# Patient Record
Sex: Female | Born: 1945
Health system: Southern US, Community
[De-identification: ages and names within clinical notes are randomized; demographics above are authoritative.]

## PROBLEM LIST (undated history)

## (undated) DIAGNOSIS — I10 Essential (primary) hypertension: Secondary | ICD-10-CM

## (undated) DIAGNOSIS — I350 Nonrheumatic aortic (valve) stenosis: Secondary | ICD-10-CM

## (undated) DIAGNOSIS — I509 Heart failure, unspecified: Secondary | ICD-10-CM

## (undated) DIAGNOSIS — K253 Acute gastric ulcer without hemorrhage or perforation: Secondary | ICD-10-CM

## (undated) DIAGNOSIS — E785 Hyperlipidemia, unspecified: Secondary | ICD-10-CM

## (undated) DIAGNOSIS — N200 Calculus of kidney: Secondary | ICD-10-CM

## (undated) DIAGNOSIS — Q233 Congenital mitral insufficiency: Secondary | ICD-10-CM

## (undated) DIAGNOSIS — Z95810 Presence of automatic (implantable) cardiac defibrillator: Secondary | ICD-10-CM

## (undated) HISTORY — DX: Hyperlipidemia, unspecified: E78.5

## (undated) HISTORY — DX: Essential (primary) hypertension: I10

## (undated) HISTORY — DX: Calculus of kidney: N20.0

## (undated) HISTORY — DX: Nonrheumatic aortic (valve) stenosis: I35.0

## (undated) HISTORY — PX: INSERT / REPLACE / REMOVE PACEMAKER: SUR710

## (undated) HISTORY — DX: Heart failure, unspecified: I50.9

## (undated) HISTORY — DX: Congenital mitral insufficiency: Q23.3

## (undated) HISTORY — PX: ABDOMINAL HYSTERECTOMY: SHX81

---

## 1977-04-11 HISTORY — PX: APPENDECTOMY: SHX54

## 1997-09-22 ENCOUNTER — Emergency Department (HOSPITAL_COMMUNITY): Admission: EM | Admit: 1997-09-22 | Discharge: 1997-09-22 | Payer: Self-pay | Admitting: Emergency Medicine

## 1997-10-29 ENCOUNTER — Encounter: Admission: RE | Admit: 1997-10-29 | Discharge: 1997-10-29 | Payer: Self-pay | Admitting: Internal Medicine

## 1997-11-12 ENCOUNTER — Emergency Department (HOSPITAL_COMMUNITY): Admission: EM | Admit: 1997-11-12 | Discharge: 1997-11-12 | Payer: Self-pay | Admitting: *Deleted

## 1997-11-19 ENCOUNTER — Encounter: Admission: RE | Admit: 1997-11-19 | Discharge: 1997-11-19 | Payer: Self-pay | Admitting: Hematology and Oncology

## 1997-11-20 ENCOUNTER — Encounter: Admission: RE | Admit: 1997-11-20 | Discharge: 1997-11-20 | Payer: Self-pay | Admitting: Hematology and Oncology

## 1997-11-26 ENCOUNTER — Encounter: Payer: Self-pay | Admitting: Gastroenterology

## 1997-11-26 ENCOUNTER — Ambulatory Visit (HOSPITAL_COMMUNITY): Admission: RE | Admit: 1997-11-26 | Discharge: 1997-11-26 | Payer: Self-pay | Admitting: Gastroenterology

## 1997-12-11 ENCOUNTER — Ambulatory Visit (HOSPITAL_COMMUNITY): Admission: RE | Admit: 1997-12-11 | Discharge: 1997-12-11 | Payer: Self-pay | Admitting: Cardiology

## 1998-01-05 ENCOUNTER — Ambulatory Visit (HOSPITAL_COMMUNITY): Admission: RE | Admit: 1998-01-05 | Discharge: 1998-01-05 | Payer: Self-pay | Admitting: Cardiology

## 1998-02-23 ENCOUNTER — Encounter: Payer: Self-pay | Admitting: Emergency Medicine

## 1998-02-23 ENCOUNTER — Inpatient Hospital Stay (HOSPITAL_COMMUNITY): Admission: EM | Admit: 1998-02-23 | Discharge: 1998-03-01 | Payer: Self-pay | Admitting: Emergency Medicine

## 1998-03-10 ENCOUNTER — Ambulatory Visit (HOSPITAL_COMMUNITY): Admission: RE | Admit: 1998-03-10 | Discharge: 1998-03-10 | Payer: Self-pay | Admitting: Cardiology

## 1998-03-16 ENCOUNTER — Emergency Department (HOSPITAL_COMMUNITY): Admission: EM | Admit: 1998-03-16 | Discharge: 1998-03-16 | Payer: Self-pay | Admitting: Emergency Medicine

## 1998-03-27 ENCOUNTER — Encounter: Admission: RE | Admit: 1998-03-27 | Discharge: 1998-06-25 | Payer: Self-pay | Admitting: Internal Medicine

## 1999-01-20 ENCOUNTER — Ambulatory Visit (HOSPITAL_COMMUNITY): Admission: RE | Admit: 1999-01-20 | Discharge: 1999-01-20 | Payer: Self-pay | Admitting: *Deleted

## 1999-08-19 ENCOUNTER — Ambulatory Visit (HOSPITAL_COMMUNITY): Admission: RE | Admit: 1999-08-19 | Discharge: 1999-08-19 | Payer: Self-pay | Admitting: *Deleted

## 2000-09-02 ENCOUNTER — Emergency Department (HOSPITAL_COMMUNITY): Admission: EM | Admit: 2000-09-02 | Discharge: 2000-09-02 | Payer: Self-pay | Admitting: Emergency Medicine

## 2000-09-02 ENCOUNTER — Encounter: Payer: Self-pay | Admitting: Emergency Medicine

## 2000-09-05 ENCOUNTER — Encounter: Admission: RE | Admit: 2000-09-05 | Discharge: 2000-09-05 | Payer: Self-pay | Admitting: Internal Medicine

## 2000-09-05 ENCOUNTER — Ambulatory Visit (HOSPITAL_COMMUNITY): Admission: RE | Admit: 2000-09-05 | Discharge: 2000-09-05 | Payer: Self-pay | Admitting: Internal Medicine

## 2000-10-03 ENCOUNTER — Encounter: Admission: RE | Admit: 2000-10-03 | Discharge: 2000-10-03 | Payer: Self-pay | Admitting: Internal Medicine

## 2000-10-27 ENCOUNTER — Encounter: Admission: RE | Admit: 2000-10-27 | Discharge: 2000-10-27 | Payer: Self-pay | Admitting: Internal Medicine

## 2001-02-10 ENCOUNTER — Emergency Department (HOSPITAL_COMMUNITY): Admission: EM | Admit: 2001-02-10 | Discharge: 2001-02-10 | Payer: Self-pay

## 2001-06-03 ENCOUNTER — Emergency Department (HOSPITAL_COMMUNITY): Admission: EM | Admit: 2001-06-03 | Discharge: 2001-06-03 | Payer: Self-pay | Admitting: *Deleted

## 2002-01-30 ENCOUNTER — Encounter: Admission: RE | Admit: 2002-01-30 | Discharge: 2002-01-30 | Payer: Self-pay | Admitting: Internal Medicine

## 2002-03-25 ENCOUNTER — Encounter: Admission: RE | Admit: 2002-03-25 | Discharge: 2002-03-25 | Payer: Self-pay | Admitting: Internal Medicine

## 2002-05-22 ENCOUNTER — Encounter: Payer: Self-pay | Admitting: Emergency Medicine

## 2002-05-23 ENCOUNTER — Encounter: Payer: Self-pay | Admitting: Cardiology

## 2002-05-23 ENCOUNTER — Inpatient Hospital Stay (HOSPITAL_COMMUNITY): Admission: EM | Admit: 2002-05-23 | Discharge: 2002-05-24 | Payer: Self-pay | Admitting: Emergency Medicine

## 2002-05-23 ENCOUNTER — Encounter: Payer: Self-pay | Admitting: Internal Medicine

## 2002-06-19 ENCOUNTER — Ambulatory Visit (HOSPITAL_COMMUNITY): Admission: RE | Admit: 2002-06-19 | Discharge: 2002-06-19 | Payer: Self-pay | Admitting: Internal Medicine

## 2002-06-19 ENCOUNTER — Encounter: Payer: Self-pay | Admitting: Internal Medicine

## 2002-06-19 ENCOUNTER — Encounter: Admission: RE | Admit: 2002-06-19 | Discharge: 2002-06-19 | Payer: Self-pay | Admitting: Internal Medicine

## 2002-06-28 ENCOUNTER — Ambulatory Visit (HOSPITAL_COMMUNITY): Admission: RE | Admit: 2002-06-28 | Discharge: 2002-06-28 | Payer: Self-pay | Admitting: Internal Medicine

## 2002-07-12 ENCOUNTER — Encounter: Admission: RE | Admit: 2002-07-12 | Discharge: 2002-07-12 | Payer: Self-pay | Admitting: Internal Medicine

## 2002-08-07 ENCOUNTER — Ambulatory Visit (HOSPITAL_COMMUNITY): Admission: RE | Admit: 2002-08-07 | Discharge: 2002-08-07 | Payer: Self-pay | Admitting: Cardiology

## 2002-08-07 ENCOUNTER — Encounter: Payer: Self-pay | Admitting: Cardiology

## 2002-08-14 ENCOUNTER — Ambulatory Visit (HOSPITAL_COMMUNITY): Admission: RE | Admit: 2002-08-14 | Discharge: 2002-08-14 | Payer: Self-pay | Admitting: *Deleted

## 2003-01-13 ENCOUNTER — Encounter: Admission: RE | Admit: 2003-01-13 | Discharge: 2003-01-13 | Payer: Self-pay | Admitting: Internal Medicine

## 2003-01-15 ENCOUNTER — Encounter: Payer: Self-pay | Admitting: Internal Medicine

## 2003-01-15 ENCOUNTER — Ambulatory Visit (HOSPITAL_COMMUNITY): Admission: RE | Admit: 2003-01-15 | Discharge: 2003-01-15 | Payer: Self-pay | Admitting: Internal Medicine

## 2003-01-27 ENCOUNTER — Encounter: Admission: RE | Admit: 2003-01-27 | Discharge: 2003-01-27 | Payer: Self-pay | Admitting: Internal Medicine

## 2003-04-12 HISTORY — PX: CARDIAC VALVE SURGERY: SHX40

## 2004-01-24 ENCOUNTER — Inpatient Hospital Stay (HOSPITAL_COMMUNITY): Admission: EM | Admit: 2004-01-24 | Discharge: 2004-01-30 | Payer: Self-pay | Admitting: Emergency Medicine

## 2004-02-17 ENCOUNTER — Ambulatory Visit: Payer: Self-pay | Admitting: *Deleted

## 2004-02-25 ENCOUNTER — Ambulatory Visit: Payer: Self-pay | Admitting: *Deleted

## 2004-02-25 ENCOUNTER — Ambulatory Visit: Payer: Self-pay

## 2004-03-05 ENCOUNTER — Ambulatory Visit: Payer: Self-pay | Admitting: Internal Medicine

## 2004-03-09 ENCOUNTER — Ambulatory Visit: Payer: Self-pay | Admitting: *Deleted

## 2004-03-19 ENCOUNTER — Ambulatory Visit: Payer: Self-pay | Admitting: Internal Medicine

## 2004-03-19 ENCOUNTER — Ambulatory Visit: Payer: Self-pay | Admitting: *Deleted

## 2004-04-01 ENCOUNTER — Ambulatory Visit: Payer: Self-pay | Admitting: Cardiology

## 2004-04-13 ENCOUNTER — Ambulatory Visit: Payer: Self-pay | Admitting: *Deleted

## 2004-04-22 ENCOUNTER — Ambulatory Visit: Payer: Self-pay | Admitting: Internal Medicine

## 2004-04-27 ENCOUNTER — Ambulatory Visit: Payer: Self-pay | Admitting: Internal Medicine

## 2004-04-28 ENCOUNTER — Ambulatory Visit: Payer: Self-pay

## 2004-05-07 ENCOUNTER — Ambulatory Visit: Payer: Self-pay | Admitting: Cardiology

## 2004-05-21 ENCOUNTER — Ambulatory Visit: Payer: Self-pay | Admitting: Cardiology

## 2004-05-27 ENCOUNTER — Ambulatory Visit: Payer: Self-pay

## 2004-05-27 ENCOUNTER — Ambulatory Visit: Payer: Self-pay | Admitting: *Deleted

## 2004-06-03 ENCOUNTER — Ambulatory Visit: Payer: Self-pay

## 2004-06-04 ENCOUNTER — Ambulatory Visit: Payer: Self-pay | Admitting: Cardiology

## 2004-06-17 ENCOUNTER — Ambulatory Visit: Payer: Self-pay | Admitting: *Deleted

## 2004-06-25 ENCOUNTER — Ambulatory Visit: Payer: Self-pay | Admitting: *Deleted

## 2004-06-25 ENCOUNTER — Ambulatory Visit: Payer: Self-pay | Admitting: Cardiology

## 2004-06-29 ENCOUNTER — Ambulatory Visit: Payer: Self-pay | Admitting: Internal Medicine

## 2004-07-13 ENCOUNTER — Ambulatory Visit: Payer: Self-pay | Admitting: Cardiology

## 2004-07-27 ENCOUNTER — Ambulatory Visit: Payer: Self-pay | Admitting: Cardiology

## 2004-08-03 ENCOUNTER — Ambulatory Visit: Payer: Self-pay | Admitting: *Deleted

## 2004-08-09 ENCOUNTER — Ambulatory Visit: Payer: Self-pay | Admitting: Internal Medicine

## 2004-08-17 ENCOUNTER — Ambulatory Visit: Payer: Self-pay | Admitting: Cardiology

## 2004-09-07 ENCOUNTER — Ambulatory Visit: Payer: Self-pay | Admitting: *Deleted

## 2004-09-20 ENCOUNTER — Ambulatory Visit: Payer: Self-pay | Admitting: Cardiology

## 2004-10-11 ENCOUNTER — Ambulatory Visit: Payer: Self-pay | Admitting: Cardiology

## 2004-10-20 ENCOUNTER — Ambulatory Visit: Payer: Self-pay | Admitting: Cardiology

## 2004-11-01 ENCOUNTER — Ambulatory Visit: Payer: Self-pay | Admitting: Cardiology

## 2004-11-15 ENCOUNTER — Ambulatory Visit: Payer: Self-pay | Admitting: Cardiology

## 2004-11-29 ENCOUNTER — Ambulatory Visit: Payer: Self-pay | Admitting: Cardiology

## 2004-12-10 ENCOUNTER — Ambulatory Visit: Payer: Self-pay | Admitting: Cardiology

## 2004-12-24 ENCOUNTER — Ambulatory Visit: Payer: Self-pay | Admitting: Cardiology

## 2005-01-11 ENCOUNTER — Ambulatory Visit: Payer: Self-pay | Admitting: Internal Medicine

## 2005-01-25 ENCOUNTER — Ambulatory Visit: Payer: Self-pay | Admitting: Cardiology

## 2005-02-17 ENCOUNTER — Ambulatory Visit: Payer: Self-pay | Admitting: Cardiology

## 2005-03-01 ENCOUNTER — Ambulatory Visit: Payer: Self-pay | Admitting: Cardiology

## 2005-03-31 ENCOUNTER — Ambulatory Visit: Payer: Self-pay | Admitting: Cardiology

## 2005-04-09 ENCOUNTER — Inpatient Hospital Stay (HOSPITAL_COMMUNITY): Admission: EM | Admit: 2005-04-09 | Discharge: 2005-04-15 | Payer: Self-pay | Admitting: Emergency Medicine

## 2005-04-09 ENCOUNTER — Ambulatory Visit: Payer: Self-pay | Admitting: Cardiology

## 2005-04-13 ENCOUNTER — Encounter: Payer: Self-pay | Admitting: Cardiology

## 2005-04-29 ENCOUNTER — Ambulatory Visit: Payer: Self-pay | Admitting: Cardiology

## 2005-05-13 ENCOUNTER — Ambulatory Visit: Payer: Self-pay | Admitting: Cardiology

## 2005-05-20 ENCOUNTER — Ambulatory Visit: Payer: Self-pay | Admitting: Internal Medicine

## 2005-06-01 ENCOUNTER — Encounter: Admission: RE | Admit: 2005-06-01 | Discharge: 2005-06-01 | Payer: Self-pay | Admitting: Pulmonary Disease

## 2005-06-03 ENCOUNTER — Ambulatory Visit: Payer: Self-pay | Admitting: Internal Medicine

## 2005-06-28 ENCOUNTER — Ambulatory Visit: Payer: Self-pay | Admitting: Cardiology

## 2005-07-13 ENCOUNTER — Ambulatory Visit: Payer: Self-pay | Admitting: Cardiology

## 2005-07-27 ENCOUNTER — Ambulatory Visit: Payer: Self-pay | Admitting: *Deleted

## 2005-08-10 ENCOUNTER — Ambulatory Visit: Payer: Self-pay | Admitting: Cardiology

## 2005-08-10 ENCOUNTER — Encounter: Payer: Self-pay | Admitting: Cardiology

## 2005-08-24 ENCOUNTER — Ambulatory Visit: Payer: Self-pay | Admitting: Cardiology

## 2005-09-14 ENCOUNTER — Ambulatory Visit: Payer: Self-pay | Admitting: Cardiology

## 2005-10-13 ENCOUNTER — Ambulatory Visit: Payer: Self-pay | Admitting: Cardiology

## 2005-10-27 ENCOUNTER — Ambulatory Visit: Payer: Self-pay | Admitting: Cardiology

## 2005-11-04 ENCOUNTER — Emergency Department (HOSPITAL_COMMUNITY): Admission: EM | Admit: 2005-11-04 | Discharge: 2005-11-04 | Payer: Self-pay | Admitting: Emergency Medicine

## 2005-11-14 ENCOUNTER — Ambulatory Visit: Payer: Self-pay | Admitting: Internal Medicine

## 2005-11-28 ENCOUNTER — Ambulatory Visit: Payer: Self-pay | Admitting: Internal Medicine

## 2005-12-06 ENCOUNTER — Ambulatory Visit: Payer: Self-pay | Admitting: *Deleted

## 2006-01-02 ENCOUNTER — Ambulatory Visit: Payer: Self-pay | Admitting: Cardiology

## 2006-01-17 ENCOUNTER — Ambulatory Visit: Payer: Self-pay | Admitting: Internal Medicine

## 2006-01-31 ENCOUNTER — Ambulatory Visit: Payer: Self-pay | Admitting: Internal Medicine

## 2006-02-27 ENCOUNTER — Ambulatory Visit: Payer: Self-pay | Admitting: Cardiology

## 2006-03-27 ENCOUNTER — Ambulatory Visit (HOSPITAL_COMMUNITY): Admission: RE | Admit: 2006-03-27 | Discharge: 2006-03-27 | Payer: Self-pay | Admitting: Pulmonary Disease

## 2006-03-27 ENCOUNTER — Ambulatory Visit: Payer: Self-pay | Admitting: Cardiology

## 2006-04-14 ENCOUNTER — Ambulatory Visit: Payer: Self-pay | Admitting: Cardiology

## 2006-05-15 ENCOUNTER — Ambulatory Visit: Payer: Self-pay | Admitting: Cardiology

## 2006-06-12 ENCOUNTER — Ambulatory Visit: Payer: Self-pay | Admitting: Cardiovascular Disease

## 2006-06-17 ENCOUNTER — Inpatient Hospital Stay (HOSPITAL_COMMUNITY): Admission: EM | Admit: 2006-06-17 | Discharge: 2006-06-22 | Payer: Self-pay | Admitting: Emergency Medicine

## 2006-06-17 ENCOUNTER — Ambulatory Visit: Payer: Self-pay | Admitting: Internal Medicine

## 2006-06-27 ENCOUNTER — Inpatient Hospital Stay (HOSPITAL_COMMUNITY): Admission: AD | Admit: 2006-06-27 | Discharge: 2006-07-03 | Payer: Self-pay | Admitting: Orthopaedic Surgery

## 2006-08-17 ENCOUNTER — Ambulatory Visit: Payer: Self-pay | Admitting: Cardiology

## 2006-09-07 ENCOUNTER — Ambulatory Visit: Payer: Self-pay | Admitting: Cardiology

## 2006-10-05 ENCOUNTER — Ambulatory Visit: Payer: Self-pay | Admitting: Internal Medicine

## 2006-11-02 ENCOUNTER — Ambulatory Visit: Payer: Self-pay | Admitting: Cardiology

## 2006-11-16 ENCOUNTER — Ambulatory Visit: Payer: Self-pay | Admitting: Cardiovascular Disease

## 2006-12-08 ENCOUNTER — Inpatient Hospital Stay (HOSPITAL_COMMUNITY): Admission: EM | Admit: 2006-12-08 | Discharge: 2006-12-17 | Payer: Self-pay | Admitting: Family Medicine

## 2006-12-08 ENCOUNTER — Ambulatory Visit: Payer: Self-pay | Admitting: Cardiology

## 2006-12-13 ENCOUNTER — Encounter: Payer: Self-pay | Admitting: Internal Medicine

## 2006-12-13 ENCOUNTER — Ambulatory Visit: Payer: Self-pay | Admitting: Cardiovascular Disease

## 2006-12-24 ENCOUNTER — Emergency Department (HOSPITAL_COMMUNITY): Admission: EM | Admit: 2006-12-24 | Discharge: 2006-12-25 | Payer: Self-pay | Admitting: Emergency Medicine

## 2006-12-26 ENCOUNTER — Emergency Department (HOSPITAL_COMMUNITY): Admission: EM | Admit: 2006-12-26 | Discharge: 2006-12-26 | Payer: Self-pay | Admitting: Emergency Medicine

## 2006-12-27 ENCOUNTER — Ambulatory Visit: Payer: Self-pay | Admitting: Internal Medicine

## 2007-01-04 ENCOUNTER — Ambulatory Visit: Payer: Self-pay | Admitting: Internal Medicine

## 2007-01-17 ENCOUNTER — Ambulatory Visit: Payer: Self-pay | Admitting: Internal Medicine

## 2007-02-02 ENCOUNTER — Ambulatory Visit: Payer: Self-pay | Admitting: Cardiology

## 2007-02-14 ENCOUNTER — Ambulatory Visit: Payer: Self-pay | Admitting: Cardiology

## 2007-03-01 ENCOUNTER — Ambulatory Visit: Payer: Self-pay | Admitting: Cardiology

## 2007-03-01 LAB — CONVERTED CEMR LAB
CO2: 30 meq/L (ref 19–32)
Calcium: 8.8 mg/dL (ref 8.4–10.5)
Chloride: 105 meq/L (ref 96–112)
GFR calc Af Amer: 130 mL/min
Glucose, Bld: 92 mg/dL (ref 70–99)

## 2007-03-14 ENCOUNTER — Ambulatory Visit: Payer: Self-pay | Admitting: Cardiology

## 2007-03-23 ENCOUNTER — Ambulatory Visit: Payer: Self-pay | Admitting: Internal Medicine

## 2007-03-28 ENCOUNTER — Ambulatory Visit: Payer: Self-pay | Admitting: Internal Medicine

## 2007-03-28 LAB — CONVERTED CEMR LAB
CO2: 32 meq/L (ref 19–32)
Calcium: 9 mg/dL (ref 8.4–10.5)
Chloride: 104 meq/L (ref 96–112)
Creatinine, Ser: 0.8 mg/dL (ref 0.4–1.2)
Glucose, Bld: 106 mg/dL — ABNORMAL HIGH (ref 70–99)

## 2007-04-12 HISTORY — PX: CARDIAC DEFIBRILLATOR PLACEMENT: SHX171

## 2007-04-18 ENCOUNTER — Ambulatory Visit: Payer: Self-pay | Admitting: Cardiovascular Disease

## 2007-05-16 ENCOUNTER — Ambulatory Visit: Payer: Self-pay | Admitting: Internal Medicine

## 2007-06-12 ENCOUNTER — Ambulatory Visit: Payer: Self-pay | Admitting: Internal Medicine

## 2007-07-05 ENCOUNTER — Ambulatory Visit: Payer: Self-pay | Admitting: Internal Medicine

## 2007-07-12 ENCOUNTER — Ambulatory Visit: Payer: Self-pay | Admitting: Internal Medicine

## 2007-07-18 ENCOUNTER — Ambulatory Visit: Payer: Self-pay | Admitting: Internal Medicine

## 2007-07-18 LAB — CONVERTED CEMR LAB
BUN: 13 mg/dL (ref 6–23)
Basophils Relative: 0.2 % (ref 0.0–1.0)
Chloride: 103 meq/L (ref 96–112)
Creatinine, Ser: 0.9 mg/dL (ref 0.4–1.2)
Eosinophils Absolute: 0.1 10*3/uL (ref 0.0–0.7)
Eosinophils Relative: 1.1 % (ref 0.0–5.0)
GFR calc Af Amer: 81 mL/min
GFR calc non Af Amer: 67 mL/min
HCT: 34.7 % — ABNORMAL LOW (ref 36.0–46.0)
Hemoglobin: 11.1 g/dL — ABNORMAL LOW (ref 12.0–15.0)
MCV: 93.2 fL (ref 78.0–100.0)
Monocytes Absolute: 0.6 10*3/uL (ref 0.1–1.0)
RBC: 3.72 M/uL — ABNORMAL LOW (ref 3.87–5.11)
WBC: 9.7 10*3/uL (ref 4.5–10.5)

## 2007-07-20 ENCOUNTER — Ambulatory Visit (HOSPITAL_COMMUNITY): Admission: RE | Admit: 2007-07-20 | Discharge: 2007-07-21 | Payer: Self-pay | Admitting: Internal Medicine

## 2007-07-20 ENCOUNTER — Ambulatory Visit: Payer: Self-pay | Admitting: Internal Medicine

## 2007-07-27 ENCOUNTER — Ambulatory Visit: Payer: Self-pay | Admitting: Internal Medicine

## 2007-08-02 ENCOUNTER — Ambulatory Visit: Payer: Self-pay

## 2007-08-17 ENCOUNTER — Ambulatory Visit: Payer: Self-pay | Admitting: Cardiology

## 2007-09-07 ENCOUNTER — Ambulatory Visit: Payer: Self-pay | Admitting: Cardiology

## 2007-09-21 ENCOUNTER — Ambulatory Visit: Payer: Self-pay | Admitting: Cardiovascular Disease

## 2007-09-27 ENCOUNTER — Emergency Department (HOSPITAL_COMMUNITY): Admission: EM | Admit: 2007-09-27 | Discharge: 2007-09-27 | Payer: Self-pay | Admitting: Emergency Medicine

## 2007-10-19 ENCOUNTER — Ambulatory Visit: Payer: Self-pay | Admitting: Cardiovascular Disease

## 2007-11-16 ENCOUNTER — Ambulatory Visit: Payer: Self-pay | Admitting: Cardiology

## 2007-11-26 ENCOUNTER — Ambulatory Visit: Payer: Self-pay | Admitting: Cardiology

## 2007-12-19 ENCOUNTER — Ambulatory Visit: Payer: Self-pay | Admitting: Internal Medicine

## 2007-12-19 ENCOUNTER — Ambulatory Visit: Payer: Self-pay

## 2007-12-19 ENCOUNTER — Encounter: Payer: Self-pay | Admitting: Internal Medicine

## 2008-01-02 ENCOUNTER — Ambulatory Visit: Payer: Self-pay | Admitting: Cardiology

## 2008-01-23 ENCOUNTER — Ambulatory Visit: Payer: Self-pay | Admitting: Cardiology

## 2008-01-24 DIAGNOSIS — I059 Rheumatic mitral valve disease, unspecified: Secondary | ICD-10-CM

## 2008-01-24 DIAGNOSIS — I10 Essential (primary) hypertension: Secondary | ICD-10-CM

## 2008-01-24 DIAGNOSIS — I5022 Chronic systolic (congestive) heart failure: Secondary | ICD-10-CM

## 2008-02-11 ENCOUNTER — Ambulatory Visit: Payer: Self-pay | Admitting: Cardiovascular Disease

## 2008-03-10 ENCOUNTER — Ambulatory Visit: Payer: Self-pay | Admitting: Cardiovascular Disease

## 2008-03-18 ENCOUNTER — Ambulatory Visit: Payer: Self-pay | Admitting: Internal Medicine

## 2008-04-08 ENCOUNTER — Ambulatory Visit: Payer: Self-pay | Admitting: Cardiovascular Disease

## 2008-04-30 ENCOUNTER — Encounter: Admission: RE | Admit: 2008-04-30 | Discharge: 2008-04-30 | Payer: Self-pay | Admitting: Pulmonary Disease

## 2008-05-06 ENCOUNTER — Ambulatory Visit: Payer: Self-pay | Admitting: Cardiology

## 2008-05-20 ENCOUNTER — Ambulatory Visit: Payer: Self-pay | Admitting: Cardiology

## 2008-06-03 ENCOUNTER — Ambulatory Visit: Payer: Self-pay | Admitting: Cardiovascular Disease

## 2008-06-17 ENCOUNTER — Encounter: Payer: Self-pay | Admitting: Internal Medicine

## 2008-06-20 ENCOUNTER — Ambulatory Visit: Payer: Self-pay | Admitting: Cardiovascular Disease

## 2008-07-16 ENCOUNTER — Ambulatory Visit: Payer: Self-pay

## 2008-07-30 ENCOUNTER — Ambulatory Visit: Payer: Self-pay | Admitting: Internal Medicine

## 2008-07-30 ENCOUNTER — Encounter: Payer: Self-pay | Admitting: Internal Medicine

## 2008-07-30 DIAGNOSIS — I359 Nonrheumatic aortic valve disorder, unspecified: Secondary | ICD-10-CM | POA: Insufficient documentation

## 2008-08-06 ENCOUNTER — Ambulatory Visit: Payer: Self-pay | Admitting: Cardiology

## 2008-08-22 ENCOUNTER — Ambulatory Visit: Payer: Self-pay | Admitting: Cardiology

## 2008-09-09 ENCOUNTER — Encounter: Payer: Self-pay | Admitting: *Deleted

## 2008-10-09 ENCOUNTER — Ambulatory Visit: Payer: Self-pay | Admitting: Cardiology

## 2008-10-09 ENCOUNTER — Encounter: Payer: Self-pay | Admitting: Internal Medicine

## 2008-10-09 ENCOUNTER — Ambulatory Visit: Payer: Self-pay

## 2008-10-09 LAB — CONVERTED CEMR LAB: POC INR: 2.7

## 2008-10-15 ENCOUNTER — Encounter: Payer: Self-pay | Admitting: *Deleted

## 2008-10-28 ENCOUNTER — Encounter: Payer: Self-pay | Admitting: Internal Medicine

## 2008-11-06 ENCOUNTER — Ambulatory Visit: Payer: Self-pay | Admitting: Cardiovascular Disease

## 2008-11-06 LAB — CONVERTED CEMR LAB
POC INR: 3.1
Prothrombin Time: 21.4 s

## 2008-12-04 ENCOUNTER — Ambulatory Visit: Payer: Self-pay | Admitting: Cardiovascular Disease

## 2008-12-04 LAB — CONVERTED CEMR LAB: POC INR: 3.8

## 2008-12-29 ENCOUNTER — Ambulatory Visit: Payer: Self-pay | Admitting: Cardiology

## 2008-12-29 ENCOUNTER — Encounter: Payer: Self-pay | Admitting: Internal Medicine

## 2008-12-29 LAB — CONVERTED CEMR LAB: Prothrombin Time: 96.8 s (ref 9.1–11.7)

## 2009-01-01 ENCOUNTER — Ambulatory Visit: Payer: Self-pay | Admitting: Internal Medicine

## 2009-01-08 ENCOUNTER — Ambulatory Visit: Payer: Self-pay | Admitting: Internal Medicine

## 2009-01-12 ENCOUNTER — Ambulatory Visit: Payer: Self-pay

## 2009-01-12 ENCOUNTER — Encounter: Payer: Self-pay | Admitting: Internal Medicine

## 2009-01-16 ENCOUNTER — Ambulatory Visit: Payer: Self-pay | Admitting: Internal Medicine

## 2009-01-16 LAB — CONVERTED CEMR LAB: POC INR: 4.4

## 2009-01-23 ENCOUNTER — Ambulatory Visit: Payer: Self-pay | Admitting: Internal Medicine

## 2009-02-13 ENCOUNTER — Ambulatory Visit: Payer: Self-pay | Admitting: Cardiovascular Disease

## 2009-02-13 LAB — CONVERTED CEMR LAB: POC INR: 3.4

## 2009-03-13 ENCOUNTER — Ambulatory Visit: Payer: Self-pay | Admitting: Cardiovascular Disease

## 2009-03-13 LAB — CONVERTED CEMR LAB: POC INR: 2.3

## 2009-04-02 ENCOUNTER — Ambulatory Visit: Payer: Self-pay | Admitting: Cardiology

## 2009-04-02 LAB — CONVERTED CEMR LAB: POC INR: 2

## 2009-04-14 ENCOUNTER — Ambulatory Visit: Payer: Self-pay | Admitting: Internal Medicine

## 2009-04-14 ENCOUNTER — Ambulatory Visit: Payer: Self-pay | Admitting: Cardiology

## 2009-04-28 ENCOUNTER — Ambulatory Visit: Payer: Self-pay | Admitting: Cardiology

## 2009-05-27 ENCOUNTER — Ambulatory Visit: Payer: Self-pay | Admitting: Internal Medicine

## 2009-06-09 ENCOUNTER — Encounter (INDEPENDENT_AMBULATORY_CARE_PROVIDER_SITE_OTHER): Payer: Self-pay | Admitting: *Deleted

## 2009-06-23 ENCOUNTER — Ambulatory Visit (HOSPITAL_COMMUNITY): Admission: RE | Admit: 2009-06-23 | Discharge: 2009-06-23 | Payer: Self-pay | Admitting: Pulmonary Disease

## 2009-06-24 ENCOUNTER — Ambulatory Visit: Payer: Self-pay | Admitting: Internal Medicine

## 2009-06-24 LAB — CONVERTED CEMR LAB: POC INR: 1.7

## 2009-06-29 ENCOUNTER — Ambulatory Visit (HOSPITAL_COMMUNITY): Admission: RE | Admit: 2009-06-29 | Discharge: 2009-06-29 | Payer: Self-pay | Admitting: Pulmonary Disease

## 2009-07-08 ENCOUNTER — Ambulatory Visit: Payer: Self-pay | Admitting: Cardiovascular Disease

## 2009-07-10 ENCOUNTER — Encounter (INDEPENDENT_AMBULATORY_CARE_PROVIDER_SITE_OTHER): Payer: Self-pay | Admitting: *Deleted

## 2009-07-15 ENCOUNTER — Ambulatory Visit: Payer: Self-pay

## 2009-07-15 ENCOUNTER — Encounter: Payer: Self-pay | Admitting: Internal Medicine

## 2009-07-17 ENCOUNTER — Ambulatory Visit: Payer: Self-pay | Admitting: Cardiology

## 2009-08-03 ENCOUNTER — Ambulatory Visit: Payer: Self-pay | Admitting: Cardiovascular Disease

## 2009-08-18 ENCOUNTER — Ambulatory Visit: Payer: Self-pay | Admitting: Internal Medicine

## 2009-08-18 DIAGNOSIS — I5023 Acute on chronic systolic (congestive) heart failure: Secondary | ICD-10-CM | POA: Insufficient documentation

## 2009-08-24 ENCOUNTER — Ambulatory Visit: Payer: Self-pay | Admitting: Cardiovascular Disease

## 2009-08-24 LAB — CONVERTED CEMR LAB: POC INR: 2.9

## 2009-09-04 ENCOUNTER — Encounter: Payer: Self-pay | Admitting: Internal Medicine

## 2009-09-04 ENCOUNTER — Ambulatory Visit: Payer: Self-pay | Admitting: Cardiovascular Disease

## 2009-09-04 ENCOUNTER — Ambulatory Visit: Payer: Self-pay

## 2009-09-04 ENCOUNTER — Ambulatory Visit (HOSPITAL_COMMUNITY): Admission: RE | Admit: 2009-09-04 | Discharge: 2009-09-04 | Payer: Self-pay | Admitting: Internal Medicine

## 2009-09-04 ENCOUNTER — Ambulatory Visit: Payer: Self-pay | Admitting: Internal Medicine

## 2009-09-09 ENCOUNTER — Ambulatory Visit: Payer: Self-pay | Admitting: Internal Medicine

## 2009-09-09 ENCOUNTER — Ambulatory Visit: Payer: Self-pay | Admitting: Cardiology

## 2009-09-09 LAB — CONVERTED CEMR LAB
CO2: 30 meq/L (ref 19–32)
Chloride: 101 meq/L (ref 96–112)
Creatinine, Ser: 0.9 mg/dL (ref 0.4–1.2)
POC INR: 3
Potassium: 4 meq/L (ref 3.5–5.1)
Sodium: 141 meq/L (ref 135–145)

## 2009-09-23 ENCOUNTER — Ambulatory Visit: Payer: Self-pay | Admitting: Internal Medicine

## 2009-09-28 LAB — CONVERTED CEMR LAB
CO2: 31 meq/L (ref 19–32)
Calcium: 9.5 mg/dL (ref 8.4–10.5)
Chloride: 105 meq/L (ref 96–112)
GFR calc non Af Amer: 90.97 mL/min (ref >60)
Glucose, Bld: 150 mg/dL — ABNORMAL HIGH (ref 70–99)
Potassium: 4.6 meq/L (ref 3.5–5.1)
Pro B Natriuretic peptide (BNP): 38 pg/mL (ref 0.0–100.0)
Sodium: 143 meq/L (ref 135–145)

## 2009-09-29 ENCOUNTER — Ambulatory Visit (HOSPITAL_COMMUNITY): Admission: RE | Admit: 2009-09-29 | Discharge: 2009-09-29 | Payer: Self-pay | Admitting: Internal Medicine

## 2009-09-29 ENCOUNTER — Ambulatory Visit: Payer: Self-pay | Admitting: Internal Medicine

## 2009-10-07 ENCOUNTER — Ambulatory Visit: Payer: Self-pay | Admitting: Cardiology

## 2009-10-15 ENCOUNTER — Ambulatory Visit: Payer: Self-pay | Admitting: Internal Medicine

## 2009-10-15 DIAGNOSIS — R0902 Hypoxemia: Secondary | ICD-10-CM | POA: Insufficient documentation

## 2009-10-19 ENCOUNTER — Ambulatory Visit: Payer: Self-pay | Admitting: Internal Medicine

## 2009-10-20 ENCOUNTER — Encounter: Payer: Self-pay | Admitting: Internal Medicine

## 2009-10-21 ENCOUNTER — Ambulatory Visit: Payer: Self-pay | Admitting: Internal Medicine

## 2009-10-30 ENCOUNTER — Telehealth: Payer: Self-pay | Admitting: Internal Medicine

## 2009-11-03 ENCOUNTER — Ambulatory Visit: Payer: Self-pay | Admitting: Cardiology

## 2009-11-03 ENCOUNTER — Ambulatory Visit: Payer: Self-pay | Admitting: Internal Medicine

## 2009-11-19 ENCOUNTER — Ambulatory Visit: Payer: Self-pay | Admitting: Pulmonary Disease

## 2009-11-19 DIAGNOSIS — J449 Chronic obstructive pulmonary disease, unspecified: Secondary | ICD-10-CM

## 2009-11-19 DIAGNOSIS — J4489 Other specified chronic obstructive pulmonary disease: Secondary | ICD-10-CM | POA: Insufficient documentation

## 2009-11-24 ENCOUNTER — Ambulatory Visit: Payer: Self-pay | Admitting: Cardiology

## 2009-12-22 ENCOUNTER — Ambulatory Visit: Payer: Self-pay | Admitting: Cardiovascular Disease

## 2010-01-08 ENCOUNTER — Emergency Department (HOSPITAL_COMMUNITY): Admission: EM | Admit: 2010-01-08 | Discharge: 2010-01-09 | Payer: Self-pay | Admitting: Emergency Medicine

## 2010-01-12 ENCOUNTER — Ambulatory Visit: Payer: Self-pay | Admitting: Internal Medicine

## 2010-02-01 ENCOUNTER — Encounter: Payer: Self-pay | Admitting: Internal Medicine

## 2010-02-01 ENCOUNTER — Ambulatory Visit: Payer: Self-pay

## 2010-02-09 ENCOUNTER — Ambulatory Visit: Payer: Self-pay | Admitting: Cardiovascular Disease

## 2010-02-09 LAB — CONVERTED CEMR LAB: POC INR: 3.2

## 2010-02-23 ENCOUNTER — Ambulatory Visit: Payer: Self-pay | Admitting: Internal Medicine

## 2010-03-09 ENCOUNTER — Ambulatory Visit: Payer: Self-pay | Admitting: Cardiology

## 2010-03-09 LAB — CONVERTED CEMR LAB: POC INR: 2.5

## 2010-04-09 ENCOUNTER — Ambulatory Visit: Payer: Self-pay | Admitting: Cardiovascular Disease

## 2010-04-09 LAB — CONVERTED CEMR LAB: POC INR: 2.3

## 2010-04-26 ENCOUNTER — Ambulatory Visit (HOSPITAL_COMMUNITY)
Admission: RE | Admit: 2010-04-26 | Discharge: 2010-04-26 | Payer: Self-pay | Source: Home / Self Care | Attending: Pulmonary Disease | Admitting: Pulmonary Disease

## 2010-04-28 ENCOUNTER — Ambulatory Visit (HOSPITAL_COMMUNITY)
Admission: RE | Admit: 2010-04-28 | Discharge: 2010-04-28 | Payer: Self-pay | Source: Home / Self Care | Attending: Pulmonary Disease | Admitting: Pulmonary Disease

## 2010-04-30 ENCOUNTER — Ambulatory Visit: Admission: RE | Admit: 2010-04-30 | Discharge: 2010-04-30 | Payer: Self-pay | Source: Home / Self Care

## 2010-05-02 ENCOUNTER — Encounter: Payer: Self-pay | Admitting: Pulmonary Disease

## 2010-05-06 ENCOUNTER — Ambulatory Visit: Admission: RE | Admit: 2010-05-06 | Discharge: 2010-05-06 | Payer: Self-pay | Source: Home / Self Care

## 2010-05-06 ENCOUNTER — Encounter: Payer: Self-pay | Admitting: Internal Medicine

## 2010-05-12 ENCOUNTER — Emergency Department (HOSPITAL_COMMUNITY): Payer: No Typology Code available for payment source

## 2010-05-12 ENCOUNTER — Emergency Department (HOSPITAL_COMMUNITY)
Admission: EM | Admit: 2010-05-12 | Discharge: 2010-05-12 | Disposition: A | Discharge status: Home / Self Care | Payer: No Typology Code available for payment source | Attending: Emergency Medicine | Admitting: Emergency Medicine

## 2010-05-12 DIAGNOSIS — R6883 Chills (without fever): Secondary | ICD-10-CM | POA: Insufficient documentation

## 2010-05-12 DIAGNOSIS — R05 Cough: Secondary | ICD-10-CM | POA: Insufficient documentation

## 2010-05-12 DIAGNOSIS — R6889 Other general symptoms and signs: Secondary | ICD-10-CM | POA: Insufficient documentation

## 2010-05-12 DIAGNOSIS — R1110 Vomiting, unspecified: Secondary | ICD-10-CM | POA: Insufficient documentation

## 2010-05-12 DIAGNOSIS — B9789 Other viral agents as the cause of diseases classified elsewhere: Secondary | ICD-10-CM | POA: Insufficient documentation

## 2010-05-12 DIAGNOSIS — R059 Cough, unspecified: Secondary | ICD-10-CM | POA: Insufficient documentation

## 2010-05-13 NOTE — Medication Information (Signed)
Summary: rov/ewj  Anticoagulant Therapy  Managed by: Weston Brass, PharmD Referring MD: Charlton Haws MD PCP: Corine Shelter Supervising MD: Myrtis Ser MD, Tinnie Gens Indication 1: Mitral Valve Replacement (ICD-V43.3) Indication 2: Mitral Valve Disorder (ICD-424.0) Lab Used: [facility] Site: [facility] INR POC 2.5 INR RANGE 2.5 - 3.5  Dietary changes: no    Health status changes: no    Bleeding/hemorrhagic complications: no    Recent/future hospitalizations: no    Any changes in medication regimen? no    Recent/future dental: no  Any missed doses?: no       Is patient compliant with meds? yes       Allergies: No Known Drug Allergies  Anticoagulation Management History:      The patient is taking warfarin and comes in today for a routine follow up visit.  Positive risk factors for bleeding include an age of 67 years or older.  The bleeding index is 'intermediate risk'.  Positive CHADS2 values include History of CHF and History of HTN.  Negative CHADS2 values include Age > 67 years old.  The start date was 11/29/2003.  Her last INR was 9.5 ratio.  Anticoagulation responsible provider: Myrtis Ser MD, Tinnie Gens.  INR POC: 2.5.  Cuvette Lot#: [id number].  Exp: 03/2011.    Anticoagulation Management Assessment/Plan:      The patient's current anticoagulation dose is Warfarin sodium 10 mg tabs: Take as directed by coumadin clinic..  The target INR is 2.5 - 3.5.  The next INR is due 04/09/2010.  Anticoagulation instructions were given to patient.  Results were reviewed/authorized by Weston Brass, PharmD.  She was notified by Hoy Register, PharmD Candidate.         Prior Anticoagulation Instructions: INR 3.2  Continue on same dosage 1 tablet daily except 1/2 tablet on Tuesdays, Thursdays, and Saturdays.  Recheck in 4 weeks.    Current Anticoagulation Instructions: INR 2.5 Continue previous dose of 1 tablet everyday except 0.5 tablet on Tuesday, Thursday, and Saturday Recheck INR in 4 weeks

## 2010-05-13 NOTE — Assessment & Plan Note (Signed)
Summary: pc2   Primary Provider:  Corine Shelter   History of Present Illness: [name] is a very pleasant 65 year old woman with a history of congestive heart failure, secondary to nonischemic cardiomyopathy, ejection fraction around 25% s/p St Jude single chamber ICD.    She also has a history of mitral valve disease and status post mitral valve replacement with a St. Jude valve in 2005.    She has no complaints today, specifically without edema , sob, or chest pain  Current Medications (verified): 1)  Carvedilol 25 Mg Tabs (Carvedilol) .... Take 1 Tablet By Mouth Twice A Day 2)  Quinapril Hcl 40 Mg Tabs (Quinapril Hcl) .... Take 1 Tablet By Mouth Once A Day 3)  Digoxin 0.125 Mg Tabs (Digoxin) .Marland Kitchen.. 1 Once Daily 4)  Metformin Hcl 1000 Mg Tabs (Metformin Hcl) .Marland Kitchen.. 1 Two Times A Day 5)  Warfarin Sodium 10 Mg Tabs (Warfarin Sodium) .... Take As Directed By Coumadin Clinic. 6)  Furosemide 40 Mg Tabs (Furosemide) .... Take One Tablet By Mouth Daily As Needed 7)  Hydrocodone-Acetaminophen 5-500 Mg Tabs (Hydrocodone-Acetaminophen) .... As Needed 8)  Lipitor 80 Mg Tabs (Atorvastatin Calcium) .... Take 1 Tablet By Mouth Once A Day 9)  Trilipix 135 Mg Cpdr (Choline Fenofibrate) .... Take 1 Tablet By Mouth Once A Day 10)  Spironolactone 25 Mg Tabs (Spironolactone) .... Take1/2 Tablet By Mouth Daily 11)  Fish Oil 1000 Mg Caps (Omega-3 Fatty Acids) .... Take 1 Capsule By Mouth Once A Day  Allergies (verified): No Known Drug Allergies  Past History:  Past Medical History: Last updated: 09/09/2009 Congestive Heart Failure     --Non-ischemic CM EF 30-35% by echo 9/09 (Previous 25%)     -- s/p St. Jude single chamber ICD 4/09     --Minimal CAD by cath 2004 LAD 20% LCX 20% Ramus 30% RCA nl     --echo 5/11  EF 20-25% Severe MR s/p St. Jude mechanical MVR by Gasper Lloyd 2005 Aortic Stenosis (mild) mean gradient echo 9/09 Diabetes Type 2 Kidney  Stones Hyperlipidemia Hypertension  Past Surgical History: Last updated: 04/10/2009 Appendectomy AICD implantation-2009  Social History: Last updated: 04/10/2009 Retired  Tobacco Use - Former. -remote hx  Vital Signs:  Patient profile:   65 year old female Height:      63 inches Weight:      178 pounds BMI:     31.65 Pulse rate:   88 / minute Pulse rhythm:   regular BP sitting:   155 / 88  (left arm) Cuff size:   large  Vitals Entered By: Judithe Modest CMA (November 03, 2009 12:00 PM)  Physical Exam  General:  The patient was alert and oriented in no acute distress.    HEENT Normal Neck: JVP flat carotids were brisk. no bruits Lungs were clear.  PMI laterally displaced Heart sounds were regular with mechanical S1 with 2/6 systolic murmur at RSB  Abdomen was soft with active bowel sounds. There is no clubbing cyanosis or edema. Skin Warm and dry Neuro  oriented x 3  cranial nerves II-XII grossly intact. moves all 4 extremities without difficulty.    ICD Specifications Following MD:  Sherryl Manges, MD     Referring MD:  [facility] ICD Vendor:  St Jude     ICD Model Number:  [id number]     ICD Serial Number:  [id number] ICD DOI:  07/20/2007     ICD Implanting MD:  Sherryl Manges, MD  Lead 1:  Location: [facility]     DOI: 07/20/2007     Model #: 7120     Serial #: [id number]     Status: active  Indications::  NICM   ICD Follow Up Remote Check?  No Battery Voltage:  3.20 V     Charge Time:  11.3 seconds     Battery Est. Longevity:  7.3 years Underlying rhythm:  SR ICD Dependent:  No       ICD Device Measurements Right Ventricle:  Amplitude: 12 mV, Impedance: 530 ohms, Threshold: 0.5 V at 0.5 msec Shock Impedance: 48 ohms   Episodes Coumadin:  Yes Shock:  0     ATP:  0     Nonsustained:  0     Ventricular Pacing:  <1%  Brady Parameters Mode VVI     Lower Rate Limit:  40      Tachy Zones VF:  240     VT:  210     VT1:  171     Next Cardiology Appt Due:  01/09/2010 Tech  Comments:  No parameter changes.  Device function normal.  Checked by industry.  ROV 3 months clinic. Altha Harm, LPN  November 03, 2009 12:09 PM   Impression & Recommendations:  Problem # 1:  IMPLANTATION OF DEFIBRILLATOR, ST JUDE CURRENT 1207 (ICD-V45.02) Device parameters and data were reviewed and no changes were made  Problem # 2:  SYSTOLIC HEART FAILURE, CHRONIC (ICD-428.22) stable Her updated medication list for this problem includes:    Carvedilol 25 Mg Tabs (Carvedilol) .Marland Kitchen... Take 1 tablet by mouth twice a day    Quinapril Hcl 40 Mg Tabs (Quinapril hcl) .Marland Kitchen... Take 1 tablet by mouth once a day    Digoxin 0.125 Mg Tabs (Digoxin) .Marland Kitchen... 1 once daily    Warfarin Sodium 10 Mg Tabs (Warfarin sodium) .Marland Kitchen... Take as directed by coumadin clinic.    Furosemide 40 Mg Tabs (Furosemide) .Marland Kitchen... Take one tablet by mouth daily as needed    Spironolactone 25 Mg Tabs (Spironolactone) .Marland Kitchen... Take1/2 tablet by mouth daily  Problem # 3:  MITRAL VALVE DISORDERS (ICD-424.0) stable cardiomyopathy Her updated medication list for this problem includes:    Carvedilol 25 Mg Tabs (Carvedilol) .Marland Kitchen... Take 1 tablet by mouth twice a day    Quinapril Hcl 40 Mg Tabs (Quinapril hcl) .Marland Kitchen... Take 1 tablet by mouth once a day    Digoxin 0.125 Mg Tabs (Digoxin) .Marland Kitchen... 1 once daily    Furosemide 40 Mg Tabs (Furosemide) .Marland Kitchen... Take one tablet by mouth daily as needed    Spironolactone 25 Mg Tabs (Spironolactone) .Marland Kitchen... Take1/2 tablet by mouth daily  Patient Instructions: 1)  Your physician recommends that you schedule a follow-up appointment in: 3 months with device clinic.

## 2010-05-13 NOTE — Medication Information (Signed)
Summary: rov/sel  Anticoagulant Therapy  Managed by: Cloyde Reams, RN, BSN Referring MD: Charlton Haws MD PCP: Corine Shelter Supervising MD: Clifton James MD, Cristal Deer Indication 1: Mitral Valve Replacement (ICD-V43.3) Indication 2: Mitral Valve Disorder (ICD-424.0) Lab Used: [facility] Site: [facility] INR POC 3.2 INR RANGE 2.5 - 3.5  Dietary changes: no    Health status changes: no    Bleeding/hemorrhagic complications: no    Recent/future hospitalizations: no    Any changes in medication regimen? no    Recent/future dental: no  Any missed doses?: no       Is patient compliant with meds? yes       Allergies: No Known Drug Allergies  Anticoagulation Management History:      The patient is taking warfarin and comes in today for a routine follow up visit.  Positive risk factors for bleeding include an age of 67 years or older.  The bleeding index is 'intermediate risk'.  Positive CHADS2 values include History of CHF and History of HTN.  Negative CHADS2 values include Age > 55 years old.  The start date was 11/29/2003.  Her last INR was 9.5 ratio.  Anticoagulation responsible provider: Clifton James MD, Cristal Deer.  INR POC: 3.2.  Cuvette Lot#: [id number].  Exp: 02/2011.    Anticoagulation Management Assessment/Plan:      The patient's current anticoagulation dose is Warfarin sodium 10 mg tabs: Take as directed by coumadin clinic..  The target INR is 2.5 - 3.5.  The next INR is due 03/09/2010.  Anticoagulation instructions were given to patient.  Results were reviewed/authorized by Cloyde Reams, RN, BSN.  She was notified by Cloyde Reams RN.         Prior Anticoagulation Instructions: INR 2.9  Patient will continue to take 1 tablet everyday except 1/2 tablet on Tuesday, Thursday, and Saturday. Recheck in 4 weeks.  Current Anticoagulation Instructions: INR 3.2  Continue on same dosage 1 tablet daily except 1/2 tablet on Tuesdays, Thursdays, and Saturdays.  Recheck in 4  weeks.

## 2010-05-13 NOTE — Medication Information (Signed)
Summary: rov/eac  Anticoagulant Therapy  Managed by: Bethena Midget, RN, BSN Referring MD: Charlton Haws MD PCP: Corine Shelter Supervising MD: Daleen Squibb MD, Maisie Fus Indication 1: Mitral Valve Replacement (ICD-V43.3) Indication 2: Mitral Valve Disorder (ICD-424.0) Lab Used: [facility] Site: [facility] INR POC 2.2 INR RANGE 2.5 - 3.5  Dietary changes: no    Health status changes: no    Bleeding/hemorrhagic complications: no    Recent/future hospitalizations: no    Any changes in medication regimen? no    Recent/future dental: no  Any missed doses?: no       Is patient compliant with meds? yes      Comments: Pt. was taking dose as it was printed out on her sheet which was 5mg s daily except 10mg s on MWF.   Allergies: No Known Drug Allergies  Anticoagulation Management History:      The patient is taking warfarin and comes in today for a routine follow up visit.  Positive risk factors for bleeding include an age of 19 years or older.  The bleeding index is 'intermediate risk'.  Positive CHADS2 values include History of CHF and History of HTN.  Negative CHADS2 values include Age > 10 years old.  The start date was 11/29/2003.  Her last INR was 9.5 ratio.  Anticoagulation responsible provider: Daleen Squibb MD, Maisie Fus.  INR POC: 2.2.  Cuvette Lot#: [id number].  Exp: 08/2010.    Anticoagulation Management Assessment/Plan:      The patient's current anticoagulation dose is Warfarin sodium 10 mg tabs: Take as directed by coumadin clinic..  The target INR is 2.5 - 3.5.  The next INR is due 07/31/2009.  Anticoagulation instructions were given to patient.  Results were reviewed/authorized by Bethena Midget, RN, BSN.  She was notified by Bethena Midget, RN, BSN.         Prior Anticoagulation Instructions: INR 1.3  Take 2 tablets today.  Then start new dosing schedule of 1/2 tablet on Tuesday, Thursday, and Saturday, and 1 tablet all other days.  Return to clinic in 10 days.    Current Anticoagulation  Instructions: INR 2.2 Change dose to 10mg s on Mondays, Wednesdays, Fridays and Saturdays and 5mg s on Tuesdays, Thursdays and Sundays. Recheck in 2 weeks.

## 2010-05-13 NOTE — Cardiovascular Report (Signed)
Summary: Office Visit   Office Visit   Imported By: Roderic Ovens 07/29/2009 16:22:51  _____________________________________________________________________  External Attachment:    Type:   Image     Comment:   External Document

## 2010-05-13 NOTE — Medication Information (Signed)
Summary: rov/kh  Anticoagulant Therapy  Managed by: Tammy Sours, PharmD Referring MD: Charlton Haws MD PCP: Corine Shelter Supervising MD: Clifton James MD, Cristal Deer Indication 1: Mitral Valve Replacement (ICD-V43.3) Indication 2: Mitral Valve Disorder (ICD-424.0) Lab [facility]  Site: [facility] INR POC 2.3 INR RANGE 2.5 - 3.5  Dietary changes: no    Health status changes: no    Bleeding/hemorrhagic complications: no    Recent/future hospitalizations: no    Any changes in medication regimen? no    Recent/future dental: no  Any missed doses?: no       Is patient compliant with meds? yes       Allergies: No Known Drug Allergies  Anticoagulation Management History:      The patient is taking warfarin and comes in today for a routine follow up visit.  Positive risk factors for bleeding include an age of 65 years or older.  The bleeding index is 'intermediate risk'.  Positive CHADS2 values include History of CHF and History of HTN.  Negative CHADS2 values include Age > 67 years old.  The start date was 11/29/2003.  Her last INR was 9.5 ratio.  Anticoagulation responsible provider: Clifton James MD, Cristal Deer.  INR POC: 2.3.  Cuvette Lot#: [id number].  Exp: 03/2011.    Anticoagulation Management Assessment/Plan:      The patient's current anticoagulation dose is Warfarin sodium 10 mg tabs: Take as directed by coumadin clinic..  The target INR is 2.5 - 3.5.  The next INR is due 04/30/2010.  Anticoagulation instructions were given to patient.  Results were reviewed/authorized by Tammy Sours, PharmD.         Prior Anticoagulation Instructions: INR 2.5 Continue previous dose of 1 tablet everyday except 0.5 tablet on Tuesday, Thursday, and Saturday Recheck INR in 4 weeks  Current Anticoagulation Instructions: INR 2.3  Take 1 and 1/2 tablets today then continue current dose of 1 tablet everyday except 1/2 tablet on Tuesdays, Thursdays, and Saturdays. Recheck INR in 3  weeks.

## 2010-05-13 NOTE — Medication Information (Signed)
Summary: rov/cb  Anticoagulant Therapy  Managed by: Bethena Midget, RN, BSN Referring MD: Charlton Haws MD PCP: Corine Shelter Supervising MD: Juanda Chance MD, Bynum Mccullars Indication 1: Mitral Valve Replacement (ICD-V43.3) Indication 2: Mitral Valve Disorder (ICD-424.0) Lab Used: [facility] Site: [facility] INR POC 1.6 INR RANGE 2.5 - 3.5  Dietary changes: no    Health status changes: no    Bleeding/hemorrhagic complications: no    Recent/future hospitalizations: no    Any changes in medication regimen? no    Recent/future dental: no  Any missed doses?: yes     Details: yesterday only took 5mg s.   Is patient compliant with meds? yes      Comments: Seeing Dr. Graciela Husbands today.   Allergies: No Known Drug Allergies  Anticoagulation Management History:      The patient is taking warfarin and comes in today for a routine follow up visit.  Positive risk factors for bleeding include an age of 65 years or older.  The bleeding index is 'intermediate risk'.  Positive CHADS2 values include History of CHF and History of HTN.  Negative CHADS2 values include Age > 104 years old.  The start date was 11/29/2003.  Her last INR was 9.5 ratio.  Anticoagulation responsible provider: Juanda Chance MD, Smitty Cords.  INR POC: 1.6.  Cuvette Lot#: [id number].  Exp: 05/2010.    Anticoagulation Management Assessment/Plan:      The patient's current anticoagulation dose is Warfarin sodium 10 mg tabs: Take as directed by coumadin clinic..  The target INR is 2.5 - 3.5.  The next INR is due 04/28/2009.  Anticoagulation instructions were given to patient.  Results were reviewed/authorized by Bethena Midget, RN, BSN.  She was notified by Bethena Midget, RN, BSN.         Prior Anticoagulation Instructions: INR 2.0. Take 1 tablet today, then take 0.5 tablet daily except 1 tablet on Mon and Fri.  Current Anticoagulation Instructions: INR 1.6 Today take 1 pill and tomorrow take 1 pill  then take 1/2 pill everyday except 1 pill on Mondays  and Fridays.

## 2010-05-13 NOTE — Medication Information (Signed)
Summary: rov/ewj  Anticoagulant Therapy  Managed by: Weston Brass, PharmD Referring MD: Charlton Haws MD PCP: Corine Shelter Supervising MD: Clifton James MD, Cristal Deer Indication 1: Mitral Valve Replacement (ICD-V43.3) Indication 2: Mitral Valve Disorder (ICD-424.0) Lab Used: [facility] Site: [facility] INR POC 2.9 INR RANGE 2.5 - 3.5  Dietary changes: no    Health status changes: no    Bleeding/hemorrhagic complications: no    Recent/future hospitalizations: no    Any changes in medication regimen? no    Recent/future dental: no  Any missed doses?: no       Is patient compliant with meds? yes       Allergies: No Known Drug Allergies  Anticoagulation Management History:      The patient is taking warfarin and comes in today for a routine follow up visit.  Positive risk factors for bleeding include an age of 65 years or older.  The bleeding index is 'intermediate risk'.  Positive CHADS2 values include History of CHF and History of HTN.  Negative CHADS2 values include Age > 26 years old.  The start date was 11/29/2003.  Her last INR was 9.5 ratio.  Anticoagulation responsible provider: Clifton James MD, Cristal Deer.  INR POC: 2.9.  Cuvette Lot#: [id number].  Exp: 11/2010.    Anticoagulation Management Assessment/Plan:      The patient's current anticoagulation dose is Warfarin sodium 10 mg tabs: Take as directed by coumadin clinic..  The target INR is 2.5 - 3.5.  The next INR is due 09/09/2009.  Anticoagulation instructions were given to patient.  Results were reviewed/authorized by Weston Brass, PharmD.  She was notified by Weston Brass PharmD.         Prior Anticoagulation Instructions: INR 3.6  Take 1/2 tablet today then start taking 1/2 tablet daily except 1 tablet on Mondays,  Wednesdays, and Fridays.  Recheck in 3 weeks.    Current Anticoagulation Instructions: INR 2.9  Continue same dose of 1/2 tablet every day except 1 tablet on Monday, Wednesday and Friday

## 2010-05-13 NOTE — Medication Information (Signed)
Summary: rov/sp  Anticoagulant Therapy  Managed by: Cloyde Reams, RN, BSN Referring MD: Charlton Haws MD PCP: Corine Shelter Supervising MD: Clifton James MD, Cristal Deer Indication 1: Mitral Valve Replacement (ICD-V43.3) Indication 2: Mitral Valve Disorder (ICD-424.0) Lab Used: [facility] Site: [facility] INR POC 4.2 INR RANGE 2.5 - 3.5  Dietary changes: no    Health status changes: yes       Details: Pt reports being sick, took course of Amoxicillin and had some GI symptoms.    Bleeding/hemorrhagic complications: no    Recent/future hospitalizations: no    Any changes in medication regimen? no    Recent/future dental: no  Any missed doses?: no       Is patient compliant with meds? yes       Allergies: No Known Drug Allergies  Anticoagulation Management History:      The patient is taking warfarin and comes in today for a routine follow up visit.  Positive risk factors for bleeding include an age of 65 years or older.  The bleeding index is 'intermediate risk'.  Positive CHADS2 values include History of CHF and History of HTN.  Negative CHADS2 values include Age > 93 years old.  The start date was 11/29/2003.  Her last INR was 9.5 ratio.  Anticoagulation responsible provider: Clifton James MD, Cristal Deer.  INR POC: 4.2.  Cuvette Lot#: [id number].  Exp: 02/2011.    Anticoagulation Management Assessment/Plan:      The patient's current anticoagulation dose is Warfarin sodium 10 mg tabs: Take as directed by coumadin clinic..  The target INR is 2.5 - 3.5.  The next INR is due 01/12/2010.  Anticoagulation instructions were given to patient.  Results were reviewed/authorized by Cloyde Reams, RN, BSN.  She was notified by Cloyde Reams RN.         Prior Anticoagulation Instructions: INR 2.7  Continue same dose of 1 tablet every day except 1/2 tablet on Tuesday, Thursday and Saturday.    Current Anticoagulation Instructions: INR 4.2  Skip today's dosage of Coumadin, then resume  same dosage 1 tablet daily except 1/2 tablet on Tuesdays, Thursdays, and Saturdays.  Recheck in 3 weeks.

## 2010-05-13 NOTE — Letter (Signed)
Summary: Appointment - Reschedule  [facility]     [place], [place]    Phone:   Fax:      June 09, 2009 MRN: [id number]   ARNIE MAIOLO [address] [place], [place]  [address]   Dear Ms. KOPKE,   Due to a change in our office schedule, your appointment on  07-15-2009   at  10:30              must be changed.  It is very important that we reach you to reschedule this appointment. We look forward to participating in your health care needs. Please contact us at the number listed above at your earliest convenience to reschedule this appointment.     Sincerely,      Lorne Skeens  [facility] Scheduling Team

## 2010-05-13 NOTE — Procedures (Signed)
Summary: device/saf   Current Medications (verified): 1)  Carvedilol 25 Mg Tabs (Carvedilol) .... Take 1 Tablet By Mouth Twice A Day 2)  Quinapril Hcl 40 Mg Tabs (Quinapril Hcl) .... Take 1 Tablet By Mouth Once A Day 3)  Digoxin 0.125 Mg Tabs (Digoxin) .Marland Kitchen.. 1 Once Daily 4)  Metformin Hcl 1000 Mg Tabs (Metformin Hcl) .Marland Kitchen.. 1 Two Times A Day 5)  Warfarin Sodium 10 Mg Tabs (Warfarin Sodium) .... Take As Directed By Coumadin Clinic. 6)  Hydrocodone-Acetaminophen 5-500 Mg Tabs (Hydrocodone-Acetaminophen) .... As Needed 7)  Trilipix 135 Mg Cpdr (Choline Fenofibrate) .... Take 1 Tablet By Mouth Once A Day 8)  Spironolactone 25 Mg Tabs (Spironolactone) .... Take1/2 Tablet By Mouth Daily 9)  Fish Oil 1000 Mg Caps (Omega-3 Fatty Acids) .... Take 1 Capsule By Mouth Once A Day  Allergies (verified): No Known Drug Allergies   ICD Specifications Following MD:  Sherryl Manges, MD     Referring MD:  [facility] ICD Vendor:  St Jude     ICD Model Number:  [id number]     ICD Serial Number:  [id number] ICD DOI:  07/20/2007     ICD Implanting MD:  Sherryl Manges, MD  Lead 1:    Location: [facility]     DOI: 07/20/2007     Model #: 7120     Serial #: [id number]     Status: active  Indications::  NICM   ICD Follow Up Remote Check?  No Battery Voltage:  3.20 V     Charge Time:  11.3 seconds     Underlying rhythm:  SR@93  ICD Dependent:  No       ICD Device Measurements Right Ventricle:  Amplitude: 12 mV, Impedance: 480 ohms, Threshold: 0.75 V at 0.5 msec Shock Impedance: 46 ohms   Episodes Coumadin:  Yes  Brady Parameters Mode VVI     Lower Rate Limit:  40      Tachy Zones VF:  240     VT:  210     VT1:  171     Next Cardiology Appt Due:  07/11/2010 Tech Comments:  No parameter changes. Device function normal.  ROV 3 months clinic. Altha Harm, LPN  May 06, 2010 12:03 PM

## 2010-05-13 NOTE — Medication Information (Signed)
Summary: rov/ewj  Anticoagulant Therapy  Managed by: Bethena Midget, RN, BSN Referring MD: Charlton Haws MD PCP: Corine Shelter Supervising MD: Johney Frame MD, Fayrene Fearing Indication 1: Mitral Valve Replacement (ICD-V43.3) Indication 2: Mitral Valve Disorder (ICD-424.0) Lab Used: [facility] Site: [facility] INR POC 3.1 INR RANGE 2.5 - 3.5  Dietary changes: no    Health status changes: no    Bleeding/hemorrhagic complications: no    Recent/future hospitalizations: no    Any changes in medication regimen? no    Recent/future dental: no  Any missed doses?: no       Is patient compliant with meds? yes       Allergies: No Known Drug Allergies  Anticoagulation Management History:      The patient is taking warfarin and comes in today for a routine follow up visit.  Positive risk factors for bleeding include an age of 67 years or older.  The bleeding index is 'intermediate risk'.  Positive CHADS2 values include History of CHF and History of HTN.  Negative CHADS2 values include Age > 67 years old.  The start date was 11/29/2003.  Her last INR was 9.5 ratio.  Anticoagulation responsible provider: Annmargaret Decaprio MD, Fayrene Fearing.  INR POC: 3.1.  Cuvette Lot#: [id number].  Exp: 07/2010.    Anticoagulation Management Assessment/Plan:      The patient's current anticoagulation dose is Warfarin sodium 10 mg tabs: Take as directed by coumadin clinic..  The target INR is 2.5 - 3.5.  The next INR is due 06/24/2009.  Anticoagulation instructions were given to patient.  Results were reviewed/authorized by Bethena Midget, RN, BSN.  She was notified by Bethena Midget, RN, BSN.         Prior Anticoagulation Instructions: INR: 3.5 Continue with same dose of 5mg  tablet daily except 10mg  on Mondays and Fridays Eat green leafy vegetables today Recheck in 4 weeks  Current Anticoagulation Instructions: INR 3.1 Continue 5mg s everyday except 10mg s on Mondays and Fridays. Recheck in 4 weeks.

## 2010-05-13 NOTE — Cardiovascular Report (Signed)
Summary: Office Visit   Office Visit   Imported By: Roderic Ovens 04/23/2009 10:37:38  _____________________________________________________________________  External Attachment:    Type:   Image     Comment:   External Document

## 2010-05-13 NOTE — Assessment & Plan Note (Signed)
Summary: f1y/pt need device check per klein call amber or paula   Visit Type:  Follow-up Primary Provider:  Corine Shelter  CC:  no complaints.  History of Present Illness: Deborah [name] is a very pleasant 65 year old woman with a history of congestive heart failure, secondary to nonischemic cardiomyopathy, ejection fraction around 25% s/p St Jude single chamber ICD.  She also has a history of mitral valve disease and status post mitral valve replacement with a St. Jude valve in 2005.  Remainder of her history is notable for diabetes, hypertension, hyperlipidemia, and kidney stones.   Doing very. No CP, SOB or edema.. No bleeding with coumadin. Able to do all activities without problem. Can walk all around [employer] without problem. Told me she has been compliant with meds but then she admitted she stopped her lasix about 2 months ago.   Current Medications (verified): 1)  Carvedilol 25 Mg Tabs (Carvedilol) .... Take 1 Tablet By Mouth Twice A Day 2)  Quinapril Hcl 40 Mg Tabs (Quinapril Hcl) .... Take 1 Tablet By Mouth Once A Day 3)  Digoxin 0.125 Mg Tabs (Digoxin) .Marland Kitchen.. 1 Once Daily 4)  Metformin Hcl 1000 Mg Tabs (Metformin Hcl) .Marland Kitchen.. 1 Two Times A Day 5)  Warfarin Sodium 10 Mg Tabs (Warfarin Sodium) .... Take As Directed By Coumadin Clinic. 6)  Potassium Chloride Cr 10 Meq Cr-Caps (Potassium Chloride) .... Take One Tablet By Mouth Twice A Day 7)  Furosemide 40 Mg Tabs (Furosemide) .... Take One Tablet By Mouth Daily As Needed 8)  Hydrocodone-Acetaminophen 5-500 Mg Tabs (Hydrocodone-Acetaminophen) .... As Needed 9)  Lipitor 80 Mg Tabs (Atorvastatin Calcium) .... Take 1 Tablet By Mouth Once A Day 10)  Trilipix 135 Mg Cpdr (Choline Fenofibrate) .... Take 1 Tablet By Mouth Once A Day  Allergies (verified): No Known Drug Allergies  Past History:  Past Medical History: Last updated: 07/30/2008 Congestive Heart Failure     --Non-ischemic CM EF 30-35% by echo 9/09 (Previous 25%)     -- s/p St.  Jude single chamber ICD 4/09     --Minimal CAD by cath 2004 LAD 20% LCX 20% Ramus 30% RCA nl Severe MR s/p St. Jude mechanical MVR by Gasper Lloyd 2005 Aortic Stenosis (mild) mean gradient echo 9/09 Diabetes Type 2 Kidney Stones Hyperlipidemia Hypertension  Review of Systems       As per HPI and past medical history; otherwise all systems negative.   Vital Signs:  Patient profile:   65 year old female Height:      63 inches Weight:      182 pounds BMI:     32.36 Pulse rate:   78 / minute BP sitting:   144 / 86  (left arm) Cuff size:   regular  Vitals Entered By: Hardin Negus, RMA (Aug 18, 2009 3:13 PM)  Physical Exam  General:  The patient was alert and oriented in no acute distress. HEENT Normal Neck: JVP to jaw, carotids were brisk. no bruits Lungs were clear.  PMI laterally displaced Heart sounds were regular with mechanical S1 with 2/6 systolic murmur at RSB. +soft s3 Abdomen was soft with active bowel sounds. There is no clubbing cyanosis or edema. Skin Warm and dry Neuro  oriented x 3  cranial nerves II-XII grossly intact. moves all 4 extremities without difficulty.    ICD Specifications Following MD:  Sherryl Manges, MD     Referring MD:  Gala Romney ICD Vendor:  St Jude     ICD Model Number:  5784-69     ICD Serial Number:  [id number] ICD DOI:  07/20/2007     ICD Implanting MD:  Sherryl Manges, MD  Lead 1:    Location: [facility]     DOI: 07/20/2007     Model #: 7120     Serial #: [id number]     Status: active  Indications::  NICM   ICD Follow Up ICD Dependent:  No      Episodes Coumadin:  Yes  Brady Parameters Mode VVI     Lower Rate Limit:  40      Tachy Zones VF:  240     VT:  210     VT1:  171     Impression & Recommendations:  Problem # 1:  SYSTOLIC HEART FAILURE, ACUTE ON CHRONIC (ICD-428.23) She has signifcant volume overload in the setting of noncomplaince with lasix and dietary restrictions. Will restart lasix at 40 two times a day fo 2 days and then  go to lasix 40 every other day. Check labs next week. Due for repeat echo. I will see her back in 2 weeks. Stressed need for compliance. Will plan on adding spironolactone on f/u.   Problem # 2:  MITRAL VALVE DISORDERS (ICD-424.0) SOunds good on exam. Due for repeat echo.   Problem # 3:  HYPERTENSION, BENIGN (ICD-401.1) BP up. suspect it will improve with diuresis.  Other Orders: EKG w/ Interpretation (93000) Echocardiogram (Echo)  Patient Instructions: 1)  Take Furosemide 40mg  two times a day for 2 days then decrease to every other day  2)  Take Potassium only when you take furosemide 3)  Your physician recommends that you return for lab work in: 1-2 weeks (bmet 428.23) 4)  Your physician has requested that you have an echocardiogram.  Echocardiography is a painless test that uses sound waves to create images of your heart. It provides your doctor with information about the size and shape of your heart and how well your heart's chambers and valves are working.  This procedure takes approximately one hour. There are no restrictions for this procedure. 5)  Follow up in 2-3 weeks

## 2010-05-13 NOTE — Assessment & Plan Note (Signed)
Summary: 6wk f/u sl   Visit Type:  Follow-up Primary Provider:  Corine Shelter  CC:  no complaints.  History of Present Illness: [name] is a very pleasant 65 year old woman with a history of congestive heart failure, secondary to nonischemic cardiomyopathy, ejection fraction around 25% s/p St Jude single chamber ICD.  She also has a history of mitral valve disease and status post mitral valve replacement with a St. Jude valve in 2005.  Remainder of her history is notable for diabetes, hypertension, hyperlipidemia, and kidney stones.   We saw her recently and I was concerned about worsening functional capacity. CPX performed June 2011 pVO2 16.8 slope 26.1 RER 1.11 O2 pulse 90% predicted O2 sat 87% at peak with possible hypoventilation.  Returns today for routine f/u . Doing well. Denies CP or SOB. No edema. Compliant with all meds. No bleeding with coumadin.   Current Medications (verified): 1)  Carvedilol 25 Mg Tabs (Carvedilol) .... Take 1 Tablet By Mouth Twice A Day 2)  Quinapril Hcl 40 Mg Tabs (Quinapril Hcl) .... Take 1 Tablet By Mouth Once A Day 3)  Digoxin 0.125 Mg Tabs (Digoxin) .Marland Kitchen.. 1 Once Daily 4)  Metformin Hcl 1000 Mg Tabs (Metformin Hcl) .Marland Kitchen.. 1 Two Times A Day 5)  Warfarin Sodium 10 Mg Tabs (Warfarin Sodium) .... Take As Directed By Coumadin Clinic. 6)  Furosemide 40 Mg Tabs (Furosemide) .... Take One Tablet By Mouth Daily As Needed 7)  Hydrocodone-Acetaminophen 5-500 Mg Tabs (Hydrocodone-Acetaminophen) .... As Needed 8)  Lipitor 80 Mg Tabs (Atorvastatin Calcium) .... Take 1 Tablet By Mouth Once A Day 9)  Trilipix 135 Mg Cpdr (Choline Fenofibrate) .... Take 1 Tablet By Mouth Once A Day 10)  Spironolactone 25 Mg Tabs (Spironolactone) .... Take1/2 Tablet By Mouth Daily 11)  Fish Oil 1000 Mg Caps (Omega-3 Fatty Acids) .... Take 1 Capsule By Mouth Once A Day  Allergies (verified): No Known Drug Allergies  Past History:  Past Medical History: Last updated:  09/09/2009 Congestive Heart Failure     --Non-ischemic CM EF 30-35% by echo 9/09 (Previous 25%)     -- s/p St. Jude single chamber ICD 4/09     --Minimal CAD by cath 2004 LAD 20% LCX 20% Ramus 30% RCA nl     --echo 5/11  EF 20-25% Severe MR s/p St. Jude mechanical MVR by Gasper Lloyd 2005 Aortic Stenosis (mild) mean gradient echo 9/09 Diabetes Type 2 Kidney Stones Hyperlipidemia Hypertension  Review of Systems       As per HPI and past medical history; otherwise all systems negative.   Vital Signs:  Patient profile:   65 year old female Height:      63 inches Weight:      175 pounds BMI:     31.11 Pulse rate:   70 / minute BP sitting:   120 / 78  (left arm) Cuff size:   regular  Vitals Entered By: Hardin Negus, RMA (October 15, 2009 12:27 PM)  Physical Exam  General:  The patient was alert and oriented in no acute distress. Long hall walk. no dyspnea. sats 98%-->92% HEENT Normal Neck: JVP flat carotids were brisk. no bruits Lungs were clear.  PMI laterally displaced Heart sounds were regular with mechanical S1 with 2/6 systolic murmur at RSB. +soft s3 Abdomen was soft with active bowel sounds. There is no clubbing cyanosis or edema. Skin Warm and dry Neuro  oriented x 3  cranial nerves II-XII grossly intact. moves all 4 extremities  without difficulty.    ICD Specifications Following MD:  Sherryl Manges, MD     Referring MD:  [facility] ICD Vendor:  St Jude     ICD Model Number:  [id number]     ICD Serial Number:  [id number] ICD DOI:  07/20/2007     ICD Implanting MD:  Sherryl Manges, MD  Lead 1:    Location: [facility]     DOI: 07/20/2007     Model #: 7120     Serial #: [id number]     Status: active  Indications::  NICM   ICD Follow Up ICD Dependent:  No      Episodes Coumadin:  Yes  Brady Parameters Mode VVI     Lower Rate Limit:  40      Tachy Zones VF:  240     VT:  210     VT1:  171     Impression & Recommendations:  Problem # 1:  SYSTOLIC HEART FAILURE, CHRONIC  (ICD-428.22) CPX results reviewed with patient and family. Despite low EF. Functional capacity is preserved and likley would not benefit from advanced therapies at this point. Continue aggressive medical rx.   Problem # 2:  HYPOXEMIA (ICD-799.02) Will check PFTs with [facility]CO. Sats > 90% with hall walk so does not need home O2 at this point  Problem # 3:  MITRAL VALVE DISORDERS (ICD-424.0) Stable. Continue coumadin for MVR.   Other Orders: Pulmonary Function Test (PFT)  Patient Instructions: 1)  Your physician has recommended that you have a pulmonary function test.  Pulmonary Function Tests are a group of tests that measure how well air moves in and out of your lungs. 2)  Follow up in 3-4 months

## 2010-05-13 NOTE — Medication Information (Signed)
Summary: rov/tm  Anticoagulant Therapy  Managed by: Bethena Midget, RN, BSN Referring MD: Charlton Haws MD PCP: Corine Shelter Supervising MD: Johney Frame MD, Fayrene Fearing Indication 1: Mitral Valve Replacement (ICD-V43.3) Indication 2: Mitral Valve Disorder (ICD-424.0) Lab Used: [facility] Site: [facility] INR POC 2.5 INR RANGE 2.5 - 3.5  Dietary changes: no    Health status changes: no    Bleeding/hemorrhagic complications: no    Recent/future hospitalizations: no    Any changes in medication regimen? no    Recent/future dental: no  Any missed doses?: no       Is patient compliant with meds? yes       Allergies: No Known Drug Allergies  Anticoagulation Management History:      The patient is taking warfarin and comes in today for a routine follow up visit.  Positive risk factors for bleeding include an age of 67 years or older.  The bleeding index is 'intermediate risk'.  Positive CHADS2 values include History of CHF and History of HTN.  Negative CHADS2 values include Age > 46 years old.  The start date was 11/29/2003.  Her last INR was 9.5 ratio.  Anticoagulation responsible provider: Jossalin Chervenak MD, Fayrene Fearing.  INR POC: 2.5.  Cuvette Lot#: [id number].  Exp: 12/2010.    Anticoagulation Management Assessment/Plan:      The patient's current anticoagulation dose is Warfarin sodium 10 mg tabs: Take as directed by coumadin clinic..  The target INR is 2.5 - 3.5.  The next INR is due 11/03/2009.  Anticoagulation instructions were given to patient.  Results were reviewed/authorized by Bethena Midget, RN, BSN.  She was notified by Bethena Midget, RN, BSN.         Prior Anticoagulation Instructions: INR 2.0 Today take 1 1/2 pills then change dose to 1 pill everyday except 1/2 pill on Tuesdays, Thursdays and Saturdays. Recheck in 2 weeks.   Current Anticoagulation Instructions: INR 2.5 Continue 1 pill everyday except 1/2 pill on Tuesdays, Thursdays and Saturdays. Recheck in 2 weeks.

## 2010-05-13 NOTE — Medication Information (Signed)
Summary: rov/ewj  Anticoagulant Therapy  Managed by: Weston Brass, PharmD Referring MD: Charlton Haws MD PCP: Corine Shelter Supervising MD: Myrtis Ser MD, Tinnie Gens Indication 1: Mitral Valve Replacement (ICD-V43.3) Indication 2: Mitral Valve Disorder (ICD-424.0) Lab Used: [facility] Site: [facility] INR POC 2.7 INR RANGE 2.5 - 3.5  Dietary changes: no    Health status changes: no    Bleeding/hemorrhagic complications: no    Recent/future hospitalizations: no    Any changes in medication regimen? no    Recent/future dental: no  Any missed doses?: no       Is patient compliant with meds? yes       Allergies: No Known Drug Allergies  Anticoagulation Management History:      The patient is taking warfarin and comes in today for a routine follow up visit.  Positive risk factors for bleeding include an age of 65 years or older.  The bleeding index is 'intermediate risk'.  Positive CHADS2 values include History of CHF and History of HTN.  Negative CHADS2 values include Age > 87 years old.  The start date was 11/29/2003.  Her last INR was 9.5 ratio.  Anticoagulation responsible provider: Myrtis Ser MD, Tinnie Gens.  INR POC: 2.7.  Cuvette Lot#: [id number].  Exp: 01/2011.    Anticoagulation Management Assessment/Plan:      The patient's current anticoagulation dose is Warfarin sodium 10 mg tabs: Take as directed by coumadin clinic..  The target INR is 2.5 - 3.5.  The next INR is due 12/22/2009.  Anticoagulation instructions were given to patient.  Results were reviewed/authorized by Weston Brass, PharmD.  She was notified by Weston Brass PharmD.         Prior Anticoagulation Instructions: INR 2.4  Take 1 tablet today, then start taking 1 tablet daily except 1/2 tablet on Tuesdays, Thursdays, and Saturdays.  Recheck in 3 weeks.    Current Anticoagulation Instructions: INR 2.7  Continue same dose of 1 tablet every day except 1/2 tablet on Tuesday, Thursday and Saturday.

## 2010-05-13 NOTE — Procedures (Signed)
Summary: Cardiology Device Clinic   Current Medications (verified): 1)  Carvedilol 25 Mg Tabs (Carvedilol) .... Take 1 Tablet By Mouth Twice A Day 2)  Quinapril Hcl 40 Mg Tabs (Quinapril Hcl) .... Take 1 Tablet By Mouth Once A Day 3)  Aspirin 81 Mg  Tabs (Aspirin) .... Once Daily 4)  Digoxin 0.125 Mg Tabs (Digoxin) .Marland Kitchen.. 1 Once Daily 5)  Metformin Hcl 1000 Mg Tabs (Metformin Hcl) .Marland Kitchen.. 1 Two Times A Day 6)  Warfarin Sodium 10 Mg Tabs (Warfarin Sodium) .... Take As Directed By Coumadin Clinic. 7)  Potassium Chloride Cr 10 Meq Cr-Caps (Potassium Chloride) .... Take One Tablet By Mouth Twice A Day 8)  Furosemide 40 Mg Tabs (Furosemide) .... Take One Tablet By Mouth Daily As Needed 9)  Hydrocodone-Acetaminophen 5-500 Mg Tabs (Hydrocodone-Acetaminophen) .... As Needed 10)  Lipitor 80 Mg Tabs (Atorvastatin Calcium) .... Take 1 Tablet By Mouth Once A Day 11)  Trilipix 135 Mg Cpdr (Choline Fenofibrate) .... Take 1 Tablet By Mouth Once A Day  Allergies (verified): No Known Drug Allergies   ICD Specifications Following MD:  Sherryl Manges, MD     Referring MD:  [facility] ICD Vendor:  St Jude     ICD Model Number:  [id number]     ICD Serial Number:  [id number] ICD DOI:  07/20/2007     ICD Implanting MD:  Sherryl Manges, MD  Lead 1:    Location: RV     DOI: 07/20/2007     Model #: 7120     Serial #: [id number]     Status: active  Indications::  NICM   ICD Follow Up Remote Check?  No Battery Voltage:  3.20 V     Charge Time:  11.4 seconds     Battery Est. Longevity:  7.6 YEARS ICD Dependent:  No       ICD Device Measurements Right Ventricle:  Amplitude: 12 mV, Impedance: 460 ohms, Threshold: 0.75 V at 0.5 msec Shock Impedance: 42 ohms   Episodes Coumadin:  Yes Shock:  0     ATP:  0     Nonsustained:  0     Ventricular Pacing:  <1%  Brady Parameters Mode VVI     Lower Rate Limit:  40      Tachy Zones VF:  240     VT:  210     VT1:  171     Next Cardiology Appt Due:  10/09/2009 Tech Comments:   Normal device function.  No changes made today.  Pt prefers office visits to merlin.  ROV 3 months SK. Gypsy Balsam RN BSN  July 20, 2009 4:47 PM

## 2010-05-13 NOTE — Cardiovascular Report (Signed)
Summary: Office Visit   Office Visit   Imported By: Roderic Ovens 02/11/2010 16:10:37  _____________________________________________________________________  External Attachment:    Type:   Image     Comment:   External Document

## 2010-05-13 NOTE — Assessment & Plan Note (Signed)
Summary: rov/echo done 09-04-09   Primary Provider:  Corine Shelter  CC:  ROV; C/O occasional fatigue; has question about taking Iron & vacation.  History of Present Illness: [name] is a very pleasant 65 year old woman with a history of congestive heart failure, secondary to nonischemic cardiomyopathy, ejection fraction around 25% s/p St Jude single chamber ICD.  She also has a history of mitral valve disease and status post mitral valve replacement with a St. Jude valve in 2005.  Remainder of her history is notable for diabetes, hypertension, hyperlipidemia, and kidney stones.   We last saw her about 3 weeks ago and had significant volume overload in setting of stopping her lasix. lasix restarted and now down 8 pounds. Feels better. No lightheadedness. Can walk far if she goes slow but avoids steps. No orthopne or PND. No edema. Echo last week which i reviewed markedly dilated LV EF 25-25%. mechanical MVR ok.    Current Medications (verified): 1)  Carvedilol 25 Mg Tabs (Carvedilol) .... Take 1 Tablet By Mouth Twice A Day 2)  Quinapril Hcl 40 Mg Tabs (Quinapril Hcl) .... Take 1 Tablet By Mouth Once A Day 3)  Digoxin 0.125 Mg Tabs (Digoxin) .Marland Kitchen.. 1 Once Daily 4)  Metformin Hcl 1000 Mg Tabs (Metformin Hcl) .Marland Kitchen.. 1 Two Times A Day 5)  Warfarin Sodium 10 Mg Tabs (Warfarin Sodium) .... Take As Directed By Coumadin Clinic. 6)  Potassium Chloride Cr 10 Meq Cr-Caps (Potassium Chloride) .... Take One Tablet By Mouth Twice A Day 7)  Furosemide 40 Mg Tabs (Furosemide) .... Take One Tablet By Mouth Daily As Needed 8)  Hydrocodone-Acetaminophen 5-500 Mg Tabs (Hydrocodone-Acetaminophen) .... As Needed 9)  Lipitor 80 Mg Tabs (Atorvastatin Calcium) .... Take 1 Tablet By Mouth Once A Day 10)  Trilipix 135 Mg Cpdr (Choline Fenofibrate) .... Take 1 Tablet By Mouth Once A Day  Allergies (verified): No Known Drug Allergies  Past History:  Past Medical History: Congestive Heart Failure  --Non-ischemic CM EF 30-35% by echo 9/09 (Previous 25%)     -- s/p St. Jude single chamber ICD 4/09     --Minimal CAD by cath 2004 LAD 20% LCX 20% Ramus 30% RCA nl     --echo 5/11  EF 20-25% Severe MR s/p St. Jude mechanical MVR by Gasper Lloyd 2005 Aortic Stenosis (mild) mean gradient echo 9/09 Diabetes Type 2 Kidney Stones Hyperlipidemia Hypertension  Review of Systems       As per HPI and past medical history; otherwise all systems negative.   Vital Signs:  Patient profile:   65 year old female Height:      63 inches Weight:      174 pounds BMI:     30.93 Pulse rate:   64 / minute Pulse rhythm:   regular BP sitting:   120 / 70  (left arm) Cuff size:   regular  Vitals Entered By: Stanton Kidney, EMT-P (September 09, 2009 9:54 AM)  Physical Exam  General:  The patient was alert and oriented in no acute distress. HEENT Normal Neck: JVP flat carotids were brisk. no bruits Lungs were clear.  PMI laterally displaced Heart sounds were regular with mechanical S1 with 2/6 systolic murmur at RSB. +soft s3 Abdomen was soft with active bowel sounds. There is no clubbing cyanosis or edema. Skin Warm and dry Neuro  oriented x 3  cranial nerves II-XII grossly intact. moves all 4 extremities without difficulty.    ICD Specifications Following MD:  Sherryl Manges, MD  Referring MD:  Demetries Coia ICD Vendor:  St Jude     ICD Model Number:  [id number]     ICD Serial Number:  [id number] ICD DOI:  07/20/2007     ICD Implanting MD:  Sherryl Manges, MD  Lead 1:    Location: RV     DOI: 07/20/2007     Model #: 7120     Serial #: [id number]     Status: active  Indications::  NICM   ICD Follow Up ICD Dependent:  No      Episodes Coumadin:  Yes  Brady Parameters Mode VVI     Lower Rate Limit:  40      Tachy Zones VF:  240     VT:  210     VT1:  171     Impression & Recommendations:  Problem # 1:  SYSTOLIC HEART FAILURE, CHRONIC (ICD-428.22) NYHA class III. Volume now improved. On good meds.  Stop KCL add spiro 12.5 once daily and fish oil 1g once daily. Needs CPX test for further risk stratification. BMET and BNP in 2 weeks.   Other Orders: CPX Test at [facility] (CPX Test)  Patient Instructions: 1)  Stop Potassium 2)  Start Spironolactone 25mg  1/2 tab daily 3)  Start Fish Oil 1000mg  daily 4)  Your physician recommends that you return for lab work in: 2 weeks (bmet, bnp 428.22) 5)  Your physician has recommended that you have a cardiopulmonary stress test (CPX).  CPX testing is a non-invasive measurement of heart and lung function. It replaces a traditional treadmill stress test. This type of test provides a tremendous amount of information that relates not only to your present condition but also for future outcomes.  This test combines measurements of your ventilation, respiratory gas exchange in the lungs, electrocardiogram (EKG), blood pressure and physical response before, during, and following an exercise protocol. 6)  Follow up in 6 weeks Prescriptions: SPIRONOLACTONE 25 MG TABS (SPIRONOLACTONE) Take1/2 tablet by mouth daily  #30 x 6   Entered by:   Meredith Staggers, RN   Authorized by:   Dolores Patty, MD, [facility]   Signed by:   Meredith Staggers, RN on 09/09/2009   Method used:   Faxed to ...       [facility]       [address]       [place], [place]  [address]       Ph: [phone]       Fax: [phone]   RxID:   [id number]

## 2010-05-13 NOTE — Procedures (Signed)
Summary: device check/sl   Current Medications (verified): 1)  Carvedilol 25 Mg Tabs (Carvedilol) .... Take 1 Tablet By Mouth Twice A Day 2)  Quinapril Hcl 40 Mg Tabs (Quinapril Hcl) .... Take 1 Tablet By Mouth Once A Day 3)  Digoxin 0.125 Mg Tabs (Digoxin) .Marland Kitchen.. 1 Once Daily 4)  Metformin Hcl 1000 Mg Tabs (Metformin Hcl) .Marland Kitchen.. 1 Two Times A Day 5)  Warfarin Sodium 10 Mg Tabs (Warfarin Sodium) .... Take As Directed By Coumadin Clinic. 6)  Furosemide 40 Mg Tabs (Furosemide) .... Take One Tablet By Mouth Daily As Needed 7)  Hydrocodone-Acetaminophen 5-500 Mg Tabs (Hydrocodone-Acetaminophen) .... As Needed 8)  Lipitor 80 Mg Tabs (Atorvastatin Calcium) .... Take 1 Tablet By Mouth Once A Day 9)  Trilipix 135 Mg Cpdr (Choline Fenofibrate) .... Take 1 Tablet By Mouth Once A Day 10)  Spironolactone 25 Mg Tabs (Spironolactone) .... Take1/2 Tablet By Mouth Daily 11)  Fish Oil 1000 Mg Caps (Omega-3 Fatty Acids) .... Take 1 Capsule By Mouth Once A Day 12)  Vitamin D 50,0000 Iu .... Take As Directed---Once A Week  Allergies (verified): No Known Drug Allergies   ICD Specifications Following MD:  Sherryl Manges, MD     Referring MD:  BENSIMHON ICD Vendor:  St Jude     ICD Model Number:  [id number]     ICD Serial Number:  [id number] ICD DOI:  07/20/2007     ICD Implanting MD:  Sherryl Manges, MD  Lead 1:    Location: [facility]     DOI: 07/20/2007     Model #: 7120     Serial #: [id number]     Status: active  Indications::  NICM   ICD Follow Up Battery Voltage:  3.20 V     Charge Time:  11.3 seconds     Battery Est. Longevity:  7.3-7.6 yrs Underlying rhythm:  SR ICD Dependent:  No       ICD Device Measurements Right Ventricle:  Amplitude: 11.4 mV, Impedance: 500 ohms, Threshold: 0.75 V at 0.5 msec Shock Impedance: 45 ohms   Episodes MS Episodes:  0     Coumadin:  Yes Shock:  0     ATP:  0     Nonsustained:  0     Atrial Therapies:  0 Ventricular Pacing:  <1%  Brady Parameters Mode VVI     Lower Rate Limit:   40      Tachy Zones VF:  240     VT:  210     VT1:  171     Next Cardiology Appt Due:  04/12/2010 Tech Comments:  NORMAL DEVICE FUNCTION. NO EPISODES SINCE LAST CHECK.  NO CHANGES MADE. ROV IN 3 MTHS W/DEVICE CLINIC. Vella Kohler  February 01, 2010 11:43 AM

## 2010-05-13 NOTE — Miscellaneous (Signed)
Summary: Orders Update pft charges  Clinical Lists Changes  Orders: Added new Service order of Carbon Monoxide diffusing w/capacity (94720) - Signed Added new Service order of Lung Volumes (94240) - Signed Added new Service order of Spirometry (Pre & Post) (94060) - Signed 

## 2010-05-13 NOTE — Medication Information (Signed)
Summary: rov/tm  Anticoagulant Therapy  Managed by: Cloyde Reams, RN, BSN Referring MD: Charlton Haws MD PCP: [facility] Shelter Supervising MD: Excell Seltzer MD, Casimiro Needle Indication 1: Mitral Valve Replacement (ICD-V43.3) Indication 2: Mitral Valve Disorder (ICD-424.0) Lab Used: [facility] Site: [facility] INR POC 3.6 INR RANGE 2.5 - 3.5  Dietary changes: no    Health status changes: yes       Details: Feeling a little nauseated today, denies CP or in SOB.    Bleeding/hemorrhagic complications: no    Recent/future hospitalizations: no    Any changes in medication regimen? no    Recent/future dental: no  Any missed doses?: no       Is patient compliant with meds? yes       Allergies (verified): No Known Drug Allergies  Anticoagulation Management History:      The patient is taking warfarin and comes in today for a routine follow up visit.  Positive risk factors for bleeding include an age of 67 years or older.  The bleeding index is 'intermediate risk'.  Positive CHADS2 values include History of CHF and History of HTN.  Negative CHADS2 values include Age > 67 years old.  The start date was 11/29/2003.  Her last INR was 9.5 ratio.  Anticoagulation responsible provider: Excell Seltzer MD, Casimiro Needle.  INR POC: 3.6.  Cuvette Lot#: [id number].  Exp: 09/2010.    Anticoagulation Management Assessment/Plan:      The patient's current anticoagulation dose is Warfarin sodium 10 mg tabs: Take as directed by coumadin clinic..  The target INR is 2.5 - 3.5.  The next INR is due 08/24/2009.  Anticoagulation instructions were given to patient.  Results were reviewed/authorized by Cloyde Reams, RN, BSN.         Prior Anticoagulation Instructions: INR 2.2 Change dose to 10mg s on Mondays, Wednesdays, Fridays and Saturdays and 5mg s on Tuesdays, Thursdays and Sundays. Recheck in 2 weeks.   Current Anticoagulation Instructions: INR 3.6  Take 1/2 tablet today then start taking 1/2 tablet daily except 1 tablet  on Mondays,  Wednesdays, and Fridays.  Recheck in 3 weeks.

## 2010-05-13 NOTE — Medication Information (Signed)
Summary: rov/tm  Anticoagulant Therapy  Managed by: Cloyde Reams, RN, BSN Referring MD: Charlton Haws MD PCP: [facility] Shelter Supervising MD: Juanda Chance MD, Latonda Larrivee Indication 1: Mitral Valve Replacement (ICD-V43.3) Indication 2: Mitral Valve Disorder (ICD-424.0) Lab Used: [facility] Site: [facility] INR POC 2.4 INR RANGE 2.5 - 3.5  Dietary changes: no    Health status changes: no    Bleeding/hemorrhagic complications: no    Recent/future hospitalizations: no    Any changes in medication regimen? no    Recent/future dental: no  Any missed doses?: no       Is patient compliant with meds? yes       Allergies: No Known Drug Allergies  Anticoagulation Management History:      The patient is taking warfarin and comes in today for a routine follow up visit.  Positive risk factors for bleeding include an age of 8 years or older.  The bleeding index is 'intermediate risk'.  Positive CHADS2 values include History of CHF and History of HTN.  Negative CHADS2 values include Age > 22 years old.  The start date was 11/29/2003.  Her last INR was 9.5 ratio.  Anticoagulation responsible provider: Juanda Chance MD, Smitty Cords.  INR POC: 2.4.  Cuvette Lot#: [id number].  Exp: 01/2011.    Anticoagulation Management Assessment/Plan:      The patient's current anticoagulation dose is Warfarin sodium 10 mg tabs: Take as directed by coumadin clinic..  The target INR is 2.5 - 3.5.  The next INR is due 11/24/2009.  Anticoagulation instructions were given to patient.  Results were reviewed/authorized by Cloyde Reams, RN, BSN.  She was notified by Cloyde Reams RN.         Prior Anticoagulation Instructions: INR 2.5 Continue 1 pill everyday except 1/2 pill on Tuesdays, Thursdays and Saturdays. Recheck in 2 weeks.   Current Anticoagulation Instructions: INR 2.4  Take 1 tablet today, then start taking 1 tablet daily except 1/2 tablet on Tuesdays, Thursdays, and Saturdays.  Recheck in 3 weeks.

## 2010-05-13 NOTE — Assessment & Plan Note (Signed)
Summary: [email or web address]   Primary Provider:  [facility] Shelter   History of Present Illness: Deborah [name] is a very pleasant 65 year old woman with a history of congestive heart failure, secondary to nonischemic cardiomyopathy, ejection fraction around 25% s/p St Jude single chamber ICD.  She also has a history of mitral valve disease and status post mitral valve replacement with a St. Jude valve in 2005.  Remainder of her history is notable for diabetes, hypertension, hyperlipidemia, and kidney stones.    The patient denies SOB, chest pain, edema or palpitations   Current Medications (verified): 1)  Carvedilol 25 Mg Tabs (Carvedilol) .... Take 1 Tablet By Mouth Twice A Day 2)  Quinapril Hcl 40 Mg Tabs (Quinapril Hcl) .... Take 1 Tablet By Mouth Once A Day 3)  Aspirin 81 Mg  Tabs (Aspirin) .... Once Daily 4)  Digoxin 0.125 Mg Tabs (Digoxin) .Marland Kitchen.. 1 Once Daily 5)  Metformin Hcl 1000 Mg Tabs (Metformin Hcl) .Marland Kitchen.. 1 Two Times A Day 6)  Warfarin Sodium 10 Mg Tabs (Warfarin Sodium) .... Take As Directed By Coumadin Clinic. 7)  Potassium Chloride Cr 10 Meq Cr-Caps (Potassium Chloride) .... Take One Tablet By Mouth Twice A Day 8)  Furosemide 40 Mg Tabs (Furosemide) .... Take One Tablet By Mouth Daily As Needed 9)  Hydrocodone-Acetaminophen 5-500 Mg Tabs (Hydrocodone-Acetaminophen) .... As Needed 10)  Lipitor 80 Mg Tabs (Atorvastatin Calcium) .... Take 1 Tablet By Mouth Once A Day 11)  Trilipix 135 Mg Cpdr (Choline Fenofibrate) .... Take 1 Tablet By Mouth Once A Day  Allergies (verified): No Known Drug Allergies  Past History:  Past Medical History: Last updated: 07/30/2008 Congestive Heart Failure     --Non-ischemic CM EF 30-35% by echo 9/09 (Previous 25%)     -- s/p St. Jude single chamber ICD 4/09     --Minimal CAD by cath 2004 LAD 20% LCX 20% Ramus 30% RCA nl Severe MR s/p St. Jude mechanical MVR by Gasper Lloyd 2005 Aortic Stenosis (mild) mean gradient echo 9/09 Diabetes  Type 2 Kidney Stones Hyperlipidemia Hypertension  Past Surgical History: Last updated: 04/10/2009 Appendectomy AICD implantation-2009  Social History: Last updated: 04/10/2009 Retired  Tobacco Use - Former. -remote hx  Vital Signs:  Patient profile:   65 year old female Height:      63 inches Weight:      174 pounds BMI:     30.93 Pulse rate:   72 / minute Pulse rhythm:   regular BP sitting:   142 / 84  (left arm) Cuff size:   regular  Vitals Entered By: Judithe Modest CMA (April 14, 2009 10:57 AM)  Physical Exam  General:  The patient was alert and oriented in no acute distress. HEENT Normal.  Neck veins were flat, carotids were brisk.  Lungs were clear.  Heart sounds were regular with mechanical S1 with systolic murmur Abdomen was soft with active bowel sounds. There is no clubbing cyanosis or edema. Skin Warm and dry     ICD Specifications Following MD:  Sherryl Manges, MD     Referring MD:  Deborah [name] ICD Vendor:  St Jude     ICD Model Number:  [id number]     ICD Serial Number:  [id number] ICD DOI:  07/20/2007     ICD Implanting MD:  Sherryl Manges, MD  Lead 1:    Location: [facility]     DOI: 07/20/2007     Model #: 7120     Serial #: [id number]  Status: active  Indications::  NICM   ICD Follow Up Remote Check?  No Battery Voltage:  3.20 V     Charge Time:  11.4 seconds     Battery Est. Longevity:  7.4 years Underlying rhythm:  SR ICD Dependent:  No       ICD Device Measurements Right Ventricle:  Amplitude: 9.7 mV, Impedance: 440 ohms, Threshold: 0.75 V at 0.5 msec Shock Impedance: 39 ohms   Episodes Coumadin:  Yes Shock:  0     ATP:  0     Nonsustained:  0     Ventricular Pacing:  <1%  Brady Parameters Mode VVI     Lower Rate Limit:  40      Tachy Zones VF:  240     VT:  210     VT1:  171     Next Cardiology Appt Due:  07/10/2009 Tech Comments:  No parameter changes.  Device function normal.   ROV 3 months clinic Deborah Harm, LPN  April 14, 2009 11:20 AM     Impression & Recommendations:  Problem # 1:  IMPLANTATION OF DEFIBRILLATOR, ST JUDE CURRENT 1207 (ICD-V45.02) normal device function  Problem # 2:  SYSTOLIC HEART FAILURE, CHRONIC (ICD-428.22) stable heart failure on her current medications; she is a candidate for consideration of Aldactone based on [facility] Her updated medication list for this problem includes:    Carvedilol 25 Mg Tabs (Carvedilol) .Marland Kitchen... Take 1 tablet by mouth twice a day    Quinapril Hcl 40 Mg Tabs (Quinapril hcl) .Marland Kitchen... Take 1 tablet by mouth once a day    Aspirin 81 Mg Tabs (Aspirin) ..... Once daily    Digoxin 0.125 Mg Tabs (Digoxin) .Marland Kitchen... 1 once daily    Warfarin Sodium 10 Mg Tabs (Warfarin sodium) .Marland Kitchen... Take as directed by coumadin clinic.    Furosemide 40 Mg Tabs (Furosemide) .Marland Kitchen... Take one tablet by mouth daily as needed  Patient Instructions: 1)  Your physician recommends that you schedule a follow-up appointment in: 3 months with device clinic

## 2010-05-13 NOTE — Miscellaneous (Signed)
Summary: Orders Update  Clinical Lists Changes  Orders: Added new Test order of TLB-BMP (Basic Metabolic Panel-BMET) (80048-METABOL) - Signed 

## 2010-05-13 NOTE — Medication Information (Signed)
Summary: rov/sp  Anticoagulant Therapy  Managed by: Weston Brass, PharmD Referring MD: Charlton Haws MD PCP: [facility] Shelter Supervising MD: Riley Kill MD, Maisie Fus Indication 1: Mitral Valve Replacement (ICD-V43.3) Indication 2: Mitral Valve Disorder (ICD-424.0) Lab Used: [facility] Site: [facility] INR POC 3.0 INR RANGE 2.5 - 3.5  Dietary changes: no    Health status changes: no    Bleeding/hemorrhagic complications: no    Recent/future hospitalizations: no    Any changes in medication regimen? no    Recent/future dental: no  Any missed doses?: no       Is patient compliant with meds? yes       Allergies: No Known Drug Allergies  Anticoagulation Management History:      The patient is taking warfarin and comes in today for a routine follow up visit.  Positive risk factors for bleeding include an age of 67 years or older.  The bleeding index is 'intermediate risk'.  Positive CHADS2 values include History of CHF and History of HTN.  Negative CHADS2 values include Age > 67 years old.  The start date was 11/29/2003.  Her last INR was 9.5 ratio.  Anticoagulation responsible provider: Riley Kill MD, Maisie Fus.  INR POC: 3.0.  Cuvette Lot#: [id number].  Exp: 11/2010.    Anticoagulation Management Assessment/Plan:      The patient's current anticoagulation dose is Warfarin sodium 10 mg tabs: Take as directed by coumadin clinic..  The target INR is 2.5 - 3.5.  The next INR is due 10/07/2009.  Anticoagulation instructions were given to patient.  Results were reviewed/authorized by Weston Brass, PharmD.  She was notified by Weston Brass PharmD.         Prior Anticoagulation Instructions: INR 2.9  Continue same dose of 1/2 tablet every day except 1 tablet on Monday, Wednesday and Friday   Current Anticoagulation Instructions: INR 3.0  Continue same dose of 1/2 tablet every day except 1 tablet on Monday, Wednesday and Friday

## 2010-05-13 NOTE — Progress Notes (Signed)
Summary: test result  Phone Note Call from Patient Call back at Home Phone [phone]   Caller: Patient Reason for Call: Talk to Nurse Initial call taken by: Lorne Skeens,  October 30, 2009 3:04 PM  Follow-up for Phone Call        lmtcb Scherrie Bateman, LPN  October 30, 2009 3:44 PM  pt returned call-pls call [phone] Glynda Jaeger  October 30, 2009 3:51 PM  Additional Follow-up for Phone Call Additional follow up Details #1::        PT AWARE THAT NEEDS PULMONARY APPT FOR EVALUATION   RE ABN PFT. Additional Follow-up by: Scherrie Bateman, LPN,  October 30, 2009 4:17 PM

## 2010-05-13 NOTE — Medication Information (Signed)
Summary: rov/tm  Anticoagulant Therapy  Managed by: Eda Keys, PharmD Referring MD: Charlton Haws MD PCP: [facility] Shelter Supervising MD: Eden Emms MD, Theron Arista Indication 1: Mitral Valve Replacement (ICD-V43.3) Indication 2: Mitral Valve Disorder (ICD-424.0) Lab Used: [facility] Site: [facility] INR POC 1.3 INR RANGE 2.5 - 3.5  Dietary changes: no    Health status changes: no    Bleeding/hemorrhagic complications: no    Recent/future hospitalizations: no    Any changes in medication regimen? no    Recent/future dental: no  Any missed doses?: no       Is patient compliant with meds? yes       Allergies: No Known Drug Allergies  Anticoagulation Management History:      The patient is taking warfarin and comes in today for a routine follow up visit.  Positive risk factors for bleeding include an age of 67 years or older.  The bleeding index is 'intermediate risk'.  Positive CHADS2 values include History of CHF and History of HTN.  Negative CHADS2 values include Age > 57 years old.  The start date was 11/29/2003.  Her last INR was 9.5 ratio.  Anticoagulation responsible provider: Eden Emms MD, Theron Arista.  INR POC: 1.3.  Cuvette Lot#: [id number].  Exp: 08/2010.    Anticoagulation Management Assessment/Plan:      The patient's current anticoagulation dose is Warfarin sodium 10 mg tabs: Take as directed by coumadin clinic..  The target INR is 2.5 - 3.5.  The next INR is due 07/17/2009.  Anticoagulation instructions were given to patient.  Results were reviewed/authorized by Eda Keys, PharmD.         Prior Anticoagulation Instructions: INR 1.7 Change dose to 1/2 pill everyday except 1 pill on Mondays, Wednesdays and Fridays. Recheck in 2 weeks.   Current Anticoagulation Instructions: INR 1.3  Take 2 tablets today.  Then start new dosing schedule of 1/2 tablet on Tuesday, Thursday, and Saturday, and 1 tablet all other days.  Return to clinic in 10 days.

## 2010-05-13 NOTE — Medication Information (Signed)
Summary: rov/sp  Anticoagulant Therapy  Managed by: Bethena Midget, RN, BSN Referring MD: Charlton Haws MD PCP: [facility] Shelter Supervising MD: Riley Kill MD, Maisie Fus Indication 1: Mitral Valve Replacement (ICD-V43.3) Indication 2: Mitral Valve Disorder (ICD-424.0) Lab Used: [facility] Site: [facility] INR POC 2.0 INR RANGE 2.5 - 3.5  Dietary changes: no    Health status changes: no    Bleeding/hemorrhagic complications: no    Recent/future hospitalizations: no    Any changes in medication regimen? no    Recent/future dental: no  Any missed doses?: no       Is patient compliant with meds? yes       Allergies: No Known Drug Allergies  Anticoagulation Management History:      The patient is taking warfarin and comes in today for a routine follow up visit.  Positive risk factors for bleeding include an age of 65 years or older.  The bleeding index is 'intermediate risk'.  Positive CHADS2 values include History of CHF and History of HTN.  Negative CHADS2 values include Age > 41 years old.  The start date was 11/29/2003.  Her last INR was 9.5 ratio.  Anticoagulation responsible provider: Riley Kill MD, Maisie Fus.  INR POC: 2.0.  Cuvette Lot#: [id number].  Exp: 12/2010.    Anticoagulation Management Assessment/Plan:      The patient's current anticoagulation dose is Warfarin sodium 10 mg tabs: Take as directed by coumadin clinic..  The target INR is 2.5 - 3.5.  The next INR is due 10/21/2009.  Anticoagulation instructions were given to patient.  Results were reviewed/authorized by Bethena Midget, RN, BSN.  She was notified by Bethena Midget, RN, BSN.         Prior Anticoagulation Instructions: INR 3.0  Continue same dose of 1/2 tablet every day except 1 tablet on Monday, Wednesday and Friday   Current Anticoagulation Instructions: INR 2.0 Today take 1 1/2 pills then change dose to 1 pill everyday except 1/2 pill on Tuesdays, Thursdays and Saturdays. Recheck in 2 weeks.

## 2010-05-13 NOTE — Assessment & Plan Note (Signed)
Summary: consult for abnormal pfts.   Copy to:  Sherryl Manges Primary Provider/Referring Provider:  Corine Shelter  CC:  Pulmonary Consult.  History of Present Illness: The pt is a 64y/lo female who I have been asked to see for abnormal pfts.  She has a known CM with EF 20-25%, and has recently had pfts here in the office.  They reveal a decrease in FEV1 and FVC, but the ratio is normal.  There was no change with bronchodilators.  Unfortunately, she had a large leak on the lung volumes, and therefore unable to tell if abnormalities on spirometry are due to airtrapping (ie obstruction) vs. restriction.  Her DLCO was moderately reduced, as expected with a CM.  The pt denies any breathing issues which compromise her QOL.  She can walk distances greater than one mile at an average pace without getting winded, per the pt and her family member.  She can do her housework without difficulties.  She denies any cough or congestion, and does not produce mucus.  She has only smoked a few years in her lifetime, and denies any h/o asthma or significant second hand smoke.    Medications Prior to Update: 1)  Carvedilol 25 Mg Tabs (Carvedilol) .... Take 1 Tablet By Mouth Twice A Day 2)  Quinapril Hcl 40 Mg Tabs (Quinapril Hcl) .... Take 1 Tablet By Mouth Once A Day 3)  Digoxin 0.125 Mg Tabs (Digoxin) .Marland Kitchen.. 1 Once Daily 4)  Metformin Hcl 1000 Mg Tabs (Metformin Hcl) .Marland Kitchen.. 1 Two Times A Day 5)  Warfarin Sodium 10 Mg Tabs (Warfarin Sodium) .... Take As Directed By Coumadin Clinic. 6)  Furosemide 40 Mg Tabs (Furosemide) .... Take One Tablet By Mouth Daily As Needed 7)  Hydrocodone-Acetaminophen 5-500 Mg Tabs (Hydrocodone-Acetaminophen) .... As Needed 8)  Lipitor 80 Mg Tabs (Atorvastatin Calcium) .... Take 1 Tablet By Mouth Once A Day 9)  Trilipix 135 Mg Cpdr (Choline Fenofibrate) .... Take 1 Tablet By Mouth Once A Day 10)  Spironolactone 25 Mg Tabs (Spironolactone) .... Take1/2 Tablet By Mouth Daily 11)  Fish Oil  1000 Mg Caps (Omega-3 Fatty Acids) .... Take 1 Capsule By Mouth Once A Day  Allergies (verified): No Known Drug Allergies  Past History:  Past Medical History: Reviewed history from 09/09/2009 and no changes required. Congestive Heart Failure     --Non-ischemic CM EF 30-35% by echo 9/09 (Previous 25%)     -- s/p St. Jude single chamber ICD 4/09     --Minimal CAD by cath 2004 LAD 20% LCX 20% Ramus 30% RCA nl     --echo 5/11  EF 20-25% Severe MR s/p St. Jude mechanical MVR by Gasper Lloyd 2005 Aortic Stenosis (mild) mean gradient echo 9/09 Diabetes Type 2 Kidney Stones Hyperlipidemia Hypertension  Past Surgical History: Appendectomy 1979 AICD implantation-2009 hysterectomy 1979 heart valve 2005  Family History: Reviewed history and no changes required. heart disease: mother  Social History: Reviewed history from 04/10/2009 and no changes required. former smoker.  started in her early 54s.  less than 1 ppd. quit in her late 19s. pt is single. pt has 1 child, a son. pt lives alone. pt is disabled.  prev worked as a Lawyer x 12 years.    Review of Systems       The patient complains of tooth/dental problems and sneezing.  The patient denies shortness of breath with activity, shortness of breath at rest, productive cough, non-productive cough, coughing up blood, chest pain, irregular heartbeats, acid heartburn,  indigestion, loss of appetite, weight change, abdominal pain, difficulty swallowing, sore throat, headaches, nasal congestion/difficulty breathing through nose, itching, ear ache, anxiety, depression, hand/feet swelling, joint stiffness or pain, rash, change in color of mucus, and fever.    Vital Signs:  Patient profile:   65 year old female Height:      63 inches Weight:      182.38 pounds BMI:     32.42 O2 Sat:      97 % on Room air Temp:     98.9 degrees F oral Pulse rate:   88 / minute BP sitting:   130 / 70  (left arm) Cuff size:   regular  Vitals  Entered By: Arman Filter LPN (November 19, 2009 11:23 AM)  O2 Flow:  Room air CC: Pulmonary Consult Comments Medications reviewed with patient Arman Filter LPN  November 19, 2009 11:30 AM    Physical Exam  General:  obese female in nad Eyes:  PERRLA and EOMI.   Nose:  patent without discharge Mouth:  clear, with no exudates or lesions. Neck:  no jvd, tmg, LN Lungs:  totally clear to auscultation no wheezing or rhonchi Heart:  rrr, +click, no murmurs noted. Abdomen:  soft and nontender, bs+ Extremities:  RLE ankle edema, left ok no cyanosis  pulses intact distally Neurologic:  alert and oriented, moves all 4.   Impression & Recommendations:  Problem # 1:  COPD, MILD (ICD-496) the pt has no definite obstruction by her FEV1%, but she does have the suggestion of obstruction on her flow volume loop.  She was not able to do lung volumes to help sort thru restriction vs airtrapping, but given her flow volume loop and no evidence for low lung volumes on cxr, I suspect she has the latter.  I was not happy with her technique on her full pfts, and did simple spirometry today which showed very similiar results.  Her degree of obstruction is very mild, and the pt feels she is asymptomatic at this time.  I would not start her on a maintenance regimen currently, but could do so if she became more symptomatic.  Other Orders: Consultation Level IV (40981) Spirometry w/Graph (19147)  Patient Instructions: 1)  since you are satisfied with your breathing currently, will hold off on any type of treatment for your mild abnormality on pfts.  Please call us if there is a change.   CardioPerfect Spirometry  ID: [id number] Patient: Deborah [name], Deborah [name] DOB: 07-28-1945 Age: 65 Years Old Sex: Female Race: Black Physician: Clance Height: 63 Weight: 182.38 Status: Unconfirmed Past Medical History:  Congestive Heart Failure     --Non-ischemic CM EF 30-35% by echo 9/09 (Previous 25%)     -- s/p St.  Jude single chamber ICD 4/09     --Minimal CAD by cath 2004 LAD 20% LCX 20% Ramus 30% RCA nl     --echo 5/11  EF 20-25% Severe MR s/p St. Jude mechanical MVR by Gasper Lloyd 2005 Aortic Stenosis (mild) mean gradient echo 9/09 Diabetes Type 2 Kidney Stones Hyperlipidemia Hypertension  Recorded: 11/19/2009 11:57 AM  Parameter  Measured Predicted %Predicted FVC     1.45        2.38        61.10 FEV1     1.07        1.85        58 FEV1%   73.91        78.20  94.50 PEF    2.84        5.01        56.70   Interpretation: There is no obstruction by FEV1%. Her FVC is abnormal, but unclear if due to airtrapping or restriction.

## 2010-05-13 NOTE — Cardiovascular Report (Signed)
Summary: Office Visit   Office Visit   Imported By: Roderic Ovens 11/05/2009 15:02:01  _____________________________________________________________________  External Attachment:    Type:   Image     Comment:   External Document

## 2010-05-13 NOTE — Medication Information (Signed)
Summary: rov/kh  Anticoagulant Therapy  Managed by: Cloyde Reams, RN, BSN Referring MD: Charlton Haws MD PCP: Corine Shelter Supervising MD: Shirlee Latch MD, Margie Urbanowicz Indication 1: Mitral Valve Replacement (ICD-V43.3) Indication 2: Mitral Valve Disorder (ICD-424.0) Lab Used: [facility] Site: [facility] INR POC 2.3 INR RANGE 2.5 - 3.5  Dietary changes: yes       Details: Incr in Vit K  Health status changes: no    Bleeding/hemorrhagic complications: no    Recent/future hospitalizations: no    Any changes in medication regimen? no    Recent/future dental: no  Any missed doses?: no       Is patient compliant with meds? yes       Allergies: No Known Drug Allergies  Anticoagulation Management History:      The patient is taking warfarin and comes in today for a routine follow up visit.  Positive risk factors for bleeding include an age of 67 years or older.  The bleeding index is 'intermediate risk'.  Positive CHADS2 values include History of CHF and History of HTN.  Negative CHADS2 values include Age > 84 years old.  The start date was 11/29/2003.  Her last INR was 9.5 ratio.  Anticoagulation responsible provider: Shirlee Latch MD, Laurella Tull.  INR POC: 2.3.  Cuvette Lot#: [id number].  Exp: 04/2011.    Anticoagulation Management Assessment/Plan:      The patient's current anticoagulation dose is Warfarin sodium 10 mg tabs: Take as directed by coumadin clinic..  The target INR is 2.5 - 3.5.  The next INR is due 05/21/2010.  Anticoagulation instructions were given to patient.  Results were reviewed/authorized by Cloyde Reams, RN, BSN.  She was notified by Bethena Midget, RN, BSN.         Prior Anticoagulation Instructions: INR 2.3  Take 1 and 1/2 tablets today then continue current dose of 1 tablet everyday except 1/2 tablet on Tuesdays, Thursdays, and Saturdays. Recheck INR in 3 weeks.   Current Anticoagulation Instructions: INR 2.3  Start taking 1 tablet daily except 1/2 tablet on  Tuesdays and Thursdays.  Recheck in 3 weeks.

## 2010-05-13 NOTE — Medication Information (Signed)
Summary: rov/tm  Anticoagulant Therapy  Managed by: Bethena Midget, RN, BSN Referring MD: Charlton Haws MD PCP: Corine Shelter Supervising MD: Johney Frame MD, Fayrene Fearing Indication 1: Mitral Valve Replacement (ICD-V43.3) Indication 2: Mitral Valve Disorder (ICD-424.0) Lab Used: [facility] Site: [facility] INR POC 1.7 INR RANGE 2.5 - 3.5           Allergies: No Known Drug Allergies  Anticoagulation Management History:      The patient is taking warfarin and comes in today for a routine follow up visit.  Positive risk factors for bleeding include an age of 67 years or older.  The bleeding index is 'intermediate risk'.  Positive CHADS2 values include History of CHF and History of HTN.  Negative CHADS2 values include Age > 64 years old.  The start date was 11/29/2003.  Her last INR was 9.5 ratio.  Anticoagulation responsible provider: Kinsley Nicklaus MD, Fayrene Fearing.  INR POC: 1.7.  Cuvette Lot#: [id number].  Exp: 08/2010.    Anticoagulation Management Assessment/Plan:      The patient's current anticoagulation dose is Warfarin sodium 10 mg tabs: Take as directed by coumadin clinic..  The target INR is 2.5 - 3.5.  The next INR is due 07/08/2009.  Anticoagulation instructions were given to patient.  Results were reviewed/authorized by Bethena Midget, RN, BSN.  She was notified by Bethena Midget, RN, BSN.         Prior Anticoagulation Instructions: INR 3.1 Continue 5mg s everyday except 10mg s on Mondays and Fridays. Recheck in 4 weeks.   Current Anticoagulation Instructions: INR 1.7 Change dose to 1/2 pill everyday except 1 pill on Mondays, Wednesdays and Fridays. Recheck in 2 weeks.   Appended Document: [facility]    Anticoagulant Therapy  Managed by: Bethena Midget, RN, BSN Referring MD: Charlton Haws MD PCP: Corine Shelter Supervising MD: Johney Frame MD, Fayrene Fearing Indication 1: Mitral Valve Replacement (ICD-V43.3) Indication 2: Mitral Valve Disorder (ICD-424.0) [facility]  Site: [facility] INR RANGE 2.5 - 3.5  Dietary changes: no    Health status changes: no    Bleeding/hemorrhagic complications: no    Recent/future hospitalizations: no    Any changes in medication regimen? no    Recent/future dental: no  Any missed doses?: no       Is patient compliant with meds? yes       Allergies: No Known Drug Allergies  Anticoagulation Management History:      Positive risk factors for bleeding include an age of 54 years or older.  The bleeding index is 'intermediate risk'.  Positive CHADS2 values include History of CHF and History of HTN.  Negative CHADS2 values include Age > 7 years old.  The start date was 11/29/2003.  Her last INR was 9.5 ratio.  Anticoagulation responsible provider: Lorene Samaan MD, Fayrene Fearing.  Exp: 08/2010.    Anticoagulation Management Assessment/Plan:      The patient's current anticoagulation dose is Warfarin sodium 10 mg tabs: Take as directed by coumadin clinic..  The target INR is 2.5 - 3.5.  The next INR is due 07/08/2009.  Anticoagulation instructions were given to patient.  Results were reviewed/authorized by Bethena Midget, RN, BSN.         Prior Anticoagulation Instructions: INR 1.7 Change dose to 1/2 pill everyday except 1 pill on Mondays, Wednesdays and Fridays. Recheck in 2 weeks.

## 2010-05-13 NOTE — Medication Information (Signed)
Summary: rov/tm  Anticoagulant Therapy  Managed by: Shelby Dubin, PharmD, BCPS, CPP Referring MD: Charlton Haws MD PCP: Corine Shelter Supervising MD: Jens Som MD, Arlys John Indication 1: Mitral Valve Replacement (ICD-V43.3) Indication 2: Mitral Valve Disorder (ICD-424.0) Lab Used: [facility] Site: [facility] INR RANGE 2.5 - 3.5  Dietary changes: no    Health status changes: no    Bleeding/hemorrhagic complications: no    Recent/future hospitalizations: no    Any changes in medication regimen? no    Recent/future dental: no  Any missed doses?: no       Is patient compliant with meds? yes       Allergies (verified): No Known Drug Allergies  Anticoagulation Management History:      The patient is taking warfarin and comes in today for a routine follow up visit.  Positive risk factors for bleeding include an age of 67 years or older.  The bleeding index is 'intermediate risk'.  Positive CHADS2 values include History of CHF and History of HTN.  Negative CHADS2 values include Age > 92 years old.  The start date was 11/29/2003.  Her last INR was 9.5 ratio.  Anticoagulation responsible provider: Jens Som MD, Arlys John.  Cuvette Lot#: [id number].  Exp: 07/2010.    Anticoagulation Management Assessment/Plan:      The patient's current anticoagulation dose is Warfarin sodium 10 mg tabs: Take as directed by coumadin clinic..  The target INR is 2.5 - 3.5.  The next INR is due 05/26/2009.  Anticoagulation instructions were given to patient.  Results were reviewed/authorized by Shelby Dubin, PharmD, BCPS, CPP.  She was notified by Ysidro Evert, Pharm D Candidate.         Prior Anticoagulation Instructions: INR 1.6 Today take 1 pill and tomorrow take 1 pill  then take 1/2 pill everyday except 1 pill on Mondays and Fridays.   Current Anticoagulation Instructions: INR: 3.5 Continue with same dose of 5mg  tablet daily except 10mg  on Mondays and Fridays Eat green leafy vegetables today Recheck  in 4 weeks

## 2010-05-13 NOTE — Medication Information (Signed)
Summary: rov/ewj  Anticoagulant Therapy  Managed by: Weston Brass, PharmD Referring MD: Charlton Haws MD PCP: Corine Shelter Supervising MD: Shirlee Latch MD, Freida Busman Indication 1: Mitral Valve Replacement (ICD-V43.3) Indication 2: Mitral Valve Disorder (ICD-424.0) Lab Used: [facility] Site: [facility] INR POC 2.9 INR RANGE 2.5 - 3.5  Dietary changes: no    Health status changes: no    Bleeding/hemorrhagic complications: no    Recent/future hospitalizations: yes       Details: Went to ER over the weekend with high BG did not stay overnight.  Any changes in medication regimen? no    Recent/future dental: no  Any missed doses?: no       Is patient compliant with meds? yes       Allergies: No Known Drug Allergies  Anticoagulation Management History:      The patient is taking warfarin and comes in today for a routine follow up visit.  Positive risk factors for bleeding include an age of 67 years or older.  The bleeding index is 'intermediate risk'.  Positive CHADS2 values include History of CHF and History of HTN.  Negative CHADS2 values include Age > 80 years old.  The start date was 11/29/2003.  Her last INR was 9.5 ratio.  Anticoagulation responsible provider: Shirlee Latch MD, [place].  INR POC: 2.9.  Cuvette Lot#: [id number].  Exp: 02/2011.    Anticoagulation Management Assessment/Plan:      The patient's current anticoagulation dose is Warfarin sodium 10 mg tabs: Take as directed by coumadin clinic..  The target INR is 2.5 - 3.5.  The next INR is due 02/09/2010.  Anticoagulation instructions were given to patient.  Results were reviewed/authorized by Weston Brass, PharmD.  She was notified by Ilean Skill D candidate.         Prior Anticoagulation Instructions: INR 4.2  Skip today's dosage of Coumadin, then resume same dosage 1 tablet daily except 1/2 tablet on Tuesdays, Thursdays, and Saturdays.  Recheck in 3 weeks.    Current Anticoagulation Instructions: INR 2.9  Patient  will continue to take 1 tablet everyday except 1/2 tablet on Tuesday, Thursday, and Saturday. Recheck in 4 weeks.

## 2010-05-13 NOTE — Assessment & Plan Note (Signed)
Summary: 3 month rov.sl   Visit Type:  Follow-up Referring Provider:  Sherryl Manges Primary Provider:  Corine Shelter  CC:  no complaints.  History of Present Illness: Deborah [name] is a very pleasant 65 year old woman with a history of congestive heart failure, secondary to nonischemic cardiomyopathy, ejection fraction around 25% s/p St Jude single chamber ICD.  She also has a history of mitral valve disease and status post mitral valve replacement with a St. Jude valve in 2005.  Remainder of her history is notable for diabetes, hypertension, hyperlipidemia, and kidney stones.   We saw her recently and I was concerned about worsening functional capacity. CPX performed June 2011 pVO2 16.8 slope 26.1 RER 1.11 O2 pulse 90% predicted O2 sat 87% at peak with possible hypoventilation.  Returns today for routine f/u . Doing well. No change. Can walk all around grocery store and [place] without stopping.  Denies CP or SOB. No edema. Compliant with all meds. No bleeding with coumadin. Taking metolazone rarely as needed for fluid overload.   Current Medications (verified): 1)  Carvedilol 25 Mg Tabs (Carvedilol) .... Take 1 Tablet By Mouth Twice A Day 2)  Quinapril Hcl 40 Mg Tabs (Quinapril Hcl) .... Take 1 Tablet By Mouth Once A Day 3)  Digoxin 0.125 Mg Tabs (Digoxin) .Marland Kitchen.. 1 Once Daily 4)  Metformin Hcl 1000 Mg Tabs (Metformin Hcl) .Marland Kitchen.. 1 Two Times A Day 5)  Warfarin Sodium 10 Mg Tabs (Warfarin Sodium) .... Take As Directed By Coumadin Clinic. 6)  Hydrocodone-Acetaminophen 5-500 Mg Tabs (Hydrocodone-Acetaminophen) .... As Needed 7)  Trilipix 135 Mg Cpdr (Choline Fenofibrate) .... Take 1 Tablet By Mouth Once A Day 8)  Spironolactone 25 Mg Tabs (Spironolactone) .... Take1/2 Tablet By Mouth Daily 9)  Fish Oil 1000 Mg Caps (Omega-3 Fatty Acids) .... Take 1 Capsule By Mouth Once A Day  Allergies (verified): No Known Drug Allergies  Past History:  Past Medical History: Last updated:  09/09/2009 Congestive Heart Failure     --Non-ischemic CM EF 30-35% by echo 9/09 (Previous 25%)     -- s/p St. Jude single chamber ICD 4/09     --Minimal CAD by cath 2004 LAD 20% LCX 20% Ramus 30% RCA nl     --echo 5/11  EF 20-25% Severe MR s/p St. Jude mechanical MVR by Gasper Lloyd 2005 Aortic Stenosis (mild) mean gradient echo 9/09 Diabetes Type 2 Kidney Stones Hyperlipidemia Hypertension  Review of Systems       As per HPI and past medical history; otherwise all systems negative.   Vital Signs:  Patient profile:   65 year old female Height:      63 inches Weight:      182 pounds BMI:     32.36 Pulse rate:   84 / minute BP sitting:   122 / 80  (left arm) Cuff size:   regular  Vitals Entered By: Hardin Negus, RMA (February 23, 2010 11:58 AM)  Physical Exam  General:  Well appearing no distress HEENT Normal Neck: JVP 6 carotids were brisk. no bruits Lungs were clear.  PMI laterally displaced Heart sounds were regular with mechanical S1 with 2/6 systolic murmur at RSB. +soft s3 Abdomen was soft with active bowel sounds. There is no clubbing cyanosis or edema. Skin Warm and dry Neuro  oriented x 3  cranial nerves II-XII grossly intact. moves all 4 extremities without difficulty.     ICD Specifications Following MD:  Sherryl Manges, MD     Referring MD:  Chasty Randal ICD Vendor:  St Jude     ICD Model Number:  [id number]     ICD Serial Number:  [id number] ICD DOI:  07/20/2007     ICD Implanting MD:  Sherryl Manges, MD  Lead 1:    Location: RV     DOI: 07/20/2007     Model #: 7120     Serial #: [id number]     Status: active  Indications::  NICM   ICD Follow Up ICD Dependent:  No      Episodes Coumadin:  Yes  Brady Parameters Mode VVI     Lower Rate Limit:  40      Tachy Zones VF:  240     VT:  210     VT1:  171     Impression & Recommendations:  Problem # 1:  SYSTOLIC HEART FAILURE, CHRONIC (ICD-428.22) Doing well. Now NYHA II-early III despite prominent S3 on  exam. Volume status looks good. On good meds. Will continue to follow closely for need for advanced therapies.   Problem # 2:  MITRAL VALVE DISORDERS (ICD-424.0) Stable. Continue coumadin.   Other Orders: EKG w/ Interpretation (93000)  Patient Instructions: 1)  Your physician recommends that you schedule a follow-up appointment in: 3 months

## 2010-05-13 NOTE — Letter (Signed)
Summary: Appointment - Reschedule  [facility]     [place], [place]    Phone:   Fax:      July 10, 2009 MRN: [id number]   Deborah [name] [address] [place], [place]  [address]   Dear Deborah [name],   Due to a change in our office schedule, your appointment on  07-10-2009  at  2:30              must be changed.  It is very important that we reach you to reschedule this appointment. We look forward to participating in your health care needs. Please contact us at the number listed above at your earliest convenience to reschedule this appointment.     Sincerely,       Lorne Skeens  [facility] Scheduling Team

## 2010-05-19 ENCOUNTER — Ambulatory Visit: Payer: Self-pay | Admitting: Internal Medicine

## 2010-05-19 NOTE — Cardiovascular Report (Signed)
Summary: Office Visit   Office Visit   Imported By: Roderic Ovens 05/13/2010 10:28:16  _____________________________________________________________________  External Attachment:    Type:   Image     Comment:   External Document

## 2010-05-25 ENCOUNTER — Encounter: Payer: Self-pay | Admitting: Cardiovascular Disease

## 2010-05-25 ENCOUNTER — Encounter (INDEPENDENT_AMBULATORY_CARE_PROVIDER_SITE_OTHER): Payer: No Typology Code available for payment source

## 2010-05-25 DIAGNOSIS — Z952 Presence of prosthetic heart valve: Secondary | ICD-10-CM

## 2010-05-25 DIAGNOSIS — I059 Rheumatic mitral valve disease, unspecified: Secondary | ICD-10-CM

## 2010-05-25 DIAGNOSIS — Z7901 Long term (current) use of anticoagulants: Secondary | ICD-10-CM

## 2010-05-25 LAB — CONVERTED CEMR LAB: POC INR: 2.3

## 2010-05-26 ENCOUNTER — Ambulatory Visit: Payer: Self-pay | Admitting: Internal Medicine

## 2010-06-02 NOTE — Medication Information (Signed)
Summary: Coumadin Clinic  Anticoagulant Therapy  Managed by: Weston Brass, PharmD Referring MD: Charlton Haws MD PCP: Corine Shelter Supervising MD: Eden Emms MD, Theron Arista Indication 1: Mitral Valve Replacement (ICD-V43.3) Indication 2: Mitral Valve Disorder (ICD-424.0) Lab Used: [facility] Site: [facility] INR POC 2.3 INR RANGE 2.5 - 3.5  Dietary changes: no    Health status changes: no    Bleeding/hemorrhagic complications: no    Recent/future hospitalizations: no    Any changes in medication regimen? yes       Details: took amoxicillin last week for ?cold  Recent/future dental: no  Any missed doses?: no       Is patient compliant with meds? yes       Allergies: No Known Drug Allergies  Anticoagulation Management History:      The patient is taking warfarin and comes in today for a routine follow up visit.  Positive risk factors for bleeding include an age of 67 years or older.  The bleeding index is 'intermediate risk'.  Positive CHADS2 values include History of CHF and History of HTN.  Negative CHADS2 values include Age > 62 years old.  The start date was 11/29/2003.  Her last INR was 9.5 ratio.  Anticoagulation responsible provider: Eden Emms MD, Theron Arista.  INR POC: 2.3.  Exp: 04/2011.    Anticoagulation Management Assessment/Plan:      The patient's current anticoagulation dose is Warfarin sodium 10 mg tabs: Take as directed by coumadin clinic..  The target INR is 2.5 - 3.5.  The next INR is due 06/15/2010.  Anticoagulation instructions were given to patient.  Results were reviewed/authorized by Weston Brass, PharmD.  She was notified by Weston Brass PharmD.         Prior Anticoagulation Instructions: INR 2.3  Start taking 1 tablet daily except 1/2 tablet on Tuesdays and Thursdays.  Recheck in 3 weeks.    Current Anticoagulation Instructions: INR 2.3  Increase dose to 1 tablet every day except 1/2 tablet on Thursday.  Recheck INR in 3 weeks.

## 2010-06-11 ENCOUNTER — Encounter: Payer: Self-pay | Admitting: Cardiovascular Disease

## 2010-06-11 ENCOUNTER — Ambulatory Visit: Payer: Self-pay | Admitting: Internal Medicine

## 2010-06-11 ENCOUNTER — Encounter (INDEPENDENT_AMBULATORY_CARE_PROVIDER_SITE_OTHER): Payer: No Typology Code available for payment source

## 2010-06-11 DIAGNOSIS — I059 Rheumatic mitral valve disease, unspecified: Secondary | ICD-10-CM

## 2010-06-11 DIAGNOSIS — Z954 Presence of other heart-valve replacement: Secondary | ICD-10-CM

## 2010-06-11 DIAGNOSIS — Z7901 Long term (current) use of anticoagulants: Secondary | ICD-10-CM

## 2010-06-11 LAB — CONVERTED CEMR LAB: POC INR: 3.7

## 2010-06-17 ENCOUNTER — Encounter: Payer: Self-pay | Admitting: Internal Medicine

## 2010-06-17 NOTE — Medication Information (Signed)
Summary: rov/sp  Anticoagulant Therapy  Managed by: Windell Hummingbird, RN Referring MD: Charlton Haws MD PCP: Corine Shelter Supervising MD: Clifton James MD, Cristal Deer Indication 1: Mitral Valve Replacement (ICD-V43.3) Indication 2: Mitral Valve Disorder (ICD-424.0) Lab Used: [facility] Site: [facility] INR POC 3.7 INR RANGE 2.5 - 3.5  Dietary changes: no    Health status changes: no    Bleeding/hemorrhagic complications: no    Recent/future hospitalizations: no    Any changes in medication regimen? no    Recent/future dental: no  Any missed doses?: no       Is patient compliant with meds? yes       Allergies: No Known Drug Allergies  Anticoagulation Management History:      The patient is taking warfarin and comes in today for a routine follow up visit.  Positive risk factors for bleeding include an age of 67 years or older.  The bleeding index is 'intermediate risk'.  Positive CHADS2 values include History of CHF and History of HTN.  Negative CHADS2 values include Age > 67 years old.  The start date was 11/29/2003.  Her last INR was 9.5 ratio.  Anticoagulation responsible provider: Clifton James MD, Cristal Deer.  INR POC: 3.7.  Cuvette Lot#: [id number].  Exp: 04/2011.    Anticoagulation Management Assessment/Plan:      The patient's current anticoagulation dose is Warfarin sodium 10 mg tabs: Take as directed by coumadin clinic..  The target INR is 2.5 - 3.5.  The next INR is due 07/01/2010.  Anticoagulation instructions were given to patient.  Results were reviewed/authorized by Windell Hummingbird, RN.  She was notified by Windell Hummingbird, RN.         Prior Anticoagulation Instructions: INR 2.3  Increase dose to 1 tablet every day except 1/2 tablet on Thursday.  Recheck INR in 3 weeks.   Current Anticoagulation Instructions: INR 3.7 Take 1/2 tablet today.  Then resume taking 1tablet every day, except take 1/2 on Thursdays. Recheck in 3 weeks.

## 2010-06-23 LAB — GLUCOSE, CAPILLARY: Glucose-Capillary: 105 mg/dL — ABNORMAL HIGH (ref 70–99)

## 2010-06-23 LAB — POCT I-STAT, CHEM 8
BUN: 26 mg/dL — ABNORMAL HIGH (ref 6–23)
Chloride: 105 mEq/L (ref 96–112)
Creatinine, Ser: 1 mg/dL (ref 0.4–1.2)
Glucose, Bld: 105 mg/dL — ABNORMAL HIGH (ref 70–99)
Hemoglobin: 13.6 g/dL (ref 12.0–15.0)
Potassium: 3.6 mEq/L (ref 3.5–5.1)
Sodium: 141 mEq/L (ref 135–145)
TCO2: 32 mmol/L (ref 0–100)

## 2010-06-23 LAB — POCT CARDIAC MARKERS
CKMB, poc: 3.4 ng/mL (ref 1.0–8.0)
Troponin i, poc: <0.05 ng/mL (ref 0.00–0.09)

## 2010-06-23 LAB — PROTIME-INR: INR: 3.32 — ABNORMAL HIGH (ref 0.00–1.49)

## 2010-06-24 LAB — URINALYSIS, ROUTINE W REFLEX MICROSCOPIC
Glucose, UA: NEGATIVE mg/dL
Leukocytes, UA: NEGATIVE
Protein, ur: 30 mg/dL — AB
pH: 5 (ref 5.0–8.0)

## 2010-06-24 LAB — URINE MICROSCOPIC-ADD ON

## 2010-06-24 LAB — GLUCOSE, CAPILLARY: Glucose-Capillary: 133 mg/dL — ABNORMAL HIGH (ref 70–99)

## 2010-07-01 ENCOUNTER — Ambulatory Visit (INDEPENDENT_AMBULATORY_CARE_PROVIDER_SITE_OTHER): Payer: No Typology Code available for payment source | Admitting: *Deleted

## 2010-07-01 ENCOUNTER — Ambulatory Visit: Payer: Self-pay | Admitting: Internal Medicine

## 2010-07-01 DIAGNOSIS — I059 Rheumatic mitral valve disease, unspecified: Secondary | ICD-10-CM

## 2010-07-01 DIAGNOSIS — Z7901 Long term (current) use of anticoagulants: Secondary | ICD-10-CM | POA: Insufficient documentation

## 2010-07-01 DIAGNOSIS — Z954 Presence of other heart-valve replacement: Secondary | ICD-10-CM

## 2010-07-01 DIAGNOSIS — Z952 Presence of prosthetic heart valve: Secondary | ICD-10-CM

## 2010-07-01 NOTE — Patient Instructions (Signed)
Skip today's dosage of Coumadin, then start taking 1 tablet daily except 1/2 tablet on Mondays and Thursdays.  Recheck in 3 weeks.

## 2010-07-19 ENCOUNTER — Ambulatory Visit: Payer: Self-pay | Admitting: Internal Medicine

## 2010-07-22 ENCOUNTER — Encounter: Payer: Self-pay | Admitting: Internal Medicine

## 2010-07-22 ENCOUNTER — Ambulatory Visit (INDEPENDENT_AMBULATORY_CARE_PROVIDER_SITE_OTHER): Payer: No Typology Code available for payment source | Admitting: *Deleted

## 2010-07-22 ENCOUNTER — Ambulatory Visit (INDEPENDENT_AMBULATORY_CARE_PROVIDER_SITE_OTHER): Payer: No Typology Code available for payment source | Admitting: Internal Medicine

## 2010-07-22 VITALS — BP 130/80 | HR 86 | Resp 18 | Ht 63.0 in | Wt 184.8 lb

## 2010-07-22 DIAGNOSIS — I359 Nonrheumatic aortic valve disorder, unspecified: Secondary | ICD-10-CM

## 2010-07-22 DIAGNOSIS — Z7901 Long term (current) use of anticoagulants: Secondary | ICD-10-CM

## 2010-07-22 DIAGNOSIS — I5022 Chronic systolic (congestive) heart failure: Secondary | ICD-10-CM

## 2010-07-22 DIAGNOSIS — Z954 Presence of other heart-valve replacement: Secondary | ICD-10-CM

## 2010-07-22 DIAGNOSIS — Z952 Presence of prosthetic heart valve: Secondary | ICD-10-CM

## 2010-07-22 DIAGNOSIS — R110 Nausea: Secondary | ICD-10-CM | POA: Insufficient documentation

## 2010-07-22 DIAGNOSIS — I059 Rheumatic mitral valve disease, unspecified: Secondary | ICD-10-CM

## 2010-07-22 MED ORDER — SPIRONOLACTONE 25 MG PO TABS: 25.0000 mg | ORAL_TABLET | Freq: Every day | ORAL | Status: DC

## 2010-07-22 NOTE — Assessment & Plan Note (Signed)
Stable. Asymptomatic. F/u echo in 6 months.

## 2010-07-22 NOTE — Progress Notes (Signed)
HPI:  Ms. Polidore is a very pleasant 65 year old woman with a history of congestive heart failure, secondary to nonischemic cardiomyopathy, ejection fraction around 25% s/p St Jude single chamber ICD.  She also has a history of mitral valve disease and status post mitral valve replacement with a St. Jude valve in 2005.  Remainder of her history is notable for diabetes, hypertension, hyperlipidemia, and kidney stones.   CPX performed June 2011 pVO2 16.8 slope 26.1 RER 1.11 O2 pulse 90% predicted O2 sat 87% at peak with possible hypoventilation.  Returns today for routine f/u with her family. Doing well. Can walk all around grocery store and [employer] without stopping.  Denies CP or SOB. No edema. Compliant with all meds. No bleeding with coumadin. Taking metolazone rarely as needed for fluid overload. Only really complaint is that on some mornings she feels nauseated and occasionally throws up. Has been going on for months. Has gained 10 pounds over past year which she attributes to her eating.    ROS: All systems negative except as listed in HPI, PMH and Problem List.  Past Medical History  Diagnosis Date  . CHF (congestive heart failure)     --Non-ischemic CM EF 30-35% by echo 9/09 (Previous 25%)  --s/p St. Jude single chamber ICD 04/09 --Minimal  CAD by cath 2004 LAD 20% LCX 20% Ramus 30% RCA nl --echo 05/11 EF 20-25%  . MR (congenital mitral regurgitation)     Severe MR s/p St. Jude mechanical MVR by Gasper Lloyd 2005  . Aortic stenosis, mild     mean gradient echo 09/09  . Diabetes mellitus     type 2  . Kidney stones   . Hyperlipidemia   . Hypertension     Current Outpatient Prescriptions  Medication Sig Dispense Refill  . carvedilol (COREG) 25 MG tablet Take 25 mg by mouth 2 (two) times daily with a meal.        . Choline Fenofibrate (TRILIPIX) 135 MG capsule Take 135 mg by mouth daily.        . digoxin (LANOXIN) 0.125 MG tablet Take 125 mcg by mouth daily.        . fish  oil-omega-3 fatty acids 1000 MG capsule Take 2 g by mouth daily.        Marland Kitchen HYDROcodone-acetaminophen (VICODIN) 5-500 MG per tablet Take 1 tablet by mouth as needed.        . metFORMIN (GLUCOPHAGE) 1000 MG tablet Take 1,000 mg by mouth 2 (two) times daily with a meal.        . quinapril (ACCUPRIL) 40 MG tablet Take 40 mg by mouth daily.        Marland Kitchen spironolactone (ALDACTONE) 25 MG tablet Take 25 mg by mouth. Take 1/2 tablet daily       . warfarin (COUMADIN) 10 MG tablet Take by mouth as directed.           PHYSICAL EXAM: Filed Vitals:   07/22/10 1126  BP: 130/80  Pulse: 86  Resp: 18   General:    Well appearing no distress HEENT Normal Neck: JVP 6-7 + prominent cv waves carotids were brisk. no bruits Lungs were clear.  PMI laterally displaced Heart sounds were regular with mechanical S1 with 2/6 systolic murmur at RSB.  Abdomen was soft with active bowel sounds. There is no clubbing cyanosis. Mild edema on R ankle at site of old injury Skin Warm and dry Neuro  oriented x 3  cranial nerves II-XII grossly intact.  moves all 4 extremities without difficulty.  ECG: SR 85 IVCD LVH   ASSESSMENT & PLAN:

## 2010-07-22 NOTE — Assessment & Plan Note (Signed)
S/p mechanical MVR. Doing well. Continue coumadin. F/u echo in 6 months.

## 2010-07-22 NOTE — Assessment & Plan Note (Signed)
I am wondering if this may be diabetic gastroparesis. F/u with PCP +/- GI, as needed.

## 2010-07-22 NOTE — Patient Instructions (Signed)
Increase Spironolactone to 25mg  1 tab daily Your physician recommends that you return for lab work in: 1 week (bmet [phone]) Your physician has requested that you have an echocardiogram. Echocardiography is a painless test that uses sound waves to create images of your heart. It provides your doctor with information about the size and shape of your heart and how well your heart's chambers and valves are working. This procedure takes approximately one hour. There are no restrictions for this procedure.  NEEDS IN 6 MONTHS. Your physician wants you to follow-up in: 6 months. You will receive a reminder letter in the mail two months in advance. If you don't receive a letter, please call our office to schedule the follow-up appointment.

## 2010-07-22 NOTE — Assessment & Plan Note (Signed)
Doing well. NYHA II. Volume status perhaps slightly up. Will increase spironolactone to 25qd. Check labs in a week or two. Otherwise on good meds.

## 2010-08-02 ENCOUNTER — Encounter: Payer: Self-pay | Admitting: Internal Medicine

## 2010-08-02 ENCOUNTER — Ambulatory Visit (INDEPENDENT_AMBULATORY_CARE_PROVIDER_SITE_OTHER): Payer: No Typology Code available for payment source | Admitting: Internal Medicine

## 2010-08-02 ENCOUNTER — Other Ambulatory Visit (INDEPENDENT_AMBULATORY_CARE_PROVIDER_SITE_OTHER): Payer: No Typology Code available for payment source | Admitting: *Deleted

## 2010-08-02 DIAGNOSIS — I428 Other cardiomyopathies: Secondary | ICD-10-CM

## 2010-08-02 DIAGNOSIS — Z95810 Presence of automatic (implantable) cardiac defibrillator: Secondary | ICD-10-CM

## 2010-08-02 DIAGNOSIS — I5022 Chronic systolic (congestive) heart failure: Secondary | ICD-10-CM

## 2010-08-02 DIAGNOSIS — I10 Essential (primary) hypertension: Secondary | ICD-10-CM

## 2010-08-02 LAB — BASIC METABOLIC PANEL
BUN: 19 mg/dL (ref 6–23)
CO2: 28 mEq/L (ref 19–32)
Calcium: 9.1 mg/dL (ref 8.4–10.5)
Creatinine, Ser: 0.6 mg/dL (ref 0.4–1.2)
GFR: 136.11 mL/min (ref >60.00)
Glucose, Bld: 116 mg/dL — ABNORMAL HIGH (ref 70–99)

## 2010-08-02 NOTE — Patient Instructions (Signed)
Your physician wants you to follow-up in: 3 months with device clinic and 12 months with Dr Taylor You will receive a reminder letter in the mail two months in advance. If you don't receive a letter, please call our office to schedule the follow-up appointment.  

## 2010-08-02 NOTE — Assessment & Plan Note (Signed)
Her device is working normally. We'll recheck in several months. 

## 2010-08-02 NOTE — Assessment & Plan Note (Signed)
Her symptoms remain class II. She will continue her current medications. She will maintain a low-sodium diet.

## 2010-08-02 NOTE — Assessment & Plan Note (Signed)
Her blood pressure is well controlled today. She will continue her current medications and maintain a low-sodium diet.

## 2010-08-02 NOTE — Progress Notes (Signed)
HPI [name] returns today for followup. She is a pleasant 65 year old woman with a nonischemic cardiomyopathy, class II congestive heart failure, ejection fraction 25%. She is status post mitral valve replacement. She is on chronic Coumadin therapy. She denies chest pain or shortness of breath or peripheral edema. She has had no ICD therapy. No Known Allergies   Current Outpatient Prescriptions  Medication Sig Dispense Refill  . carvedilol (COREG) 25 MG tablet Take 25 mg by mouth 2 (two) times daily with a meal.        . Choline Fenofibrate (TRILIPIX) 135 MG capsule Take 135 mg by mouth daily.        . digoxin (LANOXIN) 0.125 MG tablet Take 125 mcg by mouth daily.        . fish oil-omega-3 fatty acids 1000 MG capsule Take 2 g by mouth daily.        Marland Kitchen HYDROcodone-acetaminophen (VICODIN) 5-500 MG per tablet Take 1 tablet by mouth as needed.        . metFORMIN (GLUCOPHAGE) 1000 MG tablet Take 1,000 mg by mouth 2 (two) times daily with a meal.        . quinapril (ACCUPRIL) 40 MG tablet Take 40 mg by mouth daily.        Marland Kitchen spironolactone (ALDACTONE) 25 MG tablet Take 1 tablet (25 mg total) by mouth daily.  30 tablet  6  . warfarin (COUMADIN) 10 MG tablet Take by mouth as directed.           Past Medical History  Diagnosis Date  . CHF (congestive heart failure)     --Non-ischemic CM EF 30-35% by echo 9/09 (Previous 25%)  --s/p St. Jude single chamber ICD 04/09 --Minimal  CAD by cath 2004 LAD 20% LCX 20% Ramus 30% RCA nl --echo 05/11 EF 20-25%  . MR (congenital mitral regurgitation)     Severe MR s/p St. Jude mechanical MVR by Gasper Lloyd 2005  . Aortic stenosis, mild     mean gradient echo 09/09  . Diabetes mellitus     type 2  . Kidney stones   . Hyperlipidemia   . Hypertension     ROS:   All systems reviewed and negative except as noted in the HPI.   Past Surgical History  Procedure Date  . Appendectomy 1979  . Cardiac defibrillator placement 2009  . Insert / replace /  remove pacemaker   . Cardiac valve surgery 2005     Family History  Problem Relation Age of Onset  . Heart disease Mother      History   Social History  . Marital Status: Single    Spouse Name: N/A    Number of Children: N/A  . Years of Education: N/A   Occupational History  . CNA     disabled   Social History Main Topics  . Smoking status: Former Smoker    Quit date: 06/16/1984  . Smokeless tobacco: Never Used  . Alcohol Use: Not on file  . Drug Use: Not on file  . Sexually Active: Not on file   Other Topics Concern  . Not on file   Social History Narrative   Former smoker, started in early 20's - less than 1 ppd / Quit in her late 20'sPt is singlePt has one child, a sonPt lives alonePt is disabled, previously worked as Lawyer x 12 years     BP 124/84  Pulse 80  Ht 5\' 3"  (1.6 m)  Wt 184 lb (  83.462 kg)  BMI 32.59 kg/m2  Physical Exam:  Well appearing NAD HEENT: Unremarkable Neck:  No JVD, no thyromegally Lymphatics:  No adenopathy Back:  No CVA tenderness Lungs:  Clear. Well-healed defibrillator incision. HEART:  Regular rate rhythm, no murmurs, no rubs, no clicks. Mechanical S1 closure. Abd:  Flat, positive bowel sounds, no organomegally, no rebound, no guarding Ext:  2 plus pulses, no edema, no cyanosis, no clubbing Skin:  No rashes no nodules Neuro:  CN II through XII intact, motor grossly intact  DEVICE  Normal device function.  See PaceArt for details.   Assess/Plan:

## 2010-08-09 NOTE — Progress Notes (Signed)
Pt aware of results of bmp

## 2010-08-19 ENCOUNTER — Ambulatory Visit (INDEPENDENT_AMBULATORY_CARE_PROVIDER_SITE_OTHER): Payer: No Typology Code available for payment source | Admitting: *Deleted

## 2010-08-19 DIAGNOSIS — I059 Rheumatic mitral valve disease, unspecified: Secondary | ICD-10-CM

## 2010-08-19 DIAGNOSIS — Z952 Presence of prosthetic heart valve: Secondary | ICD-10-CM

## 2010-08-24 NOTE — Assessment & Plan Note (Signed)
[facility]                            CARDIOLOGY OFFICE NOTE   JONNE, ROTE                       MRN:          [id number]  DATE:06/12/2007                            DOB:          1945-08-24    PRIMARY CARE PHYSICIAN:  Dr. Corine Shelter.   INTERVAL HISTORY:  Deborah [name] is a very pleasant 61-year woman with a  history of congestive heart failure secondary to a nonischemic  cardiomyopathy, ejection fraction of 25%.  She also has history of  mitral valve disease and status post a mitral valve replacement with St.  Jude valve in 2005.  Remainder of history is notable for type 2  diabetes, hypertension, hyperlipidemia and kidney stones.  She returns  today for routine follow-up.  She says she is doing quite well.  She  often walks from her house to the [employer], which she says is quite a  ways away, and walks back without any chest pain or shortness of breath.  She does not take frequent rest breaks.  She denies any orthopnea, no  PND.  No lower extremity edema.  No bleeding from her Coumadin.  Current  medications are Coreg 25 b.i.d., aspirin 81 a day, digoxin 0.25 a day,  metformin 1000 a day, Prilosec, Coumadin, potassium 20 t.i.d. Lasix 40  mg every other day, quinapril 40 mg a day.   PHYSICAL EXAM:  She well-appearing no acute distress ambulates the  clinic without respiratory difficulty.  Blood pressure is 113/70 arteries 87, weights 175 which is down 5  pounds.  HEENT is normal.  NECK:  Supple.  No JVD.  Carotid 2+ bilaterally bruits.  There is no  lymphadenopathy or thyromegaly.  CARDIAC:  PMI is laterally displaced is regular rate and rhythm with  mechanical S1.  No murmur.  LUNGS:  Clear.  ABDOMEN:  Soft, nontender, nondistended past and in the no bruits, no  masses.  Good bowel sounds.  EXTREMITIES:  Warm with no cyanosis, clubbing or edema.  No rash.  NEURO:  Alert and x3.  Cranial nerves II-XII are intact.  Moves all four  extremities without difficulty.  Affect is pleasant.   ASSESSMENT/PLAN:  1. Congestive heart failure secondary nonischemic cardiomyopathy.  She      is euvolemic and continues to be NYHA class II.  She is on      excellent medications.  I once again had a long talk with her the      possibility of ICD.  She is still not sure about this.  I have      asked her to discuss this possibility with Dr. Graciela Husbands in the EP and      will set her up with appointment for him.  2. Status post mitral valve replacement stable.  Continue Coumadin.      She will need a follow-up echocardiogram at her next visit.  3. Hypertension.  This is stable.  4. Diabetes, this is followed by Dr. Petra Kuba.  She is on metformin      in the setting of heart failure.  Have to be  very careful about      this.  However, as she is well compensated I do not think this is a      large risk.  We do know her kidney function is stable.  Will need      to with the situation closely.  5. Hyperlipidemia as per Dr. Petra Kuba.   DISPOSITION:  Will see her back in 4 to 6 months with an echocardiogram.     Bevelyn Buckles. Bensimhon, MD  Electronically Signed    DRB/MedQ  DD: 06/12/2007  DT: 06/12/2007  Job #: [id number]   cc:   Deborah [name], M.D.

## 2010-08-24 NOTE — Assessment & Plan Note (Signed)
[facility]                            CARDIOLOGY OFFICE NOTE   MODESTY, Deborah [name]                       MRN:          [id number]  DATE:03/23/2007                            DOB:          02-Nov-1945    PRIMARY CARE PHYSICIAN:  Mina Marble, MD.   INTERVAL HISTORY:  Deborah [name] is a very pleasant, 65 year old woman with  a history of congestive heart failure secondary to nonischemic  cardiomyopathy and an ejection fraction of 25%.  She also has a history  of severe mitral regurgitation and is status post mitral valve  replacement with a St. Jude valve in 2005.  The remainder of her history  is notable for type 2 diabetes, hypertension, hyperlipidemia, and recent  kidney stone.   She returns today for routine followup, she is doing quite well, she  denies any chest pain  or shortness of breath, she says she can do all  the activities she wants to without any difficulties, she has not had  any lower extremity edema.   CURRENT MEDICATIONS:  1. Aspirin 81.  2. Digoxin 0.25 a day.  3. Metformin 1000 mg a day.  4. Warfarin.  5. Quinapril 20 a day.  6. Potassium unknown dose t.i.d.  7. Coreg 25 b.i.d.  8. Lasix 40 a day.   PHYSICAL EXAMINATION:  GENERAL:  She is well-appearing and in no acute  distress and ambulates around the clinic without any respiratory  difficulty.  VITAL SIGNS:  Blood pressure is 130/78, heart rate is 93, weight is 180,  which is stable.  HEENT:  Normal.  NECK:  Supple, there is no JVD, carotids are 2+ bilaterally without any  bruits, there is no lymphadenopathy or thyromegaly.  CARDIAC:  The PMI is laterally displaced, she has a regular rate and  rhythm with a mechanical S1, no obvious murmur.  LUNGS:  Clear.  ABDOMEN:  Soft, nontender, and nondistended, no hepatosplenomegaly, no  bruits, no masses, good bowel sounds.  EXTREMITIES:  Warm with no evidence cyanosis, clubbing, or edema, no  rash.  NEURO:  Alert and  oriented x3, cranial nerves II-XII are intact, moves  all four extremities without difficulty, affect is pleasant.   EKG shows normal sinus rhythm at a rate of 93 with LVH and  repolarization abnormalities, there is no nonspecific intraventricular  conduction delay and biatrial enlargement.   ASSESSMENT AND PLAN:  1. Congestive heart failure secondary to nonischemic cardiomyopathy.      She is euvolemic and New York Heart Association Class II.  We will      increase her Quinapril to 40 mg once a day and check a BMET in a      week.  I had a long talk with her about a possible prophylactic      implantable cardioverter defibrillator (ICD).  She would like to      think about this.  She has asked me to call her son and I will do      this.  2. Mitral valve replacement.  This is stable.  Continue Coumadin.  3.  Hypertension.  Blood pressure is mildly elevated.  We are      increasing her Quinapril.     Deborah [name]. Bensimhon, MD  Electronically Signed    DRB/MedQ  DD: 03/23/2007  DT: 03/25/2007  Job #: [id number]   cc:   Mina Marble, M.D.

## 2010-08-24 NOTE — Consult Note (Signed)
NAMEJERIAH, Deborah [name] NO.:  [id number]   MEDICAL RECORD NO.:  [id number]          PATIENT TYPE:  INP   LOCATION:  5743                         FACILITY:  [facility]   PHYSICIAN:  Gerrit Friends. Dietrich Pates, MD, FACCDATE OF BIRTH:  [birth year]   DATE OF CONSULTATION:  12/12/2006  DATE OF DISCHARGE:                                 CONSULTATION   REFERRING PHYSICIAN:  Dr. Petra Kuba   PRIMARY CARDIOLOGIST:  Dr. Gala Romney   HISTORY OF PRESENT ILLNESS:  A 65 year old woman admitted with right  abdominal and flank pain and found to have a right ureteral stone;  referred for assessment of operative risk and optimization of medical  therapy.  [name] is known to our service since at least 2004 when she  was found to have cardiomyopathy and severe mitral regurgitation.  She  subsequently underwent a valve replacement surgery with a mechanical  device at [facility] in 2005.  She has done quite well since, with  compensated CHF.  Left ventricular systolic function was last assessed  on echocardiography in 2007.  Ejection fraction was 25% at that time.  She has trivial coronary disease, last assessed in 2004.  Anticoagulation is managed from our office, but [name] rarely sees a  cardiologist.  Dr. Corinda Gubler was previously her principal physician, but  she has most recently been seen by Dr. Gala Romney prior to orthopedic  surgery earlier this year.   PAST MEDICAL HISTORY:  Is notable for hypertension, diabetes, obesity  and hyperlipidemia.  She underwent ORIF for right ankle fracture in  March 2008.   She has no known allergies.   RECENT MEDICATIONS:  Include:  1. Aspirin 81 mg daily.  2. Digoxin 0.25 mg daily.  3. Amaryl 2 mg daily.  4. Lisinopril 20 mg daily.  5. Protonix 40 mg daily.  6. Coreg 3.125 mg b.i.d.  7. Warfarin.   Due to the systolic blood pressure on the low side in hospital,  carvedilol and lisinopril have been held.   SOCIAL HISTORY:  Lives locally;  disabled; divorced with no children; 40  pack-year history of tobacco use in the past.  No excessive use of  alcohol.   FAMILY HISTORY:  Positive for coronary disease in her mother who died at  age 42 and in her father died at age 48.  One of her sisters suffered a  fatal coronary event in her 46s.   REVIEW OF SYSTEMS:  Is notable for intermittent arthralgias in the right  upper ankle, dysuria.  All other systems reviewed and are negative.   EXAMINATION:  Matter-of-fact woman with a flat affect in no acute  distress.  The temperature is 98.6, heart rate 88 and regular,  respirations 18, blood pressure 120/80, O2 saturation 100% on room air.  HEENT:  Anicteric sclerae; EOMs full; normal oral mucosa.  NECK:  No jugular venous distention; normal carotid upstrokes without  bruits.  ENDOCRINE:  No thyromegaly.  HEMATOPOIETIC:  No adenopathy.  SKIN:  No significant lesions.  PSYCHIATRIC:  Alert and oriented; normal affect.  LUNGS:  Clear.  CARDIAC:  Prosthetic  first and second heart sounds; slight right  ventricular lift.  ABDOMEN:  Soft and nontender; no organomegaly; normal bowel sounds.  EXTREMITIES:  Show 1+ ankle edema.   LABORATORY:  Notable for a hemoglobin of 8.7 and hematocrit of 25.9 with  a normal MCV.  Creatinine is elevated at 1.6 with a normal BUN.  Lipid  profile is good with total cholesterol of 129, HDL of 42 and LDL of 71.  Triglycerides are 79.  INR was 2.2 on admission, but is now normal since  warfarin has been held and Lovenox administered at full dose.  Glucose  values have been elevated in the range of 104 - 183.   EKG:  Sinus tachycardia; LVH with repolarization abnormality; markedly  delayed R-wave progression; intra-atrial conduction delay; left anterior  fascicular block.   Chest x-ray:  No acute abnormality.   IMPRESSION:  [name] is stable from cardiovascular standpoint.  She  has done remarkably well with somewhat spotty followup cardiology care   and mitral valve replacement therapy in the setting of severe left  ventricular dysfunction.  Her usual medical therapy should be continued.  She requires upward titration of beta blocker as tolerated.  Bridging  anticoagulation with Lovenox is entirely appropriate.   Reviewing prior laboratory data reveals that anemia may be chronic.  Hemoglobin was 11.4 with an MCV of 90 when the patient was admitted in  March 2008.  Hemoglobin and hematocrit were normal in July 2007.  Initial evaluation will include iron, iron binding and stool for  hemoccult testing.      Gerrit Friends. Dietrich Pates, MD, [facility]  Electronically Signed     RMR/MEDQ  D:  12/12/2006  T:  12/12/2006  Job:  [id number]

## 2010-08-24 NOTE — Assessment & Plan Note (Signed)
[facility]                            CARDIOLOGY OFFICE NOTE   Deborah [name], Deborah [name]                       MRN:          [id number]  DATE:01/04/2007                            DOB:          February 28, 1946    PRIMARY CARE PHYSICIAN:  Deborah [name], M.D.   INTERVAL HISTORY:  Deborah [name] is a 65 year old woman with a history of  severe mitral regurgitation, who is status post attempted mitral valve  repair and subsequent mitral valve replacement with a Saint Jude  prosthesis by Deborah [name], August of 2005.  She also has severe LV  dysfunction, ejection fraction of 25%.  The remainder of her medical  history is hyperlipidemia, diabetes and obesity.  She has previously  been followed by Deborah [name] and presents today to establish long-term  care.   She was recently in the hospital with a kidney stone and was about to  undergo lithotripsy, but was able to pass the stone by herself,  fortunately.   She comes today for followup.  While she was in the hospital, she had an  echocardiogram, which showed an ejection fraction of 25%.  The ventricle  was moderately dilated with an end diastolic dimension of 61 mm.  The  mitral valve prosthesis looked good, no obvious leak.  There was mild  tricuspid regurgitation.  She says she feels fine.  She denies any chest  pain or shortness of breath, no lower extremity edema.   CURRENT MEDICATIONS:  1. Aspirin 81.  2. Digoxin 0.25 a day.  3. Metformin 1000 a day.  4. Lisinopril 20 mg every day except Thursday.  5. Prilosec 20 mg a day.  6. Coreg 3.125 b.i.d.  7. Coumadin.   PHYSICAL EXAM:  She is in no acute distress.  She ambulates around the  clinic without any respiratory difficulty.  HEENT:  Normal.  NECK:  Supple.  There is no JVD.  Carotids are 2+ bilaterally without  any bruits.  There is no lymphadenopathy or thyromegaly.  CARDIAC:  The PMI is laterally displaced.  She has a regular rate and  rhythm  with a mechanical S1, no obvious murmur.  LUNGS:  Clear.  ABDOMEN:  Soft, nontender, nondistended, no hepatosplenomegaly, no  bruits, no masses, good bowel sounds.  EXTREMITIES:  Warm with no cyanosis, clubbing or edema, no rash.  NEUROLOGIC:  She is alert and oriented times three.  Cranial nerves II  through XII are intact.  Moves all four extremities without difficulty.  Affect is flattened.   Her EKG shows normal sinus rhythm at a rate of 95 with LVH and QRS  prolongation at 132 milliseconds.   ASSESSMENT AND PLAN:  1. Congestive heart failure, secondary to non-ischemic cardiomyopathy.      Her ejection fraction has been relatively stable.  She is      functionally NYHA Class II.  She does not appear to have any volume      overload.  Long discussion with her about the possible need for a      defibrillator, but she does not seem much  interested at this point.      For now, we will work hard on trying to titrate up her medications,      especially her beta blocker.  I have told her to take lisinopril      every day of the week.  I will refer her to Deborah [name] in the      heart failure clinic for continued titration of her medications.      We will repeat an echocardiogram probably in about four to six      months.  If her LV function remains depressed, I do think we need      to refer her to EP for evaluation for possible defibrillator.  2. Mitral regurgitation, status post mitral valve replacement.  This      is stable.  She is on Coumadin.  Continue current therapy.   DISPOSITION:  As above.     Deborah Buckles. Bensimhon, MD  Electronically Signed    DRB/MedQ  DD: 01/04/2007  DT: 01/05/2007  Job #: [id number]   cc:   Deborah [name], M.D.

## 2010-08-24 NOTE — Consult Note (Signed)
NAMEANGENI, CHAUDHURI NO.:  [id number]   MEDICAL RECORD NO.:  [id number]          PATIENT TYPE:  INP   LOCATION:  5743                         FACILITY:  [facility]   PHYSICIAN:  Maretta Bees. Vonita Moss, M.D.DATE OF BIRTH:  Mar 20, 1946   DATE OF CONSULTATION:  DATE OF DISCHARGE:                                 CONSULTATION   Room number is 57-43.   PAST SURGICAL HISTORY:  I was asked to see this 65 year old lady who was  admitted with abdominal pain and nausea and vomiting.  She is found to  have a  4 x 7 mm stone in the right ureter at the level of L5 on CT scan.  She  has never had this done before, she might have had some gross hematuria.  She had low grade fever at best.  She has chronic frequency of  urination.  She has had a past history of UTI's.   Her serum creatinine was 1.33 and a urinalysis showed 3 to 6 white  cells, rare bacteria and TNCC red cells.  She cannot report of urine  culture being done, but she is already on IV Rocephin.   Her problems are complicated by cardiac problems and she is on chronic  Coumadin therapy which has been put on hold pending the clinical course.   PAST MEDICAL HISTORY:  She has a past history of mitral valve  replacement and 2005.  She has a long history of type 2 diabetes.  Other  medical problems include atrial fibrillation, history of congestive  heart failure, hyperlipidemia, hypertension, and history of myocardial  infarction.   MEDICATIONS:  Medications include metformin, Accupril, Coumadin,  lisinopril, Lipitor, Lanoxin, Coreg, Temazepam, Klor-Con, Famotidine,  metoclopramide. Apparently, she has recently been on Ceftin.   SOCIAL HISTORY:  She denies alcohol or tobacco utilization.   She denies headache, dizziness with chest pain at present time.  She has  some chronic frequency of urination.  She denies incontinence.  She  denies ankle swelling at the present time.   PHYSICAL EXAMINATION:  Her vital signs are  stable.  She had maximum  temperature of 99.3 degrees.  On admission she had temperature 100  degrees, but it has been under that since then.  She is alert and  oriented.  No acute distress.  NECK:  Supple.  No respiratory distress.  ABDOMEN:  Soft, nontender.   IMPRESSION:  1. Right ureteral calculus.  2. History UTIs.  3. Question of UTIs present time for anticoagulation therapy.   PLAN:  At this point she has orders for IV Dilaudid and Zofran p.r.n.  nausea, vomiting and pain and she is on IV Rocephin.  Her Coumadin is on  hold and PT will be ordered for the morning.  Also get a KUB to see that  the stone looks like she was moved down any lower.   The stone is borderline and size regards passage and pending clinical  course she may need Flomax to pass the stone if moves down lower or she  may end up needing an intervention the form of ureteroscopy  or  lithotripsy.   History number [id number].      Maretta Bees. Vonita Moss, M.D.  Electronically Signed     LJP/MEDQ  D:  12/08/2006  T:  12/10/2006  Job:  [id number]   cc:   Mina Marble, M.D.

## 2010-08-24 NOTE — Assessment & Plan Note (Signed)
[facility]                            CARDIOLOGY OFFICE NOTE   Deborah [name], Deborah [name]                       MRN:          [id number]  DATE:12/19/2007                            DOB:          [birth year]    PRIMARY CARE PHYSICIAN:  Mina Marble, MD   INTERVAL HISTORY:  Ms. Mertens is a very pleasant 65 year old woman with a  history of congestive heart failure, secondary to nonischemic  cardiomyopathy, ejection fraction around 25%.  She also has a history of  mitral valve disease and status post mitral valve replacement with a St.  Jude valve in 2005.  Remainder of her history is notable for diabetes,  hypertension, hyperlipidemia, and kidney stones.  She recently underwent  ICD placement by Dr. Graciela Husbands.   She returns today for routine followup.  She says she is feeling great.  She is walking at least a mile every day without any problem.  Has not  had any problems with orthopnea and no PND, no lower extremity edema, no  chest pain.  She has not had any palpitations or ICD firing.  She is  pleased with herself that she has lost few pounds.  Remainder of review  of systems is negative.   Current medications are aspirin 81, digoxin 0.25 a day, metformin 1000 a  day, omeprazole 20 mg a day, warfarin, potassium 20 mEq t.i.d., Lasix 40  mg every other day, quinapril 40 a day, carvedilol 25 b.i.d.   PHYSICAL EXAMINATION:  GENERAL:  She is well appearing in no acute  distress, ambulatory around the clinic without any respiratory  difficulty.  VITAL SIGNS:  Blood pressure is 124/72, heart rate 69, weight is 169.  HEENT:  Normal.  NECK:  Supple.  There is no JVD.  Carotids are 2+ bilaterally without  bruits.  There is no lymphadenopathy or thyromegaly.  CARDIAC:  PMI is laterally displaced.  She has a regular rate and rhythm  with mechanical S1.  No murmur.  LUNGS:  Clear.  ABDOMEN:  Soft, nontender, nondistended.  No hepatosplenomegaly, no  bruits, no  masses.  EXTREMITIES:  Warm with no cyanosis, clubbing, or edema.  No rash.  NEUROLOGIC:  Alert and oriented x3.  Cranial nerves II through XII are  intact.  Moves all 4 extremities without difficulty.  Affect is  pleasant.   ASSESSMENT AND PLAN:  1. Congestive heart failure secondary to nonischemic cardiomyopathy.      Ms. Gunnarson has had an echocardiogram today.  EF is probably in the      30-35% range.  She is euvolemic and is on good medicines.  She is      NYHA class II.  2. Status post mitral valve replacement.  Her valve looks good on      recent echo.  3. Hypertension.  This is stable.  4. Hyperlipidemia as per Dr. Petra Kuba.  Given her diabetes goal, LDL      is less than 70.  5. Digoxin dose.  Suspect it would be better for her to be on 0.125 a  day.  We will call her and we will decrease her dose.      Bevelyn Buckles. Bensimhon, MD  Electronically Signed    DRB/MedQ  DD: 12/19/2007  DT: 12/20/2007  Job #: [id number]   cc:   Mina Marble, M.D.

## 2010-08-24 NOTE — Op Note (Signed)
Deborah [name], ROTZ NO.:  [id number]   MEDICAL RECORD NO.:  [id number]          PATIENT TYPE:  INP   LOCATION:  2807                         FACILITY:  [facility]   PHYSICIAN:  Doylene Canning. Ladona Ridgel, MD    DATE OF BIRTH:  1945-05-27   DATE OF PROCEDURE:  07/20/2007  DATE OF DISCHARGE:                               OPERATIVE REPORT   PROCEDURE PERFORMED:  Implantation of a single chamber defibrillator.   INDICATIONS:  Nonischemic cardiomyopathy with congestive heart failure,  EF 25%.   The patient is a 65 year old woman with longstanding nonischemic  cardiomyopathy with mitral valve disease status post mitral replacement  who was admitted to the hospital for ICD implantation.   PROCEDURE:  After informed consent was obtained, the patient was taken  to the diagnostic EP lab in a fasting state.  After usual preparation  and draping, intravenous fentanyl and midazolam was given for sedation.  A 30 mL of lidocaine was infiltrated in the left infraclavicular region.  A 7 cm incision was carried out over this region and electrocautery  utilized to dissect down to the fascial plane.  The left subclavian vein  was subsequently punctured and the St. Jude Durata, model 7120, 60-cm  active-fixation defibrillation lead with serial number [id number] was  advanced to the right ventricle.  At the final site on the RV septum,  the R-waves were 22 mV, the pacing thresholds 0.6 volts at 0.5  milliseconds, and the impedance 693 ohms at 10 volts pacing did not see  with the diaphragm.  With the defibrillation lead in satisfactory  position, it was secured to subpectoralis fascia with a figure-of-eight  silk suture.  Sewing sleeve was all secured with silk suture.  Electrocautery was utilized to make subcutaneous pocket.  Kanamycin  irrigation was utilized to irrigate the pocket.  The St. Jude Current VR  RF single chamber defibrillator, serial number [id number] was connected to  the  defibrillation lead and placed back in the subcutaneous pocket.  Generator secured with silk suture.  Defibrillation threshold testing  was carried out.   After the patient was more deeply sedated with fentanyl and Versed, VF  was induced with T-wave shock.  A 15 joules shock was delivered  terminating VF and restoring sinus rhythm.  At this point, no additional  defibrillation threshold testing was carried out and the incision was  closed with layer of 2-0 Vicryl followed by layer of 3-0 Vicryl.  Benzoin was painted on the skin, Steri-Strips were applied, a pressure  dressing placed, and the patient was returned to her room in  satisfactory condition.   COMPLICATIONS:  There were no immediate procedure complications.   RESULTS:  Demonstrate successful implantation of a St. Jude single  chamber defibrillator in a patient with nonischemic cardiomyopathy,  congestive heart failure status post mitral valve replacement.     Doylene Canning. Ladona Ridgel, MD  Electronically Signed    GWT/MEDQ  D:  07/20/2007  T:  07/21/2007  Job:  [id number]   cc:   Bevelyn Buckles. Bensimhon, MD

## 2010-08-24 NOTE — Assessment & Plan Note (Signed)
[facility]                          CHRONIC HEART FAILURE NOTE   TIARRA, ANASTACIO                       MRN:          [id number]  DATE:03/01/2007                            DOB:          07/05/1945    PRIMARY CARE PHYSICIAN:  Mina Marble, M.D.   PRIMARY CARDIOLOGIST:  Bevelyn Buckles. Bensimhon, M.D.   Ms. Burack returned today for followup of her congestive heart failure  which is secondary to nonischemic cardiomyopathy. Ms. Hoar just  recently established care with our office and was seen by Dr. Gala Romney.  She has a known ejection fraction of 25% in the setting of severe mitral  regurgitation status post attempted mitral valve repair and subsequent  mitral valve replacement with a Saint Jude prosthesis by Dr. Gasper Lloyd  in 2005. She also has a history of poorly controlled hypertension. Ms.  Salas is doing well since I last saw her in October. At that visit, I  increased her Coreg to 12.5 mg b.i.d. She returns today stating that she  has not had any problems with this and denies any pre-syncope or syncope  or lightheadedness, dizziness, orthopnea or PND. She continues to live  alone, does all of her own housekeeping and cooking. She tries to walk  3/4 to one mile daily. She has some chronic lower extremity edema in the  right ankle secondary to a surgery. She is also diabetic and states that  her sugars continue to run between 100 and 110. Overall, has done quite  well.   PAST MEDICAL HISTORY:  1. Nonischemic cardiomyopathy with ejection fraction of 25% by      echocardiogram.  2. Status post cardiac catheterization showing non-obstructive disease      in 2004.  3. History of severe mitral regurgitation status post attempted mitral      valve repair, ultimately a mitral replacement with a Saint Jude      device at [facility] in 2005.  4. Chronic anticoagulation therapy managed by primary care.  5. Remote history of tobacco use.  6.  Hyperlipidemia.  7. Asthma.  8. Hypertension, poorly controlled in the past.  9. Type 2 diabetes.  10.Questionable old myocardial infarction.  11.History of bilateral carpal tunnel repair.  12.History of C-section.  13.Status post total abdominal hysterectomy with ovaries intact.  14.Status post appendectomy.  15.Recent hospitalization for kidney stone and urinary tract      infection.   REVIEW OF SYSTEMS:  As stated above.   CURRENT MEDICATIONS:  1. Aspirin 81.  2. Digoxin 0.25.  3. Metformin 1000 mg.  4. Prilosec p.r.n.  5. Warfarin as directed by Dr. Petra Kuba.  6. Furosemide 40 mg daily.  7. Carvedilol 12.5 b.i.d.  8. Potassium over-the-counter t.i.d.  9. Quinapril 20 mg daily.   PHYSICAL EXAMINATION:  Weight 177 pounds. Blood pressure 120/76, heart  rate of 72. Ms. Nghiem is in no acute distress.  No signs of jugular vein distention at a 45 degree angle.  CARDIOVASCULAR: Reveals an S1, S2 with a crisp mechanical S1.  LUNGS:  Clear to auscultation.  ABDOMEN: Soft and  nontender. Positive bowel sounds.  LOWER EXTREMITIES: Without clubbing, cyanosis. She has just a trace of  edema, non-pitting to the right lower extremity.  NEUROLOGIC: Alert and oriented x3. Flat affect.   IMPRESSION:  Congestive heart failure secondary to nonischemic  cardiomyopathy. Will increase carvedilol to 18.375 mg b.i.d. The patient  will finish up the carvedilol that she has at this dose and then I have  given her a new prescription for carvedilol 25 mg b.i.d. She will start  this in approximately 2-3 weeks and I will see her back in a month for  further evaluation. Will plan on repeating her 2D echocardiogram soon.  Also, will check lab work today including her potassium level as I have  asked her again to bring her medications with her at her next  appointment for clarification of her over-the-counter potassium  supplement.      Dorian Pod, ACNP  Electronically Signed      Rollene Rotunda, MD, [facility]  Electronically Signed   MB/MedQ  DD: 03/01/2007  DT: 03/01/2007  Job #: [id number]   cc:   Mina Marble, M.D.

## 2010-08-24 NOTE — Assessment & Plan Note (Signed)
[facility]                         ELECTROPHYSIOLOGY OFFICE NOTE   Deborah [name], Deborah [name]                       MRN:          [id number]  DATE:03/10/2008                            DOB:          1946/04/08    Deborah [name] comes in followup for an ICD implanted at last spring for  primary prevention in the setting of nonischemic cardiomyopathy.  She  continues to do well.  She has no complaints of chest pain.  Her  shortness of her breath is stable.   She is wanting to go on a trip.  I encouraged her to do that.  Her Other  complaint is nausea which she describes to taking antibiotics.  She had  been told somewhere down at [facility] that following her mitral valve  replacement that she would always have to be on antibiotics if she got a  cold.   Her medications in addition to the amoxicillin are unchanged.  She is  taking carvedilol 25 b.i.d., quinapril 40, furosemide 40, potassium 10  t.i.d., omeprazole 20, warfarin, digoxin 0.125, and metformin 1000, as  well as aspirin.   Her blood pressure today was 122/79 with a pulse of 95.  Her weight was  177, which is up 8 pounds since December.  Her neck veins were flat.  Her lungs were clear.  Her heart sounds were regular with a mechanical  S1 and early systolic murmur.  The abdomen was soft.  The extremities  had no edema.   Interrogation of her St. Jude ICD demonstrates an R-wave of 11.4, the  impedance of 540, the threshold of 0.5 at 0.5.  battery voltage of 3.2.   IMPRESSION:  1. Nonischemic cardiomyopathy.  2. Status post mitral valve repair.  3. Status post implantable cardioverter-defibrillator for primary      prevention.  4. Intercurrent bronchitis on antibiotics.  5. Nausea, questionably related to intercurrent bronchitis.   I suggested that she stop her amoxicillin at this point, and when she  sees Dr. Gala Romney, I have asked her to clarify with him what is the  appropriate role of antibiotics  for her with her mitral valve  replacement.   We will see her again in 6 months' time, which will be followed remotely  in the interim.     Duke Salvia, MD, [facility]  Electronically Signed    SCK/MedQ  DD: 03/18/2008  DT: 03/19/2008  Job #: [id number]

## 2010-08-24 NOTE — Assessment & Plan Note (Signed)
[facility]                          CHRONIC HEART FAILURE NOTE   NAME:Deborah [name]                       MRN:          [id number]  DATE:02/02/2007                            DOB:          January 05, 1946    PRIMARY CARE PHYSICIAN:  Theora Gianotti, M.D.   [name] is a very pleasant, 65 year old, African-American female, who  presents today to the Heart Failure Clinic as a new patient.  Deborah [name] has a  history of poorly controlled hypertension and mitral valve replacement  with a St. Jude prosthesis by Dr. Gasper Lloyd at [facility] in 2005.  Deborah [name] has nonischemic cardiomyopathy most likely secondary to her  severe mitral regurgitation and hypertension.  Deborah [name] has an ejection  fraction of 25% by echocardiogram.  Deborah [name] has had an echocardiogram  repeated earlier this fall.  Deborah [name] also has a history of mild coronary  artery disease, and left cardiac catheterization was in May of 2004.  There is a questionable history of an old infarct; do not have any  documentation to support that at this time.  Deborah [name] lives here in  [place].  Deborah [name] lives alone in an apartment.  Deborah [name] is not employed  outside the home.  Deborah [name] states Deborah [name] does all of her own housekeeping and  cooking herself.  Deborah [name] tries to walk daily approximately three-fourths to  one mile without shortness of breath.  Deborah [name] denies any orthopnea or chest  discomfort.  Deborah [name] states Deborah [name] does have mild swelling in her right ankle,  but this is status post the surgical repair of that area by Dr. Allie Bossier in the past.  Deborah [name] is also a diabetic.  Deborah [name] states her  sugars run between 80 and 103.  Deborah [name] is compliant with her sliding scale  insulin coverage if her CBG is greater than 120, but states Deborah [name] has not  had to take any insulin in a long time.  Deborah [name] states Deborah [name] used to be  morbidly obese, weighing over 200 pounds, but has lost a significant  amount of weight.   PAST MEDICAL HISTORY:  1.  Nonischemic cardiomyopathy with an EF currently 25% by      echocardiogram.  2. Status post cardiac catheterization showing nonobstructive disease      in 2004.  3. History of severe mitral valve regurgitation status post mitral      valve replacement with a St. Jude device at [facility] in 2005.  4. Chronic anticoagulation therapy and managed by Dr. Petra Kuba.  5. Remote history of tobacco use.  6. Hyperlipidemia.  7. Asthma.  8. Hypertension.  9. Kidney stones.  10.Type 2 diabetes.  11.Questionable old myocardial infarction.  12.History of bilateral carpal tunnel repair.  13.History of C-section, remote past.  14.Status post TAH with ovaries intact.  15.Status post appendectomy.   REASON FOR ADMISSION:  As stated above.   CURRENT MEDICATIONS:  1. Aspirin 81.  2. Digoxin 0.25.  3. Metformin 1000.  4. Lisinopril 20 mg daily.  5. Prilosec p.r.n.  6. Coreg 6.25 b.i.d.  7. Potassium supplement, dose is  unclear.  Patient states Deborah [name] takes it      three times a day.  8. Furosemide 40 mg b.i.d.  9. Warfarin per Dr. Consuello Bossier instructions.   PHYSICAL EXAMINATION:  VITAL SIGNS:  Weight 179 pounds, weight is stable  from one month ago, blood pressure 110/68 with a heart rate of 86.  GENERAL:  Deborah [name] is alert and oriented in acute distress.  NECK:  Deborah [name] has no signs of jugular vein distention at a 45-degree angle.  CARDIOVASCULAR:  An S1 and S2, regular rate and rhythm with a crisp  mechanical valve closure, did not hear any gallops or murmurs or rubs.  LUNGS:  Clear to auscultation bilaterally.  ABDOMEN:  Soft and nontender, positive bowel sounds.  LOWER EXTREMITIES:  Without clubbing, cyanosis, or edema.  SKIN:  Warm and dry.  NEURO:  Affect is flattened.   IMPRESSION:  Congestive heart failure secondary to nonischemic  cardiomyopathy without signs of volume overload at this time.  Ejection  fraction has remained stable for the last two years.  The patient is   functionally New York Heart Association Class II.  I am going to be  increasing her Carvedilol to 12.5 mg b.i.d.  I have asked Deborah [name] to  increase her evening dose to two tablets of the 6.25 mg over the next  four days, continue one tablet in the morning and then, if Deborah [name] does not  experience any lightheadedness or dizziness, Deborah [name] is to increase the  morning dose to 12.5, also.  I have given her a new prescription for  12.5 mg tablets to take one p.o. b.i.d.  Deborah [name] is to continue her other  previous medications, and reviewing the most recent lab work done  September the 5th, a potassium was 3.5, BUN and creatinine 6 and 0.94,  H&H 8.8 and 26.2.  These are lab work from the [facility].  I  will plan on repeating the lab work when I see Deborah [name] back in  November.     Dorian Pod, ACNP  Electronically Signed      Rollene Rotunda, MD, [facility]  Electronically Signed   MB/MedQ  DD: 02/02/2007  DT: 02/03/2007  Job #: [id number]   cc:   Mina Marble, M.D.

## 2010-08-24 NOTE — Discharge Summary (Signed)
Deborah [name], VINT NO.:  [id number]   MEDICAL RECORD NO.:  [id number]          PATIENT TYPE:  INP   LOCATION:  3705                         FACILITY:  [facility]   PHYSICIAN:  Luis Abed, MD, FACCDATE OF BIRTH:  1945/05/11   DATE OF ADMISSION:  07/20/2007  DATE OF DISCHARGE:  07/21/2007                               DISCHARGE SUMMARY   PRIMARY CARDIOLOGIST:  Dr. Lewayne Bunting.   PRIMARY CARE PHYSICIAN:  Dr. Corine Shelter.   PROCEDURES PERFORMED DURING HOSPITALIZATION:  1. Implantation of St. St. Jude Medical current VRRF.  A: Serial number [id number].   FINAL DISCHARGE DIAGNOSES:  1. Nonischemic cardiomyopathy.  A:  New York Heart Association class II.  1. Status post implantation St. Jude Medical single chamber ICD      pacemaker.  2. Diabetes.  3. Hypertension.  4. Status post mitral valve replacement.   HOSPITAL COURSE:  This is a 65 year old female patient of Dr. Sherryl Manges who was admitted for implantation of ICD pacemaker secondary to  nonischemic cardiomyopathy with class II symptoms.  The patient was seen  by Dr. Graciela Husbands in the office on July 05, 2007 with planned admission for  ICD placement.  The patient did have ICD placement completed per Dr.  Lewayne Bunting on July 20, 2007.  She tolerated the procedure well  without any incidents of infection, bleeding, signs of hematoma.  The  patient did have a follow-up check by [facility]. Jude representative and all  values found to be normal.  There was no episodes of firing and the  patient continued to recover well.  On day of discharge the patient was  seen and examined by Dr. Myrtis Ser, the patient was stable.  The patient's  ICD site was sore but there was other problems.  The patient was anxious  to go home.  Coumadin was restarted on day of discharge with a dose of  15 mg as taken at home and will be continued as taken` at home at 10 mg  a day.   Vital Signs: On discharge, blood pressure 110/59, heart  rate 76,  respirations 18, temperature 98.4 with an O2 sat of 94% on room air.   LABS ON DISCHARGE:  PT 14.7, INR 1.1, sodium 140, potassium 4.5,  chloride 103, CO2 31, glucose 170, BUN 13, creatinine 0.9.   DISCHARGE MEDICATIONS:  1. Coreg 25 mg twice a day.  2. Aspirin 81 mg daily.  3. Digoxin 0.25 mg daily.  4. Potassium 20 mEq 3 times a day.  5. Lasix 40 mg every other day.  6. Glucophage 1000 mg daily.  7. Lisinopril 40 mg daily.  8. Coumadin 15 mg on Monday and 10 mg daily.  The rest of the week per      Coumadin clinic.   ALLERGIES:  No known drug allergies.   FOLLOW-UP PLANS AND APPOINTMENT:  1. The patient is to follow up in the Pacemaker Clinic in 1 month.      Our office will call to make appointment as this is weekend.  2. The patient will follow up  with Dr. Sherryl Manges or Dr. Lewayne Bunting in 3 months for reevaluation and continued management.  3. The patient is to follow up with Dr. Arvilla Meres, primary      cardiologist, when released by Dr. Ladona Ridgel.  4. The patient is to follow with PT/INR to manage Coumadin dosages.  5. The patient has been given post pacemaker implantation instructions      with particular emphasis on the pacemaker site for evidence of      bleeding, signs of infection, hematoma, or severe pain.   Time spent with the patient to include physician time 40 minutes.      Bettey Mare. Lyman Bishop, NP      Luis Abed, MD, [facility]  Electronically Signed    KML/MEDQ  D:  07/21/2007  T:  07/21/2007  Job:  [id number]   cc:   Mina Marble, M.D.

## 2010-08-24 NOTE — Discharge Summary (Signed)
NAMELOZA, PRELL NO.:  [id number]   MEDICAL RECORD NO.:  [id number]          PATIENT TYPE:  INP   LOCATION:  5743                         FACILITY:  [facility]   PHYSICIAN:  Mina Marble, M.D.DATE OF BIRTH:  1946-03-23   DATE OF ADMISSION:  12/08/2006  DATE OF DISCHARGE:  12/17/2006                               DISCHARGE SUMMARY   DISCHARGE DIAGNOSES:  1. Urinary tract infection treated, improved.  2. Renal calculi in the right ureter.  3. Primary cardiomyopathy.  4. Chronic systolic heart failure, controlled.  5. Nausea with vomiting consistent with acute gastritis.  6. Valvular heart disease with mitral valve replacement.  7. Diabetes mellitus type 2, improved, on treatment.  8. Old myocardial infarction history.  9. Hyperlipidemia.  10.Long-term anticoagulant therapy on warfarin, Coumadin.  11.Congestive heart failure controlled.  12.Hydronephrosis on the right related to renal calculi that passed      spontaneously.  13.Hypertensive heart disease with congestive heart failure,      controlled.  14.Renal insufficiency, mild, probably related to hydronephrosis and      renal stone that passed.   BRIEF HISTORY:  The patient is a 65 year old, African-American female  with a known history of mitral valve heart disease with mitral valve  replacement several years ago and a known history of diabetes mellitus  type 2 with mild hypertensive heart disease who presented to the [facility] emergency department with persistent uncontrolled  nausea and vomiting and changes of urinary tract infection and was  admitted for further care. Abdominal CT scan and renal CT scan revealed  changes of a renal calculus in the right ureter.  There was some mild to  moderate hydronephrosis.   PAST MEDICAL HISTORY:  The patient has a past history of having mitral  valve heart disease with replacement in August 2005.  She also takes  medications for  diabetes, hypertension, and to help control congestive  heart failure.  She takes metformin as one of her diabetes medications  which was placed on hold before her abdominal CT scan with contrast.   SOCIAL HISTORY:  Noncontributory.  The patient denies smoking.   FAMILY HISTORY:  Noncontributory.   REVIEW OF SYSTEMS:  Review of systems reveals the head, eyes, ears, nose  and throat, cardiopulmonary, GI, and neuromuscular symptoms to be as  noted when the patient presented to the [facility]  emergency department with uncontrolled nausea and vomiting.   ALLERGIES:  The patient has no known drug allergies.   PERTINENT PHYSICAL FINDINGS:  Pertinent physical findings on December 08, 2006 revealed the patient to have a temperature of 99.3, pulse 123 and  regular, respirations 20 and a blood pressure of 156/98 that improved to  113/73 after admission.  HEENT:  Revealed the patient to be alert and coherent. Pupils were  equal, round and reactive to light.  NECK:  Supple with no masses or thyroid enlargement.  LUNGS:  Clear to auscultation and percussion.  HEART:  Revealed a regular rhythm with a faint systolic ejection murmur  at the base.  ABDOMEN:  Soft with mild mid abdominal tenderness but no masses were  felt.  The patient had mild abdominal obesity.  The extremities revealed  good strength and no edema.   HOSPITAL COURSE:  The patient's hospital course was one of gradual  improvement.  She received Rocephin IV for treatment of her urinary  tract infection.  She also received IV fluids for hydration and to help  flush the renal calculi.  The patient was swallowed by KUB and the plan  was to do a  lithotripsy crushing of the stone.  The patient was  transferred to the lithotripsy department at [facility] but on arrival the KUB was unable to find the renal stone which  was considered to have passed spontaneously.  The patient was on Lovenox  as  anticoagulant waiting for the lithotripsy procedure.  After the  lithotripsy procedure was canceled, the Lovenox was resumed as well as  her Coumadin therapy. The patient was anxious to go home even though her  anticoagulant therapy was not in the therapeutic range but had started  to show some response on December 17, 2006 to the Coumadin or warfarin  therapy.  The patient also was placed on Flomax during her  hospitalization to help with relaxing her ureter spasms.  This was also  continued as an outpatient.  The patient's Rocephin was discontinued and  she was continued on Ceftin dose of 500 mg until she was discharged on  December 17, 2006.   DISCHARGE MEDICATIONS:  1. Metformin 500 mg tabs p.o. daily.  2. Quinapril 20 mg p.o. daily.  3. Coumadin 12.5 mg p.o. daily at 6:00 p.m.  4. Digoxin 250 mcg p.o. daily.  5. Aspirin 81 mg p.o. daily.  6. Carvedilol 3.125 mg p.o. b.i.d.  7. Flomax 0.4 mg tabs p.o. daily.   The patient was to have a follow-up in my office in October 2008.  She  was to follow-up with the urologist on January 04, 2007. She was to  follow-up in the [facility] Coumadin Clinic on December 19, 2006 for  further adjustment of her Coumadin or warfarin therapy.  The patient was  to continue on a no added salt modified carbohydrate diet.  She is to  use Tylenol 650 q.6 h p.r.n. for mild pain or headache, she is to  continue her Amaryl 2 mg daily with breakfast. She is to continue  Protonix 40 mg daily as needed for acid reflux. If her plan does not  cover the Quinapril, it will be substituted with Lisinopril 20 mg daily.      Mina Marble, M.D.  Electronically Signed     GRK/MEDQ  D:  02/01/2007  T:  02/02/2007  Job:  [id number]

## 2010-08-24 NOTE — Letter (Signed)
July 05, 2007    Deborah Buckles. Bensimhon, MD  [address], [place] [address]   RE:  Deborah [name], Deborah [name]  MRN:  [id number]  /  DOB:  [birth year]   Dear Deborah [name],   It was a pleasure to see Deborah [name] in concert with her sister today  for consideration of ICD implantation.   As you know, she is a 65 year old woman with a longstanding diagnosis of  nonischemic cardiomyopathy in the setting of severe mitral valve  regurgitation and subsequent attempted mitral valve repair with  unfortunate failure and mitral valve replacements with a St. Jude  prosthesis done in 2005.  She has functionally done very very well.  She  can walk a couple of miles without difficulty.  She has no problems with  significant shortness of breath, peripheral edema, nocturnal dyspnea.  She has had no syncope or significant palpitations.   PAST MEDICAL HISTORY:  In addition to above is notable for:  1. Diabetes.  2. Chronic anticoagulation.  3. Dyslipidemia.  4. Asthma.   PAST SURGICAL HISTORY:  Notable for carpal tunnel, C-section,  hysterectomy, appendectomy.   REVIEW OF SYSTEMS:  Notable for nephrolithiasis.   MEDICATIONS:  1. Aspirin 81.  2. Digoxin 0.25.  3. Metformin 1000.  4. Omeprazole.  5. Warfarin.  6. Potassium.  7. Furosemide.  8. Quinapril 40.  9. Carvedilol 25 b.i.d.   She has no known drug allergies.   SOCIAL HISTORY:  She lives by herself.  She has one child and no  grandchildren.  She does not use cigarettes, alcohol or recreational  drugs.   Review of systems is broadly negative as above except for an ankle  fracture which has been associated with chronic pain since 2008.   PHYSICAL EXAMINATION:  Her blood pressure was 121/79, her pulse was 78,  her weight was 179.  She was in no acute distress.  She is an age-appropriately-appearing, 65-  year-old, African-American woman.  HEENT:  No icterus or xanthoma.  NECK:  Neck veins were flat.  Carotids were brisk and full  bilaterally  without bruits.  BACK:  Without kyphosis, scoliosis.  LUNGS:  Clear.  HEART:  Sounds were regular with a mechanical S1.  There is an early  systolic murmur.  The PMI was displaced.  ABDOMEN:  Soft with active bowel sounds with a transmitted click. There  was no midline pulsation. Femoral pulses were 2+, distal pulses were  intact.  There was no clubbing, cyanosis or edema.  NEUROLOGIC:  Grossly normal.  SKIN:  Warm and dry.   Electrocardiogram dated when you saw her last on March 3  demonstrated  sinus rhythm at 87 with intervals of 0.14/0.13/0.37.   IMPRESSION:  1. Nonischemic cardiomyopathy.  2. Class 2 symptoms.  3. Diabetes.  4. Hypertension.  5. Status post mitral valve replacement.   Deborah [name], Deborah [name] meets ICD indications based on the _________ trials.  We  discussed the potential benefits in the 6-8% range extrapolated from  definite over the three to five year time frame.  We discussed the  potential risks including but not limited to death, perforation,  infection, lead dislodgement, and device malfunction.   She thinks that she wants to do this.  She wants to go home and pray  about it with her friends and family.  Her sister's birthday is coming  up and she would like to delay any decision until that passes.  She is  to let us know if  she would like to proceed.   She was quite tearful during our conversation regarding prognosis  related to this.  I also reviewed with her the prognosis associated with  functional capacity in the of depressed left ventricular function and  that was somewhat mollifying.   If there is any further I can do, please do not hesitate to contact me.  I will let you know if she decides to proceed with ICD implantation.    Sincerely,      Deborah Salvia, MD, [facility]  Electronically Signed    SCK/MedQ  DD: 07/05/2007  DT: 07/06/2007  Job #: [id number]

## 2010-08-24 NOTE — Discharge Summary (Signed)
NAMEAMBERLY, LIVAS NO.:  [id number]   MEDICAL RECORD NO.:  [id number]          PATIENT TYPE:  INP   LOCATION:  5004                         FACILITY:  [facility]   PHYSICIAN:  Vanita Panda. Magnus Ivan, M.D.DATE OF BIRTH:  July 22, 1945   DATE OF ADMISSION:  06/27/2006  DATE OF DISCHARGE:  07/03/2006                               DISCHARGE SUMMARY   ADMITTING DIAGNOSIS:  Right trimalleolar ankle fracture dislocation.   DISCHARGE DIAGNOSIS:  Right trimalleolar ankle fracture dislocation.   PROCEDURE:  Open reduction and internal fixation of right trimalleolar  ankle fracture on June 27, 2006.   HOSPITAL COURSE:  Briefly, [name] is a 64 year old diabetic female  who 10 days prior to admission sustained a right ankle fracture  dislocation.  She had a closed reduction of the ankle fracture and was  admitted.  The General Medicine and Cardiology services were consulted  due to her being on chronic Coumadin for her mitral valve as well as for  diabetic control and she was switched over to Lovenox and allowed to  convalesce in a nursing care facility prior to readmission; this was due  to the soft tissue envelop around her ankle needing to subside due to  significant fracture blistering.  She now presents for admission for  definitive fixation of her ankle.  On the day of admission, she was  taken to the operating room, where she underwent fixation of her ankle  fracture without difficulty.  For detailed description of the operation,  please refer to the dictated operative in the patient's medical record.  Postoperatively, she was started right back on Coumadin due to her  mitral valve and she was kept on Lovenox until she was therapeutic on  her Coumadin.  Her hospital course was uncomplicated and once she was  therapeutic on her Coumadin, it was felt she could be discharged safely  to home with home health physical therapy and PT.  She had an uneventful  hospital  course and was transitioned to an oral diet and again oral  Coumadin, once she became therapeutic.  She was tolerating again a  regular diet as well as oral pain medications and by the day of  discharge, was apparently getting along with therapy enough to be  discharged to home with it being set up for home health PT for  nonweightbearing on her ankle as well as for assistance with ADLs.   DISPOSITION:  Home.   DISCHARGE INSTRUCTIONS:  While she is at home, she is to continue to  progress her gait training, but will remain nonweightbearing on that  ankle until further notice.  Her Coumadin will be followed by the  cardiologist, as she was prior to her injury, as well as her diabetic  control by her primary care physicians.   FOLLOWUP:  Followup will be established in my office in 2 weeks for  reassessment of her wounds as well.      Vanita Panda. Magnus Ivan, M.D.  Electronically Signed     CYB/MEDQ  D:  08/09/2006  T:  08/10/2006  Job:  [id number]

## 2010-08-27 NOTE — H&P (Signed)
NAME:  Deborah [name], Deborah [name]                          ACCOUNT NO.:  [id number]   MEDICAL RECORD NO.:  [id number]                   PATIENT TYPE:  OIB   LOCATION:  2899                                 FACILITY:  [facility]   PHYSICIAN:  Veneda Melter, M.D.                   DATE OF BIRTH:  May 18, 1945   DATE OF ADMISSION:  08/14/2002  DATE OF DISCHARGE:  08/14/2002                                HISTORY & PHYSICAL   HISTORY OF PRESENT ILLNESS:  The patient is a 65 year old female with a  history of nonischemic cardiomyopathy and EF of 20% with normal coronaries  when first seen in 1999.  She has severe MR, moderate pulmonary  hypertension.  She had an echocardiogram  in February which showed an EF of  45%, moderate to severe MR and some aortic valve thickening with an  eccentric jet.  She had a TEE performed on August 02, 2002, by Dr. Andee Lineman and  it showed an EF of 40% with severe left atrial enlargement. She had 3 to 4+  MR with an eccentric jet.  Her ERO was 4.05 sq cm grade IV.  There was no  definite PV flow reversal but difficult to evaluate.  Myxomatous mitral  valve leaflets with anterior prolapse.  Mild right ventricular enlargement,  right atrial enlargement.  Pulmonary hypertension was also seen.  She had  normal aortic, tricuspid and pulmonic valves with a normal left atrial  appendage and PFO.  His evaluation was that she needed referral for mitral  valve repair and the case was discussed with Dr. Glennon Hamilton.  It was felt  that she needed further evaluation of her coronary arteries since her last  CABG five years ago and she was set up for CAT.   The patient describes no chest pain since she was in the hospital.  She  states that she is able to perform her ADLs without any shortness of breath.  She denies any recent weight gain and says that her two pillow orthopnea is  chronic but has not changed recently.  She denies PND. She does not get any  chest pain with ambulation or activities  but she is fairly inactive.  She  occasionally gets shortness of breath when she walks.  She has recent  fatigue but no other symptoms.   PAST MEDICAL HISTORY:  1. This is significant for TEE results as described above.  2. Status post 2-D echocardiogram  in February of 2004 with an EF of 45% and     mild pulmonary hypertension, no PAS given.  3. She also has a history of non-insulin dependent diabetes, admitted for     diet control and hypertension as well as hyperlipidemia.  4. She has a history of CHF.   PAST SURGICAL HISTORY:  C-section as well as tonsillectomy and  adenoidectomy.   SOCIAL HISTORY:  She lives in [place],  [facility] [place] [facility] [place], with her son who  is in college.  She is retired.  She has a very brief history of tobacco use  many years ago and she does not drink alcohol or abuse drugs.  She gets no  exercise and her diet is not diabetic compliant.   FAMILY HISTORY:  Coronary artery disease and obesity.  Her mother died at  age 72 and had her first MI at age 86.  Her father died at age 65 with  coronary artery disease.  She had a sister who died in her 2s with coronary  artery disease.   ALLERGIES:  No known drug allergies.   MEDICATIONS:  1. Lasix 40 mg daily.  2. Lopressor 50 mg one half tablet b.i.d.  3. Digoxin 0.125 mg daily.  4. Lotensin 20 mg daily.  5. Aspirin 325 mg daily.  6. Avandia 4 mg daily.  7. Lipitor 10 mg daily (prescription filled in January with seven tablets     left.   REVIEW OF SYSTEMS:  This is positive for sore throat after her recent TEE  which has since resolved.  She denies any urinary symptoms.  She denies  depression or anxiety.  She has some arthralgias and she gets reflux  symptoms about twice a week but she denies hematemesis, hemoptysis or  melena.  She has no known history of thyroid disease.  The review of systems  is otherwise negative.   PHYSICAL EXAMINATION:  GENERAL APPEARANCE:  She is a well-developed, obese  African  American female in no acute distress.  VITAL SIGNS:  She is afebrile.  Blood pressure is 109/80 with heart rate of  87 and respiratory rate of 22.  HEENT:  Head is normocephalic and atraumatic with pupils are equal, round,  reactive to light and accommodation with extraocular movements intact.  NECK:  There is no JVD appreciated.  No thyromegaly.  There is radiation of  murmur to her left carotid but no carotid bruits are appreciated.  CHEST:  Clear to auscultation bilaterally.  CARDIOVASCULAR:  Her heart has regular rate and rhythm with a 2 to 3/6  systolic ejection murmur best appreciated at the apex.  ABDOMEN:  Obese, soft and nontender with active bowel sounds, no bruit, and  no hepatosplenomegaly appreciated.  EXTREMITIES:  There is no edema.  Distal pulses are 2+ in all four  extremities and no femoral bruits are noted.  MUSCULOSKELETAL:  She has 71/5 age appropriate strength in all four  extremities.  There is no sign of CVA tenderness.  NEUROLOGIC:  She is alert and oriented with no focal deficits noted.   EKG is from July 30, 2002, and it shows sinus rhythm with no acute ischemic  changes.   Chest x-ray performed June 19, 2002, showed stable mild cardiomegaly.   LABORATORY DATA:  Hemoglobin 12.0, hematocrit 37.3, WBC 8.4, platelets 212.  INR 0.9, PTT 28. Sodium 142, potassium 4.6, chloride 104, CO2 30, BUN 8,  creatinine 0.6, glucose 160.   ASSESSMENT AND PLAN:  1. Coronary artery disease.  The patient had a clean catheterization in 1999     but with the need for mitral valve surgery, repeat catheterization is     indicated to assess coronary arteries.  She also had a recent admission     for chest pain, although Cardiolite performed after that admission does     not show any ischemia.  2. Mitral valve prolapse.  See Dr. Margarita Mail report of TEE.  She is  for     referral to Dr. Janey Genta at [facility] for     evaluation. 3. Diabetes and other medical problems will be evaluated by  her regular     cardiologist and her primary care physician with the patient to be     continued on her current medications.     Lavella Hammock, P.A. [facility]                  Veneda Melter, M.D.    RG/MEDQ  D:  08/14/2002  T:  08/15/2002  Job:  [id number]   cc:   Cecil Cranker, M.D.   Lonia Blood, M.D.  [address] [place], [place] [address]  Fax: [phone]

## 2010-08-27 NOTE — Discharge Summary (Signed)
NAME:  Deborah [name], Deborah [name]                ACCOUNT NO.:  [id number]   MEDICAL RECORD NO.:  [id number]          PATIENT TYPE:  INP   LOCATION:  5501                         FACILITY:  [facility]   PHYSICIAN:  Dionne D. Su Hilt, N.P.DATE OF BIRTH:  [birth year]   DATE OF ADMISSION:  01/24/2004  DATE OF DISCHARGE:  01/30/2004                                 DISCHARGE SUMMARY   ADMITTING DIAGNOSES:  1.  Pneumonia in the bases, more left.  2.  Acute bronchitis with asthma.  3.  Valvular heart disease, history of.  4.  Type 2 diabetes mellitus.   DISCHARGE DIAGNOSES:  1.  Pneumonia resolving in the bases, more left.  2.  Asthma and acute bronchitis.  3.  Valvular heart disease.  4.  Hypercholesterolemia.  5.  Type 2 diabetes mellitus.  6.  Acute gastroenteritis with nausea, vomiting, and diarrhea, improved.   REASON FOR HOSPITALIZATION:  This is an admission for a 65 year old, African-  American female, who presented to the ED with persistent cough and shortness  of breath and was admitted for further evaluation and treatment.   ALLERGIES:  No known drug allergies.   LABS AND STUDIES:  On January 27, 2004, EKG showed a normal sinus rhythm  with occasional premature supraventricular complexes.  On chest x-ray of  January 24, 2004, cardiomegaly, possible left effusion versus artifact.  On  October 17th, chest x-ray shows stable chest x-ray with streaky areas of  atelectasis, probable mild edema in the right lung, minimal left basilar  atelectasis.  On January 24, 2004, CBC showed WBC of 9.4, hemoglobin 11.7,  hematocrit 34.9, and platelets 276.  On October 18th, CBC showed a WBC of  6.3, hemoglobin 10.6, hematocrit 31.4, and platelets 258.  On January 24, 2004, PT was 24, INR 2.9.  Chemistries on October 16th were within normal  limits except for a potassium of 3.4, glucose of 134, and BUN of 3. On  January 25, 2004, hemoglobin A1c was 5.8.  On October 15th, October 16th,  and October 17th,  cardiac enzymes were negative for myocardial injury.  On  January 25, 2004, lipid profile, cholesterol was 231, triglyceride was 78,  HDL 48, LDL was 157.  On January 25, 2004, C-reactive protein was 8.5.  On  January 24, 2004, blood cultures showed no growth after five days.   HOSPITAL COURSE:  The patient was admitted.  She was started on two liters  of O2.  EKG was obtained q.12h x6.  Blood culture and sputum culture were  obtained and also a urinalysis with C&S.  She was started on Tequin 400 mg  IV daily, and she was also started on Coreg 6.25 mg p.o. b.i.d.  Chest x-  rays were obtained.  See Labs and Studies section for chest x-ray results.  She had hypokalemia.  K-Dur was initiated.  She was started on Advair Diskus  250/50, and also she was given Albuterol neb treatment 2.5 mg solution q.4h.  For her cough, she was started on Robitussin-DM 10 mL p.o. q.i.d.  She was  started on Avelox p.o.  She had episodes of diarrhea for which Imodium was  given to control her diarrhea.  Protonix was initiated intravenously, but  was given p.o. daily.  Patient complained of nausea and vomiting.  Phenergan  was given 25 mg initially and 12.5 q.3h p.r.n.   DISCHARGE CONDITION:  Patient is discharged home in stable condition.  Patient improving with less cough and wheezing.   DISCHARGE MEDICATIONS:  1.  Advair 250/50 Diskus q.a.m. and q.h.s.  2.  Avelox 400 mg tabs one daily x5 more days.  3.  Lipitor 20 mg tabs one daily.  4.  Continue other meds as noted and noted on the recent health history.  5.  Baby aspirin 81 mg tabs p.o. daily.   DISCHARGE INSTRUCTIONS:  1.  Pain management:  Tylenol tabs 650 mg p.o. q.4h p.r.n. for pain.  2.  Activity:  Ambulate as tolerated.  3.  Diet:  No concentrated sweets, 1800-calorie ADA diet.  4.  Followup appointment:  Call office for followup appointment in November      2005.       DDR/MEDQ  D:  03/20/2004  T:  03/21/2004  Job:  [id number]
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
## 2010-08-27 NOTE — Consult Note (Signed)
NAME:  Deborah [name], Deborah [name]                          ACCOUNT NO.:  [id number]   MEDICAL RECORD NO.:  [id number]                   PATIENT TYPE:  INP   LOCATION:  2024                                 FACILITY:  [facility]   PHYSICIAN:  Doylene Canning. Ladona Ridgel, M.D. [facility]           DATE OF BIRTH:  1946-02-22   DATE OF CONSULTATION:  05/23/2002  DATE OF DISCHARGE:                                   CONSULTATION   REASON FOR CONSULTATION:  Evaluation of chest pain and shortness of breath.   The patient is a very-pleasant 65 year old woman with a history of  congestive heart failure and severe LV dysfunction who underwent  catheterization back in November of 1999.  At that time, she had no evidence  of any coronary disease.  She had 3-4+ mitral regurgitation.  She has severe  LV dysfunction.  She has elevated pulmonary artery pressures as well as  pulmonary capillary wedge pressure with a large V wave suggestive of severe  mitral regurgitation.  The patient apparently did quite well for some time.  She was in her usual state of health until yesterday when she developed  current chest pain and shortness of breath.  Her symptoms continued to  worsen, and she presents for evaluation.  Of note, her chest discomfort is  improved with nitrates.  She denies medical noncompliance.  She denies  increased salt intake.  She denies any syncope.   PAST MEDICAL HISTORY:  Also notable for diabetes, hyperlipidemia,  hypertension and obesity.   FAMILY HISTORY:  Notably for mother who died at age 15 of an myocardial  infarction and sister who died of coronary artery disease in her 84's.  Father died of coronary disease in his 50's.   SOCIAL HISTORY:  The patient is divorced and lives in [place] with her  son.  The patient is a former smoker.  She denies alcohol abuse.   REVIEW OF SYSTEMS:  Notable for no fevers, chills or obvious weight gain.  She denies any vision or hearing problems.  She has chest pain as previous  noted which is without significant radiation.  She notes dyspnea with  exertion.  She also notes peripheral edema.  She does have a chronic cough  which is nonproductive.  She denies nausea, vomiting, diarrhea or  constipation.  She denies dysuria, hematuria or nocturia.  She denies any  numbness, tremor or headache.  She does have occasional episodes of  weakness.  She denies depression or anxiety.   PHYSICAL EXAMINATION:  GENERAL:  She is a pleasant, middle-aged woman,  moderately obese, but in no obvious respiratory distress.  VITAL SIGNS:  Blood pressure 136/74.  Pulse 84 and regular.  Respirations  18.  HEENT:  Normocephalic, atraumatic.  Pupils are equal and round.  Oropharynx  is moist.  Sclerae anicteric.  NECK:  Revealed no jugular venous distention.  There is 7 cm jugular venous  distention.  Carotids 2+ and symmetric.  Trachea midline.  There was no  thyromegaly.  LUNGS:  Revealed rales in the bases.  CARDIOVASCULAR:  Exam revealed regular rate and rhythm with normal S1 and  S2.  Heart sounds are somewhat distant.  There was a grade 1/6 systolic  murmur at the left lower sternal border.  ABDOMEN:  Exam was obese, nontender, nondistended.  There is no obvious  organomegaly.  EXTREMITIES:  Demonstrated trace to 1+ edema bilaterally.  Pulses are 2+ and  symmetric.  NEUROLOGIC:  She was alert and oriented x3 with Cranial nerves II-XII  grossly intact.  Ankle is 5/5 and symmetric.  The EKG demonstrates normal  sinus rhythm with left axis deviation and biatrial enlargement.  There are  occasional PVC's.  There are no acute ST-T wave abnormalities noted.  Digoxin level is 0.3.  CK was 432 with an MB of 3.2, troponin was negative.   IMPRESSION:  1. Atypical chest pain.  2. Congestive heart failure.  3. Moderate-to-severe mitral regurgitation with a history of pulmonary     hypertension, presumed secondary to left heart pressure increases     consistent with severe mitral  Regurgitation.  4. Diabetes.  5. Chronic obstructive pulmonary disease.  6. Hypertension.  7. Obesity.   DISCUSSION:  Her chest pain is atypical for unstable angina, and her  catheterization in November of 1999 did not demonstrate any coronary disease  in the setting of severe LV dysfunction with severe MR.  At this point, I  would not recommend any additional invasive cardiac workup unless the  patient has clear-cut non-Q wave myocardial infarction.  Would recommend  checking D-Dimers and consider a chest CT if the D-Dimer is positive.  Her  chest pain syndrome may be related to pulmonary hypertension and worsening  mitral regurgitation.  For this reason,  I would recommend IV Lasix and  check a BNP and use long-acting nitrates.  We will plan on obtaining a 2-D  echocardiogram as well to clarify the status of her pulmonary artery  pressures.                                               Doylene Canning. Ladona Ridgel, M.D. [facility]    GWT/MEDQ  D:  05/23/2002  T:  05/23/2002  Job:  [id number]

## 2010-08-27 NOTE — Discharge Summary (Signed)
NAMEAVARI, NEVARES NO.:  [id number]   MEDICAL RECORD NO.:  [id number]          PATIENT TYPE:  INP   LOCATION:  4711                         FACILITY:  [facility]   PHYSICIAN:  Mina Marble, M.D.DATE OF BIRTH:  07-Jul-1945   DATE OF ADMISSION:  04/09/2005  DATE OF DISCHARGE:  04/15/2005                                 DISCHARGE SUMMARY   DISCHARGE DIAGNOSES:  1.  Acute gastritis with uncontrolled vomiting without hemorrhage.  2.  Congestive heart failure severe.  3.  Ascites probably due to congestive heart failure.  4.  Paroxysmal ventricular tachycardia short nonsustained.  5.  Long term anticoagulant medication with Coumadin.  6.  Hypertensive heart disease.  7.  Diabetes mellitus type 2 adult onset.  8.  Obesity moderate.  9.  Cardiomegaly.  10. Asthma controlled.  11. Gastroduodenitis.  12. Valvular heart disease status post mitral valve heart surgery at [facility].  13. Hypopotassemia and hypokalemia treated and improved.   BRIEF HISTORY:  The patient is a 65 year old black female with a known  history of mitral valve heart disease status post surgical treatment who  presented to the [facility] emergency department with  complaint of recurrent nausea and vomiting that was uncontrolled even in the  emergency department. The etiology of the nausea and vomiting was unclear.  The patient was taking the anticoagulant medication Coumadin as well as her  congestive heart failure medications. She was taking Toprol XL 50 mg tabs as  her beta blocker.   PAST HISTORY:  The patient has a long past history of mitral valve heart  disease in which she was treated at [facility] with  mitral valve surgery approximately 3 years ago.   REVIEW OF SYSTEMS:  Head, eyes, ears, nose and throat, cardiopulmonary,  neuromuscular symptoms and GI symptoms to be as noted.   ALLERGIES:  The patient has no known  drug allergies.   PHYSICAL EXAMINATION:  VITAL SIGNS: On admission the patient was found to  have a temperature of 98, pulse 88, respirations 20 and the blood pressure  126/92 to 140/79.  HEAD, EYES, EARS, NOSE AND THROAT: Examination of the head, eyes, ears, nose  and throat revealed the patient to be coherent. The eyes revealed the pupils  to be equal, round, and reactive to light.  NECK: The neck was supple with no masses or thyroid enlargement.  LUNGS: The lungs were clear to auscultation.  HEART: The heart revealed a regular rhythm with a Grade I to II/VI systolic  ejection murmur at the base and along the left sternal border.  ABDOMEN: The abdomen was moderately obese with mild epigastric tenderness,  but no masses felt.  EXTREMITIES: The extremities revealed good strength, but no edema.   RADIOLOGY:  The CT scan of the abdomen revealed perihepatic ascites with no  intra-abdominal mass. There was marked cardiomegaly noted. The 2D  echocardiogram performed on April 13, 2005 revealed the right ventricle was  mildly dilated. There was moderate tricuspid valvular regurgitation. There  was a  prosthetic mitral valve leaflet which appeared normal. There was no  significant perivalvular mitral regurgitation. There was significant left  ventricular dysfunction and the posterolateral walls moved better than other  walls. The mechanical mitral prosthesis was working well. There was some  right ventricular dysfunction and pulmonary hypertension. There may be  incomplete coaptation of the tricuspid valve. This interpretation was by the  bio-cardiology service.   LABORATORY DATA:  Laboratory data revealed the patient to have some mild  anemia on repeat CBC on April 10, 2005 with a hemoglobin down to 11.8  grams percent and a hematocrit of 35.0. Blood chemistries on April 09, 2005 revealed a slight elevation in the glucose to 128 mg percent, but was  normal on April 10, 2005 at 96  mg percent. On April 12, 2005 after  diuretic therapy, her serum potassium did drop to 2.8 milliequivalents per  liter, but was corrected with potassium chloride supplement and on April 15, 2005 her serum potassium was back up to 4.2 milliequivalents per liter  with a normal BUN of 8 and a normal serum creatinine of 0.7 mg percent. The  patient continued to improve and was discharged on April 15, 2005.   DISCHARGE MEDICATIONS:  1.  Lasix 40 mg p.o. b.i.d.  2.  Quinapril 20 mg p.o. daily.  3.  Metformin 500 mg p.o. b.i.d.  4.  Coumadin 10 mg p.o. daily.  5.  Aspirin 81 mg p.o. daily.  6.  Lanoxin was added 0.125 mg tabs p.o. daily.  7.  Coreg was started 3.125 mg tabs 1 p.o. daily.  8.  Toprol was discontinued.  9.  K-Dur20 milliequivalent tabs p.o. was increased to t.i.d. p.o.  10. Protonix 40mg  or similar med 1 daily  The patient was to have a follow-up in the office on May 10, 2005.      Mina Marble, M.D.  Electronically Signed     GRK/MEDQ  D:  06/20/2005  T:  06/21/2005  Job:  [id number]

## 2010-08-27 NOTE — Discharge Summary (Signed)
NAME:  Deborah [name], Deborah [name]                          ACCOUNT NO.:  [id number]   MEDICAL RECORD NO.:  [id number]                   PATIENT TYPE:  INP   LOCATION:  2024                                 FACILITY:  [facility]   PHYSICIAN:  Alvester Morin, M.D.               DATE OF BIRTH:  [birth year]   DATE OF ADMISSION:  05/22/2002  DATE OF DISCHARGE:  05/24/2002                                 DISCHARGE SUMMARY   DISCHARGE DIAGNOSES:  1. Atypical chest pain.  2. Congestive heart failure.  3. Mitral regurgitation, moderate to severe.  4. Diabetes mellitus, well controlled.  5. Hypertension.  6. Hypercholesterolemia.   DISCHARGE MEDICATIONS:  1. Digoxin 0.25 mg p.o. daily.  2. Metoprolol 25 mg p.o. b.i.d.  3. Lasix 40 mg p.o. daily.  4. Lotensin 20 mg p.o. daily.  5. Nitroglycerin tablets 0.4 mg sublingual every five minutes p.r.n. chest     pain.  6. Avandia take as before.  7. Lipitor continue previous dose.  8. Aspirin 325 mg p.o. daily.   PROCEDURE:  A 2-D echocardiogram (May 23, 2002).  Indication:  Congestive heart failure, acute.  This showed prominent left ventricular  dysfunction, ejection fraction estimated at 45%.  This also showed moderate  to severe mitral regurgitation with a dilated left atrium.   CONSULTANTS:  Dr. Lewayne Bunting, cardiology.   ADMISSION HISTORY AND PHYSICAL:  This is a 65 year old African American  woman with history of diabetes, high blood pressure, hypercholesterolemia,  and obesity with a hospitalization in 1999 for chest pain with a  catheterization at that time that showed no coronary artery disease but  prominent left ventricular dysfunction and mitral regurgitation who day of  admission, around 6:30 p.m., approximately two hours prior to my seeing her,  developed squeezing substernal chest pain which she described as a tightness  without radiation which developed while she was walking to the store from  her home, which the store is  approximately one mile from her home.  This  chest pain continued throughout the walk and while shopping for  approximately 30 minutes and persisted until she got all the way back home  and was relieved somewhat when she laid down on the couch.  She does not  take nitroglycerin tablets at home.  She also describes one episode of  emesis she experienced on the way home from the store and nausea which  accompanied the onset and duration of this chest pain.  She states that she  has not had recent chest pain; however, she does describe that immediately  following her hospitalization in 1999 she did have occasional anginal  symptoms for which she took nitroglycerin.  She denies recent URI symptoms,  cough, fevers, or other GI symptoms until the onset of chest pain this  evening.  She does endorse occasional coughing episodes which began after  her hospitalization in 1999, occur every couple weeks  where she coughs for  periods of time, up to a couple hours; never produces sputum and these  coughs take a lot out of her, in her words.  She denies orthopnea and recent  lower extremity edema.  She also maintains that she has been taking all of  her home medications recently and has not had a change in diet or an  increase in salt increase.   ALLERGIES:  No known drug allergies.   PAST MEDICAL HISTORY:  1. History of congestive heart failure, questionable ischemic component.     Hospitalization in 1999 with catheterization that showed prominent left     ventricular dysfunction, mitral regurgitation, left atrial dilatation.     without evidence of coronary artery disease.  2. Bronchitis, very brief and distant history of smoking.  3. Diabetes, well controlled.  4. Hypercholesterolemia, on Lipitor.  5. Hypertension, well controlled.   MEDICATIONS:  The patient did not bring in her medicines and dosages were  estimated from prior hospital and clinic records.  1. Lanoxin.  2. Lasix.  3.  Lopressor.  4. Aspirin.  5. Lipitor.  6. Lotensin.  7. Avandia.   SOCIAL HISTORY:  Former smoker for less than one pack a day for less than  one year in the distant past.  No alcohol, cocaine, or drugs.  She is  divorced, on Medicare, and is enrolled in a medical assistance program  whereby she gets her heart medicines at a $5 copay.  She lives in [place]  with her son.   FAMILY HISTORY:  Mother had an MI at age 40.  Father is deceased at age 38  with coronary artery disease.  Sister has the passed away in her 20s with  coronary artery disease.  Son is 24 and healthy.   REVIEW OF SYSTEMS:  As per HPI.   PHYSICAL EXAMINATION:  VITAL SIGNS:  Pulse of 84, blood pressure 136/74,  temperature 97.8 Fahrenheit, respiratory rate 18, O2 saturation 98% on room  air.  GENERAL:  This is a middle-aged [place] American female in mild acute  distress.  HEENT:  Benign.  Notable for no carotid bruits, no lymphadenopathy.  She  does have a soft boggy fullness under her TMJ's bilaterally; however, no JVD  was appreciated.  LUNGS:  Clear to auscultation without crackles bilaterally and good air  movement.  HEART:  Regular rate and rhythm.  No murmurs were appreciated.  She had no  substernal heave.  She did, however, have some mild sharp tenderness to  palpation along the left sternal border.  She had no pulsus paradoxus with  an inspiratory decrease in systolic blood pressure estimated between 5-6  mmHg.  ABDOMEN:  Soft, nontender, normoactive bowel sounds, and obese.  EXTREMITIES:  She had trace bilateral lower extremity edema with good  dorsalis pedis pulses estimated at 2+ bilaterally.  NEUROLOGIC:  Nonfocal.  SKIN:  No rashes.  RECTAL:  Exam was deferred on admission.   LABORATORY DATA:  Additional information includes a digoxin level of 0.3.  CBC showed a white blood count of 2.2 with an ANC of 7.1, hemoglobin of 12,  and platelets of 212.  MCV was 89.3 and RDW of 13.3.  Initial  cardiac enzymes showed a CK of 432, an MB of 3.2, a troponin-I of 0.02.   A portable chest x-ray, which was compared with a two-view chest  approximately two years ago showed interval increase in her cardiac  silhouette with prominent cardiomegaly but no infiltrates or  pulmonary  edema.  An EKG was performed which showed left axis deviation and prominent  left atrial enlargement; however, the rhythm was sinus at 81 beats per  minute.  T-wave inversion seen in aVL and V1 were present in an earlier EKG  dated May 2002.   HOSPITAL COURSE:  The patient was admitted and began on anticoagulation with  full-dose Lovenox.  It was thought that this presentation could be most  consistent with unstable angina versus atypical chest pain.  Cardiac consult  was called and the patient was restarted on her home medications to include  her beta-blocker, Lotensin, and digoxin.  Nitroglycerin was given x1 with  some relief in chest pain.  Her chest pain notably had lingered from  original description over the three hours that she was in the emergency  department and basically persisted until she received her nitroglycerin  tablets in the evening of the day of admission.   Problem 1:  CHEST PAIN:  Symptom of chest pain resolved by hospital day #2  in the a.m. and did not recur.  She was maintained on anticoagulation  throughout her hospital stay with heparin at 100 mg subcu b.i.d.  She had  negative cardiac enzymes x3.  A repeat two-view chest x-ray showed prominent  cardiac enlargement as seen on the first portable film.  The patient was  administered Lasix IV 40 mg daily (was previously on 40 mg p.o. Lasix daily  prior to admission).  Review of her previous records revealed a history of  right atrial enlargement and left ventricular dysfunction with mitral  regurgitation in the past.  It was thought that her history of chest pain  was atypical for unstable angina; however, early on ischemic etiology  was  still fairly high in the differential.  The patient continued to improve on  hospital day #1 and was seen roaming the halls without much discomfort by  the end of hospital day #2 and by the end of hospital day #3.  An  echocardiogram was performed on February 12 which showed an ejection  fraction of 45% and moderate to severe mitral regurgitation.  Her consulting  cardiologist discussed the results of this with her, as well as future plans  for cardiac followup including her options for future interventions which  include medical and surgical methods.  Other etiologies of chest pain were  entertained, particularly since this was an atypical presentation, and a D-  dimer was ordered, which was essentially negative, particularly given her  acute illness and was reassuring against the possibility of PE, given a low  pretest probability.   Problem 2:  DIABETES MELLITUS:  The patient was not kept on her home  medication of Avandia during this hospitalization; however, she was placed on an ADA diet and ran blood sugars in the 140-160 range off medications.  She was restarted on her home medications following discharge.   Problem 3:  ANEMIA:  The patient on admission had a hemoglobin of 12, which  was 11.6 hospital day #2.  This is just barely below the lower limit of  normal; however, given her age, stool was checked for blood, which was  negative.  We suggest followup and have arranged for followup in the  outpatient clinic here with Dr. Lavera Guise on [facility] and further workup including  a CBC and possibly iron, B12, RBC folate studies can be done at that time.  It is possible that her etiology, given the normocytic nature of this  anemia, which is mild, could just as likely be an anemia of chronic disease.   Problem 4:  HYPERLIPIDEMIA:  Lipids checked in the hospital were very  reassuring.  The patient was on Lipitor as an outpatient basis and this was  restarted upon discharge.   Problem  5:  HYPERTENSION:  Blood pressure was very well controlled;  systolics in the 130s on her beta-blocker and ACE.   Problem 6:  DISPOSITION:  The presentation and echocardiogram findings for  the patient are worrisome for significant progression of her left  ventricular dysfunction.  She has mitral regurgitation estimated at moderate  to severe by echocardiogram and her left atrial enlargement has, by many  indexes including EKG, seemed to have worsened significantly.  She is being  followed by Dr. Ladona Ridgel and apparently Dr. Corinda Gubler, who have been her  cardiologists since 1999, and they have urged her to continue to see them  for management of this serious disease.  I have advised her that if she is  not contacted for a followup appointment in the next week, she should call  their office to schedule such an appointment, as followup for this issue is  very important.     Dante Gang, M.D.                     Alvester Morin, M.D.    JP/MEDQ  D:  05/24/2002  T:  05/25/2002  Job:  [id number]   cc:   Lonia Blood, M.D.  [address] [place], [place] [address]  Fax: [phone]   Doylene Canning. Ladona Ridgel, M.D. [facility]
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
## 2010-08-27 NOTE — Cardiovascular Report (Signed)
NAME:  Deborah [name], Deborah [name]                          ACCOUNT NO.:  [id number]   MEDICAL RECORD NO.:  [id number]                   PATIENT TYPE:  OIB   LOCATION:  2899                                 FACILITY:  [facility]   PHYSICIAN:  Veneda Melter, M.D.                   DATE OF BIRTH:  1945/10/17   DATE OF PROCEDURE:  08/14/2002  DATE OF DISCHARGE:  08/14/2002                              CARDIAC CATHETERIZATION   PROCEDURES PERFORMED:  1. Right heart catheterization.  2. Left heart catheterization.  3. Left ventriculogram.  4. Elective coronary angiography.   DIAGNOSES:  1. Mild coronary disease at the atrium.  2. Mild left ventricular systolic dysfunction.  3. 3+ mitral regurgitation.  4. Severe pulmonary hypertension.   HISTORY OF PRESENT ILLNESS:  The patient is a 65 year old black female with  a history of mitral valve regurgitation who presents for further assessment.  Recently the patient has been found to have LV dysfunction by  echocardiogram. She has also had some chest discomfort. A stress imaging  study showed attenuation of the anterior wall but no ischemia. She is  referred for further cardiac assessment.   DESCRIPTION OF PROCEDURE:  Informed consent was obtained and the patient was  brought  to the catheterization laboratory. An 8 [place] intravenous and 6  [place] arterial sheath were placed in the right groin using the modified  Seldinger technique. A 7.5 PA catheter was advanced to the pulmonary artery.  Appropriate right-sided hemodynamics were obtained.   The 6 French pigtail catheter was then advanced to the left ventricle and  appropriate left-sided hemodynamics were obtained. A left ventriculogram was  then performed using power injection contrast. The pigtail catheter was then  removed and a 6 [place] JL-4 and JR-4 catheter was used to engage the left  and right coronary arteries and selective angiography was performed was  performed in the varus projections using  manual injection of contrast.   After termination of the case the catheters and sheaths were removed. Manual  pressure was applied until adequate hemostasis was achieved. The patient  tolerated the procedure well and was transferred to the floor in stable  condition.   FINDINGS:  1. Right heart catheterization:  RA equals 20/18, mean equals 16, RV equals     54/12, PA equals 52/23.  2. Pulmonary capillary wedge pressure is 28/26, mean equals 22. Mean mitral     valve gradient is 3.9 mmHg.  3. Cardiac output is 4.5 liters per minute with a  cardiac index of 2.2     liters per minute per square meter by Fick method.  4. The air saturation is 93%.  5. PA saturation 64% on room air.  6. Hemoglobin is 12.0 mg/dL.   Left heart catheterization:  1. Left main trunk, a large caliber vessel with mild irregularities.  2. The left anterior descending artery is large caliber vessel that  supplies     several small diagonal branches. The LAD has mild disease of 20% in the     mid and distal sections. The  1st diagonal branch has moderate disease at     30% in the proximal segment.  3. The left circumflex artery is a medium caliber vessel that gives rise to     3 marginal branches. The left circumflex system has mild disease at 20%     in the AV segment.  4. The right coronary artery is dominant. This is a medium caliber vessel     comprised of the posterior descending artery and several small posterior     ventricular branches. The right coronary artery has luminal disease.  5. Ramus intermedius. This is a medium caliber vessel. __________     anterolateral wall tapers at its midsection. There is mild disease of     30%.  6. Left ventricle. Dilated in systolic and end diastolic dimensions. Overall     left ventricular function is at least mildly impaired. Ejection fraction     approximately 40%. There is 3% mitral regurgitation with mild left atrial     enlargement. Left ventricular pressure is  145/15, aortic was 135/90, LV     end diastolic pressure equals 20.   ASSESSMENT AND PLAN:  The patient is a 65 year old black female with mitral  regurgitation who now has worsening left ventricular function as well as  worsening pulmonary hypertension. She has  mild coronary artery disease. The  patient will be referred for mitral valve repair.                                                 Veneda Melter, M.D.    NG/MEDQ  D:  08/14/2002  T:  08/15/2002  Job:  [id number]   cc:   Lonia Blood, M.D.  [address] [place], [place] [address]  Fax: [phone]   E. Graceann Congress, M.D.

## 2010-08-30 ENCOUNTER — Ambulatory Visit (INDEPENDENT_AMBULATORY_CARE_PROVIDER_SITE_OTHER): Payer: No Typology Code available for payment source | Admitting: *Deleted

## 2010-08-30 DIAGNOSIS — I059 Rheumatic mitral valve disease, unspecified: Secondary | ICD-10-CM

## 2010-08-30 DIAGNOSIS — Z952 Presence of prosthetic heart valve: Secondary | ICD-10-CM

## 2010-09-03 ENCOUNTER — Encounter (HOSPITAL_COMMUNITY): Payer: Self-pay | Admitting: Radiology

## 2010-09-03 ENCOUNTER — Emergency Department (HOSPITAL_COMMUNITY): Payer: No Typology Code available for payment source

## 2010-09-03 ENCOUNTER — Emergency Department (HOSPITAL_COMMUNITY)
Admission: EM | Admit: 2010-09-03 | Discharge: 2010-09-03 | Disposition: A | Discharge status: Home / Self Care | Payer: No Typology Code available for payment source | Attending: Emergency Medicine | Admitting: Emergency Medicine

## 2010-09-03 DIAGNOSIS — M545 Low back pain, unspecified: Secondary | ICD-10-CM | POA: Insufficient documentation

## 2010-09-03 DIAGNOSIS — E119 Type 2 diabetes mellitus without complications: Secondary | ICD-10-CM | POA: Insufficient documentation

## 2010-09-03 DIAGNOSIS — I447 Left bundle-branch block, unspecified: Secondary | ICD-10-CM | POA: Insufficient documentation

## 2010-09-03 DIAGNOSIS — I10 Essential (primary) hypertension: Secondary | ICD-10-CM | POA: Insufficient documentation

## 2010-09-03 DIAGNOSIS — K5730 Diverticulosis of large intestine without perforation or abscess without bleeding: Secondary | ICD-10-CM | POA: Insufficient documentation

## 2010-09-03 DIAGNOSIS — R110 Nausea: Secondary | ICD-10-CM | POA: Insufficient documentation

## 2010-09-03 LAB — COMPREHENSIVE METABOLIC PANEL
ALT: 22 U/L (ref 0–35)
AST: 20 U/L (ref 0–37)
Calcium: 9.5 mg/dL (ref 8.4–10.5)
GFR calc Af Amer: >60 mL/min (ref >60)
Sodium: 141 mEq/L (ref 135–145)
Total Protein: 7.4 g/dL (ref 6.0–8.3)

## 2010-09-03 LAB — URINE MICROSCOPIC-ADD ON

## 2010-09-03 LAB — DIFFERENTIAL
Basophils Absolute: 0 10*3/uL (ref 0.0–0.1)
Basophils Relative: 0 % (ref 0–1)
Eosinophils Absolute: 0.2 10*3/uL (ref 0.0–0.7)
Eosinophils Relative: 2 % (ref 0–5)

## 2010-09-03 LAB — URINALYSIS, ROUTINE W REFLEX MICROSCOPIC
Bilirubin Urine: NEGATIVE
Nitrite: NEGATIVE
Specific Gravity, Urine: >1.03 — ABNORMAL HIGH (ref 1.005–1.030)
pH: 5.5 (ref 5.0–8.0)

## 2010-09-03 LAB — CBC
Platelets: 196 10*3/uL (ref 150–400)
RDW: 13.3 % (ref 11.5–15.5)
WBC: 8.2 10*3/uL (ref 4.0–10.5)

## 2010-09-03 LAB — GLUCOSE, CAPILLARY: Glucose-Capillary: 190 mg/dL — ABNORMAL HIGH (ref 70–99)

## 2010-09-03 LAB — LIPASE, BLOOD: Lipase: 65 U/L — ABNORMAL HIGH (ref 11–59)

## 2010-09-13 ENCOUNTER — Ambulatory Visit (INDEPENDENT_AMBULATORY_CARE_PROVIDER_SITE_OTHER): Payer: No Typology Code available for payment source | Admitting: *Deleted

## 2010-09-13 DIAGNOSIS — Z952 Presence of prosthetic heart valve: Secondary | ICD-10-CM

## 2010-09-13 DIAGNOSIS — I059 Rheumatic mitral valve disease, unspecified: Secondary | ICD-10-CM

## 2010-09-20 ENCOUNTER — Telehealth: Payer: Self-pay | Admitting: Internal Medicine

## 2010-09-20 NOTE — Telephone Encounter (Signed)
ROI Mailed to PT 09/20/10/km

## 2010-10-04 ENCOUNTER — Ambulatory Visit (INDEPENDENT_AMBULATORY_CARE_PROVIDER_SITE_OTHER): Payer: No Typology Code available for payment source | Admitting: *Deleted

## 2010-10-04 DIAGNOSIS — Z952 Presence of prosthetic heart valve: Secondary | ICD-10-CM

## 2010-10-04 DIAGNOSIS — I059 Rheumatic mitral valve disease, unspecified: Secondary | ICD-10-CM

## 2010-10-04 LAB — POCT INR: INR: 4

## 2010-10-11 ENCOUNTER — Ambulatory Visit (INDEPENDENT_AMBULATORY_CARE_PROVIDER_SITE_OTHER): Payer: No Typology Code available for payment source | Admitting: *Deleted

## 2010-10-11 DIAGNOSIS — I059 Rheumatic mitral valve disease, unspecified: Secondary | ICD-10-CM

## 2010-10-11 DIAGNOSIS — Z952 Presence of prosthetic heart valve: Secondary | ICD-10-CM

## 2010-11-01 ENCOUNTER — Ambulatory Visit (INDEPENDENT_AMBULATORY_CARE_PROVIDER_SITE_OTHER): Payer: No Typology Code available for payment source | Admitting: *Deleted

## 2010-11-01 DIAGNOSIS — I059 Rheumatic mitral valve disease, unspecified: Secondary | ICD-10-CM

## 2010-11-01 DIAGNOSIS — I5022 Chronic systolic (congestive) heart failure: Secondary | ICD-10-CM

## 2010-11-01 DIAGNOSIS — I428 Other cardiomyopathies: Secondary | ICD-10-CM

## 2010-11-01 DIAGNOSIS — Z952 Presence of prosthetic heart valve: Secondary | ICD-10-CM

## 2010-11-01 DIAGNOSIS — Z95810 Presence of automatic (implantable) cardiac defibrillator: Secondary | ICD-10-CM

## 2010-11-01 NOTE — Progress Notes (Signed)
icd check in clinic  

## 2010-11-11 ENCOUNTER — Ambulatory Visit (INDEPENDENT_AMBULATORY_CARE_PROVIDER_SITE_OTHER): Payer: No Typology Code available for payment source | Admitting: *Deleted

## 2010-11-11 DIAGNOSIS — I059 Rheumatic mitral valve disease, unspecified: Secondary | ICD-10-CM

## 2010-11-11 DIAGNOSIS — Z952 Presence of prosthetic heart valve: Secondary | ICD-10-CM

## 2010-11-11 DIAGNOSIS — Z954 Presence of other heart-valve replacement: Secondary | ICD-10-CM

## 2010-11-25 ENCOUNTER — Ambulatory Visit (INDEPENDENT_AMBULATORY_CARE_PROVIDER_SITE_OTHER): Payer: No Typology Code available for payment source | Admitting: *Deleted

## 2010-11-25 DIAGNOSIS — I059 Rheumatic mitral valve disease, unspecified: Secondary | ICD-10-CM

## 2010-11-25 DIAGNOSIS — Z952 Presence of prosthetic heart valve: Secondary | ICD-10-CM

## 2010-11-25 LAB — POCT INR: INR: 2.3

## 2010-12-09 ENCOUNTER — Ambulatory Visit (INDEPENDENT_AMBULATORY_CARE_PROVIDER_SITE_OTHER): Payer: No Typology Code available for payment source | Admitting: *Deleted

## 2010-12-09 DIAGNOSIS — Z952 Presence of prosthetic heart valve: Secondary | ICD-10-CM

## 2010-12-09 DIAGNOSIS — I059 Rheumatic mitral valve disease, unspecified: Secondary | ICD-10-CM

## 2010-12-09 LAB — POCT INR: INR: 1.9

## 2010-12-23 ENCOUNTER — Ambulatory Visit (INDEPENDENT_AMBULATORY_CARE_PROVIDER_SITE_OTHER): Payer: No Typology Code available for payment source | Admitting: *Deleted

## 2010-12-23 DIAGNOSIS — Z952 Presence of prosthetic heart valve: Secondary | ICD-10-CM

## 2010-12-23 DIAGNOSIS — I059 Rheumatic mitral valve disease, unspecified: Secondary | ICD-10-CM

## 2010-12-23 LAB — POCT INR: INR: 3.8

## 2011-01-04 LAB — PROTIME-INR
INR: 1.1
Prothrombin Time: 14.7

## 2011-01-06 ENCOUNTER — Ambulatory Visit (INDEPENDENT_AMBULATORY_CARE_PROVIDER_SITE_OTHER): Payer: No Typology Code available for payment source | Admitting: *Deleted

## 2011-01-06 DIAGNOSIS — Z952 Presence of prosthetic heart valve: Secondary | ICD-10-CM

## 2011-01-06 DIAGNOSIS — Z954 Presence of other heart-valve replacement: Secondary | ICD-10-CM

## 2011-01-06 DIAGNOSIS — I059 Rheumatic mitral valve disease, unspecified: Secondary | ICD-10-CM

## 2011-01-06 LAB — CBC
MCHC: 34.2
MCV: 91.6
Platelets: 245
RBC: 3.71 — ABNORMAL LOW
RDW: 13.7

## 2011-01-06 LAB — DIFFERENTIAL
Basophils Absolute: 0
Basophils Relative: 0
Eosinophils Absolute: 0.2
Neutro Abs: 5.8
Neutrophils Relative %: 72

## 2011-01-06 LAB — URINALYSIS, ROUTINE W REFLEX MICROSCOPIC
Glucose, UA: NEGATIVE
Ketones, ur: 15 — AB
Protein, ur: NEGATIVE
Urobilinogen, UA: 1

## 2011-01-06 LAB — POCT I-STAT, CHEM 8
BUN: 18
Calcium, Ion: 1.2
Chloride: 105
Creatinine, Ser: 1.1
Glucose, Bld: 92
HCT: 35 — ABNORMAL LOW
Hemoglobin: 11.9 — ABNORMAL LOW
Potassium: 4.9
Sodium: 139
TCO2: 27

## 2011-01-06 LAB — URINE MICROSCOPIC-ADD ON

## 2011-01-06 LAB — PROTIME-INR
INR: 2.6 — ABNORMAL HIGH
Prothrombin Time: 28.5 — ABNORMAL HIGH

## 2011-01-20 LAB — URINALYSIS, ROUTINE W REFLEX MICROSCOPIC
Glucose, UA: NEGATIVE
Ketones, ur: 15 — AB
Nitrite: POSITIVE — AB
Protein, ur: >300 — AB
Specific Gravity, Urine: 1.02
Urobilinogen, UA: 1
pH: 6.5

## 2011-01-20 LAB — URINE MICROSCOPIC-ADD ON

## 2011-01-21 LAB — URINE CULTURE: Colony Count: 30000

## 2011-01-21 LAB — COMPREHENSIVE METABOLIC PANEL
Albumin: 3.8
BUN: 19
BUN: 19
CO2: 28
Calcium: 8.7
Chloride: 103
Creatinine, Ser: 1.32 — ABNORMAL HIGH
Creatinine, Ser: 1.33 — ABNORMAL HIGH
GFR calc non Af Amer: 41 — ABNORMAL LOW
Glucose, Bld: 165 — ABNORMAL HIGH
Total Bilirubin: 0.8
Total Protein: 7.1

## 2011-01-21 LAB — BASIC METABOLIC PANEL
BUN: 6
BUN: 16
BUN: 19
CO2: 25
CO2: 27
CO2: 27
Calcium: 8.6
Calcium: 8.3 — ABNORMAL LOW
Chloride: 108
Chloride: 108
Chloride: 112
Creatinine, Ser: 0.94
Creatinine, Ser: 1.52 — ABNORMAL HIGH
Creatinine, Ser: 1.65 — ABNORMAL HIGH
GFR calc Af Amer: 38 — ABNORMAL LOW
GFR calc non Af Amer: >60
GFR calc non Af Amer: 31 — ABNORMAL LOW
Glucose, Bld: 104 — ABNORMAL HIGH
Glucose, Bld: 139 — ABNORMAL HIGH
Glucose, Bld: 111 — ABNORMAL HIGH
Glucose, Bld: 76
Potassium: 4.1
Potassium: 4.2
Potassium: 5
Sodium: 140
Sodium: 142

## 2011-01-21 LAB — DIFFERENTIAL
Basophils Absolute: 0.1
Basophils Relative: 1
Eosinophils Absolute: 0
Eosinophils Relative: 0
Lymphocytes Relative: 4 — ABNORMAL LOW
Lymphs Abs: 0.6 — ABNORMAL LOW
Monocytes Absolute: 0.7
Monocytes Relative: 5
Neutro Abs: 13.1 — ABNORMAL HIGH
Neutrophils Relative %: 90 — ABNORMAL HIGH

## 2011-01-21 LAB — CBC
HCT: 25.9 — ABNORMAL LOW
HCT: 26.2 — ABNORMAL LOW
HCT: 27.4 — ABNORMAL LOW
HCT: 28.9 — ABNORMAL LOW
HCT: 36.3
Hemoglobin: 8.7 — ABNORMAL LOW
Hemoglobin: 12.2
Hemoglobin: 9.2 — ABNORMAL LOW
MCHC: 33.3
MCHC: 33.5
MCHC: 33.7
MCV: 90.3
MCV: 89.3
MCV: 90.5
MCV: 91.4
Platelets: 223
Platelets: 207
Platelets: 213
Platelets: 282
RBC: 2.87 — ABNORMAL LOW
RBC: 3.03 — ABNORMAL LOW
RBC: 4.06
RDW: 13.5
RDW: 12.9
RDW: 13.3
RDW: 13.4
WBC: 7.6
WBC: 14.6 — ABNORMAL HIGH
WBC: 8.6

## 2011-01-21 LAB — I-STAT 8, (EC8 V) (CONVERTED LAB)
Bicarbonate: 28.2 — ABNORMAL HIGH
Glucose, Bld: 183 — ABNORMAL HIGH
Sodium: 137
TCO2: 29
pH, Ven: 7.465 — ABNORMAL HIGH

## 2011-01-21 LAB — LIPID PANEL
Cholesterol: 129
HDL: 42
LDL Cholesterol: 71
Total CHOL/HDL Ratio: 3.1
Triglycerides: 79
VLDL: 16

## 2011-01-21 LAB — COMPREHENSIVE METABOLIC PANEL WITH GFR
ALT: 17
AST: 23
Albumin: 4.1
Alkaline Phosphatase: 66
Calcium: 9.5
GFR calc Af Amer: 49 — ABNORMAL LOW
Glucose, Bld: 164 — ABNORMAL HIGH
Potassium: 4.5
Sodium: 141
Total Protein: 7.7

## 2011-01-21 LAB — PROTIME-INR
INR: 1
INR: 1
INR: 2.2 — ABNORMAL HIGH
Prothrombin Time: 13.5
Prothrombin Time: 25 — ABNORMAL HIGH
Prothrombin Time: 29.1 — ABNORMAL HIGH

## 2011-01-21 LAB — LIPASE, BLOOD: Lipase: 47

## 2011-01-21 LAB — URINALYSIS, ROUTINE W REFLEX MICROSCOPIC
Bilirubin Urine: NEGATIVE
Glucose, UA: NEGATIVE
Glucose, UA: NEGATIVE
Ketones, ur: 15 — AB
Leukocytes, UA: NEGATIVE
Nitrite: NEGATIVE
Nitrite: POSITIVE — AB
Protein, ur: >300 — AB
Protein, ur: >300 — AB
Specific Gravity, Urine: 1.015
Specific Gravity, Urine: 1.024
Urobilinogen, UA: 0.2
Urobilinogen, UA: 0.2
Urobilinogen, UA: 0.2
pH: 5.5

## 2011-01-21 LAB — CULTURE, BLOOD (ROUTINE X 2)
Culture: NO GROWTH
Culture: NO GROWTH

## 2011-01-21 LAB — URINE MICROSCOPIC-ADD ON

## 2011-01-21 LAB — HEMOGLOBIN A1C
Hgb A1c MFr Bld: 6.1
Mean Plasma Glucose: 140

## 2011-01-21 LAB — DIGOXIN LEVEL: Digoxin Level: 0.8

## 2011-01-21 LAB — IRON AND TIBC
Iron: 45
TIBC: 321

## 2011-01-21 LAB — FERRITIN: Ferritin: 81 (ref 10–291)

## 2011-01-27 ENCOUNTER — Ambulatory Visit (INDEPENDENT_AMBULATORY_CARE_PROVIDER_SITE_OTHER): Payer: No Typology Code available for payment source | Admitting: *Deleted

## 2011-01-27 DIAGNOSIS — I059 Rheumatic mitral valve disease, unspecified: Secondary | ICD-10-CM

## 2011-01-27 DIAGNOSIS — Z952 Presence of prosthetic heart valve: Secondary | ICD-10-CM

## 2011-01-27 DIAGNOSIS — Z7901 Long term (current) use of anticoagulants: Secondary | ICD-10-CM

## 2011-02-02 ENCOUNTER — Ambulatory Visit (INDEPENDENT_AMBULATORY_CARE_PROVIDER_SITE_OTHER): Payer: No Typology Code available for payment source | Admitting: *Deleted

## 2011-02-02 ENCOUNTER — Encounter: Payer: Self-pay | Admitting: Internal Medicine

## 2011-02-02 DIAGNOSIS — I5023 Acute on chronic systolic (congestive) heart failure: Secondary | ICD-10-CM

## 2011-02-02 DIAGNOSIS — I5022 Chronic systolic (congestive) heart failure: Secondary | ICD-10-CM

## 2011-02-02 LAB — ICD DEVICE OBSERVATION
BRDY-0002RV: 40 {beats}/min
DEVICE MODEL ICD: 543269
FVT: 0
TOT-0007: 3
TOT-0008: 0
TZAT-0004FASTVT: 8
TZAT-0004SLOWVT: 8
TZAT-0012FASTVT: 200 ms
TZAT-0013FASTVT: 1
TZAT-0018SLOWVT: NEGATIVE
TZON-0003FASTVT: 285 ms
TZON-0003SLOWVT: 350 ms
TZON-0010SLOWVT: 80 ms
TZST-0001FASTVT: 2
TZST-0001FASTVT: 3
TZST-0001SLOWVT: 3
TZST-0001SLOWVT: 5
TZST-0003FASTVT: 36 J
TZST-0003FASTVT: 36 J
TZST-0003FASTVT: 36 J
TZST-0003SLOWVT: 15 J
TZST-0003SLOWVT: 36 J
VENTRICULAR PACING ICD: 0.06 pct
VF: 0

## 2011-02-02 NOTE — Progress Notes (Signed)
ICD check 

## 2011-02-24 ENCOUNTER — Ambulatory Visit (INDEPENDENT_AMBULATORY_CARE_PROVIDER_SITE_OTHER): Payer: No Typology Code available for payment source | Admitting: *Deleted

## 2011-02-24 DIAGNOSIS — I059 Rheumatic mitral valve disease, unspecified: Secondary | ICD-10-CM

## 2011-02-24 DIAGNOSIS — Z7901 Long term (current) use of anticoagulants: Secondary | ICD-10-CM

## 2011-02-24 DIAGNOSIS — Z952 Presence of prosthetic heart valve: Secondary | ICD-10-CM

## 2011-03-10 ENCOUNTER — Ambulatory Visit (INDEPENDENT_AMBULATORY_CARE_PROVIDER_SITE_OTHER): Payer: No Typology Code available for payment source | Admitting: *Deleted

## 2011-03-10 DIAGNOSIS — Z7901 Long term (current) use of anticoagulants: Secondary | ICD-10-CM

## 2011-03-10 DIAGNOSIS — I059 Rheumatic mitral valve disease, unspecified: Secondary | ICD-10-CM

## 2011-03-10 DIAGNOSIS — Z954 Presence of other heart-valve replacement: Secondary | ICD-10-CM

## 2011-03-10 DIAGNOSIS — Z952 Presence of prosthetic heart valve: Secondary | ICD-10-CM

## 2011-03-10 MED ORDER — WARFARIN SODIUM 5 MG PO TABS: 5.0000 mg | ORAL_TABLET | ORAL | Status: DC

## 2011-03-24 ENCOUNTER — Ambulatory Visit (INDEPENDENT_AMBULATORY_CARE_PROVIDER_SITE_OTHER): Payer: No Typology Code available for payment source | Admitting: *Deleted

## 2011-03-24 DIAGNOSIS — Z7901 Long term (current) use of anticoagulants: Secondary | ICD-10-CM

## 2011-03-24 DIAGNOSIS — Z952 Presence of prosthetic heart valve: Secondary | ICD-10-CM

## 2011-03-24 DIAGNOSIS — I059 Rheumatic mitral valve disease, unspecified: Secondary | ICD-10-CM

## 2011-04-03 ENCOUNTER — Other Ambulatory Visit: Payer: Self-pay | Admitting: Internal Medicine

## 2011-04-07 ENCOUNTER — Ambulatory Visit (INDEPENDENT_AMBULATORY_CARE_PROVIDER_SITE_OTHER): Payer: No Typology Code available for payment source | Admitting: *Deleted

## 2011-04-07 DIAGNOSIS — I059 Rheumatic mitral valve disease, unspecified: Secondary | ICD-10-CM

## 2011-04-07 DIAGNOSIS — Z7901 Long term (current) use of anticoagulants: Secondary | ICD-10-CM

## 2011-04-07 DIAGNOSIS — Z952 Presence of prosthetic heart valve: Secondary | ICD-10-CM

## 2011-04-07 LAB — POCT INR: INR: 2.9

## 2011-04-21 ENCOUNTER — Ambulatory Visit (INDEPENDENT_AMBULATORY_CARE_PROVIDER_SITE_OTHER): Payer: Medicare Other | Admitting: *Deleted

## 2011-04-21 DIAGNOSIS — Z952 Presence of prosthetic heart valve: Secondary | ICD-10-CM

## 2011-04-21 DIAGNOSIS — Z7901 Long term (current) use of anticoagulants: Secondary | ICD-10-CM

## 2011-04-21 DIAGNOSIS — I059 Rheumatic mitral valve disease, unspecified: Secondary | ICD-10-CM

## 2011-04-21 LAB — POCT INR: INR: 2.3

## 2011-05-05 ENCOUNTER — Encounter: Payer: Self-pay | Admitting: Internal Medicine

## 2011-05-05 ENCOUNTER — Encounter: Payer: No Typology Code available for payment source | Admitting: *Deleted

## 2011-05-05 ENCOUNTER — Ambulatory Visit (INDEPENDENT_AMBULATORY_CARE_PROVIDER_SITE_OTHER): Payer: Medicare Other | Admitting: *Deleted

## 2011-05-05 DIAGNOSIS — Z954 Presence of other heart-valve replacement: Secondary | ICD-10-CM

## 2011-05-05 DIAGNOSIS — I5022 Chronic systolic (congestive) heart failure: Secondary | ICD-10-CM

## 2011-05-05 DIAGNOSIS — Z7901 Long term (current) use of anticoagulants: Secondary | ICD-10-CM

## 2011-05-05 DIAGNOSIS — Z952 Presence of prosthetic heart valve: Secondary | ICD-10-CM

## 2011-05-05 DIAGNOSIS — I059 Rheumatic mitral valve disease, unspecified: Secondary | ICD-10-CM

## 2011-05-05 LAB — ICD DEVICE OBSERVATION
BRDY-0002RV: 40 {beats}/min
DEVICE MODEL ICD: 543269
FVT: 0
TOT-0007: 3
TZAT-0012SLOWVT: 200 ms
TZAT-0013FASTVT: 1
TZAT-0013SLOWVT: 3
TZAT-0018SLOWVT: NEGATIVE
TZAT-0019SLOWVT: 7.5 V
TZAT-0020SLOWVT: 1 ms
TZON-0004SLOWVT: 30
TZON-0005SLOWVT: 6
TZST-0001FASTVT: 2
TZST-0001FASTVT: 4
TZST-0001SLOWVT: 4
TZST-0003FASTVT: 25 J
TZST-0003FASTVT: 36 J
TZST-0003SLOWVT: 25 J
TZST-0003SLOWVT: 36 J
VENTRICULAR PACING ICD: 0.05 pct
VF: 0

## 2011-05-05 NOTE — Progress Notes (Signed)
ICD check 

## 2011-05-10 ENCOUNTER — Emergency Department (HOSPITAL_COMMUNITY): Payer: Medicare Other

## 2011-05-10 ENCOUNTER — Emergency Department (HOSPITAL_COMMUNITY)
Admission: EM | Admit: 2011-05-10 | Discharge: 2011-05-10 | Disposition: A | Discharge status: Home / Self Care | Payer: Medicare Other | Attending: Emergency Medicine | Admitting: Emergency Medicine

## 2011-05-10 ENCOUNTER — Encounter (HOSPITAL_COMMUNITY): Payer: Self-pay | Admitting: *Deleted

## 2011-05-10 ENCOUNTER — Other Ambulatory Visit: Payer: Self-pay

## 2011-05-10 DIAGNOSIS — R10817 Generalized abdominal tenderness: Secondary | ICD-10-CM | POA: Insufficient documentation

## 2011-05-10 DIAGNOSIS — I10 Essential (primary) hypertension: Secondary | ICD-10-CM | POA: Insufficient documentation

## 2011-05-10 DIAGNOSIS — R112 Nausea with vomiting, unspecified: Secondary | ICD-10-CM | POA: Insufficient documentation

## 2011-05-10 DIAGNOSIS — B9789 Other viral agents as the cause of diseases classified elsewhere: Secondary | ICD-10-CM | POA: Insufficient documentation

## 2011-05-10 DIAGNOSIS — R059 Cough, unspecified: Secondary | ICD-10-CM | POA: Insufficient documentation

## 2011-05-10 DIAGNOSIS — I509 Heart failure, unspecified: Secondary | ICD-10-CM | POA: Insufficient documentation

## 2011-05-10 DIAGNOSIS — R197 Diarrhea, unspecified: Secondary | ICD-10-CM | POA: Insufficient documentation

## 2011-05-10 DIAGNOSIS — R05 Cough: Secondary | ICD-10-CM | POA: Insufficient documentation

## 2011-05-10 DIAGNOSIS — IMO0001 Reserved for inherently not codable concepts without codable children: Secondary | ICD-10-CM | POA: Insufficient documentation

## 2011-05-10 DIAGNOSIS — B349 Viral infection, unspecified: Secondary | ICD-10-CM

## 2011-05-10 DIAGNOSIS — Z95810 Presence of automatic (implantable) cardiac defibrillator: Secondary | ICD-10-CM | POA: Insufficient documentation

## 2011-05-10 DIAGNOSIS — E119 Type 2 diabetes mellitus without complications: Secondary | ICD-10-CM | POA: Insufficient documentation

## 2011-05-10 DIAGNOSIS — Z954 Presence of other heart-valve replacement: Secondary | ICD-10-CM | POA: Insufficient documentation

## 2011-05-10 LAB — URINALYSIS, ROUTINE W REFLEX MICROSCOPIC
Bilirubin Urine: NEGATIVE
Hgb urine dipstick: NEGATIVE
Protein, ur: 100 mg/dL — AB
Urobilinogen, UA: 1 mg/dL (ref 0.0–1.0)

## 2011-05-10 LAB — PROTIME-INR
INR: 2.46 — ABNORMAL HIGH (ref 0.00–1.49)
Prothrombin Time: 27.1 seconds — ABNORMAL HIGH (ref 11.6–15.2)

## 2011-05-10 LAB — DIFFERENTIAL
Basophils Relative: 0 % (ref 0–1)
Eosinophils Absolute: 0.2 10*3/uL (ref 0.0–0.7)
Lymphs Abs: 1.4 10*3/uL (ref 0.7–4.0)
Neutrophils Relative %: 75 % (ref 43–77)

## 2011-05-10 LAB — POCT I-STAT TROPONIN I: Troponin i, poc: 0 ng/mL (ref 0.00–0.08)

## 2011-05-10 LAB — CBC
MCH: 30.5 pg (ref 26.0–34.0)
Platelets: 263 10*3/uL (ref 150–400)
RBC: 4.13 MIL/uL (ref 3.87–5.11)
WBC: 10.3 10*3/uL (ref 4.0–10.5)

## 2011-05-10 LAB — POCT I-STAT, CHEM 8
BUN: 15 mg/dL (ref 6–23)
Calcium, Ion: 1.22 mmol/L (ref 1.12–1.32)
Creatinine, Ser: 0.9 mg/dL (ref 0.50–1.10)
TCO2: 28 mmol/L (ref 0–100)

## 2011-05-10 LAB — URINE MICROSCOPIC-ADD ON

## 2011-05-10 MED ORDER — ONDANSETRON HCL 4 MG PO TABS: 4.0000 mg | ORAL_TABLET | Freq: Four times a day (QID) | ORAL | Status: AC

## 2011-05-10 MED ORDER — SODIUM CHLORIDE 0.9 % IV BOLUS (SEPSIS)
500.0000 mL | INTRAVENOUS | Status: AC
  Administered 2011-05-10: 500 mL via INTRAVENOUS

## 2011-05-10 MED ORDER — OSELTAMIVIR PHOSPHATE 75 MG PO CAPS: 75.0000 mg | ORAL_CAPSULE | Freq: Two times a day (BID) | ORAL | Status: AC

## 2011-05-10 NOTE — Discharge Instructions (Signed)
Take medications as prescribed. Followup with your doctor in regards to your hospital visit. If you do not have a doctor use the resource guide listed below to help he find one. You may return to the emergency department if symptoms worsen, become progressive, or become more concerning. Read below to learn more about your diagnosis & reasons to return.  It is very important for you to understand the instructions below and be sure to return to the emergency department if your abdominal pain localizes because this could be a sign of a medical emergency.  HOME CARE INSTRUCTIONS  Do not take laxatives unless directed by your caregiver. Rest as much as possible.  Do not eat solid food until your pain is gone: A diet of water, weak decaffeinated tea, broth or bouillon, gelatin, oral rehydration solutions (ORS), frozen ice pops, or ice chips may be helpful.  When pain is gone: Start a light diet (dry toast, crackers, applesauce, or white rice). Increase the diet slowly as long as it does not bother you. Eat no dairy products (including cheese and eggs) and no spicy, fatty, fried, or high-fiber foods.  Use no alcohol, caffeine, or cigarettes.  Take your regular medicines unless your caregiver told you not to.  Take any prescribed medicine as directed.   SEEK IMMEDIATE MEDICAL CARE IF:  The pain does not go away.  You have a fever >101 that persists You keep throwing up (vomiting) or cannot drink liquids.  The pain becomes localized (Pain in the right side could possibly be appendicitis. In an adult, pain in the left lower portion of the abdomen could be colitis or diverticulitis). You pass bloody or black tarry stools.  You have shaking chills.  There is blood in your vomit or you see blood in your bowel movements.  Your bowel movements stop (become blocked) or you cannot pass gas.  You have bloody, frequent, or painful urination.  You have yellow discoloration in the skin or whites of the eyes.  Your  stomach becomes bloated or bigger.  You have dizziness or fainting.  You have chest or back pain.     Influenza, Adult  Influenza (flu)  starts suddenly, usually with a fever. It causes chills, dry and hacking cough, headache, body aches, and sore throat. Influenza spreads easily from one person to another.  HOME CARE  Only take medicines as told by your doctor.  Rest.  Drink enough fluids to keep your pee (urine) clear or pale yellow.  Wash your hands often. Do this after you blow your nose, after you go to the bathroom, and before you touch food.  GET HELP RIGHT AWAY IF:  You have shortness of breath while resting.  You have pain or pressure in the chest or belly (abdomen).  You suddenly feel dizzy.  You feel confused.  You have a hard time breathing.  Your skin or nails turn bluish in color.  You get a bad neck pain or stiffness.  You get a bad headache, face pain, or earache.  You throw up (vomit) a lot and often.  You have a fever > 101 that persists  MAKE SURE YOU:  Understand these instructions.  Will watch your condition.  Will get help right away if you are not doing well or get worse.    RESOURCE GUIDE  Dental Problems  Patients with Medicaid: [facility]  [facility] [address]                                           [phone] [address] [employer] [address] Phone:  [phone]                                                  Phone:  [phone]  If unable to pay or uninsured, contact:  [facility] or [facility] to become qualified for the adult dental clinic.  Chronic Pain Problems Contact [facility]  [phone] Patients need to be referred by their primary care doctor.  Insufficient Money for Medicine Contact [employer]:  call "211" or [facility] 848 250 7461.  No Primary Care Doctor Call [facility]  256-044-8054 Other agencies that provide inexpensive medical care    [facility]  2246599915    [facility]  [phone]    [facility]  [phone]    [facility]  [phone]    [facility]  478-243-1662    [facility]  [phone]  Psychological Services [facility]  [phone] [facility] Services  [phone] [facility]   [phone] (emergency services (276)[phone])  Substance Abuse Resources [facility]  332-546-0701 [facility] [phone] [facility] ([phone] Floydene Flock ([phone])153-4001 Residential & Outpatient Substance Abuse Program  (657)563-3636  Abuse/Neglect [facility] Child Abuse Hotline 6040450377 [facility] Child Abuse Hotline 410-638-6879 (After Hours)  Emergency Shelter [facility] (571)056-5958  Maternity Homes [facility] at [facility] [place] [facility] [place] 5392417373 [facility] 3236311416  MRSA Hotline #:   (864)194-9384    [facility] Resources  [facility]     [employer]                          [facility] [address] [place]                       [address]      [address] [place] [address]  Blondell Reveal Phone:  [phone]                                   Phone:  [phone]                 Phone:  [phone]  [facility] Mental Health Phone:  [phone]  [facility] Child Abuse Hotline 862-308-3011 901-541-1930 (After Hours)

## 2011-05-10 NOTE — ED Notes (Signed)
Pt states "I've been throwing up since Sunday, had diarrhea last night & my stomach was hurting"

## 2011-05-10 NOTE — ED Notes (Signed)
Pt is alert and oriented denys pain DC home with friend

## 2011-05-10 NOTE — ED Provider Notes (Signed)
History     CSN: [id number]  Arrival date & time 05/10/11  1238   First MD Initiated Contact with Patient 05/10/11 1339      Chief Complaint  Patient presents with  . Emesis    (Consider location/radiation/quality/duration/timing/severity/associated sxs/prior treatment) HPI Comments: Patient was sent to the emergency department with a hx significant for CHF, DM, defibrillator & valve placement with the chief complaint of flulike symptoms.  Patient states it all began Sunday evening with a sore throat.  Patient later developed a dry cough and last night developed nausea, vomiting, and diarrhea.  Patient states her cough is nonproductive and she feels as if she is febrile with night sweats and chills. Pt states she has been having diffuse abdominal pain since last evening. Denies hemoptysis, hematemeses, hematochezia, melena, and urinary s/s including frequency urgency & hematuria. Pt is currently on chronic coumadin therapy.   Patient is a 66 y.o. female presenting with vomiting. The history is provided by the patient.  Emesis  Associated symptoms include abdominal pain, chills, cough, diarrhea, a fever and myalgias. Pertinent negatives include no arthralgias and no headaches.    Past Medical History  Diagnosis Date  . CHF (congestive heart failure)     --Non-ischemic CM EF 30-35% by echo 9/09 (Previous 25%)  --s/p St. Jude single chamber ICD 04/09 --Minimal  CAD by cath 2004 LAD 20% LCX 20% Ramus 30% RCA nl --echo 05/11 EF 20-25%  . MR (congenital mitral regurgitation)     Severe MR s/p St. Jude mechanical MVR by Gasper Lloyd 2005  . Aortic stenosis, mild     mean gradient echo 09/09  . Diabetes mellitus     type 2  . Kidney stones   . Hyperlipidemia   . Hypertension     Past Surgical History  Procedure Date  . Appendectomy 1979  . Cardiac defibrillator placement 2009  . Insert / replace / remove pacemaker   . Cardiac valve surgery 2005    Family History  Problem  Relation Age of Onset  . Heart disease Mother     History  Substance Use Topics  . Smoking status: Former Smoker    Quit date: 06/16/1984  . Smokeless tobacco: Never Used  . Alcohol Use: No    OB History    Grav Para Term Preterm Abortions TAB SAB Ect Mult Living                  Review of Systems  Constitutional: Positive for fever, chills, activity change and appetite change.  HENT: Positive for sore throat. Negative for ear pain, congestion, rhinorrhea, neck stiffness and dental problem.   Eyes: Negative for visual disturbance.  Respiratory: Positive for cough. Negative for chest tightness, shortness of breath and wheezing.   Cardiovascular: Negative for chest pain and leg swelling.  Gastrointestinal: Positive for nausea, vomiting, abdominal pain and diarrhea. Negative for constipation, blood in stool, abdominal distention, anal bleeding and rectal pain.  Genitourinary: Negative for dysuria, urgency, frequency, hematuria and flank pain.  Musculoskeletal: Positive for myalgias. Negative for back pain, arthralgias and gait problem.  Skin: Negative for rash.  Neurological: Negative for dizziness, syncope, speech difficulty, weakness, light-headedness, numbness and headaches.  Hematological: Does not bruise/bleed easily.  Psychiatric/Behavioral: Negative for confusion.  All other systems reviewed and are negative.    Allergies  Review of patient's allergies indicates no known allergies.  Home Medications   Current Outpatient Rx  Name Route Sig Dispense Refill  . ATORVASTATIN CALCIUM  40 MG PO TABS Oral Take 40 mg by mouth daily.      Marland Kitchen CARVEDILOL 25 MG PO TABS Oral Take 25 mg by mouth 2 (two) times daily with a meal.      . CHOLINE FENOFIBRATE 135 MG PO CPDR Oral Take 135 mg by mouth daily.      Marland Kitchen DIGOXIN 0.125 MG PO TABS Oral Take 125 mcg by mouth daily.      . OMEGA-3 FATTY ACIDS 1000 MG PO CAPS Oral Take by mouth daily.     Marland Kitchen HYDROCODONE-ACETAMINOPHEN 5-500 MG PO TABS  Oral Take 1 tablet by mouth as needed.      Marland Kitchen METFORMIN HCL 1000 MG PO TABS Oral Take 1,000 mg by mouth 2 (two) times daily with a meal.      . QUINAPRIL HCL 40 MG PO TABS Oral Take 40 mg by mouth daily.      Marland Kitchen SPIRONOLACTONE 25 MG PO TABS  TAKE 1 TABLET (25 MG TOTAL) BY MOUTH DAILY. 30 tablet 6  . WARFARIN SODIUM 5 MG PO TABS Oral Take 5 mg by mouth as directed. Sunday Monday AND WEDNESDAY TAKES 5 MG.Jake Shark Thursday AND SATURDAY TAKES 7.5 MG      BP 120/80  Pulse 102  Temp(Src) 98 F (36.7 C) (Oral)  Resp 20  Wt 170 lb (77.111 kg)  SpO2 98%  Physical Exam  Nursing note and vitals reviewed. Constitutional: Vital signs are normal. She appears well-developed and well-nourished. No distress.  HENT:  Head: Normocephalic and atraumatic.  Mouth/Throat: Uvula is midline, oropharynx is clear and moist and mucous membranes are normal.  Eyes: Conjunctivae and EOM are normal. Pupils are equal, round, and reactive to light.  Neck: Normal range of motion and full passive range of motion without pain. Neck supple. No spinous process tenderness and no muscular tenderness present. No rigidity. No Brudzinski's sign noted.  Cardiovascular: Normal rate and regular rhythm.        Artificial valve auscultated, defibrillator placed and palpable. Intact distal pulses. No edema bilaterally   Pulmonary/Chest: Effort normal and breath sounds normal. No accessory muscle usage. Not tachypneic. No respiratory distress.  Abdominal: Soft. Normal appearance, normal aorta and bowel sounds are normal. She exhibits no distension, no ascites, no pulsatile midline mass and no mass. There is generalized tenderness. There is no CVA tenderness. No hernia.       Diffuse abdominal tenderness, No localization on palpation. Bowel sounds present and normal.   Lymphadenopathy:    She has no cervical adenopathy.  Neurological: She is alert.  Skin: Skin is warm and dry. No rash noted. She is not diaphoretic.  Psychiatric: She has  a normal mood and affect. Her speech is normal and behavior is normal.    ED Course  Procedures (including critical care time)  Labs Reviewed  DIFFERENTIAL - Abnormal; Notable for the following:    Neutro Abs 7.8 (*)    All other components within normal limits  PROTIME-INR - Abnormal; Notable for the following:    Prothrombin Time 27.1 (*)    INR 2.46 (*)    All other components within normal limits  APTT - Abnormal; Notable for the following:    aPTT 45 (*)    All other components within normal limits  URINALYSIS, ROUTINE W REFLEX MICROSCOPIC - Abnormal; Notable for the following:    Ketones, ur TRACE (*)    Protein, ur 100 (*)    All other components within normal limits  POCT I-STAT,  CHEM 8 - Abnormal; Notable for the following:    Glucose, Bld 180 (*)    All other components within normal limits  URINE MICROSCOPIC-ADD ON - Abnormal; Notable for the following:    Bacteria, UA FEW (*)    All other components within normal limits  CBC  POCT I-STAT TROPONIN I  URINE CULTURE   Dg Chest 2 View  05/10/2011  *RADIOLOGY REPORT*  Clinical Data: Cough.  Nausea.  Vomiting.  CHEST - 2 VIEW  Comparison: 09/03/2010.  Findings: Median sternotomy.  Left subclavian AICD appears unchanged.  Cardiomegaly.  Pulmonary vascular congestion is present.  There is no airspace disease.  No effusion.  IMPRESSION:  1.  Cardiomegaly and pulmonary vascular congestion. 2.  Negative for pneumonia. 3.  Postoperative and postprocedural changes of the chest.  Original Report Authenticated By: Andreas Newport, M.D.     No diagnosis found.  Date: 05/10/2011  Rate: 95  Rhythm: normal sinus rhythm  QRS Axis: normal  Intervals: normal  ST/T Wave abnormalities: normal  Conduction Disutrbances:none  Narrative Interpretation:   Old EKG Reviewed: No significant changes noted      MDM  Flu like symptoms  Patient has been evaluated in the emergency department is hemodynamically stable and in no acute  distress.  Patient has been able to tolerate greater than 6 ounces PO fluid hydration as well as applesauce.  There has not been an episode of diarrhea or vomiting while in the emergency department.  Patient denied nausea and abdominal pain throughout hospital visit and did not require Zofran or pain medications during evaluation.  Patient is being discharged with diagnosis of flulike symptoms and a prescription of Tamiflu with instructions to followup with primary care.  Patient has been advised to return to the emergency department if her abdominal pain that localizes or becomes concerning in any way.  Patient's presentation is not concerning for cardiac cause due to the fact of a normal EKG in comparison to previous as well as negative troponins.  This patient was seen and discussed with Dr. Alto Denver who agrees with my plan to discharge patient on Tamiflu.      Jaci Carrel, [place] 05/10/11 1638

## 2011-05-11 ENCOUNTER — Other Ambulatory Visit (HOSPITAL_COMMUNITY): Payer: Medicare Other | Admitting: Radiology

## 2011-05-11 LAB — URINE CULTURE

## 2011-05-12 ENCOUNTER — Ambulatory Visit (HOSPITAL_COMMUNITY): Payer: Medicare Other | Attending: Cardiovascular Disease | Admitting: Radiology

## 2011-05-12 ENCOUNTER — Other Ambulatory Visit (HOSPITAL_COMMUNITY): Payer: Self-pay | Admitting: Radiology

## 2011-05-12 DIAGNOSIS — E119 Type 2 diabetes mellitus without complications: Secondary | ICD-10-CM | POA: Insufficient documentation

## 2011-05-12 DIAGNOSIS — I059 Rheumatic mitral valve disease, unspecified: Secondary | ICD-10-CM

## 2011-05-12 DIAGNOSIS — I428 Other cardiomyopathies: Secondary | ICD-10-CM | POA: Insufficient documentation

## 2011-05-12 DIAGNOSIS — I509 Heart failure, unspecified: Secondary | ICD-10-CM | POA: Insufficient documentation

## 2011-05-12 DIAGNOSIS — E785 Hyperlipidemia, unspecified: Secondary | ICD-10-CM | POA: Insufficient documentation

## 2011-05-12 NOTE — ED Provider Notes (Signed)
Medical screening examination/treatment/procedure(s) were performed by non-physician practitioner and as supervising physician I was immediately available for consultation/collaboration.  Cyndra Numbers, MD 05/12/11 2142

## 2011-05-13 ENCOUNTER — Ambulatory Visit (HOSPITAL_COMMUNITY)
Admission: RE | Admit: 2011-05-13 | Discharge: 2011-05-13 | Disposition: A | Discharge status: Home / Self Care | Payer: Medicare Other | Source: Ambulatory Visit | Attending: Internal Medicine | Admitting: Internal Medicine

## 2011-05-13 VITALS — BP 124/62 | HR 65 | Wt 177.0 lb

## 2011-05-13 DIAGNOSIS — J069 Acute upper respiratory infection, unspecified: Secondary | ICD-10-CM

## 2011-05-13 DIAGNOSIS — I5022 Chronic systolic (congestive) heart failure: Secondary | ICD-10-CM | POA: Insufficient documentation

## 2011-05-13 MED ORDER — LEVOFLOXACIN 500 MG PO TABS: 500.0000 mg | ORAL_TABLET | Freq: Every day | ORAL | Status: AC

## 2011-05-13 NOTE — Patient Instructions (Signed)
Take Levaquin 500 mg daily for 7 days  Follow up at [facility] Monday or Tuesday  Do the following things EVERYDAY: 1) Weigh yourself in the morning before breakfast. Write it down and keep it in a log. 2) Take your medicines as prescribed 3) Eat low salt foods-Limit salt (sodium) to 2000mg  per day.  4) Stay as active as you can everyday  Follow up in 3 months

## 2011-05-13 NOTE — Progress Notes (Signed)
Patient ID: Deborah [name], female   DOB: July 04, 1945, 66 y.o.   MRN: [id number] HPI:  Deborah [name] is a very pleasant 66 year old woman with a history of congestive heart failure, secondary to nonischemic cardiomyopathy, ejection fraction around 25% s/p St Jude single chamber ICD.  She also has a history of mitral valve disease and status post mitral valve replacement with a St. Jude valve in 2005.  Remainder of her history is notable for diabetes, hypertension, hyperlipidemia, and kidney stones.   CPX performed June 2011 pVO2 16.8 slope 26.1 RER 1.11 O2 pulse 90% predicted O2 sat 87% at peak with possible hypoventilation.  05/12/11 ECHO 20-25%  Returns today for follow up. She was just seen by ED on Wednesday due to fever and chills. She was given Tamiflu however she did not get the prescription filled. CXR - for pneumonia.  She has had productive cough thick white sputum for over one week.    She does not weigh at home. Denies lower extremity edema.  Denies CP or SOB. No edema. Compliant with all meds. No bleeding with coumadin.   ROS: All systems negative except as listed in HPI, PMH and Problem List.  Past Medical History  Diagnosis Date  . CHF (congestive heart failure)     --Non-ischemic CM EF 30-35% by echo 9/09 (Previous 25%)  --s/p St. Jude single chamber ICD 04/09 --Minimal  CAD by cath 2004 LAD 20% LCX 20% Ramus 30% RCA nl --echo 05/11 EF 20-25%  . MR (congenital mitral regurgitation)     Severe MR s/p St. Jude mechanical MVR by Gasper Lloyd 2005  . Aortic stenosis, mild     mean gradient echo 09/09  . Diabetes mellitus     type 2  . Kidney stones   . Hyperlipidemia   . Hypertension     Current Outpatient Prescriptions  Medication Sig Dispense Refill  . atorvastatin (LIPITOR) 40 MG tablet Take 40 mg by mouth daily.        . carvedilol (COREG) 25 MG tablet Take 25 mg by mouth 2 (two) times daily with a meal.        . Choline Fenofibrate (TRILIPIX) 135 MG capsule Take 135 mg by  mouth daily.        . digoxin (LANOXIN) 0.125 MG tablet Take 125 mcg by mouth daily.        . fish oil-omega-3 fatty acids 1000 MG capsule Take by mouth daily.       Marland Kitchen HYDROcodone-acetaminophen (VICODIN) 5-500 MG per tablet Take 1 tablet by mouth as needed.        . metFORMIN (GLUCOPHAGE) 1000 MG tablet Take 1,000 mg by mouth 2 (two) times daily with a meal.        . ondansetron (ZOFRAN) 4 MG tablet Take 1 tablet (4 mg total) by mouth every 6 (six) hours.  12 tablet  0  . quinapril (ACCUPRIL) 40 MG tablet Take 40 mg by mouth daily.        Marland Kitchen spironolactone (ALDACTONE) 25 MG tablet TAKE 1 TABLET (25 MG TOTAL) BY MOUTH DAILY.  30 tablet  6  . warfarin (COUMADIN) 5 MG tablet Take 5 mg by mouth as directed. Sunday Monday AND WEDNESDAY TAKES 5 MG.Jake Shark Thursday AND SATURDAY TAKES 7.5 MG      . oseltamivir (TAMIFLU) 75 MG capsule Take 1 capsule (75 mg total) by mouth every 12 (twelve) hours.  10 capsule  0     PHYSICAL EXAM: Filed Vitals:  05/13/11 1117  BP: 124/62  Pulse: 65   General:    Well appearing no distress HEENT Normal Neck: JVP 5-6 + prominent cv waves carotids were brisk. no bruits Lungs LUL LLL RLL Rhonchi PMI laterally displaced Heart sounds were regular with mechanical S1 with 2/6 systolic murmur at RSB.  Abdomen was soft with active bowel sounds. There is no clubbing cyanosis.  Skin Warm and dry Neuro  oriented x 3  cranial nerves II-XII grossly intact. moves all 4 extremities without difficulty.  ECG:    ASSESSMENT & PLAN:

## 2011-05-13 NOTE — Assessment & Plan Note (Signed)
Persistent upper respiratory infection for >1 week. Lungs with Rhonchi RLL RML and LLL. Will start Levaquin 500 mg daily for seven days. If she does not improve by Monday she is instructed to follow up Monday with her primary care physician.

## 2011-05-13 NOTE — Assessment & Plan Note (Addendum)
Volume status stable. EF remains 20-25%. Continue current regimen. Follow up in 3 months  Patient seen and examined with Tonye Becket, NP.Echo reviewed personally.  We discussed all aspects of the encounter. I agree with the assessment and plan as stated above. Despite, persistent severe LV dysfunction seems to be doing OK with NYHA II symptoms. Volume status looks good. Will continue current regimen. Will give course of antibiotics for persistent URI. Will need f/u with PCP if not improving soon.

## 2011-05-13 NOTE — Assessment & Plan Note (Signed)
SContinue coumadin. She will follow up February 5 , 2013 at 12:15 due to antibiotic initiation.

## 2011-05-17 ENCOUNTER — Ambulatory Visit (INDEPENDENT_AMBULATORY_CARE_PROVIDER_SITE_OTHER): Payer: Medicare Other | Admitting: *Deleted

## 2011-05-17 DIAGNOSIS — Z954 Presence of other heart-valve replacement: Secondary | ICD-10-CM

## 2011-05-17 DIAGNOSIS — Z7901 Long term (current) use of anticoagulants: Secondary | ICD-10-CM

## 2011-05-17 DIAGNOSIS — Z952 Presence of prosthetic heart valve: Secondary | ICD-10-CM

## 2011-05-17 DIAGNOSIS — I059 Rheumatic mitral valve disease, unspecified: Secondary | ICD-10-CM

## 2011-06-14 ENCOUNTER — Ambulatory Visit (INDEPENDENT_AMBULATORY_CARE_PROVIDER_SITE_OTHER): Payer: Medicare Other | Admitting: *Deleted

## 2011-06-14 DIAGNOSIS — I059 Rheumatic mitral valve disease, unspecified: Secondary | ICD-10-CM

## 2011-06-14 DIAGNOSIS — Z952 Presence of prosthetic heart valve: Secondary | ICD-10-CM

## 2011-06-14 DIAGNOSIS — Z954 Presence of other heart-valve replacement: Secondary | ICD-10-CM

## 2011-06-14 DIAGNOSIS — Z7901 Long term (current) use of anticoagulants: Secondary | ICD-10-CM

## 2011-06-28 ENCOUNTER — Ambulatory Visit (INDEPENDENT_AMBULATORY_CARE_PROVIDER_SITE_OTHER): Payer: Medicare Other | Admitting: *Deleted

## 2011-06-28 DIAGNOSIS — Z952 Presence of prosthetic heart valve: Secondary | ICD-10-CM

## 2011-06-28 DIAGNOSIS — Z7901 Long term (current) use of anticoagulants: Secondary | ICD-10-CM

## 2011-06-28 DIAGNOSIS — Z954 Presence of other heart-valve replacement: Secondary | ICD-10-CM

## 2011-06-28 DIAGNOSIS — I059 Rheumatic mitral valve disease, unspecified: Secondary | ICD-10-CM

## 2011-06-28 LAB — POCT INR: INR: 3.2

## 2011-07-19 ENCOUNTER — Ambulatory Visit (INDEPENDENT_AMBULATORY_CARE_PROVIDER_SITE_OTHER): Payer: Medicare Other | Admitting: Pharmacist

## 2011-07-19 DIAGNOSIS — I059 Rheumatic mitral valve disease, unspecified: Secondary | ICD-10-CM

## 2011-07-19 DIAGNOSIS — Z7901 Long term (current) use of anticoagulants: Secondary | ICD-10-CM

## 2011-07-19 DIAGNOSIS — Z952 Presence of prosthetic heart valve: Secondary | ICD-10-CM

## 2011-07-19 DIAGNOSIS — Z954 Presence of other heart-valve replacement: Secondary | ICD-10-CM

## 2011-08-03 ENCOUNTER — Encounter: Payer: Self-pay | Admitting: Internal Medicine

## 2011-08-03 ENCOUNTER — Ambulatory Visit (INDEPENDENT_AMBULATORY_CARE_PROVIDER_SITE_OTHER): Payer: Medicare Other | Admitting: Internal Medicine

## 2011-08-03 ENCOUNTER — Ambulatory Visit (INDEPENDENT_AMBULATORY_CARE_PROVIDER_SITE_OTHER): Payer: Medicare Other | Admitting: Pharmacist

## 2011-08-03 VITALS — BP 134/83 | HR 67 | Ht 63.0 in | Wt 174.0 lb

## 2011-08-03 DIAGNOSIS — Z954 Presence of other heart-valve replacement: Secondary | ICD-10-CM

## 2011-08-03 DIAGNOSIS — Z95810 Presence of automatic (implantable) cardiac defibrillator: Secondary | ICD-10-CM

## 2011-08-03 DIAGNOSIS — I059 Rheumatic mitral valve disease, unspecified: Secondary | ICD-10-CM

## 2011-08-03 DIAGNOSIS — Z7901 Long term (current) use of anticoagulants: Secondary | ICD-10-CM

## 2011-08-03 DIAGNOSIS — Z952 Presence of prosthetic heart valve: Secondary | ICD-10-CM

## 2011-08-03 DIAGNOSIS — I5023 Acute on chronic systolic (congestive) heart failure: Secondary | ICD-10-CM

## 2011-08-03 DIAGNOSIS — I5022 Chronic systolic (congestive) heart failure: Secondary | ICD-10-CM

## 2011-08-03 LAB — ICD DEVICE OBSERVATION
BRDY-0002RV: 40 {beats}/min
DEV-0020ICD: NEGATIVE
DEVICE MODEL ICD: 543269
RV LEAD IMPEDENCE ICD: 450 Ohm
TZAT-0004FASTVT: 8
TZAT-0004SLOWVT: 8
TZAT-0012FASTVT: 200 ms
TZAT-0013FASTVT: 1
TZAT-0013SLOWVT: 3
TZAT-0018SLOWVT: NEGATIVE
TZON-0003SLOWVT: 350 ms
TZON-0005FASTVT: 6
TZST-0001FASTVT: 3
TZST-0001FASTVT: 5
TZST-0001SLOWVT: 2
TZST-0001SLOWVT: 4
TZST-0003FASTVT: 25 J
TZST-0003FASTVT: 36 J
TZST-0003FASTVT: 36 J
TZST-0003SLOWVT: 36 J
VENTRICULAR PACING ICD: 1 pct

## 2011-08-03 NOTE — Assessment & Plan Note (Signed)
The patient's device was interrogated.  The information was reviewed. No changes were made in the programming.    

## 2011-08-03 NOTE — Assessment & Plan Note (Signed)
Relatively stable. There is evidence of right-sided volume overload but no peripheral edema. Exercise tolerance is okay. She is to see Dr. Dorthea Cove in 2 weeks.

## 2011-08-03 NOTE — Progress Notes (Signed)
  HPI  Deborah [name] is a 66 y.o. female very pleasant 66 year old woman with a history of congestive heart failure, secondary to nonischemic cardiomyopathy Echo January 2013 ejection fraction around 25% s/p St Jude single chamber ICD.  She also has a history of mitral valve disease and status post mitral valve replacement with a St. Jude valve in 2005  She denies significant shortness of breath.   she has no edema. SHe is able to walk a number of blocks at [employer] as recently as a couple of weeks ago. She sleeps in a recliner because her shoulders hurt when she lies flat.     Past Medical History  Diagnosis Date  . CHF (congestive heart failure)     --Non-ischemic CM EF 30-35% by echo 9/09 (Previous 25%)  --s/p St. Jude single chamber ICD 04/09 --Minimal  CAD by cath 2004 LAD 20% LCX 20% Ramus 30% RCA nl --echo 05/11 EF 20-25%  . MR (congenital mitral regurgitation)     Severe MR s/p St. Jude mechanical MVR by Gasper Lloyd 2005  . Aortic stenosis, mild     mean gradient echo 09/09  . Diabetes mellitus     type 2  . Kidney stones   . Hyperlipidemia   . Hypertension     Past Surgical History  Procedure Date  . Appendectomy 1979  . Cardiac defibrillator placement 2009  . Insert / replace / remove pacemaker   . Cardiac valve surgery 2005    Current Outpatient Prescriptions  Medication Sig Dispense Refill  . atorvastatin (LIPITOR) 40 MG tablet Take 40 mg by mouth daily.        . carvedilol (COREG) 25 MG tablet Take 25 mg by mouth 2 (two) times daily with a meal.        . Choline Fenofibrate (TRILIPIX) 135 MG capsule Take 135 mg by mouth daily.        . digoxin (LANOXIN) 0.125 MG tablet Take 125 mcg by mouth daily.        . fish oil-omega-3 fatty acids 1000 MG capsule Take by mouth daily.       Marland Kitchen HYDROcodone-acetaminophen (VICODIN) 5-500 MG per tablet Take 1 tablet by mouth as needed.        . metFORMIN (GLUCOPHAGE) 1000 MG tablet Take 1,000 mg by mouth 2 (two) times  daily with a meal.        . quinapril (ACCUPRIL) 40 MG tablet Take 40 mg by mouth daily.        Marland Kitchen spironolactone (ALDACTONE) 25 MG tablet TAKE 1 TABLET (25 MG TOTAL) BY MOUTH DAILY.  30 tablet  6  . warfarin (COUMADIN) 5 MG tablet Take 5 mg by mouth as directed. Sunday Monday AND WEDNESDAY TAKES 5 MG.Jake Shark Thursday AND SATURDAY TAKES 7.5 MG        No Known Allergies  Review of Systems negative except from HPI and PMH  Physical Exam BP 134/83  Pulse 67  Ht 5\' 3"  (1.6 m)  Wt 174 lb (78.926 kg)  BMI 30.82 kg/m2 Well developed and well nourished in no acute distress HENT normal E scleral and icterus clear Neck Supple JVP 9-10; carotids brisk and full Clear to ausculation  Regular rate and rhythm,  mechanical S1 and a 2/6 systolic murmur  Soft with active bowel sounds No clubbing cyanosis none Edema Alert and oriented, grossly normal motor and sensory function Skin Warm and Dry   Assessment and  Plan

## 2011-08-03 NOTE — Patient Instructions (Addendum)
Your physician recommends that you schedule a follow-up appointment in: 3 months with device clinic.   Your physician wants you to follow-up in: 1 year with Dr. Klein. You will receive a reminder letter in the mail two months in advance. If you don't receive a letter, please call our office to schedule the follow-up appointment.  Your physician recommends that you continue on your current medications as directed. Please refer to the Current Medication list given to you today.   

## 2011-08-07 ENCOUNTER — Other Ambulatory Visit: Payer: Self-pay | Admitting: Cardiovascular Disease

## 2011-08-16 ENCOUNTER — Ambulatory Visit (HOSPITAL_COMMUNITY)
Admission: RE | Admit: 2011-08-16 | Discharge: 2011-08-16 | Disposition: A | Discharge status: Home / Self Care | Payer: Medicare Other | Source: Ambulatory Visit | Attending: Internal Medicine | Admitting: Internal Medicine

## 2011-08-16 ENCOUNTER — Encounter (HOSPITAL_COMMUNITY): Payer: Self-pay

## 2011-08-16 ENCOUNTER — Ambulatory Visit (INDEPENDENT_AMBULATORY_CARE_PROVIDER_SITE_OTHER): Payer: Medicare Other

## 2011-08-16 VITALS — BP 120/60 | HR 66 | Resp 18 | Ht 63.0 in | Wt 175.8 lb

## 2011-08-16 DIAGNOSIS — Z954 Presence of other heart-valve replacement: Secondary | ICD-10-CM | POA: Insufficient documentation

## 2011-08-16 DIAGNOSIS — I10 Essential (primary) hypertension: Secondary | ICD-10-CM | POA: Insufficient documentation

## 2011-08-16 DIAGNOSIS — Z7901 Long term (current) use of anticoagulants: Secondary | ICD-10-CM

## 2011-08-16 DIAGNOSIS — N200 Calculus of kidney: Secondary | ICD-10-CM | POA: Insufficient documentation

## 2011-08-16 DIAGNOSIS — I509 Heart failure, unspecified: Secondary | ICD-10-CM | POA: Insufficient documentation

## 2011-08-16 DIAGNOSIS — Z952 Presence of prosthetic heart valve: Secondary | ICD-10-CM

## 2011-08-16 DIAGNOSIS — E785 Hyperlipidemia, unspecified: Secondary | ICD-10-CM | POA: Insufficient documentation

## 2011-08-16 DIAGNOSIS — I428 Other cardiomyopathies: Secondary | ICD-10-CM | POA: Insufficient documentation

## 2011-08-16 DIAGNOSIS — E119 Type 2 diabetes mellitus without complications: Secondary | ICD-10-CM | POA: Insufficient documentation

## 2011-08-16 DIAGNOSIS — I059 Rheumatic mitral valve disease, unspecified: Secondary | ICD-10-CM

## 2011-08-16 DIAGNOSIS — I5022 Chronic systolic (congestive) heart failure: Secondary | ICD-10-CM | POA: Insufficient documentation

## 2011-08-16 LAB — POCT INR: INR: 4.4

## 2011-08-16 NOTE — Progress Notes (Signed)
HPI:  [name] is a very pleasant 66 year old woman with a history of congestive heart failure secondary to nonischemic cardiomyopathy, ejection fraction around 25% s/p St Jude single chamber ICD.  She also has a history of mitral valve disease and status post mitral valve replacement with a St. Jude valve in 2005.  Remainder of her history is notable for diabetes, hypertension, hyperlipidemia, and kidney stones.   CPX performed June 2011 pVO2 16.8 slope 26.1 RER 1.11 O2 pulse 90% predicted O2 sat 87% at peak with possible hypoventilation.  05/12/11 ECHO 20-25%.  Mechanical MVR appears normal.    She is for follow up today.  Feels good today.  Getting around really well.  She does not weigh at home because scale is broken.  Denies lower extremity edema.  Denies CP or SOB. No edema. Compliant with all meds. No bleeding with coumadin.  No ICD shocks.  ROS: All systems negative except as listed in HPI, PMH and Problem List.  Past Medical History  Diagnosis Date  . CHF (congestive heart failure)     --Non-ischemic CM EF 30-35% by echo 9/09 (Previous 25%)  --s/p St. Jude single chamber ICD 04/09 --Minimal  CAD by cath 2004 LAD 20% LCX 20% Ramus 30% RCA nl --echo 05/11 EF 20-25%  . MR (congenital mitral regurgitation)     Severe MR s/p St. Jude mechanical MVR by Gasper Lloyd 2005  . Aortic stenosis, mild     mean gradient echo 09/09  . Diabetes mellitus     type 2  . Kidney stones   . Hyperlipidemia   . Hypertension     Current Outpatient Prescriptions  Medication Sig Dispense Refill  . atorvastatin (LIPITOR) 40 MG tablet Take 40 mg by mouth daily.        . carvedilol (COREG) 25 MG tablet Take 25 mg by mouth 2 (two) times daily with a meal.        . Choline Fenofibrate (TRILIPIX) 135 MG capsule Take 135 mg by mouth daily.        . digoxin (LANOXIN) 0.125 MG tablet Take 125 mcg by mouth daily.        . fish oil-omega-3 fatty acids 1000 MG capsule Take by mouth daily.       Marland Kitchen  HYDROcodone-acetaminophen (VICODIN) 5-500 MG per tablet Take 1 tablet by mouth as needed.        . metFORMIN (GLUCOPHAGE) 1000 MG tablet Take 1,000 mg by mouth 2 (two) times daily with a meal.        . quinapril (ACCUPRIL) 40 MG tablet Take 40 mg by mouth daily.        Marland Kitchen spironolactone (ALDACTONE) 25 MG tablet TAKE 1 TABLET (25 MG TOTAL) BY MOUTH DAILY.  30 tablet  6  . warfarin (COUMADIN) 5 MG tablet Take 5 mg by mouth as directed. Sunday Monday AND WEDNESDAY TAKES 5 MG.Jake Shark Thursday AND SATURDAY TAKES 7.5 MG      . warfarin (COUMADIN) 5 MG tablet TAKE 1 TABLET BY MOUTH DAILY AS DIRECTED.  50 tablet  3     PHYSICAL EXAM: Filed Vitals:   08/16/11 1048  BP: 120/60  Pulse: 66  Resp: 18  Height: 5\' 3"  (1.6 m)  Weight: 175 lb 12.8 oz (79.742 kg)  SpO2: 96%   General:    Well appearing no distress HEENT Normal Neck: JVP 5-6 + prominent cv waves carotids were brisk. no bruits Lungs clear  Cor: Heart sounds were regular with mechanical  Abdomen was soft with active bowel sounds. There is no clubbing cyanosis.  Skin Warm and dry Neuro  oriented x 3  cranial nerves II-XII grossly intact. moves all 4 extremities without difficulty.   ASSESSMENT & PLAN:

## 2011-08-16 NOTE — Patient Instructions (Signed)
Continue current medications.  Follow up 6 months with echo.  Your physician has requested that you have an echocardiogram. Echocardiography is a painless test that uses sound waves to create images of your heart. It provides your doctor with information about the size and shape of your heart and how well your heart's chambers and valves are working. This procedure takes approximately one hour. There are no restrictions for this procedure.

## 2011-08-16 NOTE — Assessment & Plan Note (Addendum)
NYHA II.  Volume status looks good.  Will continue current medicaitons.  Follow up 6 months.    Patient seen and examined with Ulyess Blossom, PA-C. We discussed all aspects of the encounter. I agree with the assessment and plan as stated above. Continues to do great despite long-standing severe LV dysfunction. Continue current regimen.

## 2011-08-16 NOTE — Assessment & Plan Note (Addendum)
S/p replacement in 2005.  Appears normal by echo 04/2011.  Follow up 6 months with echo   Attending. Continue coumadin and SBE prophylaxis.

## 2011-08-30 ENCOUNTER — Ambulatory Visit (INDEPENDENT_AMBULATORY_CARE_PROVIDER_SITE_OTHER): Payer: Medicare Other | Admitting: *Deleted

## 2011-08-30 DIAGNOSIS — I059 Rheumatic mitral valve disease, unspecified: Secondary | ICD-10-CM

## 2011-08-30 DIAGNOSIS — Z954 Presence of other heart-valve replacement: Secondary | ICD-10-CM

## 2011-08-30 DIAGNOSIS — Z952 Presence of prosthetic heart valve: Secondary | ICD-10-CM

## 2011-08-30 DIAGNOSIS — Z7901 Long term (current) use of anticoagulants: Secondary | ICD-10-CM

## 2011-08-30 LAB — POCT INR: INR: 2

## 2011-09-21 ENCOUNTER — Ambulatory Visit (INDEPENDENT_AMBULATORY_CARE_PROVIDER_SITE_OTHER): Payer: Medicare Other | Admitting: *Deleted

## 2011-09-21 DIAGNOSIS — I059 Rheumatic mitral valve disease, unspecified: Secondary | ICD-10-CM

## 2011-09-21 DIAGNOSIS — Z954 Presence of other heart-valve replacement: Secondary | ICD-10-CM

## 2011-09-21 DIAGNOSIS — Z952 Presence of prosthetic heart valve: Secondary | ICD-10-CM

## 2011-09-21 DIAGNOSIS — Z7901 Long term (current) use of anticoagulants: Secondary | ICD-10-CM

## 2011-10-19 ENCOUNTER — Ambulatory Visit (INDEPENDENT_AMBULATORY_CARE_PROVIDER_SITE_OTHER): Payer: Medicare Other | Admitting: *Deleted

## 2011-10-19 DIAGNOSIS — Z7901 Long term (current) use of anticoagulants: Secondary | ICD-10-CM

## 2011-10-19 DIAGNOSIS — Z952 Presence of prosthetic heart valve: Secondary | ICD-10-CM

## 2011-10-19 DIAGNOSIS — I059 Rheumatic mitral valve disease, unspecified: Secondary | ICD-10-CM

## 2011-10-19 DIAGNOSIS — Z954 Presence of other heart-valve replacement: Secondary | ICD-10-CM

## 2011-10-21 ENCOUNTER — Encounter: Payer: Self-pay | Admitting: *Deleted

## 2011-11-03 ENCOUNTER — Ambulatory Visit (INDEPENDENT_AMBULATORY_CARE_PROVIDER_SITE_OTHER): Payer: Medicare Other | Admitting: *Deleted

## 2011-11-03 ENCOUNTER — Encounter: Payer: Self-pay | Admitting: Internal Medicine

## 2011-11-03 DIAGNOSIS — Z952 Presence of prosthetic heart valve: Secondary | ICD-10-CM

## 2011-11-03 DIAGNOSIS — I059 Rheumatic mitral valve disease, unspecified: Secondary | ICD-10-CM

## 2011-11-03 DIAGNOSIS — I428 Other cardiomyopathies: Secondary | ICD-10-CM

## 2011-11-03 DIAGNOSIS — Z954 Presence of other heart-valve replacement: Secondary | ICD-10-CM

## 2011-11-03 DIAGNOSIS — Z7901 Long term (current) use of anticoagulants: Secondary | ICD-10-CM

## 2011-11-03 LAB — ICD DEVICE OBSERVATION
CHARGE TIME: 11.9 s
DEV-0020ICD: NEGATIVE
HV IMPEDENCE: 45 Ohm
RV LEAD IMPEDENCE ICD: 490 Ohm
TZAT-0001SLOWVT: 1
TZAT-0004FASTVT: 8
TZAT-0004SLOWVT: 8
TZAT-0012SLOWVT: 200 ms
TZAT-0013FASTVT: 1
TZAT-0018FASTVT: NEGATIVE
TZAT-0019SLOWVT: 7.5 V
TZON-0003FASTVT: 285 ms
TZON-0004SLOWVT: 30
TZST-0001FASTVT: 3
TZST-0001SLOWVT: 3
TZST-0001SLOWVT: 5
TZST-0003FASTVT: 36 J
TZST-0003FASTVT: 36 J
TZST-0003SLOWVT: 15 J
TZST-0003SLOWVT: 36 J
TZST-0003SLOWVT: 36 J

## 2011-11-03 NOTE — Progress Notes (Signed)
defib check in clinic  

## 2011-11-24 ENCOUNTER — Ambulatory Visit (INDEPENDENT_AMBULATORY_CARE_PROVIDER_SITE_OTHER): Payer: Medicare Other | Admitting: *Deleted

## 2011-11-24 DIAGNOSIS — Z952 Presence of prosthetic heart valve: Secondary | ICD-10-CM

## 2011-11-24 DIAGNOSIS — Z954 Presence of other heart-valve replacement: Secondary | ICD-10-CM

## 2011-11-24 DIAGNOSIS — Z7901 Long term (current) use of anticoagulants: Secondary | ICD-10-CM

## 2011-11-24 DIAGNOSIS — I059 Rheumatic mitral valve disease, unspecified: Secondary | ICD-10-CM

## 2011-12-08 ENCOUNTER — Ambulatory Visit (INDEPENDENT_AMBULATORY_CARE_PROVIDER_SITE_OTHER): Payer: Medicare Other

## 2011-12-08 DIAGNOSIS — Z952 Presence of prosthetic heart valve: Secondary | ICD-10-CM

## 2011-12-08 DIAGNOSIS — Z7901 Long term (current) use of anticoagulants: Secondary | ICD-10-CM

## 2011-12-08 DIAGNOSIS — I059 Rheumatic mitral valve disease, unspecified: Secondary | ICD-10-CM

## 2011-12-08 DIAGNOSIS — Z954 Presence of other heart-valve replacement: Secondary | ICD-10-CM

## 2011-12-29 ENCOUNTER — Ambulatory Visit (INDEPENDENT_AMBULATORY_CARE_PROVIDER_SITE_OTHER): Payer: Medicare Other

## 2011-12-29 DIAGNOSIS — I059 Rheumatic mitral valve disease, unspecified: Secondary | ICD-10-CM

## 2011-12-29 DIAGNOSIS — Z954 Presence of other heart-valve replacement: Secondary | ICD-10-CM

## 2011-12-29 DIAGNOSIS — Z952 Presence of prosthetic heart valve: Secondary | ICD-10-CM

## 2011-12-29 DIAGNOSIS — Z7901 Long term (current) use of anticoagulants: Secondary | ICD-10-CM

## 2012-01-02 ENCOUNTER — Other Ambulatory Visit (HOSPITAL_COMMUNITY): Payer: Self-pay | Admitting: *Deleted

## 2012-01-02 MED ORDER — SPIRONOLACTONE 25 MG PO TABS: 25.0000 mg | ORAL_TABLET | Freq: Every day | ORAL | Status: DC

## 2012-01-12 ENCOUNTER — Ambulatory Visit (INDEPENDENT_AMBULATORY_CARE_PROVIDER_SITE_OTHER): Payer: Medicare Other | Admitting: *Deleted

## 2012-01-12 DIAGNOSIS — Z954 Presence of other heart-valve replacement: Secondary | ICD-10-CM

## 2012-01-12 DIAGNOSIS — Z952 Presence of prosthetic heart valve: Secondary | ICD-10-CM

## 2012-01-12 DIAGNOSIS — Z7901 Long term (current) use of anticoagulants: Secondary | ICD-10-CM

## 2012-01-12 DIAGNOSIS — I059 Rheumatic mitral valve disease, unspecified: Secondary | ICD-10-CM

## 2012-01-12 LAB — POCT INR: INR: 2.3

## 2012-01-30 ENCOUNTER — Other Ambulatory Visit (HOSPITAL_COMMUNITY): Payer: Self-pay | Admitting: Pulmonary Disease

## 2012-01-30 ENCOUNTER — Ambulatory Visit (HOSPITAL_COMMUNITY)
Admission: RE | Admit: 2012-01-30 | Discharge: 2012-01-30 | Disposition: A | Discharge status: Home / Self Care | Payer: Medicare Other | Source: Ambulatory Visit | Attending: Pulmonary Disease | Admitting: Pulmonary Disease

## 2012-01-30 DIAGNOSIS — R05 Cough: Secondary | ICD-10-CM

## 2012-01-30 DIAGNOSIS — I517 Cardiomegaly: Secondary | ICD-10-CM | POA: Insufficient documentation

## 2012-01-30 DIAGNOSIS — R059 Cough, unspecified: Secondary | ICD-10-CM | POA: Insufficient documentation

## 2012-02-06 ENCOUNTER — Encounter: Payer: Self-pay | Admitting: Internal Medicine

## 2012-02-06 ENCOUNTER — Ambulatory Visit (INDEPENDENT_AMBULATORY_CARE_PROVIDER_SITE_OTHER): Payer: Medicare Other | Admitting: *Deleted

## 2012-02-06 DIAGNOSIS — I428 Other cardiomyopathies: Secondary | ICD-10-CM

## 2012-02-06 DIAGNOSIS — Z952 Presence of prosthetic heart valve: Secondary | ICD-10-CM

## 2012-02-06 DIAGNOSIS — I059 Rheumatic mitral valve disease, unspecified: Secondary | ICD-10-CM

## 2012-02-06 DIAGNOSIS — I5022 Chronic systolic (congestive) heart failure: Secondary | ICD-10-CM

## 2012-02-06 DIAGNOSIS — Z954 Presence of other heart-valve replacement: Secondary | ICD-10-CM

## 2012-02-06 DIAGNOSIS — Z7901 Long term (current) use of anticoagulants: Secondary | ICD-10-CM

## 2012-02-06 LAB — ICD DEVICE OBSERVATION
DEV-0020ICD: NEGATIVE
FVT: 0
PACEART VT: 0
RV LEAD THRESHOLD: 0.75 V
TOT-0008: 0
TOT-0009: 1
TZAT-0001FASTVT: 1
TZAT-0001SLOWVT: 1
TZAT-0004FASTVT: 8
TZAT-0004SLOWVT: 8
TZAT-0012FASTVT: 200 ms
TZAT-0012SLOWVT: 200 ms
TZAT-0013SLOWVT: 3
TZAT-0019FASTVT: 7.5 V
TZAT-0020SLOWVT: 1 ms
TZON-0003FASTVT: 285 ms
TZON-0004FASTVT: 16
TZON-0010FASTVT: 80 ms
TZST-0001FASTVT: 3
TZST-0001FASTVT: 5
TZST-0001SLOWVT: 4
TZST-0001SLOWVT: 5
TZST-0003FASTVT: 25 J
TZST-0003FASTVT: 36 J
TZST-0003SLOWVT: 15 J
TZST-0003SLOWVT: 36 J
VENTRICULAR PACING ICD: 0.35 pct

## 2012-02-06 LAB — POCT INR: INR: 2.5

## 2012-02-06 NOTE — Patient Instructions (Addendum)
Return office visit 05/09/12 @ 10:00am.

## 2012-02-09 ENCOUNTER — Other Ambulatory Visit: Payer: Self-pay | Admitting: *Deleted

## 2012-02-09 MED ORDER — WARFARIN SODIUM 5 MG PO TABS: ORAL_TABLET | ORAL | Status: DC

## 2012-02-27 ENCOUNTER — Ambulatory Visit (INDEPENDENT_AMBULATORY_CARE_PROVIDER_SITE_OTHER): Payer: Medicare Other | Admitting: *Deleted

## 2012-02-27 DIAGNOSIS — Z7901 Long term (current) use of anticoagulants: Secondary | ICD-10-CM

## 2012-02-27 DIAGNOSIS — Z954 Presence of other heart-valve replacement: Secondary | ICD-10-CM

## 2012-02-27 DIAGNOSIS — I059 Rheumatic mitral valve disease, unspecified: Secondary | ICD-10-CM

## 2012-02-27 DIAGNOSIS — Z952 Presence of prosthetic heart valve: Secondary | ICD-10-CM

## 2012-02-27 LAB — POCT INR
INR: 3.4
INR: 3.4

## 2012-03-26 ENCOUNTER — Ambulatory Visit (INDEPENDENT_AMBULATORY_CARE_PROVIDER_SITE_OTHER): Payer: Medicare Other | Admitting: *Deleted

## 2012-03-26 DIAGNOSIS — Z952 Presence of prosthetic heart valve: Secondary | ICD-10-CM

## 2012-03-26 DIAGNOSIS — Z954 Presence of other heart-valve replacement: Secondary | ICD-10-CM

## 2012-03-26 DIAGNOSIS — I059 Rheumatic mitral valve disease, unspecified: Secondary | ICD-10-CM

## 2012-03-26 DIAGNOSIS — Z7901 Long term (current) use of anticoagulants: Secondary | ICD-10-CM

## 2012-03-26 LAB — POCT INR: INR: 1.8

## 2012-04-10 ENCOUNTER — Ambulatory Visit (INDEPENDENT_AMBULATORY_CARE_PROVIDER_SITE_OTHER): Payer: Medicare Other | Admitting: *Deleted

## 2012-04-10 DIAGNOSIS — Z954 Presence of other heart-valve replacement: Secondary | ICD-10-CM

## 2012-04-10 DIAGNOSIS — Z7901 Long term (current) use of anticoagulants: Secondary | ICD-10-CM

## 2012-04-10 DIAGNOSIS — Z952 Presence of prosthetic heart valve: Secondary | ICD-10-CM

## 2012-04-10 DIAGNOSIS — I059 Rheumatic mitral valve disease, unspecified: Secondary | ICD-10-CM

## 2012-04-11 ENCOUNTER — Emergency Department (HOSPITAL_COMMUNITY): Payer: Medicare Other

## 2012-04-11 ENCOUNTER — Encounter (HOSPITAL_COMMUNITY): Payer: Self-pay | Admitting: *Deleted

## 2012-04-11 ENCOUNTER — Emergency Department (HOSPITAL_COMMUNITY)
Admission: EM | Admit: 2012-04-11 | Discharge: 2012-04-11 | Disposition: A | Discharge status: Home / Self Care | Payer: Medicare Other | Attending: Emergency Medicine | Admitting: Emergency Medicine

## 2012-04-11 DIAGNOSIS — I10 Essential (primary) hypertension: Secondary | ICD-10-CM | POA: Insufficient documentation

## 2012-04-11 DIAGNOSIS — E119 Type 2 diabetes mellitus without complications: Secondary | ICD-10-CM | POA: Insufficient documentation

## 2012-04-11 DIAGNOSIS — Z87891 Personal history of nicotine dependence: Secondary | ICD-10-CM | POA: Insufficient documentation

## 2012-04-11 DIAGNOSIS — R05 Cough: Secondary | ICD-10-CM | POA: Insufficient documentation

## 2012-04-11 DIAGNOSIS — E785 Hyperlipidemia, unspecified: Secondary | ICD-10-CM | POA: Insufficient documentation

## 2012-04-11 DIAGNOSIS — Z95810 Presence of automatic (implantable) cardiac defibrillator: Secondary | ICD-10-CM | POA: Insufficient documentation

## 2012-04-11 DIAGNOSIS — IMO0001 Reserved for inherently not codable concepts without codable children: Secondary | ICD-10-CM | POA: Insufficient documentation

## 2012-04-11 DIAGNOSIS — I509 Heart failure, unspecified: Secondary | ICD-10-CM | POA: Insufficient documentation

## 2012-04-11 DIAGNOSIS — J3489 Other specified disorders of nose and nasal sinuses: Secondary | ICD-10-CM | POA: Insufficient documentation

## 2012-04-11 DIAGNOSIS — Z87442 Personal history of urinary calculi: Secondary | ICD-10-CM | POA: Insufficient documentation

## 2012-04-11 DIAGNOSIS — J069 Acute upper respiratory infection, unspecified: Secondary | ICD-10-CM | POA: Insufficient documentation

## 2012-04-11 DIAGNOSIS — R059 Cough, unspecified: Secondary | ICD-10-CM | POA: Insufficient documentation

## 2012-04-11 DIAGNOSIS — Z8679 Personal history of other diseases of the circulatory system: Secondary | ICD-10-CM | POA: Insufficient documentation

## 2012-04-11 DIAGNOSIS — Z79899 Other long term (current) drug therapy: Secondary | ICD-10-CM | POA: Insufficient documentation

## 2012-04-11 LAB — CBC WITH DIFFERENTIAL/PLATELET
Basophils Absolute: 0 10*3/uL (ref 0.0–0.1)
Basophils Relative: 0 % (ref 0–1)
Eosinophils Absolute: 0.1 10*3/uL (ref 0.0–0.7)
Hemoglobin: 10.8 g/dL — ABNORMAL LOW (ref 12.0–15.0)
MCHC: 33.1 g/dL (ref 30.0–36.0)
Monocytes Relative: 7 % (ref 3–12)
Neutro Abs: 5 10*3/uL (ref 1.7–7.7)
Neutrophils Relative %: 73 % (ref 43–77)
Platelets: 202 10*3/uL (ref 150–400)
RDW: 12.3 % (ref 11.5–15.5)

## 2012-04-11 LAB — COMPREHENSIVE METABOLIC PANEL
AST: 20 U/L (ref 0–37)
Albumin: 3.8 g/dL (ref 3.5–5.2)
Alkaline Phosphatase: 62 U/L (ref 39–117)
Chloride: 104 mEq/L (ref 96–112)
Potassium: 4 mEq/L (ref 3.5–5.1)
Sodium: 141 mEq/L (ref 135–145)
Total Bilirubin: 0.2 mg/dL — ABNORMAL LOW (ref 0.3–1.2)
Total Protein: 7.5 g/dL (ref 6.0–8.3)

## 2012-04-11 MED ORDER — ALBUTEROL SULFATE HFA 108 (90 BASE) MCG/ACT IN AERS: 2.0000 | INHALATION_SPRAY | Freq: Four times a day (QID) | RESPIRATORY_TRACT | Status: DC | PRN

## 2012-04-11 MED ORDER — OSELTAMIVIR PHOSPHATE 75 MG PO CAPS: 75.0000 mg | ORAL_CAPSULE | Freq: Two times a day (BID) | ORAL | Status: DC

## 2012-04-11 NOTE — ED Provider Notes (Signed)
History     CSN: [id number]  Arrival date & time 04/11/12  1351   First MD Initiated Contact with Patient 04/11/12 1451      Chief Complaint  Patient presents with  . Fever    (Consider location/radiation/quality/duration/timing/severity/associated sxs/prior treatment) Patient is a 67 y.o. female presenting with fever. The history is provided by the patient.  Fever Primary symptoms of the febrile illness include fever, cough and myalgias. Primary symptoms do not include headaches, shortness of breath, abdominal pain, vomiting, diarrhea or rash.  pt c/o subjective fever, non productive cough, scratchy throat, nasal congestion, body aches for past couple days. No sob. No chest pain. No known ill contacts. Denies headache or neck pain. Cough episodic, non productive. No specific exacerbating or alleviating factors. Non smoker. Denies hx copd/asthma. Feels wheezy at times. No sob currently. No nvd. Eating and drinking normally. No gi c/o.  No dysuria or gu c/o.     Past Medical History  Diagnosis Date  . CHF (congestive heart failure)     --Non-ischemic CM EF 30-35% by echo 9/09 (Previous 25%)  --s/p St. Jude single chamber ICD 04/09 --Minimal  CAD by cath 2004 LAD 20% LCX 20% Ramus 30% RCA nl --echo 05/11 EF 20-25%  . MR (congenital mitral regurgitation)     Severe MR s/p St. Jude mechanical MVR by Gasper Lloyd 2005  . Aortic stenosis, mild     mean gradient echo 09/09  . Diabetes mellitus     type 2  . Kidney stones   . Hyperlipidemia   . Hypertension     Past Surgical History  Procedure Date  . Appendectomy 1979  . Cardiac defibrillator placement 2009  . Insert / replace / remove pacemaker   . Cardiac valve surgery 2005    Family History  Problem Relation Age of Onset  . Heart disease Mother     History  Substance Use Topics  . Smoking status: Former Smoker    Quit date: 06/16/1984  . Smokeless tobacco: Never Used  . Alcohol Use: No    OB History    Grav  Para Term Preterm Abortions TAB SAB Ect Mult Living                  Review of Systems  Constitutional: Positive for fever. Negative for chills.  HENT: Positive for congestion and rhinorrhea. Negative for neck pain.   Eyes: Negative for redness.  Respiratory: Positive for cough. Negative for shortness of breath.   Cardiovascular: Negative for chest pain.  Gastrointestinal: Negative for vomiting, abdominal pain and diarrhea.  Genitourinary: Negative for flank pain.  Musculoskeletal: Positive for myalgias. Negative for back pain.  Skin: Negative for rash.  Neurological: Negative for headaches.  Hematological: Does not bruise/bleed easily.  Psychiatric/Behavioral: Negative for confusion.    Allergies  Strawberry  Home Medications   Current Outpatient Rx  Name  Route  Sig  Dispense  Refill  . ATORVASTATIN CALCIUM 40 MG PO TABS   Oral   Take 40 mg by mouth daily.           Marland Kitchen CARVEDILOL 25 MG PO TABS   Oral   Take 25 mg by mouth 2 (two) times daily with a meal.           . CHOLINE FENOFIBRATE 135 MG PO CPDR   Oral   Take 135 mg by mouth daily.           Marland Kitchen DIGOXIN 0.125 MG PO TABS  Oral   Take 125 mcg by mouth daily.           . OMEGA-3 FATTY ACIDS 1000 MG PO CAPS   Oral   Take by mouth daily.          Marland Kitchen HYDROCODONE-ACETAMINOPHEN 5-500 MG PO TABS   Oral   Take 1 tablet by mouth as needed.           Marland Kitchen METFORMIN HCL 1000 MG PO TABS   Oral   Take 1,000 mg by mouth 2 (two) times daily with a meal.           . QUINAPRIL HCL 40 MG PO TABS   Oral   Take 40 mg by mouth daily.           Marland Kitchen SPIRONOLACTONE 25 MG PO TABS   Oral   Take 1 tablet (25 mg total) by mouth daily.   30 tablet   6   . WARFARIN SODIUM 5 MG PO TABS   Oral   Take 5-7.5 mg by mouth daily. Takes 5 mg on Monday, Wednesday and Friday and 7.5 mg all other days           BP 141/103  Pulse 75  Temp 97.8 F (36.6 C) (Oral)  Resp 22  SpO2 98%  Physical Exam  Nursing note and  vitals reviewed. Constitutional: She is oriented to person, place, and time. She appears well-developed and well-nourished. No distress.  HENT:  Mouth/Throat: Oropharynx is clear and moist.       Mild nasal congestion. No sinus tenderness  Eyes: Conjunctivae normal are normal. No scleral icterus.  Neck: Neck supple. No tracheal deviation present.       No stiffness or rigidity  Cardiovascular: Normal rate, regular rhythm, normal heart sounds and intact distal pulses.   Pulmonary/Chest: Effort normal and breath sounds normal. No respiratory distress.  Abdominal: Soft. Normal appearance and bowel sounds are normal. She exhibits no distension. There is no tenderness.  Genitourinary:       No cva tenderness  Musculoskeletal: She exhibits no edema and no tenderness.  Neurological: She is alert and oriented to person, place, and time.  Skin: Skin is warm and dry. No rash noted.  Psychiatric: She has a normal mood and affect.    ED Course  Procedures (including critical care time)  Labs Reviewed  CBC WITH DIFFERENTIAL - Abnormal; Notable for the following:    RBC 3.59 (*)     Hemoglobin 10.8 (*)     HCT 32.6 (*)     All other components within normal limits  COMPREHENSIVE METABOLIC PANEL - Abnormal; Notable for the following:    Glucose, Bld 188 (*)     Total Bilirubin 0.2 (*)     GFR calc non Af Amer 85 (*)     All other components within normal limits  PROTIME-INR - Abnormal; Notable for the following:    Prothrombin Time 18.5 (*)     INR 1.59 (*)     All other components within normal limits   Dg Chest 2 View  04/11/2012  *RADIOLOGY REPORT*  Clinical Data: Fever.  Cough and chills  CHEST - 2 VIEW  Comparison: 01/30/2012  Findings: Previous median sternotomy CABG procedure.  There is a left chest wall AICD with lead in the right ventricle.  Moderate cardiac enlargement noted.  No pleural effusions or edema.  Mild multilevel thoracic spondylosis is identified.  IMPRESSION:  1.  No  acute cardiopulmonary abnormalities.  Original Report Authenticated By: Signa Kell, M.D.        MDM  pts symptoms felt c/w viral/flu like illness. No pna on cxr. No increased wob in ed.   Will rx tamiflu, alb mdi prn.  Discussed labs sent from triage, incl inr which she will call her md w today to discuss and possible adjustment in coumadin dose.  Prior inr similar.         Suzi Roots, MD 04/11/12 [phone]

## 2012-04-11 NOTE — ED Notes (Signed)
Pt is coughing up thick white and thick green sputum

## 2012-04-11 NOTE — ED Notes (Signed)
PT is here with cough and fever since Friday.  Pt denies chest pain or sob.

## 2012-04-12 ENCOUNTER — Ambulatory Visit: Payer: Self-pay | Admitting: Cardiology

## 2012-04-12 ENCOUNTER — Telehealth: Payer: Self-pay | Admitting: Internal Medicine

## 2012-04-12 DIAGNOSIS — Z7901 Long term (current) use of anticoagulants: Secondary | ICD-10-CM

## 2012-04-12 DIAGNOSIS — Z952 Presence of prosthetic heart valve: Secondary | ICD-10-CM

## 2012-04-12 DIAGNOSIS — I059 Rheumatic mitral valve disease, unspecified: Secondary | ICD-10-CM

## 2012-04-12 NOTE — Telephone Encounter (Signed)
Talked with pt and informed that Mucinex  Robitussin and Tamiflu were okay to take with her coumadin. Her INR yesterday was  1.59 so please see coumadin encounter as will dose this INR .

## 2012-04-12 NOTE — Telephone Encounter (Signed)
New Problem:    Patient called in wanting to see if a medication she received in the ER was ok to take with her coumadin.  Please call back.

## 2012-04-24 ENCOUNTER — Ambulatory Visit (INDEPENDENT_AMBULATORY_CARE_PROVIDER_SITE_OTHER): Payer: Medicare Other | Admitting: *Deleted

## 2012-04-24 DIAGNOSIS — Z954 Presence of other heart-valve replacement: Secondary | ICD-10-CM

## 2012-04-24 DIAGNOSIS — I059 Rheumatic mitral valve disease, unspecified: Secondary | ICD-10-CM

## 2012-04-24 DIAGNOSIS — Z7901 Long term (current) use of anticoagulants: Secondary | ICD-10-CM

## 2012-04-24 DIAGNOSIS — Z952 Presence of prosthetic heart valve: Secondary | ICD-10-CM

## 2012-05-10 ENCOUNTER — Encounter: Payer: Self-pay | Admitting: Cardiology

## 2012-05-10 ENCOUNTER — Ambulatory Visit (INDEPENDENT_AMBULATORY_CARE_PROVIDER_SITE_OTHER): Payer: Medicare Other | Admitting: Cardiology

## 2012-05-10 ENCOUNTER — Encounter: Payer: Self-pay | Admitting: Internal Medicine

## 2012-05-10 DIAGNOSIS — I5022 Chronic systolic (congestive) heart failure: Secondary | ICD-10-CM

## 2012-05-10 DIAGNOSIS — Z95810 Presence of automatic (implantable) cardiac defibrillator: Secondary | ICD-10-CM

## 2012-05-10 DIAGNOSIS — I428 Other cardiomyopathies: Secondary | ICD-10-CM

## 2012-05-10 LAB — ICD DEVICE OBSERVATION
BRDY-0002RV: 40 {beats}/min
DEVICE MODEL ICD: 543269
FVT: 0
RV LEAD AMPLITUDE: 12 mv
RV LEAD IMPEDENCE ICD: 450 Ohm
RV LEAD THRESHOLD: 0.75 V
TZAT-0001SLOWVT: 1
TZAT-0004SLOWVT: 8
TZAT-0013FASTVT: 1
TZAT-0018FASTVT: NEGATIVE
TZAT-0018SLOWVT: NEGATIVE
TZAT-0019SLOWVT: 7.5 V
TZAT-0020FASTVT: 1 ms
TZON-0003FASTVT: 285 ms
TZON-0003SLOWVT: 350 ms
TZON-0004SLOWVT: 30
TZON-0005FASTVT: 6
TZON-0005SLOWVT: 6
TZON-0010FASTVT: 80 ms
TZON-0010SLOWVT: 80 ms
TZST-0001FASTVT: 2
TZST-0001FASTVT: 4
TZST-0001SLOWVT: 5
TZST-0003FASTVT: 36 J
TZST-0003FASTVT: 36 J
TZST-0003SLOWVT: 25 J
VENTRICULAR PACING ICD: 0 pct
VF: 0

## 2012-05-10 NOTE — Progress Notes (Signed)
ICD check/device clinic only. Scheduled to see Dr. Graciela Husbands in April 2014. See PaceArt report.

## 2012-05-15 ENCOUNTER — Ambulatory Visit (INDEPENDENT_AMBULATORY_CARE_PROVIDER_SITE_OTHER): Payer: Medicare Other | Admitting: *Deleted

## 2012-05-15 DIAGNOSIS — I059 Rheumatic mitral valve disease, unspecified: Secondary | ICD-10-CM

## 2012-05-15 DIAGNOSIS — Z7901 Long term (current) use of anticoagulants: Secondary | ICD-10-CM

## 2012-05-15 DIAGNOSIS — Z954 Presence of other heart-valve replacement: Secondary | ICD-10-CM

## 2012-05-15 DIAGNOSIS — Z952 Presence of prosthetic heart valve: Secondary | ICD-10-CM

## 2012-05-29 ENCOUNTER — Ambulatory Visit (INDEPENDENT_AMBULATORY_CARE_PROVIDER_SITE_OTHER): Payer: Medicare Other | Admitting: *Deleted

## 2012-05-29 DIAGNOSIS — Z7901 Long term (current) use of anticoagulants: Secondary | ICD-10-CM

## 2012-05-29 DIAGNOSIS — I059 Rheumatic mitral valve disease, unspecified: Secondary | ICD-10-CM

## 2012-05-29 DIAGNOSIS — Z952 Presence of prosthetic heart valve: Secondary | ICD-10-CM

## 2012-05-29 DIAGNOSIS — Z954 Presence of other heart-valve replacement: Secondary | ICD-10-CM

## 2012-06-07 ENCOUNTER — Ambulatory Visit (INDEPENDENT_AMBULATORY_CARE_PROVIDER_SITE_OTHER): Payer: Medicare Other | Admitting: *Deleted

## 2012-06-07 LAB — POCT INR: INR: 3.1

## 2012-06-25 ENCOUNTER — Ambulatory Visit (INDEPENDENT_AMBULATORY_CARE_PROVIDER_SITE_OTHER): Payer: Medicare Other | Admitting: *Deleted

## 2012-06-25 DIAGNOSIS — I059 Rheumatic mitral valve disease, unspecified: Secondary | ICD-10-CM

## 2012-06-25 DIAGNOSIS — Z7901 Long term (current) use of anticoagulants: Secondary | ICD-10-CM

## 2012-08-03 ENCOUNTER — Encounter: Payer: Self-pay | Admitting: Internal Medicine

## 2012-08-03 ENCOUNTER — Ambulatory Visit (INDEPENDENT_AMBULATORY_CARE_PROVIDER_SITE_OTHER): Payer: Medicare Other | Admitting: Internal Medicine

## 2012-08-03 ENCOUNTER — Telehealth: Payer: Self-pay | Admitting: *Deleted

## 2012-08-03 VITALS — BP 134/90 | HR 84 | Ht 63.0 in | Wt 164.0 lb

## 2012-08-03 DIAGNOSIS — I059 Rheumatic mitral valve disease, unspecified: Secondary | ICD-10-CM

## 2012-08-03 DIAGNOSIS — I5022 Chronic systolic (congestive) heart failure: Secondary | ICD-10-CM

## 2012-08-03 DIAGNOSIS — I428 Other cardiomyopathies: Secondary | ICD-10-CM | POA: Insufficient documentation

## 2012-08-03 DIAGNOSIS — Z954 Presence of other heart-valve replacement: Secondary | ICD-10-CM

## 2012-08-03 DIAGNOSIS — Z952 Presence of prosthetic heart valve: Secondary | ICD-10-CM

## 2012-08-03 DIAGNOSIS — Z7901 Long term (current) use of anticoagulants: Secondary | ICD-10-CM

## 2012-08-03 DIAGNOSIS — Z95810 Presence of automatic (implantable) cardiac defibrillator: Secondary | ICD-10-CM

## 2012-08-03 LAB — ICD DEVICE OBSERVATION
BRDY-0002RV: 40 {beats}/min
DEVICE MODEL ICD: 543269
FVT: 0
PACEART VT: 0
RV LEAD AMPLITUDE: 10.1 mv
RV LEAD IMPEDENCE ICD: 412.5 Ohm
TOT-0007: 3
TOT-0008: 0
TZAT-0001SLOWVT: 1
TZAT-0004FASTVT: 8
TZAT-0004SLOWVT: 8
TZAT-0012FASTVT: 200 ms
TZAT-0012SLOWVT: 200 ms
TZAT-0013FASTVT: 1
TZAT-0018FASTVT: NEGATIVE
TZAT-0019SLOWVT: 7.5 V
TZON-0003FASTVT: 285 ms
TZON-0003SLOWVT: 350 ms
TZON-0004SLOWVT: 30
TZON-0010SLOWVT: 80 ms
TZST-0001FASTVT: 3
TZST-0001SLOWVT: 3
TZST-0001SLOWVT: 5
TZST-0003FASTVT: 36 J
TZST-0003FASTVT: 36 J
TZST-0003FASTVT: 36 J
TZST-0003SLOWVT: 15 J
TZST-0003SLOWVT: 36 J
TZST-0003SLOWVT: 36 J
VENTRICULAR PACING ICD: 0.2 pct

## 2012-08-03 NOTE — Progress Notes (Signed)
Patient Care Team: Eino Farber., MD (Pulmonary Disease) Dolores Patty, MD (Cardiology) Duke Salvia, MD (Cardiology)   HPI  Deborah [name] is a 67 y.o. female Seen in followup for ICD implantation for primary prevention   with a history of congestive heart failure, secondary to nonischemic cardiomyopathy.  Echo January 2013 ejection fraction around 25%    She also has a history of mitral valve disease and status post mitral valve replacement with a St. Jude valve in 2005    The patient denies chest pain, shortness of breath, nocturnal dyspnea, orthopnea or peripheral edema.  There have been no palpitations, lightheadedness or syncope.   Hears good valve clicks   Past Medical History  Diagnosis Date  . CHF (congestive heart failure)     --Non-ischemic CM EF 30-35% by echo 9/09 (Previous 25%)  --s/p St. Jude single chamber ICD 04/09 --Minimal  CAD by cath 2004 LAD 20% LCX 20% Ramus 30% RCA nl --echo 05/11 EF 20-25%  . MR (congenital mitral regurgitation)     Severe MR s/p St. Jude mechanical MVR by Gasper Lloyd 2005  . Aortic stenosis, mild     mean gradient echo 09/09  . Diabetes mellitus     type 2  . Kidney stones   . Hyperlipidemia   . Hypertension     Past Surgical History  Procedure Laterality Date  . Appendectomy  1979  . Cardiac defibrillator placement  2009  . Insert / replace / remove pacemaker    . Cardiac valve surgery  2005    Current Outpatient Prescriptions  Medication Sig Dispense Refill  . atorvastatin (LIPITOR) 40 MG tablet Take 40 mg by mouth daily.        . carvedilol (COREG) 25 MG tablet Take 25 mg by mouth 2 (two) times daily with a meal.        . Choline Fenofibrate (TRILIPIX) 135 MG capsule Take 135 mg by mouth daily.        . digoxin (LANOXIN) 0.125 MG tablet Take 125 mcg by mouth daily.        . fish oil-omega-3 fatty acids 1000 MG capsule Take by mouth daily.       Marland Kitchen HYDROcodone-acetaminophen (VICODIN) 5-500 MG per  tablet Take 1 tablet by mouth as needed.        . metFORMIN (GLUCOPHAGE) 1000 MG tablet Take 1,000 mg by mouth 2 (two) times daily with a meal.        . quinapril (ACCUPRIL) 40 MG tablet Take 40 mg by mouth daily.        Marland Kitchen spironolactone (ALDACTONE) 25 MG tablet Take 1 tablet (25 mg total) by mouth daily.  30 tablet  6  . warfarin (COUMADIN) 5 MG tablet Take 5-7.5 mg by mouth daily. Takes 5 mg on Monday, Wednesday and Friday and 7.5 mg all other days      . albuterol (PROVENTIL HFA;VENTOLIN HFA) 108 (90 BASE) MCG/ACT inhaler Inhale 2 puffs into the lungs every 6 (six) hours as needed for wheezing.  1 Inhaler  0   No current facility-administered medications for this visit.    Allergies  Allergen Reactions  . Strawberry     Rash & itching.    Review of Systems negative except from HPI and PMH  Physical Exam BP 134/90  Pulse 84  Ht 5\' 3"  (1.6 m)  Wt 164 lb (74.39 kg)  BMI 29.06 kg/m2 Well developed and well nourished in no acute distress  HENT normal E scleral and icterus clear Neck Supple JVP flat; carotids brisk and full Clear to ausculation Device pocket well healed; without hematoma or erythema   Regular rate and rhythm, mechanical S1  Soft with active bowel sounds No clubbing cyanosis none Edema Alert and oriented, grossly normal motor and sensory function Skin Warm and Dry    Assessment and  Plan

## 2012-08-03 NOTE — Telephone Encounter (Signed)
Received call from pt on Wednesday 08/01/2012 that she was now a self tester and that her INR was 1.9 and this nurse asked where she obtained  Coumatrix machine and who was dosing her coumadin and she states she will call back with this information. Pt did not call back so today 08/03/2012 this nurse  called and spoke with pt and she states Dr Petra Kuba is dosing her coumadin and that her INR was 1.5 and her coumadin is being dosed by Dr Petra Kuba

## 2012-08-03 NOTE — Patient Instructions (Addendum)
Your physician recommends that you schedule a follow-up appointment in: 3 months with the Device Clinic  Your physician wants you to follow-up in: 1 year with Dr Logan Bores will receive a reminder letter in the mail two months in advance. If you don't receive a letter, please call our office to schedule the follow-up appointment.

## 2012-08-03 NOTE — Assessment & Plan Note (Signed)
euvolemic in stable. Has not been seen by heart failure clinic here. We'll reschedule followup. She has blood work has been checked by Dr. Dorothea Ogle Luisa Hart

## 2012-08-03 NOTE — Assessment & Plan Note (Signed)
On guideline directed therapy. She is not on hydralazine/nitrates this could be considered by the fourth 13. She is euvolemic.

## 2012-08-03 NOTE — Assessment & Plan Note (Signed)
The patient's device was interrogated.  The information was reviewed. No changes were made in the programming.    

## 2012-08-29 ENCOUNTER — Telehealth: Payer: Self-pay | Admitting: Internal Medicine

## 2012-08-29 NOTE — Telephone Encounter (Signed)
New problem    Pt has question about when/if she needs to schedule for coumadin

## 2012-08-29 NOTE — Telephone Encounter (Signed)
Per our last anticoag note in Epic pt is now a self-tester followed by Dr Petra Kuba.  Called spoke with pt, pt states she is unable to do the self-testing without messing up a whole bunch of strips.  Doesn't have more strips, and home testing company does not seem to want to send her anymore.  Pt wishes to stop self-testing and return to Coumadin Clinic.  Made OV for 08/30/12 at 11:00am.  Pt aware of appt date and time.

## 2012-08-30 ENCOUNTER — Ambulatory Visit (INDEPENDENT_AMBULATORY_CARE_PROVIDER_SITE_OTHER): Payer: Medicare Other | Admitting: *Deleted

## 2012-08-30 DIAGNOSIS — I059 Rheumatic mitral valve disease, unspecified: Secondary | ICD-10-CM

## 2012-08-30 DIAGNOSIS — Z7901 Long term (current) use of anticoagulants: Secondary | ICD-10-CM

## 2012-08-30 DIAGNOSIS — Z952 Presence of prosthetic heart valve: Secondary | ICD-10-CM

## 2012-08-30 DIAGNOSIS — Z954 Presence of other heart-valve replacement: Secondary | ICD-10-CM

## 2012-09-06 ENCOUNTER — Other Ambulatory Visit (HOSPITAL_COMMUNITY): Payer: Self-pay | Admitting: *Deleted

## 2012-09-06 MED ORDER — SPIRONOLACTONE 25 MG PO TABS: 25.0000 mg | ORAL_TABLET | Freq: Every day | ORAL | Status: DC

## 2012-09-18 ENCOUNTER — Ambulatory Visit (INDEPENDENT_AMBULATORY_CARE_PROVIDER_SITE_OTHER): Payer: Medicare Other | Admitting: Cardiology

## 2012-09-18 DIAGNOSIS — I059 Rheumatic mitral valve disease, unspecified: Secondary | ICD-10-CM

## 2012-09-18 DIAGNOSIS — Z7901 Long term (current) use of anticoagulants: Secondary | ICD-10-CM

## 2012-09-20 ENCOUNTER — Ambulatory Visit (HOSPITAL_COMMUNITY)
Admission: RE | Admit: 2012-09-20 | Discharge: 2012-09-20 | Disposition: A | Discharge status: Home / Self Care | Payer: Medicare PPO | Source: Ambulatory Visit | Attending: Internal Medicine | Admitting: Internal Medicine

## 2012-09-20 ENCOUNTER — Encounter (HOSPITAL_COMMUNITY): Payer: Self-pay

## 2012-09-20 VITALS — BP 120/70 | HR 83 | Wt 165.8 lb

## 2012-09-20 DIAGNOSIS — I10 Essential (primary) hypertension: Secondary | ICD-10-CM | POA: Insufficient documentation

## 2012-09-20 DIAGNOSIS — I059 Rheumatic mitral valve disease, unspecified: Secondary | ICD-10-CM

## 2012-09-20 DIAGNOSIS — Z954 Presence of other heart-valve replacement: Secondary | ICD-10-CM | POA: Insufficient documentation

## 2012-09-20 DIAGNOSIS — E785 Hyperlipidemia, unspecified: Secondary | ICD-10-CM | POA: Insufficient documentation

## 2012-09-20 DIAGNOSIS — R110 Nausea: Secondary | ICD-10-CM

## 2012-09-20 DIAGNOSIS — I428 Other cardiomyopathies: Secondary | ICD-10-CM | POA: Insufficient documentation

## 2012-09-20 DIAGNOSIS — I5022 Chronic systolic (congestive) heart failure: Secondary | ICD-10-CM

## 2012-09-20 DIAGNOSIS — E119 Type 2 diabetes mellitus without complications: Secondary | ICD-10-CM | POA: Insufficient documentation

## 2012-09-20 DIAGNOSIS — I509 Heart failure, unspecified: Secondary | ICD-10-CM | POA: Insufficient documentation

## 2012-09-20 MED ORDER — PANTOPRAZOLE SODIUM 40 MG PO TBEC: 40.0000 mg | DELAYED_RELEASE_TABLET | Freq: Every day | ORAL | Status: DC

## 2012-09-20 NOTE — Assessment & Plan Note (Signed)
Doing very well. Will see back for 6 months for repeat echo. Reminded her of need for SBE for dental and other procedures.

## 2012-09-20 NOTE — Patient Instructions (Addendum)
Start Protonix 40 mg daily  Your physician recommends that you schedule a follow-up appointment in: 6 months with an echocardiogram

## 2012-09-20 NOTE — Assessment & Plan Note (Signed)
Unclear etiology. May be reflux. Will start Protonix. If continues may benefit from work-up for diabetic gastroparesis.

## 2012-09-20 NOTE — Progress Notes (Signed)
PCP: Petra Kuba  HPI:  Deborah [name] is a very pleasant 67 year old woman with a history of congestive heart failure secondary to nonischemic cardiomyopathy, ejection fraction 25% s/p St Jude single chamber ICD.  She also has a history of mitral valve disease and status post mitral valve replacement with a St. Jude valve in 2005.  Remainder of her history is notable for diabetes, hypertension, hyperlipidemia, and kidney stones.   CPX performed June 2011 pVO2 16.8 slope 26.1 RER 1.11 O2 pulse 90% predicted O2 sat 87% at peak with possible hypoventilation.  05/12/11 ECHO 20-25%.  Mechanical MVR appears normal.    She is for follow up today. Say for past 3 years she feels nauseated in the morning. Starts with a cough then feels nauseated. No ab pain or vomiting. No acid brash.Worse when she feels hot. Denies DOE/orthopnea or PND. No swelling. Not weighing every day. Says she doesn't have a scale that works. Has lost 10 pounds since last year. Appetite ok. Continues with coumadin for MVR. Occasionally dizzy when she stands.   ROS: All systems negative except as listed in HPI, PMH and Problem List.  Past Medical History  Diagnosis Date  . CHF (congestive heart failure)     --Non-ischemic CM EF 30-35% by echo 9/09 (Previous 25%)  --s/p St. Jude single chamber ICD 04/09 --Minimal  CAD by cath 2004 LAD 20% LCX 20% Ramus 30% RCA nl --echo 05/11 EF 20-25%  . MR (congenital mitral regurgitation)     Severe MR s/p St. Jude mechanical MVR by Gasper Lloyd 2005  . Aortic stenosis, mild     mean gradient echo 09/09  . Diabetes mellitus     type 2  . Kidney stones   . Hyperlipidemia   . Hypertension     Current Outpatient Prescriptions  Medication Sig Dispense Refill  . albuterol (PROVENTIL HFA;VENTOLIN HFA) 108 (90 BASE) MCG/ACT inhaler Inhale 2 puffs into the lungs every 6 (six) hours as needed for wheezing.  1 Inhaler  0  . atorvastatin (LIPITOR) 40 MG tablet Take 40 mg by mouth daily.        .  carvedilol (COREG) 25 MG tablet Take 25 mg by mouth 2 (two) times daily with a meal.        . Choline Fenofibrate (TRILIPIX) 135 MG capsule Take 135 mg by mouth daily.        . digoxin (LANOXIN) 0.125 MG tablet Take 125 mcg by mouth daily.        . fish oil-omega-3 fatty acids 1000 MG capsule Take by mouth daily.       Marland Kitchen HYDROcodone-acetaminophen (VICODIN) 5-500 MG per tablet Take 1 tablet by mouth as needed.        . metFORMIN (GLUCOPHAGE) 1000 MG tablet Take 1,000 mg by mouth 2 (two) times daily with a meal.        . quinapril (ACCUPRIL) 40 MG tablet Take 40 mg by mouth daily.        Marland Kitchen spironolactone (ALDACTONE) 25 MG tablet Take 1 tablet (25 mg total) by mouth daily.  30 tablet  6  . warfarin (COUMADIN) 5 MG tablet Take 5-7.5 mg by mouth daily. Takes 5 mg on Monday, Wednesday and Friday and 7.5 mg all other days       No current facility-administered medications for this encounter.     PHYSICAL EXAM: Filed Vitals:   09/20/12 0950  BP: 120/70  Pulse: 83  Weight: 165 lb 12.8 oz (75.206 kg)  SpO2: 99%   General:    Well appearing no distress HEENT Normal Neck: JVP 5-6 + prominent cv waves carotids were brisk. no bruits Lungs clear  Cor: RRR. Heart sounds were regular with mechanical s1. No murmur Abdomen was soft with active bowel sounds.Nontender There is no clubbing cyanosis.  No edema Skin Warm and dry Neuro  oriented x 3  cranial nerves II-XII grossly intact. moves all 4 extremities without difficulty.   ASSESSMENT & PLAN:

## 2012-09-20 NOTE — Assessment & Plan Note (Signed)
Doing very well. NYHA II. Volume status looks good. Will give her a scale today if we have one. On excellent medical regimen. Would not start hydral/ntg given occasional orthostasis and risk of symptomatic hypotension.

## 2012-09-20 NOTE — Addendum Note (Signed)
Encounter addended by: Noralee Space, RN on: 09/20/2012 10:26 AM<BR>     Documentation filed: Orders, Patient Instructions Section

## 2012-10-02 ENCOUNTER — Ambulatory Visit (INDEPENDENT_AMBULATORY_CARE_PROVIDER_SITE_OTHER): Payer: Medicare PPO | Admitting: *Deleted

## 2012-10-02 DIAGNOSIS — Z954 Presence of other heart-valve replacement: Secondary | ICD-10-CM

## 2012-10-02 DIAGNOSIS — Z7901 Long term (current) use of anticoagulants: Secondary | ICD-10-CM

## 2012-10-02 DIAGNOSIS — I059 Rheumatic mitral valve disease, unspecified: Secondary | ICD-10-CM

## 2012-10-02 DIAGNOSIS — Z952 Presence of prosthetic heart valve: Secondary | ICD-10-CM

## 2012-10-02 LAB — POCT INR: INR: 2.2

## 2012-10-16 ENCOUNTER — Ambulatory Visit (INDEPENDENT_AMBULATORY_CARE_PROVIDER_SITE_OTHER): Payer: Medicare PPO | Admitting: *Deleted

## 2012-10-16 DIAGNOSIS — I059 Rheumatic mitral valve disease, unspecified: Secondary | ICD-10-CM

## 2012-10-16 DIAGNOSIS — Z954 Presence of other heart-valve replacement: Secondary | ICD-10-CM

## 2012-10-16 DIAGNOSIS — Z952 Presence of prosthetic heart valve: Secondary | ICD-10-CM

## 2012-10-16 DIAGNOSIS — Z7901 Long term (current) use of anticoagulants: Secondary | ICD-10-CM

## 2012-10-16 LAB — POCT INR: INR: 2.8

## 2012-10-24 ENCOUNTER — Other Ambulatory Visit (HOSPITAL_COMMUNITY): Payer: Self-pay | Admitting: *Deleted

## 2012-10-24 MED ORDER — SPIRONOLACTONE 25 MG PO TABS: 25.0000 mg | ORAL_TABLET | Freq: Every day | ORAL | Status: DC

## 2012-10-24 MED ORDER — ATORVASTATIN CALCIUM 40 MG PO TABS: 40.0000 mg | ORAL_TABLET | Freq: Every day | ORAL | Status: DC

## 2012-10-24 MED ORDER — DIGOXIN 125 MCG PO TABS: 125.0000 ug | ORAL_TABLET | Freq: Every day | ORAL | Status: DC

## 2012-10-24 MED ORDER — CHOLINE FENOFIBRATE 135 MG PO CPDR: 135.0000 mg | DELAYED_RELEASE_CAPSULE | Freq: Every day | ORAL | Status: DC

## 2012-10-24 MED ORDER — QUINAPRIL HCL 40 MG PO TABS: 40.0000 mg | ORAL_TABLET | Freq: Every day | ORAL | Status: DC

## 2012-10-24 MED ORDER — CARVEDILOL 25 MG PO TABS: 25.0000 mg | ORAL_TABLET | Freq: Two times a day (BID) | ORAL | Status: DC

## 2012-11-01 ENCOUNTER — Ambulatory Visit (INDEPENDENT_AMBULATORY_CARE_PROVIDER_SITE_OTHER): Payer: Medicare PPO

## 2012-11-01 DIAGNOSIS — I059 Rheumatic mitral valve disease, unspecified: Secondary | ICD-10-CM

## 2012-11-01 DIAGNOSIS — Z952 Presence of prosthetic heart valve: Secondary | ICD-10-CM

## 2012-11-01 DIAGNOSIS — Z954 Presence of other heart-valve replacement: Secondary | ICD-10-CM

## 2012-11-01 DIAGNOSIS — Z7901 Long term (current) use of anticoagulants: Secondary | ICD-10-CM

## 2012-11-01 LAB — POCT INR: INR: 2.4

## 2012-11-07 ENCOUNTER — Ambulatory Visit (INDEPENDENT_AMBULATORY_CARE_PROVIDER_SITE_OTHER): Payer: Medicare PPO | Admitting: *Deleted

## 2012-11-07 DIAGNOSIS — Z95810 Presence of automatic (implantable) cardiac defibrillator: Secondary | ICD-10-CM

## 2012-11-07 DIAGNOSIS — I428 Other cardiomyopathies: Secondary | ICD-10-CM

## 2012-11-07 NOTE — Progress Notes (Signed)
Defib check in clinic. All functions normal. Changed VF NID from 8 to 24, changed VT NID from 16 to 30. No other changes made, full details in PaceArt. ROV w/ device clinic in 3 mo  Deborah [name]

## 2012-11-08 LAB — ICD DEVICE OBSERVATION
DEV-0020ICD: NEGATIVE
DEVICE MODEL ICD: 543269
FVT: 0
HV IMPEDENCE: 43 Ohm
PACEART VT: 0
RV LEAD THRESHOLD: 0.75 V
TOT-0007: 3
TOT-0008: 0
TZAT-0001SLOWVT: 1
TZAT-0004FASTVT: 8
TZAT-0004SLOWVT: 8
TZAT-0012FASTVT: 200 ms
TZAT-0012SLOWVT: 200 ms
TZAT-0018FASTVT: NEGATIVE
TZON-0003FASTVT: 285 ms
TZON-0010FASTVT: 80 ms
TZST-0001FASTVT: 3
TZST-0001FASTVT: 5
TZST-0001SLOWVT: 3
TZST-0001SLOWVT: 4
TZST-0003FASTVT: 25 J
TZST-0003FASTVT: 36 J
TZST-0003SLOWVT: 15 J
TZST-0003SLOWVT: 36 J
VENTRICULAR PACING ICD: 0.18 pct

## 2012-11-22 ENCOUNTER — Ambulatory Visit (INDEPENDENT_AMBULATORY_CARE_PROVIDER_SITE_OTHER): Payer: Medicare PPO | Admitting: *Deleted

## 2012-11-22 DIAGNOSIS — Z952 Presence of prosthetic heart valve: Secondary | ICD-10-CM

## 2012-11-22 DIAGNOSIS — I059 Rheumatic mitral valve disease, unspecified: Secondary | ICD-10-CM

## 2012-11-22 DIAGNOSIS — Z7901 Long term (current) use of anticoagulants: Secondary | ICD-10-CM

## 2012-11-22 DIAGNOSIS — Z954 Presence of other heart-valve replacement: Secondary | ICD-10-CM

## 2012-11-22 LAB — POCT INR: INR: 3.2

## 2012-12-20 ENCOUNTER — Ambulatory Visit (INDEPENDENT_AMBULATORY_CARE_PROVIDER_SITE_OTHER): Payer: Medicare PPO

## 2012-12-20 DIAGNOSIS — Z7901 Long term (current) use of anticoagulants: Secondary | ICD-10-CM

## 2012-12-20 DIAGNOSIS — Z954 Presence of other heart-valve replacement: Secondary | ICD-10-CM

## 2012-12-20 DIAGNOSIS — I059 Rheumatic mitral valve disease, unspecified: Secondary | ICD-10-CM

## 2012-12-20 DIAGNOSIS — Z952 Presence of prosthetic heart valve: Secondary | ICD-10-CM

## 2012-12-24 ENCOUNTER — Encounter: Payer: Self-pay | Admitting: Internal Medicine

## 2013-01-17 ENCOUNTER — Ambulatory Visit (INDEPENDENT_AMBULATORY_CARE_PROVIDER_SITE_OTHER): Payer: Medicare PPO | Admitting: General Practice

## 2013-01-17 DIAGNOSIS — I059 Rheumatic mitral valve disease, unspecified: Secondary | ICD-10-CM

## 2013-01-17 DIAGNOSIS — Z954 Presence of other heart-valve replacement: Secondary | ICD-10-CM

## 2013-01-17 DIAGNOSIS — Z7901 Long term (current) use of anticoagulants: Secondary | ICD-10-CM

## 2013-01-17 DIAGNOSIS — Z952 Presence of prosthetic heart valve: Secondary | ICD-10-CM

## 2013-01-17 LAB — POCT INR: INR: 2.4

## 2013-02-13 ENCOUNTER — Ambulatory Visit (INDEPENDENT_AMBULATORY_CARE_PROVIDER_SITE_OTHER): Payer: Medicare PPO | Admitting: *Deleted

## 2013-02-13 DIAGNOSIS — I428 Other cardiomyopathies: Secondary | ICD-10-CM

## 2013-02-13 DIAGNOSIS — Z952 Presence of prosthetic heart valve: Secondary | ICD-10-CM

## 2013-02-13 DIAGNOSIS — I059 Rheumatic mitral valve disease, unspecified: Secondary | ICD-10-CM

## 2013-02-13 DIAGNOSIS — Z954 Presence of other heart-valve replacement: Secondary | ICD-10-CM

## 2013-02-13 DIAGNOSIS — Z7901 Long term (current) use of anticoagulants: Secondary | ICD-10-CM

## 2013-02-13 LAB — ICD DEVICE OBSERVATION
BATTERY VOLTAGE: 2.6771 V
BRDY-0002RV: 40 {beats}/min
DEV-0020ICD: NEGATIVE
FVT: 0
PACEART VT: 0
RV LEAD THRESHOLD: 0.75 V
TOT-0009: 1
TZAT-0001FASTVT: 1
TZAT-0001SLOWVT: 1
TZAT-0004FASTVT: 8
TZAT-0012FASTVT: 200 ms
TZAT-0012SLOWVT: 200 ms
TZAT-0013SLOWVT: 3
TZAT-0020SLOWVT: 1 ms
TZON-0004FASTVT: 30
TZON-0005SLOWVT: 6
TZON-0010FASTVT: 80 ms
TZST-0001FASTVT: 3
TZST-0001FASTVT: 4
TZST-0001FASTVT: 5
TZST-0001SLOWVT: 2
TZST-0001SLOWVT: 4
TZST-0001SLOWVT: 5
TZST-0003FASTVT: 25 J
TZST-0003SLOWVT: 15 J
VENTRICULAR PACING ICD: 0.88 pct

## 2013-02-13 LAB — POCT INR: INR: 4

## 2013-02-13 NOTE — Progress Notes (Signed)
ICD check in clinic. Battery longevity 3.8 years. Normal device function. No episodes recorded. ROV in 3 mths w/SK.

## 2013-02-27 ENCOUNTER — Ambulatory Visit (INDEPENDENT_AMBULATORY_CARE_PROVIDER_SITE_OTHER): Payer: Medicare PPO | Admitting: *Deleted

## 2013-02-27 DIAGNOSIS — Z954 Presence of other heart-valve replacement: Secondary | ICD-10-CM

## 2013-02-27 DIAGNOSIS — I059 Rheumatic mitral valve disease, unspecified: Secondary | ICD-10-CM

## 2013-02-27 DIAGNOSIS — Z952 Presence of prosthetic heart valve: Secondary | ICD-10-CM

## 2013-02-27 DIAGNOSIS — Z7901 Long term (current) use of anticoagulants: Secondary | ICD-10-CM

## 2013-03-14 ENCOUNTER — Ambulatory Visit (INDEPENDENT_AMBULATORY_CARE_PROVIDER_SITE_OTHER): Payer: Medicare PPO | Admitting: *Deleted

## 2013-03-14 DIAGNOSIS — Z952 Presence of prosthetic heart valve: Secondary | ICD-10-CM

## 2013-03-14 DIAGNOSIS — Z954 Presence of other heart-valve replacement: Secondary | ICD-10-CM

## 2013-03-14 DIAGNOSIS — I059 Rheumatic mitral valve disease, unspecified: Secondary | ICD-10-CM

## 2013-03-14 DIAGNOSIS — Z7901 Long term (current) use of anticoagulants: Secondary | ICD-10-CM

## 2013-03-14 LAB — POCT INR: INR: 2.5

## 2013-03-18 ENCOUNTER — Other Ambulatory Visit (HOSPITAL_COMMUNITY): Payer: Self-pay | Admitting: Cardiology

## 2013-03-18 DIAGNOSIS — I5022 Chronic systolic (congestive) heart failure: Secondary | ICD-10-CM

## 2013-03-19 ENCOUNTER — Ambulatory Visit (HOSPITAL_BASED_OUTPATIENT_CLINIC_OR_DEPARTMENT_OTHER)
Admission: RE | Admit: 2013-03-19 | Discharge: 2013-03-19 | Disposition: A | Discharge status: Home / Self Care | Payer: Medicare PPO | Source: Ambulatory Visit | Attending: Internal Medicine | Admitting: Internal Medicine

## 2013-03-19 ENCOUNTER — Ambulatory Visit (HOSPITAL_COMMUNITY)
Admission: RE | Admit: 2013-03-19 | Discharge: 2013-03-19 | Disposition: A | Discharge status: Home / Self Care | Payer: Medicare PPO | Source: Ambulatory Visit | Attending: Pulmonary Disease | Admitting: Pulmonary Disease

## 2013-03-19 ENCOUNTER — Encounter (HOSPITAL_COMMUNITY): Payer: Self-pay

## 2013-03-19 VITALS — BP 122/64 | HR 55 | Wt 164.8 lb

## 2013-03-19 DIAGNOSIS — E785 Hyperlipidemia, unspecified: Secondary | ICD-10-CM | POA: Insufficient documentation

## 2013-03-19 DIAGNOSIS — Z954 Presence of other heart-valve replacement: Secondary | ICD-10-CM | POA: Insufficient documentation

## 2013-03-19 DIAGNOSIS — N200 Calculus of kidney: Secondary | ICD-10-CM | POA: Insufficient documentation

## 2013-03-19 DIAGNOSIS — I369 Nonrheumatic tricuspid valve disorder, unspecified: Secondary | ICD-10-CM

## 2013-03-19 DIAGNOSIS — I5022 Chronic systolic (congestive) heart failure: Secondary | ICD-10-CM

## 2013-03-19 DIAGNOSIS — I10 Essential (primary) hypertension: Secondary | ICD-10-CM | POA: Insufficient documentation

## 2013-03-19 DIAGNOSIS — I4949 Other premature depolarization: Secondary | ICD-10-CM | POA: Insufficient documentation

## 2013-03-19 DIAGNOSIS — Z79899 Other long term (current) drug therapy: Secondary | ICD-10-CM | POA: Insufficient documentation

## 2013-03-19 DIAGNOSIS — I509 Heart failure, unspecified: Secondary | ICD-10-CM | POA: Insufficient documentation

## 2013-03-19 DIAGNOSIS — Z7901 Long term (current) use of anticoagulants: Secondary | ICD-10-CM | POA: Insufficient documentation

## 2013-03-19 DIAGNOSIS — E119 Type 2 diabetes mellitus without complications: Secondary | ICD-10-CM | POA: Insufficient documentation

## 2013-03-19 NOTE — Progress Notes (Signed)
*  PRELIMINARY RESULTS* Echocardiogram 2D Echocardiogram has been performed.  Deborah [name] 03/19/2013, 1:48 PM

## 2013-03-19 NOTE — Progress Notes (Signed)
Patient ID: Deborah [name], female   DOB: [birth year], 67 y.o.   MRN: [id number] PCP: Deborah [name]  HPI: Deborah [name] is a very pleasant 67 year old woman with a history of congestive heart failure secondary to nonischemic cardiomyopathy, ejection fraction 25% s/p St Jude single chamber ICD.  She also has a history of mitral valve disease and status post mitral valve replacement with a St. Jude valve in 2005.  Remainder of her history is notable for diabetes, hypertension, hyperlipidemia, and kidney stones.   CPX performed June 2011 pVO2 16.8 slope 26.1 RER 1.11 O2 pulse 90% predicted O2 sat 87% at peak with possible hypoventilation.  05/12/11 ECHO 20-25%.  Mechanical MVR appears normal.   03/19/13 Echo 25% Mechanical MVR appears normal.    She is for follow up today.   Says she is doing fine. Able to do all her activities without any problem. Denies orthopnea, PND, dyspnea or edema. (except in R ankle that she broke.) No problems with coumadin.   ROS: All systems negative except as listed in HPI, PMH and Problem List.  Past Medical History  Diagnosis Date  . CHF (congestive heart failure)     --Non-ischemic CM EF 30-35% by echo 9/09 (Previous 25%)  --s/p St. Jude single chamber ICD 04/09 --Minimal  CAD by cath 2004 LAD 20% LCX 20% Ramus 30% RCA nl --echo 05/11 EF 20-25%  . MR (congenital mitral regurgitation)     Severe MR s/p St. Jude mechanical MVR by Gasper Lloyd 2005  . Aortic stenosis, mild     mean gradient echo 09/09  . Diabetes mellitus     type 2  . Kidney stones   . Hyperlipidemia   . Hypertension     Current Outpatient Prescriptions  Medication Sig Dispense Refill  . atorvastatin (LIPITOR) 40 MG tablet Take 1 tablet (40 mg total) by mouth daily.  90 tablet  1  . carvedilol (COREG) 25 MG tablet Take 1 tablet (25 mg total) by mouth 2 (two) times daily with a meal.  180 tablet  1  . Choline Fenofibrate (TRILIPIX) 135 MG capsule Take 1 capsule (135 mg total) by mouth daily.  90  capsule  1  . digoxin (LANOXIN) 0.125 MG tablet Take 1 tablet (125 mcg total) by mouth daily.  90 tablet  1  . fish oil-omega-3 fatty acids 1000 MG capsule Take by mouth daily.       Marland Kitchen HYDROcodone-acetaminophen (VICODIN) 5-500 MG per tablet Take 1 tablet by mouth as needed.        . metFORMIN (GLUCOPHAGE) 1000 MG tablet Take 1,000 mg by mouth 2 (two) times daily with a meal.        . pantoprazole (PROTONIX) 40 MG tablet Take 1 tablet (40 mg total) by mouth daily.  30 tablet  11  . quinapril (ACCUPRIL) 40 MG tablet Take 1 tablet (40 mg total) by mouth daily.  90 tablet  1  . spironolactone (ALDACTONE) 25 MG tablet Take 1 tablet (25 mg total) by mouth daily.  90 tablet  1  . warfarin (COUMADIN) 5 MG tablet Take 5-7.5 mg by mouth daily. Takes 5 mg on Monday, Wednesday and Friday and 7.5 mg all other days       No current facility-administered medications for this encounter.     PHYSICAL EXAM: Filed Vitals:   03/19/13 1341  BP: 122/64  Pulse: 55  Weight: 164 lb 12.8 oz (74.753 kg)  SpO2: 100%   General:    Well  appearing no distress HEENT Normal Neck: JVP 5-6. carotids were brisk. no bruits. No lymphadenopathy or thyromegaly. Lungs clear  Cor: RRR. Heart sounds were mildly irregular with mechanical s1. No murmur Abdomen was soft with active bowel sounds.Nontender There is no clubbing cyanosis.  No edema Skin Warm and dry Neuro  oriented x 3  cranial nerves II-XII grossly intact. moves all 4 extremities without difficulty.  ECG: NSR with PVCs    ASSESSMENT & PLAN:  1. Chronic Systolic Heart Failure - Stable NYHA I-II. Volume status stable. Continue current excellent medical regimen - Echo reviewed personally in Clinic. No change from previous.   2. S/P MVR - on coumadin -stable by echo  3. HTN -Blood pressure well controlled. Continue current regimen.  Tawan Corkern,MD 1:59 PM

## 2013-03-21 ENCOUNTER — Encounter: Payer: Self-pay | Admitting: Internal Medicine

## 2013-03-21 NOTE — Addendum Note (Signed)
Encounter addended by: Zynia Wojtowicz H Devra Stare, CCT on: 03/21/2013  9:11 AM<BR>     Documentation filed: Charges VN

## 2013-04-10 ENCOUNTER — Ambulatory Visit (INDEPENDENT_AMBULATORY_CARE_PROVIDER_SITE_OTHER): Payer: Medicare PPO | Admitting: *Deleted

## 2013-04-10 DIAGNOSIS — I059 Rheumatic mitral valve disease, unspecified: Secondary | ICD-10-CM

## 2013-04-10 DIAGNOSIS — Z7901 Long term (current) use of anticoagulants: Secondary | ICD-10-CM

## 2013-04-10 DIAGNOSIS — Z952 Presence of prosthetic heart valve: Secondary | ICD-10-CM

## 2013-04-10 DIAGNOSIS — Z954 Presence of other heart-valve replacement: Secondary | ICD-10-CM

## 2013-05-01 ENCOUNTER — Ambulatory Visit (INDEPENDENT_AMBULATORY_CARE_PROVIDER_SITE_OTHER): Payer: Medicare PPO | Admitting: *Deleted

## 2013-05-01 DIAGNOSIS — Z952 Presence of prosthetic heart valve: Secondary | ICD-10-CM

## 2013-05-01 DIAGNOSIS — Z7901 Long term (current) use of anticoagulants: Secondary | ICD-10-CM

## 2013-05-01 DIAGNOSIS — Z954 Presence of other heart-valve replacement: Secondary | ICD-10-CM

## 2013-05-01 DIAGNOSIS — I059 Rheumatic mitral valve disease, unspecified: Secondary | ICD-10-CM

## 2013-05-01 LAB — POCT INR: INR: 2.7

## 2013-05-20 ENCOUNTER — Ambulatory Visit (INDEPENDENT_AMBULATORY_CARE_PROVIDER_SITE_OTHER): Payer: Medicare PPO | Admitting: *Deleted

## 2013-05-20 DIAGNOSIS — I428 Other cardiomyopathies: Secondary | ICD-10-CM

## 2013-05-20 DIAGNOSIS — I5022 Chronic systolic (congestive) heart failure: Secondary | ICD-10-CM

## 2013-05-20 LAB — MDC_IDC_ENUM_SESS_TYPE_INCLINIC
Battery Remaining Longevity: 44.4 mo
Battery Voltage: 2.65 V
Date Time Interrogation Session: 20150209120848
HIGH POWER IMPEDANCE MEASURED VALUE: 41.7582
Implantable Pulse Generator Serial Number: 543269
Lead Channel Impedance Value: 400 Ohm
Lead Channel Pacing Threshold Amplitude: 0.75 V
Lead Channel Sensing Intrinsic Amplitude: 10.8 mV
Lead Channel Setting Pacing Amplitude: 2.5 V
Lead Channel Setting Pacing Pulse Width: 0.5 ms
MDC IDC MSMT LEADCHNL RV PACING THRESHOLD AMPLITUDE: 0.75 V
MDC IDC MSMT LEADCHNL RV PACING THRESHOLD PULSEWIDTH: 0.5 ms
MDC IDC MSMT LEADCHNL RV PACING THRESHOLD PULSEWIDTH: 0.5 ms
MDC IDC SET LEADCHNL RV SENSING SENSITIVITY: 0.3 mV
MDC IDC SET ZONE DETECTION INTERVAL: 250 ms
MDC IDC SET ZONE DETECTION INTERVAL: 350 ms
MDC IDC STAT BRADY RV PERCENT PACED: 0.23 %
Zone Setting Detection Interval: 285 ms

## 2013-05-20 NOTE — Progress Notes (Signed)
ICD check in clinic. Normal device function. Thresholds and sensing consistent with previous device measurements. Impedance trends stable over time. No evidence of any ventricular arrhythmias. Histogram distribution appropriate for patient and level of activity. No changes made this session. Device programmed at appropriate safety margins. Device programmed to optimize intrinsic conduction. Estimated longevity 3.48yrs. Alert vibration demonstrated for patient, pt knows to call if felt. ROV w/ SK 09/04/13.

## 2013-05-30 ENCOUNTER — Other Ambulatory Visit (HOSPITAL_COMMUNITY): Payer: Self-pay | Admitting: *Deleted

## 2013-05-30 MED ORDER — SPIRONOLACTONE 25 MG PO TABS: 25.0000 mg | ORAL_TABLET | Freq: Every day | ORAL | Status: DC

## 2013-06-03 ENCOUNTER — Ambulatory Visit (INDEPENDENT_AMBULATORY_CARE_PROVIDER_SITE_OTHER): Payer: Medicare PPO | Admitting: Pharmacist

## 2013-06-03 DIAGNOSIS — Z954 Presence of other heart-valve replacement: Secondary | ICD-10-CM

## 2013-06-03 DIAGNOSIS — I059 Rheumatic mitral valve disease, unspecified: Secondary | ICD-10-CM

## 2013-06-03 DIAGNOSIS — Z952 Presence of prosthetic heart valve: Secondary | ICD-10-CM

## 2013-06-03 DIAGNOSIS — Z7901 Long term (current) use of anticoagulants: Secondary | ICD-10-CM

## 2013-06-03 LAB — POCT INR: INR: 4.1

## 2013-06-18 ENCOUNTER — Ambulatory Visit (INDEPENDENT_AMBULATORY_CARE_PROVIDER_SITE_OTHER): Payer: Medicare PPO | Admitting: Pharmacist

## 2013-06-18 DIAGNOSIS — Z952 Presence of prosthetic heart valve: Secondary | ICD-10-CM

## 2013-06-18 DIAGNOSIS — I059 Rheumatic mitral valve disease, unspecified: Secondary | ICD-10-CM

## 2013-06-18 DIAGNOSIS — Z954 Presence of other heart-valve replacement: Secondary | ICD-10-CM

## 2013-06-18 DIAGNOSIS — Z7901 Long term (current) use of anticoagulants: Secondary | ICD-10-CM

## 2013-06-18 LAB — POCT INR: INR: 3.1

## 2013-06-24 ENCOUNTER — Encounter: Payer: Self-pay | Admitting: Internal Medicine

## 2013-07-10 ENCOUNTER — Ambulatory Visit (INDEPENDENT_AMBULATORY_CARE_PROVIDER_SITE_OTHER): Payer: Medicare PPO | Admitting: *Deleted

## 2013-07-10 DIAGNOSIS — I059 Rheumatic mitral valve disease, unspecified: Secondary | ICD-10-CM

## 2013-07-10 DIAGNOSIS — Z7901 Long term (current) use of anticoagulants: Secondary | ICD-10-CM

## 2013-07-10 DIAGNOSIS — Z954 Presence of other heart-valve replacement: Secondary | ICD-10-CM

## 2013-07-10 DIAGNOSIS — Z952 Presence of prosthetic heart valve: Secondary | ICD-10-CM

## 2013-07-10 LAB — POCT INR: INR: 1.7

## 2013-07-22 ENCOUNTER — Ambulatory Visit (INDEPENDENT_AMBULATORY_CARE_PROVIDER_SITE_OTHER): Payer: Medicare PPO | Admitting: *Deleted

## 2013-07-22 DIAGNOSIS — Z5181 Encounter for therapeutic drug level monitoring: Secondary | ICD-10-CM

## 2013-07-22 DIAGNOSIS — Z952 Presence of prosthetic heart valve: Secondary | ICD-10-CM

## 2013-07-22 DIAGNOSIS — Z954 Presence of other heart-valve replacement: Secondary | ICD-10-CM

## 2013-07-22 DIAGNOSIS — I059 Rheumatic mitral valve disease, unspecified: Secondary | ICD-10-CM

## 2013-07-22 DIAGNOSIS — Z7901 Long term (current) use of anticoagulants: Secondary | ICD-10-CM

## 2013-07-22 LAB — POCT INR: INR: 3.9

## 2013-08-05 ENCOUNTER — Ambulatory Visit (INDEPENDENT_AMBULATORY_CARE_PROVIDER_SITE_OTHER): Payer: Medicare PPO | Admitting: *Deleted

## 2013-08-05 DIAGNOSIS — Z5181 Encounter for therapeutic drug level monitoring: Secondary | ICD-10-CM

## 2013-08-05 DIAGNOSIS — Z7901 Long term (current) use of anticoagulants: Secondary | ICD-10-CM

## 2013-08-05 DIAGNOSIS — Z952 Presence of prosthetic heart valve: Secondary | ICD-10-CM

## 2013-08-05 DIAGNOSIS — Z954 Presence of other heart-valve replacement: Secondary | ICD-10-CM

## 2013-08-05 DIAGNOSIS — I059 Rheumatic mitral valve disease, unspecified: Secondary | ICD-10-CM

## 2013-08-05 LAB — POCT INR: INR: 4.2

## 2013-08-19 ENCOUNTER — Ambulatory Visit (INDEPENDENT_AMBULATORY_CARE_PROVIDER_SITE_OTHER): Payer: Medicare PPO

## 2013-08-19 DIAGNOSIS — Z5181 Encounter for therapeutic drug level monitoring: Secondary | ICD-10-CM

## 2013-08-19 DIAGNOSIS — I059 Rheumatic mitral valve disease, unspecified: Secondary | ICD-10-CM

## 2013-08-19 DIAGNOSIS — Z954 Presence of other heart-valve replacement: Secondary | ICD-10-CM

## 2013-08-19 DIAGNOSIS — Z7901 Long term (current) use of anticoagulants: Secondary | ICD-10-CM

## 2013-08-19 DIAGNOSIS — Z952 Presence of prosthetic heart valve: Secondary | ICD-10-CM

## 2013-08-19 LAB — POCT INR: INR: 3

## 2013-09-04 ENCOUNTER — Encounter: Payer: Self-pay | Admitting: Internal Medicine

## 2013-09-04 ENCOUNTER — Ambulatory Visit (INDEPENDENT_AMBULATORY_CARE_PROVIDER_SITE_OTHER): Payer: Medicare PPO | Admitting: Pharmacist

## 2013-09-04 ENCOUNTER — Ambulatory Visit (INDEPENDENT_AMBULATORY_CARE_PROVIDER_SITE_OTHER): Payer: Medicare PPO | Admitting: Internal Medicine

## 2013-09-04 VITALS — BP 148/76 | HR 67 | Ht 63.0 in | Wt 156.0 lb

## 2013-09-04 DIAGNOSIS — Z954 Presence of other heart-valve replacement: Secondary | ICD-10-CM

## 2013-09-04 DIAGNOSIS — Z95810 Presence of automatic (implantable) cardiac defibrillator: Secondary | ICD-10-CM

## 2013-09-04 DIAGNOSIS — Z952 Presence of prosthetic heart valve: Secondary | ICD-10-CM

## 2013-09-04 DIAGNOSIS — Z7901 Long term (current) use of anticoagulants: Secondary | ICD-10-CM

## 2013-09-04 DIAGNOSIS — Z5181 Encounter for therapeutic drug level monitoring: Secondary | ICD-10-CM

## 2013-09-04 DIAGNOSIS — I5022 Chronic systolic (congestive) heart failure: Secondary | ICD-10-CM

## 2013-09-04 DIAGNOSIS — I059 Rheumatic mitral valve disease, unspecified: Secondary | ICD-10-CM

## 2013-09-04 DIAGNOSIS — I428 Other cardiomyopathies: Secondary | ICD-10-CM

## 2013-09-04 LAB — MDC_IDC_ENUM_SESS_TYPE_INCLINIC
Battery Remaining Longevity: 43.2 mo
Battery Voltage: 2.62 V
Date Time Interrogation Session: 20150527120026
HIGH POWER IMPEDANCE MEASURED VALUE: 43.2 Ohm
Implantable Pulse Generator Serial Number: 543269
Lead Channel Impedance Value: 437.5 Ohm
Lead Channel Pacing Threshold Amplitude: 0.75 V
Lead Channel Pacing Threshold Amplitude: 0.75 V
Lead Channel Sensing Intrinsic Amplitude: 12 mV
Lead Channel Setting Pacing Pulse Width: 0.5 ms
Lead Channel Setting Sensing Sensitivity: 0.3 mV
MDC IDC MSMT LEADCHNL RV PACING THRESHOLD PULSEWIDTH: 0.5 ms
MDC IDC MSMT LEADCHNL RV PACING THRESHOLD PULSEWIDTH: 0.5 ms
MDC IDC SET LEADCHNL RV PACING AMPLITUDE: 2.5 V
MDC IDC SET ZONE DETECTION INTERVAL: 250 ms
MDC IDC STAT BRADY RV PERCENT PACED: 0.46 %
Zone Setting Detection Interval: 285 ms
Zone Setting Detection Interval: 350 ms

## 2013-09-04 LAB — BASIC METABOLIC PANEL
BUN: 26 mg/dL — AB (ref 6–23)
CHLORIDE: 109 meq/L (ref 96–112)
CO2: 25 meq/L (ref 19–32)
Calcium: 9.3 mg/dL (ref 8.4–10.5)
Creatinine, Ser: 1.6 mg/dL — ABNORMAL HIGH (ref 0.4–1.2)
GFR: 40.69 mL/min — ABNORMAL LOW (ref >60.00)
GLUCOSE: 90 mg/dL (ref 70–99)
Potassium: 4.5 mEq/L (ref 3.5–5.1)
Sodium: 142 mEq/L (ref 135–145)

## 2013-09-04 LAB — POCT INR: INR: 2.6

## 2013-09-04 MED ORDER — ASPIRIN EC 81 MG PO TBEC
81.0000 mg | DELAYED_RELEASE_TABLET | Freq: Every day | ORAL | Status: DC
Start: 2013-09-04 — End: 2014-12-06

## 2013-09-04 NOTE — Progress Notes (Signed)
Dr. Graciela Husbands spoke with Dr. Gala Romney about starting low dose ASA - ok to proceed.

## 2013-09-04 NOTE — Addendum Note (Signed)
Addended by: Baird Lyons on: 09/04/2013 06:05 PM   Modules accepted: Orders

## 2013-09-04 NOTE — Progress Notes (Signed)
Patient Care Team: Eino FarberGeorge R Kilpatrick Jr., MD as PCP - General (Pulmonary Disease) Eino FarberGeorge R Kilpatrick Jr., MD (Pulmonary Disease) Dolores Pattyaniel R Bensimhon, MD (Cardiology) Duke SalviaSteven C Klein, MD (Cardiology)   HPI  Deborah ReichertJanice F [name] is a 68 y.o. female Seen in followup for ICD implantation for primary prevention with a history of congestive heart failure, secondary to nonischemic cardiomyopathy.  Echo 12/14 ejection fraction around 25%  She also has a history of mitral valve disease and status post mitral valve replacement with a St. Jude valve in 2005    Denies chest pain shortness of breath orthopnea or peripheral edema.  Past Medical History  Diagnosis Date  . CHF (congestive heart failure)     --Non-ischemic CM EF 30-35% by echo 9/09 (Previous 25%)  --s/p St. Jude single chamber ICD 04/09 --Minimal  CAD by cath 2004 LAD 20% LCX 20% Ramus 30% RCA nl --echo 05/11 EF 20-25%  . MR (congenital mitral regurgitation)     Severe MR s/p St. Jude mechanical MVR by Gasper Lloydon Glower 2005  . Aortic stenosis, mild     mean gradient 10mmHg echo 09/09  . Diabetes mellitus     type 2  . Kidney stones   . Hyperlipidemia   . Hypertension     Past Surgical History  Procedure Laterality Date  . Appendectomy  1979  . Cardiac defibrillator placement  2009  . Insert / replace / remove pacemaker    . Cardiac valve surgery  2005    Current Outpatient Prescriptions  Medication Sig Dispense Refill  . atorvastatin (LIPITOR) 40 MG tablet Take 1 tablet (40 mg total) by mouth daily.  90 tablet  1  . carvedilol (COREG) 25 MG tablet Take 1 tablet (25 mg total) by mouth 2 (two) times daily with a meal.  180 tablet  1  . Choline Fenofibrate (TRILIPIX) 135 MG capsule Take 1 capsule (135 mg total) by mouth daily.  90 capsule  1  . digoxin (LANOXIN) 0.125 MG tablet Take 1 tablet (125 mcg total) by mouth daily.  90 tablet  1  . fish oil-omega-3 fatty acids 1000 MG capsule Take by mouth daily.       Marland Kitchen.  HYDROcodone-acetaminophen (VICODIN) 5-500 MG per tablet Take 1 tablet by mouth as needed.        . metFORMIN (GLUCOPHAGE) 1000 MG tablet Take 1,000 mg by mouth 2 (two) times daily with a meal.        . pantoprazole (PROTONIX) 40 MG tablet Take 1 tablet (40 mg total) by mouth daily.  30 tablet  11  . quinapril (ACCUPRIL) 40 MG tablet Take 1 tablet (40 mg total) by mouth daily.  90 tablet  1  . spironolactone (ALDACTONE) 25 MG tablet Take 1 tablet (25 mg total) by mouth daily.  90 tablet  1  . warfarin (COUMADIN) 5 MG tablet Take 5-7.5 mg by mouth daily. Takes 5 mg on Monday, Wednesday and Friday and 7.5 mg all other days       No current facility-administered medications for this visit.    Allergies  Allergen Reactions  . Strawberry     Rash & itching.    Review of Systems negative except from HPI and PMH  Physical Exam BP 148/76  Pulse 67  Ht 5\' 3"  (1.6 m)  Wt 156 lb (70.761 kg)  BMI 27.64 kg/m2 Well developed and well nourished in no acute distress HENT normal E scleral and icterus clear Neck Supple  JVP flat; carotids brisk and full Clear to ausculation  *Regular rate and rhythm, mechanical S1 and 2/6 murmur  Soft with active bowel sounds No clubbing cyanosis  Edema Alert and oriented, grossly normal motor and sensory function Skin Warm and Dry ECG demonstrates NSR    Assessment and  Plan  Valve replacement-mitral-mechanical  Nonischemic cardiomyopathy  Hypertension  Implantable defibrillator-St. Jude  To his defibrillator is working appropriately.  I  concerned that she is not on aspirin adjunctively with her warfarin for mechanical mitral valve. I will check with Dr. Dorthea Cove.  We will check her potassium level and digoxin level given her multiple medications

## 2013-09-04 NOTE — Patient Instructions (Addendum)
Your physician has recommended you make the following change in your medication:  1) START low dose Aspirin 81 mg   Labs today: BMET, Digoxin level  Remote monitoring is used to monitor your Pacemaker of ICD from home. This monitoring reduces the number of office visits required to check your device to one time per year. It allows [place] to keep an eye on the functioning of your device to ensure it is working properly. You are scheduled for a device check from home on 12/10/2013. You may send your transmission at any time that day. If you have a wireless device, the transmission will be sent automatically. After your physician reviews your transmission, you will receive a postcard with your next transmission date.  Your physician wants you to follow-up in: 1 year with Dr. Graciela Husbands.  You will receive a reminder letter in the mail two months in advance. If you don't receive a letter, please call our office to schedule the follow-up appointment.

## 2013-09-05 LAB — DIGOXIN LEVEL: DIGOXIN LVL: 1.5 ng/mL (ref 0.8–2.0)

## 2013-10-02 ENCOUNTER — Ambulatory Visit (INDEPENDENT_AMBULATORY_CARE_PROVIDER_SITE_OTHER): Payer: Medicare PPO | Admitting: Pharmacist

## 2013-10-02 DIAGNOSIS — I059 Rheumatic mitral valve disease, unspecified: Secondary | ICD-10-CM

## 2013-10-02 DIAGNOSIS — Z952 Presence of prosthetic heart valve: Secondary | ICD-10-CM

## 2013-10-02 DIAGNOSIS — Z5181 Encounter for therapeutic drug level monitoring: Secondary | ICD-10-CM

## 2013-10-02 DIAGNOSIS — Z7901 Long term (current) use of anticoagulants: Secondary | ICD-10-CM

## 2013-10-02 DIAGNOSIS — Z954 Presence of other heart-valve replacement: Secondary | ICD-10-CM

## 2013-10-02 LAB — POCT INR: INR: 2.2

## 2013-10-09 ENCOUNTER — Encounter (HOSPITAL_COMMUNITY): Payer: Self-pay | Admitting: Cardiology

## 2013-10-24 ENCOUNTER — Ambulatory Visit (INDEPENDENT_AMBULATORY_CARE_PROVIDER_SITE_OTHER): Payer: Medicare PPO | Admitting: *Deleted

## 2013-10-24 DIAGNOSIS — Z952 Presence of prosthetic heart valve: Secondary | ICD-10-CM

## 2013-10-24 DIAGNOSIS — Z5181 Encounter for therapeutic drug level monitoring: Secondary | ICD-10-CM

## 2013-10-24 DIAGNOSIS — Z7901 Long term (current) use of anticoagulants: Secondary | ICD-10-CM

## 2013-10-24 DIAGNOSIS — I059 Rheumatic mitral valve disease, unspecified: Secondary | ICD-10-CM

## 2013-10-24 DIAGNOSIS — Z954 Presence of other heart-valve replacement: Secondary | ICD-10-CM

## 2013-10-24 LAB — POCT INR: INR: 2.2

## 2013-11-07 ENCOUNTER — Ambulatory Visit (INDEPENDENT_AMBULATORY_CARE_PROVIDER_SITE_OTHER): Payer: Medicare PPO | Admitting: Pharmacist

## 2013-11-07 DIAGNOSIS — Z954 Presence of other heart-valve replacement: Secondary | ICD-10-CM

## 2013-11-07 DIAGNOSIS — Z5181 Encounter for therapeutic drug level monitoring: Secondary | ICD-10-CM

## 2013-11-07 DIAGNOSIS — Z952 Presence of prosthetic heart valve: Secondary | ICD-10-CM

## 2013-11-07 DIAGNOSIS — Z7901 Long term (current) use of anticoagulants: Secondary | ICD-10-CM

## 2013-11-07 DIAGNOSIS — I059 Rheumatic mitral valve disease, unspecified: Secondary | ICD-10-CM

## 2013-11-07 LAB — POCT INR: INR: 2.5

## 2013-11-19 ENCOUNTER — Ambulatory Visit (HOSPITAL_COMMUNITY)
Admission: RE | Admit: 2013-11-19 | Discharge: 2013-11-19 | Disposition: A | Discharge status: Home / Self Care | Payer: Medicare PPO | Source: Ambulatory Visit | Attending: Internal Medicine | Admitting: Internal Medicine

## 2013-11-19 ENCOUNTER — Encounter (HOSPITAL_COMMUNITY): Payer: Self-pay

## 2013-11-19 VITALS — BP 126/70 | HR 70 | Wt 157.0 lb

## 2013-11-19 DIAGNOSIS — E119 Type 2 diabetes mellitus without complications: Secondary | ICD-10-CM | POA: Diagnosis not present

## 2013-11-19 DIAGNOSIS — I509 Heart failure, unspecified: Secondary | ICD-10-CM | POA: Diagnosis not present

## 2013-11-19 DIAGNOSIS — R944 Abnormal results of kidney function studies: Secondary | ICD-10-CM | POA: Insufficient documentation

## 2013-11-19 DIAGNOSIS — Z7982 Long term (current) use of aspirin: Secondary | ICD-10-CM | POA: Insufficient documentation

## 2013-11-19 DIAGNOSIS — Z7901 Long term (current) use of anticoagulants: Secondary | ICD-10-CM | POA: Diagnosis not present

## 2013-11-19 DIAGNOSIS — I359 Nonrheumatic aortic valve disorder, unspecified: Secondary | ICD-10-CM | POA: Diagnosis not present

## 2013-11-19 DIAGNOSIS — I10 Essential (primary) hypertension: Secondary | ICD-10-CM | POA: Diagnosis not present

## 2013-11-19 DIAGNOSIS — I059 Rheumatic mitral valve disease, unspecified: Secondary | ICD-10-CM

## 2013-11-19 DIAGNOSIS — I5022 Chronic systolic (congestive) heart failure: Secondary | ICD-10-CM

## 2013-11-19 DIAGNOSIS — Z954 Presence of other heart-valve replacement: Secondary | ICD-10-CM | POA: Diagnosis not present

## 2013-11-19 DIAGNOSIS — E785 Hyperlipidemia, unspecified: Secondary | ICD-10-CM | POA: Insufficient documentation

## 2013-11-19 LAB — BASIC METABOLIC PANEL
ANION GAP: 13 (ref 5–15)
BUN: 19 mg/dL (ref 6–23)
CALCIUM: 9 mg/dL (ref 8.4–10.5)
CO2: 23 meq/L (ref 19–32)
Chloride: 109 mEq/L (ref 96–112)
Creatinine, Ser: 1.05 mg/dL (ref 0.50–1.10)
GFR calc Af Amer: 61 mL/min — ABNORMAL LOW (ref >90)
GFR calc non Af Amer: 53 mL/min — ABNORMAL LOW (ref >90)
Glucose, Bld: 91 mg/dL (ref 70–99)
POTASSIUM: 4.2 meq/L (ref 3.7–5.3)
Sodium: 145 mEq/L (ref 137–147)

## 2013-11-19 LAB — DIGOXIN LEVEL: Digoxin Level: 0.8 ng/mL (ref 0.8–2.0)

## 2013-11-19 NOTE — Patient Instructions (Signed)
Your physician recommends that you schedule a follow-up appointment in: 6 months with an echocardiogram  Your physician has requested that you have an echocardiogram. Echocardiography is a painless test that uses sound waves to create images of your heart. It provides your doctor with information about the size and shape of your heart and how well your heart's chambers and valves are working. This procedure takes approximately one hour. There are no restrictions for this procedure.  Do the following things EVERYDAY: 1) Weigh yourself in the morning before breakfast. Write it down and keep it in a log. 2) Take your medicines as prescribed 3) Eat low salt foods-Limit salt (sodium) to 2000 mg per day.  4) Stay as active as you can everyday 5) Limit all fluids for the day to less than 2 liters 6)

## 2013-11-19 NOTE — Progress Notes (Signed)
Patient ID: Armando ReichertJanice F Castrogiovanni, female   DOB: 07/04/1945, 68 y.o.   MRN: [id number] PCP: Petra KubaKilpatrick  HPI: Ms. Roney MarionFoust is a very pleasant 68 year old woman with a history of congestive heart failure secondary to nonischemic cardiomyopathy, ejection fraction 25% s/p St Jude single chamber ICD.  She also has a history of mitral valve disease and status post mitral valve replacement with a St. Jude valve in 2005.  Remainder of her history is notable for diabetes, hypertension, hyperlipidemia, and kidney stones.   CPX performed June 2011 pVO2 16.8 slope 26.1 RER 1.11 O2 pulse 90% predicted O2 sat 87% at peak with possible hypoventilation.  05/12/11 ECHO 20-25%.  Mechanical MVR appears normal.   03/19/13 Echo 25% Mechanical MVR appears normal.    She is for follow up today. Denies SOB/PND/Orthopnea. Weight at home 154 pounds. Can walk  6 -7 blocks. No bleeding problems. She has been taking spironolactone as needed. Saw Dr. Graciela HusbandsKlein a few weeks ago and Cr was 1.6 up from 1.0. Has not had it repeated.    ROS: All systems negative except as listed in HPI, PMH and Problem List.  Past Medical History  Diagnosis Date  . CHF (congestive heart failure)     --Non-ischemic CM EF 30-35% by echo 9/09 (Previous 25%)  --s/p St. Jude single chamber ICD 04/09 --Minimal  CAD by cath 2004 LAD 20% LCX 20% Ramus 30% RCA nl --echo 05/11 EF 20-25%  . MR (congenital mitral regurgitation)     Severe MR s/p St. Jude mechanical MVR by Gasper Lloy[facility] Glower 2005  . Aortic stenosis, mild     mean gradient 10mmHg echo 09/09  . Diabetes mellitus     type 2  . Kidney stones   . Hyperlipidemia   . Hypertension     Current Outpatient Prescriptions  Medication Sig Dispense Refill  . aspirin EC 81 MG tablet Take 1 tablet (81 mg total) by mouth daily.  30 tablet  11  . atorvastatin (LIPITOR) 40 MG tablet Take 1 tablet (40 mg total) by mouth daily.  90 tablet  1  . carvedilol (COREG) 25 MG tablet Take 1 tablet (25 mg total) by mouth 2 (two) times  daily with a meal.  180 tablet  1  . Choline Fenofibrate (TRILIPIX) 135 MG capsule Take 1 capsule (135 mg total) by mouth daily.  90 capsule  1  . digoxin (LANOXIN) 0.125 MG tablet Take 1 tablet (125 mcg total) by mouth daily.  90 tablet  1  . fish oil-omega-3 fatty acids 1000 MG capsule Take by mouth daily.       Marland Kitchen. HYDROcodone-acetaminophen (VICODIN) 5-500 MG per tablet Take 1 tablet by mouth as needed.        . metFORMIN (GLUCOPHAGE) 1000 MG tablet Take 1,000 mg by mouth 2 (two) times daily with a meal.        . pantoprazole (PROTONIX) 40 MG tablet Take 1 tablet (40 mg total) by mouth daily.  30 tablet  11  . quinapril (ACCUPRIL) 40 MG tablet Take 1 tablet (40 mg total) by mouth daily.  90 tablet  1  . spironolactone (ALDACTONE) 25 MG tablet Take 1 tablet (25 mg total) by mouth daily.  90 tablet  1  . warfarin (COUMADIN) 5 MG tablet Take 5-7.5 mg by mouth daily. Takes 5 mg on Monday, Wednesday and Friday and 7.5 mg all other days       No current facility-administered medications for this encounter.     PHYSICAL  EXAM: Filed Vitals:   11/19/13 1408  BP: 126/70  Pulse: 70  Weight: 157 lb (71.215 kg)  SpO2: 96%   General:    Well appearing no distress HEENT Normal Neck: JVP 5-6. carotids were brisk. no bruits. No lymphadenopathy or thyromegaly. Lungs clear  Cor: RRR. Heart sounds were mildly irregular with mechanical s1. No murmur Abdomen was soft with active bowel sounds.Nontender There is no clubbing cyanosis.  No edema Skin Warm and dry Neuro  oriented x 3  cranial nerves II-XII grossly intact. moves all 4 extremities without difficulty.  ECG: NSR with PVCs    ASSESSMENT & PLAN:  1. Chronic Systolic Heart Failure ECHO Ef 25% 03/2013  - Stable NYHA I. Doing great.  Volume status stable. Continue 25 mg spironolactone daily.  - On goal dose of coreg and quinapril .  Continue digoxin 0.125 mg daily  Check BMET today.    2. S/P MVR - on coumadin -stable by echo -Reminded of  need for SBE prophylaxis   3. HTN -Blood pressure well controlled. Continue current regimen.  4. Elevated creatinine - this is new for her. Will recheck today  Follow up in 6 months   CLEGG,AMY, NP-C  2:23 PM  Patient seen and examined with Tonye Becket, NP. We discussed all aspects of the encounter. I agree with the assessment and plan as stated above.   Doing very well. NYHA I-II. Volume status looks good. On goal doses of meds. Will continue current regimen. Will recheck BMET and digoxin level today. Reminded of SBE prophylaxis for all procedures. F/u with echo in 6 months.   Truman Hayward 2:43 PM

## 2013-11-19 NOTE — Addendum Note (Signed)
Encounter addended by: Theresia Bough, CMA on: 11/19/2013  2:46 PM<BR>     Documentation filed: Patient Instructions Section, Orders

## 2013-11-28 ENCOUNTER — Ambulatory Visit (INDEPENDENT_AMBULATORY_CARE_PROVIDER_SITE_OTHER): Payer: Medicare HMO | Admitting: *Deleted

## 2013-11-28 DIAGNOSIS — Z952 Presence of prosthetic heart valve: Secondary | ICD-10-CM

## 2013-11-28 DIAGNOSIS — I059 Rheumatic mitral valve disease, unspecified: Secondary | ICD-10-CM

## 2013-11-28 DIAGNOSIS — Z5181 Encounter for therapeutic drug level monitoring: Secondary | ICD-10-CM

## 2013-11-28 DIAGNOSIS — Z7901 Long term (current) use of anticoagulants: Secondary | ICD-10-CM

## 2013-11-28 DIAGNOSIS — Z954 Presence of other heart-valve replacement: Secondary | ICD-10-CM

## 2013-11-28 LAB — POCT INR: INR: 2.7

## 2013-12-10 ENCOUNTER — Encounter: Payer: Medicare HMO | Admitting: *Deleted

## 2013-12-10 ENCOUNTER — Telehealth: Payer: Self-pay | Admitting: Cardiology

## 2013-12-10 NOTE — Telephone Encounter (Signed)
Spoke with pt and reminded pt of remote transmission that is due today. Pt verbalized understanding.   

## 2013-12-11 ENCOUNTER — Encounter: Payer: Self-pay | Admitting: Cardiology

## 2013-12-26 ENCOUNTER — Ambulatory Visit (INDEPENDENT_AMBULATORY_CARE_PROVIDER_SITE_OTHER): Payer: Medicare HMO | Admitting: *Deleted

## 2013-12-26 ENCOUNTER — Emergency Department (HOSPITAL_COMMUNITY)
Admission: EM | Admit: 2013-12-26 | Discharge: 2013-12-26 | Disposition: A | Discharge status: Home / Self Care | Payer: Medicare HMO | Attending: Emergency Medicine | Admitting: Emergency Medicine

## 2013-12-26 ENCOUNTER — Ambulatory Visit (INDEPENDENT_AMBULATORY_CARE_PROVIDER_SITE_OTHER): Payer: Medicare HMO | Admitting: Pharmacist

## 2013-12-26 ENCOUNTER — Encounter (HOSPITAL_COMMUNITY): Payer: Self-pay | Admitting: Emergency Medicine

## 2013-12-26 DIAGNOSIS — I509 Heart failure, unspecified: Secondary | ICD-10-CM | POA: Diagnosis not present

## 2013-12-26 DIAGNOSIS — E119 Type 2 diabetes mellitus without complications: Secondary | ICD-10-CM | POA: Diagnosis not present

## 2013-12-26 DIAGNOSIS — Z7982 Long term (current) use of aspirin: Secondary | ICD-10-CM | POA: Diagnosis not present

## 2013-12-26 DIAGNOSIS — Z8774 Personal history of (corrected) congenital malformations of heart and circulatory system: Secondary | ICD-10-CM | POA: Diagnosis not present

## 2013-12-26 DIAGNOSIS — Z5181 Encounter for therapeutic drug level monitoring: Secondary | ICD-10-CM

## 2013-12-26 DIAGNOSIS — L609 Nail disorder, unspecified: Secondary | ICD-10-CM | POA: Diagnosis present

## 2013-12-26 DIAGNOSIS — E785 Hyperlipidemia, unspecified: Secondary | ICD-10-CM | POA: Insufficient documentation

## 2013-12-26 DIAGNOSIS — Z95810 Presence of automatic (implantable) cardiac defibrillator: Secondary | ICD-10-CM | POA: Diagnosis not present

## 2013-12-26 DIAGNOSIS — Z7901 Long term (current) use of anticoagulants: Secondary | ICD-10-CM

## 2013-12-26 DIAGNOSIS — Z950 Presence of cardiac pacemaker: Secondary | ICD-10-CM | POA: Diagnosis not present

## 2013-12-26 DIAGNOSIS — I10 Essential (primary) hypertension: Secondary | ICD-10-CM | POA: Diagnosis not present

## 2013-12-26 DIAGNOSIS — Z79899 Other long term (current) drug therapy: Secondary | ICD-10-CM | POA: Diagnosis not present

## 2013-12-26 DIAGNOSIS — Z87891 Personal history of nicotine dependence: Secondary | ICD-10-CM | POA: Diagnosis not present

## 2013-12-26 DIAGNOSIS — I428 Other cardiomyopathies: Secondary | ICD-10-CM

## 2013-12-26 DIAGNOSIS — I5022 Chronic systolic (congestive) heart failure: Secondary | ICD-10-CM

## 2013-12-26 DIAGNOSIS — Z954 Presence of other heart-valve replacement: Secondary | ICD-10-CM

## 2013-12-26 DIAGNOSIS — L608 Other nail disorders: Secondary | ICD-10-CM | POA: Insufficient documentation

## 2013-12-26 DIAGNOSIS — Z87442 Personal history of urinary calculi: Secondary | ICD-10-CM | POA: Insufficient documentation

## 2013-12-26 DIAGNOSIS — Z952 Presence of prosthetic heart valve: Secondary | ICD-10-CM

## 2013-12-26 DIAGNOSIS — I059 Rheumatic mitral valve disease, unspecified: Secondary | ICD-10-CM

## 2013-12-26 DIAGNOSIS — L603 Nail dystrophy: Secondary | ICD-10-CM

## 2013-12-26 LAB — POCT INR: INR: 2.6

## 2013-12-26 LAB — CBG MONITORING, ED: Glucose-Capillary: 112 mg/dL — ABNORMAL HIGH (ref 70–99)

## 2013-12-26 NOTE — Discharge Instructions (Signed)
Fingernail or Toenail Loss All or part of your fingernail or toenail has been lost. This may or may not grow back as a normal nail. A special non-stick bandage has been put on your finger or toe tightly to prevent bleeding. HOME CARE INSTRUCTIONS  The tips of fingers and toes are full of nerves and injuries are often very painful. The following will help you decrease the pain and obtain the best outcome.  Keep your hand or foot elevated above your heart to relieve pain and swelling. This will require lying in bed or on a couch with the hand or leg on pillows or sitting in a recliner with the leg up. Letting your hand or leg dangle may increase swelling, slow healing and cause throbbing pain.  Keep your dressing dry and clean.  Change your bandage in 24 hours after going home.  After your bandage is changed, soak your hand or foot in warm soapy water for 10 to 20 minutes. Do this 3 times per day. This helps reduce pain and swelling. After soaking, apply a clean, dry bandage. Change your bandage if it is wet or dirty.  Only take over-the-counter or prescription medicines for pain, discomfort, or fever as directed by your caregiver.  See your caregiver as needed for problems. SEEK IMMEDIATE MEDICAL CARE IF:   You have increased pain, swelling, drainage, or bleeding.  You have a fever. MAKE SURE YOU:   Understand these instructions.  Will watch your condition.  Will get help right away if you are not doing well or get worse. Document Released: 02/17/2006 Document Revised: 06/20/2011 Document Reviewed: 05/09/2006 Exit[employer] Patient Information 2015 [employer]. This information is not intended to replace advice given to you by your health care provider. Make sure you discuss any questions you have with your health care provider.  

## 2013-12-26 NOTE — ED Provider Notes (Signed)
CSN: [id number]     Arrival date & time 12/26/13  1826 History  This chart was scribed for non-physician practitioner, Antony Madura, PA-C,working with Layla Maw Ward, DO, by Karle Plumber, ED Scribe. This patient was seen in room TR07C/TR07C and the patient's care was started at 9:16 PM.  Chief Complaint  Patient presents with  . Nail Problem   The history is provided by the patient. No language interpreter was used.   HPI Comments:  Deborah [name] is a 68 y.o. female with PMH of DM, CHF, HTN and hyperlipidemia who presents to the Emergency Department complaining of mild soreness of her left great toe nail that started last week. Pt states her right great toe nail broke off last week without trauma or injury and is concerned about this secondary to her DM. She denies fever, chills, warmth, drainage or redness of the toe, numbness or tingling of the lower extremities. Pt takes ASA and Coumadin for anticoagulation therapy. She does have a PCP.  Past Medical History  Diagnosis Date  . CHF (congestive heart failure)     --Non-ischemic CM EF 30-35% by echo 9/09 (Previous 25%)  --s/p St. Jude single chamber ICD 04/09 --Minimal  CAD by cath 2004 LAD 20% LCX 20% Ramus 30% RCA nl --echo 05/11 EF 20-25%  . MR (congenital mitral regurgitation)     Severe MR s/p St. Jude mechanical MVR by Gasper Lloyd 2005  . Aortic stenosis, mild     mean gradient echo 09/09  . Diabetes mellitus     type 2  . Kidney stones   . Hyperlipidemia   . Hypertension    Past Surgical History  Procedure Laterality Date  . Appendectomy  1979  . Cardiac defibrillator placement  2009  . Insert / replace / remove pacemaker    . Cardiac valve surgery  2005   Family History  Problem Relation Age of Onset  . Heart disease Mother    History  Substance Use Topics  . Smoking status: Former Smoker    Quit date: 06/16/1984  . Smokeless tobacco: Never Used  . Alcohol Use: No   OB History   Grav Para Term Preterm  Abortions TAB SAB Ect Mult Living                  Review of Systems  Constitutional: Negative for fever and chills.  Skin: Negative for color change.       Toe nail damage.  Neurological: Negative for numbness.  Hematological: Bruises/bleeds easily (secondary to anticoagulant therapy).  All other systems reviewed and are negative.   Allergies  Strawberry  Home Medications   Prior to Admission medications   Medication Sig Start Date End Date Taking? Authorizing Provider  aspirin EC 81 MG tablet Take 1 tablet (81 mg total) by mouth daily. 09/04/13   Duke Salvia, MD  atorvastatin (LIPITOR) 40 MG tablet Take 1 tablet (40 mg total) by mouth daily. 10/24/12   Dolores Patty, MD  carvedilol (COREG) 25 MG tablet Take 1 tablet (25 mg total) by mouth 2 (two) times daily with a meal. 10/24/12   Dolores Patty, MD  Choline Fenofibrate (TRILIPIX) 135 MG capsule Take 1 capsule (135 mg total) by mouth daily. 10/24/12   Dolores Patty, MD  digoxin (LANOXIN) 0.125 MG tablet Take 1 tablet (125 mcg total) by mouth daily. 10/24/12   Dolores Patty, MD  fish oil-omega-3 fatty acids 1000 MG capsule Take by mouth daily.  Historical Provider, MD  HYDROcodone-acetaminophen (VICODIN) 5-500 MG per tablet Take 1 tablet by mouth as needed.      Historical Provider, MD  metFORMIN (GLUCOPHAGE) 1000 MG tablet Take 1,000 mg by mouth 2 (two) times daily with a meal.      Historical Provider, MD  pantoprazole (PROTONIX) 40 MG tablet Take 1 tablet (40 mg total) by mouth daily. 09/20/12   Dolores Patty, MD  quinapril (ACCUPRIL) 40 MG tablet Take 1 tablet (40 mg total) by mouth daily. 10/24/12   Dolores Patty, MD  spironolactone (ALDACTONE) 25 MG tablet Take 1 tablet (25 mg total) by mouth daily. 05/30/13   Dolores Patty, MD  warfarin (COUMADIN) 5 MG tablet Take 5-7.5 mg by mouth daily. Takes 5 mg on Monday, Wednesday and Friday and 7.5 mg all other days    Historical Provider, MD   Triage  Vitals: BP 154/85  Pulse 66  Temp(Src) 98.6 F (37 C) (Oral)  Resp 18  SpO2 98%  Physical Exam  Nursing note and vitals reviewed. Constitutional: She is oriented to person, place, and time. She appears well-developed and well-nourished. No distress.  Nontoxic/nonseptic appearing  HENT:  Head: Normocephalic and atraumatic.  Eyes: Conjunctivae and EOM are normal. No scleral icterus.  Neck: Normal range of motion.  Cardiovascular: Normal rate, regular rhythm and intact distal pulses.   DP and PT pulses 2+ bilaterally  Pulmonary/Chest: Effort normal. No respiratory distress.  Chest expansion symmetric  Musculoskeletal: Normal range of motion.  Neurological: She is alert and oriented to person, place, and time. She exhibits normal muscle tone.  Sensation to light touch intact in bilateral lower extremities.  Skin: Skin is warm and dry. No rash noted. She is not diaphoretic. No erythema. No pallor.  Patient with brittle nails of bilateral great toes. Prior cracking to R great toe appreciated. There is a small crack in the medial aspect of the left great toe, but rest of nail is intact. Nails with worn polish. No surrounding erythema, weeping, drainage, or purulence. No evidence of secondary infection.  Psychiatric: She has a normal mood and affect. Her behavior is normal.    ED Course  Procedures (including critical care time) DIAGNOSTIC STUDIES: Oxygen Saturation is 98% on RA, normal by my interpretation.   COORDINATION OF CARE: 9:24 PM- Will refer to podiatry. Per pt request, will trim left great toe nail. Pt verbalizes understanding and agrees to plan.  Medications - No data to display  Labs Review Labs Reviewed  CBG MONITORING, ED - Abnormal; Notable for the following:    Glucose-Capillary 112 (*)    All other components within normal limits    Imaging Review No results found.   EKG Interpretation None      MDM   Final diagnoses:  Nail brittleness     68 year old female presents to the emergency department for further evaluation of her great toenails. Patient concerned because of a crack to the medial aspect of her left great toenail. Physical exam significant for brittle nature of nails. There is no evidence of secondary infection, cellulitis, or paronychia. No ulcers appreciated. Patient neurovascularly intact. Nails trimmed in the emergency department to prevent further breaking. Will refer patient to podiatry for routine foot care and refer her back to her primary care provider. Return precautions provided and patient agreeable to plan with no unaddressed concerns.  I personally performed the services described in this documentation, which was scribed in my presence. The recorded information has been reviewed  and is accurate.   Filed Vitals:   12/26/13 1844 12/26/13 2144  BP: 154/85 147/79  Pulse: 66 70  Temp: 98.6 F (37 C)   TempSrc: Oral   Resp: 18 17  SpO2: 98% 98%     Antony Madura, PA-C 12/26/13 2209

## 2013-12-26 NOTE — ED Provider Notes (Signed)
Medical screening examination/treatment/procedure(s) were performed by non-physician practitioner and as supervising physician I was immediately available for consultation/collaboration.   EKG Interpretation None        Layla Maw Ward, DO 12/26/13 2308

## 2013-12-26 NOTE — ED Notes (Signed)
Pt reports a toe nail fell off last week and has another toe nail "that I am pretty sure is about to fall off." Denies injury. Pt diabetic, but reports BG is normally between 80-100 and takes insulin as prescribed. No signs of diabetic ulcers. Denies pain.

## 2013-12-26 NOTE — ED Notes (Signed)
Patient states that the toe nail on her right foot broke and the toe nail on her left foot is loose and very sore. Patient is concerned due to the fact that she is a diabetic. No signs of infection noted at this time. A&Ox4. NAD.

## 2013-12-27 LAB — MDC_IDC_ENUM_SESS_TYPE_INCLINIC
Battery Remaining Longevity: 48 mo
Battery Voltage: 2.62 V
Brady Statistic RV Percent Paced: 0 %
Date Time Interrogation Session: 20150917165707
HighPow Impedance: 41.4375
Lead Channel Impedance Value: 425 Ohm
Lead Channel Pacing Threshold Amplitude: 0.75 V
Lead Channel Pacing Threshold Pulse Width: 0.5 ms
Lead Channel Setting Pacing Pulse Width: 0.5 ms
Lead Channel Setting Sensing Sensitivity: 0.3 mV
MDC IDC MSMT LEADCHNL RV PACING THRESHOLD AMPLITUDE: 0.75 V
MDC IDC MSMT LEADCHNL RV PACING THRESHOLD PULSEWIDTH: 0.5 ms
MDC IDC MSMT LEADCHNL RV SENSING INTR AMPL: 11.4 mV
MDC IDC PG SERIAL: 543269
MDC IDC SET LEADCHNL RV PACING AMPLITUDE: 2.5 V
Zone Setting Detection Interval: 250 ms
Zone Setting Detection Interval: 285 ms
Zone Setting Detection Interval: 350 ms

## 2013-12-27 NOTE — Progress Notes (Signed)
ICD check in clinic. Normal device function. Threshold and sensing consistent with previous device measurements. Impedance trends stable over time. No evidence of any ventricular arrhythmias. Histogram distribution appropriate for patient and level of activity. No changes made this session. Device programmed at appropriate safety margins. Device programmed to optimize intrinsic conduction. Estimated longevity 3.6 years. Pt enrolled in remote follow-up. Plan to follow up via [facility] on 12-21 and with SK in 08-2014.

## 2013-12-31 ENCOUNTER — Ambulatory Visit: Payer: Self-pay | Admitting: Podiatry

## 2014-01-03 ENCOUNTER — Ambulatory Visit (INDEPENDENT_AMBULATORY_CARE_PROVIDER_SITE_OTHER): Payer: Medicare HMO | Admitting: Podiatry

## 2014-01-03 ENCOUNTER — Encounter: Payer: Self-pay | Admitting: Podiatry

## 2014-01-03 VITALS — BP 156/86 | HR 81 | Ht 63.0 in | Wt 154.0 lb

## 2014-01-03 DIAGNOSIS — B351 Tinea unguium: Secondary | ICD-10-CM

## 2014-01-03 DIAGNOSIS — M79606 Pain in leg, unspecified: Secondary | ICD-10-CM | POA: Insufficient documentation

## 2014-01-03 DIAGNOSIS — M79609 Pain in unspecified limb: Secondary | ICD-10-CM

## 2014-01-03 NOTE — Progress Notes (Signed)
Subjective: 68 NIDDM presents for foot care. This AM blood sugar was 81. Patient wants to have a pair of diabetic shoes.   Review of Systems - General ROS: negative for - chills, fatigue, fever, hot flashes or sleep disturbance Ophthalmic ROS: negative ENT ROS: Having difficulty hearing. Breast ROS: negative for breast lumps Respiratory ROS: no cough, shortness of breath, or wheezing Cardiovascular ROS: Heart valve replaced with mechanical valve in 2005. Gastrointestinal ROS: no abdominal pain, change in bowel habits, or black or bloody stools Genito-Urinary ROS: no dysuria, trouble voiding, or hematuria Musculoskeletal ROS: Broken right ankle in 2005 and still hurts at times.  Neurological ROS: no TIA or stroke symptoms Dermatological ROS: negative.  Objective: Dermatologic: Thick and dark discolored dystrophic nails with fungal debris x 10. Plantar calluses under 2nd MPJ bilateral. Vascular: All pedal pulses are palpable. No edema or erythema bilateral.  Neurologic: All epicritic and tactile sensations grossly intact. Positive response to Monofilament sensory testing bilateral. Normal DTR response. Orthopedic: Severe contracted lesser toes bilateral.  Assessment: Onychomycosis x 10. Painful feet.  NIDDM.  Plan: Palliation prn. All nails debrided. Patient may benefit from diabetic shoes.

## 2014-01-03 NOTE — Patient Instructions (Signed)
Seen for diabetic foot care. Noted of hypertrophic nails. All nails debrided. Return in 3 months or as needed.

## 2014-01-16 ENCOUNTER — Encounter: Payer: Self-pay | Admitting: Internal Medicine

## 2014-02-06 ENCOUNTER — Ambulatory Visit (INDEPENDENT_AMBULATORY_CARE_PROVIDER_SITE_OTHER): Payer: Medicare HMO | Admitting: *Deleted

## 2014-02-06 DIAGNOSIS — Z952 Presence of prosthetic heart valve: Secondary | ICD-10-CM

## 2014-02-06 DIAGNOSIS — Z7901 Long term (current) use of anticoagulants: Secondary | ICD-10-CM

## 2014-02-06 DIAGNOSIS — I059 Rheumatic mitral valve disease, unspecified: Secondary | ICD-10-CM

## 2014-02-06 DIAGNOSIS — Z5181 Encounter for therapeutic drug level monitoring: Secondary | ICD-10-CM

## 2014-02-06 DIAGNOSIS — Z954 Presence of other heart-valve replacement: Secondary | ICD-10-CM

## 2014-02-06 LAB — POCT INR: INR: 2.2

## 2014-02-20 ENCOUNTER — Ambulatory Visit (INDEPENDENT_AMBULATORY_CARE_PROVIDER_SITE_OTHER): Payer: Medicare HMO | Admitting: *Deleted

## 2014-02-20 DIAGNOSIS — Z5181 Encounter for therapeutic drug level monitoring: Secondary | ICD-10-CM

## 2014-02-20 DIAGNOSIS — Z952 Presence of prosthetic heart valve: Secondary | ICD-10-CM

## 2014-02-20 DIAGNOSIS — I059 Rheumatic mitral valve disease, unspecified: Secondary | ICD-10-CM

## 2014-02-20 DIAGNOSIS — Z954 Presence of other heart-valve replacement: Secondary | ICD-10-CM

## 2014-02-20 DIAGNOSIS — Z7901 Long term (current) use of anticoagulants: Secondary | ICD-10-CM

## 2014-02-20 LAB — POCT INR: INR: 2

## 2014-03-14 ENCOUNTER — Ambulatory Visit (INDEPENDENT_AMBULATORY_CARE_PROVIDER_SITE_OTHER): Payer: Medicare HMO

## 2014-03-14 DIAGNOSIS — Z954 Presence of other heart-valve replacement: Secondary | ICD-10-CM

## 2014-03-14 DIAGNOSIS — Z5181 Encounter for therapeutic drug level monitoring: Secondary | ICD-10-CM

## 2014-03-14 DIAGNOSIS — Z952 Presence of prosthetic heart valve: Secondary | ICD-10-CM

## 2014-03-14 DIAGNOSIS — Z7901 Long term (current) use of anticoagulants: Secondary | ICD-10-CM

## 2014-03-14 DIAGNOSIS — I059 Rheumatic mitral valve disease, unspecified: Secondary | ICD-10-CM

## 2014-03-14 LAB — POCT INR: INR: 4.1

## 2014-03-21 ENCOUNTER — Other Ambulatory Visit (HOSPITAL_COMMUNITY): Payer: Self-pay | Admitting: Cardiology

## 2014-03-21 MED ORDER — SPIRONOLACTONE 25 MG PO TABS: 25.0000 mg | ORAL_TABLET | Freq: Every day | ORAL | Status: DC

## 2014-03-28 ENCOUNTER — Ambulatory Visit (INDEPENDENT_AMBULATORY_CARE_PROVIDER_SITE_OTHER): Payer: Medicare HMO | Admitting: Pharmacist

## 2014-03-28 DIAGNOSIS — Z954 Presence of other heart-valve replacement: Secondary | ICD-10-CM

## 2014-03-28 DIAGNOSIS — I059 Rheumatic mitral valve disease, unspecified: Secondary | ICD-10-CM

## 2014-03-28 DIAGNOSIS — Z7901 Long term (current) use of anticoagulants: Secondary | ICD-10-CM

## 2014-03-28 DIAGNOSIS — Z5181 Encounter for therapeutic drug level monitoring: Secondary | ICD-10-CM

## 2014-03-28 DIAGNOSIS — Z952 Presence of prosthetic heart valve: Secondary | ICD-10-CM

## 2014-03-28 LAB — POCT INR: INR: 3

## 2014-03-31 ENCOUNTER — Telehealth: Payer: Self-pay | Admitting: Cardiology

## 2014-03-31 ENCOUNTER — Encounter: Payer: Medicare HMO | Admitting: *Deleted

## 2014-03-31 NOTE — Telephone Encounter (Signed)
Spoke with pt and reminded pt of remote transmission that is due today. Pt verbalized understanding.   

## 2014-04-01 ENCOUNTER — Encounter: Payer: Self-pay | Admitting: Cardiology

## 2014-04-08 ENCOUNTER — Ambulatory Visit: Payer: Medicare HMO | Admitting: Podiatry

## 2014-04-09 ENCOUNTER — Encounter: Payer: Self-pay | Admitting: Cardiology

## 2014-04-15 ENCOUNTER — Ambulatory Visit (INDEPENDENT_AMBULATORY_CARE_PROVIDER_SITE_OTHER): Payer: Medicare HMO | Admitting: Pharmacist

## 2014-04-15 DIAGNOSIS — Z952 Presence of prosthetic heart valve: Secondary | ICD-10-CM

## 2014-04-15 DIAGNOSIS — Z7901 Long term (current) use of anticoagulants: Secondary | ICD-10-CM

## 2014-04-15 DIAGNOSIS — Z954 Presence of other heart-valve replacement: Secondary | ICD-10-CM

## 2014-04-15 DIAGNOSIS — I059 Rheumatic mitral valve disease, unspecified: Secondary | ICD-10-CM

## 2014-04-15 DIAGNOSIS — Z5181 Encounter for therapeutic drug level monitoring: Secondary | ICD-10-CM

## 2014-04-15 LAB — POCT INR: INR: 2.2

## 2014-04-17 ENCOUNTER — Telehealth: Payer: Self-pay | Admitting: Internal Medicine

## 2014-04-17 NOTE — Telephone Encounter (Signed)
LMOVM for pt to return call 

## 2014-04-17 NOTE — Telephone Encounter (Signed)
New Msg          Please contact pt in regards to [facility] transmission that wasn't received.

## 2014-04-18 ENCOUNTER — Ambulatory Visit (INDEPENDENT_AMBULATORY_CARE_PROVIDER_SITE_OTHER): Payer: Medicare HMO | Admitting: *Deleted

## 2014-04-18 DIAGNOSIS — I428 Other cardiomyopathies: Secondary | ICD-10-CM

## 2014-04-18 LAB — MDC_IDC_ENUM_SESS_TYPE_REMOTE
Battery Remaining Longevity: 52 mo
Battery Voltage: 2.59 V
Date Time Interrogation Session: 20160108162707
HighPow Impedance: 40 Ohm
Implantable Pulse Generator Serial Number: 543269
Lead Channel Impedance Value: 440 Ohm
Lead Channel Pacing Threshold Pulse Width: 0.5 ms
Lead Channel Sensing Intrinsic Amplitude: 10.2 mV
Lead Channel Setting Pacing Amplitude: 2.5 V
Lead Channel Setting Sensing Sensitivity: 0.3 mV
MDC IDC MSMT LEADCHNL RV PACING THRESHOLD AMPLITUDE: 0.75 V
MDC IDC SET LEADCHNL RV PACING PULSEWIDTH: 0.5 ms
MDC IDC SET ZONE DETECTION INTERVAL: 250 ms
MDC IDC STAT BRADY RV PERCENT PACED: 1 %
Zone Setting Detection Interval: 285 ms
Zone Setting Detection Interval: 350 ms

## 2014-04-18 NOTE — Progress Notes (Signed)
Remote ICD transmission.   

## 2014-04-18 NOTE — Telephone Encounter (Signed)
Spoke w/ pt and informed her that the transmission was not received. Attempted to help pt trouble shoot monitor. The monitor would not reset. I instructed pt to call tech services. Pt verbalized understanding.

## 2014-05-07 ENCOUNTER — Ambulatory Visit (INDEPENDENT_AMBULATORY_CARE_PROVIDER_SITE_OTHER): Payer: Medicare HMO | Admitting: *Deleted

## 2014-05-07 DIAGNOSIS — Z7901 Long term (current) use of anticoagulants: Secondary | ICD-10-CM

## 2014-05-07 DIAGNOSIS — Z952 Presence of prosthetic heart valve: Secondary | ICD-10-CM

## 2014-05-07 DIAGNOSIS — I059 Rheumatic mitral valve disease, unspecified: Secondary | ICD-10-CM

## 2014-05-07 DIAGNOSIS — Z5181 Encounter for therapeutic drug level monitoring: Secondary | ICD-10-CM

## 2014-05-07 DIAGNOSIS — Z954 Presence of other heart-valve replacement: Secondary | ICD-10-CM

## 2014-05-07 LAB — POCT INR: INR: 3.1

## 2014-05-28 ENCOUNTER — Ambulatory Visit (INDEPENDENT_AMBULATORY_CARE_PROVIDER_SITE_OTHER): Payer: Medicare HMO | Admitting: Pharmacist

## 2014-05-28 DIAGNOSIS — Z954 Presence of other heart-valve replacement: Secondary | ICD-10-CM

## 2014-05-28 DIAGNOSIS — Z5181 Encounter for therapeutic drug level monitoring: Secondary | ICD-10-CM

## 2014-05-28 DIAGNOSIS — Z952 Presence of prosthetic heart valve: Secondary | ICD-10-CM

## 2014-05-28 DIAGNOSIS — Z7901 Long term (current) use of anticoagulants: Secondary | ICD-10-CM

## 2014-05-28 DIAGNOSIS — I059 Rheumatic mitral valve disease, unspecified: Secondary | ICD-10-CM

## 2014-05-28 LAB — POCT INR: INR: 2.5

## 2014-06-19 ENCOUNTER — Encounter: Payer: Self-pay | Admitting: *Deleted

## 2014-06-25 ENCOUNTER — Ambulatory Visit (INDEPENDENT_AMBULATORY_CARE_PROVIDER_SITE_OTHER): Payer: Medicare HMO | Admitting: *Deleted

## 2014-06-25 ENCOUNTER — Encounter: Payer: Self-pay | Admitting: Internal Medicine

## 2014-06-25 DIAGNOSIS — Z954 Presence of other heart-valve replacement: Secondary | ICD-10-CM

## 2014-06-25 DIAGNOSIS — Z952 Presence of prosthetic heart valve: Secondary | ICD-10-CM

## 2014-06-25 DIAGNOSIS — I059 Rheumatic mitral valve disease, unspecified: Secondary | ICD-10-CM

## 2014-06-25 DIAGNOSIS — Z5181 Encounter for therapeutic drug level monitoring: Secondary | ICD-10-CM

## 2014-06-25 DIAGNOSIS — Z7901 Long term (current) use of anticoagulants: Secondary | ICD-10-CM

## 2014-06-25 LAB — POCT INR: INR: 3.1

## 2014-08-06 ENCOUNTER — Ambulatory Visit (INDEPENDENT_AMBULATORY_CARE_PROVIDER_SITE_OTHER): Payer: Medicare HMO | Admitting: *Deleted

## 2014-08-06 DIAGNOSIS — Z7901 Long term (current) use of anticoagulants: Secondary | ICD-10-CM | POA: Diagnosis not present

## 2014-08-06 DIAGNOSIS — Z5181 Encounter for therapeutic drug level monitoring: Secondary | ICD-10-CM | POA: Diagnosis not present

## 2014-08-06 DIAGNOSIS — I059 Rheumatic mitral valve disease, unspecified: Secondary | ICD-10-CM

## 2014-08-06 DIAGNOSIS — Z954 Presence of other heart-valve replacement: Secondary | ICD-10-CM | POA: Diagnosis not present

## 2014-08-06 DIAGNOSIS — Z952 Presence of prosthetic heart valve: Secondary | ICD-10-CM

## 2014-08-06 LAB — POCT INR: INR: 2

## 2014-08-20 ENCOUNTER — Ambulatory Visit (INDEPENDENT_AMBULATORY_CARE_PROVIDER_SITE_OTHER): Payer: Medicare HMO | Admitting: *Deleted

## 2014-08-20 DIAGNOSIS — Z5181 Encounter for therapeutic drug level monitoring: Secondary | ICD-10-CM

## 2014-08-20 DIAGNOSIS — Z7901 Long term (current) use of anticoagulants: Secondary | ICD-10-CM | POA: Diagnosis not present

## 2014-08-20 DIAGNOSIS — Z954 Presence of other heart-valve replacement: Secondary | ICD-10-CM

## 2014-08-20 DIAGNOSIS — Z952 Presence of prosthetic heart valve: Secondary | ICD-10-CM

## 2014-08-20 DIAGNOSIS — I059 Rheumatic mitral valve disease, unspecified: Secondary | ICD-10-CM | POA: Diagnosis not present

## 2014-08-20 LAB — POCT INR: INR: 2.8

## 2014-08-26 ENCOUNTER — Other Ambulatory Visit (HOSPITAL_COMMUNITY): Payer: Self-pay | Admitting: Internal Medicine

## 2014-09-11 ENCOUNTER — Ambulatory Visit (INDEPENDENT_AMBULATORY_CARE_PROVIDER_SITE_OTHER): Payer: Commercial Managed Care - HMO

## 2014-09-11 DIAGNOSIS — I059 Rheumatic mitral valve disease, unspecified: Secondary | ICD-10-CM | POA: Diagnosis not present

## 2014-09-11 DIAGNOSIS — Z954 Presence of other heart-valve replacement: Secondary | ICD-10-CM | POA: Diagnosis not present

## 2014-09-11 DIAGNOSIS — Z5181 Encounter for therapeutic drug level monitoring: Secondary | ICD-10-CM

## 2014-09-11 DIAGNOSIS — Z7901 Long term (current) use of anticoagulants: Secondary | ICD-10-CM

## 2014-09-11 DIAGNOSIS — Z952 Presence of prosthetic heart valve: Secondary | ICD-10-CM

## 2014-09-11 LAB — POCT INR: INR: 3.5

## 2014-09-17 DIAGNOSIS — I10 Essential (primary) hypertension: Secondary | ICD-10-CM | POA: Diagnosis not present

## 2014-09-17 DIAGNOSIS — I2510 Atherosclerotic heart disease of native coronary artery without angina pectoris: Secondary | ICD-10-CM | POA: Diagnosis not present

## 2014-09-17 DIAGNOSIS — E1165 Type 2 diabetes mellitus with hyperglycemia: Secondary | ICD-10-CM | POA: Diagnosis not present

## 2014-09-17 DIAGNOSIS — E782 Mixed hyperlipidemia: Secondary | ICD-10-CM | POA: Diagnosis not present

## 2014-10-01 ENCOUNTER — Telehealth: Payer: Self-pay | Admitting: *Deleted

## 2014-10-01 NOTE — Telephone Encounter (Signed)
called for fm hx/status, no answer 

## 2014-10-03 ENCOUNTER — Ambulatory Visit (INDEPENDENT_AMBULATORY_CARE_PROVIDER_SITE_OTHER): Payer: Commercial Managed Care - HMO | Admitting: Internal Medicine

## 2014-10-03 ENCOUNTER — Encounter: Payer: Self-pay | Admitting: Internal Medicine

## 2014-10-03 ENCOUNTER — Ambulatory Visit (INDEPENDENT_AMBULATORY_CARE_PROVIDER_SITE_OTHER): Payer: Commercial Managed Care - HMO | Admitting: *Deleted

## 2014-10-03 VITALS — BP 132/64 | HR 62 | Ht 63.0 in | Wt 160.0 lb

## 2014-10-03 DIAGNOSIS — Z4502 Encounter for adjustment and management of automatic implantable cardiac defibrillator: Secondary | ICD-10-CM

## 2014-10-03 DIAGNOSIS — Z79899 Other long term (current) drug therapy: Secondary | ICD-10-CM

## 2014-10-03 DIAGNOSIS — I428 Other cardiomyopathies: Secondary | ICD-10-CM | POA: Diagnosis not present

## 2014-10-03 DIAGNOSIS — Z954 Presence of other heart-valve replacement: Secondary | ICD-10-CM

## 2014-10-03 DIAGNOSIS — Z7901 Long term (current) use of anticoagulants: Secondary | ICD-10-CM | POA: Diagnosis not present

## 2014-10-03 DIAGNOSIS — I5022 Chronic systolic (congestive) heart failure: Secondary | ICD-10-CM

## 2014-10-03 DIAGNOSIS — Z952 Presence of prosthetic heart valve: Secondary | ICD-10-CM

## 2014-10-03 DIAGNOSIS — I059 Rheumatic mitral valve disease, unspecified: Secondary | ICD-10-CM | POA: Diagnosis not present

## 2014-10-03 DIAGNOSIS — Z5181 Encounter for therapeutic drug level monitoring: Secondary | ICD-10-CM

## 2014-10-03 LAB — BASIC METABOLIC PANEL
BUN: 20 mg/dL (ref 6–23)
CHLORIDE: 106 meq/L (ref 96–112)
CO2: 29 meq/L (ref 19–32)
CREATININE: 1.03 mg/dL (ref 0.40–1.20)
Calcium: 9.4 mg/dL (ref 8.4–10.5)
GFR: 67.93 mL/min (ref >60.00)
Glucose, Bld: 86 mg/dL (ref 70–99)
Potassium: 4.6 mEq/L (ref 3.5–5.1)
SODIUM: 140 meq/L (ref 135–145)

## 2014-10-03 LAB — POCT INR: INR: 2.5

## 2014-10-03 NOTE — Progress Notes (Signed)
Patient Care Team: Georgianne Fick, MD as PCP - General (Internal Medicine) Corine Shelter, MD (Pulmonary Disease) Dolores Patty, MD (Cardiology) Duke Salvia, MD (Cardiology)   HPI  Deborah [name] is a 69 y.o. female Seen in followup for ICD implantation for primary prevention with a history of congestive heart failure, secondary to nonischemic cardiomyopathy.  Echo 12/14 ejection fraction around 25%  She also has a history of mitral valve disease and status post mitral valve replacement with a St. Jude valve in 2005    Denies chest pain shortness of breath orthopnea or peripheral edema.  Past Medical History  Diagnosis Date  . CHF (congestive heart failure)     --Non-ischemic CM EF 30-35% by echo 9/09 (Previous 25%)  --s/p St. Jude single chamber ICD 04/09 --Minimal  CAD by cath 2004 LAD 20% LCX 20% Ramus 30% RCA nl --echo 05/11 EF 20-25%  . MR (congenital mitral regurgitation)     Severe MR s/p St. Jude mechanical MVR by Gasper Lloyd 2005  . Aortic stenosis, mild     mean gradient echo 09/09  . Diabetes mellitus     type 2  . Kidney stones   . Hyperlipidemia   . Hypertension     Past Surgical History  Procedure Laterality Date  . Appendectomy  1979  . Cardiac defibrillator placement  2009  . Insert / replace / remove pacemaker    . Cardiac valve surgery  2005    Current Outpatient Prescriptions  Medication Sig Dispense Refill  . aspirin EC 81 MG tablet Take 1 tablet (81 mg total) by mouth daily. 30 tablet 11  . atorvastatin (LIPITOR) 40 MG tablet Take 1 tablet (40 mg total) by mouth daily. 90 tablet 1  . carvedilol (COREG) 25 MG tablet Take 1 tablet (25 mg total) by mouth 2 (two) times daily with a meal. 180 tablet 1  . Choline Fenofibrate (TRILIPIX) 135 MG capsule Take 1 capsule (135 mg total) by mouth daily. 90 capsule 1  . digoxin (LANOXIN) 0.125 MG tablet Take 1 tablet (125 mcg total) by mouth daily. 90 tablet 1  . fish oil-omega-3  fatty acids 1000 MG capsule Take by mouth daily.     Marland Kitchen HYDROcodone-acetaminophen (VICODIN) 5-500 MG per tablet Take 1 tablet by mouth as needed.      . metFORMIN (GLUCOPHAGE) 1000 MG tablet Take 1,000 mg by mouth 2 (two) times daily with a meal.      . pantoprazole (PROTONIX) 40 MG tablet Take 1 tablet (40 mg total) by mouth daily. 30 tablet 11  . quinapril (ACCUPRIL) 40 MG tablet Take 1 tablet (40 mg total) by mouth daily. 90 tablet 1  . spironolactone (ALDACTONE) 25 MG tablet TAKE 1 TABLET EVERY DAY 90 tablet 1  . warfarin (COUMADIN) 5 MG tablet Take 5-7.5 mg by mouth daily. Takes 5 mg on Monday, Wednesday and Friday and 7.5 mg all other days     No current facility-administered medications for this visit.    Allergies  Allergen Reactions  . Strawberry     Rash & itching.    Review of Systems negative except from HPI and PMH  Physical Exam There were no vitals taken for this visit. Well developed and well nourished in no acute distress HENT normal E scleral and icterus clear Neck Supple JVP flat; carotids brisk and full Clear to ausculation  *Regular rate and rhythm, mechanical S1 and 2/6 murmur  Soft with active bowel  sounds No clubbing cyanosis  Edema Alert and oriented, grossly normal motor and sensory function Skin Warm and Dry ECG demonstrates NSR    Assessment and  Plan  Valve replacement-mitral-mechanical  Nonischemic cardiomyopathy  Hypertension  Implantable defibrillator-St. Jude   Defibrillator is working appropriately.  Previously she needs surveillance laboratories were her amiodarone and her digoxin   Blood pressure well controlled  Euvolemic continue current meds   she would like to have some teeth extracted. I suggested that she discuss this with the Coumadin clinic but I suspect that she would be most safely served by heparin overlap.

## 2014-10-03 NOTE — Patient Instructions (Addendum)
Medication Instructions:  Your physician recommends that you continue on your current medications as directed. Please refer to the Current Medication list given to you today.  Labwork: Medications surveillance labs today: Digoxin level, BMET  Testing/Procedures: None ordered  Follow-Up: Remote monitoring is used to monitor your ICD from home. This monitoring reduces the number of office visits required to check your device to one time per year. It allows [place] to keep an eye on the functioning of your device to ensure it is working properly. You are scheduled for a device check from home on 01/05/2015. You may send your transmission at any time that day. If you have a wireless device, the transmission will be sent automatically. After your physician reviews your transmission, you will receive a postcard with your next transmission date.  Your physician recommends that you schedule a follow-up appointment in: 12 months with Dr.Klein.  Thank you for choosing [facility]!!

## 2014-10-04 LAB — DIGOXIN LEVEL: Digoxin Level: 1.1 ug/L (ref 0.8–2.0)

## 2014-10-07 LAB — CUP PACEART INCLINIC DEVICE CHECK
Battery Remaining Longevity: 42 mo
Battery Voltage: 2.6 V
Date Time Interrogation Session: 20160624183250
HighPow Impedance: 39.6225
Lead Channel Impedance Value: 412.5 Ohm
Lead Channel Pacing Threshold Amplitude: 0.75 V
Lead Channel Pacing Threshold Pulse Width: 0.5 ms
Lead Channel Pacing Threshold Pulse Width: 0.5 ms
Lead Channel Sensing Intrinsic Amplitude: 11.4 mV
Lead Channel Setting Pacing Amplitude: 2.5 V
MDC IDC MSMT LEADCHNL RV PACING THRESHOLD AMPLITUDE: 0.75 V
MDC IDC PG SERIAL: 543269
MDC IDC SET LEADCHNL RV PACING PULSEWIDTH: 0.5 ms
MDC IDC SET LEADCHNL RV SENSING SENSITIVITY: 0.3 mV
MDC IDC SET ZONE DETECTION INTERVAL: 350 ms
MDC IDC STAT BRADY RV PERCENT PACED: 0.14 %
Zone Setting Detection Interval: 250 ms
Zone Setting Detection Interval: 285 ms

## 2014-10-17 DIAGNOSIS — F1190 Opioid use, unspecified, uncomplicated: Secondary | ICD-10-CM | POA: Diagnosis not present

## 2014-10-17 DIAGNOSIS — M150 Primary generalized (osteo)arthritis: Secondary | ICD-10-CM | POA: Diagnosis not present

## 2014-10-24 ENCOUNTER — Other Ambulatory Visit: Payer: Self-pay | Admitting: *Deleted

## 2014-10-24 ENCOUNTER — Telehealth: Payer: Self-pay | Admitting: Internal Medicine

## 2014-10-24 MED ORDER — DIGOXIN 62.5 MCG PO TABS: 0.0625 mg | ORAL_TABLET | Freq: Every day | ORAL | Status: DC

## 2014-10-24 NOTE — Telephone Encounter (Signed)
Patient just wanted me to know that she was able to cut Digoxin pill in half, herself. She will utilize the 0.125 pills that she has paid for by cutting them in half.

## 2014-10-24 NOTE — Telephone Encounter (Signed)
New message      Pt has another question to ask Sherri.  Please call at your convenience

## 2014-10-28 ENCOUNTER — Ambulatory Visit (INDEPENDENT_AMBULATORY_CARE_PROVIDER_SITE_OTHER): Payer: Commercial Managed Care - HMO | Admitting: *Deleted

## 2014-10-28 DIAGNOSIS — Z5181 Encounter for therapeutic drug level monitoring: Secondary | ICD-10-CM | POA: Diagnosis not present

## 2014-10-28 DIAGNOSIS — I059 Rheumatic mitral valve disease, unspecified: Secondary | ICD-10-CM | POA: Diagnosis not present

## 2014-10-28 DIAGNOSIS — Z954 Presence of other heart-valve replacement: Secondary | ICD-10-CM | POA: Diagnosis not present

## 2014-10-28 DIAGNOSIS — Z952 Presence of prosthetic heart valve: Secondary | ICD-10-CM

## 2014-10-28 DIAGNOSIS — Z7901 Long term (current) use of anticoagulants: Secondary | ICD-10-CM | POA: Diagnosis not present

## 2014-10-28 LAB — POCT INR: INR: 2.4

## 2014-11-04 ENCOUNTER — Telehealth: Payer: Self-pay | Admitting: Internal Medicine

## 2014-11-04 NOTE — Telephone Encounter (Signed)
New message       Pt is calling to see if Dr Graciela Husbands completed the papers to clear her so that she can have her teeth pulled.  Please call

## 2014-11-05 ENCOUNTER — Telehealth: Payer: Self-pay | Admitting: Internal Medicine

## 2014-11-05 NOTE — Telephone Encounter (Signed)
New message     Pt spoke with you this morning and she has a couple questions regarding your earlier conversation Please call to discuss

## 2014-11-05 NOTE — Telephone Encounter (Signed)
Patient was asking about premedication for dental procedure.  Informed her that we can address that when she comes in for coumadin visit 8/9.  I will place a note in appt comment for Coumadin clinic.

## 2014-11-05 NOTE — Telephone Encounter (Signed)
Informed patient papers were completed by Dr. Graciela Husbands and Coumadin clinic and faxed to dental office. Advised she has an appt 8/9 for Lovenox bridging with coumadin clinic for this procedure.  She is aware to call office to arrange sooner appt if needed.  Pt states she will wait until 8/9.

## 2014-11-12 DIAGNOSIS — E1165 Type 2 diabetes mellitus with hyperglycemia: Secondary | ICD-10-CM | POA: Diagnosis not present

## 2014-11-12 DIAGNOSIS — I2510 Atherosclerotic heart disease of native coronary artery without angina pectoris: Secondary | ICD-10-CM | POA: Diagnosis not present

## 2014-11-12 DIAGNOSIS — E782 Mixed hyperlipidemia: Secondary | ICD-10-CM | POA: Diagnosis not present

## 2014-11-12 DIAGNOSIS — I10 Essential (primary) hypertension: Secondary | ICD-10-CM | POA: Diagnosis not present

## 2014-11-13 DIAGNOSIS — E875 Hyperkalemia: Secondary | ICD-10-CM | POA: Diagnosis not present

## 2014-11-18 ENCOUNTER — Ambulatory Visit (INDEPENDENT_AMBULATORY_CARE_PROVIDER_SITE_OTHER): Payer: Commercial Managed Care - HMO | Admitting: *Deleted

## 2014-11-18 DIAGNOSIS — I059 Rheumatic mitral valve disease, unspecified: Secondary | ICD-10-CM | POA: Diagnosis not present

## 2014-11-18 DIAGNOSIS — Z7901 Long term (current) use of anticoagulants: Secondary | ICD-10-CM

## 2014-11-18 DIAGNOSIS — Z5181 Encounter for therapeutic drug level monitoring: Secondary | ICD-10-CM

## 2014-11-18 DIAGNOSIS — Z952 Presence of prosthetic heart valve: Secondary | ICD-10-CM

## 2014-11-18 DIAGNOSIS — Z954 Presence of other heart-valve replacement: Secondary | ICD-10-CM | POA: Diagnosis not present

## 2014-11-18 LAB — POCT INR: INR: 2.9

## 2014-11-18 MED ORDER — ENOXAPARIN SODIUM 80 MG/0.8ML ~~LOC~~ SOLN: 80.0000 mg | Freq: Two times a day (BID) | SUBCUTANEOUS | Status: DC

## 2014-11-18 NOTE — Patient Instructions (Addendum)
11/18/14- Today take 1.5 pills of Coumadin  11/19/14- Take 1.5 pills of Coumadin   11/20/14- Take LAST  dose of Coumadin  11/21/14- Do not take any thing  11/22/14- Start Lovenox ( Enoxaparin) 80mg s injection into fatty tissue of  stomach at 8 am and 8 pm, rotate sites, make sure you are at least 2  inches away from belly button.   11/23/14-  Continue Lovenox ( Enoxaparin) 80mg s injection into fatty tissue of  stomach at 8 am and 8 pm, rotate sites, make sure you are at least 2 inches away from belly button.   11/24/14- Continue Lovenox ( Enoxaparin) 80mg s injection into fatty tissue of  stomach at 8 am and 8 pm, rotate sites, make sure you are at least 2 inches away from belly button.   11/25/14- Take 1  Lovenox ( Enoxaparin) 80mg s injection into fatty tissue of  stomach at 8 am, This is your last injection before surgery.    11/26/14- Do not taking any Lovenox injections. Day to get teeth pulled. Restart Coumadin that evening, take 2 pills of Coumadin   11/27/14- Restart Lovenox injection 80mg s at 8 am and 8 pm. Also take 2 pills of Coumadin   11/28/14- Take Coumadin 1.5 pills, and take Lovenox injection 80mg s at 8 am and 8 pm.  11/29/14-  Take Coumadin 1.5 pills, and take Lovenox injection 80mg s at 8 am and 8 pm.  11/30/14-  Take Coumadin 1.5 pills, and take Lovenox injection 80mg s at 8 am and 8 pm.  12/01/14-  Take Coumadin 1.5 pills, and take Lovenox injection 80mg s at 8 am, Come in for appointment.

## 2014-11-20 ENCOUNTER — Telehealth: Payer: Self-pay | Admitting: Internal Medicine

## 2014-11-20 MED ORDER — AMOXICILLIN 500 MG PO TABS: 2000.0000 mg | ORAL_TABLET | Freq: Once | ORAL | Status: DC

## 2014-11-20 NOTE — Telephone Encounter (Signed)
New message      1. What dental office are you calling from? [facility]  2. What is your office phone and fax number?  Fax [phone]  3. What type of procedure is the patient having performed? 4 extractions  4. What date is procedure scheduled? 11-26-14  5. What is your question (ex. Antibiotics prior to procedure, holding medication-we need to know how long dentist wants pt to hold med)? Need to be premedicated 1 hr prior to procedure.  Please call in amoxicillian to pharmacy.  She did not know the name of the pharmacy pt uses

## 2014-11-20 NOTE — Telephone Encounter (Signed)
Informed patient that I would send prescription today.  Sent to [address] per pt request. Advised her to take prophylactic med 1 hour prior to procedure. Patient verbalized understanding and agreeable to plan.

## 2014-11-20 NOTE — Telephone Encounter (Signed)
New message     Pt wanting to know if antibiotic has been called in  Please call to discuss

## 2014-11-20 NOTE — Telephone Encounter (Signed)
Informed that this was already handled and patient is aware of taking med 1 hour prior to procedure.

## 2014-11-25 DIAGNOSIS — E1165 Type 2 diabetes mellitus with hyperglycemia: Secondary | ICD-10-CM | POA: Diagnosis not present

## 2014-11-25 DIAGNOSIS — E782 Mixed hyperlipidemia: Secondary | ICD-10-CM | POA: Diagnosis not present

## 2014-11-25 DIAGNOSIS — Z0000 Encounter for general adult medical examination without abnormal findings: Secondary | ICD-10-CM | POA: Diagnosis not present

## 2014-11-25 DIAGNOSIS — I10 Essential (primary) hypertension: Secondary | ICD-10-CM | POA: Diagnosis not present

## 2014-11-25 DIAGNOSIS — I2510 Atherosclerotic heart disease of native coronary artery without angina pectoris: Secondary | ICD-10-CM | POA: Diagnosis not present

## 2014-11-25 DIAGNOSIS — F1190 Opioid use, unspecified, uncomplicated: Secondary | ICD-10-CM | POA: Diagnosis not present

## 2014-11-29 ENCOUNTER — Encounter (HOSPITAL_COMMUNITY): Payer: Self-pay

## 2014-11-29 ENCOUNTER — Emergency Department (HOSPITAL_COMMUNITY)
Admission: EM | Admit: 2014-11-29 | Discharge: 2014-11-29 | Disposition: A | Discharge status: Home / Self Care | Payer: Commercial Managed Care - HMO | Attending: Emergency Medicine | Admitting: Emergency Medicine

## 2014-11-29 DIAGNOSIS — E119 Type 2 diabetes mellitus without complications: Secondary | ICD-10-CM | POA: Insufficient documentation

## 2014-11-29 DIAGNOSIS — Z87891 Personal history of nicotine dependence: Secondary | ICD-10-CM | POA: Insufficient documentation

## 2014-11-29 DIAGNOSIS — Z792 Long term (current) use of antibiotics: Secondary | ICD-10-CM | POA: Insufficient documentation

## 2014-11-29 DIAGNOSIS — I10 Essential (primary) hypertension: Secondary | ICD-10-CM | POA: Insufficient documentation

## 2014-11-29 DIAGNOSIS — I509 Heart failure, unspecified: Secondary | ICD-10-CM | POA: Diagnosis not present

## 2014-11-29 DIAGNOSIS — K1379 Other lesions of oral mucosa: Secondary | ICD-10-CM | POA: Diagnosis not present

## 2014-11-29 DIAGNOSIS — Z79899 Other long term (current) drug therapy: Secondary | ICD-10-CM | POA: Diagnosis not present

## 2014-11-29 DIAGNOSIS — K088 Other specified disorders of teeth and supporting structures: Secondary | ICD-10-CM | POA: Diagnosis present

## 2014-11-29 DIAGNOSIS — E785 Hyperlipidemia, unspecified: Secondary | ICD-10-CM | POA: Insufficient documentation

## 2014-11-29 DIAGNOSIS — Z87442 Personal history of urinary calculi: Secondary | ICD-10-CM | POA: Diagnosis not present

## 2014-11-29 DIAGNOSIS — K08409 Partial loss of teeth, unspecified cause, unspecified class: Secondary | ICD-10-CM | POA: Insufficient documentation

## 2014-11-29 DIAGNOSIS — K91840 Postprocedural hemorrhage and hematoma of a digestive system organ or structure following a digestive system procedure: Secondary | ICD-10-CM | POA: Insufficient documentation

## 2014-11-29 DIAGNOSIS — Z7982 Long term (current) use of aspirin: Secondary | ICD-10-CM | POA: Insufficient documentation

## 2014-11-29 LAB — CBC
HEMATOCRIT: 31.8 % — AB (ref 36.0–46.0)
Hemoglobin: 10.6 g/dL — ABNORMAL LOW (ref 12.0–15.0)
MCH: 30.6 pg (ref 26.0–34.0)
MCHC: 33.3 g/dL (ref 30.0–36.0)
MCV: 91.9 fL (ref 78.0–100.0)
Platelets: 213 10*3/uL (ref 150–400)
RBC: 3.46 MIL/uL — ABNORMAL LOW (ref 3.87–5.11)
RDW: 12.5 % (ref 11.5–15.5)
WBC: 8.6 10*3/uL (ref 4.0–10.5)

## 2014-11-29 LAB — PROTIME-INR
INR: 1.99 — AB (ref 0.00–1.49)
Prothrombin Time: 22.5 seconds — ABNORMAL HIGH (ref 11.6–15.2)

## 2014-11-29 MED ORDER — OXYMETAZOLINE HCL 0.05 % NA SOLN
1.0000 | Freq: Once | NASAL | Status: AC
  Administered 2014-11-29: 1 via NASAL
  Filled 2014-11-29: qty 15

## 2014-11-29 MED ORDER — LIDOCAINE-EPINEPHRINE (PF) 2 %-1:200000 IJ SOLN
10.0000 mL | Freq: Once | INTRAMUSCULAR | Status: AC
  Administered 2014-11-29: 10 mL
  Filled 2014-11-29: qty 20

## 2014-11-29 MED ORDER — LIDOCAINE-EPINEPHRINE-TETRACAINE (LET) SOLUTION: 3.0000 mL | Freq: Once | NASAL | Status: DC

## 2014-11-29 MED ORDER — TRANEXAMIC ACID 1000 MG/10ML IV SOLN
500.0000 mg | Freq: Once | INTRAVENOUS | Status: AC
  Administered 2014-11-29: 500 mg via TOPICAL
  Filled 2014-11-29: qty 10

## 2014-11-29 NOTE — ED Provider Notes (Signed)
CSN: [id number]     Arrival date & time 11/29/14  0308 History   First MD Initiated Contact with Patient 11/29/14 [phone]     Chief Complaint  Patient presents with  . Dental Problem    (Consider location/radiation/quality/duration/timing/severity/associated sxs/prior Treatment) HPI Comments: Patient is a 69 year old female with a history of CHF, MR, s/p MVR on chronic coumadin, DM, HTN, and HLD. She presents to the ED for evaluation of dental bleeding. The patient states that she had 4 teeth extracted on Wednesday morning. She states that she held her coumadin prior to this procedure, but restarted it on Wednesday evening. Patient states that she began to experience bleeding at 1300 yesterday. Bleeding has been constant and not controlled with pressure. She states that she has been mildly lightheaded. She denies syncope or fever.  DDS - Alberteen Spindle  The history is provided by the patient. No language interpreter was used.    Past Medical History  Diagnosis Date  . CHF (congestive heart failure)     --Non-ischemic CM EF 30-35% by echo 9/09 (Previous 25%)  --s/p St. Jude single chamber ICD 04/09 --Minimal  CAD by cath 2004 LAD 20% LCX 20% Ramus 30% RCA nl --echo 05/11 EF 20-25%  . MR (congenital mitral regurgitation)     Severe MR s/p St. Jude mechanical MVR by Gasper Lloyd 2005  . Aortic stenosis, mild     mean gradient echo 09/09  . Diabetes mellitus     type 2  . Kidney stones   . Hyperlipidemia   . Hypertension    Past Surgical History  Procedure Laterality Date  . Appendectomy  1979  . Cardiac defibrillator placement  2009  . Insert / replace / remove pacemaker    . Cardiac valve surgery  2005   Family History  Problem Relation Age of Onset  . Heart disease Mother    Social History  Substance Use Topics  . Smoking status: Former Smoker    Quit date: 06/16/1984  . Smokeless tobacco: Never Used  . Alcohol Use: No   OB History    No data available       Review of Systems  Constitutional: Positive for fatigue. Negative for fever.  HENT: Positive for dental problem.   Neurological: Positive for light-headedness. Negative for syncope.  All other systems reviewed and are negative.   Allergies  Strawberry  Home Medications   Prior to Admission medications   Medication Sig Start Date End Date Taking? Authorizing Provider  amoxicillin (AMOXIL) 500 MG tablet Take 4 tablets (2,000 mg total) by mouth once. Take one hour prior to dental procedure. 11/20/14  Yes Duke Salvia, MD  aspirin EC 81 MG tablet Take 1 tablet (81 mg total) by mouth daily. 09/04/13  Yes Duke Salvia, MD  atorvastatin (LIPITOR) 40 MG tablet Take 1 tablet (40 mg total) by mouth daily. 10/24/12  Yes Dolores Patty, MD  carvedilol (COREG) 25 MG tablet Take 1 tablet (25 mg total) by mouth 2 (two) times daily with a meal. 10/24/12  Yes Dolores Patty, MD  enoxaparin (LOVENOX) 80 MG/0.8ML injection Inject 0.8 mLs (80 mg total) into the skin every 12 (twelve) hours. 11/18/14  Yes Dolores Patty, MD  HYDROcodone-acetaminophen (VICODIN) 5-500 MG per tablet Take 1 tablet by mouth every 8 (eight) hours as needed for pain.    Yes Historical Provider, MD  metFORMIN (GLUCOPHAGE) 1000 MG tablet Take 500 mg by mouth 2 (two) times daily with a  meal.    Yes Historical Provider, MD  quinapril (ACCUPRIL) 40 MG tablet Take 1 tablet (40 mg total) by mouth daily. Patient taking differently: Take 20 mg by mouth daily.  10/24/12  Yes Dolores Patty, MD  spironolactone (ALDACTONE) 25 MG tablet TAKE 1 TABLET EVERY DAY 08/26/14  Yes Dolores Patty, MD  warfarin (COUMADIN) 5 MG tablet Take 7.5 mg by mouth daily.    Yes Historical Provider, MD  Choline Fenofibrate (TRILIPIX) 135 MG capsule Take 1 capsule (135 mg total) by mouth daily. Patient not taking: Reported on 11/29/2014 10/24/12   Dolores Patty, MD  digoxin 62.5 MCG TABS Take 0.0625 mg by mouth daily. Patient not taking:  Reported on 11/29/2014 10/24/14   Duke Salvia, MD  pantoprazole (PROTONIX) 40 MG tablet Take 1 tablet (40 mg total) by mouth daily. Patient not taking: Reported on 11/29/2014 09/20/12   Bevelyn Buckles Bensimhon, MD   BP 141/75 mmHg  Pulse 88  Temp(Src) 98.2 F (36.8 C) (Oral)  Resp 16  SpO2 100%   Physical Exam  Constitutional: She is oriented to person, place, and time. She appears well-developed and well-nourished. No distress.  Nontoxic/nonseptic appearing  HENT:  Head: Normocephalic and atraumatic.  Mouth/Throat: Uvula is midline and oropharynx is clear and moist. No trismus in the jaw. Abnormal dentition.    Eyes: Conjunctivae and EOM are normal.  Neck: Normal range of motion.  Cardiovascular: Normal rate, regular rhythm and intact distal pulses.   Pulmonary/Chest: Effort normal. No respiratory distress. She has no wheezes.  Respirations even and unlabored  Musculoskeletal: Normal range of motion.  Neurological: She is alert and oriented to person, place, and time. She exhibits normal muscle tone. Coordination normal.  Skin: Skin is warm and dry. No rash noted. She is not diaphoretic. No erythema. No pallor.  Psychiatric: She has a normal mood and affect. Her behavior is normal.  Nursing note and vitals reviewed.   ED Course  Wound repair Date/Time: 11/29/2014 6:39 AM Performed by: Antony Madura Authorized by: Antony Madura Consent: The procedure was performed in an emergent situation. Verbal consent obtained. Written consent not obtained. Risks and benefits: risks, benefits and alternatives were discussed Consent given by: patient Patient understanding: patient states understanding of the procedure being performed Patient consent: the patient's understanding of the procedure matches consent given Procedure consent: procedure consent matches procedure scheduled Relevant documents: relevant documents present and verified Test results: test results available and properly  labeled Site marked: the operative site was marked Imaging studies: imaging studies available Required items: required blood products, implants, devices, and special equipment available Patient identity confirmed: verbally with patient and arm band Time out: Immediately prior to procedure a "time out" was called to verify the correct patient, procedure, equipment, support staff and site/side marked as required. Preparation: Patient was prepped and draped in the usual sterile fashion. Local anesthesia used: yes Anesthesia: local infiltration Local anesthetic: lidocaine 2% with epinephrine Anesthetic total: 1.5 ml Patient sedated: no Comments: Extensive wound repair s/p multiple tooth extractions. Oral bleeding controlled with lidocaine, TXA (aerosolized and applied topically), silver nitrate, Afrin soaked sponges, and localized compression with tea bags. Patient tolerated procedure well.   (including critical care time) Labs Review Labs Reviewed  CBC - Abnormal; Notable for the following:    RBC 3.46 (*)    Hemoglobin 10.6 (*)    HCT 31.8 (*)    All other components within normal limits  PROTIME-INR - Abnormal; Notable for the following:  Prothrombin Time 22.5 (*)    INR 1.99 (*)    All other components within normal limits    Imaging Review No results found.   I have personally reviewed and evaluated these images and lab results as part of my medical decision-making.   EKG Interpretation None       CRITICAL CARE Performed by: Antony Madura   Total critical care time: 40  Critical care time was exclusive of separately billable procedures and treating other patients.  Critical care was necessary to treat or prevent imminent or life-threatening deterioration.  Critical care was time spent personally by me on the following activities: development of treatment plan with patient and/or surrogate as well as nursing, discussions with consultants, evaluation of patient's  response to treatment, examination of patient, obtaining history from patient or surrogate, ordering and performing treatments and interventions, ordering and review of laboratory studies, ordering and review of radiographic studies, pulse oximetry and re-evaluation of patient's condition.   Medications  lidocaine-EPINEPHrine-tetracaine (LET) solution (not administered)  tranexamic acid (CYKLOKAPRON) injection 500 mg (500 mg Topical Given 11/29/14 0508)  oxymetazoline (AFRIN) 0.05 % nasal spray 1 spray (1 spray Each Nare Given by Other 11/29/14 0510)  lidocaine-EPINEPHrine (XYLOCAINE W/EPI) 2 %-1:200000 (PF) injection 10 mL (10 mLs Infiltration Given by Other 11/29/14 4098)    MDM   Final diagnoses:  Oral bleeding  S/P tooth extraction, unspecified edentulism    69 year old female presents to the emergency department for evaluation of oral bleeding after tooth extraction. Patient on Coumadin for MVR. Hemoglobin is stable compared to prior workups. INR is 1.99.  Patient required extensive bleeding control. Afrin soaked sponges were initially tried with localized pressure. Patient next was given aerosolized TXA, but this provided little improvement. TXA soaked sponges were next applied locally with pressure. This slowed the bleeding at which time a local injection was performed with lidocaine with epinephrine. Silver nitrate sticks used at this time and pressure was again initiated with tea bags. This has significantly slowed the bleeding to near cessation. Patient to be reassessed by my attending, Dr. Norlene Campbell, with whom this patien's visit was shared. Disposition to be set by my attending.   Filed Vitals:   11/29/14 0322 11/29/14 0330 11/29/14 0520  BP: 131/81 121/81 141/75  Pulse: 102 54 88  Temp: 98.2 F (36.8 C)    TempSrc: Oral    Resp: 16 16 16   SpO2: 100% 99% 100%       Antony Madura, PA-C 11/29/14 1191  Marisa Severin, MD 11/29/14 [phone]

## 2014-11-29 NOTE — ED Notes (Signed)
Pt went to dentist and had teeth pulled - when pt took out partial yesterday morning, mouth started bleeding. Pt is on coumadin.

## 2014-11-29 NOTE — Discharge Instructions (Signed)
Continue your Coumadin.  Should you have any further bleeding, moisten 1-2 teabags in plenty of water and place them over the bleeding area.  Make sure to pack them tight against the gum and then bite down, applying pressure on the area.  Leave the teabags in place for 30 minutes and then check to see if the bleeding has subsided.  If you continue bleeding, please return to the emergency department.   Anticoagulation, Generic Anticoagulants are medicines used to prevent clots from developing in your veins. These medicine are also known as blood thinners. If blood clots are untreated, they could travel to your lungs. This is called a pulmonary embolus. A blood clot in your lungs can be fatal.  Health care providers often use anticoagulants to prevent clots following surgery. Anticoagulants are also used along with aspirin when the heart is not getting enough blood. Another anticoagulant called warfarin is started 2 to 3 days after a rapid-acting injectable anticoagulant is started. The rapid-acting anticoagulants are usually continued until warfarin has begun to work. Your health care provider will judge this length of time by blood tests known as the prothrombin time (PT) and International Normalization Ratio (INR). This means that your blood is at the necessary and best level to prevent clots. RISKS AND COMPLICATIONS  If you have received recent epidural anesthesia, spinal anesthesia, or a spinal tap while receiving anticoagulants, you are at risk for developing a blood clot in or around the spine. This condition could result in long-term or permanent paralysis.  Because anticoagulants thin your blood, severe bleeding may occur from any tissue or organ. Symptoms of the blood being too thin may include:  Bleeding from the nose or gums that does not stop quickly.  Blood in bowel movements which may appear as bright red, dark, or black tarry stools.  Blood in the urine which may appear as pink, red,  or brown urine.  Unusual bruising or bruising easily.  A cut that does not stop bleeding within 10 minutes.  Vomiting blood or continuous nausea for more than 1 day.  Coughing up blood.  Broken blood vessels in your eye (subconjunctival hemorrhage).  Abdominal or back pain with or without flank bruising.  Sudden, severe headache.  Sudden weakness or numbness of the face, arm, or leg, especially on one side of the body.  Sudden confusion.  Trouble speaking (aphasia) or understanding.  Sudden trouble seeing in one or both eyes.  Sudden trouble walking.  Dizziness.  Loss of balance or coordination.  Vaginal bleeding.  Swelling or pain at an injection site.  Superficial fat tissue death (necrosis) which may cause skin scarring. This is more common in women and may first present as pain in the waist, thighs, or buttocks.  Fever.  Too little anticoagulation continues to allow the risk for blood clots. HOME CARE INSTRUCTIONS   Due to the complications of anticoagulants, it is very important that you take your anticoagulant as directed by your health care provider. Anticoagulants need to be taken exactly as instructed. Be sure you understand all your anticoagulant instructions.  Keep all follow-up appointments with your health care provider as directed. It is very important to keep your appointments. Not keeping appointments could result in a chronic or permanent injury, pain, or disability.  Warfarin. Your health care provider will advise you on the length of treatment (usually 3-6 months, sometimes lifelong).  Take warfarin exactly as directed by your health care provider. It is recommended that you take your warfarin  dose at the same time of the day. It is preferred that you take warfarin in the late afternoon. If you have been told to stop taking warfarin, do not resume taking warfarin until directed to do so by your health care provider. Follow your health care provider's  instructions if you accidentally take an extra dose or miss a dose of warfarin. It is very important to take warfarin as directed since bleeding or blood clots could result in chronic or permanent injury, pain, or disability.  Too much and too little warfarin are both dangerous. Too much warfarin increases the risk of bleeding. Too little warfarin continues to allow the risk for blood clots. While taking warfarin, you will need to have regular blood tests to measure your blood clotting time. These blood tests usually include both the prothrombin time (PT) and International Normalized Ratio (INR) tests. The PT and INR results allow your health care provider to adjust your dose of warfarin. The dose can change for many reasons. It is critically important that you have your PT and INR levels drawn exactly as directed. Your warfarin dose may stay the same or change depending on what the PT and INR results are. Be sure to follow up with your health care provider regarding your PT and INR test results and what your warfarin dosage should be.  Many medicines can interfere with warfarin and affect the PT and INR results. You must tell your health care provider about any and all medicines you take, this includes all vitamins and supplements. Ask your health care provider before taking these. Prescription and over-the-counter medicine consistency is critical to warfarin management. It is important that potential interactions are checked before you start a new medicine. Be especially cautious with aspirin and anti-inflammatory medicines. Ask your health care provider before taking these. Medicines such as antibiotics and acid-reducing medicine can interact with warfarin and can cause an increased warfarin effect. Warfarin can also interfere with the effectiveness of medicines you are taking. Do not take or discontinue any prescribed or over-the-counter medicine except on the advice of your health care provider or  pharmacist.  Some vitamins, supplements, and herbal products interfere with the effectiveness of warfarin. Vitamin E may increase the anticoagulant effects of warfarin. Vitamin K may can cause warfarin to be less effective. Do not take or discontinue any vitamin, supplement, or herbal product except on the advice of your health care provider or pharmacist.  Eat what you normally eat and keep the vitamin K content of your diet consistent. Avoid major changes in your diet, or notify your health care provider before changing your diet. Suddenly getting a lot more vitamin K could cause your blood to clot too quickly. A sudden decrease in vitamin K intake could cause your blood to clot too slowly. These changes in vitamin K intake could lead to dangerous blood clotsor to bleeding. To keep your vitamin K intake consistent, you must be aware of which foods contain moderate or high amounts of vitamin K. Some foods high in vitamin K include spinach, kale, broccoli, cabbage, greens, Brussels sprouts, asparagus, Bok [place]oy, coleslaw, parsley, and green tea. Arrange a visit with a dietitian to answer your questions.  If you have a loss of appetite or get the stomach flu (viral gastroenteritis), talk to your health care provider as soon as possible. A decrease in your normal vitamin K intake can make you more sensitive to your usual dose of warfarin.  Some medical conditions may increase your risk  for bleeding while you are taking warfarin. A fever, diarrhea lasting more than a day, worsening heart failure, or worsening liver function are some medical conditions that could affect warfarin. Contact your health care provider if you have any of these medical conditions.  Alcohol can change the body's ability to handle warfarin. It is best to avoid alcoholic drinks or consume only very small amounts while taking warfarin. Notify your health care provider if you change your alcohol intake. A sudden increase in alcohol use  can increase your risk of bleeding. Chronic alcohol use can cause warfarin to be less effective.  Be careful not to cut yourself when using sharp objects or while shaving.  Inform all your health care providers and your dentist that you take an anticoagulant.  Limit physical activities or sports that could result in a fall or cause injury. Avoid contact sports.  Wear medical alert jewelry or carry a medical alert card. SEEK IMMEDIATE MEDICAL CARE IF:  You cough up blood.  You have dark or black stools or there is bright red blood coming from your rectum.  You vomit blood or have nausea for more than 1 day.  You have blood in the urine or pink colored urine.  You have unusual bruising or have increased bruising.  You have bleeding from the nose or gums that does not stop quickly.  You have a cut that does not stop bleeding within a 2-3 minutes.  You have sudden weakness or numbness of the face, arm, or leg, especially on one side of the body.  You have sudden confusion.  You have trouble speaking (aphasia) or understanding.  You have sudden trouble seeing in one or both eyes.  You have sudden trouble walking.  You have dizziness.  You have a loss of balance or coordination.  You have a sudden, severe headache.  You have a serious fall or head injury, even if you are not bleeding.  You have swelling or pain at an injection site.  You have unexplained tenderness or pain in the abdomen, back, waist, thighs or buttocks.  You have a fever. Any of these symptoms may represent a serious problem that is an emergency. Do not wait to see if the symptoms will go away. Get medical help right away. Call your local emergency services (911 in [place] Do not drive yourself to the hospital. Document Released: 03/28/2005 Document Revised: 04/02/2013 Document Reviewed: 10/31/2007 [facility] Patient Information 2015 [place], [place]. This information is not intended to replace advice given  to you by your health care provider. Make sure you discuss any questions you have with your health care provider.

## 2014-12-01 ENCOUNTER — Ambulatory Visit (INDEPENDENT_AMBULATORY_CARE_PROVIDER_SITE_OTHER): Payer: Commercial Managed Care - HMO | Admitting: Pharmacist

## 2014-12-01 DIAGNOSIS — Z954 Presence of other heart-valve replacement: Secondary | ICD-10-CM | POA: Diagnosis not present

## 2014-12-01 DIAGNOSIS — I059 Rheumatic mitral valve disease, unspecified: Secondary | ICD-10-CM | POA: Diagnosis not present

## 2014-12-01 DIAGNOSIS — Z5181 Encounter for therapeutic drug level monitoring: Secondary | ICD-10-CM

## 2014-12-01 DIAGNOSIS — Z7901 Long term (current) use of anticoagulants: Secondary | ICD-10-CM | POA: Diagnosis not present

## 2014-12-01 DIAGNOSIS — Z952 Presence of prosthetic heart valve: Secondary | ICD-10-CM

## 2014-12-01 LAB — POCT INR: INR: 4.5

## 2014-12-03 ENCOUNTER — Encounter (HOSPITAL_COMMUNITY): Payer: Self-pay

## 2014-12-03 ENCOUNTER — Inpatient Hospital Stay (HOSPITAL_COMMUNITY)
Admission: EM | Admit: 2014-12-03 | Discharge: 2014-12-06 | DRG: 920 | Disposition: A | Discharge status: Home / Self Care | Payer: Commercial Managed Care - HMO | Attending: Internal Medicine | Admitting: Internal Medicine

## 2014-12-03 DIAGNOSIS — I272 Other secondary pulmonary hypertension: Secondary | ICD-10-CM | POA: Diagnosis present

## 2014-12-03 DIAGNOSIS — I429 Cardiomyopathy, unspecified: Secondary | ICD-10-CM | POA: Diagnosis present

## 2014-12-03 DIAGNOSIS — T8119XD Other postprocedural shock, subsequent encounter: Secondary | ICD-10-CM | POA: Diagnosis present

## 2014-12-03 DIAGNOSIS — Z87891 Personal history of nicotine dependence: Secondary | ICD-10-CM

## 2014-12-03 DIAGNOSIS — K2980 Duodenitis without bleeding: Secondary | ICD-10-CM | POA: Diagnosis present

## 2014-12-03 DIAGNOSIS — T8119XS Other postprocedural shock, sequela: Secondary | ICD-10-CM | POA: Diagnosis not present

## 2014-12-03 DIAGNOSIS — I5022 Chronic systolic (congestive) heart failure: Secondary | ICD-10-CM | POA: Diagnosis present

## 2014-12-03 DIAGNOSIS — IMO0002 Reserved for concepts with insufficient information to code with codable children: Secondary | ICD-10-CM | POA: Diagnosis present

## 2014-12-03 DIAGNOSIS — E785 Hyperlipidemia, unspecified: Secondary | ICD-10-CM | POA: Diagnosis present

## 2014-12-03 DIAGNOSIS — Z7982 Long term (current) use of aspirin: Secondary | ICD-10-CM

## 2014-12-03 DIAGNOSIS — E119 Type 2 diabetes mellitus without complications: Secondary | ICD-10-CM | POA: Diagnosis present

## 2014-12-03 DIAGNOSIS — E1165 Type 2 diabetes mellitus with hyperglycemia: Secondary | ICD-10-CM | POA: Diagnosis present

## 2014-12-03 DIAGNOSIS — I059 Rheumatic mitral valve disease, unspecified: Secondary | ICD-10-CM | POA: Diagnosis present

## 2014-12-03 DIAGNOSIS — D649 Anemia, unspecified: Secondary | ICD-10-CM | POA: Diagnosis present

## 2014-12-03 DIAGNOSIS — K922 Gastrointestinal hemorrhage, unspecified: Secondary | ICD-10-CM | POA: Diagnosis not present

## 2014-12-03 DIAGNOSIS — K250 Acute gastric ulcer with hemorrhage: Secondary | ICD-10-CM | POA: Diagnosis not present

## 2014-12-03 DIAGNOSIS — K259 Gastric ulcer, unspecified as acute or chronic, without hemorrhage or perforation: Secondary | ICD-10-CM | POA: Diagnosis present

## 2014-12-03 DIAGNOSIS — T8119XA Other postprocedural shock, initial encounter: Principal | ICD-10-CM | POA: Diagnosis present

## 2014-12-03 DIAGNOSIS — K625 Hemorrhage of anus and rectum: Secondary | ICD-10-CM | POA: Diagnosis not present

## 2014-12-03 DIAGNOSIS — I10 Essential (primary) hypertension: Secondary | ICD-10-CM | POA: Diagnosis present

## 2014-12-03 DIAGNOSIS — Z7901 Long term (current) use of anticoagulants: Secondary | ICD-10-CM | POA: Diagnosis not present

## 2014-12-03 DIAGNOSIS — R531 Weakness: Secondary | ICD-10-CM | POA: Diagnosis present

## 2014-12-03 DIAGNOSIS — Z952 Presence of prosthetic heart valve: Secondary | ICD-10-CM | POA: Diagnosis present

## 2014-12-03 DIAGNOSIS — I428 Other cardiomyopathies: Secondary | ICD-10-CM | POA: Diagnosis not present

## 2014-12-03 DIAGNOSIS — K921 Melena: Secondary | ICD-10-CM | POA: Diagnosis present

## 2014-12-03 LAB — CBC
HCT: 17.7 % — ABNORMAL LOW (ref 36.0–46.0)
Hemoglobin: 5.9 g/dL — CL (ref 12.0–15.0)
MCH: 31.2 pg (ref 26.0–34.0)
MCHC: 33.3 g/dL (ref 30.0–36.0)
MCV: 93.7 fL (ref 78.0–100.0)
Platelets: 253 K/uL (ref 150–400)
RBC: 1.89 MIL/uL — ABNORMAL LOW (ref 3.87–5.11)
RDW: 13.4 % (ref 11.5–15.5)
WBC: 10 K/uL (ref 4.0–10.5)

## 2014-12-03 LAB — COMPREHENSIVE METABOLIC PANEL WITH GFR
ALT: 22 U/L (ref 14–54)
AST: 24 U/L (ref 15–41)
Albumin: 3.3 g/dL — ABNORMAL LOW (ref 3.5–5.0)
Alkaline Phosphatase: 41 U/L (ref 38–126)
Anion gap: 11 (ref 5–15)
BUN: 55 mg/dL — ABNORMAL HIGH (ref 6–20)
CO2: 23 mmol/L (ref 22–32)
Calcium: 8.7 mg/dL — ABNORMAL LOW (ref 8.9–10.3)
Chloride: 101 mmol/L (ref 101–111)
Creatinine, Ser: 2.02 mg/dL — ABNORMAL HIGH (ref 0.44–1.00)
GFR calc Af Amer: 27 mL/min — ABNORMAL LOW
GFR calc non Af Amer: 24 mL/min — ABNORMAL LOW
Glucose, Bld: 199 mg/dL — ABNORMAL HIGH (ref 65–99)
Potassium: 3.9 mmol/L (ref 3.5–5.1)
Sodium: 135 mmol/L (ref 135–145)
Total Bilirubin: 0.4 mg/dL (ref 0.3–1.2)
Total Protein: 5.8 g/dL — ABNORMAL LOW (ref 6.5–8.1)

## 2014-12-03 LAB — TROPONIN I: Troponin I: <0.03 ng/mL (ref <0.031)

## 2014-12-03 LAB — PROTIME-INR
INR: 2.08 — AB (ref 0.00–1.49)
PROTHROMBIN TIME: 23.3 s — AB (ref 11.6–15.2)

## 2014-12-03 LAB — PREPARE RBC (CROSSMATCH)

## 2014-12-03 MED ORDER — TRANEXAMIC ACID 1000 MG/10ML IV SOLN
500.0000 mg | Freq: Once | INTRAVENOUS | Status: AC
  Administered 2014-12-03: 500 mg via TOPICAL
  Filled 2014-12-03: qty 10

## 2014-12-03 MED ORDER — SODIUM CHLORIDE 0.9 % IV BOLUS (SEPSIS)
500.0000 mL | Freq: Once | INTRAVENOUS | Status: AC
  Administered 2014-12-03: 500 mL via INTRAVENOUS

## 2014-12-03 MED ORDER — SODIUM CHLORIDE 0.9 % IV SOLN
10.0000 mL/h | Freq: Once | INTRAVENOUS | Status: AC
  Administered 2014-12-03: 10 mL/h via INTRAVENOUS

## 2014-12-03 MED ORDER — SODIUM CHLORIDE 0.9 % IV SOLN
INTRAVENOUS | Status: DC
  Administered 2014-12-04: 02:00:00 via INTRAVENOUS

## 2014-12-03 NOTE — ED Notes (Signed)
Pt is here for tarry stools since Tuesday and feels like every time she goes to stand up she is going to pass out. Pt is taking coumadin also. Pt had some teeth pulled last Wednesday and is still spitting up some blood. Having mouth pain.

## 2014-12-03 NOTE — ED Provider Notes (Signed)
CSN: [id number]     Arrival date & time 12/03/14  2213 History  This chart was scribed for Loren Racer, MD by Lyndel Safe, ED Scribe. This patient was seen in room A07C/A07C and the patient's care was started 11:06 PM.   Chief Complaint  Patient presents with  . Melena  . Near Syncope    The history is provided by the patient and a relative. No language interpreter was used.   HPI Comments: Deborah [name] is a 69 y.o. female, with a PMhx of CHF, MR, HTN, and DM, who is on coumadin s/p MVR with mechanical valve, presents to the Emergency Department complaining of constant, moderate weakness and near-syncope upon standing up. Deborah [name] was evaluated in the ED 6 days ago for persistent dental bleeding s/p 4 dental extractions that were performed 7 days ago. Per sister, the pt has experienced continued bleeding since the surgery and has also not had an appetite since the procedure.  The pt additionally complains of melena that began 8 days ago. Pt is currently taking coumadin daily. The pt denies having had a blood transfusion in the past. Additionally denies abdominal pain, chest pain or light-headedness with lying down.   Past Medical History  Diagnosis Date  . CHF (congestive heart failure)     --Non-ischemic CM EF 30-35% by echo 9/09 (Previous 25%)  --s/p St. Jude single chamber ICD 04/09 --Minimal  CAD by cath 2004 LAD 20% LCX 20% Ramus 30% RCA nl --echo 05/11 EF 20-25%  . MR (congenital mitral regurgitation)     Severe MR s/p St. Jude mechanical MVR by Gasper Lloyd 2005  . Aortic stenosis, mild     mean gradient echo 09/09  . Diabetes mellitus     type 2  . Kidney stones   . Hyperlipidemia   . Hypertension    Past Surgical History  Procedure Laterality Date  . Appendectomy  1979  . Cardiac defibrillator placement  2009  . Insert / replace / remove pacemaker    . Cardiac valve surgery  2005   Family History  Problem Relation Age of Onset  . Heart disease Mother    Social  History  Substance Use Topics  . Smoking status: Former Smoker    Quit date: 06/16/1984  . Smokeless tobacco: Never Used  . Alcohol Use: No   OB History    No data available     Review of Systems  Constitutional: Positive for fatigue. Negative for fever and chills.  Respiratory: Negative for chest tightness and shortness of breath.   Cardiovascular: Positive for near-syncope. Negative for chest pain, palpitations and leg swelling.  Gastrointestinal: Negative for nausea, vomiting, abdominal pain, diarrhea and constipation.  Musculoskeletal: Negative for back pain, neck pain and neck stiffness.  Skin: Negative for rash and wound.  Neurological: Positive for dizziness, weakness (generalized) and light-headedness. Negative for syncope, numbness and headaches.  All other systems reviewed and are negative.   Allergies  Strawberry  Home Medications   Prior to Admission medications   Medication Sig Start Date End Date Taking? Authorizing Provider  aspirin EC 81 MG tablet Take 1 tablet (81 mg total) by mouth daily. 09/04/13  Yes Duke Salvia, MD  atorvastatin (LIPITOR) 40 MG tablet Take 1 tablet (40 mg total) by mouth daily. 10/24/12  Yes Dolores Patty, MD  carvedilol (COREG) 25 MG tablet Take 1 tablet (25 mg total) by mouth 2 (two) times daily with a meal. 10/24/12  Yes Bevelyn Buckles  Bensimhon, MD  Choline Fenofibrate (TRILIPIX) 135 MG capsule Take 1 capsule (135 mg total) by mouth daily. 10/24/12  Yes Dolores Patty, MD  HYDROcodone-acetaminophen (VICODIN) 5-500 MG per tablet Take 1 tablet by mouth every 8 (eight) hours as needed for pain.    Yes Historical Provider, MD  metFORMIN (GLUCOPHAGE) 1000 MG tablet Take 1,000 mg by mouth 2 (two) times daily with a meal.    Yes Historical Provider, MD  quinapril (ACCUPRIL) 40 MG tablet Take 1 tablet (40 mg total) by mouth daily. 10/24/12  Yes Dolores Patty, MD  spironolactone (ALDACTONE) 25 MG tablet TAKE 1 TABLET EVERY DAY 08/26/14  Yes  Dolores Patty, MD  warfarin (COUMADIN) 5 MG tablet Take 7.5 mg by mouth daily.    Yes Historical Provider, MD  amoxicillin (AMOXIL) 500 MG tablet Take 4 tablets (2,000 mg total) by mouth once. Take one hour prior to dental procedure. 11/20/14   Duke Salvia, MD  digoxin 62.5 MCG TABS Take 0.0625 mg by mouth daily. Patient not taking: Reported on 11/29/2014 10/24/14   Duke Salvia, MD  pantoprazole (PROTONIX) 40 MG tablet Take 1 tablet (40 mg total) by mouth daily. Patient not taking: Reported on 11/29/2014 09/20/12   Dolores Patty, MD   BP 69/33 mmHg  Pulse 76  Temp(Src) 97.7 F (36.5 C) (Oral)  Resp 22  Ht 5\' 3"  (1.6 m)  Wt 147 lb (66.679 kg)  BMI 26.05 kg/m2  SpO2 91% Physical Exam  Constitutional: Deborah [name] is oriented to person, place, and time. Deborah [name] appears well-developed and well-nourished. No distress.  HENT:  Head: Normocephalic and atraumatic.  Mouth/Throat: Oropharynx is clear and moist.  Patient with extraction of upper central incisors with clot present. There is a small amount of oozing around the site. Very pale mucous membranes  Eyes: EOM are normal. Pupils are equal, round, and reactive to light.  Neck: Normal range of motion. Neck supple.  Cardiovascular: Normal rate and regular rhythm.   Mechanical heart sounds  Pulmonary/Chest: Effort normal and breath sounds normal. No respiratory distress. Deborah [name] has no wheezes. Deborah [name] has no rales. Deborah [name] exhibits no tenderness.  Abdominal: Soft. Bowel sounds are normal. Deborah [name] exhibits no distension and no mass. There is no tenderness. There is no rebound and no guarding.  Musculoskeletal: Normal range of motion. Deborah [name] exhibits no edema or tenderness.  Neurological: Deborah [name] is alert and oriented to person, place, and time.  Moves all extremities without focal deficit. Sensation is intact.  Skin: Skin is warm and dry. No rash noted. No erythema.  Psychiatric: Deborah [name] has a normal mood and affect. Her behavior is normal.  Nursing note and vitals  reviewed.   ED Course  Procedures  DIAGNOSTIC STUDIES: Oxygen Saturation is 91% on RA, low by my interpretation.    COORDINATION OF CARE: 11:10 PM Discussed treatment plan with pt and daughter. Will order blood transfusion. Pt and daughter acknowledge and agree to plan.   Labs Review Labs Reviewed  COMPREHENSIVE METABOLIC PANEL - Abnormal; Notable for the following:    Glucose, Bld 199 (*)    BUN 55 (*)    Creatinine, Ser 2.02 (*)    Calcium 8.7 (*)    Total Protein 5.8 (*)    Albumin 3.3 (*)    GFR calc non Af Amer 24 (*)    GFR calc Af Amer 27 (*)    All other components within normal limits  CBC - Abnormal; Notable for the following:  RBC 1.89 (*)    Hemoglobin 5.9 (*)    HCT 17.7 (*)    All other components within normal limits  PROTIME-INR - Abnormal; Notable for the following:    Prothrombin Time 23.3 (*)    INR 2.08 (*)    All other components within normal limits  TROPONIN I  POC OCCULT BLOOD, ED  TYPE AND SCREEN  PREPARE RBC (CROSSMATCH)  ABO/RH    Imaging Review No results found. I have personally reviewed and evaluated these images and lab results as part of my medical decision-making.   EKG Interpretation   Date/Time:  Wednesday December 03 2014 22:29:52 EDT Ventricular Rate:  84 PR Interval:  138 QRS Duration: 140 QT Interval:  402 QTC Calculation: 475 R Axis:   -47 Text Interpretation:  Normal sinus rhythm Left axis deviation Non-specific  intra-ventricular conduction block Abnormal ECG Confirmed by Ranae Palms   MD, Jakaya Jacobowitz ([id number]) on 12/04/2014 12:51:50 AM     CRITICAL CARE Performed by: Ranae Palms, Gabrianna Fassnacht Total critical care time: 45 min Critical care time was exclusive of separately billable procedures and treating other patients. Critical care was necessary to treat or prevent imminent or life-threatening deterioration. Critical care was time spent personally by me on the following activities: development of treatment plan with patient and/or  surrogate as well as nursing, discussions with consultants, evaluation of patient's response to treatment, examination of patient, obtaining history from patient or surrogate, ordering and performing treatments and interventions, ordering and review of laboratory studies, ordering and review of radiographic studies, pulse oximetry and re-evaluation of patient's condition. MDM   Final diagnoses:  Postoperative hemorrhagic shock, subsequent encounter    I personally performed the services described in this documentation, which was scribed in my presence. The recorded information has been reviewed and is accurate.  Patient's blood pressure dropped into the 60s. Deborah [name] became more lethargic. Emergent released flood was given with improvement of the patient's symptoms. Patient need to have mild amount of bleeding from her gingiva.TXA -soaked gauze was placed with improvement of bleeding. Discussed with [facility] hospitalist. Will admit to step down bed. Blood pressures improved into the 90s. Patient states Deborah [name] is feeling much better.  Loren Racer, MD 12/04/14 [phone]

## 2014-12-03 NOTE — ED Notes (Signed)
MD at bedside. 

## 2014-12-04 ENCOUNTER — Inpatient Hospital Stay (HOSPITAL_COMMUNITY): Payer: Commercial Managed Care - HMO

## 2014-12-04 DIAGNOSIS — T8119XD Other postprocedural shock, subsequent encounter: Secondary | ICD-10-CM | POA: Diagnosis present

## 2014-12-04 DIAGNOSIS — E785 Hyperlipidemia, unspecified: Secondary | ICD-10-CM | POA: Diagnosis present

## 2014-12-04 DIAGNOSIS — K250 Acute gastric ulcer with hemorrhage: Secondary | ICD-10-CM | POA: Diagnosis not present

## 2014-12-04 DIAGNOSIS — I059 Rheumatic mitral valve disease, unspecified: Secondary | ICD-10-CM

## 2014-12-04 DIAGNOSIS — K922 Gastrointestinal hemorrhage, unspecified: Secondary | ICD-10-CM | POA: Diagnosis present

## 2014-12-04 DIAGNOSIS — E1165 Type 2 diabetes mellitus with hyperglycemia: Secondary | ICD-10-CM | POA: Diagnosis present

## 2014-12-04 DIAGNOSIS — K921 Melena: Secondary | ICD-10-CM | POA: Diagnosis present

## 2014-12-04 DIAGNOSIS — Z954 Presence of other heart-valve replacement: Secondary | ICD-10-CM

## 2014-12-04 DIAGNOSIS — I429 Cardiomyopathy, unspecified: Secondary | ICD-10-CM | POA: Diagnosis present

## 2014-12-04 DIAGNOSIS — T8119XA Other postprocedural shock, initial encounter: Secondary | ICD-10-CM | POA: Diagnosis present

## 2014-12-04 DIAGNOSIS — E119 Type 2 diabetes mellitus without complications: Secondary | ICD-10-CM | POA: Diagnosis not present

## 2014-12-04 DIAGNOSIS — I10 Essential (primary) hypertension: Secondary | ICD-10-CM | POA: Diagnosis present

## 2014-12-04 DIAGNOSIS — I272 Other secondary pulmonary hypertension: Secondary | ICD-10-CM | POA: Diagnosis present

## 2014-12-04 DIAGNOSIS — Z952 Presence of prosthetic heart valve: Secondary | ICD-10-CM | POA: Diagnosis present

## 2014-12-04 DIAGNOSIS — Z7901 Long term (current) use of anticoagulants: Secondary | ICD-10-CM | POA: Diagnosis not present

## 2014-12-04 DIAGNOSIS — D649 Anemia, unspecified: Secondary | ICD-10-CM | POA: Diagnosis present

## 2014-12-04 DIAGNOSIS — R531 Weakness: Secondary | ICD-10-CM | POA: Diagnosis present

## 2014-12-04 DIAGNOSIS — K2980 Duodenitis without bleeding: Secondary | ICD-10-CM | POA: Diagnosis present

## 2014-12-04 DIAGNOSIS — Z87891 Personal history of nicotine dependence: Secondary | ICD-10-CM | POA: Diagnosis not present

## 2014-12-04 DIAGNOSIS — K259 Gastric ulcer, unspecified as acute or chronic, without hemorrhage or perforation: Secondary | ICD-10-CM | POA: Diagnosis present

## 2014-12-04 DIAGNOSIS — I270 Primary pulmonary hypertension: Secondary | ICD-10-CM

## 2014-12-04 DIAGNOSIS — I5022 Chronic systolic (congestive) heart failure: Secondary | ICD-10-CM | POA: Diagnosis present

## 2014-12-04 DIAGNOSIS — T8119XS Other postprocedural shock, sequela: Secondary | ICD-10-CM | POA: Diagnosis not present

## 2014-12-04 DIAGNOSIS — I428 Other cardiomyopathies: Secondary | ICD-10-CM | POA: Diagnosis not present

## 2014-12-04 DIAGNOSIS — IMO0002 Reserved for concepts with insufficient information to code with codable children: Secondary | ICD-10-CM | POA: Diagnosis present

## 2014-12-04 DIAGNOSIS — Z7982 Long term (current) use of aspirin: Secondary | ICD-10-CM | POA: Diagnosis not present

## 2014-12-04 LAB — PROTIME-INR
INR: 2.03 — ABNORMAL HIGH (ref 0.00–1.49)
Prothrombin Time: 22.8 seconds — ABNORMAL HIGH (ref 11.6–15.2)

## 2014-12-04 LAB — LIPID PANEL
Cholesterol: 94 mg/dL (ref 0–200)
HDL: 30 mg/dL — ABNORMAL LOW (ref >40)
LDL CALC: 42 mg/dL (ref 0–99)
TRIGLYCERIDES: 110 mg/dL (ref <150)
Total CHOL/HDL Ratio: 3.1 RATIO
VLDL: 22 mg/dL (ref 0–40)

## 2014-12-04 LAB — COMPREHENSIVE METABOLIC PANEL
ALBUMIN: 2.9 g/dL — AB (ref 3.5–5.0)
ALT: 20 U/L (ref 14–54)
ANION GAP: 11 (ref 5–15)
AST: 22 U/L (ref 15–41)
Alkaline Phosphatase: 36 U/L — ABNORMAL LOW (ref 38–126)
BUN: 55 mg/dL — ABNORMAL HIGH (ref 6–20)
CHLORIDE: 105 mmol/L (ref 101–111)
CO2: 23 mmol/L (ref 22–32)
Calcium: 8.5 mg/dL — ABNORMAL LOW (ref 8.9–10.3)
Creatinine, Ser: 1.81 mg/dL — ABNORMAL HIGH (ref 0.44–1.00)
GFR calc non Af Amer: 27 mL/min — ABNORMAL LOW (ref >60)
GFR, EST AFRICAN AMERICAN: 31 mL/min — AB (ref >60)
GLUCOSE: 110 mg/dL — AB (ref 65–99)
POTASSIUM: 3.6 mmol/L (ref 3.5–5.1)
SODIUM: 139 mmol/L (ref 135–145)
Total Bilirubin: 0.6 mg/dL (ref 0.3–1.2)
Total Protein: 5.7 g/dL — ABNORMAL LOW (ref 6.5–8.1)

## 2014-12-04 LAB — CBC
HCT: 22.8 % — ABNORMAL LOW (ref 36.0–46.0)
HEMOGLOBIN: 7.6 g/dL — AB (ref 12.0–15.0)
MCH: 30.3 pg (ref 26.0–34.0)
MCHC: 33.3 g/dL (ref 30.0–36.0)
MCV: 90.8 fL (ref 78.0–100.0)
PLATELETS: 202 10*3/uL (ref 150–400)
RBC: 2.51 MIL/uL — ABNORMAL LOW (ref 3.87–5.11)
RDW: 13.7 % (ref 11.5–15.5)
WBC: 9.6 10*3/uL (ref 4.0–10.5)

## 2014-12-04 LAB — GLUCOSE, CAPILLARY
GLUCOSE-CAPILLARY: 120 mg/dL — AB (ref 65–99)
GLUCOSE-CAPILLARY: 116 mg/dL — AB (ref 65–99)
Glucose-Capillary: 109 mg/dL — ABNORMAL HIGH (ref 65–99)
Glucose-Capillary: 114 mg/dL — ABNORMAL HIGH (ref 65–99)
Glucose-Capillary: 125 mg/dL — ABNORMAL HIGH (ref 65–99)

## 2014-12-04 LAB — HEPARIN LEVEL (UNFRACTIONATED): HEPARIN UNFRACTIONATED: 0.13 [IU]/mL — AB (ref 0.30–0.70)

## 2014-12-04 LAB — BRAIN NATRIURETIC PEPTIDE: B NATRIURETIC PEPTIDE 5: 80.1 pg/mL (ref 0.0–100.0)

## 2014-12-04 LAB — TROPONIN I: Troponin I: <0.03 ng/mL (ref <0.031)

## 2014-12-04 LAB — HEMOGLOBIN AND HEMATOCRIT, BLOOD
HEMATOCRIT: 26 % — AB (ref 36.0–46.0)
HEMOGLOBIN: 8.8 g/dL — AB (ref 12.0–15.0)

## 2014-12-04 LAB — ABO/RH: ABO/RH(D): O POS

## 2014-12-04 LAB — MRSA PCR SCREENING: MRSA BY PCR: NEGATIVE

## 2014-12-04 LAB — MAGNESIUM: Magnesium: 1.8 mg/dL (ref 1.7–2.4)

## 2014-12-04 MED ORDER — ASPIRIN EC 81 MG PO TBEC
81.0000 mg | DELAYED_RELEASE_TABLET | Freq: Every day | ORAL | Status: DC
  Administered 2014-12-04 – 2014-12-06 (×3): 81 mg via ORAL
  Filled 2014-12-04 (×3): qty 1

## 2014-12-04 MED ORDER — METFORMIN HCL 500 MG PO TABS: 1000.0000 mg | ORAL_TABLET | Freq: Two times a day (BID) | ORAL | Status: DC

## 2014-12-04 MED ORDER — SODIUM CHLORIDE 0.9 % IV BOLUS (SEPSIS)
500.0000 mL | Freq: Once | INTRAVENOUS | Status: AC
  Administered 2014-12-04: 500 mL via INTRAVENOUS

## 2014-12-04 MED ORDER — ONDANSETRON HCL 4 MG/2ML IJ SOLN
4.0000 mg | Freq: Four times a day (QID) | INTRAMUSCULAR | Status: DC | PRN
Start: 2014-12-04 — End: 2014-12-06

## 2014-12-04 MED ORDER — CARVEDILOL 25 MG PO TABS
25.0000 mg | ORAL_TABLET | Freq: Two times a day (BID) | ORAL | Status: DC
  Administered 2014-12-05 – 2014-12-06 (×3): 25 mg via ORAL
  Filled 2014-12-04 (×4): qty 1

## 2014-12-04 MED ORDER — PANTOPRAZOLE SODIUM 40 MG PO TBEC: 40.0000 mg | DELAYED_RELEASE_TABLET | Freq: Every day | ORAL | Status: DC

## 2014-12-04 MED ORDER — INSULIN ASPART 100 UNIT/ML ~~LOC~~ SOLN: 0.0000 [IU] | SUBCUTANEOUS | Status: DC

## 2014-12-04 MED ORDER — QUINAPRIL HCL 10 MG PO TABS: 40.0000 mg | ORAL_TABLET | Freq: Every day | ORAL | Status: DC

## 2014-12-04 MED ORDER — SODIUM CHLORIDE 0.9 % IV SOLN
INTRAVENOUS | Status: DC
  Administered 2014-12-05: 500 mL via INTRAVENOUS

## 2014-12-04 MED ORDER — ONDANSETRON HCL 4 MG PO TABS: 4.0000 mg | ORAL_TABLET | Freq: Four times a day (QID) | ORAL | Status: DC | PRN

## 2014-12-04 MED ORDER — SPIRONOLACTONE 25 MG PO TABS
25.0000 mg | ORAL_TABLET | Freq: Every day | ORAL | Status: DC
  Administered 2014-12-05 – 2014-12-06 (×2): 25 mg via ORAL
  Filled 2014-12-04 (×3): qty 1

## 2014-12-04 MED ORDER — PANTOPRAZOLE SODIUM 40 MG PO TBEC
40.0000 mg | DELAYED_RELEASE_TABLET | Freq: Two times a day (BID) | ORAL | Status: DC
  Administered 2014-12-04 – 2014-12-06 (×5): 40 mg via ORAL
  Filled 2014-12-04 (×5): qty 1

## 2014-12-04 MED ORDER — LISINOPRIL 20 MG PO TABS
40.0000 mg | ORAL_TABLET | Freq: Every day | ORAL | Status: DC
  Administered 2014-12-04 – 2014-12-06 (×3): 40 mg via ORAL
  Filled 2014-12-04 (×3): qty 2

## 2014-12-04 MED ORDER — HYDROCODONE-ACETAMINOPHEN 5-325 MG PO TABS: 1.0000 | ORAL_TABLET | Freq: Four times a day (QID) | ORAL | Status: DC | PRN

## 2014-12-04 MED ORDER — WARFARIN - PHYSICIAN DOSING INPATIENT: Freq: Every day | Status: DC

## 2014-12-04 MED ORDER — PNEUMOCOCCAL VAC POLYVALENT 25 MCG/0.5ML IJ INJ
0.5000 mL | INJECTION | INTRAMUSCULAR | Status: DC
  Filled 2014-12-04 (×2): qty 0.5

## 2014-12-04 MED ORDER — HEPARIN (PORCINE) IN NACL 100-0.45 UNIT/ML-% IJ SOLN
850.0000 [IU]/h | INTRAMUSCULAR | Status: DC
Start: 2014-12-04 — End: 2014-12-06
  Administered 2014-12-04: 900 [IU]/h via INTRAVENOUS
  Filled 2014-12-04 (×2): qty 250

## 2014-12-04 MED ORDER — ATORVASTATIN CALCIUM 40 MG PO TABS
40.0000 mg | ORAL_TABLET | Freq: Every day | ORAL | Status: DC
  Administered 2014-12-04 – 2014-12-05 (×2): 40 mg via ORAL
  Filled 2014-12-04 (×2): qty 1

## 2014-12-04 MED ORDER — WARFARIN SODIUM 5 MG PO TABS: 7.5000 mg | ORAL_TABLET | Freq: Every day | ORAL | Status: DC

## 2014-12-04 NOTE — Clinical Documentation Improvement (Signed)
  Internal Medicine  Can the diagnosis of acute renal failure be further specified?   Acute Renal Failure/Acute Kidney Injury  Acute Tubular Necrosis  Acute Renal Cortical Necrosis  Acute Renal Medullary Necrosis  Acute on Chronic Renal Failure  Chronic Renal Failure  Other  Clinically Undetermined  Document any associated diagnoses/conditions.   Supporting Information: BUN - 55; Creatinine 2.02; and GFR 24.   Please exercise your independent, professional judgment when responding. A specific answer is not anticipated or expected.   Thank You,  Carlyn Reichert [facility] Health Information Management [place]

## 2014-12-04 NOTE — Progress Notes (Addendum)
ANTICOAGULATION CONSULT NOTE - Initial Consult  Pharmacy Consult for Heparin Indication: mechanical mitral valve  Allergies  Allergen Reactions  . Strawberry Rash    Rash & itching.    Patient Measurements: Height: 5\' 3"  (160 cm) Weight: 151 lb 0.2 oz (68.5 kg) IBW/kg (Calculated) : 52.4 Heparin Dosing Weight: 68 kg  Vital Signs: Temp: 97.9 F (36.6 C) (08/25 0728) Temp Source: Oral (08/25 0728) BP: 104/38 mmHg (08/25 0728) Pulse Rate: 76 (08/25 0728)  Labs:  Recent Labs  12/01/14 1147 12/03/14 2236 12/04/14 0435  HGB  --  5.9* 7.6*  HCT  --  17.7* 22.8*  PLT  --  253 202  LABPROT  --  23.3* 22.8*  INR 4.5 2.08* 2.03*  CREATININE  --  2.02* 1.81*  TROPONINI  --  <0.03  --     Estimated Creatinine Clearance: 26.5 mL/min (by C-G formula based on Cr of 1.81).   Medical History: Past Medical History  Diagnosis Date  . CHF (congestive heart failure)     --Non-ischemic CM EF 30-35% by echo 9/09 (Previous 25%)  --s/p St. Jude single chamber ICD 04/09 --Minimal  CAD by cath 2004 LAD 20% LCX 20% Ramus 30% RCA nl --echo 05/11 EF 20-25%  . MR (congenital mitral regurgitation)     Severe MR s/p St. Jude mechanical MVR by Gasper Lloyd 2005  . Aortic stenosis, mild     mean gradient echo 09/09  . Diabetes mellitus     type 2  . Kidney stones   . Hyperlipidemia   . Hypertension    Assessment: 69yo female presents on 8/24 with melena and dizziness. PNH includes CHF, s/p mechanical mitral valve replacement, stenosis, DM, HLD, HTN. On coumadin 7.5mg  daily PTA for mitral MV. Last dose was taken on 8/24. INR on admit was 2.08. INR this morning at 2. Patient has been having bleeding from gums since teeth removed on 8/17. Hgb was low at 5.9 on admission, transfused 2 PRBC on 8/24 and now Hgb at 7.6. plts wnl.   Goal of Therapy:  Heparin level 0.3-0.7 units/ml (Will try to target lower range as patient has been having recent bleeding) Monitor platelets by anticoagulation  protocol: Yes   Plan:  No heparin BOLUS Start heparin gtt at 900 units/hr Check 8 hr HL Monitor daily HL, CBC, s/s of bleed  BATCHELDER,NATHAN J 12/04/2014,10:44 AM  ______________________________________________________ Rica Mote: Heparin level is subtherapeutic at 0.13 while running at 900 units/hr.  Spoke w/ RN, heparin is still infusing w/o issue and pt is not experiencing any additional bleeding complications.  Goal of Therapy:  Heparin level 0.3-0.7 units/ml (will target lower end of range as pt has been experiencing recent bleeding) Monitor platelets by anticoagulation protocol: Yes   Plan:  - Increase heparin IV to 1100 units/hr - F/u AM labs - Monitor daily HL, CBC, s/sx of bleeding - EGD planned 8/26 @ 1445 >> possibly hold heparin x4hr prior  Waynette Buttery, PharmD Clinical Pharmacist Pager: [phone] 12/04/2014 8:57 PM

## 2014-12-04 NOTE — Progress Notes (Signed)
PHARMACIST - PHYSICIAN COMMUNICATION DR:  Olevia Bowens and colleagues CONCERNING:  METFORMIN SAFE ADMINISTRATION POLICY  RECOMMENDATION: Metformin has been placed on DISCONTINUE (rejected order) STATUS and should be reordered only after any of the conditions below are ruled out.  Current safety recommendations include avoiding metformin for a minimum of 48 hours after the patient's exposure to intravenous contrast media.  DESCRIPTION:  The Pharmacy Committee has adopted a policy that restricts the use of metformin in hospitalized patients until all the following conditions have been ruled out.  Specific contraindications are:  '[x]'  eGFR is below 30 ml/minute '[]'  Planned administration of intravenous iodinated contrast media '[]'  Acute or chronic metabolic acidosis (including DKA)  '[]'  Shock, acute MI, sepsis, hypoxemia, dehydration '[]'  Heart Failure patients with low EF       Sindy Guadeloupe, [facility] 12/04/2014 2:22 AM

## 2014-12-04 NOTE — Progress Notes (Signed)
[place] TEAM 1 - Stepdown/ICU TEAM Progress Note  Deborah [name] ZOX:[id number] DOB: 06-Feb-1946 DOA: 12/03/2014 PCP: Georgianne Fick, MD  Admit HPI / Brief Narrative: 69 y.o. BF PMHx Systolic CHF, Pulmonary HTN, HLD,Congenital mitral regurgitation, S/P  Mechanical mitral valve replacement, mild aortic stenosis, DM Type 2 Uncontrolled , usually T axes,   Comes to the emergency department due to above complaint. The patient the patient had a dental procedure about a week ago and came to the emergency department next day due to persistent bleeding and was discharged home, but states the the bleeding persisted. She has been having significant dizziness, fatigue and melena for several days. Today she felt so ill that she decided to return to the emergency department. Workup in the ER shows a significant decrease in her hemoglobin level.  When seen the patient was feeling better after she was receiving her second unit of packed RBCs. She was in no acute distress.   HPI/Subjective: 8/25  A/O 4, patient states negative previous history of GI bleed. States bleeding started a couple weeks ago when she had her teeth removed noticed difficulty in obtaining hemostasis. States last couple days N/V, positive a matter emesis, positive melanoma. States positive increasing fatigue, dizziness resulted in patient coming to ED, where her hemoglobin found to be 5.9. Patient states has never received her screening colonoscopy. She believes she's had in EGD numerous years ago for unknown reason.   Assessment/Plan: Postoperative Hemorrhagic shock  -Patient is been having bleeding from her gums since teeth removed. -8/24 transfuse 2 units PRBC -Stable continue to monitor closely  Upper GI bleed -Patient with multiple episodes of a hematemeses , Melena  -Patient will require EGD, and colonoscopy (never received screening colonoscopy) -Increase Protonix to 40 mg BID -EGD in A.m.  Chronic systolic  CHF -Transfuse for hemoglobin<8 -Echocardiogram pending; last echocardiogram 2014 showed severe CHF see below  -Coreg 25 mg BID -Lisinopril 40 mg daily -Spironolactone 25 mg daily  Pulmonary hypertension -See chronic systolic CHF  Mechanical mitral valve  -Will DC warfarin however since patient has mechanical valve will need to be covered with anticoagulation -Start heparin drip per pharmacy  HLD, -Lipid panel pending  DM Type 2 Uncontrolled -Hemoglobin A1c pending -Cover with sensitive SSI     Code Status: FULL Family Communication: no family present at time of exam Disposition Plan: Per GI    Consultants: Dr. Dorena Cookey [facility])    Procedure/Significant Events: 03/19/2013 echocardiogram;- Left ventricle: mildly to moderately dilated. =-LVEF=25%. Diffuse hypokinesis. - Mitral valve: A mechanical prosthesis was present. T - Left atrium:  moderately dilated. - Pulmonary arteries: PA peak pressure: 47mm Hg (S). 8/24 transfused 2 units PRBC   Culture   Antibiotics:   DVT prophylaxis: Heparin drip   Devices    LINES / TUBES:      Continuous Infusions: . sodium chloride 75 mL/hr at 12/04/14 0224  . heparin 900 Units/hr (12/04/14 1419)    Objective: VITAL SIGNS: Temp: 98.1 F (36.7 C) (08/25 1641) Temp Source: Oral (08/25 1641) BP: 92/52 mmHg (08/25 1600) Pulse Rate: 78 (08/25 1600) SPO2; FIO2:   Intake/Output Summary (Last 24 hours) at 12/04/14 1937 Last data filed at 12/04/14 0900  Gross per 24 hour  Intake 1970.33 ml  Output    501 ml  Net 1469.33 ml     Exam: General: A/O 4,, No acute respiratory distress Eyes: Negative headache, eye pain, double vision,negative scleral hemorrhage ENT: Negative Runny nose, negative ear pain, negative tinnitus, negative  gingival bleeding, Neck:  Negative scars, masses, torticollis, lymphadenopathy, JVD Lungs: Clear to auscultation bilaterally without wheezes or crackles Cardiovascular: Regular  rate and rhythm without murmur gallop or rub normal S1 and S2 Abdomen:negative abdominal pain, negative dysphagia, nondistended, positive soft, bowel sounds, no rebound, no ascites, no appreciable mass Extremities: No significant cyanosis, clubbing, or edema bilateral lower extremities Psychiatric:  Negative depression, negative anxiety, negative fatigue, negative mania  Neurologic:  Cranial nerves II through XII intact, tongue/uvula midline, all extremities muscle strength 5/5, sensation intact throughout,  negative dysarthria, negative expressive aphasia, negative receptive aphasia.    Data Reviewed: Basic Metabolic Panel:  Recent Labs Lab 12/03/14 2236 12/04/14 0435  NA 135 139  K 3.9 3.6  CL 101 105  CO2 23 23  GLUCOSE 199* 110*  BUN 55* 55*  CREATININE 2.02* 1.81*  CALCIUM 8.7* 8.5*  MG  --  1.8   Liver Function Tests:  Recent Labs Lab 12/03/14 2236 12/04/14 0435  AST 24 22  ALT 22 20  ALKPHOS 41 36*  BILITOT 0.4 0.6  PROT 5.8* 5.7*  ALBUMIN 3.3* 2.9*   No results for input(s): LIPASE, AMYLASE in the last 168 hours. No results for input(s): AMMONIA in the last 168 hours. CBC:  Recent Labs Lab 11/29/14 0514 12/03/14 2236 12/04/14 0435 12/04/14 1020  WBC 8.6 10.0 9.6  --   HGB 10.6* 5.9* 7.6* 8.8*  HCT 31.8* 17.7* 22.8* 26.0*  MCV 91.9 93.7 90.8  --   PLT 213 253 202  --    Cardiac Enzymes:  Recent Labs Lab 12/03/14 2236 12/04/14 1212 12/04/14 1640  TROPONINI <0.03 <0.03 <0.03   BNP (last 3 results)  Recent Labs  12/04/14 1212  BNP 80.1    ProBNP (last 3 results) No results for input(s): PROBNP in the last 8760 hours.  CBG:  Recent Labs Lab 12/04/14 1415 12/04/14 1745  GLUCAP 120* 125*    Recent Results (from the past 240 hour(s))  MRSA PCR Screening     Status: None   Collection Time: 12/04/14  2:23 AM  Result Value Ref Range Status   MRSA by PCR NEGATIVE NEGATIVE Final    Comment:        The GeneXpert MRSA Assay  (FDA approved for NASAL specimens only), is one component of a comprehensive MRSA colonization surveillance program. It is not intended to diagnose MRSA infection nor to guide or monitor treatment for MRSA infections.      Studies:  Recent x-ray studies have been reviewed in detail by the Attending Physician  Scheduled Meds:  Scheduled Meds: . aspirin EC  81 mg Oral Daily  . atorvastatin  40 mg Oral q1800  . carvedilol  25 mg Oral BID WC  . insulin aspart  0-9 Units Subcutaneous 6 times per day  . lisinopril  40 mg Oral Daily  . pantoprazole  40 mg Oral BID  . [START ON 12/05/2014] pneumococcal 23 valent vaccine  0.5 mL Intramuscular Tomorrow-1000  . spironolactone  25 mg Oral Daily    Time spent on care of this patient: 40 mins   Jevaughn Degollado, Roselind Messier , MD  [facility] Office  [phone] Pager - [phone]  On-Call/Text Page:      [email or web address]      password [email or web address]  If 7PM-7AM, please contact night-coverage [email or web address] Password [facility] 12/04/2014, 7:37 PM   LOS: 0 days   Care during the described time interval was provided by me .  I have reviewed this patient's available data,  including medical history, events of note, physical examination, and all test results as part of my evaluation. I have personally reviewed and interpreted all radiology studies.   Dia Crawford, MD [phone] Pager

## 2014-12-04 NOTE — Consult Note (Signed)
[facility] Consult Note  Referring Provider: No ref. provider found Primary Care Physician:  Merrilee Seashore, MD Primary Gastroenterologist:  Dr.  Laurel Dimmer Complaint: Black stools and weakness HPI: Deborah [name] is an 69 y.o. black female  on Coumadin for mechanical mitral valve replacement who developed melenic stools beginning about a week ago. She went to the emergency room 6 days ago and was released but returned feeling weak and had a hemoglobin of 5.9 with elevated BUN relative to her creatinine. She denied any abdominal pain nausea or vomiting. She denies any recent NSAID use although she did have some dental work done about a week ago. He is transfused 2 units packed red blood cells and switched over to IV heparin. The patient states she has had an EGD in the remote past but is not sure of the findings. She does not think she is ever had a colonoscopy.  Past Medical History  Diagnosis Date  . CHF (congestive heart failure)     --Non-ischemic CM EF 30-35% by echo 9/09 (Previous 25%)  --s/p St[employer] Jude single chamber ICD 04/09 --Minimal  CAD by cath 2004 LAD 20% LCX 20% Ramus 30% RCA nl --echo 05/11 EF 20-25%  . MR (congenital mitral regurgitation)     Severe MR s/p St. Jude mechanical MVR by Gerrit Friends 2005  . Aortic stenosis, mild     mean gradient 37mHg echo 09/09  . Diabetes mellitus     type 2  . Kidney stones   . Hyperlipidemia   . Hypertension     Past Surgical History  Procedure Laterality Date  . Appendectomy  1979  . Cardiac defibrillator placement  2009  . Insert / replace / remove pacemaker    . Cardiac valve surgery  2005    Medications Prior to Admission  Medication Sig Dispense Refill  . aspirin EC 81 MG tablet Take 1 tablet (81 mg total) by mouth daily. 30 tablet 11  . atorvastatin (LIPITOR) 40 MG tablet Take 1 tablet (40 mg total) by mouth daily. 90 tablet 1  . carvedilol (COREG) 25 MG tablet Take 1 tablet (25 mg total) by mouth 2 (two) times  daily with a meal. 180 tablet 1  . Choline Fenofibrate (TRILIPIX) 135 MG capsule Take 1 capsule (135 mg total) by mouth daily. 90 capsule 1  . HYDROcodone-acetaminophen (VICODIN) 5-500 MG per tablet Take 1 tablet by mouth every 8 (eight) hours as needed for pain.     . metFORMIN (GLUCOPHAGE) 1000 MG tablet Take 1,000 mg by mouth 2 (two) times daily with a meal.     . quinapril (ACCUPRIL) 40 MG tablet Take 1 tablet (40 mg total) by mouth daily. 90 tablet 1  . spironolactone (ALDACTONE) 25 MG tablet TAKE 1 TABLET EVERY DAY 90 tablet 1  . warfarin (COUMADIN) 5 MG tablet Take 7.5 mg by mouth daily.     .Marland Kitchenamoxicillin (AMOXIL) 500 MG tablet Take 4 tablets (2,000 mg total) by mouth once. Take one hour prior to dental procedure. 4 tablet 0  . digoxin 62.5 MCG TABS Take 0.0625 mg by mouth daily. (Patient not taking: Reported on 11/29/2014) 30 tablet 6  . pantoprazole (PROTONIX) 40 MG tablet Take 1 tablet (40 mg total) by mouth daily. (Patient not taking: Reported on 11/29/2014) 30 tablet 11    Allergies:  Allergies  Allergen Reactions  . Strawberry Rash    Rash & itching.    Family History  Problem Relation Age of Onset  . Heart  disease Mother     Social History:  reports that she quit smoking about 30 years ago. She has never used smokeless tobacco. She reports that she does not drink alcohol or use illicit drugs.  Review of Systems: negative except as above   Blood pressure 109/59, pulse 77, temperature 98.3 F (36.8 C), temperature source Oral, resp. rate 22, height '5\' 3"'  (1.6 m), weight 68.5 kg (151 lb 0.2 oz), SpO2 100 %. Head: Normocephalic, without obvious abnormality, atraumatic Neck: no adenopathy, no carotid bruit, no JVD, supple, symmetrical, trachea midline and thyroid not enlarged, symmetric, no tenderness/mass/nodules Resp: clear to auscultation bilaterally Cardio: regular rate and rhythm, S1, S2 normal, no murmur, click, rub or gallop GI: Abdomen soft nontender nondistended  with normoactive bowel sounds no past or megaly masses or guarding. Extremities: extremities normal, atraumatic, no cyanosis or edema  Results for orders placed or performed during the hospital encounter of 12/03/14 (from the past 48 hour(s))  Comprehensive metabolic panel     Status: Abnormal   Collection Time: 12/03/14 10:36 PM  Result Value Ref Range   Sodium 135 135 - 145 mmol/L   Potassium 3.9 3.5 - 5.1 mmol/L   Chloride 101 101 - 111 mmol/L   CO2 23 22 - 32 mmol/L   Glucose, Bld 199 (H) 65 - 99 mg/dL   BUN 55 (H) 6 - 20 mg/dL   Creatinine, Ser 2.02 (H) 0.44 - 1.00 mg/dL   Calcium 8.7 (L) 8.9 - 10.3 mg/dL   Total Protein 5.8 (L) 6.5 - 8.1 g/dL   Albumin 3.3 (L) 3.5 - 5.0 g/dL   AST 24 15 - 41 U/L   ALT 22 14 - 54 U/L   Alkaline Phosphatase 41 38 - 126 U/L   Total Bilirubin 0.4 0.3 - 1.2 mg/dL   GFR calc non Af Amer 24 (L) >60 mL/min   GFR calc Af Amer 27 (L) >60 mL/min    Comment: (NOTE) The eGFR has been calculated using the CKD EPI equation. This calculation has not been validated in all clinical situations. eGFR's persistently <60 mL/min signify possible Chronic Kidney Disease.    Anion gap 11 5 - 15  CBC     Status: Abnormal   Collection Time: 12/03/14 10:36 PM  Result Value Ref Range   WBC 10.0 4.0 - 10.5 K/uL   RBC 1.89 (L) 3.87 - 5.11 MIL/uL   Hemoglobin 5.9 (LL) 12.0 - 15.0 g/dL    Comment: REPEATED TO VERIFY SPECIMEN CHECKED FOR CLOTS CRITICAL RESULT CALLED TO, READ BACK BY AND VERIFIED WITH: G MONTAGUE,RN 2308 12/03/2014 WBOND OK PER RN    HCT 17.7 (L) 36.0 - 46.0 %   MCV 93.7 78.0 - 100.0 fL   MCH 31.2 26.0 - 34.0 pg   MCHC 33.3 30.0 - 36.0 g/dL   RDW 13.4 11.5 - 15.5 %   Platelets 253 150 - 400 K/uL  Type and screen     Status: None   Collection Time: 12/03/14 10:36 PM  Result Value Ref Range   ABO/RH(D) O POS    Antibody Screen NEG    Sample Expiration 12/06/2014    Unit Number [id number]    Blood Component Type RED CELLS,LR    Unit  division 00    Status of Unit ISSUED,FINAL    Unit tag comment VERBAL ORDERS PER DR YELVERTON    Transfusion Status OK TO TRANSFUSE    Crossmatch Result COMPATIBLE    Unit Number [id number]    Blood  Component Type RBC LR PHER2    Unit division 00    Status of Unit ISSUED,FINAL    Unit tag comment VERBAL ORDERS PER DR Lita Mains    Transfusion Status OK TO TRANSFUSE    Crossmatch Result COMPATIBLE   Protime-INR - (order if Patient is taking Coumadin / Warfarin)     Status: Abnormal   Collection Time: 12/03/14 10:36 PM  Result Value Ref Range   Prothrombin Time 23.3 (H) 11.6 - 15.2 seconds   INR 2.08 (H) 0.00 - 1.49  Troponin I     Status: None   Collection Time: 12/03/14 10:36 PM  Result Value Ref Range   Troponin I <0.03 <0.031 ng/mL    Comment:        NO INDICATION OF MYOCARDIAL INJURY.   ABO/Rh     Status: None   Collection Time: 12/03/14 10:36 PM  Result Value Ref Range   ABO/RH(D) O POS   Prepare RBC     Status: None   Collection Time: 12/03/14 11:15 PM  Result Value Ref Range   Order Confirmation ORDER PROCESSED BY BLOOD BANK   MRSA PCR Screening     Status: None   Collection Time: 12/04/14  2:23 AM  Result Value Ref Range   MRSA by PCR NEGATIVE NEGATIVE    Comment:        The GeneXpert MRSA Assay (FDA approved for NASAL specimens only), is one component of a comprehensive MRSA colonization surveillance program. It is not intended to diagnose MRSA infection nor to guide or monitor treatment for MRSA infections.   CBC     Status: Abnormal   Collection Time: 12/04/14  4:35 AM  Result Value Ref Range   WBC 9.6 4.0 - 10.5 K/uL   RBC 2.51 (L) 3.87 - 5.11 MIL/uL   Hemoglobin 7.6 (L) 12.0 - 15.0 g/dL    Comment: POST TRANSFUSION SPECIMEN   HCT 22.8 (L) 36.0 - 46.0 %   MCV 90.8 78.0 - 100.0 fL   MCH 30.3 26.0 - 34.0 pg   MCHC 33.3 30.0 - 36.0 g/dL   RDW 13.7 11.5 - 15.5 %   Platelets 202 150 - 400 K/uL  Comprehensive metabolic panel     Status: Abnormal    Collection Time: 12/04/14  4:35 AM  Result Value Ref Range   Sodium 139 135 - 145 mmol/L   Potassium 3.6 3.5 - 5.1 mmol/L   Chloride 105 101 - 111 mmol/L   CO2 23 22 - 32 mmol/L   Glucose, Bld 110 (H) 65 - 99 mg/dL   BUN 55 (H) 6 - 20 mg/dL   Creatinine, Ser 1.81 (H) 0.44 - 1.00 mg/dL   Calcium 8.5 (L) 8.9 - 10.3 mg/dL   Total Protein 5.7 (L) 6.5 - 8.1 g/dL   Albumin 2.9 (L) 3.5 - 5.0 g/dL   AST 22 15 - 41 U/L   ALT 20 14 - 54 U/L   Alkaline Phosphatase 36 (L) 38 - 126 U/L   Total Bilirubin 0.6 0.3 - 1.2 mg/dL   GFR calc non Af Amer 27 (L) >60 mL/min   GFR calc Af Amer 31 (L) >60 mL/min    Comment: (NOTE) The eGFR has been calculated using the CKD EPI equation. This calculation has not been validated in all clinical situations. eGFR's persistently <60 mL/min signify possible Chronic Kidney Disease.    Anion gap 11 5 - 15  Protime-INR     Status: Abnormal   Collection Time: 12/04/14  4:35 AM  Result Value Ref Range   Prothrombin Time 22.8 (H) 11.6 - 15.2 seconds   INR 2.03 (H) 0.00 - 1.49  Magnesium     Status: None   Collection Time: 12/04/14  4:35 AM  Result Value Ref Range   Magnesium 1.8 1.7 - 2.4 mg/dL  Hemoglobin and hematocrit, blood     Status: Abnormal   Collection Time: 12/04/14 10:20 AM  Result Value Ref Range   Hemoglobin 8.8 (L) 12.0 - 15.0 g/dL   HCT 26.0 (L) 36.0 - 46.0 %   No results found.  Assessment: 1. Melana suggesting upper GI bleed in a patient requiring anticoagulation for mechanical mitral valve replacement.  Plan:  1. Patient has eaten breakfast and lunch today so unable to perform EGD today. Does not appear urgent. 2. IV PPI 3. EGD planned tomorrow, cannot get on schedule until 245. 4. Need to monitor stools and hemoglobin and transfuse further as necessary. 5. We'll see if allowable to hold IV heparin for 4 hours prior to her procedure. Quintana Canelo C 12/04/2014, 1:15 PM  Pager [phone] If no answer or after 5 PM call  [phone]

## 2014-12-04 NOTE — H&P (Signed)
[facility] History and Physical  Deborah [name] MLJ:[id number] DOB: 06-17-1945 DOA: 12/03/2014  Referring physician: Loren Racer, MD PCP: Georgianne Fick, MD   Chief Complaint: Melena and dizziness.  HPI: Deborah [name] is a 69 y.o. female with past medical history of CHF, congenital mitral regurgitation, status post mechanical mitral valve replacement, mild aortic stenosis, type 2 diabetes, usually T axes, hyperlipidemia, hypertension who comes to the emergency department due to above complaint. The patient the patient had a dental procedure about a week ago and came to the emergency department next day due to persistent bleeding and was discharged home, but states the the bleeding persisted. She has been having significant dizziness, fatigue and melena for several days. Today she felt so ill that she decided to return to the emergency department. Workup in the ER shows a significant decrease in her hemoglobin level.  When seen the patient was feeling better after she was receiving her second unit of packed RBCs. She was in no acute distress.   Review of Systems:  Constitutional:  No weight loss, night sweats, Fevers, chills, fatigue.  HEENT:  No headaches, Difficulty swallowing,Tooth/dental problems,Sore throat,  No sneezing, itching, ear ache, nasal congestion, post nasal drip,  Cardio-vascular:  No chest pain, Orthopnea, PND, swelling in lower extremities, anasarca, dizziness, palpitations  GI:  No heartburn, indigestion, abdominal pain, nausea, vomiting, diarrhea, change in bowel habits, loss of appetite  Resp:  No shortness of breath with exertion or at rest. No excess mucus, no productive cough, No non-productive cough, No coughing up of blood.No change in color of mucus.No wheezing.No chest wall deformity  Skin:  no rash or lesions.  GU:  no dysuria, change in color of urine, no urgency or frequency. No flank pain.  Musculoskeletal:  No joint pain or swelling.  No decreased range of motion. No back pain.  Psych:  No change in mood or affect. No depression or anxiety. No memory loss.   Past Medical History  Diagnosis Date  . CHF (congestive heart failure)     --Non-ischemic CM EF 30-35% by echo 9/09 (Previous 25%)  --s/p St. Jude single chamber ICD 04/09 --Minimal  CAD by cath 2004 LAD 20% LCX 20% Ramus 30% RCA nl --echo 05/11 EF 20-25%  . MR (congenital mitral regurgitation)     Severe MR s/p St. Jude mechanical MVR by Gasper Lloyd 2005  . Aortic stenosis, mild     mean gradient echo 09/09  . Diabetes mellitus     type 2  . Kidney stones   . Hyperlipidemia   . Hypertension    Past Surgical History  Procedure Laterality Date  . Appendectomy  1979  . Cardiac defibrillator placement  2009  . Insert / replace / remove pacemaker    . Cardiac valve surgery  2005   Social History:  reports that she quit smoking about 30 years ago. She has never used smokeless tobacco. She reports that she does not drink alcohol or use illicit drugs.  Allergies  Allergen Reactions  . Strawberry Rash    Rash & itching.    Family History  Problem Relation Age of Onset  . Heart disease Mother      Prior to Admission medications   Medication Sig Start Date End Date Taking? Authorizing Provider  aspirin EC 81 MG tablet Take 1 tablet (81 mg total) by mouth daily. 09/04/13  Yes Duke Salvia, MD  atorvastatin (LIPITOR) 40 MG tablet Take 1 tablet (40 mg total)  by mouth daily. 10/24/12  Yes Dolores Patty, MD  carvedilol (COREG) 25 MG tablet Take 1 tablet (25 mg total) by mouth 2 (two) times daily with a meal. 10/24/12  Yes Dolores Patty, MD  Choline Fenofibrate (TRILIPIX) 135 MG capsule Take 1 capsule (135 mg total) by mouth daily. 10/24/12  Yes Dolores Patty, MD  HYDROcodone-acetaminophen (VICODIN) 5-500 MG per tablet Take 1 tablet by mouth every 8 (eight) hours as needed for pain.    Yes Historical Provider, MD  metFORMIN (GLUCOPHAGE) 1000 MG  tablet Take 1,000 mg by mouth 2 (two) times daily with a meal.    Yes Historical Provider, MD  quinapril (ACCUPRIL) 40 MG tablet Take 1 tablet (40 mg total) by mouth daily. 10/24/12  Yes Dolores Patty, MD  spironolactone (ALDACTONE) 25 MG tablet TAKE 1 TABLET EVERY DAY 08/26/14  Yes Dolores Patty, MD  warfarin (COUMADIN) 5 MG tablet Take 7.5 mg by mouth daily.    Yes Historical Provider, MD  amoxicillin (AMOXIL) 500 MG tablet Take 4 tablets (2,000 mg total) by mouth once. Take one hour prior to dental procedure. 11/20/14   Duke Salvia, MD  digoxin 62.5 MCG TABS Take 0.0625 mg by mouth daily. Patient not taking: Reported on 11/29/2014 10/24/14   Duke Salvia, MD  pantoprazole (PROTONIX) 40 MG tablet Take 1 tablet (40 mg total) by mouth daily. Patient not taking: Reported on 11/29/2014 09/20/12   Dolores Patty, MD   Physical Exam: Filed Vitals:   12/04/14 0045 12/04/14 0100 12/04/14 0108 12/04/14 0204  BP: 91/50 93/43 95/44  96/41  Pulse: 67 70 72 68  Temp: 97.7 F (36.5 C)  97.8 F (36.6 C) 98 F (36.7 C)  TempSrc: Oral  Oral Oral  Resp: 22 23 19 14   Height:    5\' 4"  (1.626 m)  Weight:      SpO2: 100% 100% 100% 100%    Wt Readings from Last 3 Encounters:  12/03/14 66.679 kg (147 lb)  10/03/14 72.576 kg (160 lb)  01/03/14 69.854 kg (154 lb)    General:  Appears calm and comfortable Eyes: PERRL, normal lids, irises & conjunctiva ENT: grossly normal hearing, lips & tongue. Dentition is in poor state or repair. No bleeding at this time from the incisor teeth sockets. Neck: no LAD, masses or thyromegaly Cardiovascular: RRR, positive systolic murmur. No LE edema. Telemetry: SR, occasional PVCs. Respiratory: CTA bilaterally, no w/r/r. Normal respiratory effort. Abdomen: soft, ntnd Skin: no rash or induration seen on limited exam Musculoskeletal: grossly normal tone BUE/BLE Psychiatric: grossly normal mood and affect, speech fluent and appropriate Neurologic: grossly  non-focal.          Labs on Admission:  Basic Metabolic Panel:  Recent Labs Lab 12/03/14 2236  NA 135  K 3.9  CL 101  CO2 23  GLUCOSE 199*  BUN 55*  CREATININE 2.02*  CALCIUM 8.7*   Liver Function Tests:  Recent Labs Lab 12/03/14 2236  AST 24  ALT 22  ALKPHOS 41  BILITOT 0.4  PROT 5.8*  ALBUMIN 3.3*   No results for input(s): LIPASE, AMYLASE in the last 168 hours. No results for input(s): AMMONIA in the last 168 hours. CBC:  Recent Labs Lab 11/29/14 0514 12/03/14 2236  WBC 8.6 10.0  HGB 10.6* 5.9*  HCT 31.8* 17.7*  MCV 91.9 93.7  PLT 213 253   Cardiac Enzymes:  Recent Labs Lab 12/03/14 2236  TROPONINI <0.03     Echocardiogram: Study  Conclusions  - Left ventricle: The cavity size was mildly to moderately dilated. Wall thickness was normal. The estimated ejection fraction was 25%. Diffuse hypokinesis. - Mitral valve: A mechanical prosthesis was present. There is good function. - Left atrium: The atrium was moderately dilated. - Right ventricle: The cavity size was normal. Pacer wire or catheter noted in right ventricle. Systolic function was normal. - Right atrium: The atrium was mildly dilated. - Pulmonary arteries: PA peak pressure: 47mm Hg (S). - Pericardium, extracardiac: A trivial pericardial effusion was identified posterior to the heart. Transthoracic echocardiography. M-mode, complete 2D, spectral Doppler, and color Doppler. Weight: Weight: 75kg. Weight: 165lb. Blood pressure:   143/86. Patient status: Outpatient. Location: Echo laboratory.   EKG: Independently reviewed. Vent. rate 84 BPM PR interval 138 ms QRS duration 140 ms QT/QTc 402/475 ms P-R-T axes 43 -47 64 Normal sinus rhythm Left axis deviation Non-specific intra-ventricular conduction block Abnormal ECG  Assessment/Plan Principal Problem:   Postoperative hemorrhagic shock   Symptomatic anemia Active Problems:   HYPERTENSION, BENIGN Home  antihypertensives at this time until her blood pressure returns to baseline.    Mitral valve disorder. (Status post mechanical valve replacement) Continue warfarin thromboembolic prophylaxis.      Code Status: Full code. DVT Prophylaxis: On warfarin at home. Family Communication:   Deborah [name], Deborah [name] [phone]     Deborah [name],Deborah [name] Relative [phone]    Disposition Plan: Admit to stepdown to transfuse and monitor closely.  Time spent: Over 75 minutes  Bobette Mo [facility] Pager [phone].

## 2014-12-04 NOTE — Progress Notes (Signed)
Echocardiogram 2D Echocardiogram has been performed.  Deborah [name] 12/04/2014, 3:05 PM

## 2014-12-04 NOTE — Clinical Documentation Improvement (Signed)
Internal Medicine  Would you please help clarify the medical condition related to current clinical findings?  On admission, BUN - 55; Creatinine 2.02 and GFR 24   CKD Stage I - GFR greater than or equal to 90  CKD Stage II - GFR 60-89  CKD Stage III - GFR 30-59  CKD Stage IV - GFR 15-29  CKD Stage V - GFR < 15  ESRD (End Stage Renal Disease)  Other condition  Unable to clinically determine   Supporting Information: : (risk factors, signs and symptoms, diagnostics, treatment) No mention in medical record of current renal function.  Please exercise your independent, professional judgment when responding. A specific answer is not anticipated or expected.   Thank You, Carlyn Reichert [facility] Health Information Management 

## 2014-12-05 ENCOUNTER — Encounter (HOSPITAL_COMMUNITY)
Admission: EM | Disposition: A | Discharge status: Home / Self Care | Payer: Self-pay | Source: Home / Self Care | Attending: Internal Medicine

## 2014-12-05 DIAGNOSIS — E119 Type 2 diabetes mellitus without complications: Secondary | ICD-10-CM | POA: Diagnosis present

## 2014-12-05 HISTORY — PX: ESOPHAGOGASTRODUODENOSCOPY: SHX5428

## 2014-12-05 LAB — TROPONIN I: Troponin I: <0.03 ng/mL (ref <0.031)

## 2014-12-05 LAB — CBC
HCT: 23.4 % — ABNORMAL LOW (ref 36.0–46.0)
Hemoglobin: 7.8 g/dL — ABNORMAL LOW (ref 12.0–15.0)
MCH: 30.5 pg (ref 26.0–34.0)
MCHC: 33.3 g/dL (ref 30.0–36.0)
MCV: 91.4 fL (ref 78.0–100.0)
Platelets: 220 10*3/uL (ref 150–400)
RBC: 2.56 MIL/uL — ABNORMAL LOW (ref 3.87–5.11)
RDW: 14.7 % (ref 11.5–15.5)
WBC: 9.2 10*3/uL (ref 4.0–10.5)

## 2014-12-05 LAB — GLUCOSE, CAPILLARY
GLUCOSE-CAPILLARY: 85 mg/dL (ref 65–99)
GLUCOSE-CAPILLARY: 93 mg/dL (ref 65–99)
Glucose-Capillary: 97 mg/dL (ref 65–99)
Glucose-Capillary: 75 mg/dL (ref 65–99)
Glucose-Capillary: 121 mg/dL — ABNORMAL HIGH (ref 65–99)

## 2014-12-05 LAB — PREPARE RBC (CROSSMATCH)

## 2014-12-05 LAB — HEMOGLOBIN A1C
HEMOGLOBIN A1C: 5.9 % — AB (ref 4.8–5.6)
MEAN PLASMA GLUCOSE: 123 mg/dL

## 2014-12-05 LAB — PROTIME-INR
INR: 1.63 — AB (ref 0.00–1.49)
Prothrombin Time: 19.3 seconds — ABNORMAL HIGH (ref 11.6–15.2)

## 2014-12-05 LAB — HEPARIN LEVEL (UNFRACTIONATED): Heparin Unfractionated: 0.54 IU/mL (ref 0.30–0.70)

## 2014-12-05 SURGERY — EGD (ESOPHAGOGASTRODUODENOSCOPY)
Anesthesia: Moderate Sedation

## 2014-12-05 MED ORDER — SODIUM CHLORIDE 0.9 % IV SOLN
Freq: Once | INTRAVENOUS | Status: AC
  Administered 2014-12-05: 13:00:00 via INTRAVENOUS

## 2014-12-05 MED ORDER — SODIUM CHLORIDE 0.9 % IV BOLUS (SEPSIS)
1000.0000 mL | Freq: Once | INTRAVENOUS | Status: AC
  Administered 2014-12-06: 1000 mL via INTRAVENOUS

## 2014-12-05 MED ORDER — SODIUM CHLORIDE 0.9 % IV BOLUS (SEPSIS)
500.0000 mL | Freq: Once | INTRAVENOUS | Status: AC
  Administered 2014-12-05: 500 mL via INTRAVENOUS

## 2014-12-05 MED ORDER — MIDAZOLAM HCL 5 MG/ML IJ SOLN
INTRAMUSCULAR | Status: AC
  Filled 2014-12-05: qty 2

## 2014-12-05 MED ORDER — BUTAMBEN-TETRACAINE-BENZOCAINE 2-2-14 % EX AERO
INHALATION_SPRAY | CUTANEOUS | Status: DC | PRN
Start: 2014-12-05 — End: 2014-12-05
  Administered 2014-12-05: 2 via TOPICAL

## 2014-12-05 MED ORDER — SODIUM CHLORIDE 0.9 % IV SOLN
INTRAVENOUS | Status: AC
  Administered 2014-12-05: 21:00:00 via INTRAVENOUS

## 2014-12-05 MED ORDER — FENTANYL CITRATE (PF) 100 MCG/2ML IJ SOLN
INTRAMUSCULAR | Status: DC | PRN
  Administered 2014-12-05 (×2): 25 ug via INTRAVENOUS

## 2014-12-05 MED ORDER — FENTANYL CITRATE (PF) 100 MCG/2ML IJ SOLN
INTRAMUSCULAR | Status: AC
  Filled 2014-12-05: qty 2

## 2014-12-05 MED ORDER — MIDAZOLAM HCL 10 MG/2ML IJ SOLN
INTRAMUSCULAR | Status: DC | PRN
  Administered 2014-12-05 (×3): 2 mg via INTRAVENOUS

## 2014-12-05 MED ORDER — WARFARIN - PHARMACIST DOSING INPATIENT: Freq: Every day | Status: DC

## 2014-12-05 MED ORDER — WARFARIN SODIUM 5 MG PO TABS
7.5000 mg | ORAL_TABLET | Freq: Every day | ORAL | Status: DC
Start: 2014-12-05 — End: 2014-12-06
  Administered 2014-12-06: 7.5 mg via ORAL
  Filled 2014-12-05: qty 2

## 2014-12-05 MED ORDER — INSULIN ASPART 100 UNIT/ML ~~LOC~~ SOLN
0.0000 [IU] | Freq: Three times a day (TID) | SUBCUTANEOUS | Status: DC
  Administered 2014-12-06: 0 [IU] via SUBCUTANEOUS
  Administered 2014-12-06: 1 [IU] via SUBCUTANEOUS
  Administered 2014-12-06: 0 [IU] via SUBCUTANEOUS

## 2014-12-05 NOTE — Op Note (Signed)
[facility] [address] [place], [address]   ENDOSCOPY PROCEDURE REPORT  PATIENT: Deborah, [name]  MR#: [id number] BIRTHDATE: 1945/07/12 , 69  yrs. old GENDER: female ENDOSCOPIST: Dorena Cookey, MD REFERRED BY: PROCEDURE DATE:  01/04/2015 PROCEDURE: ASA CLASS: INDICATIONS:  anemia and heme positive stool the patient on blood thinners MEDICATIONS: fentanyl 50 g Versed 6 mg TOPICAL ANESTHETIC: Cetacaine spray  DESCRIPTION OF PROCEDURE: After the risks benefits and alternatives of the procedure were thoroughly explained, informed consent was obtained.  The Pentax Gastroscope [id number] endoscope was introduced through the mouth and advanced to the second portion of the duodenum , Without limitations.  The instrument was slowly withdrawn as the mucosa was fully examined. Estimated blood loss is zero unless otherwise noted in this procedure report.    esophagus: Normal Stomach: One small antral ulcer along the lesser curvature with no stigmata of hemorrhage Clean based. CLOtest obtained. Duodenum: Significant bulbar duodenitis with 1 small proximal ulcer with no stigmata of hemorrhage but bled slightly when brushed with the scope and stopped spontaneously. Distal bulb erosions with exudate. Second portion normal.              The scope was then withdrawn from the patient and the procedure completed.  COMPLICATIONS: There were no immediate complications.  ENDOSCOPIC IMPRESSION: small gastric and duodenal ulcers and erosions  RECOMMENDATIONS: intensive PPI therapy and treatment of H. pylori if CLOtest positive. Review any NSAID use and absolutely avoid them.  REPEAT EXAM:  eSigned:  Dorena Cookey, MD January 04, 2015 3:41 PM    CC:  CPT CODES: ICD CODES:  The ICD and CPT codes recommended by this software are interpretations from the data that the clinical staff has captured with the software.  The verification of the translation of this report  to the ICD and CPT codes and modifiers is the sole responsibility of the health care institution and practicing physician where this report was generated.  [employer] will not be held responsible for the validity of the ICD and CPT codes included on this report.  AMA assumes no liability for data contained or not contained herein. CPT is a Publishing rights manager of the [employer].

## 2014-12-05 NOTE — Progress Notes (Signed)
[facility] TEAM 1 - Stepdown/ICU TEAM Progress Note  KRIZIA FLIGHT ZOX:[id number] DOB: 07-28-1945 DOA: 12/03/2014 PCP: Georgianne Fick, MD  Admit HPI / Brief Narrative: 69 y.o. BF PMHx Systolic CHF, Pulmonary HTN, HLD,Congenital mitral regurgitation, S/P  Mechanical mitral valve replacement, mild aortic stenosis, DM Type 2 Uncontrolled , usually T axes,   Comes to the emergency department due to above complaint. The patient the patient had a dental procedure about a week ago and came to the emergency department next day due to persistent bleeding and was discharged home, but states the the bleeding persisted. She has been having significant dizziness, fatigue and melena for several days. Today she felt so ill that she decided to return to the emergency department. Workup in the ER shows a significant decrease in her hemoglobin level.  When seen the patient was feeling better after she was receiving her second unit of packed RBCs. She was in no acute distress.   HPI/Subjective: 8/25  A/O 4, patient states negative previous history of GI bleed. States bleeding started a couple weeks ago when she had her teeth removed noticed difficulty in obtaining hemostasis. States last couple days N/V, positive a matter emesis, positive melanoma. States positive increasing fatigue, dizziness resulted in patient coming to ED, where her hemoglobin found to be 5.9. Patient states has never received her screening colonoscopy. She believes she's had in EGD numerous years ago for unknown reason. 8/26 overnight patient's IV was accidentally discontinued by patient, currently waiting for IV be placed in order for patient to receive PRBC before EGD  Assessment/Plan: Postoperative Hemorrhagic shock  -Patient is been having bleeding from her gums since teeth removed. -8/24 transfuse 2 units PRBC -8/26 transfuse 1 unit PRBC  Upper GI bleed -Patient with multiple episodes of a hematemeses , Melena  -Patient will  require EGD, and colonoscopy (never received screening colonoscopy) -Continue Protonix to 40 mg BID -EGD complete awaiting results  Chronic systolic CHF -Transfuse for hemoglobin<8 -Echocardiogram pending; last echocardiogram 2014 showed severe CHF see below  -Coreg 25 mg BID -Lisinopril 40 mg daily -Spironolactone 25 mg daily  Pulmonary hypertension -See chronic systolic CHF  Mechanical mitral valve  -Will DC warfarin however since patient has mechanical valve will need to be covered with anticoagulation -8/26 restart heparin drip per pharmacy post EGD -Restart warfarin in a.m. per pharmacy  HLD, -Lipid panel pending  DM Type 2 controlled -8/25 Hemoglobin A1c= 5.9 -Cover with sensitive SSI     Code Status: FULL Family Communication: no family present at time of exam Disposition Plan: Per GI    Consultants: Dr. Dorena Cookey [facility] GI)    Procedure/Significant Events: 03/19/2013 echocardiogram;- Left ventricle: mildly to moderately dilated. =-LVEF=25%. Diffuse hypokinesis. - Mitral valve: A mechanical prosthesis was present. T - Left atrium:  moderately dilated. - Pulmonary arteries: PA peak pressure: 47mm Hg (S). 8/24 transfused 2 units PRBC 8/6 transfused 1 unit PRBC  Culture   Antibiotics:   DVT prophylaxis: Heparin drip   Devices    LINES / TUBES:      Continuous Infusions: . sodium chloride    . heparin 1,100 Units/hr (12/05/14 1715)    Objective: VITAL SIGNS: Temp: 98.1 F (36.7 C) (08/26 2020) Temp Source: Oral (08/26 2020) BP: 85/52 mmHg (08/26 2020) Pulse Rate: 72 (08/26 2020) SPO2; FIO2:   Intake/Output Summary (Last 24 hours) at 12/05/14 2050 Last data filed at 12/05/14 1830  Gross per 24 hour  Intake 2018.53 ml  Output    350 ml  Net 1668.53 ml     Exam: General: A/O 4,, No acute respiratory distress Eyes: Negative headache, eye pain, double vision,negative scleral hemorrhage ENT: Negative Runny nose, negative ear  pain, negative tinnitus, negative gingival bleeding, Neck:  Negative scars, masses, torticollis, lymphadenopathy, JVD Lungs: Clear to auscultation bilaterally without wheezes or crackles Cardiovascular: Regular rate and rhythm without murmur gallop or rub normal S1 and S2 Abdomen:negative abdominal pain, negative dysphagia, nondistended, positive soft, bowel sounds, no rebound, no ascites, no appreciable mass Extremities: No significant cyanosis, clubbing, or edema bilateral lower extremities Psychiatric:  Negative depression, negative anxiety, negative fatigue, negative mania  Neurologic:  Cranial nerves II through XII intact, tongue/uvula midline, all extremities muscle strength 5/5, sensation intact throughout,  negative dysarthria, negative expressive aphasia, negative receptive aphasia.    Data Reviewed: Basic Metabolic Panel:  Recent Labs Lab 12/03/14 2236 12/04/14 0435  NA 135 139  K 3.9 3.6  CL 101 105  CO2 23 23  GLUCOSE 199* 110*  BUN 55* 55*  CREATININE 2.02* 1.81*  CALCIUM 8.7* 8.5*  MG  --  1.8   Liver Function Tests:  Recent Labs Lab 12/03/14 2236 12/04/14 0435  AST 24 22  ALT 22 20  ALKPHOS 41 36*  BILITOT 0.4 0.6  PROT 5.8* 5.7*  ALBUMIN 3.3* 2.9*   No results for input(s): LIPASE, AMYLASE in the last 168 hours. No results for input(s): AMMONIA in the last 168 hours. CBC:  Recent Labs Lab 11/29/14 0514 12/03/14 2236 12/04/14 0435 12/04/14 1020 12/05/14 0207  WBC 8.6 10.0 9.6  --  9.2  HGB 10.6* 5.9* 7.6* 8.8* 7.8*  HCT 31.8* 17.7* 22.8* 26.0* 23.4*  MCV 91.9 93.7 90.8  --  91.4  PLT 213 253 202  --  220   Cardiac Enzymes:  Recent Labs Lab 12/03/14 2236 12/04/14 1212 12/04/14 1640 12/04/14 2322  TROPONINI <0.03 <0.03 <0.03 <0.03   BNP (last 3 results)  Recent Labs  12/04/14 1212  BNP 80.1    ProBNP (last 3 results) No results for input(s): PROBNP in the last 8760 hours.  CBG:  Recent Labs Lab 12/04/14 2355  12/05/14 0439 12/05/14 0802 12/05/14 1215 12/05/14 1650  GLUCAP 109* 97 85 93 75    Recent Results (from the past 240 hour(s))  MRSA PCR Screening     Status: None   Collection Time: 12/04/14  2:23 AM  Result Value Ref Range Status   MRSA by PCR NEGATIVE NEGATIVE Final    Comment:        The GeneXpert MRSA Assay (FDA approved for NASAL specimens only), is one component of a comprehensive MRSA colonization surveillance program. It is not intended to diagnose MRSA infection nor to guide or monitor treatment for MRSA infections.      Studies:  Recent x-ray studies have been reviewed in detail by the Attending Physician  Scheduled Meds:  Scheduled Meds: . aspirin EC  81 mg Oral Daily  . atorvastatin  40 mg Oral q1800  . carvedilol  25 mg Oral BID WC  . insulin aspart  0-9 Units Subcutaneous TID AC & HS  . lisinopril  40 mg Oral Daily  . pantoprazole  40 mg Oral BID  . pneumococcal 23 valent vaccine  0.5 mL Intramuscular Tomorrow-1000  . spironolactone  25 mg Oral Daily    Time spent on care of this patient: 40 mins   Gor Vestal, Roselind Messier , MD  [facility] Office  [phone] Pager [phone]  On-Call/Text Page:  [email or web address]      password [email or web address]  If 7PM-7AM, please contact night-coverage [email or web address] Password [email or web address] 12/05/2014, 8:50 PM   LOS: 1 day   Care during the described time interval was provided by me .  I have reviewed this patient's available data, including medical history, events of note, physical examination, and all test results as part of my evaluation. I have personally reviewed and interpreted all radiology studies.   Carolyne Littles, MD [phone] Pager

## 2014-12-05 NOTE — Progress Notes (Addendum)
ANTICOAGULATION CONSULT NOTE - Follow-up  Pharmacy Consult for Heparin Indication: mechanical mitral valve  Allergies  Allergen Reactions  . Strawberry Rash    Rash & itching.    Patient Measurements: Height: 5\' 3"  (160 cm) Weight: 151 lb 0.2 oz (68.5 kg) IBW/kg (Calculated) : 52.4 Heparin Dosing Weight: 68 kg  Vital Signs: Temp: 98.2 F (36.8 C) (08/26 0800) Temp Source: Oral (08/26 0800) BP: 102/51 mmHg (08/26 0800) Pulse Rate: 57 (08/26 0800)  Labs:  Recent Labs  12/03/14 2236 12/04/14 0435 12/04/14 1020 12/04/14 1212 12/04/14 1640 12/04/14 1944 12/04/14 2322 12/05/14 0207  HGB 5.9* 7.6* 8.8*  --   --   --   --  7.8*  HCT 17.7* 22.8* 26.0*  --   --   --   --  23.4*  PLT 253 202  --   --   --   --   --  220  LABPROT 23.3* 22.8*  --   --   --   --   --  19.3*  INR 2.08* 2.03*  --   --   --   --   --  1.63*  HEPARINUNFRC  --   --   --   --   --  0.13*  --  0.54  CREATININE 2.02* 1.81*  --   --   --   --   --   --   TROPONINI <0.03  --   --  <0.03 <0.03  --  <0.03  --     Estimated Creatinine Clearance: 26.5 mL/min (by C-G formula based on Cr of 1.81).  Assessment: 69yo female presents on 8/24 with melena and dizziness. On coumadin 7.5mg  daily PTA for mitral MV. Last dose was taken on 8/24. INR on admit was 2.08 but now 1.63. Started on heparin IV while coumadin is on hold. Hepairn level is therapeutic at 0.54. No bleeding noted.   Goal of Therapy:  Heparin level 0.3-0.7 units/ml  Monitor platelets by anticoagulation protocol: Yes   Plan:  - Continue heparin gtt at 1100 units/hr - Daily heparin level and CBC - f/u ability to restart warfarin  Lysle Pearl, PharmD, BCPS Pager # [phone] 12/05/2014 11:00 AM   Addendum: to resume coumadin for MVR. Plan: coumadin 7.5 mg daily Daily INR  Herby Abraham, Pharm.D. [phone] 12/05/2014 8:53 PM

## 2014-12-05 NOTE — Progress Notes (Signed)
Utilization Review Completed.  

## 2014-12-06 DIAGNOSIS — K922 Gastrointestinal hemorrhage, unspecified: Secondary | ICD-10-CM

## 2014-12-06 LAB — COMPREHENSIVE METABOLIC PANEL
ALBUMIN: 2.5 g/dL — AB (ref 3.5–5.0)
ALT: 17 U/L (ref 14–54)
AST: 17 U/L (ref 15–41)
Alkaline Phosphatase: 34 U/L — ABNORMAL LOW (ref 38–126)
Anion gap: 3 — ABNORMAL LOW (ref 5–15)
BUN: 23 mg/dL — AB (ref 6–20)
CHLORIDE: 114 mmol/L — AB (ref 101–111)
CO2: 24 mmol/L (ref 22–32)
Calcium: 7.7 mg/dL — ABNORMAL LOW (ref 8.9–10.3)
Creatinine, Ser: 1.39 mg/dL — ABNORMAL HIGH (ref 0.44–1.00)
GFR calc Af Amer: 43 mL/min — ABNORMAL LOW (ref >60)
GFR, EST NON AFRICAN AMERICAN: 37 mL/min — AB (ref >60)
GLUCOSE: 117 mg/dL — AB (ref 65–99)
POTASSIUM: 3.8 mmol/L (ref 3.5–5.1)
SODIUM: 141 mmol/L (ref 135–145)
Total Bilirubin: 0.3 mg/dL (ref 0.3–1.2)
Total Protein: 4.8 g/dL — ABNORMAL LOW (ref 6.5–8.1)

## 2014-12-06 LAB — CLOTEST (H. PYLORI), BIOPSY: HELICOBACTER SCREEN: POSITIVE — AB

## 2014-12-06 LAB — CBC
HCT: 24.1 % — ABNORMAL LOW (ref 36.0–46.0)
Hemoglobin: 7.9 g/dL — ABNORMAL LOW (ref 12.0–15.0)
MCH: 30.2 pg (ref 26.0–34.0)
MCHC: 32.8 g/dL (ref 30.0–36.0)
MCV: 92 fL (ref 78.0–100.0)
PLATELETS: 217 10*3/uL (ref 150–400)
RBC: 2.62 MIL/uL — AB (ref 3.87–5.11)
RDW: 15.1 % (ref 11.5–15.5)
WBC: 8.1 10*3/uL (ref 4.0–10.5)

## 2014-12-06 LAB — GLUCOSE, CAPILLARY
GLUCOSE-CAPILLARY: 93 mg/dL (ref 65–99)
GLUCOSE-CAPILLARY: 129 mg/dL — AB (ref 65–99)

## 2014-12-06 LAB — MAGNESIUM: MAGNESIUM: 1.6 mg/dL — AB (ref 1.7–2.4)

## 2014-12-06 LAB — PROTIME-INR
INR: 1.44 (ref 0.00–1.49)
Prothrombin Time: 17.6 seconds — ABNORMAL HIGH (ref 11.6–15.2)

## 2014-12-06 LAB — HEPARIN LEVEL (UNFRACTIONATED)
HEPARIN UNFRACTIONATED: 0.9 [IU]/mL — AB (ref 0.30–0.70)
HEPARIN UNFRACTIONATED: 0.74 [IU]/mL — AB (ref 0.30–0.70)

## 2014-12-06 LAB — HEMOGLOBIN AND HEMATOCRIT, BLOOD
HEMATOCRIT: 23.4 % — AB (ref 36.0–46.0)
HEMOGLOBIN: 7.8 g/dL — AB (ref 12.0–15.0)

## 2014-12-06 LAB — PREPARE RBC (CROSSMATCH)

## 2014-12-06 MED ORDER — SODIUM CHLORIDE 0.9 % IV BOLUS (SEPSIS)
500.0000 mL | Freq: Once | INTRAVENOUS | Status: AC
  Administered 2014-12-06: 500 mL via INTRAVENOUS

## 2014-12-06 MED ORDER — PANTOPRAZOLE SODIUM 40 MG PO TBEC: 40.0000 mg | DELAYED_RELEASE_TABLET | Freq: Every day | ORAL | Status: DC

## 2014-12-06 MED ORDER — SODIUM CHLORIDE 0.9 % IV SOLN
INTRAVENOUS | Status: DC
  Administered 2014-12-06: 1000 mL via INTRAVENOUS

## 2014-12-06 MED ORDER — PANTOPRAZOLE SODIUM 40 MG PO TBEC: 40.0000 mg | DELAYED_RELEASE_TABLET | Freq: Two times a day (BID) | ORAL | Status: DC

## 2014-12-06 MED ORDER — ONDANSETRON HCL 4 MG PO TABS: 4.0000 mg | ORAL_TABLET | Freq: Four times a day (QID) | ORAL | Status: DC | PRN

## 2014-12-06 MED ORDER — SODIUM CHLORIDE 0.9 % IV SOLN: Freq: Once | INTRAVENOUS | Status: DC

## 2014-12-06 NOTE — Progress Notes (Signed)
[facility] PROGRESS NOTE Subjective No further bleeding Hg stable.  Objective: Vital signs in last 24 hours: Temp:  [98.1 F (36.7 C)-98.8 F (37.1 C)] 98.2 F (36.8 C) (08/27 0708) Pulse Rate:  [56-95] 70 (08/27 0708) Resp:  [16-29] 22 (08/27 0708) BP: (75-135)/(35-83) 111/61 mmHg (08/27 0708) SpO2:  [97 %-100 %] 100 % (08/27 0708) Last BM Date: 12/04/14  Intake/Output from previous day: 08/26 0701 - 08/27 0700 In: 1607 [P.O.:500; I.V.:742; Blood:365] Out: 350 [Urine:350] Intake/Output this shift: Total I/O In: 300 [Blood:300] Out: -     Lab Results:  Recent Labs  12/03/14 2236 12/04/14 0435 12/04/14 1020 12/05/14 0207 12/06/14 0042  WBC 10.0 9.6  --  9.2 8.1  HGB 5.9* 7.6* 8.8* 7.8* 7.8*  7.9*  HCT 17.7* 22.8* 26.0* 23.4* 23.4*  24.1*  PLT 253 202  --  220 217   BMET  Recent Labs  12/03/14 2236 12/04/14 0435 12/06/14 0042  NA 135 139 141  K 3.9 3.6 3.8  CL 101 105 114*  CO2 23 23 24   CREATININE 2.02* 1.81* 1.39*   LFT  Recent Labs  12/03/14 2236 12/04/14 0435 12/06/14 0042  PROT 5.8* 5.7* 4.8*  AST 24 22 17   ALT 22 20 17   ALKPHOS 41 36* 34*  BILITOT 0.4 0.6 0.3   PT/INR  Recent Labs  12/04/14 0435 12/05/14 0207 12/06/14 0042  LABPROT 22.8* 19.3* 17.6*  INR 2.03* 1.63* 1.44   PANCREAS No results for input(s): LIPASE in the last 72 hours.       Studies/Results: No results found.  Medications: I have reviewed the patient's current medications.  Assessment/Plan: 1. GI Bleed. GU and DU on EGD, test for h pylori pending. Back on coumadin, continue the protonix and could be discharged when stable with follow up as OP with Dr Madilyn Fireman.   Deborah [name],Deborah [name] 12/06/2014, 8:37 AM  Pager: [phone] If no answer or after hours call [phone]

## 2014-12-06 NOTE — Progress Notes (Signed)
ANTICOAGULATION CONSULT NOTE - Follow Up Consult  Pharmacy Consult for heparin Indication: MVR   Labs:  Recent Labs  12/03/14 2236 12/04/14 0435 12/04/14 1020 12/04/14 1212 12/04/14 1640 12/04/14 1944 12/04/14 2322 12/05/14 0207 12/06/14 0042  HGB 5.9* 7.6* 8.8*  --   --   --   --  7.8* 7.8*  7.9*  HCT 17.7* 22.8* 26.0*  --   --   --   --  23.4* 23.4*  24.1*  PLT 253 202  --   --   --   --   --  220 217  LABPROT 23.3* 22.8*  --   --   --   --   --  19.3* 17.6*  INR 2.08* 2.03*  --   --   --   --   --  1.63* 1.44  HEPARINUNFRC  --   --   --   --   --  0.13*  --  0.54 0.74*  CREATININE 2.02* 1.81*  --   --   --   --   --   --  1.39*  TROPONINI <0.03  --   --  <0.03 <0.03  --  <0.03  --   --      Assessment: 69yo female now above goal on heparin after one level at goal; had lost IV access during day shift and heparin was off most of the day, resumed last pm.  Goal of Therapy:  Heparin level 0.3-0.7 units/ml   Plan:  Will decrease heparin gtt slightly to 1000 units/hr and check level in 8hr.  Vernard Gambles, PharmD, BCPS  12/06/2014,1:46 AM

## 2014-12-06 NOTE — Discharge Instructions (Signed)
Stop taking Quinapril until seen by your doctor. Please call your doctor on Monday to be seen as soon as possible for repeat blood work, CBC (complete blood count) and BMP (basic metabolic panel).  Also, you have small duodenal and gastric ulcers on endoscopy, do not take ASPIRIN, ADVIL, MOTRIN.  You were started on Protonix to take twice daily, please follow this recommendations strictly.   Call me with any questions over the weekend, Dr. Izola Price, [phone] (my cell phone)    Gastrointestinal Bleeding Gastrointestinal (GI) bleeding means there is bleeding somewhere along the digestive tract, between the mouth and anus. CAUSES  There are many different problems that can cause GI bleeding. Possible causes include:  Esophagitis. This is inflammation, irritation, or swelling of the esophagus.  Hemorrhoids.These are veins that are full of blood (engorged) in the rectum. They cause pain, inflammation, and may bleed.  Anal fissures.These are areas of painful tearing which may bleed. They are often caused by passing hard stool.  Diverticulosis.These are pouches that form on the colon over time, with age, and may bleed significantly.  Diverticulitis.This is inflammation in areas with diverticulosis. It can cause pain, fever, and bloody stools, although bleeding is rare.  Polyps and cancer. Colon cancer often starts out as precancerous polyps.  Gastritis and ulcers.Bleeding from the upper gastrointestinal tract (near the stomach) may travel through the intestines and produce black, sometimes tarry, often bad smelling stools. In certain cases, if the bleeding is fast enough, the stools may not be black, but red. This condition may be life-threatening. SYMPTOMS   Vomiting bright red blood or material that looks like coffee grounds.  Bloody, black, or tarry stools. DIAGNOSIS  Your caregiver may diagnose your condition by taking your history and performing a physical exam. More tests may  be needed, including:  X-rays and other imaging tests.  Esophagogastroduodenoscopy (EGD). This test uses a flexible, lighted tube to look at your esophagus, stomach, and small intestine.  Colonoscopy. This test uses a flexible, lighted tube to look at your colon. TREATMENT  Treatment depends on the cause of your bleeding.   For bleeding from the esophagus, stomach, small intestine, or colon, the caregiver doing your EGD or colonoscopy may be able to stop the bleeding as part of the procedure.  Inflammation or infection of the colon can be treated with medicines.  Many rectal problems can be treated with creams, suppositories, or warm baths.  Surgery is sometimes needed.  Blood transfusions are sometimes needed if you have lost a lot of blood. If bleeding is slow, you may be allowed to go home. If there is a lot of bleeding, you will need to stay in the hospital for observation. HOME CARE INSTRUCTIONS   Take any medicines exactly as prescribed.  Keep your stools soft by eating foods that are high in fiber. These foods include whole grains, legumes, fruits, and vegetables. Prunes (1 to 3 a day) work well for many people.  Drink enough fluids to keep your urine clear or pale yellow. SEEK IMMEDIATE MEDICAL CARE IF:   Your bleeding increases.  You feel lightheaded, weak, or you faint.  You have severe cramps in your back or abdomen.  You pass large blood clots in your stool.  Your problems are getting worse. MAKE SURE YOU:   Understand these instructions.  Will watch your condition.  Will get help right away if you are not doing well or get worse. Document Released: 03/25/2000 Document Revised: 03/14/2012 Document Reviewed: 03/07/2011 ExitCare Patient  Information 2015 [place], [place]. This information is not intended to replace advice given to you by your health care provider. Make sure you discuss any questions you have with your health care provider.

## 2014-12-06 NOTE — Discharge Summary (Signed)
Physician Discharge Summary  MAHATI VAJDA ZOX:[id number] DOB: 07-01-1945 DOA: 12/03/2014  PCP: Georgianne Fick, MD  Admit date: 12/03/2014 Discharge date: 12/06/2014  Recommendations for Outpatient Follow-up:  1. Pt will need to follow up with PCP in 2-3 weeks post discharge 2. Please obtain BMP to evaluate electrolytes and kidney function, K and Mg level 3. Mg was supplemented prior to discharge  And since pt wanted to go home, we have not gotten chance to repeat it 4. Please also check CBC to evaluate Hg and Hct levels 5. Please note that Quinapril was stopped until renal function stabilizes 6. Pt reported she has been off digoxin so we have removed it from her medication list  7. Pt was told she can resume coumadin per GI team  8. Pt advised to continue therapy with Protonix as per GI team and to absolutely avoid all NSAIDS, aspirin  9. H. Pylori test pending upon discharge, if CLO test positive will need to be treated, pt made aware   Discharge Diagnoses:  Principal Problem:   Postoperative hemorrhagic shock Active Problems:   HYPERTENSION, BENIGN   Mitral valve disorder. (Status post mechanical valve replacement)   Symptomatic anemia   Upper GI bleed   Chronic systolic CHF (congestive heart failure)   Pulmonary hypertension   History of mitral valve replacement with mechanical valve   HLD (hyperlipidemia)   Diabetes type 2, uncontrolled   Diabetes type 2, controlled   Discharge Condition: Stable  Diet recommendation: Heart healthy diet discussed in details   History of present illness:   Hospital Course:   Admit HPI / Brief Narrative: 69 y.o. BF PMHx Systolic CHF, Pulmonary HTN, HLD,Congenital mitral regurgitation, S/P Mechanical mitral valve replacement, mild aortic stenosis, DM Type 2 Uncontrolled, presented with persistent bleeding associated with dizziness, fatigue and melena for several days. T  Assessment/Plan: Postoperative Hemorrhagic shock  -Patient is  been having bleeding from her gums since teeth removed. -8/24 transfuse 2 units PRBC -8/26 transfuse 1 unit PRBC  Upper GI bleed -Patient with multiple episodes of a hematemeses , Melena  -Continue Protonix to 40 mg BID - EGD notable for small gastric and duodenal ulcers and erosions  Chronic systolic CHF -Echocardiogram pending upon discharge; last echocardiogram 2014 showed severe CHF see below  -Coreg 25 mg BID - hold ACEI until renal function stabilizes   Pulmonary hypertension -See chronic systolic CHF  Mechanical mitral valve  -coumadin OK to restart upon discharge per GI ream   HLD, -Lipid panel pending upon discharge   DM Type 2 controlled -8/25 Hemoglobin A1c= 5.9   Code Status: FULL Family Communication: no family present at time of exam   Procedure/Significant Events: 03/19/2013 echocardiogram;- Left ventricle: mildly to moderately dilated. =-LVEF=25%. Diffuse hypokinesis. - Mitral valve: A mechanical prosthesis was present. T - Left atrium: moderately dilated. - Pulmonary arteries: PA peak pressure: 47mm Hg (S). 8/24 transfused 2 units PRBC 8/6 transfused 1 unit PRBC   Discharge Exam: Filed Vitals:   12/06/14 0800  BP: 114/71  Pulse: 75  Temp: 97.7 F (36.5 C)  Resp: 19   Filed Vitals:   12/06/14 0421 12/06/14 0439 12/06/14 0708 12/06/14 0800  BP:  98/59 111/61 114/71  Pulse: 74 73 70 75  Temp:  98.6 F (37 C) 98.2 F (36.8 C) 97.7 F (36.5 C)  TempSrc:  Oral Oral Oral  Resp: 19 19 22 19   Height:      Weight:      SpO2: 100% 99% 100% 100%  General: Pt is alert, follows commands appropriately, not in acute distress Cardiovascular: Regular rate and rhythm, no rubs, no gallops Respiratory: Clear to auscultation bilaterally, no wheezing, no crackles, no rhonchi Abdominal: Soft, non tender, non distended, bowel sounds +, no guarding Extremities: no edema, no cyanosis, pulses palpable bilaterally DP and PT Neuro: Grossly  nonfocal  Discharge Instructions  Discharge Instructions    Diet - low sodium heart healthy    Complete by:  As directed      Increase activity slowly    Complete by:  As directed             Medication List    STOP taking these medications        amoxicillin 500 MG tablet  Commonly known as:  AMOXIL     aspirin EC 81 MG tablet     Digoxin 62.5 MCG Tabs     quinapril 40 MG tablet  Commonly known as:  ACCUPRIL      TAKE these medications        atorvastatin 40 MG tablet  Commonly known as:  LIPITOR  Take 1 tablet (40 mg total) by mouth daily.     carvedilol 25 MG tablet  Commonly known as:  COREG  Take 1 tablet (25 mg total) by mouth 2 (two) times daily with a meal.     Choline Fenofibrate 135 MG capsule  Commonly known as:  TRILIPIX  Take 1 capsule (135 mg total) by mouth daily.     HYDROcodone-acetaminophen 5-500 MG per tablet  Commonly known as:  VICODIN  Take 1 tablet by mouth every 8 (eight) hours as needed for pain.     metFORMIN 1000 MG tablet  Commonly known as:  GLUCOPHAGE  Take 1,000 mg by mouth 2 (two) times daily with a meal.     ondansetron 4 MG tablet  Commonly known as:  ZOFRAN  Take 1 tablet (4 mg total) by mouth every 6 (six) hours as needed for nausea.     pantoprazole 40 MG tablet  Commonly known as:  PROTONIX  Take 1 tablet (40 mg total) by mouth 2 (two) times daily.     spironolactone 25 MG tablet  Commonly known as:  ALDACTONE  TAKE 1 TABLET EVERY DAY     warfarin 5 MG tablet  Commonly known as:  COUMADIN  Take 7.5 mg by mouth daily.            Follow-up Information    Follow up with [facility], [place].   Specialty:  Internal Medicine   Contact information:   [address] [place] [address] [phone]       Call Debbora Presto, MD.   Specialty:  Internal Medicine   Why:  As needed   Contact information:   [address] [place] [address] [phone]         The results of significant diagnostics from this hospitalization (including imaging, microbiology, ancillary and laboratory) are listed below for reference.     Microbiology: Recent Results (from the past 240 hour(s))  MRSA PCR Screening     Status: None   Collection Time: 12/04/14  2:23 AM  Result Value Ref Range Status   MRSA by PCR NEGATIVE NEGATIVE Final    Comment:        The GeneXpert MRSA Assay (FDA approved for NASAL specimens only), is one component of a comprehensive MRSA colonization surveillance program. It is not intended to diagnose MRSA infection  nor to guide or monitor treatment for MRSA infections.      Labs: Basic Metabolic Panel:  Recent Labs Lab 12/03/14 2236 12/04/14 0435 12/06/14 0042  NA 135 139 141  K 3.9 3.6 3.8  CL 101 105 114*  CO2 GLUCOSE 199* 110* 117*  BUN 55* 55* 23*  CREATININE 2.02* 1.81* 1.39*  CALCIUM 8.7* 8.5* 7.7*  MG  --  1.8 1.6*   Liver Function Tests:  Recent Labs Lab 12/03/14 2236 12/04/14 0435 12/06/14 0042  AST ALT ALKPHOS 41 36* 34*  BILITOT 0.4 0.6 0.3  PROT 5.8* 5.7* 4.8*  ALBUMIN 3.3* 2.9* 2.5*   No results for input(s): LIPASE, AMYLASE in the last 168 hours. No results for input(s): AMMONIA in the last 168 hours. CBC:  Recent Labs Lab 12/03/14 2236 12/04/14 0435 12/04/14 1020 12/05/14 0207 12/06/14 0042  WBC 10.0 9.6  --  9.2 8.1  HGB 5.9* 7.6* 8.8* 7.8* 7.8*  7.9*  HCT 17.7* 22.8* 26.0* 23.4* 23.4*  24.1*  MCV 93.7 90.8  --  91.4 92.0  PLT 253 202  --  220 217   Cardiac Enzymes:  Recent Labs Lab 12/03/14 2236 12/04/14 1212 12/04/14 1640 12/04/14 2322  TROPONINI <0.03 <0.03 <0.03 <0.03   BNP: BNP (last 3 results)  Recent Labs  12/04/14 1212  BNP 80.1    ProBNP (last 3 results) No results for input(s): PROBNP in the last 8760 hours.  CBG:  Recent Labs Lab 12/05/14 0439 12/05/14 0802 12/05/14 1215 12/05/14 1650 12/05/14 2205   GLUCAP 97 85 93 75 121*     SIGNED: Time coordinating discharge: 30 minutes  MAGICK-Absalom Aro, MD  [facility] 12/06/2014, 12:13 PM Pager [phone]  If 7PM-7AM, please contact night-coverage [email or web address] Password [email or web address]

## 2014-12-06 NOTE — Progress Notes (Signed)
ANTICOAGULATION CONSULT NOTE - Follow-up  Pharmacy Consult for Heparin/warfarin Indication: mechanical mitral valve  Allergies  Allergen Reactions  . Strawberry Rash    Rash & itching.    Patient Measurements: Height: 5\' 3"  (160 cm) Weight: 151 lb 0.2 oz (68.5 kg) IBW/kg (Calculated) : 52.4 Heparin Dosing Weight: 68 kg  Vital Signs: Temp: 97.7 F (36.5 C) (08/27 0800) Temp Source: Oral (08/27 0800) BP: 114/71 mmHg (08/27 0800) Pulse Rate: 75 (08/27 0800)  Labs:  Recent Labs  12/03/14 2236 12/04/14 0435 12/04/14 1020 12/04/14 1212 12/04/14 1640  12/04/14 2322 12/05/14 0207 12/06/14 0042 12/06/14 0957  HGB 5.9* 7.6* 8.8*  --   --   --   --  7.8* 7.8*  7.9*  --   HCT 17.7* 22.8* 26.0*  --   --   --   --  23.4* 23.4*  24.1*  --   PLT 253 202  --   --   --   --   --  220 217  --   LABPROT 23.3* 22.8*  --   --   --   --   --  19.3* 17.6*  --   INR 2.08* 2.03*  --   --   --   --   --  1.63* 1.44  --   HEPARINUNFRC  --   --   --   --   --   < >  --  0.54 0.74* 0.90*  CREATININE 2.02* 1.81*  --   --   --   --   --   --  1.39*  --   TROPONINI <0.03  --   --  <0.03 <0.03  --  <0.03  --   --   --   < > = values in this interval not displayed.  Estimated Creatinine Clearance: 34.5 mL/min (by C-G formula based on Cr of 1.39).  Assessment: 69yo female presents on 8/24 with melena and dizziness. On coumadin 7.5mg  daily PTA for mitral MV. Last dose was taken on 8/24. INR on admit was 2.08 but now 1.63. Started on heparin IV while coumadin was on hold. Coumadin resumed yesterday. Hepairn level remains slightly supratherpeutic. INR is low as expected. No further bleeding noted.   Goal of Therapy:  Heparin level 0.3-0.7 units/ml  Monitor platelets by anticoagulation protocol: Yes   Plan:  - Decrease heparin gtt to 850 units/hr - Check an 8 hour heparin level - Continue warfarin 7.5mg  PO daily - Daily heparin level, INR and CBC  Lysle Pearl, PharmD, BCPS Pager #  [phone] 12/06/2014 10:49 AM

## 2014-12-07 LAB — TYPE AND SCREEN
ABO/RH(D): O POS
Antibody Screen: NEGATIVE
UNIT DIVISION: 0
UNIT DIVISION: 0
UNIT DIVISION: 0
Unit division: 0

## 2014-12-08 ENCOUNTER — Ambulatory Visit (INDEPENDENT_AMBULATORY_CARE_PROVIDER_SITE_OTHER): Payer: Commercial Managed Care - HMO | Admitting: *Deleted

## 2014-12-08 DIAGNOSIS — Z5181 Encounter for therapeutic drug level monitoring: Secondary | ICD-10-CM

## 2014-12-08 DIAGNOSIS — Z954 Presence of other heart-valve replacement: Secondary | ICD-10-CM

## 2014-12-08 DIAGNOSIS — Z7901 Long term (current) use of anticoagulants: Secondary | ICD-10-CM

## 2014-12-08 DIAGNOSIS — Z952 Presence of prosthetic heart valve: Secondary | ICD-10-CM

## 2014-12-08 DIAGNOSIS — I059 Rheumatic mitral valve disease, unspecified: Secondary | ICD-10-CM

## 2014-12-08 LAB — POCT INR: INR: 1.2

## 2014-12-09 ENCOUNTER — Encounter (HOSPITAL_COMMUNITY): Payer: Self-pay | Admitting: Gastroenterology

## 2014-12-09 DIAGNOSIS — D508 Other iron deficiency anemias: Secondary | ICD-10-CM | POA: Diagnosis not present

## 2014-12-09 DIAGNOSIS — I10 Essential (primary) hypertension: Secondary | ICD-10-CM | POA: Diagnosis not present

## 2014-12-09 DIAGNOSIS — Z952 Presence of prosthetic heart valve: Secondary | ICD-10-CM | POA: Diagnosis not present

## 2014-12-16 ENCOUNTER — Ambulatory Visit (INDEPENDENT_AMBULATORY_CARE_PROVIDER_SITE_OTHER): Payer: Commercial Managed Care - HMO | Admitting: *Deleted

## 2014-12-16 DIAGNOSIS — Z954 Presence of other heart-valve replacement: Secondary | ICD-10-CM | POA: Diagnosis not present

## 2014-12-16 DIAGNOSIS — Z7901 Long term (current) use of anticoagulants: Secondary | ICD-10-CM

## 2014-12-16 DIAGNOSIS — I059 Rheumatic mitral valve disease, unspecified: Secondary | ICD-10-CM

## 2014-12-16 DIAGNOSIS — Z5181 Encounter for therapeutic drug level monitoring: Secondary | ICD-10-CM | POA: Diagnosis not present

## 2014-12-16 DIAGNOSIS — Z952 Presence of prosthetic heart valve: Secondary | ICD-10-CM

## 2014-12-16 LAB — POCT INR: INR: 2.2

## 2014-12-26 ENCOUNTER — Ambulatory Visit (INDEPENDENT_AMBULATORY_CARE_PROVIDER_SITE_OTHER): Payer: Commercial Managed Care - HMO | Admitting: Pharmacist

## 2014-12-26 DIAGNOSIS — Z7901 Long term (current) use of anticoagulants: Secondary | ICD-10-CM | POA: Diagnosis not present

## 2014-12-26 DIAGNOSIS — I059 Rheumatic mitral valve disease, unspecified: Secondary | ICD-10-CM

## 2014-12-26 DIAGNOSIS — Z5181 Encounter for therapeutic drug level monitoring: Secondary | ICD-10-CM | POA: Diagnosis not present

## 2014-12-26 DIAGNOSIS — Z952 Presence of prosthetic heart valve: Secondary | ICD-10-CM

## 2014-12-26 DIAGNOSIS — Z954 Presence of other heart-valve replacement: Secondary | ICD-10-CM | POA: Diagnosis not present

## 2014-12-26 LAB — POCT INR: INR: 2.6

## 2015-01-05 ENCOUNTER — Telehealth: Payer: Self-pay | Admitting: Cardiology

## 2015-01-05 ENCOUNTER — Ambulatory Visit (INDEPENDENT_AMBULATORY_CARE_PROVIDER_SITE_OTHER): Payer: Commercial Managed Care - HMO | Admitting: *Deleted

## 2015-01-05 ENCOUNTER — Encounter: Payer: Self-pay | Admitting: Internal Medicine

## 2015-01-05 DIAGNOSIS — I428 Other cardiomyopathies: Secondary | ICD-10-CM | POA: Diagnosis not present

## 2015-01-05 NOTE — Telephone Encounter (Signed)
Attempted to confirm remote transmission with pt. No answer and was unable to leave a message.   

## 2015-01-06 LAB — CUP PACEART REMOTE DEVICE CHECK
Brady Statistic RV Percent Paced: 1 %
Date Time Interrogation Session: 20160926165221
HighPow Impedance: 43 Ohm
Lead Channel Pacing Threshold Amplitude: 0.75 V
Lead Channel Setting Pacing Amplitude: 2.5 V
MDC IDC MSMT BATTERY REMAINING LONGEVITY: 40 mo
MDC IDC MSMT BATTERY VOLTAGE: 2.59 V
MDC IDC MSMT LEADCHNL RV IMPEDANCE VALUE: 410 Ohm
MDC IDC MSMT LEADCHNL RV PACING THRESHOLD PULSEWIDTH: 0.5 ms
MDC IDC MSMT LEADCHNL RV SENSING INTR AMPL: 9.7 mV
MDC IDC SET LEADCHNL RV PACING PULSEWIDTH: 0.5 ms
MDC IDC SET LEADCHNL RV SENSING SENSITIVITY: 0.3 mV
MDC IDC SET ZONE DETECTION INTERVAL: 285 ms
MDC IDC SET ZONE DETECTION INTERVAL: 350 ms
Pulse Gen Serial Number: 543269
Zone Setting Detection Interval: 250 ms

## 2015-01-06 NOTE — Progress Notes (Signed)
Remote ICD transmission.   

## 2015-01-07 ENCOUNTER — Telehealth: Payer: Self-pay | Admitting: *Deleted

## 2015-01-07 NOTE — Telephone Encounter (Signed)
Spoke with pt and she states has been itching for the past week to the point has sores  and states she wants to know if she can take Bendaryl with her coumadin. This nurse informs her that there is no interaction between coumadin and benadryl but she needs to call her PCP regarding this as she states this has been going on for a week with no new medication or changes in any items that she uses Pt states understanding of the above instructions

## 2015-01-15 ENCOUNTER — Ambulatory Visit (INDEPENDENT_AMBULATORY_CARE_PROVIDER_SITE_OTHER): Payer: Commercial Managed Care - HMO | Admitting: *Deleted

## 2015-01-15 DIAGNOSIS — Z7901 Long term (current) use of anticoagulants: Secondary | ICD-10-CM | POA: Diagnosis not present

## 2015-01-15 DIAGNOSIS — Z952 Presence of prosthetic heart valve: Secondary | ICD-10-CM

## 2015-01-15 DIAGNOSIS — Z954 Presence of other heart-valve replacement: Secondary | ICD-10-CM | POA: Diagnosis not present

## 2015-01-15 DIAGNOSIS — I059 Rheumatic mitral valve disease, unspecified: Secondary | ICD-10-CM

## 2015-01-15 DIAGNOSIS — Z5181 Encounter for therapeutic drug level monitoring: Secondary | ICD-10-CM | POA: Diagnosis not present

## 2015-01-15 LAB — POCT INR: INR: 2.6

## 2015-01-20 ENCOUNTER — Encounter: Payer: Self-pay | Admitting: Cardiology

## 2015-01-26 DIAGNOSIS — F1190 Opioid use, unspecified, uncomplicated: Secondary | ICD-10-CM | POA: Diagnosis not present

## 2015-01-26 DIAGNOSIS — E782 Mixed hyperlipidemia: Secondary | ICD-10-CM | POA: Diagnosis not present

## 2015-01-26 DIAGNOSIS — I10 Essential (primary) hypertension: Secondary | ICD-10-CM | POA: Diagnosis not present

## 2015-01-26 DIAGNOSIS — E1165 Type 2 diabetes mellitus with hyperglycemia: Secondary | ICD-10-CM | POA: Diagnosis not present

## 2015-02-12 ENCOUNTER — Ambulatory Visit (INDEPENDENT_AMBULATORY_CARE_PROVIDER_SITE_OTHER): Payer: Commercial Managed Care - HMO

## 2015-02-12 DIAGNOSIS — Z954 Presence of other heart-valve replacement: Secondary | ICD-10-CM | POA: Diagnosis not present

## 2015-02-12 DIAGNOSIS — Z952 Presence of prosthetic heart valve: Secondary | ICD-10-CM

## 2015-02-12 DIAGNOSIS — Z5181 Encounter for therapeutic drug level monitoring: Secondary | ICD-10-CM | POA: Diagnosis not present

## 2015-02-12 DIAGNOSIS — Z7901 Long term (current) use of anticoagulants: Secondary | ICD-10-CM | POA: Diagnosis not present

## 2015-02-12 DIAGNOSIS — I059 Rheumatic mitral valve disease, unspecified: Secondary | ICD-10-CM | POA: Diagnosis not present

## 2015-02-12 LAB — POCT INR: INR: 2.4

## 2015-03-12 ENCOUNTER — Ambulatory Visit (INDEPENDENT_AMBULATORY_CARE_PROVIDER_SITE_OTHER): Payer: Commercial Managed Care - HMO | Admitting: *Deleted

## 2015-03-12 DIAGNOSIS — I059 Rheumatic mitral valve disease, unspecified: Secondary | ICD-10-CM

## 2015-03-12 DIAGNOSIS — Z954 Presence of other heart-valve replacement: Secondary | ICD-10-CM | POA: Diagnosis not present

## 2015-03-12 DIAGNOSIS — Z7901 Long term (current) use of anticoagulants: Secondary | ICD-10-CM

## 2015-03-12 DIAGNOSIS — Z5181 Encounter for therapeutic drug level monitoring: Secondary | ICD-10-CM | POA: Diagnosis not present

## 2015-03-12 DIAGNOSIS — Z952 Presence of prosthetic heart valve: Secondary | ICD-10-CM

## 2015-03-12 LAB — POCT INR: INR: 2.8

## 2015-03-17 DIAGNOSIS — I10 Essential (primary) hypertension: Secondary | ICD-10-CM | POA: Diagnosis not present

## 2015-03-17 DIAGNOSIS — E1165 Type 2 diabetes mellitus with hyperglycemia: Secondary | ICD-10-CM | POA: Diagnosis not present

## 2015-03-17 DIAGNOSIS — E782 Mixed hyperlipidemia: Secondary | ICD-10-CM | POA: Diagnosis not present

## 2015-03-27 DIAGNOSIS — I429 Cardiomyopathy, unspecified: Secondary | ICD-10-CM | POA: Diagnosis not present

## 2015-03-27 DIAGNOSIS — F1190 Opioid use, unspecified, uncomplicated: Secondary | ICD-10-CM | POA: Diagnosis not present

## 2015-03-27 DIAGNOSIS — E782 Mixed hyperlipidemia: Secondary | ICD-10-CM | POA: Diagnosis not present

## 2015-03-27 DIAGNOSIS — I10 Essential (primary) hypertension: Secondary | ICD-10-CM | POA: Diagnosis not present

## 2015-03-27 DIAGNOSIS — I2510 Atherosclerotic heart disease of native coronary artery without angina pectoris: Secondary | ICD-10-CM | POA: Diagnosis not present

## 2015-03-27 DIAGNOSIS — E875 Hyperkalemia: Secondary | ICD-10-CM | POA: Diagnosis not present

## 2015-04-07 ENCOUNTER — Ambulatory Visit (INDEPENDENT_AMBULATORY_CARE_PROVIDER_SITE_OTHER): Payer: Commercial Managed Care - HMO | Admitting: *Deleted

## 2015-04-07 DIAGNOSIS — I428 Other cardiomyopathies: Secondary | ICD-10-CM

## 2015-04-07 NOTE — Progress Notes (Signed)
Remote ICD transmission.   

## 2015-04-09 ENCOUNTER — Ambulatory Visit (INDEPENDENT_AMBULATORY_CARE_PROVIDER_SITE_OTHER): Payer: Commercial Managed Care - HMO | Admitting: *Deleted

## 2015-04-09 DIAGNOSIS — Z7901 Long term (current) use of anticoagulants: Secondary | ICD-10-CM

## 2015-04-09 DIAGNOSIS — Z954 Presence of other heart-valve replacement: Secondary | ICD-10-CM | POA: Diagnosis not present

## 2015-04-09 DIAGNOSIS — Z5181 Encounter for therapeutic drug level monitoring: Secondary | ICD-10-CM | POA: Diagnosis not present

## 2015-04-09 DIAGNOSIS — I059 Rheumatic mitral valve disease, unspecified: Secondary | ICD-10-CM | POA: Diagnosis not present

## 2015-04-09 DIAGNOSIS — Z952 Presence of prosthetic heart valve: Secondary | ICD-10-CM

## 2015-04-09 LAB — POCT INR: INR: 2.3

## 2015-04-10 ENCOUNTER — Encounter: Payer: Self-pay | Admitting: Cardiology

## 2015-04-10 LAB — CUP PACEART REMOTE DEVICE CHECK
Battery Remaining Longevity: 40 mo
Battery Voltage: 2.59 V
Brady Statistic RV Percent Paced: 1 %
HIGH POWER IMPEDANCE MEASURED VALUE: 38 Ohm
Implantable Lead Implant Date: 20090410
Implantable Lead Location: 753860
Implantable Lead Model: 7120
Lead Channel Sensing Intrinsic Amplitude: 8.3 mV
Lead Channel Setting Pacing Amplitude: 2.5 V
Lead Channel Setting Pacing Pulse Width: 0.5 ms
Lead Channel Setting Sensing Sensitivity: 0.3 mV
MDC IDC MSMT LEADCHNL RV IMPEDANCE VALUE: 380 Ohm
MDC IDC PG SERIAL: 543269
MDC IDC SESS DTM: 20161227071954

## 2015-04-17 ENCOUNTER — Telehealth: Payer: Self-pay | Admitting: Internal Medicine

## 2015-04-17 DIAGNOSIS — I10 Essential (primary) hypertension: Secondary | ICD-10-CM | POA: Diagnosis not present

## 2015-04-17 NOTE — Telephone Encounter (Signed)
New Message  Pt stated she is returning Rn phone call from a couple days ago. Please call back and discuss.

## 2015-04-17 NOTE — Telephone Encounter (Signed)
Sherri,  Is this the digoxin patient you were holding on to?

## 2015-04-17 NOTE — Telephone Encounter (Signed)
Attempted to reach patient -- no answer. Will try to reach her Monday 1/9

## 2015-04-20 MED ORDER — DIGOXIN 62.5 MCG PO TABS: 0.0625 mg | ORAL_TABLET | Freq: Every day | ORAL | Status: DC

## 2015-04-20 NOTE — Telephone Encounter (Signed)
Informed patient that Dr. Graciela Husbands recommends re-starting Digoxin 0.0625 mg daily. Patient is agreeable but needs new rx sent to pharmacy. Rx sent to [facility]. Follow lab work scheduled for 1/27. Patient verbalized understanding and agreeable to plan.

## 2015-04-30 ENCOUNTER — Ambulatory Visit (INDEPENDENT_AMBULATORY_CARE_PROVIDER_SITE_OTHER): Payer: Commercial Managed Care - HMO | Admitting: *Deleted

## 2015-04-30 ENCOUNTER — Other Ambulatory Visit: Payer: Self-pay | Admitting: Internal Medicine

## 2015-04-30 DIAGNOSIS — Z954 Presence of other heart-valve replacement: Secondary | ICD-10-CM | POA: Diagnosis not present

## 2015-04-30 DIAGNOSIS — I059 Rheumatic mitral valve disease, unspecified: Secondary | ICD-10-CM

## 2015-04-30 DIAGNOSIS — Z7901 Long term (current) use of anticoagulants: Secondary | ICD-10-CM

## 2015-04-30 DIAGNOSIS — Z5181 Encounter for therapeutic drug level monitoring: Secondary | ICD-10-CM | POA: Diagnosis not present

## 2015-04-30 DIAGNOSIS — Z952 Presence of prosthetic heart valve: Secondary | ICD-10-CM

## 2015-04-30 LAB — POCT INR: INR: 2.9

## 2015-04-30 MED ORDER — WARFARIN SODIUM 5 MG PO TABS: ORAL_TABLET | ORAL | Status: DC

## 2015-04-30 NOTE — Telephone Encounter (Signed)
New message      *STAT* If patient is at the pharmacy, call can be transferred to refill team.   1. Which medications need to be refilled? (please list name of each medication and dose if known) warfarin 5 mg   2. Which pharmacy/location (including street and city if local pharmacy) is medication to be sent to? [facility] [address] [address]  [phone]   3. Do they need a 30 day or 90 day supply? 30 days supply

## 2015-04-30 NOTE — Telephone Encounter (Signed)
Refill done as requested 

## 2015-05-08 ENCOUNTER — Other Ambulatory Visit (INDEPENDENT_AMBULATORY_CARE_PROVIDER_SITE_OTHER): Payer: Commercial Managed Care - HMO | Admitting: *Deleted

## 2015-05-08 DIAGNOSIS — I348 Other nonrheumatic mitral valve disorders: Secondary | ICD-10-CM | POA: Diagnosis not present

## 2015-05-08 DIAGNOSIS — I359 Nonrheumatic aortic valve disorder, unspecified: Secondary | ICD-10-CM | POA: Diagnosis not present

## 2015-05-08 DIAGNOSIS — I10 Essential (primary) hypertension: Secondary | ICD-10-CM | POA: Diagnosis not present

## 2015-05-08 DIAGNOSIS — I5022 Chronic systolic (congestive) heart failure: Secondary | ICD-10-CM

## 2015-05-08 DIAGNOSIS — I428 Other cardiomyopathies: Secondary | ICD-10-CM

## 2015-05-08 DIAGNOSIS — I509 Heart failure, unspecified: Secondary | ICD-10-CM | POA: Diagnosis not present

## 2015-05-08 DIAGNOSIS — I059 Rheumatic mitral valve disease, unspecified: Secondary | ICD-10-CM

## 2015-05-08 NOTE — Addendum Note (Signed)
Addended by: Tonita Phoenix on: 05/08/2015 08:43 AM   Modules accepted: Orders

## 2015-05-08 NOTE — Addendum Note (Signed)
Addended by: Tonita Phoenix on: 05/08/2015 11:00 AM   Modules accepted: Orders

## 2015-05-08 NOTE — Addendum Note (Signed)
Addended by: Tonita Phoenix on: 05/08/2015 08:42 AM   Modules accepted: Orders

## 2015-05-09 LAB — DIGOXIN LEVEL: DIGOXIN LVL: 0.6 ug/L — AB (ref 0.8–2.0)

## 2015-05-12 ENCOUNTER — Telehealth: Payer: Self-pay | Admitting: Internal Medicine

## 2015-05-12 DIAGNOSIS — I428 Other cardiomyopathies: Secondary | ICD-10-CM

## 2015-05-12 DIAGNOSIS — I5022 Chronic systolic (congestive) heart failure: Secondary | ICD-10-CM

## 2015-05-12 NOTE — Telephone Encounter (Signed)
Please call,concerning her medicine. °

## 2015-05-12 NOTE — Telephone Encounter (Signed)
I left a message for the patient to call. 

## 2015-05-13 MED ORDER — CARVEDILOL 25 MG PO TABS: 25.0000 mg | ORAL_TABLET | Freq: Two times a day (BID) | ORAL | Status: DC

## 2015-05-13 MED ORDER — DIGOXIN 62.5 MCG PO TABS: 0.0625 mg | ORAL_TABLET | Freq: Every day | ORAL | Status: DC

## 2015-05-13 NOTE — Telephone Encounter (Signed)
Please repeat B met. We will anticipate resuming quinapril at 10 mg a day with repeat metabolic profile at 1 and 3 weeks after its initiation

## 2015-05-13 NOTE — Telephone Encounter (Signed)
I spoke with the patient. She is needing her digoxin 0.0625 mg once daily, coreg 25 mg BID, and quinipril 40 mg once daily sent to [facility] pharmacy. She just had a digoxin level repeated and this was ok. I advised the patient I will refill her medications for her., however, after I hung up with the patient, I realized that quinipril is off her medication list. In reviewing her chart further, she was taking quinipril in June when she last saw Dr. Graciela Husbands. She was admitted in 11/2014 with a bleeding and low hemaglobin following a dental procedure. At discharge, it was noted that her quinipril had been stopped due to her renal function. Her most recent creatinine levels are: 10/03/14- 1.03 12/03/14- 2.02 12/04/14- 1.81  Will review with Dr. Graciela Husbands prior to refilling her quinipril. She just had labs on 05/08/15 to recheck her digoxin level, but no BMP was drawn at the time.

## 2015-05-13 NOTE — Telephone Encounter (Signed)
Returning your call from yesterday. °

## 2015-05-14 MED ORDER — CARVEDILOL 25 MG PO TABS: 25.0000 mg | ORAL_TABLET | Freq: Two times a day (BID) | ORAL | Status: DC

## 2015-05-14 NOTE — Telephone Encounter (Signed)
[facility] left a voicemail on the refill line requesting a refill on carvedilol 25mg  and quinipril 20mg . I will resend the carvedilol as it looks like the rx was on print when it was ordered yesterday. Please advise on the quinipril. Thanks, MI

## 2015-05-18 NOTE — Telephone Encounter (Signed)
[facility] following up on quinipril refill request.

## 2015-05-21 ENCOUNTER — Telehealth: Payer: Self-pay | Admitting: Internal Medicine

## 2015-05-21 NOTE — Telephone Encounter (Signed)
See 1/31 office note.

## 2015-05-21 NOTE — Telephone Encounter (Signed)
I attempted to call the patient- per Dr. Graciela Husbands, she needs to come in for a BMP prior to re-starting quinipril. No answer x multiple rings.

## 2015-05-21 NOTE — Telephone Encounter (Signed)
°*  STAT* If patient is at the pharmacy, call can be transferred to refill team.   1. Which medications need to be refilled? (please list name of each medication and dose if known) quinapril 20mg    2. Which pharmacy/location (including street and city if local pharmacy) is medication to be sent to?[facility]   3. Do they need a 30 day or 90 day supply? 90

## 2015-05-21 NOTE — Telephone Encounter (Signed)
Looks like quinipril was stopped at hospital discharge 11/2014. Just wanted to clarify as patient is requesting a refill. Please advise. Thanks, MI

## 2015-05-22 DIAGNOSIS — I2510 Atherosclerotic heart disease of native coronary artery without angina pectoris: Secondary | ICD-10-CM | POA: Diagnosis not present

## 2015-05-22 DIAGNOSIS — I10 Essential (primary) hypertension: Secondary | ICD-10-CM | POA: Diagnosis not present

## 2015-05-28 ENCOUNTER — Ambulatory Visit (INDEPENDENT_AMBULATORY_CARE_PROVIDER_SITE_OTHER): Payer: Commercial Managed Care - HMO | Admitting: *Deleted

## 2015-05-28 DIAGNOSIS — Z954 Presence of other heart-valve replacement: Secondary | ICD-10-CM

## 2015-05-28 DIAGNOSIS — Z7901 Long term (current) use of anticoagulants: Secondary | ICD-10-CM

## 2015-05-28 DIAGNOSIS — Z5181 Encounter for therapeutic drug level monitoring: Secondary | ICD-10-CM

## 2015-05-28 DIAGNOSIS — I059 Rheumatic mitral valve disease, unspecified: Secondary | ICD-10-CM | POA: Diagnosis not present

## 2015-05-28 DIAGNOSIS — Z952 Presence of prosthetic heart valve: Secondary | ICD-10-CM

## 2015-05-28 LAB — POCT INR: INR: 2.3

## 2015-05-29 DIAGNOSIS — F1190 Opioid use, unspecified, uncomplicated: Secondary | ICD-10-CM | POA: Diagnosis not present

## 2015-05-29 DIAGNOSIS — R21 Rash and other nonspecific skin eruption: Secondary | ICD-10-CM | POA: Diagnosis not present

## 2015-05-29 DIAGNOSIS — M150 Primary generalized (osteo)arthritis: Secondary | ICD-10-CM | POA: Diagnosis not present

## 2015-05-29 DIAGNOSIS — E1165 Type 2 diabetes mellitus with hyperglycemia: Secondary | ICD-10-CM | POA: Diagnosis not present

## 2015-06-01 ENCOUNTER — Telehealth: Payer: Self-pay | Admitting: Internal Medicine

## 2015-06-01 NOTE — Telephone Encounter (Signed)
New message   *STAT* If patient is at the pharmacy, call can be transferred to refill team.   1. Which medications need to be refilled? (please list name of each medication and dose if known) Quinapril   2. Which pharmacy/location (including street and city if local pharmacy) is medication to be sent to?  [facility] [address]  3. Do they need a 30 day or 90 day supply? 90 day supply

## 2015-06-01 NOTE — Telephone Encounter (Signed)
See other phone note:left message for patient to call the office.

## 2015-06-01 NOTE — Telephone Encounter (Signed)
See 05/12/15 phone note.

## 2015-06-01 NOTE — Telephone Encounter (Signed)
Follow up     Please send refill to [facility] pharmacy,  See message below

## 2015-06-01 NOTE — Telephone Encounter (Signed)
Patient will come in for lab appointment tomorrow.

## 2015-06-02 ENCOUNTER — Other Ambulatory Visit (INDEPENDENT_AMBULATORY_CARE_PROVIDER_SITE_OTHER): Payer: Commercial Managed Care - HMO | Admitting: *Deleted

## 2015-06-02 DIAGNOSIS — I5022 Chronic systolic (congestive) heart failure: Secondary | ICD-10-CM

## 2015-06-02 DIAGNOSIS — I429 Cardiomyopathy, unspecified: Secondary | ICD-10-CM | POA: Diagnosis not present

## 2015-06-02 DIAGNOSIS — I428 Other cardiomyopathies: Secondary | ICD-10-CM | POA: Diagnosis not present

## 2015-06-02 LAB — BASIC METABOLIC PANEL
BUN: 23 mg/dL (ref 7–25)
CO2: 24 mmol/L (ref 20–31)
Calcium: 8.8 mg/dL (ref 8.6–10.4)
Chloride: 108 mmol/L (ref 98–110)
Creat: 1.05 mg/dL — ABNORMAL HIGH (ref 0.60–0.93)
GLUCOSE: 103 mg/dL — AB (ref 65–99)
POTASSIUM: 4.5 mmol/L (ref 3.5–5.3)
SODIUM: 141 mmol/L (ref 135–146)

## 2015-06-08 ENCOUNTER — Other Ambulatory Visit (HOSPITAL_COMMUNITY): Payer: Self-pay | Admitting: *Deleted

## 2015-06-08 MED ORDER — QUINAPRIL HCL 20 MG PO TABS: 20.0000 mg | ORAL_TABLET | Freq: Every day | ORAL | Status: DC

## 2015-06-08 MED ORDER — WARFARIN SODIUM 5 MG PO TABS: ORAL_TABLET | ORAL | Status: DC

## 2015-06-08 NOTE — Telephone Encounter (Signed)
Labs from 06/02/15 were reviewed with Dr. Graciela Husbands last Thursday. BMP normal- ok to restart quinapril at 10 mg once daily.  I called the patient to notify her of this today and she states she was already back taking quinapril 20 mg once daily. She was taking this when she had her lab done. I advised her to continue quinapril 20 mg once daily. I will send in an updated RX to [facility] per her request.

## 2015-06-10 ENCOUNTER — Other Ambulatory Visit (HOSPITAL_COMMUNITY): Payer: Self-pay | Admitting: *Deleted

## 2015-06-10 MED ORDER — WARFARIN SODIUM 5 MG PO TABS: ORAL_TABLET | ORAL | Status: DC

## 2015-06-18 ENCOUNTER — Ambulatory Visit (INDEPENDENT_AMBULATORY_CARE_PROVIDER_SITE_OTHER): Payer: Commercial Managed Care - HMO | Admitting: *Deleted

## 2015-06-18 DIAGNOSIS — Z7901 Long term (current) use of anticoagulants: Secondary | ICD-10-CM | POA: Diagnosis not present

## 2015-06-18 DIAGNOSIS — Z5181 Encounter for therapeutic drug level monitoring: Secondary | ICD-10-CM

## 2015-06-18 DIAGNOSIS — Z954 Presence of other heart-valve replacement: Secondary | ICD-10-CM | POA: Diagnosis not present

## 2015-06-18 DIAGNOSIS — I059 Rheumatic mitral valve disease, unspecified: Secondary | ICD-10-CM | POA: Diagnosis not present

## 2015-06-18 DIAGNOSIS — Z952 Presence of prosthetic heart valve: Secondary | ICD-10-CM

## 2015-06-18 LAB — POCT INR: INR: 3

## 2015-07-07 ENCOUNTER — Ambulatory Visit (INDEPENDENT_AMBULATORY_CARE_PROVIDER_SITE_OTHER): Payer: Commercial Managed Care - HMO | Admitting: *Deleted

## 2015-07-07 ENCOUNTER — Telehealth: Payer: Self-pay | Admitting: Cardiology

## 2015-07-07 DIAGNOSIS — I428 Other cardiomyopathies: Secondary | ICD-10-CM

## 2015-07-07 DIAGNOSIS — I429 Cardiomyopathy, unspecified: Secondary | ICD-10-CM | POA: Diagnosis not present

## 2015-07-07 NOTE — Progress Notes (Signed)
Remote ICD transmission.   

## 2015-07-07 NOTE — Telephone Encounter (Signed)
Spoke with pt and reminded pt of remote transmission that is due today. Pt verbalized understanding.   

## 2015-07-16 DIAGNOSIS — E782 Mixed hyperlipidemia: Secondary | ICD-10-CM | POA: Diagnosis not present

## 2015-07-16 DIAGNOSIS — M150 Primary generalized (osteo)arthritis: Secondary | ICD-10-CM | POA: Diagnosis not present

## 2015-07-16 DIAGNOSIS — F1190 Opioid use, unspecified, uncomplicated: Secondary | ICD-10-CM | POA: Diagnosis not present

## 2015-07-16 DIAGNOSIS — E1165 Type 2 diabetes mellitus with hyperglycemia: Secondary | ICD-10-CM | POA: Diagnosis not present

## 2015-07-17 ENCOUNTER — Ambulatory Visit (INDEPENDENT_AMBULATORY_CARE_PROVIDER_SITE_OTHER): Payer: Commercial Managed Care - HMO | Admitting: Pharmacist

## 2015-07-17 DIAGNOSIS — Z954 Presence of other heart-valve replacement: Secondary | ICD-10-CM

## 2015-07-17 DIAGNOSIS — I059 Rheumatic mitral valve disease, unspecified: Secondary | ICD-10-CM

## 2015-07-17 DIAGNOSIS — Z5181 Encounter for therapeutic drug level monitoring: Secondary | ICD-10-CM | POA: Diagnosis not present

## 2015-07-17 DIAGNOSIS — Z7901 Long term (current) use of anticoagulants: Secondary | ICD-10-CM | POA: Diagnosis not present

## 2015-07-17 DIAGNOSIS — Z952 Presence of prosthetic heart valve: Secondary | ICD-10-CM

## 2015-07-17 LAB — POCT INR: INR: 2.2

## 2015-07-23 DIAGNOSIS — E1165 Type 2 diabetes mellitus with hyperglycemia: Secondary | ICD-10-CM | POA: Diagnosis not present

## 2015-07-23 DIAGNOSIS — M150 Primary generalized (osteo)arthritis: Secondary | ICD-10-CM | POA: Diagnosis not present

## 2015-07-23 DIAGNOSIS — E782 Mixed hyperlipidemia: Secondary | ICD-10-CM | POA: Diagnosis not present

## 2015-07-23 DIAGNOSIS — F1190 Opioid use, unspecified, uncomplicated: Secondary | ICD-10-CM | POA: Diagnosis not present

## 2015-08-05 LAB — CUP PACEART REMOTE DEVICE CHECK
Brady Statistic RV Percent Paced: 1 %
Date Time Interrogation Session: 20170328142737
HighPow Impedance: 40 Ohm
Implantable Lead Implant Date: 20090410
Implantable Lead Location: 753860
Implantable Lead Model: 7120
Lead Channel Pacing Threshold Amplitude: 0.75 V
Lead Channel Pacing Threshold Pulse Width: 0.5 ms
Lead Channel Setting Sensing Sensitivity: 0.3 mV
MDC IDC MSMT BATTERY REMAINING LONGEVITY: 37 mo
MDC IDC MSMT BATTERY VOLTAGE: 2.57 V
MDC IDC MSMT LEADCHNL RV IMPEDANCE VALUE: 380 Ohm
MDC IDC MSMT LEADCHNL RV SENSING INTR AMPL: 8.7 mV
MDC IDC SET LEADCHNL RV PACING AMPLITUDE: 2.5 V
MDC IDC SET LEADCHNL RV PACING PULSEWIDTH: 0.5 ms
Pulse Gen Serial Number: 543269

## 2015-08-07 ENCOUNTER — Encounter: Payer: Self-pay | Admitting: Cardiology

## 2015-08-14 ENCOUNTER — Ambulatory Visit (INDEPENDENT_AMBULATORY_CARE_PROVIDER_SITE_OTHER): Payer: Commercial Managed Care - HMO

## 2015-08-14 DIAGNOSIS — Z952 Presence of prosthetic heart valve: Secondary | ICD-10-CM

## 2015-08-14 DIAGNOSIS — I059 Rheumatic mitral valve disease, unspecified: Secondary | ICD-10-CM

## 2015-08-14 DIAGNOSIS — Z5181 Encounter for therapeutic drug level monitoring: Secondary | ICD-10-CM

## 2015-08-14 DIAGNOSIS — Z7901 Long term (current) use of anticoagulants: Secondary | ICD-10-CM | POA: Diagnosis not present

## 2015-08-14 DIAGNOSIS — Z954 Presence of other heart-valve replacement: Secondary | ICD-10-CM

## 2015-08-14 LAB — POCT INR: INR: 2.7

## 2015-08-17 DIAGNOSIS — J00 Acute nasopharyngitis [common cold]: Secondary | ICD-10-CM | POA: Diagnosis not present

## 2015-08-17 DIAGNOSIS — J069 Acute upper respiratory infection, unspecified: Secondary | ICD-10-CM | POA: Diagnosis not present

## 2015-08-18 ENCOUNTER — Telehealth: Payer: Self-pay | Admitting: *Deleted

## 2015-08-18 NOTE — Telephone Encounter (Signed)
Pt called to inform us that she is on Bactrim DS twice a day for 7 days.  She started medication on 08/18/15. Advised that the medication will interact with Coumadin & we need to check her INR. Appt made over the phone.

## 2015-08-21 ENCOUNTER — Ambulatory Visit (INDEPENDENT_AMBULATORY_CARE_PROVIDER_SITE_OTHER): Payer: Commercial Managed Care - HMO | Admitting: Surgery

## 2015-08-21 DIAGNOSIS — Z5181 Encounter for therapeutic drug level monitoring: Secondary | ICD-10-CM | POA: Diagnosis not present

## 2015-08-21 DIAGNOSIS — Z954 Presence of other heart-valve replacement: Secondary | ICD-10-CM | POA: Diagnosis not present

## 2015-08-21 DIAGNOSIS — Z7901 Long term (current) use of anticoagulants: Secondary | ICD-10-CM | POA: Diagnosis not present

## 2015-08-21 DIAGNOSIS — Z952 Presence of prosthetic heart valve: Secondary | ICD-10-CM

## 2015-08-21 DIAGNOSIS — I059 Rheumatic mitral valve disease, unspecified: Secondary | ICD-10-CM

## 2015-08-21 LAB — POCT INR: INR: 3.1

## 2015-09-11 ENCOUNTER — Ambulatory Visit (INDEPENDENT_AMBULATORY_CARE_PROVIDER_SITE_OTHER): Payer: Commercial Managed Care - HMO | Admitting: *Deleted

## 2015-09-11 DIAGNOSIS — Z954 Presence of other heart-valve replacement: Secondary | ICD-10-CM | POA: Diagnosis not present

## 2015-09-11 DIAGNOSIS — Z952 Presence of prosthetic heart valve: Secondary | ICD-10-CM

## 2015-09-11 DIAGNOSIS — I059 Rheumatic mitral valve disease, unspecified: Secondary | ICD-10-CM

## 2015-09-11 DIAGNOSIS — Z5181 Encounter for therapeutic drug level monitoring: Secondary | ICD-10-CM | POA: Diagnosis not present

## 2015-09-11 DIAGNOSIS — Z7901 Long term (current) use of anticoagulants: Secondary | ICD-10-CM

## 2015-09-11 LAB — POCT INR: INR: 2.6

## 2015-09-22 DIAGNOSIS — M25562 Pain in left knee: Secondary | ICD-10-CM | POA: Diagnosis not present

## 2015-09-22 DIAGNOSIS — F1190 Opioid use, unspecified, uncomplicated: Secondary | ICD-10-CM | POA: Diagnosis not present

## 2015-09-22 DIAGNOSIS — E1165 Type 2 diabetes mellitus with hyperglycemia: Secondary | ICD-10-CM | POA: Diagnosis not present

## 2015-09-22 DIAGNOSIS — I10 Essential (primary) hypertension: Secondary | ICD-10-CM | POA: Diagnosis not present

## 2015-09-23 ENCOUNTER — Telehealth: Payer: Self-pay | Admitting: Internal Medicine

## 2015-09-25 ENCOUNTER — Ambulatory Visit (INDEPENDENT_AMBULATORY_CARE_PROVIDER_SITE_OTHER): Payer: Commercial Managed Care - HMO | Admitting: *Deleted

## 2015-09-25 DIAGNOSIS — Z5181 Encounter for therapeutic drug level monitoring: Secondary | ICD-10-CM | POA: Diagnosis not present

## 2015-09-25 DIAGNOSIS — Z7901 Long term (current) use of anticoagulants: Secondary | ICD-10-CM | POA: Diagnosis not present

## 2015-09-25 DIAGNOSIS — Z952 Presence of prosthetic heart valve: Secondary | ICD-10-CM

## 2015-09-25 DIAGNOSIS — Z954 Presence of other heart-valve replacement: Secondary | ICD-10-CM

## 2015-09-25 DIAGNOSIS — I059 Rheumatic mitral valve disease, unspecified: Secondary | ICD-10-CM

## 2015-09-25 LAB — POCT INR: INR: 3.8

## 2015-10-01 ENCOUNTER — Ambulatory Visit (INDEPENDENT_AMBULATORY_CARE_PROVIDER_SITE_OTHER): Payer: Commercial Managed Care - HMO | Admitting: *Deleted

## 2015-10-01 DIAGNOSIS — Z5181 Encounter for therapeutic drug level monitoring: Secondary | ICD-10-CM | POA: Diagnosis not present

## 2015-10-01 DIAGNOSIS — I059 Rheumatic mitral valve disease, unspecified: Secondary | ICD-10-CM

## 2015-10-01 LAB — POCT INR: INR: 2.2

## 2015-10-16 ENCOUNTER — Ambulatory Visit (INDEPENDENT_AMBULATORY_CARE_PROVIDER_SITE_OTHER): Payer: Commercial Managed Care - HMO | Admitting: *Deleted

## 2015-10-16 DIAGNOSIS — Z5181 Encounter for therapeutic drug level monitoring: Secondary | ICD-10-CM | POA: Diagnosis not present

## 2015-10-16 DIAGNOSIS — I059 Rheumatic mitral valve disease, unspecified: Secondary | ICD-10-CM | POA: Diagnosis not present

## 2015-10-16 LAB — POCT INR: INR: 2.9

## 2015-10-22 ENCOUNTER — Encounter: Payer: Self-pay | Admitting: Internal Medicine

## 2015-10-22 ENCOUNTER — Ambulatory Visit (INDEPENDENT_AMBULATORY_CARE_PROVIDER_SITE_OTHER): Payer: Commercial Managed Care - HMO | Admitting: Internal Medicine

## 2015-10-22 ENCOUNTER — Ambulatory Visit (INDEPENDENT_AMBULATORY_CARE_PROVIDER_SITE_OTHER): Payer: Commercial Managed Care - HMO | Admitting: *Deleted

## 2015-10-22 VITALS — BP 122/78 | HR 60 | Ht 63.0 in | Wt 171.0 lb

## 2015-10-22 DIAGNOSIS — Z95810 Presence of automatic (implantable) cardiac defibrillator: Secondary | ICD-10-CM

## 2015-10-22 DIAGNOSIS — I429 Cardiomyopathy, unspecified: Secondary | ICD-10-CM | POA: Diagnosis not present

## 2015-10-22 DIAGNOSIS — I059 Rheumatic mitral valve disease, unspecified: Secondary | ICD-10-CM

## 2015-10-22 DIAGNOSIS — I428 Other cardiomyopathies: Secondary | ICD-10-CM

## 2015-10-22 DIAGNOSIS — Z5181 Encounter for therapeutic drug level monitoring: Secondary | ICD-10-CM

## 2015-10-22 LAB — CUP PACEART INCLINIC DEVICE CHECK
Battery Remaining Longevity: 28.8
Date Time Interrogation Session: 20170713130749
HighPow Impedance: 41.439
Implantable Lead Implant Date: 20090410
Implantable Lead Location: 753860
Lead Channel Pacing Threshold Pulse Width: 0.5 ms
Lead Channel Sensing Intrinsic Amplitude: 9.9 mV
Lead Channel Setting Pacing Pulse Width: 0.5 ms
Lead Channel Setting Sensing Sensitivity: 0.3 mV
MDC IDC MSMT BATTERY VOLTAGE: 2.56 V
MDC IDC MSMT LEADCHNL RV IMPEDANCE VALUE: 425 Ohm
MDC IDC MSMT LEADCHNL RV PACING THRESHOLD AMPLITUDE: 0.75 V
MDC IDC MSMT LEADCHNL RV PACING THRESHOLD AMPLITUDE: 0.75 V
MDC IDC MSMT LEADCHNL RV PACING THRESHOLD PULSEWIDTH: 0.5 ms
MDC IDC PG SERIAL: 543269
MDC IDC SET LEADCHNL RV PACING AMPLITUDE: 2.5 V
MDC IDC STAT BRADY RV PERCENT PACED: 0.09 %

## 2015-10-22 LAB — POCT INR: INR: 3.4

## 2015-10-22 NOTE — Progress Notes (Signed)
Patient Care Team: Georgianne Fick, MD as PCP - General (Internal Medicine) Corine Shelter, MD (Pulmonary Disease) Dolores Patty, MD (Cardiology) Duke Salvia, MD (Cardiology)   HPI  Deborah [name] is a 70 y.o. female Seen in followup for ICD implantation for primary prevention with a history of congestive heart failure, secondary to nonischemic cardiomyopathy.   She is s/p mechanical MVR Echo 12/14 ejection fraction around 25%  Echo 8/16  EF 25% She also has a history of mitral valve disease and status post mitral valve replacement with a St. Jude valve in 2005    Denies chest pain shortness of breath orthopnea or peripheral edema.  Tolerating meds  Past Medical History  Diagnosis Date  . CHF (congestive heart failure) (HCC)     --Non-ischemic CM EF 30-35% by echo 9/09 (Previous 25%)  --s/p St. Jude single chamber ICD 04/09 --Minimal  CAD by cath 2004 LAD 20% LCX 20% Ramus 30% RCA nl --echo 05/11 EF 20-25%  . MR (congenital mitral regurgitation)     Severe MR s/p St. Jude mechanical MVR by Gasper Lloyd 2005  . Aortic stenosis, mild     mean gradient echo 09/09  . Diabetes mellitus     type 2  . Kidney stones   . Hyperlipidemia   . Hypertension     Past Surgical History  Procedure Laterality Date  . Appendectomy  1979  . Cardiac defibrillator placement  2009  . Insert / replace / remove pacemaker    . Cardiac valve surgery  2005  . Esophagogastroduodenoscopy N/A 12/05/2014    Procedure: ESOPHAGOGASTRODUODENOSCOPY (EGD);  Surgeon: Dorena Cookey, MD;  Location: [facility] ENDOSCOPY;  Service: Endoscopy;  Laterality: N/A;    Current Outpatient Prescriptions  Medication Sig Dispense Refill  . atorvastatin (LIPITOR) 40 MG tablet Take 1 tablet (40 mg total) by mouth daily. 90 tablet 1  . carvedilol (COREG) 25 MG tablet Take 1 tablet (25 mg total) by mouth 2 (two) times daily with a meal. 180 tablet 3  . Choline Fenofibrate (TRILIPIX) 135 MG capsule Take 1  capsule (135 mg total) by mouth daily. 90 capsule 1  . digoxin (LANOXIN) 0.125 MG tablet Take 0.125 mg by mouth daily.    Marland Kitchen HYDROcodone-acetaminophen (VICODIN) 5-500 MG per tablet Take 1 tablet by mouth every 8 (eight) hours as needed for pain.     . metFORMIN (GLUCOPHAGE) 1000 MG tablet Take 1,000 mg by mouth 2 (two) times daily with a meal.     . ondansetron (ZOFRAN) 4 MG tablet Take 1 tablet (4 mg total) by mouth every 6 (six) hours as needed for nausea. 20 tablet 0  . pantoprazole (PROTONIX) 40 MG tablet Take 1 tablet (40 mg total) by mouth 2 (two) times daily. 60 tablet 1  . quinapril (ACCUPRIL) 20 MG tablet Take 1 tablet (20 mg total) by mouth daily. 90 tablet 3  . spironolactone (ALDACTONE) 25 MG tablet Take 25 mg by mouth daily.    Marland Kitchen warfarin (COUMADIN) 5 MG tablet Take as directed by coumadin clinic 150 tablet 3   No current facility-administered medications for this visit.    Allergies  Allergen Reactions  . Strawberry Extract Rash    Rash & itching.    Review of Systems negative except from HPI and PMH  Physical Exam BP 122/78 mmHg  Pulse 60  Ht  (1.6 m)  Wt 171 lb (77.565 kg)  BMI 30.30 kg/m2  SpO2 96% Well developed  and well nourished in no acute distress HENT normal E scleral and icterus clear Neck Supple JVP flat; carotids brisk and full Clear to ausculation  *Regular rate and rhythm, mechanical S1 and 2/6 murmur  Soft with active bowel sounds No clubbing cyanosis  Edema Alert and oriented, grossly normal motor and sensory function Skin Warm and Dry  ECG demonstrates NSR At 60 Intervals 14/14/42 IVCD    Assessment and  Plan  Valve replacement-mitral-mechanical  Nonischemic cardiomyopathy  Hypertension  Implantable defibrillator-St. Jude   Defibrillator is working appropriately.  Previously she needs surveillance laboratories were her amiodarone and her digoxin   Blood pressure well controlled  Euvolemic continue current meds

## 2015-10-22 NOTE — Patient Instructions (Signed)
Medication Instructions: - Your physician recommends that you continue on your current medications as directed. Please refer to the Current Medication list given to you today.  Labwork: - Your physician recommends that you have lab work today:BMP  Procedures/Testing: - none  Follow-Up: - Your physician wants you to follow-up in: 6 months with Dr. Gala Romney in the CHF clinic & 1 year with Dr. Graciela Husbands. You will receive a reminder letter in the mail two months in advance. If you don't receive a letter, please call our office to schedule the follow-up appointment.   Any Additional Special Instructions Will Be Listed Below (If Applicable).     If you need a refill on your cardiac medications before your next appointment, please call your pharmacy.

## 2015-10-23 LAB — BASIC METABOLIC PANEL
BUN: 13 mg/dL (ref 7–25)
CALCIUM: 8.9 mg/dL (ref 8.6–10.4)
CO2: 23 mmol/L (ref 20–31)
CREATININE: 1.14 mg/dL — AB (ref 0.60–0.93)
Chloride: 109 mmol/L (ref 98–110)
GLUCOSE: 99 mg/dL (ref 65–99)
Potassium: 4.2 mmol/L (ref 3.5–5.3)
Sodium: 143 mmol/L (ref 135–146)

## 2015-11-12 ENCOUNTER — Ambulatory Visit (INDEPENDENT_AMBULATORY_CARE_PROVIDER_SITE_OTHER): Payer: Commercial Managed Care - HMO

## 2015-11-12 DIAGNOSIS — I059 Rheumatic mitral valve disease, unspecified: Secondary | ICD-10-CM | POA: Diagnosis not present

## 2015-11-12 DIAGNOSIS — Z5181 Encounter for therapeutic drug level monitoring: Secondary | ICD-10-CM | POA: Diagnosis not present

## 2015-11-12 LAB — POCT INR: INR: 2.9

## 2015-11-23 DIAGNOSIS — M179 Osteoarthritis of knee, unspecified: Secondary | ICD-10-CM | POA: Diagnosis not present

## 2015-11-23 DIAGNOSIS — M25562 Pain in left knee: Secondary | ICD-10-CM | POA: Diagnosis not present

## 2015-12-10 ENCOUNTER — Ambulatory Visit (INDEPENDENT_AMBULATORY_CARE_PROVIDER_SITE_OTHER): Payer: Commercial Managed Care - HMO | Admitting: *Deleted

## 2015-12-10 DIAGNOSIS — I059 Rheumatic mitral valve disease, unspecified: Secondary | ICD-10-CM

## 2015-12-10 DIAGNOSIS — Z5181 Encounter for therapeutic drug level monitoring: Secondary | ICD-10-CM

## 2015-12-10 LAB — POCT INR: INR: 2.4

## 2015-12-16 DIAGNOSIS — Z780 Asymptomatic menopausal state: Secondary | ICD-10-CM | POA: Diagnosis not present

## 2015-12-16 DIAGNOSIS — I10 Essential (primary) hypertension: Secondary | ICD-10-CM | POA: Diagnosis not present

## 2015-12-16 DIAGNOSIS — E782 Mixed hyperlipidemia: Secondary | ICD-10-CM | POA: Diagnosis not present

## 2015-12-16 DIAGNOSIS — Z0000 Encounter for general adult medical examination without abnormal findings: Secondary | ICD-10-CM | POA: Diagnosis not present

## 2015-12-16 DIAGNOSIS — E1165 Type 2 diabetes mellitus with hyperglycemia: Secondary | ICD-10-CM | POA: Diagnosis not present

## 2015-12-16 DIAGNOSIS — M150 Primary generalized (osteo)arthritis: Secondary | ICD-10-CM | POA: Diagnosis not present

## 2015-12-21 ENCOUNTER — Ambulatory Visit (INDEPENDENT_AMBULATORY_CARE_PROVIDER_SITE_OTHER): Payer: Commercial Managed Care - HMO | Admitting: Pharmacist

## 2015-12-21 DIAGNOSIS — Z5181 Encounter for therapeutic drug level monitoring: Secondary | ICD-10-CM | POA: Diagnosis not present

## 2015-12-21 DIAGNOSIS — I059 Rheumatic mitral valve disease, unspecified: Secondary | ICD-10-CM | POA: Diagnosis not present

## 2015-12-21 LAB — POCT INR: INR: 5.3

## 2015-12-25 ENCOUNTER — Ambulatory Visit (INDEPENDENT_AMBULATORY_CARE_PROVIDER_SITE_OTHER): Payer: Commercial Managed Care - HMO | Admitting: *Deleted

## 2015-12-25 DIAGNOSIS — E1165 Type 2 diabetes mellitus with hyperglycemia: Secondary | ICD-10-CM | POA: Diagnosis not present

## 2015-12-25 DIAGNOSIS — I2510 Atherosclerotic heart disease of native coronary artery without angina pectoris: Secondary | ICD-10-CM | POA: Diagnosis not present

## 2015-12-25 DIAGNOSIS — I059 Rheumatic mitral valve disease, unspecified: Secondary | ICD-10-CM | POA: Diagnosis not present

## 2015-12-25 DIAGNOSIS — E782 Mixed hyperlipidemia: Secondary | ICD-10-CM | POA: Diagnosis not present

## 2015-12-25 DIAGNOSIS — Z5181 Encounter for therapeutic drug level monitoring: Secondary | ICD-10-CM

## 2015-12-25 DIAGNOSIS — I429 Cardiomyopathy, unspecified: Secondary | ICD-10-CM | POA: Diagnosis not present

## 2015-12-25 LAB — POCT INR: INR: 1.7

## 2016-01-04 ENCOUNTER — Ambulatory Visit (INDEPENDENT_AMBULATORY_CARE_PROVIDER_SITE_OTHER): Payer: Commercial Managed Care - HMO | Admitting: *Deleted

## 2016-01-04 DIAGNOSIS — I059 Rheumatic mitral valve disease, unspecified: Secondary | ICD-10-CM

## 2016-01-04 DIAGNOSIS — Z5181 Encounter for therapeutic drug level monitoring: Secondary | ICD-10-CM

## 2016-01-04 LAB — POCT INR: INR: 2.7

## 2016-01-20 ENCOUNTER — Ambulatory Visit (INDEPENDENT_AMBULATORY_CARE_PROVIDER_SITE_OTHER): Payer: Commercial Managed Care - HMO | Admitting: *Deleted

## 2016-01-20 DIAGNOSIS — Z5181 Encounter for therapeutic drug level monitoring: Secondary | ICD-10-CM

## 2016-01-20 DIAGNOSIS — I059 Rheumatic mitral valve disease, unspecified: Secondary | ICD-10-CM | POA: Diagnosis not present

## 2016-01-20 LAB — POCT INR: INR: 3.3

## 2016-01-21 ENCOUNTER — Ambulatory Visit (INDEPENDENT_AMBULATORY_CARE_PROVIDER_SITE_OTHER): Payer: Commercial Managed Care - HMO | Admitting: *Deleted

## 2016-01-21 DIAGNOSIS — I428 Other cardiomyopathies: Secondary | ICD-10-CM

## 2016-01-21 NOTE — Progress Notes (Signed)
Remote ICD transmission.   

## 2016-01-22 ENCOUNTER — Encounter: Payer: Self-pay | Admitting: Cardiology

## 2016-02-15 ENCOUNTER — Ambulatory Visit (INDEPENDENT_AMBULATORY_CARE_PROVIDER_SITE_OTHER): Payer: Commercial Managed Care - HMO

## 2016-02-15 DIAGNOSIS — Z5181 Encounter for therapeutic drug level monitoring: Secondary | ICD-10-CM | POA: Diagnosis not present

## 2016-02-15 DIAGNOSIS — I059 Rheumatic mitral valve disease, unspecified: Secondary | ICD-10-CM

## 2016-02-15 LAB — POCT INR: INR: 2.5

## 2016-02-22 LAB — CUP PACEART REMOTE DEVICE CHECK
Battery Remaining Longevity: 11 mo
Battery Voltage: 2.53 V
HighPow Impedance: 40 Ohm
Implantable Lead Location: 753860
Implantable Pulse Generator Implant Date: 20090410
Lead Channel Setting Pacing Amplitude: 2.5 V
Lead Channel Setting Pacing Pulse Width: 0.5 ms
Lead Channel Setting Sensing Sensitivity: 0.3 mV
MDC IDC LEAD IMPLANT DT: 20090410
MDC IDC MSMT LEADCHNL RV IMPEDANCE VALUE: 400 Ohm
MDC IDC MSMT LEADCHNL RV PACING THRESHOLD AMPLITUDE: 0.75 V
MDC IDC MSMT LEADCHNL RV PACING THRESHOLD PULSEWIDTH: 0.5 ms
MDC IDC MSMT LEADCHNL RV SENSING INTR AMPL: 8.7 mV
MDC IDC SESS DTM: 20171012065815
MDC IDC STAT BRADY RV PERCENT PACED: 1 %
Pulse Gen Serial Number: 543269

## 2016-02-22 NOTE — Progress Notes (Signed)
Normal remote reviewed. Nearing ERI  Next [employer] 04/21/16

## 2016-03-14 ENCOUNTER — Ambulatory Visit (INDEPENDENT_AMBULATORY_CARE_PROVIDER_SITE_OTHER): Payer: Commercial Managed Care - HMO | Admitting: *Deleted

## 2016-03-14 DIAGNOSIS — I059 Rheumatic mitral valve disease, unspecified: Secondary | ICD-10-CM | POA: Diagnosis not present

## 2016-03-14 DIAGNOSIS — Z5181 Encounter for therapeutic drug level monitoring: Secondary | ICD-10-CM

## 2016-03-14 LAB — POCT INR: INR: 2.1

## 2016-03-25 DIAGNOSIS — I2510 Atherosclerotic heart disease of native coronary artery without angina pectoris: Secondary | ICD-10-CM | POA: Diagnosis not present

## 2016-03-25 DIAGNOSIS — M5432 Sciatica, left side: Secondary | ICD-10-CM | POA: Diagnosis not present

## 2016-03-25 DIAGNOSIS — E782 Mixed hyperlipidemia: Secondary | ICD-10-CM | POA: Diagnosis not present

## 2016-03-25 DIAGNOSIS — E1165 Type 2 diabetes mellitus with hyperglycemia: Secondary | ICD-10-CM | POA: Diagnosis not present

## 2016-04-05 ENCOUNTER — Ambulatory Visit (INDEPENDENT_AMBULATORY_CARE_PROVIDER_SITE_OTHER): Payer: Commercial Managed Care - HMO | Admitting: *Deleted

## 2016-04-05 DIAGNOSIS — Z5181 Encounter for therapeutic drug level monitoring: Secondary | ICD-10-CM

## 2016-04-05 DIAGNOSIS — I059 Rheumatic mitral valve disease, unspecified: Secondary | ICD-10-CM

## 2016-04-05 LAB — POCT INR: INR: 2.8

## 2016-04-06 ENCOUNTER — Other Ambulatory Visit: Payer: Self-pay | Admitting: Internal Medicine

## 2016-04-21 ENCOUNTER — Ambulatory Visit (INDEPENDENT_AMBULATORY_CARE_PROVIDER_SITE_OTHER): Payer: Medicare HMO | Admitting: *Deleted

## 2016-04-21 DIAGNOSIS — I428 Other cardiomyopathies: Secondary | ICD-10-CM | POA: Diagnosis not present

## 2016-04-21 NOTE — Progress Notes (Signed)
Remote ICD transmission.   

## 2016-04-22 ENCOUNTER — Encounter: Payer: Self-pay | Admitting: Cardiology

## 2016-05-05 LAB — CUP PACEART REMOTE DEVICE CHECK
Brady Statistic RV Percent Paced: 1 %
HIGH POWER IMPEDANCE MEASURED VALUE: 42 Ohm
Implantable Lead Implant Date: 20090410
Implantable Lead Model: 7120
Lead Channel Impedance Value: 380 Ohm
Lead Channel Pacing Threshold Amplitude: 0.75 V
Lead Channel Sensing Intrinsic Amplitude: 10.3 mV
MDC IDC LEAD LOCATION: 753860
MDC IDC MSMT BATTERY REMAINING LONGEVITY: 5 mo
MDC IDC MSMT BATTERY VOLTAGE: 2.5 V
MDC IDC MSMT LEADCHNL RV PACING THRESHOLD PULSEWIDTH: 0.5 ms
MDC IDC PG IMPLANT DT: 20090410
MDC IDC SESS DTM: 20180111112859
MDC IDC SET LEADCHNL RV PACING AMPLITUDE: 2.5 V
MDC IDC SET LEADCHNL RV PACING PULSEWIDTH: 0.5 ms
MDC IDC SET LEADCHNL RV SENSING SENSITIVITY: 0.3 mV
Pulse Gen Serial Number: 543269

## 2016-05-06 ENCOUNTER — Ambulatory Visit (INDEPENDENT_AMBULATORY_CARE_PROVIDER_SITE_OTHER): Payer: Medicare HMO | Admitting: *Deleted

## 2016-05-06 DIAGNOSIS — I059 Rheumatic mitral valve disease, unspecified: Secondary | ICD-10-CM

## 2016-05-06 DIAGNOSIS — Z5181 Encounter for therapeutic drug level monitoring: Secondary | ICD-10-CM | POA: Diagnosis not present

## 2016-05-06 LAB — POCT INR: INR: 2.5

## 2016-06-03 ENCOUNTER — Ambulatory Visit (INDEPENDENT_AMBULATORY_CARE_PROVIDER_SITE_OTHER): Payer: Medicare HMO | Admitting: *Deleted

## 2016-06-03 DIAGNOSIS — Z5181 Encounter for therapeutic drug level monitoring: Secondary | ICD-10-CM

## 2016-06-03 DIAGNOSIS — I059 Rheumatic mitral valve disease, unspecified: Secondary | ICD-10-CM

## 2016-06-03 LAB — POCT INR: INR: 1.9

## 2016-06-06 ENCOUNTER — Encounter: Payer: Medicare HMO | Admitting: *Deleted

## 2016-06-06 ENCOUNTER — Telehealth: Payer: Self-pay | Admitting: Cardiology

## 2016-06-06 NOTE — Telephone Encounter (Signed)
LMOVM reminding pt to send remote transmission.   

## 2016-06-10 DIAGNOSIS — I2510 Atherosclerotic heart disease of native coronary artery without angina pectoris: Secondary | ICD-10-CM | POA: Diagnosis not present

## 2016-06-10 DIAGNOSIS — E782 Mixed hyperlipidemia: Secondary | ICD-10-CM | POA: Diagnosis not present

## 2016-06-10 DIAGNOSIS — E1165 Type 2 diabetes mellitus with hyperglycemia: Secondary | ICD-10-CM | POA: Diagnosis not present

## 2016-06-13 ENCOUNTER — Telehealth: Payer: Self-pay | Admitting: Cardiology

## 2016-06-13 NOTE — Telephone Encounter (Signed)
Pt called and stated that her ICD beeped or vibrated. Informed pt that her ICD can not beep. Instructed her to send a remote transmission. Pt stated that feels fine.

## 2016-06-13 NOTE — Telephone Encounter (Signed)
Transmission received. ICD ERI as of 06/12/16.  [name] made aware and that I will have scheduling reach out to arrange f/u with Dr. Graciela Husbands. I told her that the device will continue to vibrate every 10 hours for 16 times and then it will stop- no need to worry. She verbalizes understanding.

## 2016-06-17 DIAGNOSIS — I429 Cardiomyopathy, unspecified: Secondary | ICD-10-CM | POA: Diagnosis not present

## 2016-06-17 DIAGNOSIS — E1165 Type 2 diabetes mellitus with hyperglycemia: Secondary | ICD-10-CM | POA: Diagnosis not present

## 2016-06-17 DIAGNOSIS — I2510 Atherosclerotic heart disease of native coronary artery without angina pectoris: Secondary | ICD-10-CM | POA: Diagnosis not present

## 2016-06-17 DIAGNOSIS — F1190 Opioid use, unspecified, uncomplicated: Secondary | ICD-10-CM | POA: Diagnosis not present

## 2016-06-21 ENCOUNTER — Ambulatory Visit (INDEPENDENT_AMBULATORY_CARE_PROVIDER_SITE_OTHER): Payer: Medicare HMO | Admitting: Pharmacist

## 2016-06-21 DIAGNOSIS — I059 Rheumatic mitral valve disease, unspecified: Secondary | ICD-10-CM | POA: Diagnosis not present

## 2016-06-21 DIAGNOSIS — Z5181 Encounter for therapeutic drug level monitoring: Secondary | ICD-10-CM | POA: Diagnosis not present

## 2016-06-21 LAB — POCT INR: INR: 2

## 2016-06-30 ENCOUNTER — Other Ambulatory Visit: Payer: Self-pay | Admitting: Internal Medicine

## 2016-06-30 ENCOUNTER — Other Ambulatory Visit: Payer: Self-pay | Admitting: Pharmacist

## 2016-06-30 MED ORDER — WARFARIN SODIUM 5 MG PO TABS: ORAL_TABLET | ORAL | 3 refills | Status: DC

## 2016-07-01 ENCOUNTER — Other Ambulatory Visit: Payer: Self-pay | Admitting: Pharmacist

## 2016-07-01 ENCOUNTER — Other Ambulatory Visit: Payer: Self-pay | Admitting: Internal Medicine

## 2016-07-01 MED ORDER — WARFARIN SODIUM 5 MG PO TABS: ORAL_TABLET | ORAL | 3 refills | Status: DC

## 2016-07-08 ENCOUNTER — Ambulatory Visit (HOSPITAL_COMMUNITY)
Admission: RE | Admit: 2016-07-08 | Discharge: 2016-07-08 | Disposition: A | Discharge status: Home / Self Care | Payer: Medicare HMO | Source: Ambulatory Visit | Attending: Cardiology | Admitting: Cardiology

## 2016-07-08 ENCOUNTER — Encounter (HOSPITAL_COMMUNITY): Payer: Self-pay

## 2016-07-08 ENCOUNTER — Ambulatory Visit (INDEPENDENT_AMBULATORY_CARE_PROVIDER_SITE_OTHER): Payer: Medicare HMO | Admitting: *Deleted

## 2016-07-08 VITALS — BP 130/86 | HR 74 | Wt 174.8 lb

## 2016-07-08 DIAGNOSIS — N183 Chronic kidney disease, stage 3 unspecified: Secondary | ICD-10-CM

## 2016-07-08 DIAGNOSIS — Z87442 Personal history of urinary calculi: Secondary | ICD-10-CM | POA: Diagnosis not present

## 2016-07-08 DIAGNOSIS — I059 Rheumatic mitral valve disease, unspecified: Secondary | ICD-10-CM

## 2016-07-08 DIAGNOSIS — Z952 Presence of prosthetic heart valve: Secondary | ICD-10-CM | POA: Diagnosis not present

## 2016-07-08 DIAGNOSIS — E785 Hyperlipidemia, unspecified: Secondary | ICD-10-CM | POA: Insufficient documentation

## 2016-07-08 DIAGNOSIS — I429 Cardiomyopathy, unspecified: Secondary | ICD-10-CM | POA: Diagnosis not present

## 2016-07-08 DIAGNOSIS — Z7984 Long term (current) use of oral hypoglycemic drugs: Secondary | ICD-10-CM | POA: Diagnosis not present

## 2016-07-08 DIAGNOSIS — Z7901 Long term (current) use of anticoagulants: Secondary | ICD-10-CM | POA: Insufficient documentation

## 2016-07-08 DIAGNOSIS — E1122 Type 2 diabetes mellitus with diabetic chronic kidney disease: Secondary | ICD-10-CM | POA: Insufficient documentation

## 2016-07-08 DIAGNOSIS — Z9889 Other specified postprocedural states: Secondary | ICD-10-CM | POA: Insufficient documentation

## 2016-07-08 DIAGNOSIS — I130 Hypertensive heart and chronic kidney disease with heart failure and stage 1 through stage 4 chronic kidney disease, or unspecified chronic kidney disease: Secondary | ICD-10-CM | POA: Insufficient documentation

## 2016-07-08 DIAGNOSIS — I428 Other cardiomyopathies: Secondary | ICD-10-CM

## 2016-07-08 DIAGNOSIS — I350 Nonrheumatic aortic (valve) stenosis: Secondary | ICD-10-CM | POA: Diagnosis not present

## 2016-07-08 DIAGNOSIS — I2510 Atherosclerotic heart disease of native coronary artery without angina pectoris: Secondary | ICD-10-CM | POA: Diagnosis not present

## 2016-07-08 DIAGNOSIS — I110 Hypertensive heart disease with heart failure: Secondary | ICD-10-CM

## 2016-07-08 DIAGNOSIS — Z5181 Encounter for therapeutic drug level monitoring: Secondary | ICD-10-CM | POA: Diagnosis not present

## 2016-07-08 DIAGNOSIS — I5022 Chronic systolic (congestive) heart failure: Secondary | ICD-10-CM | POA: Insufficient documentation

## 2016-07-08 LAB — BASIC METABOLIC PANEL
Anion gap: 8 (ref 5–15)
BUN: 12 mg/dL (ref 6–20)
CO2: 27 mmol/L (ref 22–32)
Calcium: 9.2 mg/dL (ref 8.9–10.3)
Chloride: 108 mmol/L (ref 101–111)
Creatinine, Ser: 1.01 mg/dL — ABNORMAL HIGH (ref 0.44–1.00)
GFR calc Af Amer: >60 mL/min (ref >60)
GFR, EST NON AFRICAN AMERICAN: 54 mL/min — AB (ref >60)
Glucose, Bld: 127 mg/dL — ABNORMAL HIGH (ref 65–99)
POTASSIUM: 4.5 mmol/L (ref 3.5–5.1)
SODIUM: 143 mmol/L (ref 135–145)

## 2016-07-08 LAB — DIGOXIN LEVEL: DIGOXIN LVL: 0.6 ng/mL — AB (ref 0.8–2.0)

## 2016-07-08 LAB — POCT INR: INR: 2.3

## 2016-07-08 MED ORDER — SACUBITRIL-VALSARTAN 24-26 MG PO TABS: 1.0000 | ORAL_TABLET | Freq: Two times a day (BID) | ORAL | 3 refills | Status: DC

## 2016-07-08 NOTE — Progress Notes (Signed)
Patient ID: Deborah [name], female   DOB: June 18, 1945, 71 y.o.   MRN: [id number] PCP: Petra Kuba HF MD:     HPI: [name] is a very pleasant 71 year old woman with a history of congestive heart failure secondary to nonischemic cardiomyopathy, ejection fraction 25% s/p St Jude single chamber ICD.  She also has a history of mitral valve disease and status post mitral valve replacement with a St. Jude valve in 2005.  Remainder of her history is notable for diabetes, hypertension, hyperlipidemia, and kidney stones.   CPX performed June 2011 pVO2 16.8 slope 26.1 RER 1.11 O2 pulse 90% predicted O2 sat 87% at peak with possible hypoventilation.  05/12/11 ECHO 20-25%.  Mechanical MVR appears normal.   03/19/13 Echo 25% Mechanical MVR appears normal.   12/04/14 Echo EF 20-25%  She returns today for HF follow up, she has not been seen in 2 years. Overall feeling ok, but has noticed increased SOB over the past 6 months with stairs, can no longer walk throughout the grocery store with SOB, has to stop a few times. No resting SOB. Does not weigh at home, weight only 3 pounds up from when she was seen 2 years ago. Drinking less than 2L a day, but does eat a high salt diet. She is taking her medications, does not miss doses often. She is concerned today about her ICD, it has been vibrating, device is at [facility].     ROS: All systems negative except as listed in HPI, PMH and Problem List.  Past Medical History:  Diagnosis Date  . Aortic stenosis, mild    mean gradient echo 09/09  . CHF (congestive heart failure) (HCC)    --Non-ischemic CM EF 30-35% by echo 9/09 (Previous 25%)  --s/p St. Jude single chamber ICD 04/09 --Minimal  CAD by cath 2004 LAD 20% LCX 20% Ramus 30% RCA nl --echo 05/11 EF 20-25%  . Diabetes mellitus    type 2  . Hyperlipidemia   . Hypertension   . Kidney stones   . MR (congenital mitral regurgitation)    Severe MR s/p St. Jude mechanical MVR by Gasper Lloyd 2005    Current  Outpatient Prescriptions  Medication Sig Dispense Refill  . atorvastatin (LIPITOR) 40 MG tablet Take 1 tablet (40 mg total) by mouth daily. 90 tablet 1  . carvedilol (COREG) 25 MG tablet TAKE 1 TABLET TWICE DAILY WITH MEALS 180 tablet 1  . Choline Fenofibrate (TRILIPIX) 135 MG capsule Take 1 capsule (135 mg total) by mouth daily. 90 capsule 1  . digoxin (LANOXIN) 0.125 MG tablet Take 0.125 mg by mouth daily.    Marland Kitchen HYDROcodone-acetaminophen (VICODIN) 5-500 MG per tablet Take 1 tablet by mouth every 8 (eight) hours as needed for pain.     . metFORMIN (GLUCOPHAGE) 1000 MG tablet Take 1,000 mg by mouth 2 (two) times daily with a meal.     . ondansetron (ZOFRAN) 4 MG tablet Take 1 tablet (4 mg total) by mouth every 6 (six) hours as needed for nausea. 20 tablet 0  . pantoprazole (PROTONIX) 40 MG tablet Take 1 tablet (40 mg total) by mouth 2 (two) times daily. 60 tablet 1  . quinapril (ACCUPRIL) 20 MG tablet Take 1 tablet (20 mg total) by mouth daily. 90 tablet 3  . warfarin (COUMADIN) 5 MG tablet Take as directed by coumadin clinic 150 tablet 3   No current facility-administered medications for this encounter.      PHYSICAL EXAM: Vitals:   07/08/16  1009  BP: 130/86  Pulse: 74  SpO2: 98%  Weight: 174 lb 12.8 oz (79.3 kg)   General:    Well appearing no distress, ambulated into clinic. Daughter present at appointment.  HEENT Normal Neck: JVP 5-6 cm. carotids were brisk. no bruits. No lymphadenopathy or thyromegaly. Lungs: Clear to ausculation bilaterally.  Cor: Regular rate and rhythm, mechanical S2. No murmurs. Abdomen: Soft, nontender, non distended.  Extremities: trace pedal edema, warm.  Skin Warm and dry Neuro  oriented x 3  cranial nerves II-XII grossly intact. moves all 4 extremities without difficulty.   ASSESSMENT & PLAN:  1. Chronic Systolic Heart Failure, NICM EF 20-25% in 11/2014, St. Jude ICD NYHA III.  - Has declined functionally over the past 2 years, more SOB now NYHA  III - Stop accupril and start Entresto 24/26mg  BID pending renal function is stable.  - Get Echo to evaluate EF with worsening functional decline, last Echo was in 2016  - Continue goal dose Coreg 25mg  BID.  - Continue digoxin, get dig level today.   2. S/P MVR - on coumadin - INR followed by [employer]. Office.  3. HTN - Well controlled on current regimen 4. CKD stage III: - Will get BMET today 5. NICM s/p St. Jude ICD.  - Device at ERI on 06/12/16. - Has an appt .with Dr. Graciela Husbands to discuss generator change.   Follow up in one month with Dr. Gala Romney, will have pharmacy see her as well if she needs co pay assistance with Entresto, 30 day free card provided today.   Little Ishikawa, NP-C  10:20 AM

## 2016-07-08 NOTE — Patient Instructions (Signed)
Labs today (will call for abnormal results, otherwise no news is good news)  STOP taking Accupril, START taking Entresto 24-26 mg (1 Tablet) Two times Daily once you have stopped Accupril for 36 hours.  Do not start before.   Echo and Follow up in 1 Month with Dr. Gala Romney

## 2016-07-11 ENCOUNTER — Telehealth (HOSPITAL_COMMUNITY): Payer: Self-pay | Admitting: Pharmacist

## 2016-07-11 NOTE — Telephone Encounter (Addendum)
Entresto 24-26 mg BID PA approved by [facility] Part D through 07/12/18.   Tyler Deis. Bonnye Fava, PharmD, BCPS, CPP Clinical Pharmacist Pager: [phone] Phone: [phone] 07/11/2016 10:14 AM

## 2016-07-13 ENCOUNTER — Encounter: Payer: Self-pay | Admitting: *Deleted

## 2016-07-13 ENCOUNTER — Encounter: Payer: Self-pay | Admitting: Internal Medicine

## 2016-07-13 ENCOUNTER — Ambulatory Visit (INDEPENDENT_AMBULATORY_CARE_PROVIDER_SITE_OTHER): Payer: Medicare HMO | Admitting: Internal Medicine

## 2016-07-13 VITALS — BP 136/80 | HR 80 | Ht 63.0 in | Wt 174.0 lb

## 2016-07-13 DIAGNOSIS — I059 Rheumatic mitral valve disease, unspecified: Secondary | ICD-10-CM | POA: Diagnosis not present

## 2016-07-13 DIAGNOSIS — I428 Other cardiomyopathies: Secondary | ICD-10-CM

## 2016-07-13 DIAGNOSIS — Z01812 Encounter for preprocedural laboratory examination: Secondary | ICD-10-CM | POA: Diagnosis not present

## 2016-07-13 DIAGNOSIS — Z95810 Presence of automatic (implantable) cardiac defibrillator: Secondary | ICD-10-CM | POA: Diagnosis not present

## 2016-07-13 LAB — CUP PACEART INCLINIC DEVICE CHECK
HighPow Impedance: 42.9948
Implantable Lead Implant Date: 20090410
Implantable Lead Model: 7120
Implantable Pulse Generator Implant Date: 20090410
Lead Channel Pacing Threshold Amplitude: 0.75 V
Lead Channel Pacing Threshold Pulse Width: 0.5 ms
Lead Channel Setting Pacing Pulse Width: 0.5 ms
Lead Channel Setting Sensing Sensitivity: 0.3 mV
MDC IDC LEAD LOCATION: 753860
MDC IDC MSMT BATTERY VOLTAGE: 2.42 V
MDC IDC MSMT LEADCHNL RV IMPEDANCE VALUE: 387.5 Ohm
MDC IDC MSMT LEADCHNL RV SENSING INTR AMPL: 9.7 mV
MDC IDC PG SERIAL: 543269
MDC IDC SESS DTM: 20180404135514
MDC IDC SET LEADCHNL RV PACING AMPLITUDE: 2.5 V
MDC IDC STAT BRADY RV PERCENT PACED: 0.02 %

## 2016-07-13 MED ORDER — FUROSEMIDE 40 MG PO TABS: 40.0000 mg | ORAL_TABLET | ORAL | 3 refills | Status: DC

## 2016-07-13 NOTE — Patient Instructions (Signed)
Medication Instructions: - Your physician has recommended you make the following change in your medication:  1) Start lasix (furosemide) 40 mg- take one tablet by mouth once EVERY OTHER day  Labwork: - Your physician recommends that you return for lab work: 07/25/16 (bmp/cbc/inr)  Procedures/Testing: - Your physician has recommended that you have a defibrillator generator (battery) change.  Follow-Up: - Your physician recommends that you schedule a follow-up appointment in: about 14 days (from 08/01/16) with the Device Clinic for a wound check appointment   Any Additional Special Instructions Will Be Listed Below (If Applicable).     If you need a refill on your cardiac medications before your next appointment, please call your pharmacy.

## 2016-07-13 NOTE — Progress Notes (Signed)
Patient Care Team: Georgianne Fick, MD as PCP - General (Internal Medicine) Corine Shelter, MD (Pulmonary Disease) Dolores Patty, MD (Cardiology) Duke Salvia, MD (Cardiology)   HPI  Deborah [name] is a 71 y.o. female Seen in followup for ICD implantation for primary prevention with a history of congestive heart failure, secondary to nonischemic cardiomyopathy.   She is s/p mechanical MVR St. Jude valve in 2005.  Her ICD has reached ERI    She was quite discombobulated by the vibratory alert; she is complaining that her breathing is worse. She's not had any edema.  She's just been started on Entresto.  Echo 12/14 ejection fraction around 25%  Echo 8/16  EF 25%    Past Medical History:  Diagnosis Date  . Aortic stenosis, mild    mean gradient echo 09/09  . CHF (congestive heart failure) (HCC)    --Non-ischemic CM EF 30-35% by echo 9/09 (Previous 25%)  --s/p St. Jude single chamber ICD 04/09 --Minimal  CAD by cath 2004 LAD 20% LCX 20% Ramus 30% RCA nl --echo 05/11 EF 20-25%  . Diabetes mellitus    type 2  . Hyperlipidemia   . Hypertension   . Kidney stones   . MR (congenital mitral regurgitation)    Severe MR s/p St. Jude mechanical MVR by Gasper Lloyd 2005    Past Surgical History:  Procedure Laterality Date  . APPENDECTOMY  1979  . CARDIAC DEFIBRILLATOR PLACEMENT  2009  . CARDIAC VALVE SURGERY  2005  . ESOPHAGOGASTRODUODENOSCOPY N/A 12/05/2014   Procedure: ESOPHAGOGASTRODUODENOSCOPY (EGD);  Surgeon: Dorena Cookey, MD;  Location[facility] ENDOSCOPY;  Service: Endoscopy;  Laterality: N/A;  . INSERT / REPLACE / REMOVE PACEMAKER      Current Outpatient Prescriptions  Medication Sig Dispense Refill  . atorvastatin (LIPITOR) 40 MG tablet Take 1 tablet (40 mg total) by mouth daily. 90 tablet 1  . carvedilol (COREG) 25 MG tablet TAKE 1 TABLET TWICE DAILY WITH MEALS 180 tablet 1  . Choline Fenofibrate (TRILIPIX) 135 MG capsule Take 1 capsule (135 mg total) by  mouth daily. 90 capsule 1  . digoxin (LANOXIN) 0.125 MG tablet Take 0.125 mg by mouth daily.    Marland Kitchen HYDROcodone-acetaminophen (VICODIN) 5-500 MG per tablet Take 1 tablet by mouth every 8 (eight) hours as needed for pain.     . metFORMIN (GLUCOPHAGE) 1000 MG tablet Take 1,000 mg by mouth 2 (two) times daily with a meal.     . ondansetron (ZOFRAN) 4 MG tablet Take 1 tablet (4 mg total) by mouth every 6 (six) hours as needed for nausea. 20 tablet 0  . pantoprazole (PROTONIX) 40 MG tablet Take 1 tablet (40 mg total) by mouth 2 (two) times daily. 60 tablet 1  . sacubitril-valsartan (ENTRESTO) 24-26 MG Take 1 tablet by mouth 2 (two) times daily. 60 tablet 3  . warfarin (COUMADIN) 5 MG tablet Take as directed by coumadin clinic 150 tablet 3   No current facility-administered medications for this visit.     Allergies  Allergen Reactions  . Strawberry Extract Rash    Rash & itching.    Review of Systems negative except from HPI and PMH  Physical Exam BP 136/80   Pulse 80   Ht 5\' 3"  (1.6 m)   Wt 174 lb (78.9 kg)   SpO2 98%   BMI 30.82 kg/m  Well developed and well nourished in no acute distress HENT normal E scleral and icterus clear Neck Supple  JVP8; carotids brisk and full Clear to ausculation Regular rate and rhythm, mechanical S1 and 2/6 murmur  Soft with active bowel sounds No clubbing cyanosis  Edema Alert and oriented, grossly normal motor and sensory function Skin Warm and Dry  ECG personally reviewed  Sinus at 72 16/13/40 LVH with QRS widening and repolarization  poor R-wave progression  left axis deviation     Assessment and  Plan  Valve replacement-mitral-mechanical  Nonischemic cardiomyopathy  Hypertension  HFrEF chronic class IIb-3   Implantable defibrillator-St. Jude   Defibrillator is working appropriately. It has reached ERI   We have reviewed the benefits and risks of generator replacement.  These include but are not limited to lead fracture and  infection.  The patient understands, agrees and is willing to proceed.     Blood pressure well controlled  With her worsening shortness of breath and her mild JVD, I will begin her on furosemide 40 mg every other day.  Most recent potassium 4.5 creatinine 1.01 and digoxin level 0.5  s

## 2016-07-18 NOTE — Addendum Note (Signed)
Addended by: Dareen Piano on: 07/18/2016 03:58 PM   Modules accepted: Orders

## 2016-07-20 ENCOUNTER — Telehealth: Payer: Self-pay | Admitting: Internal Medicine

## 2016-07-20 NOTE — Telephone Encounter (Signed)
New Message  Pt c/o medication issue:  1. Name of Medication: Claritin   2. How are you currently taking this medication (dosage and times per day)? Over the counter   3. Are you having a reaction (difficulty breathing--STAT)? No   4. What is your medication issue? Pt sister states pt has a cough and has been taken Claritin but it has not been working. Pt sister would like to know if another over the counter medication pt will be able to take for cough. Please call back to discuss

## 2016-07-20 NOTE — Telephone Encounter (Signed)
Patient was called and made aware that she could also try dextromethorphan for her cough. She was advised to make sure she avoids decongestants. Patient was advised that it can make her drowsy, but will not interact with any of her cardiac medications. Patient advised to follow up with her PCP if she continues to have problems. Patient verbalized understanding.

## 2016-07-20 NOTE — Telephone Encounter (Signed)
She could also try dextromethorphan for her cough and again make sure she avoids decongestants (as this does come formulated with decongestants). She should be advised that this can make her drowsy, but does not interact with any of her cardiac medications.

## 2016-07-20 NOTE — Telephone Encounter (Signed)
Spoke with patient's sister (DPR on file). She states that the patient has had a bad cough since Thursday of last week. She states that the patient has already seen her PCP for this cough and was told to take Claritin and it has not been working. She states that the patient is scheduled for a generator change on 4/23 and would like to get the patient's cough under control before then so that they can proceed with the procedure. I advised her that the patient could try Zyrtec, Coricidin HBP, or cough medicines that do not have decongestants in them. The sister states that they have already tried all of those and they do not work and wants to know if there is anything else that they can try.

## 2016-07-25 ENCOUNTER — Other Ambulatory Visit: Payer: Medicare HMO | Admitting: *Deleted

## 2016-07-25 ENCOUNTER — Ambulatory Visit (INDEPENDENT_AMBULATORY_CARE_PROVIDER_SITE_OTHER): Payer: Medicare HMO

## 2016-07-25 DIAGNOSIS — Z5181 Encounter for therapeutic drug level monitoring: Secondary | ICD-10-CM | POA: Diagnosis not present

## 2016-07-25 DIAGNOSIS — I428 Other cardiomyopathies: Secondary | ICD-10-CM | POA: Diagnosis not present

## 2016-07-25 DIAGNOSIS — I059 Rheumatic mitral valve disease, unspecified: Secondary | ICD-10-CM

## 2016-07-25 DIAGNOSIS — Z01812 Encounter for preprocedural laboratory examination: Secondary | ICD-10-CM | POA: Diagnosis not present

## 2016-07-25 LAB — POCT INR: INR: 4

## 2016-07-26 LAB — BASIC METABOLIC PANEL
BUN / CREAT RATIO: 15 (ref 12–28)
BUN: 20 mg/dL (ref 8–27)
CO2: 29 mmol/L (ref 18–29)
Calcium: 9.1 mg/dL (ref 8.7–10.3)
Chloride: 101 mmol/L (ref 96–106)
Creatinine, Ser: 1.3 mg/dL — ABNORMAL HIGH (ref 0.57–1.00)
GFR, EST AFRICAN AMERICAN: 47 mL/min/{1.73_m2} — AB (ref >59)
GFR, EST NON AFRICAN AMERICAN: 41 mL/min/{1.73_m2} — AB (ref >59)
Glucose: 107 mg/dL — ABNORMAL HIGH (ref 65–99)
POTASSIUM: 5.3 mmol/L — AB (ref 3.5–5.2)
Sodium: 145 mmol/L — ABNORMAL HIGH (ref 134–144)

## 2016-07-26 LAB — CBC WITH DIFFERENTIAL/PLATELET
BASOS ABS: 0 10*3/uL (ref 0.0–0.2)
BASOS: 0 %
EOS (ABSOLUTE): 0.1 10*3/uL (ref 0.0–0.4)
Eos: 2 %
HEMATOCRIT: 36.5 % (ref 34.0–46.6)
HEMOGLOBIN: 12.3 g/dL (ref 11.1–15.9)
IMMATURE GRANS (ABS): 0 10*3/uL (ref 0.0–0.1)
Immature Granulocytes: 0 %
LYMPHS ABS: 1.2 10*3/uL (ref 0.7–3.1)
LYMPHS: 15 %
MCH: 31.4 pg (ref 26.6–33.0)
MCHC: 33.7 g/dL (ref 31.5–35.7)
MCV: 93 fL (ref 79–97)
MONOCYTES: 8 %
Monocytes Absolute: 0.6 10*3/uL (ref 0.1–0.9)
NEUTROS ABS: 6.4 10*3/uL (ref 1.4–7.0)
Neutrophils: 75 %
Platelets: 271 10*3/uL (ref 150–379)
RBC: 3.92 x10E6/uL (ref 3.77–5.28)
RDW: 13.3 % (ref 12.3–15.4)
WBC: 8.5 10*3/uL (ref 3.4–10.8)

## 2016-07-26 LAB — PROTIME-INR
INR: 3.4 — ABNORMAL HIGH (ref 0.8–1.2)
PROTHROMBIN TIME: 33.1 s — AB (ref 9.1–12.0)

## 2016-08-01 ENCOUNTER — Ambulatory Visit (HOSPITAL_COMMUNITY)
Admission: RE | Admit: 2016-08-01 | Discharge: 2016-08-01 | Disposition: A | Discharge status: Home / Self Care | Payer: Medicare HMO | Source: Ambulatory Visit | Attending: Internal Medicine | Admitting: Internal Medicine

## 2016-08-01 ENCOUNTER — Encounter (HOSPITAL_COMMUNITY): Payer: Self-pay | Admitting: Internal Medicine

## 2016-08-01 ENCOUNTER — Encounter (HOSPITAL_COMMUNITY)
Admission: RE | Disposition: A | Discharge status: Home / Self Care | Payer: Self-pay | Source: Ambulatory Visit | Attending: Internal Medicine

## 2016-08-01 DIAGNOSIS — Z7984 Long term (current) use of oral hypoglycemic drugs: Secondary | ICD-10-CM | POA: Insufficient documentation

## 2016-08-01 DIAGNOSIS — I110 Hypertensive heart disease with heart failure: Secondary | ICD-10-CM | POA: Diagnosis not present

## 2016-08-01 DIAGNOSIS — Z7901 Long term (current) use of anticoagulants: Secondary | ICD-10-CM | POA: Diagnosis not present

## 2016-08-01 DIAGNOSIS — I428 Other cardiomyopathies: Secondary | ICD-10-CM | POA: Insufficient documentation

## 2016-08-01 DIAGNOSIS — E785 Hyperlipidemia, unspecified: Secondary | ICD-10-CM | POA: Insufficient documentation

## 2016-08-01 DIAGNOSIS — I350 Nonrheumatic aortic (valve) stenosis: Secondary | ICD-10-CM | POA: Insufficient documentation

## 2016-08-01 DIAGNOSIS — I2510 Atherosclerotic heart disease of native coronary artery without angina pectoris: Secondary | ICD-10-CM | POA: Diagnosis not present

## 2016-08-01 DIAGNOSIS — Z952 Presence of prosthetic heart valve: Secondary | ICD-10-CM | POA: Diagnosis not present

## 2016-08-01 DIAGNOSIS — I5022 Chronic systolic (congestive) heart failure: Secondary | ICD-10-CM | POA: Diagnosis not present

## 2016-08-01 DIAGNOSIS — E119 Type 2 diabetes mellitus without complications: Secondary | ICD-10-CM | POA: Insufficient documentation

## 2016-08-01 DIAGNOSIS — Z4502 Encounter for adjustment and management of automatic implantable cardiac defibrillator: Secondary | ICD-10-CM | POA: Diagnosis not present

## 2016-08-01 HISTORY — PX: ICD GENERATOR CHANGEOUT: EP1231

## 2016-08-01 LAB — BASIC METABOLIC PANEL
Anion gap: 8 (ref 5–15)
BUN: 12 mg/dL (ref 6–20)
CHLORIDE: 105 mmol/L (ref 101–111)
CO2: 28 mmol/L (ref 22–32)
Calcium: 9.3 mg/dL (ref 8.9–10.3)
Creatinine, Ser: 1.05 mg/dL — ABNORMAL HIGH (ref 0.44–1.00)
GFR calc Af Amer: 60 mL/min — ABNORMAL LOW (ref >60)
GFR calc non Af Amer: 52 mL/min — ABNORMAL LOW (ref >60)
Glucose, Bld: 176 mg/dL — ABNORMAL HIGH (ref 65–99)
POTASSIUM: 5.2 mmol/L — AB (ref 3.5–5.1)
SODIUM: 141 mmol/L (ref 135–145)

## 2016-08-01 LAB — PROTIME-INR
INR: 2.51
Prothrombin Time: 27.6 seconds — ABNORMAL HIGH (ref 11.4–15.2)

## 2016-08-01 LAB — SURGICAL PCR SCREEN
MRSA, PCR: NEGATIVE
Staphylococcus aureus: NEGATIVE

## 2016-08-01 LAB — GLUCOSE, CAPILLARY
GLUCOSE-CAPILLARY: 128 mg/dL — AB (ref 65–99)
Glucose-Capillary: 87 mg/dL (ref 65–99)

## 2016-08-01 SURGERY — ICD GENERATOR CHANGEOUT

## 2016-08-01 MED ORDER — FENTANYL CITRATE (PF) 100 MCG/2ML IJ SOLN
INTRAMUSCULAR | Status: DC | PRN
  Administered 2016-08-01 (×2): 25 ug via INTRAVENOUS

## 2016-08-01 MED ORDER — MUPIROCIN 2 % EX OINT
1.0000 "application " | TOPICAL_OINTMENT | Freq: Once | CUTANEOUS | Status: AC
  Administered 2016-08-01: 1 via TOPICAL
  Filled 2016-08-01: qty 22

## 2016-08-01 MED ORDER — ACETAMINOPHEN 325 MG PO TABS: 325.0000 mg | ORAL_TABLET | ORAL | Status: DC | PRN

## 2016-08-01 MED ORDER — MIDAZOLAM HCL 5 MG/5ML IJ SOLN
INTRAMUSCULAR | Status: DC | PRN
  Administered 2016-08-01 (×2): 2 mg via INTRAVENOUS

## 2016-08-01 MED ORDER — SODIUM CHLORIDE 0.9 % IR SOLN
Status: AC
  Filled 2016-08-01: qty 2

## 2016-08-01 MED ORDER — SODIUM CHLORIDE 0.9 % IV SOLN
INTRAVENOUS | Status: DC
  Administered 2016-08-01: 08:00:00 via INTRAVENOUS

## 2016-08-01 MED ORDER — MUPIROCIN 2 % EX OINT
TOPICAL_OINTMENT | CUTANEOUS | Status: AC
  Administered 2016-08-01: 1 via TOPICAL
  Filled 2016-08-01: qty 22

## 2016-08-01 MED ORDER — DIAZEPAM 5 MG PO TABS
ORAL_TABLET | ORAL | Status: AC
  Administered 2016-08-01: 10 mg via ORAL
  Filled 2016-08-01: qty 2

## 2016-08-01 MED ORDER — CEFAZOLIN SODIUM-DEXTROSE 2-4 GM/100ML-% IV SOLN
INTRAVENOUS | Status: AC
  Filled 2016-08-01: qty 100

## 2016-08-01 MED ORDER — SODIUM CHLORIDE 0.9 % IV SOLN: INTRAVENOUS | Status: AC

## 2016-08-01 MED ORDER — ONDANSETRON HCL 4 MG/2ML IJ SOLN: 4.0000 mg | Freq: Four times a day (QID) | INTRAMUSCULAR | Status: DC | PRN

## 2016-08-01 MED ORDER — SODIUM CHLORIDE 0.9 % IR SOLN
80.0000 mg | Status: AC
  Administered 2016-08-01: 80 mg
  Filled 2016-08-01: qty 2

## 2016-08-01 MED ORDER — LIDOCAINE HCL (PF) 1 % IJ SOLN
INTRAMUSCULAR | Status: AC
  Filled 2016-08-01: qty 60

## 2016-08-01 MED ORDER — DIAZEPAM 5 MG PO TABS
10.0000 mg | ORAL_TABLET | Freq: Once | ORAL | Status: AC
  Administered 2016-08-01: 10 mg via ORAL

## 2016-08-01 MED ORDER — LIDOCAINE HCL (PF) 1 % IJ SOLN
INTRAMUSCULAR | Status: DC | PRN
  Administered 2016-08-01: 30 mL

## 2016-08-01 MED ORDER — CEFAZOLIN SODIUM-DEXTROSE 2-4 GM/100ML-% IV SOLN
2.0000 g | INTRAVENOUS | Status: AC
  Administered 2016-08-01: 2 g via INTRAVENOUS
  Filled 2016-08-01: qty 100

## 2016-08-01 MED ORDER — FENTANYL CITRATE (PF) 100 MCG/2ML IJ SOLN
INTRAMUSCULAR | Status: AC
  Filled 2016-08-01: qty 2

## 2016-08-01 MED ORDER — MIDAZOLAM HCL 5 MG/5ML IJ SOLN
INTRAMUSCULAR | Status: AC
  Filled 2016-08-01: qty 5

## 2016-08-01 MED ORDER — CHLORHEXIDINE GLUCONATE 4 % EX LIQD
60.0000 mL | Freq: Once | CUTANEOUS | Status: DC
  Filled 2016-08-01: qty 60

## 2016-08-01 SURGICAL SUPPLY — 5 items
CABLE SURGICAL S-101-97-12 (CABLE) ×3 IMPLANT
HEMOSTAT SURGICEL 2X4 FIBR (HEMOSTASIS) ×2 IMPLANT
ICD ELLIPSE VR CD1411-36C (ICD Generator) ×3 IMPLANT
PAD DEFIB LIFELINK (PAD) ×2 IMPLANT
TRAY PACEMAKER INSERTION (PACKS) ×3 IMPLANT

## 2016-08-01 NOTE — Interval H&P Note (Signed)
ICD Criteria  Current LVEF:25%. Within 12 months prior to implant: No   Heart failure history: Yes, Class III  Cardiomyopathy history: Yes, Non-Ischemic Cardiomyopathy.  Atrial Fibrillation/Atrial Flutter: No.  Ventricular tachycardia history: No.  Cardiac arrest history: No.  History of syndromes with risk of sudden death: No.  Previous ICD: Yes, Reason for ICD:  Primary prevention.  Current ICD indication: Primary  PPM indication: No.   Class I or II Bradycardia indication present: No  Beta Blocker therapy for 3 or more months: Yes, prescribed.   Ace Inhibitor/ARB therapy for 3 or more months: Yes, prescribed.   History and Physical Interval Note:  08/01/2016 8:04 AM  Deborah [name]  has presented today for surgery, with the diagnosis of cm - eri  The various methods of treatment have been discussed with the patient and family. After consideration of risks, benefits and other options for treatment, the patient has consented to  Procedure(s): ICD [employer] (N/A) as a surgical intervention .  The patient's history has been reviewed, patient examined, no change in status, stable for surgery.  I have reviewed the patient's chart and labs.  Questions were answered to the patient's satisfaction.     Sherryl Manges

## 2016-08-01 NOTE — H&P (View-Only) (Signed)
    Patient Care Team: Ajith Ramachandran, MD as PCP - General (Internal Medicine) George Kilpatrick, MD (Pulmonary Disease) Daniel R Bensimhon, MD (Cardiology) Mackena Plummer C Braylyn Kalter, MD (Cardiology)   HPI  Deborah [name] is a 71 y.o. female Seen in followup for ICD implantation for primary prevention with a history of congestive heart failure, secondary to nonischemic cardiomyopathy.   She is s/p mechanical MVR St. Jude valve in 2005.  Her ICD has reached ERI    She was quite discombobulated by the vibratory alert; she is complaining that her breathing is worse. She's not had any edema.  She's just been started on Entresto.  Echo 12/14 ejection fraction around 25%  Echo 8/16  EF 25%    Past Medical History:  Diagnosis Date  . Aortic stenosis, mild    mean gradient 10mmHg echo 09/09  . CHF (congestive heart failure) (HCC)    --Non-ischemic CM EF 30-35% by echo 9/09 (Previous 25%)  --s/p St. Jude single chamber ICD 04/09 --Minimal  CAD by cath 2004 LAD 20% LCX 20% Ramus 30% RCA nl --echo 05/11 EF 20-25%  . Diabetes mellitus    type 2  . Hyperlipidemia   . Hypertension   . Kidney stones   . MR (congenital mitral regurgitation)    Severe MR s/p St. Jude mechanical MVR by Don Glower 2005    Past Surgical History:  Procedure Laterality Date  . APPENDECTOMY  1979  . CARDIAC DEFIBRILLATOR PLACEMENT  2009  . CARDIAC VALVE SURGERY  2005  . ESOPHAGOGASTRODUODENOSCOPY N/A 12/05/2014   Procedure: ESOPHAGOGASTRODUODENOSCOPY (EGD);  Surgeon: John Hayes, MD;  Location: [facility] ENDOSCOPY;  Service: Endoscopy;  Laterality: N/A;  . INSERT / REPLACE / REMOVE PACEMAKER      Current Outpatient Prescriptions  Medication Sig Dispense Refill  . atorvastatin (LIPITOR) 40 MG tablet Take 1 tablet (40 mg total) by mouth daily. 90 tablet 1  . carvedilol (COREG) 25 MG tablet TAKE 1 TABLET TWICE DAILY WITH MEALS 180 tablet 1  . Choline Fenofibrate (TRILIPIX) 135 MG capsule Take 1 capsule (135 mg total) by  mouth daily. 90 capsule 1  . digoxin (LANOXIN) 0.125 MG tablet Take 0.125 mg by mouth daily.    . HYDROcodone-acetaminophen (VICODIN) 5-500 MG per tablet Take 1 tablet by mouth every 8 (eight) hours as needed for pain.     . metFORMIN (GLUCOPHAGE) 1000 MG tablet Take 1,000 mg by mouth 2 (two) times daily with a meal.     . ondansetron (ZOFRAN) 4 MG tablet Take 1 tablet (4 mg total) by mouth every 6 (six) hours as needed for nausea. 20 tablet 0  . pantoprazole (PROTONIX) 40 MG tablet Take 1 tablet (40 mg total) by mouth 2 (two) times daily. 60 tablet 1  . sacubitril-valsartan (ENTRESTO) 24-26 MG Take 1 tablet by mouth 2 (two) times daily. 60 tablet 3  . warfarin (COUMADIN) 5 MG tablet Take as directed by coumadin clinic 150 tablet 3   No current facility-administered medications for this visit.     Allergies  Allergen Reactions  . Strawberry Extract Rash    Rash & itching.    Review of Systems negative except from HPI and PMH  Physical Exam BP 136/80   Pulse 80   Ht 5' 3" (1.6 m)   Wt 174 lb (78.9 kg)   SpO2 98%   BMI 30.82 kg/m  Well developed and well nourished in no acute distress HENT normal E scleral and icterus clear Neck Supple   JVP8; carotids brisk and full Clear to ausculation Regular rate and rhythm, mechanical S1 and 2/6 murmur  Soft with active bowel sounds No clubbing cyanosis  Edema Alert and oriented, grossly normal motor and sensory function Skin Warm and Dry  ECG personally reviewed  Sinus at 72 16/13/40 LVH with QRS widening and repolarization  poor R-wave progression  left axis deviation     Assessment and  Plan  Valve replacement-mitral-mechanical  Nonischemic cardiomyopathy  Hypertension  HFrEF chronic class IIb-3   Implantable defibrillator-St. Jude   Defibrillator is working appropriately. It has reached ERI   We have reviewed the benefits and risks of generator replacement.  These include but are not limited to lead fracture and  infection.  The patient understands, agrees and is willing to proceed.     Blood pressure well controlled  With her worsening shortness of breath and her mild JVD, I will begin her on furosemide 40 mg every other day.  Most recent potassium 4.5 creatinine 1.01 and digoxin level 0.5  s         

## 2016-08-01 NOTE — Progress Notes (Signed)
Her K llevels are modestly elevated at 5.2 and 5.2   These are within the range of the PARADIGM trial which classified K>5.5 as hyperkalemia  Will continue entresto and follow

## 2016-08-01 NOTE — Discharge Instructions (Signed)
Pacemaker Battery Change, Care After This sheet gives you information about how to care for yourself after your procedure. Your health care provider may also give you more specific instructions. If you have problems or questions, contact your health care provider. What can I expect after the procedure? After your procedure, it is common to have:  Pain or soreness at the site where the pacemaker was inserted.  Swelling at the site where the pacemaker was inserted. Follow these instructions at home: NO DRIVING FOR 4 DAYS  Incision care   Keep the incision clean and dry.  May shower in 24 hours; do not sit in water until site heals  You may shower the day after your procedure, or as directed by your health care provider.  Pat the area dry with a clean towel. Do not rub the area. This may cause bleeding.  Follow instructions from your health care provider about how to take care of your incision. Make sure you:  Wash your hands with soap and water before you change your bandage (dressing). If soap and water are not available, use hand sanitizer.  Change your dressing as told by your health care provider.  Leave skin glue in place. Dermabond will come off in next 10-14 days; if not will remove at wound check  Check your incision area every day for signs of infection. Check for:  More redness, swelling, or pain.  More fluid or blood.  Warmth.  Pus or a bad smell. Activity   Do not lift anything that is heavier than 10 lb (4.5 kg) until your health care provider says it is okay to do so.  For the first 2 weeks, or as long as told by your health care provider:  Avoid lifting your left arm higher than your shoulder.  Be gentle when you move your arms over your head. It is okay to raise your arm to comb your hair.  Avoid strenuous exercise.  Ask your health care provider when it is okay to:  Resume your normal activities.  Return to work or school.  Resume sexual  activity. Eating and drinking   Eat a heart-healthy diet. This should include plenty of fresh fruits and vegetables, whole grains, low-fat dairy products, and lean protein like chicken and fish.  Limit alcohol intake to no more than 1 drink a day for non-pregnant women and 2 drinks a day for men. One drink equals 12 oz of beer, 5 oz of wine, or 1 oz of hard liquor.  Check ingredients and nutrition facts on packaged foods and beverages. Avoid the following types of food:  Food that is high in salt (sodium).  Food that is high in saturated fat, like full-fat dairy or red meat.  Food that is high in trans fat, like fried food.  Food and drinks that are high in sugar. Lifestyle   Do not use any products that contain nicotine or tobacco, such as cigarettes and e-cigarettes. If you need help quitting, ask your health care provider.  Take steps to manage and control your weight.  Get regular exercise. Aim for 150 minutes of moderate-intensity exercise (such as walking or yoga) or 75 minutes of vigorous exercise (such as running or swimming) each week.  Manage other health problems, such as diabetes or high blood pressure. Ask your health care provider how you can manage these conditions. General instructions   Do not drive for 24 hours after your procedure if you were given a medicine to help you  relax (sedative).  Take over-the-counter and prescription medicines only as told by your health care provider.  Avoid putting pressure on the area where the pacemaker was placed.  If you need an MRI after your pacemaker has been placed, be sure to tell the health care provider who orders the MRI that you have a pacemaker.  Avoid close and prolonged exposure to electrical devices that have strong magnetic fields. These include:  Cell phones. Avoid keeping them in a pocket near the pacemaker, and try using the ear opposite the pacemaker.  MP3 players.  Household appliances, like  microwaves.  Metal detectors.  Electric generators.  High-tension wires.  Keep all follow-up visits as directed by your health care provider. This is important. Contact a health care provider if:  You have pain at the incision site that is not relieved by over-the-counter or prescription medicines.  You have any of these around your incision site or coming from it:  More redness, swelling, or pain.  Fluid or blood.  Warmth to the touch.  Pus or a bad smell.  You have a fever.  You feel brief, occasional palpitations, light-headedness, or any symptoms that you think might be related to your heart. Get help right away if:  You experience chest pain that is different from the pain at the pacemaker site.  You develop a red streak that extends above or below the incision site.  You experience shortness of breath.  You have palpitations or an irregular heartbeat.  You have light-headedness that does not go away quickly.  You faint or have dizzy spells.  Your pulse suddenly drops or increases rapidly and does not return to normal.  You begin to gain weight and your legs and ankles swell. Summary  After your procedure, it is common to have pain, soreness, and some swelling where the pacemaker was inserted.  Make sure to keep your incision clean and dry. Follow instructions from your health care provider about how to take care of your incision.  Check your incision every day for signs of infection, such as more pain or swelling, pus or a bad smell, warmth, or leaking fluid and blood.  Avoid strenuous exercise and lifting your left arm higher than your shoulder for 2 weeks, or as long as told by your health care provider. This information is not intended to replace advice given to you by your health care provider. Make sure you discuss any questions you have with your health care provider. Document Released: 01/16/2013 Document Revised: 02/18/2016 Document Reviewed:  02/18/2016 Elsevier Interactive Patient Education  2017 [employer].

## 2016-08-04 ENCOUNTER — Other Ambulatory Visit: Payer: Self-pay

## 2016-08-05 ENCOUNTER — Other Ambulatory Visit (HOSPITAL_COMMUNITY): Payer: Self-pay | Admitting: Cardiology

## 2016-08-05 MED ORDER — DIGOXIN 62.5 MCG PO TABS: 1.0000 | ORAL_TABLET | Freq: Every day | ORAL | 3 refills | Status: DC

## 2016-08-08 ENCOUNTER — Ambulatory Visit (INDEPENDENT_AMBULATORY_CARE_PROVIDER_SITE_OTHER): Payer: Medicare HMO | Admitting: *Deleted

## 2016-08-08 DIAGNOSIS — Z5181 Encounter for therapeutic drug level monitoring: Secondary | ICD-10-CM

## 2016-08-08 DIAGNOSIS — I059 Rheumatic mitral valve disease, unspecified: Secondary | ICD-10-CM

## 2016-08-08 LAB — POCT INR: INR: 3.4

## 2016-08-09 ENCOUNTER — Telehealth: Payer: Self-pay | Admitting: Internal Medicine

## 2016-08-09 MED ORDER — DIGOXIN 62.5 MCG PO TABS: 1.0000 | ORAL_TABLET | Freq: Every day | ORAL | 0 refills | Status: DC

## 2016-08-09 NOTE — Telephone Encounter (Signed)
I called and spoke with the patient and advised her that we do not prescribe antibiotics for colds.  She states she had a productive cough last night and was vomiting. I advised her she will need to call her PCP to see if they need to see her and evaluate her cough further. She voices understanding.  She also ran out of her digoxin and needs a refill on this.  I advised her Dr. Alford Highland office sent this to [facility] pharmacy on 08/05/16 and this should be coming to her. I will [facility] in a local [facility].

## 2016-08-09 NOTE — Telephone Encounter (Signed)
Pt stated Dr Graciela Husbands was suppose to write her a prescription for antibiotic for a cold when she was in the hospital getting her battery replaced. Pt use the [facility]. Please call pt.

## 2016-08-09 NOTE — Telephone Encounter (Signed)
Attempted to call the patient- busy x 2 attempts- will call back.

## 2016-08-10 ENCOUNTER — Ambulatory Visit (HOSPITAL_COMMUNITY)
Admission: RE | Admit: 2016-08-10 | Discharge: 2016-08-10 | Disposition: A | Discharge status: Home / Self Care | Payer: Medicare HMO | Source: Ambulatory Visit | Attending: Cardiology | Admitting: Cardiology

## 2016-08-10 ENCOUNTER — Encounter (HOSPITAL_COMMUNITY): Payer: Medicare HMO | Admitting: Internal Medicine

## 2016-08-10 DIAGNOSIS — I5022 Chronic systolic (congestive) heart failure: Secondary | ICD-10-CM | POA: Diagnosis not present

## 2016-08-10 LAB — ECHOCARDIOGRAM COMPLETE
AVLVOTPG: 4 mmHg
Area-P 1/2: 2.22 cm2
CHL CUP RV SYS PRESS: 35 mmHg
EWDT: 280 ms
FS: 13 % — AB (ref 28–44)
IVS/LV PW RATIO, ED: 0.8
LA diam end sys: 42 mm
LA diam index: 2.36 cm/m2
LA vol A4C: 104 ml
LA vol index: 65.7 mL/m2
LA vol: 117 mL
LASIZE: 42 mm
LV PW d: 9.81 mm — AB (ref 0.6–1.1)
LV SIMPSON'S DISK: 27
LV dias vol index: 85 mL/m2
LV dias vol: 152 mL — AB (ref 46–106)
LVOT SV: 50 mL
LVOT VTI: 21.9 cm
LVOT area: 2.27 cm2
LVOT diameter: 17 mm
LVOT peak vel: 104 cm/s
LVSYSVOL: 111 mL — AB (ref 14–42)
LVSYSVOLIN: 62 mL/m2
MV Dec: 280
MV Peak grad: 9 mmHg
MV pk A vel: 102 m/s
MV pk E vel: 146 m/s
MVSPHT: 82 ms
RV LATERAL S' VELOCITY: 11.7 cm/s
Reg peak vel: 282 cm/s
Stroke v: 41 ml
TAPSE: 18.5 mm
TRMAXVEL: 282 cm/s

## 2016-08-10 NOTE — Progress Notes (Signed)
  Echocardiogram 2D Echocardiogram has been performed.  Delcie Roch 08/10/2016, 10:05 AM

## 2016-08-15 ENCOUNTER — Ambulatory Visit (INDEPENDENT_AMBULATORY_CARE_PROVIDER_SITE_OTHER): Payer: Medicare HMO | Admitting: *Deleted

## 2016-08-15 ENCOUNTER — Telehealth: Payer: Self-pay | Admitting: *Deleted

## 2016-08-15 DIAGNOSIS — I5022 Chronic systolic (congestive) heart failure: Secondary | ICD-10-CM | POA: Diagnosis not present

## 2016-08-15 LAB — CUP PACEART INCLINIC DEVICE CHECK
Brady Statistic RV Percent Paced: 0 %
HighPow Impedance: 38.7147
Implantable Lead Implant Date: 20090410
Implantable Lead Location: 753860
Lead Channel Setting Pacing Amplitude: 2.5 V
Lead Channel Setting Pacing Pulse Width: 0.5 ms
Lead Channel Setting Sensing Sensitivity: 0.5 mV
MDC IDC MSMT LEADCHNL RV IMPEDANCE VALUE: 437.5 Ohm
MDC IDC MSMT LEADCHNL RV PACING THRESHOLD AMPLITUDE: 0.75 V
MDC IDC MSMT LEADCHNL RV PACING THRESHOLD PULSEWIDTH: 0.5 ms
MDC IDC MSMT LEADCHNL RV SENSING INTR AMPL: 11.7 mV
MDC IDC PG IMPLANT DT: 20180423
MDC IDC SESS DTM: 20180507155516
Pulse Gen Serial Number: 7423042

## 2016-08-15 NOTE — Progress Notes (Signed)
Wound check appointment s/p gen change. dermabond removed. Wound without redness or edema. Incision edges approximated, wound well healed. Normal device function. Thresholds, sensing, and impedances consistent with implant measurements. Device programmed at chronic outputs s/p gen change. Histogram distribution appropriate for patient and level of activity. No mode switches or ventricular arrhythmias noted. Patient educated about wound care, arm mobility, lifting restrictions, shock plan. ROV with SK 11/09/16.

## 2016-08-16 MED ORDER — DIGOXIN 62.5 MCG PO TABS: 1.0000 | ORAL_TABLET | Freq: Every day | ORAL | 3 refills | Status: DC

## 2016-08-16 NOTE — Telephone Encounter (Signed)
I am assuming that MF is Manufacturer, but there is no alternative to lanoxin- did you try to send it in as digoxin?

## 2016-08-16 NOTE — Telephone Encounter (Signed)
Fine to do a DAW as there is no alternative to this drug.  Thanks!

## 2016-08-22 ENCOUNTER — Encounter (HOSPITAL_COMMUNITY): Payer: Self-pay

## 2016-08-22 ENCOUNTER — Telehealth (HOSPITAL_COMMUNITY): Payer: Self-pay

## 2016-08-22 NOTE — Telephone Encounter (Signed)
ECHOCARDIOGRAM COMPLETE  Order: [id number]  Status:  Final result Visible to patient:  No (Not Released) Dx:  SYSTOLIC HEART FAILURE, CHRONIC  Notes recorded by Chyrl Civatte, RN on 08/22/2016 at 10:07 AM EDT Still unable to reach patient, letter mailed. ------  Notes recorded by Theresia Bough, CMA on 08/16/2016 at 9:38 AM EDT Unable to reach patient. NO ANSWER UNABLE TO LEAVE MESSAGE  [phone] ------  Notes recorded by Chyrl Civatte, RN on 08/12/2016 at 2:05 PM EDT No answer, left VM. ------  Notes recorded by Theresia Bough, CMA on 08/10/2016 at 5:00 PM EDT Unable to reach patient. [phone] No answer unable to leave message   ------  Notes recorded by Little Ishikawa, NP on 08/10/2016 at 4:18 PM EDT Will you please tell Deborah [name] that her heart function is slightly worse than her prior Echo. We would like to see her, she missed her appointment today, can you call her to reschedule?

## 2016-08-30 ENCOUNTER — Ambulatory Visit (INDEPENDENT_AMBULATORY_CARE_PROVIDER_SITE_OTHER): Payer: Medicare HMO | Admitting: *Deleted

## 2016-08-30 DIAGNOSIS — Z5181 Encounter for therapeutic drug level monitoring: Secondary | ICD-10-CM

## 2016-08-30 DIAGNOSIS — I059 Rheumatic mitral valve disease, unspecified: Secondary | ICD-10-CM | POA: Diagnosis not present

## 2016-08-30 LAB — POCT INR: INR: 2.9

## 2016-09-11 ENCOUNTER — Other Ambulatory Visit: Payer: Self-pay | Admitting: Internal Medicine

## 2016-09-14 ENCOUNTER — Other Ambulatory Visit: Payer: Self-pay | Admitting: Internal Medicine

## 2016-09-15 ENCOUNTER — Other Ambulatory Visit: Payer: Self-pay | Admitting: *Deleted

## 2016-09-15 MED ORDER — DIGOXIN 62.5 MCG PO TABS: 1.0000 | ORAL_TABLET | Freq: Every day | ORAL | 3 refills | Status: DC

## 2016-09-15 NOTE — Telephone Encounter (Signed)
Patient has refills with [facility], but [facility]. I have spoke with the patient and she stated that she never received it from [place] and would like the [facility]. Per phone note as below, the requested is not available so I will send in as it was ordered through [place].  Telephone   08/15/2016 [facility] [address] [place] J, CMA   Conversation  (Newest Message First)  Aug 16, 2016       9:58 AM  Jefferey Pica, RN routed this conversation to Wayne, Ruthy Dick, CMA  Jefferey Pica, RN     9:58 AM  Note    Fine to do a DAW as there is no alternative to this drug.  Thanks!    Carden, Ruthy Dick, CMA  to Jefferey Pica, RN     9:39 AM  Yes MF is manufacture & I cant choose DAW without Doctor consent. Please advise, thanks        9:30 AM  Jefferey Pica, RN routed this conversation to Gholson, Ruthy Dick, CMA  Jefferey Pica, RN      9:29 AM  Note    I am assuming that MF is Manufacturer, but there is no alternative to lanoxin- did you try to send it in as digoxin?    Aug 15, 2016  Carden, Ruthy Dick, [place]  to Jefferey Pica, RN      5:10 PM  Pharmacy sent a request for Lanoxin, we filled it 08/09/16 but the pharmacy sent message back saying that this was d/c'd by the MF, they are asking for a alternative, thanks

## 2016-09-16 DIAGNOSIS — Z5181 Encounter for therapeutic drug level monitoring: Secondary | ICD-10-CM | POA: Diagnosis not present

## 2016-09-16 DIAGNOSIS — G8929 Other chronic pain: Secondary | ICD-10-CM | POA: Diagnosis not present

## 2016-09-19 ENCOUNTER — Other Ambulatory Visit: Payer: Self-pay | Admitting: *Deleted

## 2016-09-19 MED ORDER — FUROSEMIDE 40 MG PO TABS: 40.0000 mg | ORAL_TABLET | ORAL | 2 refills | Status: DC

## 2016-09-19 MED ORDER — CARVEDILOL 25 MG PO TABS: 25.0000 mg | ORAL_TABLET | Freq: Two times a day (BID) | ORAL | 2 refills | Status: DC

## 2016-09-19 MED ORDER — DIGOXIN 62.5 MCG PO TABS: 1.0000 | ORAL_TABLET | Freq: Every day | ORAL | 2 refills | Status: DC

## 2016-09-21 ENCOUNTER — Ambulatory Visit (INDEPENDENT_AMBULATORY_CARE_PROVIDER_SITE_OTHER): Payer: Medicare Other | Admitting: *Deleted

## 2016-09-21 DIAGNOSIS — I059 Rheumatic mitral valve disease, unspecified: Secondary | ICD-10-CM

## 2016-09-21 DIAGNOSIS — Z5181 Encounter for therapeutic drug level monitoring: Secondary | ICD-10-CM | POA: Diagnosis not present

## 2016-09-21 LAB — POCT INR: INR: 3.5

## 2016-09-22 ENCOUNTER — Other Ambulatory Visit: Payer: Self-pay | Admitting: *Deleted

## 2016-09-22 MED ORDER — WARFARIN SODIUM 5 MG PO TABS: ORAL_TABLET | ORAL | 1 refills | Status: DC

## 2016-10-19 ENCOUNTER — Ambulatory Visit (INDEPENDENT_AMBULATORY_CARE_PROVIDER_SITE_OTHER): Payer: Medicare Other | Admitting: Pharmacist

## 2016-10-19 DIAGNOSIS — Z5181 Encounter for therapeutic drug level monitoring: Secondary | ICD-10-CM | POA: Diagnosis not present

## 2016-10-19 DIAGNOSIS — I059 Rheumatic mitral valve disease, unspecified: Secondary | ICD-10-CM

## 2016-10-19 LAB — POCT INR: INR: 3.2

## 2016-10-24 ENCOUNTER — Encounter: Payer: Self-pay | Admitting: Podiatry

## 2016-10-24 ENCOUNTER — Ambulatory Visit (INDEPENDENT_AMBULATORY_CARE_PROVIDER_SITE_OTHER): Payer: Medicare Other | Admitting: Podiatry

## 2016-10-24 VITALS — Ht 63.0 in | Wt 174.0 lb

## 2016-10-24 DIAGNOSIS — B351 Tinea unguium: Secondary | ICD-10-CM

## 2016-10-24 DIAGNOSIS — M79671 Pain in right foot: Secondary | ICD-10-CM

## 2016-10-24 DIAGNOSIS — M79672 Pain in left foot: Secondary | ICD-10-CM | POA: Diagnosis not present

## 2016-10-24 NOTE — Patient Instructions (Signed)
Seen for hypertrophic nails and corns. All nails and corns debrided. Return in 3 months or sooner if needed.

## 2016-10-24 NOTE — Progress Notes (Signed)
SUBJECTIVE: 71 y.o. year old female presents complaining of painful nails and calluses on both feet. Thick nails hurt to walk. Stated that she has been diabetic for over 20 years and yesterday's blood glucose was 98. Patient also request for diabetic shoes.  REVIEW OF SYSTEMS: Pertinent items noted in HPI and remainder of comprehensive ROS otherwise negative.  OBJECTIVE: DERMATOLOGIC EXAMINATION: Nails: Thick dystrophic nails x 10. Plantar callus under 5th MPJ bilateral. Dry peeling scaly skin plantar bilateral.  VASCULAR EXAMINATION OF LOWER LIMBS: All pedal pulses are palpable with normal pulsation.  Capillary Filling times within 3 seconds in all digits.  No edema or erythema noted. Temperature gradient from tibial crest to dorsum of foot is within normal bilateral.  NEUROLOGIC EXAMINATION OF THE LOWER LIMBS: Achilles DTR is present and within normal. All epicritic and tactile sensations grossly intact. Sharp and Dull discriminatory sensations at the plantar ball of hallux is intact bilateral.   MUSCULOSKELETAL EXAMINATION: Positive for lesser digital contracture bilateral.  Plantar flexed 5th Met bilateral.  ASSESSMENT: Onychomycosis x 10. Deformed metatarsal with plantar callus bilateral. Tinea pedis plantar bilateral. Type II diabetic under control.  PLAN: Reviewed clinical findings and available treatment options. All nails and calluses debrided. Patient is to get certified by PCP for her diabetic shoes.

## 2016-11-03 ENCOUNTER — Telehealth: Payer: Self-pay | Admitting: Internal Medicine

## 2016-11-03 NOTE — Telephone Encounter (Signed)
No lipid profile on her and no refills in quite some time- I assume her PCP has been following her lipids- would forward to PCP for refill.   Thanks!

## 2016-11-03 NOTE — Telephone Encounter (Signed)
Called [facility] pharmacy to inform them that the pt would have to refill with pt's PCP. Pharmacy verbalized understanding.

## 2016-11-03 NOTE — Telephone Encounter (Signed)
[facility] pharmacy requesting a refill on Atorvastatin. Would you like to refill this medication? Please advise

## 2016-11-04 ENCOUNTER — Telehealth: Payer: Self-pay | Admitting: Internal Medicine

## 2016-11-04 NOTE — Telephone Encounter (Signed)
F/u Message  Pt states she is returning a call. Please call back to discuss

## 2016-11-04 NOTE — Telephone Encounter (Signed)
Informed patient that her refill was sent back for pharmacy to send to her PCP since he gets her lab work done for lipids. Patient instructed to contact her PCP. Patient verbalized understanding.

## 2016-11-09 ENCOUNTER — Ambulatory Visit (INDEPENDENT_AMBULATORY_CARE_PROVIDER_SITE_OTHER): Payer: Medicare Other | Admitting: Internal Medicine

## 2016-11-09 ENCOUNTER — Encounter (INDEPENDENT_AMBULATORY_CARE_PROVIDER_SITE_OTHER): Payer: Self-pay

## 2016-11-09 ENCOUNTER — Ambulatory Visit (INDEPENDENT_AMBULATORY_CARE_PROVIDER_SITE_OTHER): Payer: Medicare Other | Admitting: *Deleted

## 2016-11-09 ENCOUNTER — Encounter: Payer: Self-pay | Admitting: Internal Medicine

## 2016-11-09 VITALS — BP 150/80 | HR 66 | Ht 63.0 in | Wt 168.0 lb

## 2016-11-09 DIAGNOSIS — Z5181 Encounter for therapeutic drug level monitoring: Secondary | ICD-10-CM

## 2016-11-09 DIAGNOSIS — I5022 Chronic systolic (congestive) heart failure: Secondary | ICD-10-CM | POA: Diagnosis not present

## 2016-11-09 DIAGNOSIS — I059 Rheumatic mitral valve disease, unspecified: Secondary | ICD-10-CM | POA: Diagnosis not present

## 2016-11-09 DIAGNOSIS — I428 Other cardiomyopathies: Secondary | ICD-10-CM | POA: Diagnosis not present

## 2016-11-09 DIAGNOSIS — Z95810 Presence of automatic (implantable) cardiac defibrillator: Secondary | ICD-10-CM

## 2016-11-09 LAB — CUP PACEART INCLINIC DEVICE CHECK
Date Time Interrogation Session: 20180801135712
HIGH POWER IMPEDANCE MEASURED VALUE: 42.2374
Implantable Lead Model: 7120
Lead Channel Impedance Value: 462.5 Ohm
Lead Channel Pacing Threshold Amplitude: 0.75 V
Lead Channel Pacing Threshold Pulse Width: 0.5 ms
Lead Channel Sensing Intrinsic Amplitude: 12 mV
Lead Channel Setting Pacing Amplitude: 2.5 V
Lead Channel Setting Pacing Pulse Width: 0.5 ms
MDC IDC LEAD IMPLANT DT: 20090410
MDC IDC LEAD LOCATION: 753860
MDC IDC MSMT BATTERY REMAINING LONGEVITY: 90 mo
MDC IDC PG IMPLANT DT: 20180423
MDC IDC SET LEADCHNL RV SENSING SENSITIVITY: 0.5 mV
MDC IDC STAT BRADY RV PERCENT PACED: 0.05 %
Pulse Gen Serial Number: 7423042

## 2016-11-09 LAB — POCT INR: INR: 2.8

## 2016-11-09 NOTE — Progress Notes (Signed)
Patient Care Team: Georgianne Fick, MD as PCP - General (Internal Medicine) Corine Shelter, MD (Pulmonary Disease) Bensimhon, Bevelyn Buckles, MD (Cardiology) Duke Salvia, MD (Cardiology)   HPI  Deborah [name] is a 71 y.o. female Seen in followup for ICD implantation for primary prevention with a history of congestive heart failure, secondary to nonischemic cardiomyopathy.   She is s/p mechanical MVR St. Jude valve in 2005.  Her ICD reached ERI and she underwent generator replacement 4/18   She'sbeen started on Wilder.  The patient denies chest pain, shortness of breath, nocturnal dyspnea, orthopnea or peripheral edema.  There have been no palpitations, lightheadedness or syncope.    Echo 12/14 ejection fraction around 25%  Echo 8/16  EF 25% Echo 5/18  EF 15%   CHF records reviewed   Past Medical History:  Diagnosis Date  . Aortic stenosis, mild    mean gradient echo 09/09  . CHF (congestive heart failure) (HCC)    --Non-ischemic CM EF 30-35% by echo 9/09 (Previous 25%)  --s/p St. Jude single chamber ICD 04/09 --Minimal  CAD by cath 2004 LAD 20% LCX 20% Ramus 30% RCA nl --echo 05/11 EF 20-25%  . Diabetes mellitus    type 2  . Hyperlipidemia   . Hypertension   . Kidney stones   . MR (congenital mitral regurgitation)    Severe MR s/p St. Jude mechanical MVR by Gasper Lloyd 2005    Past Surgical History:  Procedure Laterality Date  . APPENDECTOMY  1979  . CARDIAC DEFIBRILLATOR PLACEMENT  2009  . CARDIAC VALVE SURGERY  2005  . ESOPHAGOGASTRODUODENOSCOPY N/A 12/05/2014   Procedure: ESOPHAGOGASTRODUODENOSCOPY (EGD);  Surgeon: Dorena Cookey, MD;  Location: [facility] ENDOSCOPY;  Service: Endoscopy;  Laterality: N/A;  . ICD GENERATOR CHANGEOUT N/A 08/01/2016   Procedure: ICD Generator Changeout;  Surgeon: Duke Salvia, MD;  Location: [facility] INVASIVE CV LAB;  Service: Cardiovascular;  Laterality: N/A;  . INSERT / REPLACE / REMOVE PACEMAKER      Current Outpatient  Prescriptions  Medication Sig Dispense Refill  . atorvastatin (LIPITOR) 40 MG tablet Take 1 tablet (40 mg total) by mouth daily. 90 tablet 1  . carvedilol (COREG) 25 MG tablet Take 1 tablet (25 mg total) by mouth 2 (two) times daily with a meal. 180 tablet 2  . Choline Fenofibrate (TRILIPIX) 135 MG capsule Take 1 capsule (135 mg total) by mouth daily. 90 capsule 1  . Digoxin 62.5 MCG TABS Take 1 tablet by mouth daily. 90 tablet 2  . furosemide (LASIX) 40 MG tablet Take 1 tablet (40 mg total) by mouth every other day. 45 tablet 2  . HYDROcodone-acetaminophen (NORCO) 7.5-325 MG tablet Take 1 tablet by mouth 2 (two) times daily as needed for moderate pain.     . metFORMIN (GLUCOPHAGE) 500 MG tablet Take 500 mg by mouth daily.    . ondansetron (ZOFRAN) 4 MG tablet Take 1 tablet (4 mg total) by mouth every 6 (six) hours as needed for nausea. 20 tablet 0  . pantoprazole (PROTONIX) 40 MG tablet Take 1 tablet (40 mg total) by mouth 2 (two) times daily. (Patient taking differently: Take 40 mg by mouth daily as needed (stomach pain). ) 60 tablet 1  . sacubitril-valsartan (ENTRESTO) 24-26 MG Take 1 tablet by mouth 2 (two) times daily. 60 tablet 3  . temazepam (RESTORIL) 15 MG capsule Take 15 mg by mouth at bedtime as needed for sleep.    Marland Kitchen warfarin (COUMADIN) 5  MG tablet Take as directed by coumadin clinic 150 tablet 1   No current facility-administered medications for this visit.     Allergies  Allergen Reactions  . Strawberry Extract Rash    Rash & itching.    Review of Systems negative except from HPI and PMH  Physical Exam BP (!) 150/80   Pulse 66   Ht 5\' 3"  (1.6 m)   Wt 168 lb (76.2 kg)   SpO2 98%   BMI 29.76 kg/m  Well developed and nourished in no acute distress HENT normal Neck supple with JVP-flat Carotids brisk and full without bruits Clear Regular rate and rhythm, no murmurs or gallops Abd-soft with active BS without hepatomegaly No Clubbing cyanosis edema Skin-warm and  dry A & Oriented  Grossly normal sensory and motor function   ECG personally reviewed  NSR 58 15/14/43 Axis -43 PVC rare    Assessment and  Plan  Valve replacement-mitral-mechanical  Nonischemic cardiomyopathy  Hypertension  HFrEF chronic class IIb-3   Implantable defibrillator-St. Jude    Euvolemic continue current meds  BP still elevated But is reluctant to increase her Entresto. Maybe we will have better luck when she sees Dr. Dorthea Cove and heart failure clinic. They have  been unable to make an appointment so we are arranging here.    With her mechanical mitral valve I asked her to add low-dose aspirin to her warfarin.  The patient's device was interrogated.  The information was reviewed. No changes were made in the programming.

## 2016-11-09 NOTE — Patient Instructions (Signed)
Medication Instructions: - Your physician recommends that you continue on your current medications as directed. Please refer to the Current Medication list given to you today.  Labwork: - none ordered  Procedures/Testing: - none ordered  Follow-Up: - Your physician recommends that you schedule a follow-up appointment in: 3 months with Francis Dowse, PA for Dr. Graciela Husbands.    Any Additional Special Instructions Will Be Listed Below (If Applicable).     If you need a refill on your cardiac medications before your next appointment, please call your pharmacy.

## 2016-12-02 DIAGNOSIS — I10 Essential (primary) hypertension: Secondary | ICD-10-CM | POA: Diagnosis not present

## 2016-12-02 DIAGNOSIS — E782 Mixed hyperlipidemia: Secondary | ICD-10-CM | POA: Diagnosis not present

## 2016-12-02 DIAGNOSIS — D649 Anemia, unspecified: Secondary | ICD-10-CM | POA: Diagnosis not present

## 2016-12-02 DIAGNOSIS — Z0000 Encounter for general adult medical examination without abnormal findings: Secondary | ICD-10-CM | POA: Diagnosis not present

## 2016-12-02 DIAGNOSIS — E1165 Type 2 diabetes mellitus with hyperglycemia: Secondary | ICD-10-CM | POA: Diagnosis not present

## 2016-12-02 DIAGNOSIS — M5432 Sciatica, left side: Secondary | ICD-10-CM | POA: Diagnosis not present

## 2016-12-02 DIAGNOSIS — D509 Iron deficiency anemia, unspecified: Secondary | ICD-10-CM | POA: Diagnosis not present

## 2016-12-07 ENCOUNTER — Ambulatory Visit (INDEPENDENT_AMBULATORY_CARE_PROVIDER_SITE_OTHER): Payer: Medicare Other | Admitting: *Deleted

## 2016-12-07 DIAGNOSIS — I059 Rheumatic mitral valve disease, unspecified: Secondary | ICD-10-CM | POA: Diagnosis not present

## 2016-12-07 DIAGNOSIS — Z5181 Encounter for therapeutic drug level monitoring: Secondary | ICD-10-CM | POA: Diagnosis not present

## 2016-12-07 LAB — POCT INR: INR: 2.9

## 2016-12-16 DIAGNOSIS — E782 Mixed hyperlipidemia: Secondary | ICD-10-CM | POA: Diagnosis not present

## 2016-12-16 DIAGNOSIS — I429 Cardiomyopathy, unspecified: Secondary | ICD-10-CM | POA: Diagnosis not present

## 2016-12-16 DIAGNOSIS — E1165 Type 2 diabetes mellitus with hyperglycemia: Secondary | ICD-10-CM | POA: Diagnosis not present

## 2016-12-16 DIAGNOSIS — I2510 Atherosclerotic heart disease of native coronary artery without angina pectoris: Secondary | ICD-10-CM | POA: Diagnosis not present

## 2016-12-28 ENCOUNTER — Ambulatory Visit (HOSPITAL_COMMUNITY)
Admission: RE | Admit: 2016-12-28 | Discharge: 2016-12-28 | Disposition: A | Discharge status: Home / Self Care | Payer: Medicare Other | Source: Ambulatory Visit | Attending: Internal Medicine | Admitting: Internal Medicine

## 2016-12-28 ENCOUNTER — Encounter (HOSPITAL_COMMUNITY): Payer: Self-pay | Admitting: Internal Medicine

## 2016-12-28 VITALS — BP 128/62 | HR 72 | Wt 169.0 lb

## 2016-12-28 DIAGNOSIS — Z952 Presence of prosthetic heart valve: Secondary | ICD-10-CM | POA: Diagnosis not present

## 2016-12-28 DIAGNOSIS — E1122 Type 2 diabetes mellitus with diabetic chronic kidney disease: Secondary | ICD-10-CM | POA: Insufficient documentation

## 2016-12-28 DIAGNOSIS — Z7901 Long term (current) use of anticoagulants: Secondary | ICD-10-CM | POA: Insufficient documentation

## 2016-12-28 DIAGNOSIS — Z79899 Other long term (current) drug therapy: Secondary | ICD-10-CM | POA: Insufficient documentation

## 2016-12-28 DIAGNOSIS — I130 Hypertensive heart and chronic kidney disease with heart failure and stage 1 through stage 4 chronic kidney disease, or unspecified chronic kidney disease: Secondary | ICD-10-CM | POA: Diagnosis not present

## 2016-12-28 DIAGNOSIS — N183 Chronic kidney disease, stage 3 (moderate): Secondary | ICD-10-CM | POA: Insufficient documentation

## 2016-12-28 DIAGNOSIS — Z7982 Long term (current) use of aspirin: Secondary | ICD-10-CM | POA: Insufficient documentation

## 2016-12-28 DIAGNOSIS — I5022 Chronic systolic (congestive) heart failure: Secondary | ICD-10-CM | POA: Insufficient documentation

## 2016-12-28 DIAGNOSIS — I350 Nonrheumatic aortic (valve) stenosis: Secondary | ICD-10-CM | POA: Insufficient documentation

## 2016-12-28 DIAGNOSIS — I428 Other cardiomyopathies: Secondary | ICD-10-CM | POA: Insufficient documentation

## 2016-12-28 DIAGNOSIS — Z95810 Presence of automatic (implantable) cardiac defibrillator: Secondary | ICD-10-CM | POA: Diagnosis not present

## 2016-12-28 DIAGNOSIS — Z7984 Long term (current) use of oral hypoglycemic drugs: Secondary | ICD-10-CM | POA: Insufficient documentation

## 2016-12-28 DIAGNOSIS — Z87442 Personal history of urinary calculi: Secondary | ICD-10-CM | POA: Diagnosis not present

## 2016-12-28 DIAGNOSIS — E785 Hyperlipidemia, unspecified: Secondary | ICD-10-CM | POA: Insufficient documentation

## 2016-12-28 LAB — BASIC METABOLIC PANEL
ANION GAP: 6 (ref 5–15)
BUN: 14 mg/dL (ref 6–20)
CALCIUM: 9.1 mg/dL (ref 8.9–10.3)
CO2: 27 mmol/L (ref 22–32)
CREATININE: 1.3 mg/dL — AB (ref 0.44–1.00)
Chloride: 108 mmol/L (ref 101–111)
GFR, EST AFRICAN AMERICAN: 46 mL/min — AB (ref >60)
GFR, EST NON AFRICAN AMERICAN: 40 mL/min — AB (ref >60)
Glucose, Bld: 89 mg/dL (ref 65–99)
Potassium: 4.5 mmol/L (ref 3.5–5.1)
SODIUM: 141 mmol/L (ref 135–145)

## 2016-12-28 LAB — DIGOXIN LEVEL: Digoxin Level: 0.4 ng/mL — ABNORMAL LOW (ref 0.8–2.0)

## 2016-12-28 MED ORDER — SACUBITRIL-VALSARTAN 49-51 MG PO TABS: 1.0000 | ORAL_TABLET | Freq: Two times a day (BID) | ORAL | 6 refills | Status: DC

## 2016-12-28 NOTE — Patient Instructions (Signed)
Routine lab work today. Will notify you of abnormal results, otherwise no news is good news!  INCREASE Entresto to 49/51 mg twice daily. May double up on current 24/26 mg tablets you have at home (Take 2 tabs twice daily). New Rx has been sent to your pharmacy for 49/51 mg tablets (Take 1 tab twice daily).  Follow up 4 months with Dr. Gala Romney. We will call you closer to this time, or you may call our office to schedule 1 month before you are due to be seen. Take all medication as prescribed the day of your appointment. Bring all medications with you to your appointment.  Do the following things EVERYDAY: 1) Weigh yourself in the morning before breakfast. Write it down and keep it in a log. 2) Take your medicines as prescribed 3) Eat low salt foods-Limit salt (sodium) to 2000 mg per day.  4) Stay as active as you can everyday 5) Limit all fluids for the day to less than 2 liters

## 2016-12-28 NOTE — Progress Notes (Signed)
[facility]   Patient ID: Deborah [name], female   DOB: September 25, 1945, 71 y.o.   MRN: [id number] PCP: Petra Kuba HF MD:     HPI: Deborah [name] is a very pleasant 71 year old woman with a history of congestive heart failure secondary to nonischemic cardiomyopathy, ejection fraction 25% s/p St Jude single chamber ICD.  She also has a history of mitral valve disease and status post mitral valve replacement with a St. Jude valve in 2005.  Remainder of her history is notable for diabetes, hypertension, hyperlipidemia, and kidney stones.   CPX performed June 2011 pVO2 16.8 slope 26.1 RER 1.11 O2 pulse 90% predicted O2 sat 87% at peak with possible hypoventilation.  05/12/11 ECHO 20-25%.  Mechanical MVR appears normal.   03/19/13 Echo 25% Mechanical MVR appears normal.   12/04/14 Echo EF 20-25% 08/10/16 EF 20% MVR ok   She returns today for HF follow up with her daughter.Recently had generator change with Dr. Graciela Husbands. Also started on Entresto. Feeling much better. Able to do all ADLs and go to store without difficulty. Daughter says she can walk a long way without stopping. No edema, orthopnea or PND. No bleeding with coumadin.   No ICD shocks  ROS: All systems negative except as listed in HPI, PMH and Problem List.  Past Medical History:  Diagnosis Date  . Aortic stenosis, mild    mean gradient echo 09/09  . CHF (congestive heart failure) (HCC)    --Non-ischemic CM EF 30-35% by echo 9/09 (Previous 25%)  --s/p St. Jude single chamber ICD 04/09 --Minimal  CAD by cath 2004 LAD 20% LCX 20% Ramus 30% RCA nl --echo 05/11 EF 20-25%  . Diabetes mellitus    type 2  . Hyperlipidemia   . Hypertension   . Kidney stones   . MR (congenital mitral regurgitation)    Severe MR s/p St. Jude mechanical MVR by Gasper [name] 2005    Current Outpatient Prescriptions  Medication Sig Dispense Refill  . aspirin EC 81 MG tablet Take 81 mg by mouth daily.    Marland Kitchen atorvastatin (LIPITOR) 40 MG tablet Take 1 tablet  (40 mg total) by mouth daily. 90 tablet 1  . carvedilol (COREG) 25 MG tablet Take 1 tablet (25 mg total) by mouth 2 (two) times daily with a meal. 180 tablet 2  . Choline Fenofibrate (TRILIPIX) 135 MG capsule Take 1 capsule (135 mg total) by mouth daily. 90 capsule 1  . Digoxin 62.5 MCG TABS Take 1 tablet by mouth daily. 90 tablet 2  . furosemide (LASIX) 40 MG tablet Take 1 tablet (40 mg total) by mouth every other day. 45 tablet 2  . HYDROcodone-acetaminophen (NORCO) 7.5-325 MG tablet Take 1 tablet by mouth 2 (two) times daily as needed for moderate pain.     . metFORMIN (GLUCOPHAGE) 500 MG tablet Take 500 mg by mouth daily.    . ondansetron (ZOFRAN) 4 MG tablet Take 1 tablet (4 mg total) by mouth every 6 (six) hours as needed for nausea. 20 tablet 0  . pantoprazole (PROTONIX) 40 MG tablet Take 1 tablet (40 mg total) by mouth 2 (two) times daily. 60 tablet 1  . sacubitril-valsartan (ENTRESTO) 24-26 MG Take 1 tablet by mouth 2 (two) times daily. 60 tablet 3  . temazepam (RESTORIL) 15 MG capsule Take 15 mg by mouth at bedtime as needed for sleep.    Marland Kitchen warfarin (COUMADIN) 5 MG tablet Take as directed by coumadin clinic 150 tablet 1  No current facility-administered medications for this encounter.      PHYSICAL EXAM: Vitals:   12/28/16 1158  BP: 128/62  Pulse: 72  SpO2: 100%  Weight: 169 lb (76.7 kg)   General:  Well appearing. No resp difficulty HEENT: normal Neck: supple. JVP 5-6  Carotids 2+ bilat; no bruits. No lymphadenopathy or thryomegaly appreciated. Cor: PMI laterally displaced. Regular rate & rhythm. Mechanical s1 Lungs: clear Abdomen: soft, nontender, nondistended. No hepatosplenomegaly. No bruits or masses. Good bowel sounds. Extremities: no cyanosis, clubbing, rash, edema Neuro: alert & orientedx3, cranial nerves grossly intact. moves all 4 extremities w/o difficulty. Affect pleasant   ASSESSMENT & PLAN:  1. Chronic Systolic Heart Failure, NICM EF 20-25% in 11/2014, St.  Jude ICD NYHA III.  - Echo reviewed personally from 5/18. EF 20% - Improved today. NYHA II. Volume status looks good.  - IncreaseEntresto to 49/51mg  BID - Continue goal dose Coreg  BID.  - Continue digoxin, get dig level and BMET today.  - Continue to follow closely. Refuses CR referral  2. S/P MVR - on coumadin - INR followed by [employer] [facility]. Office. Goal 2.5-3.0 Continue ASA 81 - Discussed SBE prophylaxis 3. HTN - Blood pressure well controlled. Continue current regimen. 4. CKD stage III: - Will get BMET today 5. NICM s/p St. Jude ICD.  - s/p recent generator change with Dr. [place] Bing, Reuel Boom, MD 12:23 PM

## 2017-01-11 ENCOUNTER — Ambulatory Visit (INDEPENDENT_AMBULATORY_CARE_PROVIDER_SITE_OTHER): Payer: Medicare Other | Admitting: *Deleted

## 2017-01-11 DIAGNOSIS — Z5181 Encounter for therapeutic drug level monitoring: Secondary | ICD-10-CM | POA: Diagnosis not present

## 2017-01-11 DIAGNOSIS — I059 Rheumatic mitral valve disease, unspecified: Secondary | ICD-10-CM | POA: Diagnosis not present

## 2017-01-11 LAB — POCT INR: INR: 2.1

## 2017-01-24 ENCOUNTER — Ambulatory Visit: Payer: Medicare Other | Admitting: Podiatry

## 2017-01-26 ENCOUNTER — Ambulatory Visit (INDEPENDENT_AMBULATORY_CARE_PROVIDER_SITE_OTHER): Payer: Medicare Other | Admitting: Physician Assistant

## 2017-01-26 VITALS — BP 124/68 | HR 66 | Ht 63.0 in | Wt 173.0 lb

## 2017-01-26 DIAGNOSIS — I428 Other cardiomyopathies: Secondary | ICD-10-CM

## 2017-01-26 DIAGNOSIS — I5022 Chronic systolic (congestive) heart failure: Secondary | ICD-10-CM

## 2017-01-26 DIAGNOSIS — Z95810 Presence of automatic (implantable) cardiac defibrillator: Secondary | ICD-10-CM | POA: Diagnosis not present

## 2017-01-26 DIAGNOSIS — I10 Essential (primary) hypertension: Secondary | ICD-10-CM

## 2017-01-26 DIAGNOSIS — Z952 Presence of prosthetic heart valve: Secondary | ICD-10-CM

## 2017-01-26 NOTE — Patient Instructions (Signed)
Medication Instructions:   Your physician recommends that you continue on your current medications as directed. Please refer to the Current Medication list given to you today.    If you need a refill on your cardiac medications before your next appointment, please call your pharmacy.  Labwork: NONE ORDERED  TODAY    Testing/Procedures: NONE ORDERED  TODAY    Follow-Up: Your physician wants you to follow-up in:  IN  6  MONTHS WITH DR Logan Bores will receive a reminder letter in the mail two months in advance. If you don't receive a letter, please call our office to schedule the follow-up appointment.    Remote monitoring is used to monitor your Pacemaker of ICD from home. This monitoring reduces the number of office visits required to check your device to one time per year. It allows [place] to keep an eye on the functioning of your device to ensure it is working properly. You are scheduled for a device check from home on . 05-01-16 You may send your transmission at any time that day. If you have a wireless device, the transmission will be sent automatically. After your physician reviews your transmission, you will receive a postcard with your next transmission date.     Any Other Special Instructions Will Be Listed Below (If Applicable).

## 2017-01-26 NOTE — Progress Notes (Signed)
Cardiology Office Note Date:  01/26/2017  Patient ID:  Deborah [name], DOB 05/10/1945, MRN [id number] PCP:  Georgianne Fickamachandran, Ajith, MD  Cardiologist:  Dr. Gala RomneyBensimhon Electrophysiologist: Dr. Graciela HusbandsKlein    Chief Complaint: planned 3 month f/u  History of Present Illness: Deborah ReichertJanice F [name] is a 71 y.o. female with history of NICM, chronic CHF, VHD w/MVR (mechanical), DM, HTN, HLD, renal calculi, CRI (III).  She comes today to be seen for Dr. Graciela HusbandsKlein.  Last seen by him in Aug, at that time doing well, discussed adding ASA 81mg  tx given MVR.  She comes today doing pretty well.  She denies any difficulties with her ADLs or walking on flat ground is interested in starting routine exercise regime.  No CP, palpitations, no dizziness, near syncope or syncope.  No symptoms of PND or orthopna.  She is weighing every day without significant or rapid gains.  No bleeding or signs of bleeding follows her coumadin with the coumadin clinic, reports her PMD manages lipids/routine labs   Device information: SJM single chamber ICD, implanted 07/20/07, gen change 08/01/16, Dr. Graciela HusbandsKlein   Past Medical History:  Diagnosis Date  . Aortic stenosis, mild    mean gradient 10mmHg echo 09/09  . CHF (congestive heart failure) (HCC)    --Non-ischemic CM EF 30-35% by echo 9/09 (Previous 25%)  --s/p St. Jude single chamber ICD 04/09 --Minimal  CAD by cath 2004 LAD 20% LCX 20% Ramus 30% RCA nl --echo 05/11 EF 20-25%  . Diabetes mellitus    type 2  . Hyperlipidemia   . Hypertension   . Kidney stones   . MR (congenital mitral regurgitation)    Severe MR s/p St. Jude mechanical MVR by Gasper Lloydon Glower 2005    Past Surgical History:  Procedure Laterality Date  . APPENDECTOMY  1979  . CARDIAC DEFIBRILLATOR PLACEMENT  2009  . CARDIAC VALVE SURGERY  2005  . ESOPHAGOGASTRODUODENOSCOPY N/A 12/05/2014   Procedure: ESOPHAGOGASTRODUODENOSCOPY (EGD);  Surgeon: Dorena CookeyJohn Hayes, MD;  [facility];  Service: Endoscopy;  Laterality: N/A;  .  ICD GENERATOR CHANGEOUT N/A 08/01/2016   Procedure: ICD Generator Changeout;  Surgeon: Duke SalviaSteven C Klein, MD;  [facility];  Service: Cardiovascular;  Laterality: N/A;  . INSERT / REPLACE / REMOVE PACEMAKER      Current Outpatient Prescriptions  Medication Sig Dispense Refill  . aspirin EC 81 MG tablet Take 81 mg by mouth daily.    Marland Kitchen. atorvastatin (LIPITOR) 40 MG tablet Take 1 tablet (40 mg total) by mouth daily. 90 tablet 1  . carvedilol (COREG) 25 MG tablet Take 1 tablet (25 mg total) by mouth 2 (two) times daily with a meal. 180 tablet 2  . Choline Fenofibrate (TRILIPIX) 135 MG capsule Take 1 capsule (135 mg total) by mouth daily. 90 capsule 1  . Digoxin 62.5 MCG TABS Take 1 tablet by mouth daily. 90 tablet 2  . furosemide (LASIX) 40 MG tablet Take 1 tablet (40 mg total) by mouth every other day. 45 tablet 2  . HYDROcodone-acetaminophen (NORCO) 7.5-325 MG tablet Take 1 tablet by mouth 2 (two) times daily as needed for moderate pain.     . metFORMIN (GLUCOPHAGE) 500 MG tablet Take 500 mg by mouth daily.    . ondansetron (ZOFRAN) 4 MG tablet Take 1 tablet (4 mg total) by mouth every 6 (six) hours as needed for nausea. 20 tablet 0  . pantoprazole (PROTONIX) 40 MG tablet Take 1 tablet (40 mg total) by mouth 2 (two)  times daily. 60 tablet 1  . sacubitril-valsartan (ENTRESTO) 49-51 MG Take 1 tablet by mouth 2 (two) times daily. 60 tablet 6  . temazepam (RESTORIL) 15 MG capsule Take 15 mg by mouth at bedtime as needed for sleep.    Marland Kitchen warfarin (COUMADIN) 5 MG tablet Take as directed by coumadin clinic 150 tablet 1   No current facility-administered medications for this visit.     Allergies:   Strawberry extract   Social History:  The patient  reports that she quit smoking about 32 years ago. She has never used smokeless tobacco. She reports that she does not drink alcohol or use drugs.   Family History:  The patient's family history includes Heart disease in her mother.  ROS:   Please see the history of present illness.  All other systems are reviewed and otherwise negative.   PHYSICAL EXAM:  VS:  There were no vitals taken for this visit. BMI: There is no height or weight on file to calculate BMI. Well nourished, well developed, in no acute distress  HEENT: normocephalic, atraumatic  Neck: no JVD, carotid bruits or masses Cardiac:  RRR; valve is apprciated, no rubs, or gallops Lungs:  CTA b/l, no wheezing, rhonchi or rales  Abd: soft, nontender MS: no deformity, age appropriate atrophy Ext: no edema  Skin: warm and dry, no rash Neuro:  No gross deficits appreciated Psych: euthymic mood, full affect  ICD site is stable, no tethering or discomfort   EKG:  Not done today ICD interrogation done today and reviewed by myself: battery and lead measurements are good, one NSVT, none otherwise, CorVue was no on, programmed on today.   CPX performed June 2011 pVO2 16.8 slope 26.1 RER 1.11 O2 pulse 90% predicted O2 sat 87% at peak with possible hypoventilation.  05/12/11 ECHO 20-25%.  Mechanical MVR appears normal.   03/19/13 Echo 25% Mechanical MVR appears normal.   12/04/14 Echo EF 20-25% 08/10/16 EF 20% MVR ok   Recent Labs: 07/25/2016: Hemoglobin 12.3; Platelets 271 12/28/2016: BUN 14; Creatinine, Ser 1.30; Potassium 4.5; Sodium 141  No results found for requested labs within last 8760 hours.   CrCl cannot be calculated (Patient's most recent lab result is older than the maximum 21 days allowed.).   Wt Readings from Last 3 Encounters:  12/28/16 169 lb (76.7 kg)  11/09/16 168 lb (76.2 kg)  10/24/16 174 lb (78.9 kg)     Other studies reviewed: Additional studies/records reviewed today include: summarized above  ASSESSMENT AND PLAN:  1. ICD     Intact function  2. NICM     On BB, Entresto, dig, diuretic     Follows with CHF team, Dr. Gala Romney  3. Chronic CHF     Weight looks stable, exam does not suggest fluid OL     CorVue was programmed on  today  4. HTN     Looks OK     No changes today   Disposition: F/u with Q 3 month remote device checks, 6 months in-clinic, sooner if needed  Current medicines are reviewed at length with the patient today.  The patient did not have any concerns regarding medicines.  Norma Fredrickson, PA-C 01/26/2017 11:51 AM     [facility] [address] [place] [address] [phone] (office)  [phone] (fax)

## 2017-01-27 ENCOUNTER — Encounter: Payer: Medicare Other | Admitting: Physician Assistant

## 2017-02-08 ENCOUNTER — Ambulatory Visit (INDEPENDENT_AMBULATORY_CARE_PROVIDER_SITE_OTHER): Payer: Medicare Other | Admitting: *Deleted

## 2017-02-08 DIAGNOSIS — I059 Rheumatic mitral valve disease, unspecified: Secondary | ICD-10-CM | POA: Diagnosis not present

## 2017-02-08 DIAGNOSIS — Z5181 Encounter for therapeutic drug level monitoring: Secondary | ICD-10-CM | POA: Diagnosis not present

## 2017-02-08 LAB — POCT INR: INR: 1.9

## 2017-02-15 ENCOUNTER — Telehealth: Payer: Self-pay | Admitting: *Deleted

## 2017-02-15 NOTE — Telephone Encounter (Signed)
Spoke with pt and she states she has been coughing and expectorating yellow mucous for  2 weeks Instructed that she needs to call PCP or go to Urgent Care as the yellow sputum is indicative of infection . Also instructed to call us with any medications changes or any new medications and she states understanding.

## 2017-02-16 ENCOUNTER — Telehealth: Payer: Self-pay | Admitting: Physician Assistant

## 2017-02-16 NOTE — Telephone Encounter (Signed)
Moderate DDI expected with cephalosporin and warfarin. Will see patient after 3 days after of antibiotic (appt on 11/9 at 3:15pm)

## 2017-02-16 NOTE — Telephone Encounter (Signed)
Patient calling, states that she is sick with a cold and was instructed to call with name of medication.  Patient is taking Cefuroxine Axetal 500 mg, 1 tablet 2x a day.  Please f/u

## 2017-02-17 ENCOUNTER — Ambulatory Visit (INDEPENDENT_AMBULATORY_CARE_PROVIDER_SITE_OTHER): Payer: Medicare HMO | Admitting: *Deleted

## 2017-02-17 DIAGNOSIS — Z5181 Encounter for therapeutic drug level monitoring: Secondary | ICD-10-CM | POA: Diagnosis not present

## 2017-02-17 DIAGNOSIS — I059 Rheumatic mitral valve disease, unspecified: Secondary | ICD-10-CM | POA: Diagnosis not present

## 2017-02-17 LAB — POCT INR: INR: 3.5

## 2017-02-17 NOTE — Progress Notes (Signed)
02/17/17-INR 3.5; Coumadin Clinic instructions: Continue taking 1.5 tablets daily except take 2 tablets on Mondays.  Recheck INR in 2 weeks.  Only eat 2 servings of leafy green each week.

## 2017-02-23 ENCOUNTER — Telehealth (HOSPITAL_COMMUNITY): Payer: Self-pay

## 2017-02-23 NOTE — Telephone Encounter (Signed)
Samples of this drug were given to the patient, quantity 28, Lot Number [id number], Exp 12/2018  Patient was accidentally doubling up on Entresto 49-51 mg tablets (2 tablets BID). Her insurance will not refill this prescription early and she will be out in 2 days. She was unsure what date the pharmacy can fill her prescription. I let her know we can provide 1 week of samples of the lower strength tablets and directed her to take 2 tablets 2 times daily. I also reminded her that once the pharmacy fills her prescription for the 49-51 mg tablets she only needs to take 1 tablet twice daily.

## 2017-03-07 ENCOUNTER — Ambulatory Visit (INDEPENDENT_AMBULATORY_CARE_PROVIDER_SITE_OTHER): Payer: Medicare HMO | Admitting: *Deleted

## 2017-03-07 DIAGNOSIS — Z5181 Encounter for therapeutic drug level monitoring: Secondary | ICD-10-CM

## 2017-03-07 DIAGNOSIS — I059 Rheumatic mitral valve disease, unspecified: Secondary | ICD-10-CM | POA: Diagnosis not present

## 2017-03-07 LAB — POCT INR: INR: 3.9

## 2017-03-07 NOTE — Patient Instructions (Signed)
Skip tomorrow's dose, then Continue taking 1.5 tablets daily except take 2 tablets on Mondays.  Recheck INR in 2 weeks.  Only eat 2 servings of leafy green each week. Call our office if you have any questions [phone]

## 2017-03-15 DIAGNOSIS — I2510 Atherosclerotic heart disease of native coronary artery without angina pectoris: Secondary | ICD-10-CM | POA: Diagnosis not present

## 2017-03-15 DIAGNOSIS — E782 Mixed hyperlipidemia: Secondary | ICD-10-CM | POA: Diagnosis not present

## 2017-03-15 DIAGNOSIS — E1165 Type 2 diabetes mellitus with hyperglycemia: Secondary | ICD-10-CM | POA: Diagnosis not present

## 2017-03-23 ENCOUNTER — Other Ambulatory Visit (HOSPITAL_COMMUNITY): Payer: Self-pay

## 2017-03-23 MED ORDER — CARVEDILOL 25 MG PO TABS: 25.0000 mg | ORAL_TABLET | Freq: Two times a day (BID) | ORAL | 2 refills | Status: DC

## 2017-03-23 MED ORDER — DIGOXIN 62.5 MCG PO TABS: 1.0000 | ORAL_TABLET | Freq: Every day | ORAL | 2 refills | Status: DC

## 2017-04-05 ENCOUNTER — Ambulatory Visit (INDEPENDENT_AMBULATORY_CARE_PROVIDER_SITE_OTHER): Payer: Medicare HMO | Admitting: *Deleted

## 2017-04-05 DIAGNOSIS — I059 Rheumatic mitral valve disease, unspecified: Secondary | ICD-10-CM | POA: Diagnosis not present

## 2017-04-05 DIAGNOSIS — Z5181 Encounter for therapeutic drug level monitoring: Secondary | ICD-10-CM | POA: Diagnosis not present

## 2017-04-05 LAB — POCT INR: INR: 3.4

## 2017-04-05 NOTE — Patient Instructions (Signed)
Description    Continue taking 1.5 tablets daily except take 2 tablets on Mondays.  Recheck INR in 3 weeks.  Only eat 2 servings of leafy green each week. Call our office if you have any questions [phone]

## 2017-04-14 DIAGNOSIS — E1165 Type 2 diabetes mellitus with hyperglycemia: Secondary | ICD-10-CM | POA: Diagnosis not present

## 2017-04-14 DIAGNOSIS — I429 Cardiomyopathy, unspecified: Secondary | ICD-10-CM | POA: Diagnosis not present

## 2017-04-14 DIAGNOSIS — I2510 Atherosclerotic heart disease of native coronary artery without angina pectoris: Secondary | ICD-10-CM | POA: Diagnosis not present

## 2017-04-14 DIAGNOSIS — E782 Mixed hyperlipidemia: Secondary | ICD-10-CM | POA: Diagnosis not present

## 2017-04-14 DIAGNOSIS — I10 Essential (primary) hypertension: Secondary | ICD-10-CM | POA: Diagnosis not present

## 2017-04-17 ENCOUNTER — Telehealth: Payer: Self-pay | Admitting: Internal Medicine

## 2017-04-17 NOTE — Telephone Encounter (Signed)
New message    Myrene Buddy from [facility] [phone] ext [phone] Fax [phone]  1) Are you calling to confirm a diagnosis or obtain personal health information (Y/N)? yes  2) If so, what information is requested? EF%  Please route to Medical Records or your medical records site representative

## 2017-04-18 NOTE — Telephone Encounter (Signed)
VM left with [facility] asking her to fax over EF% form before records can be faxed.

## 2017-04-21 ENCOUNTER — Telehealth: Payer: Self-pay | Admitting: Internal Medicine

## 2017-04-21 MED ORDER — WARFARIN SODIUM 5 MG PO TABS: ORAL_TABLET | ORAL | 1 refills | Status: DC

## 2017-04-21 NOTE — Telephone Encounter (Signed)
New message   Patient calling to request refill on warfarin (COUMADIN) 5 MG tablet. Please call  If Home Health RN is calling please get Coumadin Nurse on the phone STAT  1.  Are you calling in regards to an appointment? NO  2.  Are you calling for a refill ? YES  3.  Are you having bleeding issues? NO  4.  Do you need clearance to hold Coumadin? NO  Please route to the Coumadin Clinic Pool

## 2017-04-21 NOTE — Telephone Encounter (Signed)
RX sent. Pt aware. 

## 2017-04-26 ENCOUNTER — Ambulatory Visit (INDEPENDENT_AMBULATORY_CARE_PROVIDER_SITE_OTHER): Payer: Medicare Other | Admitting: *Deleted

## 2017-04-26 DIAGNOSIS — Z5181 Encounter for therapeutic drug level monitoring: Secondary | ICD-10-CM | POA: Diagnosis not present

## 2017-04-26 DIAGNOSIS — I059 Rheumatic mitral valve disease, unspecified: Secondary | ICD-10-CM

## 2017-04-26 LAB — POCT INR: INR: 5

## 2017-04-26 NOTE — Patient Instructions (Addendum)
  Description   Do not take any Coumadin tomorrow and No Coumadin Friday then continue taking 1.5 tablets daily except take 2 tablets on Mondays.  Recheck INR in 9 days.  Continue eating 2 servings of leafy green each week. Call our office if you have any questions [phone]

## 2017-05-01 ENCOUNTER — Telehealth: Payer: Self-pay | Admitting: Cardiology

## 2017-05-01 ENCOUNTER — Ambulatory Visit (INDEPENDENT_AMBULATORY_CARE_PROVIDER_SITE_OTHER): Payer: Medicare Other | Admitting: *Deleted

## 2017-05-01 DIAGNOSIS — I428 Other cardiomyopathies: Secondary | ICD-10-CM

## 2017-05-01 NOTE — Telephone Encounter (Signed)
Spoke with pt and reminded pt of remote transmission that is due today. Pt verbalized understanding.   

## 2017-05-02 ENCOUNTER — Encounter: Payer: Self-pay | Admitting: Cardiology

## 2017-05-02 NOTE — Progress Notes (Signed)
Remote ICD transmission.   

## 2017-05-03 ENCOUNTER — Ambulatory Visit (HOSPITAL_COMMUNITY)
Admission: RE | Admit: 2017-05-03 | Discharge: 2017-05-03 | Disposition: A | Discharge status: Home / Self Care | Payer: Medicare Other | Source: Ambulatory Visit | Attending: Internal Medicine | Admitting: Internal Medicine

## 2017-05-03 ENCOUNTER — Encounter (HOSPITAL_COMMUNITY): Payer: Self-pay | Admitting: Internal Medicine

## 2017-05-03 ENCOUNTER — Other Ambulatory Visit: Payer: Self-pay

## 2017-05-03 VITALS — BP 130/80 | HR 68 | Wt 166.8 lb

## 2017-05-03 DIAGNOSIS — I10 Essential (primary) hypertension: Secondary | ICD-10-CM | POA: Diagnosis not present

## 2017-05-03 DIAGNOSIS — E1122 Type 2 diabetes mellitus with diabetic chronic kidney disease: Secondary | ICD-10-CM | POA: Diagnosis not present

## 2017-05-03 DIAGNOSIS — I428 Other cardiomyopathies: Secondary | ICD-10-CM | POA: Insufficient documentation

## 2017-05-03 DIAGNOSIS — Z952 Presence of prosthetic heart valve: Secondary | ICD-10-CM | POA: Diagnosis not present

## 2017-05-03 DIAGNOSIS — Z7901 Long term (current) use of anticoagulants: Secondary | ICD-10-CM | POA: Insufficient documentation

## 2017-05-03 DIAGNOSIS — I2510 Atherosclerotic heart disease of native coronary artery without angina pectoris: Secondary | ICD-10-CM | POA: Diagnosis not present

## 2017-05-03 DIAGNOSIS — Z7984 Long term (current) use of oral hypoglycemic drugs: Secondary | ICD-10-CM | POA: Insufficient documentation

## 2017-05-03 DIAGNOSIS — I130 Hypertensive heart and chronic kidney disease with heart failure and stage 1 through stage 4 chronic kidney disease, or unspecified chronic kidney disease: Secondary | ICD-10-CM | POA: Insufficient documentation

## 2017-05-03 DIAGNOSIS — Z7982 Long term (current) use of aspirin: Secondary | ICD-10-CM | POA: Insufficient documentation

## 2017-05-03 DIAGNOSIS — Z79899 Other long term (current) drug therapy: Secondary | ICD-10-CM | POA: Diagnosis not present

## 2017-05-03 DIAGNOSIS — I5022 Chronic systolic (congestive) heart failure: Secondary | ICD-10-CM | POA: Diagnosis not present

## 2017-05-03 DIAGNOSIS — I080 Rheumatic disorders of both mitral and aortic valves: Secondary | ICD-10-CM | POA: Diagnosis not present

## 2017-05-03 DIAGNOSIS — Z9889 Other specified postprocedural states: Secondary | ICD-10-CM | POA: Insufficient documentation

## 2017-05-03 DIAGNOSIS — Z95810 Presence of automatic (implantable) cardiac defibrillator: Secondary | ICD-10-CM | POA: Diagnosis not present

## 2017-05-03 DIAGNOSIS — E785 Hyperlipidemia, unspecified: Secondary | ICD-10-CM | POA: Diagnosis not present

## 2017-05-03 DIAGNOSIS — N183 Chronic kidney disease, stage 3 (moderate): Secondary | ICD-10-CM | POA: Diagnosis not present

## 2017-05-03 DIAGNOSIS — Z87442 Personal history of urinary calculi: Secondary | ICD-10-CM | POA: Diagnosis not present

## 2017-05-03 LAB — BASIC METABOLIC PANEL
Anion gap: 11 (ref 5–15)
BUN: 14 mg/dL (ref 6–20)
CALCIUM: 9.2 mg/dL (ref 8.9–10.3)
CO2: 24 mmol/L (ref 22–32)
CREATININE: 1.04 mg/dL — AB (ref 0.44–1.00)
Chloride: 108 mmol/L (ref 101–111)
GFR calc Af Amer: >60 mL/min (ref >60)
GFR calc non Af Amer: 52 mL/min — ABNORMAL LOW (ref >60)
GLUCOSE: 85 mg/dL (ref 65–99)
Potassium: 4 mmol/L (ref 3.5–5.1)
Sodium: 143 mmol/L (ref 135–145)

## 2017-05-03 MED ORDER — SACUBITRIL-VALSARTAN 97-103 MG PO TABS: 1.0000 | ORAL_TABLET | Freq: Two times a day (BID) | ORAL | 3 refills | Status: DC

## 2017-05-03 MED ORDER — FUROSEMIDE 40 MG PO TABS: 40.0000 mg | ORAL_TABLET | ORAL | 2 refills | Status: DC | PRN

## 2017-05-03 NOTE — Patient Instructions (Signed)
Increase Entresto 97/103 mg (1 tab), twice a day  Decrease Furosemide to as needed  Labs drawn today (if we do not call you, then your lab work was stable)   Your physician recommends that you schedule a follow-up appointment in: 6 months with Dr. Gala Romney  (we will call you)

## 2017-05-03 NOTE — Progress Notes (Signed)
[facility]   Patient ID: Deborah [name], female   DOB: 06-12-1945, 72 y.o.   MRN: [id number] PCP: Petra Kuba HF MD:     HPI: Deborah [name] is a very pleasant 72 year old woman with a history of congestive heart failure secondary to nonischemic cardiomyopathy, ejection fraction 25% s/p St Jude single chamber ICD.  She also has a history of mitral valve disease and status post mitral valve replacement with a St. Jude valve in 2005.  Remainder of her history is notable for diabetes, hypertension, hyperlipidemia, and kidney stones.   CPX performed June 2011 pVO2 16.8 slope 26.1 RER 1.11 O2 pulse 90% predicted O2 sat 87% at peak with possible hypoventilation.  05/12/11 ECHO 20-25%.  Mechanical MVR appears normal.   03/19/13 Echo 25% Mechanical MVR appears normal.   12/04/14 Echo EF 20-25% 08/10/16 EF 20% MVR ok   She returns today for HF follow up. Doing well. Had the flu recently and was quite sick. Now getting better. Says she can do all housework and grocery shopping without a problem. No edema, orthopnea or PND. Tolerating Entresto without problem. No dizziness. Taking Coumadin without bleeding.   No ICD shocks   ROS: All systems negative except as listed in HPI, PMH and Problem List.  Past Medical History:  Diagnosis Date  . Aortic stenosis, mild    mean gradient echo 09/09  . CHF (congestive heart failure) (HCC)    --Non-ischemic CM EF 30-35% by echo 9/09 (Previous 25%)  --s/p St. Jude single chamber ICD 04/09 --Minimal  CAD by cath 2004 LAD 20% LCX 20% Ramus 30% RCA nl --echo 05/11 EF 20-25%  . Diabetes mellitus    type 2  . Hyperlipidemia   . Hypertension   . Kidney stones   . MR (congenital mitral regurgitation)    Severe MR s/p St. Jude mechanical MVR by Gasper Lloyd 2005    Current Outpatient Medications  Medication Sig Dispense Refill  . aspirin EC 81 MG tablet Take 81 mg by mouth daily.    Marland Kitchen atorvastatin (LIPITOR) 40 MG tablet Take 1 tablet (40 mg total) by  mouth daily. 90 tablet 1  . carvedilol (COREG) 25 MG tablet Take 1 tablet (25 mg total) by mouth 2 (two) times daily with a meal. 180 tablet 2  . Choline Fenofibrate (TRILIPIX) 135 MG capsule Take 1 capsule (135 mg total) by mouth daily. 90 capsule 1  . Digoxin 62.5 MCG TABS Take 1 tablet by mouth daily. 90 tablet 2  . furosemide (LASIX) 40 MG tablet Take 1 tablet (40 mg total) by mouth every other day. 45 tablet 2  . HYDROcodone-acetaminophen (NORCO) 7.5-325 MG tablet Take 1 tablet by mouth 2 (two) times daily as needed for moderate pain.     . metFORMIN (GLUCOPHAGE) 500 MG tablet Take 500 mg by mouth daily.    . ondansetron (ZOFRAN) 4 MG tablet Take 1 tablet (4 mg total) by mouth every 6 (six) hours as needed for nausea. 20 tablet 0  . pantoprazole (PROTONIX) 40 MG tablet Take 1 tablet (40 mg total) by mouth 2 (two) times daily. 60 tablet 1  . sacubitril-valsartan (ENTRESTO) 49-51 MG Take 1 tablet by mouth 2 (two) times daily. 60 tablet 6  . temazepam (RESTORIL) 15 MG capsule Take 15 mg by mouth at bedtime as needed for sleep.    Marland Kitchen warfarin (COUMADIN) 5 MG tablet Take as directed by coumadin clinic 150 tablet 1   No current facility-administered medications for  this encounter.      PHYSICAL EXAM: Vitals:   05/03/17 1438  BP: 130/80  Pulse: 68  SpO2: 96%  Weight: 166 lb 12.8 oz (75.7 kg)   General:  Well appearing. No resp difficulty HEENT: normal Neck: supple. no JVD. Carotids 2+ bilat; no bruits. No lymphadenopathy or thryomegaly appreciated. Cor: PMI nondisplaced. RRR . Mechanical s1. No murmurs  Lungs: clear Abdomen: soft, nontender, nondistended. No hepatosplenomegaly. No bruits or masses. Good bowel sounds. Extremities: no cyanosis, clubbing, rash, edema Neuro: alert & orientedx3, cranial nerves grossly intact. moves all 4 extremities w/o difficulty. Affect pleasant  ECG: NSR 69 IVCD Personally reviewed  ASSESSMENT & PLAN:  1. Chronic Systolic Heart Failure, NICM EF  20-25% in 11/2014, St. Jude ICD NYHA III.  - Echo 5/18. EF 20% - Doing well  NYHA II. Volume status looks good.  - Increase Entresto to 97/103 mg BID - Can drop lasix to PRN only  - Continue goal dose Coreg 25mg  BID.  - Continue digoxin, get dig level and BMET today.  - ICD interrogated personally No VT/AF. Volume looks good. Activity ~6hr/day  2. S/P MVR - on coumadin - INR followed by [employer]. Office. Goal 2.5-3.0 Continue ASA 81 - Reminded her of SBE prophylaxis 3. HTN - Blood pressure well controlled.  4. CKD stage III: - labs today 5. NICM s/p St. Jude ICD.  - followed by Dr. Darrow Bussing, MD 2:58 PM

## 2017-05-05 ENCOUNTER — Ambulatory Visit (INDEPENDENT_AMBULATORY_CARE_PROVIDER_SITE_OTHER): Payer: Medicare Other

## 2017-05-05 DIAGNOSIS — Z5181 Encounter for therapeutic drug level monitoring: Secondary | ICD-10-CM | POA: Diagnosis not present

## 2017-05-05 DIAGNOSIS — I059 Rheumatic mitral valve disease, unspecified: Secondary | ICD-10-CM

## 2017-05-05 LAB — POCT INR: INR: 3

## 2017-05-05 NOTE — Patient Instructions (Signed)
Description   Continue on same dosage 1.5 tablets daily except take 2 tablets on Mondays.  Recheck INR in 2 weeks.  Call our office if you have any questions [phone]

## 2017-05-19 ENCOUNTER — Ambulatory Visit (INDEPENDENT_AMBULATORY_CARE_PROVIDER_SITE_OTHER): Payer: Medicare Other | Admitting: *Deleted

## 2017-05-19 DIAGNOSIS — Z5181 Encounter for therapeutic drug level monitoring: Secondary | ICD-10-CM | POA: Diagnosis not present

## 2017-05-19 DIAGNOSIS — I059 Rheumatic mitral valve disease, unspecified: Secondary | ICD-10-CM | POA: Diagnosis not present

## 2017-05-19 LAB — POCT INR: INR: 2.9

## 2017-05-19 NOTE — Patient Instructions (Signed)
Description   Continue on same dosage 1.5 tablets daily except take 2 tablets on Mondays.  Recheck INR in 3 weeks.  Call our office if you have any questions [phone]

## 2017-05-24 LAB — CUP PACEART REMOTE DEVICE CHECK
Battery Remaining Longevity: 84 mo
Battery Remaining Percentage: 92 %
Battery Voltage: 3.14 V
Brady Statistic RV Percent Paced: 1 %
HIGH POWER IMPEDANCE MEASURED VALUE: 40 Ohm
HIGH POWER IMPEDANCE MEASURED VALUE: 41 Ohm
Implantable Lead Implant Date: 20090410
Implantable Lead Model: 7120
Lead Channel Impedance Value: 480 Ohm
Lead Channel Pacing Threshold Amplitude: 0.75 V
Lead Channel Sensing Intrinsic Amplitude: 11.7 mV
MDC IDC LEAD LOCATION: 753860
MDC IDC MSMT LEADCHNL RV PACING THRESHOLD PULSEWIDTH: 0.5 ms
MDC IDC PG IMPLANT DT: 20180423
MDC IDC PG SERIAL: 7423042
MDC IDC SESS DTM: 20190121201539
MDC IDC SET LEADCHNL RV PACING AMPLITUDE: 2.5 V
MDC IDC SET LEADCHNL RV PACING PULSEWIDTH: 0.5 ms
MDC IDC SET LEADCHNL RV SENSING SENSITIVITY: 0.5 mV

## 2017-05-29 DIAGNOSIS — H40033 Anatomical narrow angle, bilateral: Secondary | ICD-10-CM | POA: Diagnosis not present

## 2017-05-29 DIAGNOSIS — E119 Type 2 diabetes mellitus without complications: Secondary | ICD-10-CM | POA: Diagnosis not present

## 2017-06-09 ENCOUNTER — Ambulatory Visit (INDEPENDENT_AMBULATORY_CARE_PROVIDER_SITE_OTHER): Payer: Medicare Other | Admitting: *Deleted

## 2017-06-09 DIAGNOSIS — I059 Rheumatic mitral valve disease, unspecified: Secondary | ICD-10-CM | POA: Diagnosis not present

## 2017-06-09 DIAGNOSIS — Z952 Presence of prosthetic heart valve: Secondary | ICD-10-CM

## 2017-06-09 DIAGNOSIS — Z5181 Encounter for therapeutic drug level monitoring: Secondary | ICD-10-CM

## 2017-06-09 LAB — POCT INR: INR: 2.5

## 2017-06-09 NOTE — Patient Instructions (Signed)
Description   Continue on same dosage 1.5 tablets daily except take 2 tablets on Mondays.  Recheck INR in 4 weeks.  Call our office if you have any questions [phone]

## 2017-06-29 ENCOUNTER — Telehealth: Payer: Self-pay | Admitting: Internal Medicine

## 2017-06-29 MED ORDER — WARFARIN SODIUM 5 MG PO TABS
ORAL_TABLET | ORAL | 1 refills | Status: DC
Start: 2017-06-29 — End: 2017-09-20

## 2017-06-29 NOTE — Telephone Encounter (Signed)
rx sent as requested.

## 2017-06-29 NOTE — Telephone Encounter (Signed)
New Message    *STAT* If patient is at the pharmacy, call can be transferred to refill team.   1. Which medications need to be refilled? (please list name of each medication and dose if known) warfarin (COUMADIN) 5 MG tablet  2. Which pharmacy/location (including street and city if local pharmacy) is medication to be sent to? [facility] [address]  3. Do they need a 30 day or 90 day supply? 30

## 2017-07-07 ENCOUNTER — Ambulatory Visit (INDEPENDENT_AMBULATORY_CARE_PROVIDER_SITE_OTHER): Payer: Medicare Other | Admitting: *Deleted

## 2017-07-07 DIAGNOSIS — Z5181 Encounter for therapeutic drug level monitoring: Secondary | ICD-10-CM

## 2017-07-07 DIAGNOSIS — I059 Rheumatic mitral valve disease, unspecified: Secondary | ICD-10-CM

## 2017-07-07 LAB — POCT INR: INR: 2.5

## 2017-07-07 NOTE — Patient Instructions (Signed)
Description   Today take an 1/2  tablets, then Continue on same dosage 1.5 tablets daily except take 2 tablets on Mondays.  Recheck INR in 4 weeks.  Call our office if you have any questions [phone]

## 2017-07-27 DIAGNOSIS — I10 Essential (primary) hypertension: Secondary | ICD-10-CM | POA: Diagnosis not present

## 2017-07-27 DIAGNOSIS — E1165 Type 2 diabetes mellitus with hyperglycemia: Secondary | ICD-10-CM | POA: Diagnosis not present

## 2017-07-27 DIAGNOSIS — E782 Mixed hyperlipidemia: Secondary | ICD-10-CM | POA: Diagnosis not present

## 2017-07-31 ENCOUNTER — Ambulatory Visit (INDEPENDENT_AMBULATORY_CARE_PROVIDER_SITE_OTHER): Payer: Medicare Other | Admitting: *Deleted

## 2017-07-31 DIAGNOSIS — I428 Other cardiomyopathies: Secondary | ICD-10-CM | POA: Diagnosis not present

## 2017-07-31 NOTE — Progress Notes (Signed)
Remote ICD transmission.   

## 2017-08-01 ENCOUNTER — Encounter: Payer: Self-pay | Admitting: Cardiology

## 2017-08-01 LAB — CUP PACEART REMOTE DEVICE CHECK
Date Time Interrogation Session: 20190423153337
Implantable Pulse Generator Implant Date: 20180423
MDC IDC LEAD IMPLANT DT: 20090410
MDC IDC LEAD LOCATION: 753860
Pulse Gen Serial Number: 7423042

## 2017-08-08 DIAGNOSIS — E1165 Type 2 diabetes mellitus with hyperglycemia: Secondary | ICD-10-CM | POA: Diagnosis not present

## 2017-08-08 DIAGNOSIS — I10 Essential (primary) hypertension: Secondary | ICD-10-CM | POA: Diagnosis not present

## 2017-08-08 DIAGNOSIS — I429 Cardiomyopathy, unspecified: Secondary | ICD-10-CM | POA: Diagnosis not present

## 2017-08-08 DIAGNOSIS — I2510 Atherosclerotic heart disease of native coronary artery without angina pectoris: Secondary | ICD-10-CM | POA: Diagnosis not present

## 2017-08-08 DIAGNOSIS — E782 Mixed hyperlipidemia: Secondary | ICD-10-CM | POA: Diagnosis not present

## 2017-08-09 ENCOUNTER — Ambulatory Visit (INDEPENDENT_AMBULATORY_CARE_PROVIDER_SITE_OTHER): Payer: Medicare Other | Admitting: *Deleted

## 2017-08-09 DIAGNOSIS — Z5181 Encounter for therapeutic drug level monitoring: Secondary | ICD-10-CM

## 2017-08-09 DIAGNOSIS — I059 Rheumatic mitral valve disease, unspecified: Secondary | ICD-10-CM

## 2017-08-09 LAB — POCT INR: INR: 2.4

## 2017-08-09 NOTE — Patient Instructions (Signed)
Description   Today take an extra 1/2 tablet, then start taking 1.5 tablets daily except take 2 tablets on Mondays and Fridays.  Recheck INR in 2 weeks.  Call our office if you have any questions [phone]

## 2017-08-23 ENCOUNTER — Ambulatory Visit (INDEPENDENT_AMBULATORY_CARE_PROVIDER_SITE_OTHER): Payer: Medicare Other | Admitting: *Deleted

## 2017-08-23 DIAGNOSIS — Z952 Presence of prosthetic heart valve: Secondary | ICD-10-CM | POA: Diagnosis not present

## 2017-08-23 DIAGNOSIS — Z5181 Encounter for therapeutic drug level monitoring: Secondary | ICD-10-CM | POA: Diagnosis not present

## 2017-08-23 DIAGNOSIS — I059 Rheumatic mitral valve disease, unspecified: Secondary | ICD-10-CM

## 2017-08-23 LAB — POCT INR: INR: 3.6

## 2017-08-23 NOTE — Patient Instructions (Signed)
Description   Do not take coumadin tomorrow May 16th as she has already taken coumadin today then continue  taking  1.5 tablets daily except take 2 tablets on Mondays and Fridays.  Recheck INR in 2 weeks.  Call our office if you have any questions [phone]

## 2017-09-08 ENCOUNTER — Ambulatory Visit (INDEPENDENT_AMBULATORY_CARE_PROVIDER_SITE_OTHER): Payer: Medicare Other | Admitting: *Deleted

## 2017-09-08 DIAGNOSIS — Z5181 Encounter for therapeutic drug level monitoring: Secondary | ICD-10-CM | POA: Diagnosis not present

## 2017-09-08 DIAGNOSIS — I059 Rheumatic mitral valve disease, unspecified: Secondary | ICD-10-CM

## 2017-09-08 LAB — POCT INR: INR: 3.7 — AB (ref 2.0–3.0)

## 2017-09-08 NOTE — Patient Instructions (Addendum)
Description   Do not take coumadin tomorrow (has already taken coumadin today) then start taking 1.5 tablets daily except take 2 tablets on Mondays.  Recheck INR in 2 weeks with MD appt.  Call our office if you have any questions [phone]

## 2017-09-12 ENCOUNTER — Other Ambulatory Visit: Payer: Self-pay | Admitting: Internal Medicine

## 2017-09-12 NOTE — Telephone Encounter (Signed)
This is a CHF pt 

## 2017-09-20 ENCOUNTER — Ambulatory Visit (INDEPENDENT_AMBULATORY_CARE_PROVIDER_SITE_OTHER): Payer: Medicare HMO | Admitting: Internal Medicine

## 2017-09-20 ENCOUNTER — Encounter: Payer: Self-pay | Admitting: Internal Medicine

## 2017-09-20 ENCOUNTER — Ambulatory Visit (INDEPENDENT_AMBULATORY_CARE_PROVIDER_SITE_OTHER): Payer: Medicare HMO | Admitting: *Deleted

## 2017-09-20 ENCOUNTER — Encounter (INDEPENDENT_AMBULATORY_CARE_PROVIDER_SITE_OTHER): Payer: Self-pay

## 2017-09-20 VITALS — BP 138/76 | HR 61 | Ht 63.0 in | Wt 169.0 lb

## 2017-09-20 DIAGNOSIS — I428 Other cardiomyopathies: Secondary | ICD-10-CM

## 2017-09-20 DIAGNOSIS — I059 Rheumatic mitral valve disease, unspecified: Secondary | ICD-10-CM

## 2017-09-20 DIAGNOSIS — I110 Hypertensive heart disease with heart failure: Secondary | ICD-10-CM | POA: Diagnosis not present

## 2017-09-20 DIAGNOSIS — I5022 Chronic systolic (congestive) heart failure: Secondary | ICD-10-CM

## 2017-09-20 DIAGNOSIS — Z5181 Encounter for therapeutic drug level monitoring: Secondary | ICD-10-CM | POA: Diagnosis not present

## 2017-09-20 DIAGNOSIS — Z95810 Presence of automatic (implantable) cardiac defibrillator: Secondary | ICD-10-CM

## 2017-09-20 LAB — CUP PACEART INCLINIC DEVICE CHECK
Battery Remaining Longevity: 84 mo
Brady Statistic RV Percent Paced: 0.17 %
HighPow Impedance: 35.55 Ohm
Implantable Lead Location: 753860
Implantable Lead Model: 7120
Lead Channel Impedance Value: 437.5 Ohm
Lead Channel Pacing Threshold Pulse Width: 0.5 ms
Lead Channel Sensing Intrinsic Amplitude: 11.7 mV
Lead Channel Setting Pacing Amplitude: 2.5 V
Lead Channel Setting Pacing Pulse Width: 0.5 ms
Lead Channel Setting Sensing Sensitivity: 0.5 mV
MDC IDC LEAD IMPLANT DT: 20090410
MDC IDC MSMT LEADCHNL RV PACING THRESHOLD AMPLITUDE: 0.75 V
MDC IDC MSMT LEADCHNL RV PACING THRESHOLD AMPLITUDE: 0.75 V
MDC IDC MSMT LEADCHNL RV PACING THRESHOLD PULSEWIDTH: 0.5 ms
MDC IDC PG IMPLANT DT: 20180423
MDC IDC SESS DTM: 20190612115936
Pulse Gen Serial Number: 7423042

## 2017-09-20 LAB — POCT INR: INR: 3.1 — AB (ref 2.0–3.0)

## 2017-09-20 MED ORDER — WARFARIN SODIUM 5 MG PO TABS: ORAL_TABLET | ORAL | 1 refills | Status: DC

## 2017-09-20 NOTE — Patient Instructions (Signed)
Medication Instructions:  Your physician recommends that you continue on your current medications as directed. Please refer to the Current Medication list given to you today.  Labwork: You will have labs drawn today: CBC, BMP, and Digoxin level  Testing/Procedures: None ordered.  Follow-Up: Your physician wants you to follow-up in: One Year with Dr Graciela Husbands. You will receive a reminder letter in the mail two months in advance. If you don't receive a letter, please call our office to schedule the follow-up appointment.   Remote monitoring is used to monitor your Pacemaker of ICD from home. This monitoring reduces the number of office visits required to check your device to one time per year. It allows [place] to keep an eye on the functioning of your device to ensure it is working properly. You are scheduled for a device check from home on 10/30/2017. You may send your transmission at any time that day. If you have a wireless device, the transmission will be sent automatically. After your physician reviews your transmission, you will receive a postcard with your next transmission date.   Any Other Special Instructions Will Be Listed Below (If Applicable).     If you need a refill on your cardiac medications before your next appointment, please call your pharmacy.

## 2017-09-20 NOTE — Patient Instructions (Signed)
Description   Continue  taking 1.5 tablets daily except take 2 tablets on Mondays.  Recheck INR in 3 weeks .  Call our office if you have any questions [phone]

## 2017-09-20 NOTE — Progress Notes (Signed)
Patient Care Team: Georgianne Fick, MD as PCP - General (Internal Medicine) Corine Shelter, MD (Pulmonary Disease) Bensimhon, Bevelyn Buckles, MD (Cardiology) Duke Salvia, MD (Cardiology)   HPI  Deborah [name] is a 72 y.o. female Seen in followup for ICD implantation for primary prevention with a history of congestive heart failure, secondary to nonischemic cardiomyopathy.   She is s/p mechanical MVR St. Jude valve in 2005.  Her ICD  underwent generator replacement 4/18   She's been started on Entresto.  The patient denies chest pain, shortness of breath, nocturnal dyspnea, orthopnea or peripheral edema.  There have been no palpitations, lightheadedness or syncope.    Echo 12/14 ejection fraction around 25%  Echo 8/16  EF 25% Echo 5/18  EF 15%   CHF records reviewed   Past Medical History:  Diagnosis Date  . Aortic stenosis, mild    mean gradient echo 09/09  . CHF (congestive heart failure) (HCC)    --Non-ischemic CM EF 30-35% by echo 9/09 (Previous 25%)  --s/p St. Jude single chamber ICD 04/09 --Minimal  CAD by cath 2004 LAD 20% LCX 20% Ramus 30% RCA nl --echo 05/11 EF 20-25%  . Diabetes mellitus    type 2  . Hyperlipidemia   . Hypertension   . Kidney stones   . MR (congenital mitral regurgitation)    Severe MR s/p St. Jude mechanical MVR by Gasper Lloyd 2005    Past Surgical History:  Procedure Laterality Date  . APPENDECTOMY  1979  . CARDIAC DEFIBRILLATOR PLACEMENT  2009  . CARDIAC VALVE SURGERY  2005  . ESOPHAGOGASTRODUODENOSCOPY N/A 12/05/2014   Procedure: ESOPHAGOGASTRODUODENOSCOPY (EGD);  Surgeon: Dorena Cookey, MD;  Location: [facility] ENDOSCOPY;  Service: Endoscopy;  Laterality: N/A;  . ICD GENERATOR CHANGEOUT N/A 08/01/2016   Procedure: ICD Generator Changeout;  Surgeon: Duke Salvia, MD;  Location: [facility] INVASIVE CV LAB;  Service: Cardiovascular;  Laterality: N/A;  . INSERT / REPLACE / REMOVE PACEMAKER      Current Outpatient Medications  Medication  Sig Dispense Refill  . aspirin EC 81 MG tablet Take 81 mg by mouth daily.    Marland Kitchen atorvastatin (LIPITOR) 40 MG tablet Take 1 tablet (40 mg total) by mouth daily. 90 tablet 1  . carvedilol (COREG) 25 MG tablet Take 1 tablet (25 mg total) by mouth 2 (two) times daily with a meal. 180 tablet 2  . Choline Fenofibrate (TRILIPIX) 135 MG capsule Take 1 capsule (135 mg total) by mouth daily. 90 capsule 1  . Digoxin 62.5 MCG TABS Take 1 tablet by mouth daily. 90 tablet 2  . HYDROcodone-acetaminophen (NORCO) 7.5-325 MG tablet Take 1 tablet by mouth 2 (two) times daily as needed for moderate pain.     . metFORMIN (GLUCOPHAGE) 500 MG tablet Take 500 mg by mouth daily.    . ondansetron (ZOFRAN) 4 MG tablet Take 1 tablet (4 mg total) by mouth every 6 (six) hours as needed for nausea. 20 tablet 0  . pantoprazole (PROTONIX) 40 MG tablet Take 1 tablet (40 mg total) by mouth 2 (two) times daily. 60 tablet 1  . sacubitril-valsartan (ENTRESTO) 97-103 MG Take 1 tablet by mouth 2 (two) times daily. 60 tablet 3  . temazepam (RESTORIL) 15 MG capsule Take 15 mg by mouth at bedtime as needed for sleep.    Marland Kitchen warfarin (COUMADIN) 5 MG tablet Take as directed by coumadin clinic 150 tablet 1  . furosemide (LASIX) 40 MG tablet Take 1 tablet (40  mg total) by mouth as needed. 15 tablet 2   No current facility-administered medications for this visit.     Allergies  Allergen Reactions  . Strawberry Extract Rash    Rash & itching.    Review of Systems negative except from HPI and PMH  Physical Exam BP 138/76   Pulse 61   Ht 5\' 3"  (1.6 m)   Wt 169 lb (76.7 kg)   SpO2 99%   BMI 29.94 kg/m  Well developed and nourished in no acute distress HENT normal Neck supple with JVP-flat Clear Regular rate and rhythm, no murmurs or gallops Abd-soft with active BS No Clubbing cyanosis edema Skin-warm and dry A & Oriented  Grossly normal sensory and motor function   ECG  Sinus @ 58 17/14/44    Assessment and   Plan  Valve replacement-mitral-mechanical  Nonischemic cardiomyopathy  Hypertension  HFrEF chronic class IIb-3   Implantable defibrillator-St. Jude  The patient's device was interrogated.  The information was reviewed. No changes were made in the programming.      Euvolemic continue current meds  BP well controlled

## 2017-09-21 LAB — BASIC METABOLIC PANEL
BUN / CREAT RATIO: 14 (ref 12–28)
BUN: 14 mg/dL (ref 8–27)
CO2: 26 mmol/L (ref 20–29)
Calcium: 9 mg/dL (ref 8.7–10.3)
Chloride: 103 mmol/L (ref 96–106)
Creatinine, Ser: 0.98 mg/dL (ref 0.57–1.00)
GFR calc Af Amer: 66 mL/min/{1.73_m2} (ref >59)
GFR, EST NON AFRICAN AMERICAN: 57 mL/min/{1.73_m2} — AB (ref >59)
GLUCOSE: 86 mg/dL (ref 65–99)
Potassium: 5 mmol/L (ref 3.5–5.2)
SODIUM: 145 mmol/L — AB (ref 134–144)

## 2017-09-21 LAB — CBC WITH DIFFERENTIAL/PLATELET
BASOS ABS: 0 10*3/uL (ref 0.0–0.2)
Basos: 0 %
EOS (ABSOLUTE): 0.1 10*3/uL (ref 0.0–0.4)
Eos: 2 %
HEMOGLOBIN: 10.8 g/dL — AB (ref 11.1–15.9)
Hematocrit: 33.2 % — ABNORMAL LOW (ref 34.0–46.6)
Immature Grans (Abs): 0 10*3/uL (ref 0.0–0.1)
Immature Granulocytes: 0 %
LYMPHS ABS: 1.1 10*3/uL (ref 0.7–3.1)
Lymphs: 19 %
MCH: 30.4 pg (ref 26.6–33.0)
MCHC: 32.5 g/dL (ref 31.5–35.7)
MCV: 94 fL (ref 79–97)
MONOCYTES: 7 %
Monocytes Absolute: 0.4 10*3/uL (ref 0.1–0.9)
NEUTROS ABS: 4.2 10*3/uL (ref 1.4–7.0)
Neutrophils: 72 %
Platelets: 238 10*3/uL (ref 150–450)
RBC: 3.55 x10E6/uL — ABNORMAL LOW (ref 3.77–5.28)
RDW: 13.5 % (ref 12.3–15.4)
WBC: 5.9 10*3/uL (ref 3.4–10.8)

## 2017-09-21 LAB — DIGOXIN LEVEL: Digoxin, Serum: 0.6 ng/mL (ref 0.5–0.9)

## 2017-10-11 ENCOUNTER — Ambulatory Visit (INDEPENDENT_AMBULATORY_CARE_PROVIDER_SITE_OTHER): Payer: Medicare HMO | Admitting: *Deleted

## 2017-10-11 DIAGNOSIS — Z5181 Encounter for therapeutic drug level monitoring: Secondary | ICD-10-CM | POA: Diagnosis not present

## 2017-10-11 DIAGNOSIS — I059 Rheumatic mitral valve disease, unspecified: Secondary | ICD-10-CM | POA: Diagnosis not present

## 2017-10-11 LAB — POCT INR: INR: 3 (ref 2.0–3.0)

## 2017-10-11 NOTE — Patient Instructions (Signed)
Description   Continue  taking 1.5 tablets daily except take 2 tablets on Mondays.  Recheck INR in 4 weeks .  Call our office if you have any questions [phone]

## 2017-10-18 ENCOUNTER — Other Ambulatory Visit: Payer: Self-pay

## 2017-10-18 MED ORDER — WARFARIN SODIUM 5 MG PO TABS: ORAL_TABLET | ORAL | 1 refills | Status: DC

## 2017-10-20 ENCOUNTER — Other Ambulatory Visit (HOSPITAL_COMMUNITY): Payer: Self-pay

## 2017-10-20 MED ORDER — CARVEDILOL 25 MG PO TABS: 25.0000 mg | ORAL_TABLET | Freq: Two times a day (BID) | ORAL | 1 refills | Status: DC

## 2017-10-23 ENCOUNTER — Other Ambulatory Visit: Payer: Self-pay | Admitting: *Deleted

## 2017-10-23 MED ORDER — WARFARIN SODIUM 5 MG PO TABS: ORAL_TABLET | ORAL | 1 refills | Status: DC

## 2017-10-23 NOTE — Telephone Encounter (Signed)
Paper refill from [facility] for Warfarin 5mg  refill request received. Refill sent to mail order per request.

## 2017-10-24 ENCOUNTER — Other Ambulatory Visit (HOSPITAL_COMMUNITY): Payer: Self-pay

## 2017-10-24 MED ORDER — SACUBITRIL-VALSARTAN 97-103 MG PO TABS: 1.0000 | ORAL_TABLET | Freq: Two times a day (BID) | ORAL | 3 refills | Status: DC

## 2017-10-27 ENCOUNTER — Other Ambulatory Visit (HOSPITAL_COMMUNITY): Payer: Self-pay | Admitting: *Deleted

## 2017-10-27 ENCOUNTER — Other Ambulatory Visit: Payer: Self-pay

## 2017-10-27 MED ORDER — CARVEDILOL 25 MG PO TABS: 25.0000 mg | ORAL_TABLET | Freq: Two times a day (BID) | ORAL | 1 refills | Status: DC

## 2017-10-27 MED ORDER — WARFARIN SODIUM 5 MG PO TABS: ORAL_TABLET | ORAL | 1 refills | Status: DC

## 2017-10-30 ENCOUNTER — Ambulatory Visit (INDEPENDENT_AMBULATORY_CARE_PROVIDER_SITE_OTHER): Payer: Medicare HMO | Admitting: *Deleted

## 2017-10-30 DIAGNOSIS — I428 Other cardiomyopathies: Secondary | ICD-10-CM

## 2017-10-30 NOTE — Progress Notes (Signed)
Remote ICD transmission.   

## 2017-10-31 ENCOUNTER — Encounter: Payer: Self-pay | Admitting: Cardiology

## 2017-10-31 DIAGNOSIS — I10 Essential (primary) hypertension: Secondary | ICD-10-CM | POA: Diagnosis not present

## 2017-10-31 DIAGNOSIS — E1165 Type 2 diabetes mellitus with hyperglycemia: Secondary | ICD-10-CM | POA: Diagnosis not present

## 2017-10-31 DIAGNOSIS — E782 Mixed hyperlipidemia: Secondary | ICD-10-CM | POA: Diagnosis not present

## 2017-11-07 DIAGNOSIS — E782 Mixed hyperlipidemia: Secondary | ICD-10-CM | POA: Diagnosis not present

## 2017-11-07 DIAGNOSIS — I10 Essential (primary) hypertension: Secondary | ICD-10-CM | POA: Diagnosis not present

## 2017-11-07 DIAGNOSIS — E1165 Type 2 diabetes mellitus with hyperglycemia: Secondary | ICD-10-CM | POA: Diagnosis not present

## 2017-11-07 DIAGNOSIS — I2510 Atherosclerotic heart disease of native coronary artery without angina pectoris: Secondary | ICD-10-CM | POA: Diagnosis not present

## 2017-11-26 LAB — CUP PACEART REMOTE DEVICE CHECK
Battery Voltage: 3.07 V
Date Time Interrogation Session: 20190722072005
HIGH POWER IMPEDANCE MEASURED VALUE: 37 Ohm
HighPow Impedance: 37 Ohm
Implantable Lead Implant Date: 20090410
Implantable Lead Location: 753860
Implantable Pulse Generator Implant Date: 20180423
Lead Channel Pacing Threshold Pulse Width: 0.5 ms
Lead Channel Sensing Intrinsic Amplitude: 11.7 mV
Lead Channel Setting Pacing Amplitude: 2.5 V
Lead Channel Setting Pacing Pulse Width: 0.5 ms
MDC IDC MSMT BATTERY REMAINING LONGEVITY: 80 mo
MDC IDC MSMT BATTERY REMAINING PERCENTAGE: 88 %
MDC IDC MSMT LEADCHNL RV IMPEDANCE VALUE: 450 Ohm
MDC IDC MSMT LEADCHNL RV PACING THRESHOLD AMPLITUDE: 0.75 V
MDC IDC SET LEADCHNL RV SENSING SENSITIVITY: 0.5 mV
MDC IDC STAT BRADY RV PERCENT PACED: 1 %
Pulse Gen Serial Number: 7423042

## 2017-11-30 ENCOUNTER — Ambulatory Visit (INDEPENDENT_AMBULATORY_CARE_PROVIDER_SITE_OTHER): Payer: Medicare HMO

## 2017-11-30 DIAGNOSIS — Z5181 Encounter for therapeutic drug level monitoring: Secondary | ICD-10-CM | POA: Diagnosis not present

## 2017-11-30 DIAGNOSIS — I059 Rheumatic mitral valve disease, unspecified: Secondary | ICD-10-CM | POA: Diagnosis not present

## 2017-11-30 LAB — POCT INR: INR: 4.4 — AB (ref 2.0–3.0)

## 2017-11-30 NOTE — Patient Instructions (Signed)
Description   Skip tomorrow's dosage of Coumadin, then start taking 1.5 tablets daily.  Recheck INR in 2 weeks .  Call our office if you have any questions [phone]

## 2017-12-14 ENCOUNTER — Ambulatory Visit (INDEPENDENT_AMBULATORY_CARE_PROVIDER_SITE_OTHER): Payer: Medicare HMO | Admitting: Pharmacist

## 2017-12-14 DIAGNOSIS — I059 Rheumatic mitral valve disease, unspecified: Secondary | ICD-10-CM

## 2017-12-14 DIAGNOSIS — Z5181 Encounter for therapeutic drug level monitoring: Secondary | ICD-10-CM

## 2017-12-14 LAB — POCT INR: INR: 3.4 — AB (ref 2.0–3.0)

## 2017-12-14 NOTE — Patient Instructions (Signed)
Description   Continue taking 1.5 tablets daily.  Recheck INR in 3 weeks.  Call our office if you have any questions [phone]

## 2017-12-18 DIAGNOSIS — S0003XA Contusion of scalp, initial encounter: Secondary | ICD-10-CM | POA: Diagnosis not present

## 2017-12-18 DIAGNOSIS — S82042G Displaced comminuted fracture of left patella, subsequent encounter for closed fracture with delayed healing: Secondary | ICD-10-CM | POA: Diagnosis not present

## 2017-12-18 DIAGNOSIS — M25562 Pain in left knee: Secondary | ICD-10-CM | POA: Diagnosis not present

## 2017-12-18 DIAGNOSIS — M25522 Pain in left elbow: Secondary | ICD-10-CM | POA: Diagnosis not present

## 2018-01-05 ENCOUNTER — Ambulatory Visit (INDEPENDENT_AMBULATORY_CARE_PROVIDER_SITE_OTHER): Payer: Medicare HMO | Admitting: *Deleted

## 2018-01-05 DIAGNOSIS — Z5181 Encounter for therapeutic drug level monitoring: Secondary | ICD-10-CM

## 2018-01-05 DIAGNOSIS — I059 Rheumatic mitral valve disease, unspecified: Secondary | ICD-10-CM

## 2018-01-05 LAB — POCT INR: INR: 4.3 — AB (ref 2.0–3.0)

## 2018-01-05 NOTE — Patient Instructions (Addendum)
Description   Hold tomorrow's dose then continue taking 1.5 tablets daily.  Recheck INR in 2 weeks.  Call our office if you have any questions [phone]

## 2018-01-08 DIAGNOSIS — M25562 Pain in left knee: Secondary | ICD-10-CM | POA: Diagnosis not present

## 2018-01-11 DIAGNOSIS — I2510 Atherosclerotic heart disease of native coronary artery without angina pectoris: Secondary | ICD-10-CM | POA: Diagnosis not present

## 2018-01-11 DIAGNOSIS — I429 Cardiomyopathy, unspecified: Secondary | ICD-10-CM | POA: Diagnosis not present

## 2018-01-11 DIAGNOSIS — S6992XA Unspecified injury of left wrist, hand and finger(s), initial encounter: Secondary | ICD-10-CM | POA: Diagnosis not present

## 2018-01-11 DIAGNOSIS — M25532 Pain in left wrist: Secondary | ICD-10-CM | POA: Diagnosis not present

## 2018-01-11 DIAGNOSIS — R296 Repeated falls: Secondary | ICD-10-CM | POA: Diagnosis not present

## 2018-01-12 ENCOUNTER — Encounter (INDEPENDENT_AMBULATORY_CARE_PROVIDER_SITE_OTHER): Payer: Self-pay | Admitting: Orthopaedic Surgery

## 2018-01-12 ENCOUNTER — Ambulatory Visit (INDEPENDENT_AMBULATORY_CARE_PROVIDER_SITE_OTHER): Payer: Medicare HMO | Admitting: Orthopaedic Surgery

## 2018-01-12 ENCOUNTER — Ambulatory Visit (INDEPENDENT_AMBULATORY_CARE_PROVIDER_SITE_OTHER): Payer: Medicare HMO

## 2018-01-12 VITALS — Ht 63.0 in | Wt 169.0 lb

## 2018-01-12 DIAGNOSIS — S62112A Displaced fracture of triquetrum [cuneiform] bone, left wrist, initial encounter for closed fracture: Secondary | ICD-10-CM

## 2018-01-12 DIAGNOSIS — M25561 Pain in right knee: Secondary | ICD-10-CM

## 2018-01-12 MED ORDER — OXYCODONE-ACETAMINOPHEN 5-325 MG PO TABS: 1.0000 | ORAL_TABLET | Freq: Four times a day (QID) | ORAL | 0 refills | Status: DC | PRN

## 2018-01-12 NOTE — Progress Notes (Signed)
Office Visit Note   Patient: Deborah [name]           Date of Birth: 1946/01/16           MRN: [id number] Visit Date: 01/12/2018              Requested by: Georgianne Fick, MD [address] [place], [place] [address] PCP: Georgianne Fick, MD   Assessment & Plan: Visit Diagnoses:  1. Acute pain of right knee   2. Chip fracture of triquetrum, left, closed, initial encounter     Plan: Impression is right knee patella contusion and left wrist triquetrum avulsion fracture.  I reviewed the x-rays of her left wrist from the outside facility.  We will place her in a removable wrist brace.  Recommend rest ice and elevation and pain meds as needed.  Prescription for Percocet.  In terms of the right knee also recommend symptomatic treatment which we discussed today.  Recheck in 4 weeks.  Follow-Up Instructions: Return in about 4 weeks (around 02/09/2018).   Orders:  Orders Placed This Encounter  Procedures  . XR Knee 1-2 Views Right   Meds ordered this encounter  Medications  . oxyCODONE-acetaminophen (PERCOCET) 5-325 MG tablet    Sig: Take 1-2 tablets by mouth every 6 (six) hours as needed for severe pain.    Dispense:  30 tablet    Refill:  0      Procedures: No procedures performed   Clinical Data: No additional findings.   Subjective: Chief Complaint  Patient presents with  . Left Wrist - Pain  . Right Knee - Pain    Vayda comes in today for new injuries to her left wrist and right knee from a mechanical fall on 01/10/2018.  She has been referred here by her PCP.  She presents today with her family member.  She is complaining of more so of her left wrist than her right knee.  She does have swelling on the dorsum of the left wrist.  She is wearing over the counter neoprene brace for the left wrist.  She is able to ambulate with a walker.   Review of Systems  Constitutional: Negative.   HENT: Negative.   Eyes: Negative.   Respiratory: Negative.     Cardiovascular: Negative.   Endocrine: Negative.   Musculoskeletal: Negative.   Neurological: Negative.   Hematological: Negative.   Psychiatric/Behavioral: Negative.   All other systems reviewed and are negative.    Objective: Vital Signs: Ht 5\' 3"  (1.6 m)   Wt 169 lb (76.7 kg)   BMI 29.94 kg/m   Physical Exam  Constitutional: She is oriented to person, place, and time. She appears well-developed and well-nourished.  HENT:  Head: Normocephalic and atraumatic.  Eyes: EOM are normal.  Neck: Neck supple.  Pulmonary/Chest: Effort normal.  Abdominal: Soft.  Neurological: She is alert and oriented to person, place, and time.  Skin: Skin is warm. Capillary refill takes less than 2 seconds.  Psychiatric: She has a normal mood and affect. Her behavior is normal. Judgment and thought content normal.  Nursing note and vitals reviewed.   Ortho Exam Left wrist exam shows tenderness palpation over the carpal bones.  She is neurovascular intact.  Decreased range of motion of the wrist secondary to pain and guarding.  No focal deficits.  Right knee exam shows no joint effusion.  She is mildly tender patella and prepatellar bursa. Specialty Comments:  No specialty comments available.  Imaging: Xr Knee  1-2 Views Right  Result Date: 01/12/2018 No acute or structural abnormalities.  No evidence of fracture.    PMFS History: Patient Active Problem List   Diagnosis Date Noted  . Diabetes type 2, controlled (HCC)   . Chronic systolic CHF (congestive heart failure) (HCC)   . Pulmonary hypertension (HCC)   . History of mitral valve replacement with mechanical valve   . HLD (hyperlipidemia)   . Diabetes type 2, uncontrolled (HCC)   . Onychomycosis 01/03/2014  . Encounter for therapeutic drug monitoring 07/22/2013  . Valvular cardiomyopathy (HCC) 08/03/2012  . ICD-St.Jude 08/02/2010  . Long term (current) use of anticoagulants 07/01/2010  . COPD, MILD 11/19/2009  . Aortic valve  disorder 07/30/2008  . HYPERTENSION, BENIGN 01/24/2008  . Mitral valve disorder. (Status post mechanical valve replacement) 01/24/2008  . SYSTOLIC HEART FAILURE, CHRONIC 01/24/2008   Past Medical History:  Diagnosis Date  . Aortic stenosis, mild    mean gradient echo 09/09  . CHF (congestive heart failure) (HCC)    --Non-ischemic CM EF 30-35% by echo 9/09 (Previous 25%)  --s/p St. Jude single chamber ICD 04/09 --Minimal  CAD by cath 2004 LAD 20% LCX 20% Ramus 30% RCA nl --echo 05/11 EF 20-25%  . Diabetes mellitus    type 2  . Hyperlipidemia   . Hypertension   . Kidney stones   . MR (congenital mitral regurgitation)    Severe MR s/p St. Jude mechanical MVR by Gasper Lloyd 2005    Family History  Problem Relation Age of Onset  . Heart disease Mother     Past Surgical History:  Procedure Laterality Date  . APPENDECTOMY  1979  . CARDIAC DEFIBRILLATOR PLACEMENT  2009  . CARDIAC VALVE SURGERY  2005  . ESOPHAGOGASTRODUODENOSCOPY N/A 12/05/2014   Procedure: ESOPHAGOGASTRODUODENOSCOPY (EGD);  Surgeon: Dorena Cookey, MD;  Location: [facility] ENDOSCOPY;  Service: Endoscopy;  Laterality: N/A;  . ICD GENERATOR CHANGEOUT N/A 08/01/2016   Procedure: ICD Generator Changeout;  Surgeon: Duke Salvia, MD;  Location: [facility] INVASIVE CV LAB;  Service: Cardiovascular;  Laterality: N/A;  . INSERT / REPLACE / REMOVE PACEMAKER     Social History   Occupational History  . Occupation: Scientist, research (medical): RETIRED    Comment: disabled  Tobacco Use  . Smoking status: Former Smoker    Last attempt to quit: 06/16/1984    Years since quitting: 33.5  . Smokeless tobacco: Never Used  Substance and Sexual Activity  . Alcohol use: No  . Drug use: No  . Sexual activity: Not on file

## 2018-01-15 ENCOUNTER — Telehealth: Payer: Self-pay | Admitting: Internal Medicine

## 2018-01-15 NOTE — Telephone Encounter (Signed)
Spoke with pt who states she had a fall crossing the street. She states she remembers everything and it was simply a trip. She fell on her left wrist and left knee. Pt states she did not fall on her shoulder and her ICD site is not painful. Pt denies any syncope or dizziness prior to her fall. Pt was advised that she should not need to follow up with Dr Graciela Husbands unless she needs anything additional.

## 2018-01-15 NOTE — Telephone Encounter (Signed)
New Message    Patient is calling because she recently fractured her wrist. She is not have any problems with her defibrillator but she wants to know should she follow up with the cardiologist. She says there is no pain, no shortness of breath. Please call to discuss.

## 2018-01-23 ENCOUNTER — Encounter: Payer: Self-pay | Admitting: Internal Medicine

## 2018-01-23 ENCOUNTER — Ambulatory Visit: Payer: Medicare HMO | Admitting: Internal Medicine

## 2018-01-23 VITALS — BP 120/74 | HR 79 | Ht 63.0 in | Wt 158.4 lb

## 2018-01-23 DIAGNOSIS — I5022 Chronic systolic (congestive) heart failure: Secondary | ICD-10-CM | POA: Diagnosis not present

## 2018-01-23 DIAGNOSIS — Z95810 Presence of automatic (implantable) cardiac defibrillator: Secondary | ICD-10-CM

## 2018-01-23 MED ORDER — CARVEDILOL 25 MG PO TABS: 12.5000 mg | ORAL_TABLET | Freq: Two times a day (BID) | ORAL | 1 refills | Status: DC

## 2018-01-23 NOTE — Progress Notes (Signed)
Patient Care Team: Georgianne Fick, MD as PCP - General (Internal Medicine) Corine Shelter, MD (Pulmonary Disease) Bensimhon, Bevelyn Buckles, MD (Cardiology) Duke Salvia, MD (Cardiology)   HPI  Deborah [name] is a 72 y.o. female Seen in followup for ICD implantation for primary prevention with a history of congestive heart failure, secondary to nonischemic cardiomyopathy.   She is s/p mechanical MVR St. Jude valve in 2005.  Her ICD  underwent generator replacement 4/18   She's been started on Entresto and tolerating this  DATE TEST EF   12/14 Echo   25 %   8/16 Echo   25 %   5/18 Echo  15%    She has had a couple of recent falls.  Device interrogation is unrevealing.  She does not recall falling.  The first occasion she fell on the street hitting her knees and her head with a subsequent hematoma.  She has had no headache  The other time she felt walking home from the bank, again without warning.,  Denies change in functional status, no edema   Chronic stable DOE  No orthopnea  Date Cr K Hgb  9/19 0.98 5.0 10.8           CHF clinic records reviewed   Past Medical History:  Diagnosis Date  . Aortic stenosis, mild    mean gradient echo 09/09  . CHF (congestive heart failure) (HCC)    --Non-ischemic CM EF 30-35% by echo 9/09 (Previous 25%)  --s/p St. Jude single chamber ICD 04/09 --Minimal  CAD by cath 2004 LAD 20% LCX 20% Ramus 30% RCA nl --echo 05/11 EF 20-25%  . Diabetes mellitus    type 2  . Hyperlipidemia   . Hypertension   . Kidney stones   . MR (congenital mitral regurgitation)    Severe MR s/p St. Jude mechanical MVR by Gasper Lloyd 2005    Past Surgical History:  Procedure Laterality Date  . APPENDECTOMY  1979  . CARDIAC DEFIBRILLATOR PLACEMENT  2009  . CARDIAC VALVE SURGERY  2005  . ESOPHAGOGASTRODUODENOSCOPY N/A 12/05/2014   Procedure: ESOPHAGOGASTRODUODENOSCOPY (EGD);  Surgeon: Dorena Cookey, MD;  Location: [facility] ENDOSCOPY;  Service:  Endoscopy;  Laterality: N/A;  . ICD GENERATOR CHANGEOUT N/A 08/01/2016   Procedure: ICD Generator Changeout;  Surgeon: Duke Salvia, MD;  Location: [facility] INVASIVE CV LAB;  Service: Cardiovascular;  Laterality: N/A;  . INSERT / REPLACE / REMOVE PACEMAKER      Current Outpatient Medications  Medication Sig Dispense Refill  . aspirin EC 81 MG tablet Take 81 mg by mouth daily.    Marland Kitchen atorvastatin (LIPITOR) 40 MG tablet Take 1 tablet (40 mg total) by mouth daily. 90 tablet 1  . carvedilol (COREG) 25 MG tablet Take 1 tablet (25 mg total) by mouth 2 (two) times daily with a meal. 180 tablet 1  . Choline Fenofibrate (TRILIPIX) 135 MG capsule Take 1 capsule (135 mg total) by mouth daily. 90 capsule 1  . HYDROcodone-acetaminophen (NORCO) 7.5-325 MG tablet Take 1 tablet by mouth 2 (two) times daily as needed for moderate pain.     Marland Kitchen ondansetron (ZOFRAN) 4 MG tablet Take 1 tablet (4 mg total) by mouth every 6 (six) hours as needed for nausea. 20 tablet 0  . oxyCODONE-acetaminophen (PERCOCET) 5-325 MG tablet Take 1-2 tablets by mouth every 6 (six) hours as needed for severe pain. 30 tablet 0  . sacubitril-valsartan (ENTRESTO) 97-103 MG Take 1 tablet by mouth 2 (  two) times daily. 60 tablet 3  . temazepam (RESTORIL) 15 MG capsule Take 15 mg by mouth at bedtime as needed for sleep.    Marland Kitchen warfarin (COUMADIN) 5 MG tablet Take as directed by coumadin clinic 150 tablet 1   No current facility-administered medications for this visit.     Allergies  Allergen Reactions  . Strawberry Extract Rash    Rash & itching.    Review of Systems negative except from HPI and PMH  Physical Exam BP 120/74   Pulse 79   Ht 5\' 3"  (1.6 m)   Wt 158 lb 6.4 oz (71.8 kg)   SpO2 98%   BMI 28.06 kg/m  Well developed and nourished in no acute distress HENT normal Neck supple with JVP-flat Clear Regular rate and rhythm, 2/6 systolic m Abd-soft with active BS No Clubbing cyanosis edema Skin-warm and dry A & Oriented  Grossly  normal sensory and motor function    ECG  Sinus 79 15/13/40 PVC   Assessment and  Plan  Valve replacement-mitral-mechanical  Nonischemic cardiomyopathy  Hypertension  Falls  HFrEF chronic class IIB  Implantable defibrillator-St. Jude  The patient's device was interrogated.  The information was reviewed. No changes were made in the programming.      Euvolemic continue current meds  BP well controlled  Falls without arrhythmia,  Orthostatic BP's are suggestive but not diagnostic, will decrease carvedilol 25>>12.5 bif

## 2018-01-23 NOTE — Patient Instructions (Addendum)
Medication Instructions:  Your physician has recommended you make the following change in your medication:   1. Decrease your Carvedilol to 12.5mg , one half tablet, two times per day  Labwork: None ordered.  Testing/Procedures: None ordered.  Follow-Up: Your physician recommends that you schedule a follow-up appointment in:  One Year with Dr Graciela Husbands  Remote monitoring is used to monitor your Pacemaker of ICD from home. This monitoring reduces the number of office visits required to check your device to one time per year. It allows [place] to keep an eye on the functioning of your device to ensure it is working properly. You are scheduled for a device check from home on 01/29/2018. You may send your transmission at any time that day. If you have a wireless device, the transmission will be sent automatically. After your physician reviews your transmission, you will receive a postcard with your next transmission date.   Any Other Special Instructions Will Be Listed Below (If Applicable).     If you need a refill on your cardiac medications before your next appointment, please call your pharmacy.

## 2018-01-26 ENCOUNTER — Ambulatory Visit (INDEPENDENT_AMBULATORY_CARE_PROVIDER_SITE_OTHER): Payer: Medicare HMO | Admitting: *Deleted

## 2018-01-26 DIAGNOSIS — I059 Rheumatic mitral valve disease, unspecified: Secondary | ICD-10-CM

## 2018-01-26 DIAGNOSIS — Z5181 Encounter for therapeutic drug level monitoring: Secondary | ICD-10-CM

## 2018-01-26 LAB — POCT INR: INR: 5.5 — AB (ref 2.0–3.0)

## 2018-01-26 NOTE — Patient Instructions (Signed)
Description   Hold tomorrow's dose and Sunday's dose, then start taking 1.5 pills everyday except 1 pill on Mondays and Fridays. Recheck INR in 10 days.   Call our office if you have any questions [phone]

## 2018-01-29 ENCOUNTER — Encounter: Payer: Medicare HMO | Admitting: *Deleted

## 2018-01-29 LAB — CUP PACEART INCLINIC DEVICE CHECK
Date Time Interrogation Session: 20191015185349
HIGH POWER IMPEDANCE MEASURED VALUE: 41.8551
Implantable Lead Implant Date: 20090410
Implantable Pulse Generator Implant Date: 20180423
Lead Channel Pacing Threshold Amplitude: 0.5 V
Lead Channel Pacing Threshold Amplitude: 0.5 V
Lead Channel Pacing Threshold Pulse Width: 0.5 ms
Lead Channel Pacing Threshold Pulse Width: 0.5 ms
Lead Channel Sensing Intrinsic Amplitude: 12 mV
MDC IDC LEAD LOCATION: 753860
MDC IDC MSMT BATTERY REMAINING LONGEVITY: 80 mo
MDC IDC MSMT LEADCHNL RV IMPEDANCE VALUE: 525 Ohm
MDC IDC SET LEADCHNL RV PACING AMPLITUDE: 2.5 V
MDC IDC SET LEADCHNL RV PACING PULSEWIDTH: 0.5 ms
MDC IDC SET LEADCHNL RV SENSING SENSITIVITY: 0.5 mV
MDC IDC STAT BRADY RV PERCENT PACED: 0.22 %
Pulse Gen Serial Number: 7423042

## 2018-01-30 ENCOUNTER — Encounter: Payer: Self-pay | Admitting: Cardiology

## 2018-01-30 ENCOUNTER — Ambulatory Visit: Payer: Medicare HMO | Attending: Rehabilitation | Admitting: Physical Therapy

## 2018-02-05 ENCOUNTER — Ambulatory Visit (INDEPENDENT_AMBULATORY_CARE_PROVIDER_SITE_OTHER): Payer: Medicare HMO

## 2018-02-05 DIAGNOSIS — Z5181 Encounter for therapeutic drug level monitoring: Secondary | ICD-10-CM

## 2018-02-05 DIAGNOSIS — I059 Rheumatic mitral valve disease, unspecified: Secondary | ICD-10-CM | POA: Diagnosis not present

## 2018-02-05 LAB — POCT INR: INR: 1.8 — AB (ref 2.0–3.0)

## 2018-02-05 NOTE — Patient Instructions (Signed)
Description   Take an extra 1/2 tablet today, then start taking 1.5 tablets everyday except 1 tablet on Fridays. Recheck INR in 2 weeks.   Call our office if you have any questions [phone]

## 2018-02-19 DIAGNOSIS — E1165 Type 2 diabetes mellitus with hyperglycemia: Secondary | ICD-10-CM | POA: Diagnosis not present

## 2018-02-19 DIAGNOSIS — E782 Mixed hyperlipidemia: Secondary | ICD-10-CM | POA: Diagnosis not present

## 2018-02-19 DIAGNOSIS — I10 Essential (primary) hypertension: Secondary | ICD-10-CM | POA: Diagnosis not present

## 2018-02-19 DIAGNOSIS — D509 Iron deficiency anemia, unspecified: Secondary | ICD-10-CM | POA: Diagnosis not present

## 2018-02-19 DIAGNOSIS — I2510 Atherosclerotic heart disease of native coronary artery without angina pectoris: Secondary | ICD-10-CM | POA: Diagnosis not present

## 2018-02-26 DIAGNOSIS — Z23 Encounter for immunization: Secondary | ICD-10-CM | POA: Diagnosis not present

## 2018-02-26 DIAGNOSIS — I2510 Atherosclerotic heart disease of native coronary artery without angina pectoris: Secondary | ICD-10-CM | POA: Diagnosis not present

## 2018-02-26 DIAGNOSIS — I129 Hypertensive chronic kidney disease with stage 1 through stage 4 chronic kidney disease, or unspecified chronic kidney disease: Secondary | ICD-10-CM | POA: Diagnosis not present

## 2018-02-26 DIAGNOSIS — I429 Cardiomyopathy, unspecified: Secondary | ICD-10-CM | POA: Diagnosis not present

## 2018-02-26 DIAGNOSIS — Z0000 Encounter for general adult medical examination without abnormal findings: Secondary | ICD-10-CM | POA: Diagnosis not present

## 2018-02-26 DIAGNOSIS — E782 Mixed hyperlipidemia: Secondary | ICD-10-CM | POA: Diagnosis not present

## 2018-02-26 DIAGNOSIS — E875 Hyperkalemia: Secondary | ICD-10-CM | POA: Diagnosis not present

## 2018-02-26 DIAGNOSIS — E1165 Type 2 diabetes mellitus with hyperglycemia: Secondary | ICD-10-CM | POA: Diagnosis not present

## 2018-02-26 DIAGNOSIS — I5022 Chronic systolic (congestive) heart failure: Secondary | ICD-10-CM | POA: Diagnosis not present

## 2018-02-27 ENCOUNTER — Ambulatory Visit (INDEPENDENT_AMBULATORY_CARE_PROVIDER_SITE_OTHER): Payer: Medicare HMO | Admitting: *Deleted

## 2018-02-27 DIAGNOSIS — I059 Rheumatic mitral valve disease, unspecified: Secondary | ICD-10-CM | POA: Diagnosis not present

## 2018-02-27 DIAGNOSIS — Z5181 Encounter for therapeutic drug level monitoring: Secondary | ICD-10-CM | POA: Diagnosis not present

## 2018-02-27 LAB — POCT INR: INR: 2.3 (ref 2.0–3.0)

## 2018-02-27 NOTE — Patient Instructions (Signed)
Description   Take an extra 1/2 tablet today, then start taking 1.5 tablets everyday.  Do not change green leafy vegetable intake.  Recheck INR in 2 weeks.   Call our office if you have any questions [phone]

## 2018-03-27 ENCOUNTER — Ambulatory Visit (INDEPENDENT_AMBULATORY_CARE_PROVIDER_SITE_OTHER): Payer: Medicare HMO

## 2018-03-27 DIAGNOSIS — I059 Rheumatic mitral valve disease, unspecified: Secondary | ICD-10-CM

## 2018-03-27 DIAGNOSIS — Z5181 Encounter for therapeutic drug level monitoring: Secondary | ICD-10-CM | POA: Diagnosis not present

## 2018-03-27 LAB — POCT INR: INR: 1.4 — AB (ref 2.0–3.0)

## 2018-03-27 NOTE — Patient Instructions (Signed)
Description   Take 2 tablets for the next 2 days, then start taking 1.5 tablets everyday except 2 tablets on Mondays.  Do not change green leafy vegetable intake.  Recheck INR in 2 weeks.   Call our office if you have any questions [phone]

## 2018-04-10 ENCOUNTER — Encounter (INDEPENDENT_AMBULATORY_CARE_PROVIDER_SITE_OTHER): Payer: Self-pay

## 2018-04-10 ENCOUNTER — Ambulatory Visit (INDEPENDENT_AMBULATORY_CARE_PROVIDER_SITE_OTHER): Payer: Medicare HMO | Admitting: *Deleted

## 2018-04-10 DIAGNOSIS — I059 Rheumatic mitral valve disease, unspecified: Secondary | ICD-10-CM | POA: Diagnosis not present

## 2018-04-10 DIAGNOSIS — Z5181 Encounter for therapeutic drug level monitoring: Secondary | ICD-10-CM

## 2018-04-10 LAB — POCT INR: INR: 2.2 (ref 2.0–3.0)

## 2018-04-10 MED ORDER — WARFARIN SODIUM 5 MG PO TABS: ORAL_TABLET | ORAL | 1 refills | Status: DC

## 2018-04-10 NOTE — Patient Instructions (Signed)
Description   When you get home take another 1/2 tablet then start taking 1.5 tablets everyday except 2 tablets on Mondays and Fridays.  Do not change green leafy vegetable intake.  Recheck INR in 2 weeks.   Call our office if you have any questions [phone]

## 2018-04-24 ENCOUNTER — Ambulatory Visit (INDEPENDENT_AMBULATORY_CARE_PROVIDER_SITE_OTHER): Payer: Medicare Other | Admitting: Pharmacist

## 2018-04-24 DIAGNOSIS — I059 Rheumatic mitral valve disease, unspecified: Secondary | ICD-10-CM

## 2018-04-24 DIAGNOSIS — Z5181 Encounter for therapeutic drug level monitoring: Secondary | ICD-10-CM

## 2018-04-24 LAB — POCT INR: INR: 2.2 (ref 2.0–3.0)

## 2018-04-24 NOTE — Patient Instructions (Signed)
Description   Take 2 tablets today, then start taking 1.5 tablets everyday except 2 tablets on Mondays, Wednesdays and Fridays.  Do not change green leafy vegetable intake.  Recheck INR in 2 weeks.   Call our office if you have any questions [phone]

## 2018-05-08 ENCOUNTER — Ambulatory Visit (INDEPENDENT_AMBULATORY_CARE_PROVIDER_SITE_OTHER): Payer: Medicare Other

## 2018-05-08 DIAGNOSIS — Z5181 Encounter for therapeutic drug level monitoring: Secondary | ICD-10-CM | POA: Diagnosis not present

## 2018-05-08 DIAGNOSIS — I059 Rheumatic mitral valve disease, unspecified: Secondary | ICD-10-CM | POA: Diagnosis not present

## 2018-05-08 LAB — POCT INR: INR: 1.8 — AB (ref 2.0–3.0)

## 2018-05-08 NOTE — Patient Instructions (Signed)
Take 2 tablets today, 2.5 tablets tomorrow, then START NEW DOSAGE of 2 tablets everyday except 1.5 TABLETS ON TUESDAYS, THURSDAYS & SATURDAYS.   Do not change green leafy vegetable intake.   Recheck INR in 2 weeks.   Call our office if you have any questions [phone]

## 2018-05-22 ENCOUNTER — Ambulatory Visit (INDEPENDENT_AMBULATORY_CARE_PROVIDER_SITE_OTHER): Payer: Medicare Other | Admitting: *Deleted

## 2018-05-22 DIAGNOSIS — I059 Rheumatic mitral valve disease, unspecified: Secondary | ICD-10-CM

## 2018-05-22 DIAGNOSIS — Z5181 Encounter for therapeutic drug level monitoring: Secondary | ICD-10-CM | POA: Diagnosis not present

## 2018-05-22 LAB — POCT INR: INR: 1.5 — AB (ref 2.0–3.0)

## 2018-05-22 NOTE — Patient Instructions (Signed)
Description   Take 2 tablets today, 2.5 tablets tomorrow, then START NEW DOSAGE of 2 tablets everyday except 1.5 TABLETS ON TUESDAYS and THURSDAYS.  Do not change green leafy vegetable intake.  Recheck INR in 2 weeks.   Call our office if you have any questions [phone]

## 2018-05-29 DIAGNOSIS — M25531 Pain in right wrist: Secondary | ICD-10-CM | POA: Diagnosis not present

## 2018-05-29 DIAGNOSIS — E1165 Type 2 diabetes mellitus with hyperglycemia: Secondary | ICD-10-CM | POA: Diagnosis not present

## 2018-05-29 DIAGNOSIS — E782 Mixed hyperlipidemia: Secondary | ICD-10-CM | POA: Diagnosis not present

## 2018-05-29 DIAGNOSIS — I429 Cardiomyopathy, unspecified: Secondary | ICD-10-CM | POA: Diagnosis not present

## 2018-06-11 ENCOUNTER — Ambulatory Visit (HOSPITAL_COMMUNITY)
Admission: RE | Admit: 2018-06-11 | Discharge: 2018-06-11 | Disposition: A | Discharge status: Home / Self Care | Payer: Medicare Other | Source: Ambulatory Visit | Attending: Internal Medicine | Admitting: Internal Medicine

## 2018-06-11 ENCOUNTER — Ambulatory Visit (INDEPENDENT_AMBULATORY_CARE_PROVIDER_SITE_OTHER): Payer: Medicare Other | Admitting: *Deleted

## 2018-06-11 VITALS — BP 164/86 | HR 73 | Wt 166.8 lb

## 2018-06-11 DIAGNOSIS — E785 Hyperlipidemia, unspecified: Secondary | ICD-10-CM | POA: Diagnosis not present

## 2018-06-11 DIAGNOSIS — Z952 Presence of prosthetic heart valve: Secondary | ICD-10-CM | POA: Diagnosis not present

## 2018-06-11 DIAGNOSIS — I428 Other cardiomyopathies: Secondary | ICD-10-CM | POA: Diagnosis not present

## 2018-06-11 DIAGNOSIS — I5022 Chronic systolic (congestive) heart failure: Secondary | ICD-10-CM | POA: Insufficient documentation

## 2018-06-11 DIAGNOSIS — I10 Essential (primary) hypertension: Secondary | ICD-10-CM

## 2018-06-11 DIAGNOSIS — Z79899 Other long term (current) drug therapy: Secondary | ICD-10-CM | POA: Insufficient documentation

## 2018-06-11 DIAGNOSIS — I350 Nonrheumatic aortic (valve) stenosis: Secondary | ICD-10-CM | POA: Insufficient documentation

## 2018-06-11 DIAGNOSIS — I130 Hypertensive heart and chronic kidney disease with heart failure and stage 1 through stage 4 chronic kidney disease, or unspecified chronic kidney disease: Secondary | ICD-10-CM | POA: Diagnosis not present

## 2018-06-11 DIAGNOSIS — Z7982 Long term (current) use of aspirin: Secondary | ICD-10-CM | POA: Diagnosis not present

## 2018-06-11 DIAGNOSIS — Z7901 Long term (current) use of anticoagulants: Secondary | ICD-10-CM | POA: Insufficient documentation

## 2018-06-11 DIAGNOSIS — I2510 Atherosclerotic heart disease of native coronary artery without angina pectoris: Secondary | ICD-10-CM | POA: Diagnosis not present

## 2018-06-11 DIAGNOSIS — N183 Chronic kidney disease, stage 3 (moderate): Secondary | ICD-10-CM | POA: Insufficient documentation

## 2018-06-11 DIAGNOSIS — E1122 Type 2 diabetes mellitus with diabetic chronic kidney disease: Secondary | ICD-10-CM | POA: Diagnosis not present

## 2018-06-11 LAB — BASIC METABOLIC PANEL
Anion gap: 7 (ref 5–15)
BUN: 18 mg/dL (ref 8–23)
CO2: 26 mmol/L (ref 22–32)
Calcium: 8.9 mg/dL (ref 8.9–10.3)
Chloride: 106 mmol/L (ref 98–111)
Creatinine, Ser: 1.12 mg/dL — ABNORMAL HIGH (ref 0.44–1.00)
GFR calc Af Amer: 56 mL/min — ABNORMAL LOW (ref >60)
GFR calc non Af Amer: 48 mL/min — ABNORMAL LOW (ref >60)
Glucose, Bld: 111 mg/dL — ABNORMAL HIGH (ref 70–99)
Potassium: 4.2 mmol/L (ref 3.5–5.1)
SODIUM: 139 mmol/L (ref 135–145)

## 2018-06-11 LAB — CUP PACEART REMOTE DEVICE CHECK
Battery Remaining Longevity: 77 mo
Battery Remaining Percentage: 83 %
Battery Voltage: 3.01 V
Brady Statistic RV Percent Paced: 1 %
Date Time Interrogation Session: 20200302153324
HighPow Impedance: 42 Ohm
HighPow Impedance: 42 Ohm
Implantable Lead Implant Date: 20090410
Implantable Lead Location: 753860
Implantable Lead Model: 7120
Implantable Pulse Generator Implant Date: 20180423
Lead Channel Impedance Value: 480 Ohm
Lead Channel Pacing Threshold Amplitude: 0.5 V
Lead Channel Pacing Threshold Pulse Width: 0.5 ms
Lead Channel Sensing Intrinsic Amplitude: 11.7 mV
Lead Channel Setting Pacing Amplitude: 2.5 V
Lead Channel Setting Pacing Pulse Width: 0.5 ms
Lead Channel Setting Sensing Sensitivity: 0.5 mV
Pulse Gen Serial Number: 7423042

## 2018-06-11 LAB — DIGOXIN LEVEL

## 2018-06-11 NOTE — Progress Notes (Signed)
[facility]   Patient ID: Deborah [name], female   DOB: July 22, 1945, 73 y.o.   MRN: [id number] PCP: Petra Kuba HF MD:   HPI: Deborah [name] is a 73 y.o. female with a history of congestive heart failure secondary to nonischemic cardiomyopathy, ejection fraction 25% s/p St Jude single chamber ICD.  She also has a history of mitral valve disease and status post mitral valve replacement with a St. Jude valve in 2005.  Remainder of her history is notable for diabetes, hypertension, hyperlipidemia, and kidney stones.   CPX performed June 2011 pVO2 16.8 slope 26.1 RER 1.11 O2 pulse 90% predicted O2 sat 87% at peak with possible hypoventilation.  05/12/11 ECHO 20-25%.  Mechanical MVR appears normal.   03/19/13 Echo 25% Mechanical MVR appears normal.   12/04/14 Echo EF 20-25% 08/10/16 EF 20% MVR ok   She returns today for HF follow up. Last seen 04/2017 and Sherryll Burger was increased at that time. Saw Dr Graciela Husbands 01/2018 and coreg was decreased due to dizziness. Overall doing fine. Denies SOB. Does all her housework and grocery store shopping with no problems. Does not do stairs. No BLE edema, orthopnea, or PND. No dizziness. No bleeding on coumadin. Energy level is good. No palpitations. Did not take medications yet today. Has not taken PRN lasix. Does not check BP at home.   ICD interrogation: One episode of NSVT, 4 seconds long in October. No VT/VF. Thoracic impedence above threshold.   Review of systems complete and found to be negative unless listed in HPI.   Past Medical History:  Diagnosis Date  . Aortic stenosis, mild    mean gradient echo 09/09  . CHF (congestive heart failure) (HCC)    --Non-ischemic CM EF 30-35% by echo 9/09 (Previous 25%)  --s/p St. Jude single chamber ICD 04/09 --Minimal  CAD by cath 2004 LAD 20% LCX 20% Ramus 30% RCA nl --echo 05/11 EF 20-25%  . Diabetes mellitus    type 2  . Hyperlipidemia   . Hypertension   . Kidney stones   . MR (congenital mitral  regurgitation)    Severe MR s/p St. Jude mechanical MVR by Gasper Lloyd 2005    Current Outpatient Medications  Medication Sig Dispense Refill  . carvedilol (COREG) 25 MG tablet Take 0.5 tablets (12.5 mg total) by mouth 2 (two) times daily with a meal. 180 tablet 1  . Choline Fenofibrate (TRILIPIX) 135 MG capsule Take 1 capsule (135 mg total) by mouth daily. 90 capsule 1  . HYDROcodone-acetaminophen (NORCO) 7.5-325 MG tablet Take 1 tablet by mouth 2 (two) times daily as needed for moderate pain.     Marland Kitchen ondansetron (ZOFRAN) 4 MG tablet Take 1 tablet (4 mg total) by mouth every 6 (six) hours as needed for nausea. 20 tablet 0  . sacubitril-valsartan (ENTRESTO) 97-103 MG Take 1 tablet by mouth 2 (two) times daily. 60 tablet 3  . temazepam (RESTORIL) 15 MG capsule Take 15 mg by mouth at bedtime as needed for sleep.    Marland Kitchen warfarin (COUMADIN) 5 MG tablet Take as directed by coumadin clinic 150 tablet 1  . aspirin EC 81 MG tablet Take 81 mg by mouth daily.    Marland Kitchen atorvastatin (LIPITOR) 40 MG tablet Take 1 tablet (40 mg total) by mouth daily. (Patient not taking: Reported on 06/11/2018) 90 tablet 1   No current facility-administered medications for this encounter.      PHYSICAL EXAM: Vitals:   06/11/18 1034  BP: (!) 164/86  Pulse: 73  SpO2: 98%  Weight: 75.7 kg (166 lb 12.8 oz)    Wt Readings from Last 3 Encounters:  06/11/18 75.7 kg (166 lb 12.8 oz)  01/23/18 71.8 kg (158 lb 6.4 oz)  01/12/18 76.7 kg (169 lb)   General: Well appearing. No resp difficulty. HEENT: Normal Neck: Supple. JVP 5-6. Carotids 2+ bilat; no bruits. No thyromegaly or nodule noted. Cor: PMI nondisplaced. RRR, mechanical s1 Lungs: CTAB, normal effort. Abdomen: Soft, non-tender, non-distended, no HSM. No bruits or masses. +BS  Extremities: No cyanosis, clubbing, or rash. R and LLE no edema.  Neuro: Alert & orientedx3, cranial nerves grossly intact. moves all 4 extremities w/o difficulty. Affect pleasant   ASSESSMENT &  PLAN:  1. Chronic Systolic Heart Failure, NICM EF 20-25% in 11/2014, St. Jude ICD NYHA III.  - Echo 5/18. EF 20% - Doing well. NYHA II. Volume status stable on exam and on device.  - Takes lasix PRN only  - Continue Entresto 97/103 mg BID - Continue Coreg 12.5 mg BID.  - Continue digoxin. Check dig level today. - K 5.0 on most recent labs. Will not add spiro today. Check BMET - Repeat echo.   2. S/P MVR - on coumadin - INR followed by [facility]. Office. Goal 2.5-3.0 Continue ASA 81. She has not been taking. Resume 81 mg ASA daily.  - Reminded her of SBE prophylaxis.  3. HTN - Elevated today, but has not taken medications yet.  4. CKD stage III: - Check BMET. 5. NICM s/p St. Jude ICD.  - followed by Dr. Graciela Husbands. No change.  Resume ASA BMET, dig level today Repeat echo  Alford Highland, NP 11:02 AM   Patient seen and examined with the above-signed Advanced Practice Provider and/or Housestaff. I personally reviewed laboratory data, imaging studies and relevant notes. I independently examined the patient and formulated the important aspects of the plan. I have edited the note to reflect any of my changes or salient points. I have personally discussed the plan with the patient and/or family.  Overall doing well despite low EF. NYHA II. Volume status stable. BP high her but has been well controlled. Tolerating warfarin well without bleeding. Agree with ECASA 81 in the setting of mechanical MVR. Will check labs and echo.  Arvilla Meres, MD  12:06 PM

## 2018-06-11 NOTE — Patient Instructions (Signed)
Restart taking Aspirin 81 mg daily  Labs done today  Your physician has requested that you have an echocardiogram. Echocardiography is a painless test that uses sound waves to create images of your heart. It provides your doctor with information about the size and shape of your heart and how well your heart's chambers and valves are working. This procedure takes approximately one hour. There are no restrictions for this procedure.  THIS WILL BE DONE AT [facility] ON [address], THEY WILL CALL YOU TO SCHEDULE  We will contact you in 1 year to schedule your next appointment.

## 2018-06-15 ENCOUNTER — Ambulatory Visit (INDEPENDENT_AMBULATORY_CARE_PROVIDER_SITE_OTHER): Payer: Medicare Other

## 2018-06-15 DIAGNOSIS — Z5181 Encounter for therapeutic drug level monitoring: Secondary | ICD-10-CM

## 2018-06-15 DIAGNOSIS — I059 Rheumatic mitral valve disease, unspecified: Secondary | ICD-10-CM | POA: Diagnosis not present

## 2018-06-15 LAB — POCT INR: INR: 2.2 (ref 2.0–3.0)

## 2018-06-15 NOTE — Patient Instructions (Signed)
Description   Take an extra 1/2 tablet today, take 2 tablets tomorrow, then start taking 1.5 tablets everyday except 2 tablets on Mondays and Fridays.  Do not change green leafy vegetable intake.  Recheck INR in 2 weeks.   Call our office if you have any questions [phone]

## 2018-06-18 ENCOUNTER — Ambulatory Visit (HOSPITAL_COMMUNITY): Payer: Medicare Other | Attending: Cardiology

## 2018-06-18 DIAGNOSIS — I5022 Chronic systolic (congestive) heart failure: Secondary | ICD-10-CM

## 2018-06-18 NOTE — Progress Notes (Signed)
Remote ICD transmission.   

## 2018-07-11 ENCOUNTER — Other Ambulatory Visit (HOSPITAL_COMMUNITY): Payer: Self-pay | Admitting: Vascular Surgery

## 2018-07-11 MED ORDER — SACUBITRIL-VALSARTAN 97-103 MG PO TABS: 1.0000 | ORAL_TABLET | Freq: Two times a day (BID) | ORAL | 6 refills | Status: DC

## 2018-07-11 NOTE — Telephone Encounter (Signed)
Refill on Entresto

## 2018-07-11 NOTE — Telephone Encounter (Signed)
Refill for [facility] sent

## 2018-07-24 ENCOUNTER — Telehealth: Payer: Self-pay

## 2018-07-24 NOTE — Telephone Encounter (Signed)

## 2018-07-25 ENCOUNTER — Other Ambulatory Visit: Payer: Self-pay

## 2018-07-25 ENCOUNTER — Ambulatory Visit (INDEPENDENT_AMBULATORY_CARE_PROVIDER_SITE_OTHER): Payer: Medicare Other | Admitting: *Deleted

## 2018-07-25 DIAGNOSIS — Z5181 Encounter for therapeutic drug level monitoring: Secondary | ICD-10-CM

## 2018-07-25 DIAGNOSIS — I059 Rheumatic mitral valve disease, unspecified: Secondary | ICD-10-CM

## 2018-07-25 LAB — POCT INR: INR: 1.8 — AB (ref 2.0–3.0)

## 2018-08-29 ENCOUNTER — Telehealth: Payer: Self-pay

## 2018-08-29 NOTE — Telephone Encounter (Signed)

## 2018-08-31 DIAGNOSIS — E1165 Type 2 diabetes mellitus with hyperglycemia: Secondary | ICD-10-CM | POA: Diagnosis not present

## 2018-08-31 DIAGNOSIS — E782 Mixed hyperlipidemia: Secondary | ICD-10-CM | POA: Diagnosis not present

## 2018-08-31 DIAGNOSIS — I429 Cardiomyopathy, unspecified: Secondary | ICD-10-CM | POA: Diagnosis not present

## 2018-09-06 DIAGNOSIS — I5022 Chronic systolic (congestive) heart failure: Secondary | ICD-10-CM | POA: Diagnosis not present

## 2018-09-06 DIAGNOSIS — I10 Essential (primary) hypertension: Secondary | ICD-10-CM | POA: Diagnosis not present

## 2018-09-06 DIAGNOSIS — E782 Mixed hyperlipidemia: Secondary | ICD-10-CM | POA: Diagnosis not present

## 2018-09-06 DIAGNOSIS — E1165 Type 2 diabetes mellitus with hyperglycemia: Secondary | ICD-10-CM | POA: Diagnosis not present

## 2018-09-07 ENCOUNTER — Telehealth: Payer: Self-pay

## 2018-09-07 NOTE — Telephone Encounter (Signed)
Unable to lmom regarding the prescreen and the inoffice visit and that the drive thru is over

## 2018-09-10 ENCOUNTER — Ambulatory Visit (INDEPENDENT_AMBULATORY_CARE_PROVIDER_SITE_OTHER): Payer: Medicare Other | Admitting: *Deleted

## 2018-09-10 ENCOUNTER — Encounter (INDEPENDENT_AMBULATORY_CARE_PROVIDER_SITE_OTHER): Payer: Self-pay

## 2018-09-10 ENCOUNTER — Encounter: Payer: Medicare Other | Admitting: *Deleted

## 2018-09-10 ENCOUNTER — Other Ambulatory Visit: Payer: Self-pay

## 2018-09-10 DIAGNOSIS — Z5181 Encounter for therapeutic drug level monitoring: Secondary | ICD-10-CM

## 2018-09-10 DIAGNOSIS — I059 Rheumatic mitral valve disease, unspecified: Secondary | ICD-10-CM

## 2018-09-10 LAB — POCT INR: INR: 2.1 (ref 2.0–3.0)

## 2018-09-10 NOTE — Patient Instructions (Addendum)
  Description   Today take 2.5 tablets then start taking 2 tablets everyday except 1.5 tablets Sunday, Tuesday and Thursday.  Do not change green leafy vegetable intake.  Recheck INR in 3 weeks.   Call our office if you have any questions [phone]

## 2018-09-11 ENCOUNTER — Telehealth: Payer: Self-pay

## 2018-09-11 NOTE — Telephone Encounter (Signed)
Spoke with patient to remind of missed remote transmission 

## 2018-09-18 ENCOUNTER — Encounter: Payer: Self-pay | Admitting: Cardiology

## 2018-09-18 NOTE — Progress Notes (Signed)
Letter  

## 2018-09-25 ENCOUNTER — Telehealth: Payer: Self-pay

## 2018-09-25 NOTE — Telephone Encounter (Signed)

## 2018-10-02 ENCOUNTER — Other Ambulatory Visit: Payer: Self-pay

## 2018-10-02 ENCOUNTER — Ambulatory Visit (INDEPENDENT_AMBULATORY_CARE_PROVIDER_SITE_OTHER): Payer: Medicare Other | Admitting: Pharmacist

## 2018-10-02 DIAGNOSIS — I059 Rheumatic mitral valve disease, unspecified: Secondary | ICD-10-CM

## 2018-10-02 DIAGNOSIS — Z5181 Encounter for therapeutic drug level monitoring: Secondary | ICD-10-CM

## 2018-10-02 LAB — POCT INR: INR: 2.9 (ref 2.0–3.0)

## 2018-10-02 NOTE — Patient Instructions (Signed)
Continue taking 2 tablets everyday except 1.5 tablets Sunday, Tuesday and Thursday.  Do not change green leafy vegetable intake.  Recheck INR in 4 weeks.   Call our office if you have any questions [phone]

## 2018-10-06 ENCOUNTER — Other Ambulatory Visit: Payer: Self-pay | Admitting: Cardiology

## 2018-10-29 ENCOUNTER — Telehealth: Payer: Self-pay

## 2018-10-29 NOTE — Telephone Encounter (Signed)

## 2018-10-30 ENCOUNTER — Other Ambulatory Visit: Payer: Self-pay

## 2018-10-30 ENCOUNTER — Ambulatory Visit (INDEPENDENT_AMBULATORY_CARE_PROVIDER_SITE_OTHER): Payer: Medicare Other

## 2018-10-30 DIAGNOSIS — I059 Rheumatic mitral valve disease, unspecified: Secondary | ICD-10-CM

## 2018-10-30 DIAGNOSIS — Z5181 Encounter for therapeutic drug level monitoring: Secondary | ICD-10-CM

## 2018-10-30 LAB — POCT INR: INR: 2.6 (ref 2.0–3.0)

## 2018-10-30 NOTE — Patient Instructions (Signed)
Description   Continue on same dosage 2 tablets everyday except 1.5 tablets Sundays, Tuesdays and Thursdays.  Do not change green leafy vegetable intake.  Recheck INR in 5 weeks.   Call our office if you have any questions [phone]

## 2018-12-04 ENCOUNTER — Ambulatory Visit (INDEPENDENT_AMBULATORY_CARE_PROVIDER_SITE_OTHER): Payer: Medicare Other | Admitting: *Deleted

## 2018-12-04 ENCOUNTER — Other Ambulatory Visit: Payer: Self-pay

## 2018-12-04 DIAGNOSIS — I059 Rheumatic mitral valve disease, unspecified: Secondary | ICD-10-CM

## 2018-12-04 DIAGNOSIS — Z5181 Encounter for therapeutic drug level monitoring: Secondary | ICD-10-CM

## 2018-12-04 LAB — POCT INR: INR: 2.1 (ref 2.0–3.0)

## 2018-12-04 NOTE — Patient Instructions (Signed)
Description   Today take 2 tablets then continue on same dosage 2 tablets everyday except 1.5 tablets Sundays, Tuesdays and Thursdays.  Do not change green leafy vegetable intake.  Recheck INR in 4 weeks.   Call our office if you have any questions [phone]

## 2019-01-01 ENCOUNTER — Other Ambulatory Visit: Payer: Self-pay

## 2019-01-01 ENCOUNTER — Ambulatory Visit (INDEPENDENT_AMBULATORY_CARE_PROVIDER_SITE_OTHER): Payer: Medicare Other | Admitting: *Deleted

## 2019-01-01 DIAGNOSIS — Z5181 Encounter for therapeutic drug level monitoring: Secondary | ICD-10-CM

## 2019-01-01 DIAGNOSIS — I059 Rheumatic mitral valve disease, unspecified: Secondary | ICD-10-CM

## 2019-01-01 LAB — POCT INR: INR: 1.9 — AB (ref 2.0–3.0)

## 2019-01-01 NOTE — Progress Notes (Signed)
Pt asked about taking hair and nail growth supplement- she had not started taking it.  Reviewed supplement with Pharm D, who stated she would not recommend taking supplement because it has ingredients that would increase her risks of bleeding.

## 2019-01-01 NOTE — Patient Instructions (Addendum)
Description   Take an extra 1/2 tablet today, then start taking 2 tablets daily except for 1.5 tablets on Sunday and Thursday.  Recheck INR in 2 weeks.   Call our office if you have any questions [phone]

## 2019-01-17 ENCOUNTER — Other Ambulatory Visit: Payer: Self-pay

## 2019-01-17 ENCOUNTER — Ambulatory Visit (INDEPENDENT_AMBULATORY_CARE_PROVIDER_SITE_OTHER): Payer: Medicare Other | Admitting: *Deleted

## 2019-01-17 DIAGNOSIS — I059 Rheumatic mitral valve disease, unspecified: Secondary | ICD-10-CM | POA: Diagnosis not present

## 2019-01-17 DIAGNOSIS — Z5181 Encounter for therapeutic drug level monitoring: Secondary | ICD-10-CM | POA: Diagnosis not present

## 2019-01-17 LAB — POCT INR: INR: 3.5 — AB (ref 2.0–3.0)

## 2019-01-17 NOTE — Patient Instructions (Signed)
Description   Continue taking 2 tablets daily except for 1.5 tablets on Sunday and Thursday.  Recheck INR in 3 weeks. Call our office if you have any questions [phone]

## 2019-01-22 ENCOUNTER — Encounter: Payer: Medicare Other | Admitting: Internal Medicine

## 2019-01-31 NOTE — Progress Notes (Signed)
Electrophysiology Office Note Date: 02/01/2019  ID:  Deborah, [name] [birth year], MRN [id number]  PCP: Georgianne Fick, MD Primary Cardiologist: Dr. Gala Romney  Electrophysiologist: Dr. Graciela Husbands   CC: Routine ICD follow-up  Deborah [name] is a 73 y.o. female seen today for Dr. Graciela Husbands.  They present today for routine electrophysiology followup.  Since last being seen in our clinic, the patient reports doing very well. They deny chest pain, palpitations, dyspnea, PND, orthopnea, nausea, vomiting, dizziness, syncope, edema, weight gain, or early satiety.  She has not had ICD shocks.   Device History: St. Jude Single Chamber ICD implanted 07/2007, gen change 08/01/16 for Chronic systolic CHF, NICM History of appropriate therapy: No History of AAD therapy: No   Past Medical History:  Diagnosis Date  . Aortic stenosis, mild    mean gradient echo 09/09  . CHF (congestive heart failure) (HCC)    --Non-ischemic CM EF 30-35% by echo 9/09 (Previous 25%)  --s/p St. Jude single chamber ICD 04/09 --Minimal  CAD by cath 2004 LAD 20% LCX 20% Ramus 30% RCA nl --echo 05/11 EF 20-25%  . Diabetes mellitus    type 2  . Hyperlipidemia   . Hypertension   . Kidney stones   . MR (congenital mitral regurgitation)    Severe MR s/p St. Jude mechanical MVR by Gasper Lloyd 2005   Past Surgical History:  Procedure Laterality Date  . APPENDECTOMY  1979  . CARDIAC DEFIBRILLATOR PLACEMENT  2009  . CARDIAC VALVE SURGERY  2005  . ESOPHAGOGASTRODUODENOSCOPY N/A 12/05/2014   Procedure: ESOPHAGOGASTRODUODENOSCOPY (EGD);  Surgeon: Dorena Cookey, MD;  Location: [facility] ENDOSCOPY;  Service: Endoscopy;  Laterality: N/A;  . ICD GENERATOR CHANGEOUT N/A 08/01/2016   Procedure: ICD Generator Changeout;  Surgeon: Duke Salvia, MD;  Location: [facility] INVASIVE CV LAB;  Service: Cardiovascular;  Laterality: N/A;  . INSERT / REPLACE / REMOVE PACEMAKER      Current Outpatient Medications  Medication Sig Dispense Refill  .  aspirin EC 81 MG tablet Take 81 mg by mouth daily.    . carvedilol (COREG) 25 MG tablet Take 25 mg by mouth 2 (two) times daily with a meal.    . HYDROcodone-acetaminophen (NORCO) 7.5-325 MG tablet Take 1 tablet by mouth 2 (two) times daily as needed for moderate pain.     Marland Kitchen ondansetron (ZOFRAN) 4 MG tablet Take 1 tablet (4 mg total) by mouth every 6 (six) hours as needed for nausea. 20 tablet 0  . sacubitril-valsartan (ENTRESTO) 97-103 MG Take 1 tablet by mouth 2 (two) times daily. 60 tablet 6  . temazepam (RESTORIL) 15 MG capsule Take 15 mg by mouth at bedtime as needed for sleep.    Marland Kitchen warfarin (COUMADIN) 5 MG tablet TAKE AS DIRECTED BY COUMADIN CLINIC 160 tablet 1   No current facility-administered medications for this visit.     Allergies:   Strawberry extract   Social History: Social History   Socioeconomic History  . Marital status: Single    Spouse name: Not on file  . Number of children: Not on file  . Years of education: Not on file  . Highest education level: Not on file  Occupational History  . Occupation: Scientist, research (medical): RETIRED    Comment: disabled  Social Needs  . Financial resource strain: Not on file  . Food insecurity    Worry: Not on file    Inability: Not on file  . Transportation needs    Medical: Not  on file    Non-medical: Not on file  Tobacco Use  . Smoking status: Former Smoker    Quit date: 06/16/1984    Years since quitting: 34.6  . Smokeless tobacco: Never Used  Substance and Sexual Activity  . Alcohol use: No  . Drug use: No  . Sexual activity: Not on file  Lifestyle  . Physical activity    Days per week: Not on file    Minutes per session: Not on file  . Stress: Not on file  Relationships  . Social Herbalist on phone: Not on file    Gets together: Not on file    Attends religious service: Not on file    Active member of club or organization: Not on file    Attends meetings of clubs or organizations: Not on file     Relationship status: Not on file  . Intimate partner violence    Fear of current or ex partner: Not on file    Emotionally abused: Not on file    Physically abused: Not on file    Forced sexual activity: Not on file  Other Topics Concern  . Not on file  Social History Narrative   Former smoker, started in early 20's - less than 1 ppd / Quit in her late 20's   Pt is single   Pt has one child, a son   Pt lives alone   Pt is disabled, previously worked as Quarry manager x 12 years    Family History: Family History  Problem Relation Age of Onset  . Heart disease Mother     Review of Systems: All other systems reviewed and are otherwise negative except as noted above.   Physical Exam: Vitals:   02/01/19 1320  BP: 118/76  Pulse: 69  Weight: 171 lb (77.6 kg)  Height: 5\' 3"  (1.6 m)     GEN- The patient is well appearing, alert and oriented x 3 today.   HEENT: normocephalic, atraumatic; sclera clear, conjunctiva pink; hearing intact; oropharynx clear; neck supple, no JVP Lymph- no cervical lymphadenopathy Lungs- Clear to ausculation bilaterally, normal work of breathing.  No wheezes, rales, rhonchi Heart- Regular rate and rhythm, no murmurs, rubs or gallops, PMI not laterally displaced GI- soft, non-tender, non-distended, bowel sounds present, no hepatosplenomegaly Extremities- no clubbing, cyanosis, or edema; DP/PT/radial pulses 2+ bilaterally MS- no significant deformity or atrophy Skin- warm and dry, no rash or lesion; ICD pocket well healed Psych- euthymic mood, full affect Neuro- strength and sensation are intact  ICD interrogation- reviewed in detail today,  See PACEART report  EKG:  EKG is ordered today. The ekg ordered today shows NSR at 69 bpm, QRS 138 ms, PR interval 156 ms.  Recent Labs: 06/11/2018: BUN 18; Creatinine, Ser 1.12; Potassium 4.2; Sodium 139   Wt Readings from Last 3 Encounters:  02/01/19 171 lb (77.6 kg)  06/11/18 166 lb 12.8 oz (75.7 kg)  01/23/18 158 lb  6.4 oz (71.8 kg)     Other studies Reviewed: Additional studies/ records that were reviewed today include: Echo 06/18/2018 LVEF 20-25%,    Assessment and Plan:  1.  Chronic systolic dysfunction s/p St. Jude single chamber ICD  euvolemic today Stable on an appropriate medical regimen. Will check BMET today. Last BMET 06/2018. Normal ICD function See Claudia Desanctis Art report No changes today  2. HLD She is not sure if she is taking her atorvastatin. She will go home and confirm if she has it, and  talk to her PCP if not.   3. S/p MVR Encouraged compliance with coumadin.  Continue ASA.  4. HTN Stable on current regimen.   Current medicines are reviewed at length with the patient today.   The patient does not have concerns regarding her medicines.  The following changes were made today:  none  Labs/ tests ordered today include:  Orders Placed This Encounter  Procedures  . Basic metabolic panel   Disposition:   Follow up with Dr. Graciela Husbands in 1 year.   Signed, Graciella Freer, PA-C  02/01/2019 1:22 PM  [facility] [address] [place] [address] [phone] (office) [phone] (fax)

## 2019-02-01 ENCOUNTER — Ambulatory Visit (INDEPENDENT_AMBULATORY_CARE_PROVIDER_SITE_OTHER): Payer: Medicare Other | Admitting: Student

## 2019-02-01 ENCOUNTER — Other Ambulatory Visit: Payer: Self-pay

## 2019-02-01 ENCOUNTER — Encounter: Payer: Self-pay | Admitting: Student

## 2019-02-01 VITALS — BP 118/76 | HR 69 | Ht 63.0 in | Wt 171.0 lb

## 2019-02-01 DIAGNOSIS — I10 Essential (primary) hypertension: Secondary | ICD-10-CM

## 2019-02-01 DIAGNOSIS — I5022 Chronic systolic (congestive) heart failure: Secondary | ICD-10-CM

## 2019-02-01 DIAGNOSIS — I059 Rheumatic mitral valve disease, unspecified: Secondary | ICD-10-CM | POA: Diagnosis not present

## 2019-02-01 DIAGNOSIS — Z95810 Presence of automatic (implantable) cardiac defibrillator: Secondary | ICD-10-CM | POA: Diagnosis not present

## 2019-02-01 LAB — CUP PACEART INCLINIC DEVICE CHECK
Battery Remaining Longevity: 73 mo
Brady Statistic RV Percent Paced: 0.17 %
Date Time Interrogation Session: 20201023135817
HighPow Impedance: 41.8036
Implantable Lead Implant Date: 20090410
Implantable Lead Location: 753860
Implantable Lead Model: 7120
Implantable Pulse Generator Implant Date: 20180423
Lead Channel Impedance Value: 450 Ohm
Lead Channel Pacing Threshold Amplitude: 0.75 V
Lead Channel Pacing Threshold Amplitude: 0.75 V
Lead Channel Pacing Threshold Pulse Width: 0.5 ms
Lead Channel Pacing Threshold Pulse Width: 0.5 ms
Lead Channel Sensing Intrinsic Amplitude: 11.7 mV
Lead Channel Setting Pacing Amplitude: 2.5 V
Lead Channel Setting Pacing Pulse Width: 0.5 ms
Lead Channel Setting Sensing Sensitivity: 0.5 mV
Pulse Gen Serial Number: 7423042

## 2019-02-01 NOTE — Addendum Note (Signed)
Addended by: Drue Novel I on: 02/01/2019 01:59 PM   Modules accepted: Orders

## 2019-02-01 NOTE — Patient Instructions (Signed)
Medication Instructions:  Your physician recommends that you continue on your current medications as directed. Please refer to the Current Medication list given to you today.  *If you need a refill on your cardiac medications before your next appointment, please call your pharmacy*  Lab Work: TODAY: BMET  If you have labs (blood work) drawn today and your tests are completely normal, you will receive your results only by: Marland Kitchen MyChart Message (if you have MyChart) OR . A paper copy in the mail If you have any lab test that is abnormal or we need to change your treatment, we will call you to review the results.  Testing/Procedures: None ordered  Follow-Up: At [facility], you and your health needs are our priority.  As part of our continuing mission to provide you with exceptional heart care, we have created designated Provider Care Teams.  These Care Teams include your primary Cardiologist (physician) and Advanced Practice Providers (APPs -  Physician Assistants and Nurse Practitioners) who all work together to provide you with the care you need, when you need it.  Your next appointment:   12 months  The format for your next appointment:   In Person  Provider:   Virl Axe, MD  Other Instructions

## 2019-02-02 LAB — BASIC METABOLIC PANEL
BUN/Creatinine Ratio: 17 (ref 12–28)
BUN: 20 mg/dL (ref 8–27)
CO2: 23 mmol/L (ref 20–29)
Calcium: 8.5 mg/dL — ABNORMAL LOW (ref 8.7–10.3)
Chloride: 106 mmol/L (ref 96–106)
Creatinine, Ser: 1.16 mg/dL — ABNORMAL HIGH (ref 0.57–1.00)
GFR calc Af Amer: 53 mL/min/{1.73_m2} — ABNORMAL LOW (ref >59)
GFR calc non Af Amer: 46 mL/min/{1.73_m2} — ABNORMAL LOW (ref >59)
Glucose: 109 mg/dL — ABNORMAL HIGH (ref 65–99)
Potassium: 4.8 mmol/L (ref 3.5–5.2)
Sodium: 142 mmol/L (ref 134–144)

## 2019-02-08 ENCOUNTER — Ambulatory Visit (INDEPENDENT_AMBULATORY_CARE_PROVIDER_SITE_OTHER): Payer: Medicare Other | Admitting: *Deleted

## 2019-02-08 ENCOUNTER — Other Ambulatory Visit: Payer: Self-pay

## 2019-02-08 DIAGNOSIS — Z5181 Encounter for therapeutic drug level monitoring: Secondary | ICD-10-CM

## 2019-02-08 DIAGNOSIS — I059 Rheumatic mitral valve disease, unspecified: Secondary | ICD-10-CM

## 2019-02-08 LAB — POCT INR: INR: 1.8 — AB (ref 2.0–3.0)

## 2019-02-08 NOTE — Patient Instructions (Signed)
Description   Today take another 1/2 tablet then continue taking 2 tablets daily except for 1.5 tablets on Sunday and Thursday.  Recheck INR in 3 weeks. Call our office if you have any questions [phone]

## 2019-03-01 ENCOUNTER — Ambulatory Visit (INDEPENDENT_AMBULATORY_CARE_PROVIDER_SITE_OTHER): Payer: Medicare Other | Admitting: *Deleted

## 2019-03-01 ENCOUNTER — Other Ambulatory Visit: Payer: Self-pay

## 2019-03-01 DIAGNOSIS — I059 Rheumatic mitral valve disease, unspecified: Secondary | ICD-10-CM | POA: Diagnosis not present

## 2019-03-01 DIAGNOSIS — Z5181 Encounter for therapeutic drug level monitoring: Secondary | ICD-10-CM

## 2019-03-01 LAB — POCT INR: INR: 4.7 — AB (ref 2.0–3.0)

## 2019-03-01 NOTE — Patient Instructions (Signed)
Description   Hold tomorrow and on Sunday take 1/2 a tablet. Then continue taking 2 tablets daily except for 1.5 tablets on Sunday and Thursday.  Recheck INR in 2 weeks. Call our office if you have any questions [phone]

## 2019-03-04 DIAGNOSIS — I10 Essential (primary) hypertension: Secondary | ICD-10-CM | POA: Diagnosis not present

## 2019-03-04 DIAGNOSIS — E1165 Type 2 diabetes mellitus with hyperglycemia: Secondary | ICD-10-CM | POA: Diagnosis not present

## 2019-03-04 DIAGNOSIS — E782 Mixed hyperlipidemia: Secondary | ICD-10-CM | POA: Diagnosis not present

## 2019-03-04 DIAGNOSIS — Z0000 Encounter for general adult medical examination without abnormal findings: Secondary | ICD-10-CM | POA: Diagnosis not present

## 2019-03-04 DIAGNOSIS — I5022 Chronic systolic (congestive) heart failure: Secondary | ICD-10-CM | POA: Diagnosis not present

## 2019-03-04 DIAGNOSIS — Z23 Encounter for immunization: Secondary | ICD-10-CM | POA: Diagnosis not present

## 2019-03-15 ENCOUNTER — Ambulatory Visit (INDEPENDENT_AMBULATORY_CARE_PROVIDER_SITE_OTHER): Payer: Medicare Other | Admitting: *Deleted

## 2019-03-15 ENCOUNTER — Other Ambulatory Visit: Payer: Self-pay

## 2019-03-15 DIAGNOSIS — Z5181 Encounter for therapeutic drug level monitoring: Secondary | ICD-10-CM | POA: Diagnosis not present

## 2019-03-15 DIAGNOSIS — I059 Rheumatic mitral valve disease, unspecified: Secondary | ICD-10-CM | POA: Diagnosis not present

## 2019-03-15 LAB — POCT INR: INR: 4.5 — AB (ref 2.0–3.0)

## 2019-03-15 NOTE — Patient Instructions (Addendum)
  Description   Hold tomorrow and then start taking 1.5 tablets daily except for 2 tablets on Monday, Wednesday and Friday. Recheck INR in 2 weeks. Call our office if you have any questions [phone]

## 2019-03-18 DIAGNOSIS — I5022 Chronic systolic (congestive) heart failure: Secondary | ICD-10-CM | POA: Diagnosis not present

## 2019-03-18 DIAGNOSIS — I130 Hypertensive heart and chronic kidney disease with heart failure and stage 1 through stage 4 chronic kidney disease, or unspecified chronic kidney disease: Secondary | ICD-10-CM | POA: Diagnosis not present

## 2019-03-18 DIAGNOSIS — Z0000 Encounter for general adult medical examination without abnormal findings: Secondary | ICD-10-CM | POA: Diagnosis not present

## 2019-03-18 DIAGNOSIS — E1165 Type 2 diabetes mellitus with hyperglycemia: Secondary | ICD-10-CM | POA: Diagnosis not present

## 2019-03-18 DIAGNOSIS — I429 Cardiomyopathy, unspecified: Secondary | ICD-10-CM | POA: Diagnosis not present

## 2019-03-28 ENCOUNTER — Other Ambulatory Visit: Payer: Self-pay

## 2019-03-28 MED ORDER — WARFARIN SODIUM 5 MG PO TABS: ORAL_TABLET | ORAL | 0 refills | Status: DC

## 2019-03-29 ENCOUNTER — Other Ambulatory Visit: Payer: Self-pay

## 2019-03-29 ENCOUNTER — Ambulatory Visit (INDEPENDENT_AMBULATORY_CARE_PROVIDER_SITE_OTHER): Payer: Medicare Other | Admitting: Pharmacist

## 2019-03-29 DIAGNOSIS — I059 Rheumatic mitral valve disease, unspecified: Secondary | ICD-10-CM | POA: Diagnosis not present

## 2019-03-29 DIAGNOSIS — Z5181 Encounter for therapeutic drug level monitoring: Secondary | ICD-10-CM | POA: Diagnosis not present

## 2019-03-29 LAB — POCT INR: INR: 4.5 — AB (ref 2.0–3.0)

## 2019-03-29 NOTE — Patient Instructions (Signed)
Description   Hold tomorrow and then start taking 1.5 tablets daily except for 2 tablets on Mondays and Fridays. Recheck INR in 2 weeks. Call our office if you have any questions [phone]

## 2019-04-17 ENCOUNTER — Other Ambulatory Visit: Payer: Self-pay

## 2019-04-17 ENCOUNTER — Ambulatory Visit (INDEPENDENT_AMBULATORY_CARE_PROVIDER_SITE_OTHER): Payer: Medicare Other | Admitting: *Deleted

## 2019-04-17 DIAGNOSIS — Z5181 Encounter for therapeutic drug level monitoring: Secondary | ICD-10-CM | POA: Diagnosis not present

## 2019-04-17 DIAGNOSIS — I059 Rheumatic mitral valve disease, unspecified: Secondary | ICD-10-CM | POA: Diagnosis not present

## 2019-04-17 LAB — PROTIME-INR
INR: 6.8 (ref 0.9–1.2)
Prothrombin Time: 71.6 s — ABNORMAL HIGH (ref 9.1–12.0)

## 2019-04-17 LAB — POCT INR: INR: 7.5 — AB (ref 2.0–3.0)

## 2019-04-17 NOTE — Patient Instructions (Signed)
Description   Pt taken to the lab for STAT INR. Spoke with pt and instructed to hold tomorrow's dose, hold Friday's dose, hold Saturday's dose then start taking 1.5 tablets daily except 2 tablets on Mondays.  Recheck INR on Tuesday. Call our office if you have any questions [phone]

## 2019-04-23 ENCOUNTER — Other Ambulatory Visit: Payer: Self-pay

## 2019-04-23 ENCOUNTER — Ambulatory Visit (INDEPENDENT_AMBULATORY_CARE_PROVIDER_SITE_OTHER): Payer: Medicare Other | Admitting: *Deleted

## 2019-04-23 DIAGNOSIS — I059 Rheumatic mitral valve disease, unspecified: Secondary | ICD-10-CM

## 2019-04-23 DIAGNOSIS — Z5181 Encounter for therapeutic drug level monitoring: Secondary | ICD-10-CM

## 2019-04-23 LAB — POCT INR: INR: 1.7 — AB (ref 2.0–3.0)

## 2019-04-23 NOTE — Patient Instructions (Signed)
Description   Take 2 tablets today and tomorrow, then start taking 1.5 tablets daily except 2 tablets on Mondays.  Recheck INR in 2 weeks. Call our office if you have any questions [phone]

## 2019-05-07 ENCOUNTER — Ambulatory Visit (INDEPENDENT_AMBULATORY_CARE_PROVIDER_SITE_OTHER): Payer: Medicare Other | Admitting: *Deleted

## 2019-05-07 ENCOUNTER — Other Ambulatory Visit: Payer: Self-pay

## 2019-05-07 DIAGNOSIS — Z5181 Encounter for therapeutic drug level monitoring: Secondary | ICD-10-CM

## 2019-05-07 DIAGNOSIS — I059 Rheumatic mitral valve disease, unspecified: Secondary | ICD-10-CM | POA: Diagnosis not present

## 2019-05-07 LAB — POCT INR: INR: 4.2 — AB (ref 2.0–3.0)

## 2019-05-07 NOTE — Patient Instructions (Signed)
Description   Hold today and then continue taking 1.5 tablets daily except 2 tablets on Mondays.  Recheck INR in 2 weeks. Call our office if you have any questions [phone]

## 2019-05-28 ENCOUNTER — Ambulatory Visit (INDEPENDENT_AMBULATORY_CARE_PROVIDER_SITE_OTHER): Payer: Medicare Other | Admitting: *Deleted

## 2019-05-28 ENCOUNTER — Other Ambulatory Visit: Payer: Self-pay

## 2019-05-28 ENCOUNTER — Encounter (INDEPENDENT_AMBULATORY_CARE_PROVIDER_SITE_OTHER): Payer: Self-pay

## 2019-05-28 DIAGNOSIS — I059 Rheumatic mitral valve disease, unspecified: Secondary | ICD-10-CM

## 2019-05-28 DIAGNOSIS — Z5181 Encounter for therapeutic drug level monitoring: Secondary | ICD-10-CM | POA: Diagnosis not present

## 2019-05-28 LAB — PROTIME-INR
INR: 6.8 (ref 0.9–1.2)
INR: 6.8 — AB (ref 0.9–1.1)
Prothrombin Time: 71.5 s — ABNORMAL HIGH (ref 9.1–12.0)

## 2019-05-28 LAB — POCT INR: INR: 7.1 — AB (ref 2.0–3.0)

## 2019-05-28 NOTE — Progress Notes (Addendum)
Pt sent to lab to get stat INR. Pt instructed not to take any warfarin until pt is called with further dosing instructions. Informed pt she is at a higher risk for bleeding and to seek medical attention if she starts to have any bleeding. Called pt on 2/16 instructed her to hold warfarin until her appointment on 2/19 and informed her of her new dosage 1.5 tablets daily except for 1 tablet on Monday and Fridays.   Late entry: 03/28/2020 @ 12:20, Pt was to come in on 05/31/2019 to get her INR rechecked. Clinic closed tomorrow morning due to inclement weather. Called and spoke to pt and gave her updated dosing instructions to hold warfarin on 2/19 and then to restart warfarin on 2/20 with new dosage 1.5 tablets daily except for 1 tablet on Monday and Fridays. Pt verbalized understanding.

## 2019-05-28 NOTE — Patient Instructions (Addendum)
Description   Called and spoke with pt instructed pt to hold warfarin until she comes to appointment on Friday 2/19. Decreased warfarin dose to 1.5 tablets daily except for 1 tablet on Monday and Friday.  Call our office if you have any questions [phone]

## 2019-06-05 ENCOUNTER — Ambulatory Visit (INDEPENDENT_AMBULATORY_CARE_PROVIDER_SITE_OTHER): Payer: Medicare Other

## 2019-06-05 ENCOUNTER — Other Ambulatory Visit: Payer: Self-pay

## 2019-06-05 DIAGNOSIS — Z5181 Encounter for therapeutic drug level monitoring: Secondary | ICD-10-CM | POA: Diagnosis not present

## 2019-06-05 DIAGNOSIS — I059 Rheumatic mitral valve disease, unspecified: Secondary | ICD-10-CM | POA: Diagnosis not present

## 2019-06-05 LAB — POCT INR: INR: 2.6 (ref 2.0–3.0)

## 2019-06-05 NOTE — Patient Instructions (Signed)
Description   Continue on same dosage 1.5 tablets daily except for 1 tablet on Mondays and Fridays.  Recheck in 2 weeks.  Call our office if you have any questions [phone]

## 2019-06-07 ENCOUNTER — Ambulatory Visit: Payer: Medicare Other

## 2019-06-09 ENCOUNTER — Ambulatory Visit: Payer: Medicare Other | Attending: Internal Medicine

## 2019-06-09 DIAGNOSIS — Z23 Encounter for immunization: Secondary | ICD-10-CM | POA: Insufficient documentation

## 2019-06-09 NOTE — Progress Notes (Signed)
   Covid-19 Vaccination Clinic  Name:  Deborah [name]    MRN: [id number] DOB: [birth year]  06/09/2019  [name] was observed post Covid-19 immunization for 15 minutes without incidence. She was provided with Vaccine Information Sheet and instruction to access the V-Safe system.   [name] was instructed to call 911 with any severe reactions post vaccine: Marland Kitchen Difficulty breathing  . Swelling of your face and throat  . A fast heartbeat  . A bad rash all over your body  . Dizziness and weakness    Immunizations Administered    Name Date Dose VIS Date Route   Pfizer COVID-19 Vaccine 06/09/2019  4:35 PM 0.3 mL 03/22/2019 Intramuscular   Manufacturer: [employer], [employer]   Lot: [id number]   NDC: [address]-1

## 2019-06-19 ENCOUNTER — Other Ambulatory Visit: Payer: Self-pay

## 2019-06-19 ENCOUNTER — Other Ambulatory Visit: Payer: Self-pay | Admitting: *Deleted

## 2019-06-19 ENCOUNTER — Ambulatory Visit (INDEPENDENT_AMBULATORY_CARE_PROVIDER_SITE_OTHER): Payer: Medicare Other | Admitting: *Deleted

## 2019-06-19 DIAGNOSIS — I059 Rheumatic mitral valve disease, unspecified: Secondary | ICD-10-CM | POA: Diagnosis not present

## 2019-06-19 DIAGNOSIS — Z5181 Encounter for therapeutic drug level monitoring: Secondary | ICD-10-CM

## 2019-06-19 LAB — POCT INR: INR: 2.6 (ref 2.0–3.0)

## 2019-06-19 MED ORDER — WARFARIN SODIUM 5 MG PO TABS: ORAL_TABLET | ORAL | 0 refills | Status: DC

## 2019-06-19 NOTE — Patient Instructions (Signed)
Description   Continue on same dosage 1.5 tablets daily except for 1 tablet on Mondays and Fridays.  Recheck in 4 weeks.  Call our office if you have any questions [phone]

## 2019-07-09 ENCOUNTER — Ambulatory Visit: Payer: Medicare Other

## 2019-07-10 ENCOUNTER — Ambulatory Visit: Payer: Medicare Other | Attending: Internal Medicine

## 2019-07-10 ENCOUNTER — Ambulatory Visit: Payer: Medicare Other

## 2019-07-10 DIAGNOSIS — Z23 Encounter for immunization: Secondary | ICD-10-CM

## 2019-07-16 ENCOUNTER — Ambulatory Visit: Payer: Medicare Other

## 2019-07-23 ENCOUNTER — Ambulatory Visit (INDEPENDENT_AMBULATORY_CARE_PROVIDER_SITE_OTHER): Payer: Medicare Other | Admitting: *Deleted

## 2019-07-23 ENCOUNTER — Other Ambulatory Visit: Payer: Self-pay

## 2019-07-23 DIAGNOSIS — Z5181 Encounter for therapeutic drug level monitoring: Secondary | ICD-10-CM | POA: Diagnosis not present

## 2019-07-23 DIAGNOSIS — I059 Rheumatic mitral valve disease, unspecified: Secondary | ICD-10-CM

## 2019-07-23 LAB — POCT INR: INR: 2.8 (ref 2.0–3.0)

## 2019-07-23 NOTE — Patient Instructions (Signed)
Description   Continue on same dosage 1.5 tablets daily except for 1 tablet on Mondays and Fridays.  Recheck in 5 weeks.  Call our office if you have any questions [phone]

## 2019-08-09 ENCOUNTER — Telehealth: Payer: Self-pay | Admitting: Internal Medicine

## 2019-08-09 DIAGNOSIS — I5022 Chronic systolic (congestive) heart failure: Secondary | ICD-10-CM | POA: Diagnosis not present

## 2019-08-09 DIAGNOSIS — I429 Cardiomyopathy, unspecified: Secondary | ICD-10-CM | POA: Diagnosis not present

## 2019-08-09 DIAGNOSIS — E1165 Type 2 diabetes mellitus with hyperglycemia: Secondary | ICD-10-CM | POA: Diagnosis not present

## 2019-08-09 DIAGNOSIS — I130 Hypertensive heart and chronic kidney disease with heart failure and stage 1 through stage 4 chronic kidney disease, or unspecified chronic kidney disease: Secondary | ICD-10-CM | POA: Diagnosis not present

## 2019-08-09 NOTE — Telephone Encounter (Signed)
Pt is on Coumadin. I will forward call to the coumadin clinic to reach out to the pt in regards to her question if she can have beets.

## 2019-08-09 NOTE — Telephone Encounter (Signed)
Returned a call to the pt and made her aware that she can have some beets. Pt states she hasn't had in a while and was going to get some. She wanted to know if she could use the can or jar, advised that is is her preference. She had not other questions and advised to call back if needed.

## 2019-08-09 NOTE — Telephone Encounter (Signed)
Patient states she is requesting to speak with a nurse regarding whether or not she is able to eat beets. Please advise.

## 2019-09-03 ENCOUNTER — Other Ambulatory Visit: Payer: Self-pay

## 2019-09-03 ENCOUNTER — Ambulatory Visit (INDEPENDENT_AMBULATORY_CARE_PROVIDER_SITE_OTHER): Payer: Medicare Other | Admitting: *Deleted

## 2019-09-03 DIAGNOSIS — Z5181 Encounter for therapeutic drug level monitoring: Secondary | ICD-10-CM | POA: Diagnosis not present

## 2019-09-03 DIAGNOSIS — I059 Rheumatic mitral valve disease, unspecified: Secondary | ICD-10-CM | POA: Diagnosis not present

## 2019-09-03 LAB — POCT INR: INR: 2.3 (ref 2.0–3.0)

## 2019-09-03 NOTE — Patient Instructions (Signed)
Description   Take 2 tablets today and then continue on same dosage 1.5 tablets daily except for 1 tablet on Mondays and Fridays.  Recheck in 4 weeks.  Call our office if you have any questions [phone]

## 2019-09-10 ENCOUNTER — Other Ambulatory Visit: Payer: Self-pay | Admitting: Pharmacist

## 2019-09-10 MED ORDER — WARFARIN SODIUM 5 MG PO TABS: ORAL_TABLET | ORAL | 0 refills | Status: DC

## 2019-10-01 ENCOUNTER — Ambulatory Visit (INDEPENDENT_AMBULATORY_CARE_PROVIDER_SITE_OTHER): Payer: Medicare Other

## 2019-10-01 ENCOUNTER — Other Ambulatory Visit: Payer: Self-pay

## 2019-10-01 DIAGNOSIS — Z5181 Encounter for therapeutic drug level monitoring: Secondary | ICD-10-CM

## 2019-10-01 DIAGNOSIS — I059 Rheumatic mitral valve disease, unspecified: Secondary | ICD-10-CM | POA: Diagnosis not present

## 2019-10-01 LAB — POCT INR: INR: 2.1 (ref 2.0–3.0)

## 2019-10-01 NOTE — Patient Instructions (Addendum)
Take 2 tablets today and start taking 1.5 tablets daily.  Recheck in 3 weeks.  Call our office if you have any questions [phone]

## 2019-10-11 ENCOUNTER — Other Ambulatory Visit (HOSPITAL_COMMUNITY): Payer: Self-pay | Admitting: Internal Medicine

## 2019-10-18 ENCOUNTER — Other Ambulatory Visit (HOSPITAL_COMMUNITY): Payer: Self-pay | Admitting: Internal Medicine

## 2019-10-22 ENCOUNTER — Ambulatory Visit (INDEPENDENT_AMBULATORY_CARE_PROVIDER_SITE_OTHER): Payer: Medicare Other | Admitting: *Deleted

## 2019-10-22 DIAGNOSIS — I5022 Chronic systolic (congestive) heart failure: Secondary | ICD-10-CM

## 2019-10-22 LAB — CUP PACEART REMOTE DEVICE CHECK
Battery Remaining Longevity: 66 mo
Battery Remaining Percentage: 72 %
Battery Voltage: 2.98 V
Brady Statistic RV Percent Paced: 1 %
Date Time Interrogation Session: 20210713020005
HighPow Impedance: 42 Ohm
HighPow Impedance: 43 Ohm
Implantable Lead Implant Date: 20090410
Implantable Lead Location: 753860
Implantable Lead Model: 7120
Implantable Pulse Generator Implant Date: 20180423
Lead Channel Impedance Value: 440 Ohm
Lead Channel Pacing Threshold Amplitude: 0.75 V
Lead Channel Pacing Threshold Pulse Width: 0.5 ms
Lead Channel Sensing Intrinsic Amplitude: 11.7 mV
Lead Channel Setting Pacing Amplitude: 2.5 V
Lead Channel Setting Pacing Pulse Width: 0.5 ms
Lead Channel Setting Sensing Sensitivity: 0.5 mV
Pulse Gen Serial Number: 7423042

## 2019-10-23 NOTE — Progress Notes (Signed)
Remote ICD transmission.   

## 2019-10-25 ENCOUNTER — Other Ambulatory Visit: Payer: Self-pay

## 2019-10-25 ENCOUNTER — Ambulatory Visit (INDEPENDENT_AMBULATORY_CARE_PROVIDER_SITE_OTHER): Payer: Medicare HMO

## 2019-10-25 DIAGNOSIS — I059 Rheumatic mitral valve disease, unspecified: Secondary | ICD-10-CM | POA: Diagnosis not present

## 2019-10-25 DIAGNOSIS — Z5181 Encounter for therapeutic drug level monitoring: Secondary | ICD-10-CM | POA: Diagnosis not present

## 2019-10-25 LAB — POCT INR: INR: 3.2 — AB (ref 2.0–3.0)

## 2019-10-25 NOTE — Patient Instructions (Signed)
Continue taking 1.5 tablets daily.  Recheck in 4 weeks.  Call our office if you have any questions [phone]

## 2019-11-22 ENCOUNTER — Other Ambulatory Visit: Payer: Self-pay

## 2019-11-22 ENCOUNTER — Ambulatory Visit (INDEPENDENT_AMBULATORY_CARE_PROVIDER_SITE_OTHER): Payer: Medicare HMO | Admitting: Pharmacist

## 2019-11-22 DIAGNOSIS — I059 Rheumatic mitral valve disease, unspecified: Secondary | ICD-10-CM

## 2019-11-22 DIAGNOSIS — Z5181 Encounter for therapeutic drug level monitoring: Secondary | ICD-10-CM

## 2019-11-22 LAB — POCT INR: INR: 3.8 — AB (ref 2.0–3.0)

## 2019-11-22 NOTE — Patient Instructions (Signed)
Do not take any coumadin today. Then continue taking 1.5 tablets daily.  Recheck in 3 weeks.  Call our office if you have any questions [phone]

## 2019-11-26 DIAGNOSIS — I252 Old myocardial infarction: Secondary | ICD-10-CM | POA: Diagnosis not present

## 2019-11-26 DIAGNOSIS — Z7982 Long term (current) use of aspirin: Secondary | ICD-10-CM | POA: Diagnosis not present

## 2019-11-26 DIAGNOSIS — Z87891 Personal history of nicotine dependence: Secondary | ICD-10-CM | POA: Diagnosis not present

## 2019-11-26 DIAGNOSIS — E119 Type 2 diabetes mellitus without complications: Secondary | ICD-10-CM | POA: Diagnosis not present

## 2019-11-26 DIAGNOSIS — E785 Hyperlipidemia, unspecified: Secondary | ICD-10-CM | POA: Diagnosis not present

## 2019-11-26 DIAGNOSIS — R110 Nausea: Secondary | ICD-10-CM | POA: Diagnosis not present

## 2019-11-26 DIAGNOSIS — E261 Secondary hyperaldosteronism: Secondary | ICD-10-CM | POA: Diagnosis not present

## 2019-11-26 DIAGNOSIS — R030 Elevated blood-pressure reading, without diagnosis of hypertension: Secondary | ICD-10-CM | POA: Diagnosis not present

## 2019-11-26 DIAGNOSIS — I509 Heart failure, unspecified: Secondary | ICD-10-CM | POA: Diagnosis not present

## 2019-11-26 DIAGNOSIS — Z7901 Long term (current) use of anticoagulants: Secondary | ICD-10-CM | POA: Diagnosis not present

## 2019-12-03 DIAGNOSIS — I5022 Chronic systolic (congestive) heart failure: Secondary | ICD-10-CM | POA: Diagnosis not present

## 2019-12-03 DIAGNOSIS — I130 Hypertensive heart and chronic kidney disease with heart failure and stage 1 through stage 4 chronic kidney disease, or unspecified chronic kidney disease: Secondary | ICD-10-CM | POA: Diagnosis not present

## 2019-12-03 DIAGNOSIS — E782 Mixed hyperlipidemia: Secondary | ICD-10-CM | POA: Diagnosis not present

## 2019-12-03 DIAGNOSIS — E1165 Type 2 diabetes mellitus with hyperglycemia: Secondary | ICD-10-CM | POA: Diagnosis not present

## 2019-12-03 DIAGNOSIS — I10 Essential (primary) hypertension: Secondary | ICD-10-CM | POA: Diagnosis not present

## 2019-12-13 ENCOUNTER — Other Ambulatory Visit: Payer: Self-pay

## 2019-12-13 ENCOUNTER — Ambulatory Visit (INDEPENDENT_AMBULATORY_CARE_PROVIDER_SITE_OTHER): Payer: Medicare HMO | Admitting: *Deleted

## 2019-12-13 DIAGNOSIS — I059 Rheumatic mitral valve disease, unspecified: Secondary | ICD-10-CM

## 2019-12-13 DIAGNOSIS — Z5181 Encounter for therapeutic drug level monitoring: Secondary | ICD-10-CM | POA: Diagnosis not present

## 2019-12-13 LAB — POCT INR: INR: 1.5 — AB (ref 2.0–3.0)

## 2019-12-13 NOTE — Patient Instructions (Signed)
Description   Today and tomorrow take 2 tablets then continue taking 1.5 tablets daily.  Recheck in 1 week. Call our office if you have any questions [phone]

## 2019-12-20 ENCOUNTER — Other Ambulatory Visit: Payer: Self-pay

## 2019-12-20 ENCOUNTER — Ambulatory Visit (INDEPENDENT_AMBULATORY_CARE_PROVIDER_SITE_OTHER): Payer: Medicare HMO | Admitting: *Deleted

## 2019-12-20 DIAGNOSIS — Z5181 Encounter for therapeutic drug level monitoring: Secondary | ICD-10-CM | POA: Diagnosis not present

## 2019-12-20 DIAGNOSIS — I059 Rheumatic mitral valve disease, unspecified: Secondary | ICD-10-CM | POA: Diagnosis not present

## 2019-12-20 LAB — POCT INR: INR: 2.1 (ref 2.0–3.0)

## 2019-12-20 NOTE — Patient Instructions (Signed)
Description   Take 2 tablets today, and then start taking 1.5 tablets daily except for 2 tablets on Sundays. Recheck INR in 2 weeks. Ben consistent with your greens, 2 serving per week. Call our office if you have any questions [phone]

## 2020-01-03 ENCOUNTER — Other Ambulatory Visit: Payer: Self-pay

## 2020-01-03 ENCOUNTER — Ambulatory Visit (INDEPENDENT_AMBULATORY_CARE_PROVIDER_SITE_OTHER): Payer: Medicare HMO

## 2020-01-03 DIAGNOSIS — I059 Rheumatic mitral valve disease, unspecified: Secondary | ICD-10-CM | POA: Diagnosis not present

## 2020-01-03 DIAGNOSIS — Z5181 Encounter for therapeutic drug level monitoring: Secondary | ICD-10-CM

## 2020-01-03 LAB — POCT INR: INR: 2.2 (ref 2.0–3.0)

## 2020-01-03 NOTE — Patient Instructions (Signed)
Description   Take an extra 1/2 tablet today, then start taking 1.5 tablets daily except for 2 tablets on Sundays and Thursdays. Recheck INR in 2 weeks. Ben consistent with your greens, 2 serving per week. Call our office if you have any questions [phone]

## 2020-01-17 ENCOUNTER — Ambulatory Visit (INDEPENDENT_AMBULATORY_CARE_PROVIDER_SITE_OTHER): Payer: Medicare HMO | Admitting: Pharmacist

## 2020-01-17 ENCOUNTER — Other Ambulatory Visit: Payer: Self-pay

## 2020-01-17 DIAGNOSIS — Z952 Presence of prosthetic heart valve: Secondary | ICD-10-CM

## 2020-01-17 DIAGNOSIS — Z5181 Encounter for therapeutic drug level monitoring: Secondary | ICD-10-CM | POA: Diagnosis not present

## 2020-01-17 DIAGNOSIS — I059 Rheumatic mitral valve disease, unspecified: Secondary | ICD-10-CM | POA: Diagnosis not present

## 2020-01-17 LAB — PROTIME-INR
INR: 6.2 (ref 0.9–1.2)
Prothrombin Time: 61.8 s — ABNORMAL HIGH (ref 9.1–12.0)

## 2020-01-17 LAB — POCT INR: INR: 6.9 — AB (ref 2.0–3.0)

## 2020-01-17 NOTE — Patient Instructions (Addendum)
Description   INR from lab: 6.2  Patient already took warfarin this morning.  Called patient, instructed her to hold dose tomorrow, Sunday, and Monday and reduce schedule to 10mg  on Sunday, 7.5mg  all other days of the week.  Advised to eat greens. [phone]

## 2020-01-30 ENCOUNTER — Other Ambulatory Visit: Payer: Self-pay

## 2020-01-30 ENCOUNTER — Ambulatory Visit (INDEPENDENT_AMBULATORY_CARE_PROVIDER_SITE_OTHER): Payer: Medicare HMO

## 2020-01-30 DIAGNOSIS — I059 Rheumatic mitral valve disease, unspecified: Secondary | ICD-10-CM

## 2020-01-30 DIAGNOSIS — Z5181 Encounter for therapeutic drug level monitoring: Secondary | ICD-10-CM | POA: Diagnosis not present

## 2020-01-30 LAB — POCT INR: INR: 4.4 — AB (ref 2.0–3.0)

## 2020-01-30 NOTE — Patient Instructions (Signed)
Description   Skip today's dosage of Warfarin, then start taking 1.5 tablets daily.  Recheck in 2 weeks.  [facility] [phone]

## 2020-02-13 ENCOUNTER — Other Ambulatory Visit: Payer: Self-pay

## 2020-02-13 ENCOUNTER — Ambulatory Visit (INDEPENDENT_AMBULATORY_CARE_PROVIDER_SITE_OTHER): Payer: Medicare HMO | Admitting: *Deleted

## 2020-02-13 DIAGNOSIS — I059 Rheumatic mitral valve disease, unspecified: Secondary | ICD-10-CM

## 2020-02-13 DIAGNOSIS — Z5181 Encounter for therapeutic drug level monitoring: Secondary | ICD-10-CM

## 2020-02-13 LAB — POCT INR: INR: 2.2 (ref 2.0–3.0)

## 2020-02-13 NOTE — Patient Instructions (Signed)
Description   Take 2 tablets of warfarin today and then continue to taking 1.5 tablets daily.  Recheck in 2 weeks.  [facility] [phone]

## 2020-03-23 ENCOUNTER — Other Ambulatory Visit: Payer: Self-pay

## 2020-03-23 ENCOUNTER — Ambulatory Visit (INDEPENDENT_AMBULATORY_CARE_PROVIDER_SITE_OTHER): Payer: Medicare HMO | Admitting: *Deleted

## 2020-03-23 DIAGNOSIS — Z5181 Encounter for therapeutic drug level monitoring: Secondary | ICD-10-CM | POA: Diagnosis not present

## 2020-03-23 DIAGNOSIS — I059 Rheumatic mitral valve disease, unspecified: Secondary | ICD-10-CM

## 2020-03-23 LAB — POCT INR: INR: 3.6 — AB (ref 2.0–3.0)

## 2020-03-23 NOTE — Patient Instructions (Addendum)
  Description    Take 1/2 a tablet tomorrow then continue to taking 1.5 tablets daily.  Recheck in 3 weeks.  [facility] [phone]

## 2020-04-14 DIAGNOSIS — I429 Cardiomyopathy, unspecified: Secondary | ICD-10-CM | POA: Diagnosis not present

## 2020-04-14 DIAGNOSIS — I2510 Atherosclerotic heart disease of native coronary artery without angina pectoris: Secondary | ICD-10-CM | POA: Diagnosis not present

## 2020-04-14 DIAGNOSIS — I130 Hypertensive heart and chronic kidney disease with heart failure and stage 1 through stage 4 chronic kidney disease, or unspecified chronic kidney disease: Secondary | ICD-10-CM | POA: Diagnosis not present

## 2020-04-14 DIAGNOSIS — E782 Mixed hyperlipidemia: Secondary | ICD-10-CM | POA: Diagnosis not present

## 2020-04-14 DIAGNOSIS — Z5181 Encounter for therapeutic drug level monitoring: Secondary | ICD-10-CM | POA: Diagnosis not present

## 2020-04-14 DIAGNOSIS — Z0000 Encounter for general adult medical examination without abnormal findings: Secondary | ICD-10-CM | POA: Diagnosis not present

## 2020-04-14 DIAGNOSIS — I5022 Chronic systolic (congestive) heart failure: Secondary | ICD-10-CM | POA: Diagnosis not present

## 2020-04-14 DIAGNOSIS — I10 Essential (primary) hypertension: Secondary | ICD-10-CM | POA: Diagnosis not present

## 2020-04-14 DIAGNOSIS — E1165 Type 2 diabetes mellitus with hyperglycemia: Secondary | ICD-10-CM | POA: Diagnosis not present

## 2020-04-14 DIAGNOSIS — N1831 Chronic kidney disease, stage 3a: Secondary | ICD-10-CM | POA: Diagnosis not present

## 2020-04-16 ENCOUNTER — Ambulatory Visit (INDEPENDENT_AMBULATORY_CARE_PROVIDER_SITE_OTHER): Payer: Medicare HMO

## 2020-04-16 ENCOUNTER — Other Ambulatory Visit: Payer: Self-pay

## 2020-04-16 DIAGNOSIS — I059 Rheumatic mitral valve disease, unspecified: Secondary | ICD-10-CM

## 2020-04-16 DIAGNOSIS — Z5181 Encounter for therapeutic drug level monitoring: Secondary | ICD-10-CM

## 2020-04-16 LAB — POCT INR: INR: 2.5 (ref 2.0–3.0)

## 2020-04-16 NOTE — Patient Instructions (Signed)
Description   Continue on same dosage 1.5 tablets daily.  Recheck in 4 weeks.  [facility] [phone]

## 2020-05-14 ENCOUNTER — Ambulatory Visit (INDEPENDENT_AMBULATORY_CARE_PROVIDER_SITE_OTHER): Payer: Medicare HMO | Admitting: *Deleted

## 2020-05-14 ENCOUNTER — Other Ambulatory Visit: Payer: Self-pay

## 2020-05-14 DIAGNOSIS — I059 Rheumatic mitral valve disease, unspecified: Secondary | ICD-10-CM | POA: Diagnosis not present

## 2020-05-14 DIAGNOSIS — Z5181 Encounter for therapeutic drug level monitoring: Secondary | ICD-10-CM

## 2020-05-14 LAB — POCT INR: INR: 2.3 (ref 2.0–3.0)

## 2020-05-14 NOTE — Patient Instructions (Signed)
Description   Take another 1/2 tablet of warfarin today to make 2 tablets for  today. Then continue on same dosage 1.5 tablets daily.  Recheck in 3 weeks.  [facility] [phone]

## 2020-05-26 ENCOUNTER — Other Ambulatory Visit: Payer: Self-pay

## 2020-05-26 MED ORDER — WARFARIN SODIUM 5 MG PO TABS: ORAL_TABLET | ORAL | 1 refills | Status: DC

## 2020-06-08 ENCOUNTER — Other Ambulatory Visit: Payer: Self-pay

## 2020-06-08 ENCOUNTER — Ambulatory Visit (INDEPENDENT_AMBULATORY_CARE_PROVIDER_SITE_OTHER): Payer: Medicare HMO

## 2020-06-08 DIAGNOSIS — I059 Rheumatic mitral valve disease, unspecified: Secondary | ICD-10-CM | POA: Diagnosis not present

## 2020-06-08 DIAGNOSIS — Z5181 Encounter for therapeutic drug level monitoring: Secondary | ICD-10-CM

## 2020-06-08 LAB — POCT INR: INR: 2 (ref 2.0–3.0)

## 2020-06-08 NOTE — Patient Instructions (Signed)
-  START NEW DOSAGE of warfarin 1.5 tablets except 2 TABLETS ON MONDAYS.   - Recheck in 3 weeks.   [facility] [phone]

## 2020-06-09 DIAGNOSIS — Z7901 Long term (current) use of anticoagulants: Secondary | ICD-10-CM | POA: Diagnosis not present

## 2020-06-09 DIAGNOSIS — I252 Old myocardial infarction: Secondary | ICD-10-CM | POA: Diagnosis not present

## 2020-06-09 DIAGNOSIS — G4700 Insomnia, unspecified: Secondary | ICD-10-CM | POA: Diagnosis not present

## 2020-06-09 DIAGNOSIS — I110 Hypertensive heart disease with heart failure: Secondary | ICD-10-CM | POA: Diagnosis not present

## 2020-06-09 DIAGNOSIS — M62838 Other muscle spasm: Secondary | ICD-10-CM | POA: Diagnosis not present

## 2020-06-09 DIAGNOSIS — E261 Secondary hyperaldosteronism: Secondary | ICD-10-CM | POA: Diagnosis not present

## 2020-06-09 DIAGNOSIS — I509 Heart failure, unspecified: Secondary | ICD-10-CM | POA: Diagnosis not present

## 2020-06-09 DIAGNOSIS — E785 Hyperlipidemia, unspecified: Secondary | ICD-10-CM | POA: Diagnosis not present

## 2020-06-09 DIAGNOSIS — I2510 Atherosclerotic heart disease of native coronary artery without angina pectoris: Secondary | ICD-10-CM | POA: Diagnosis not present

## 2020-06-09 DIAGNOSIS — Z008 Encounter for other general examination: Secondary | ICD-10-CM | POA: Diagnosis not present

## 2020-06-09 DIAGNOSIS — G8929 Other chronic pain: Secondary | ICD-10-CM | POA: Diagnosis not present

## 2020-06-29 ENCOUNTER — Telehealth: Payer: Self-pay | Admitting: *Deleted

## 2020-06-29 NOTE — Telephone Encounter (Signed)
Left a message for the pt to call back to reschedule missed Anticoagulation Appt.  

## 2020-07-08 ENCOUNTER — Ambulatory Visit (INDEPENDENT_AMBULATORY_CARE_PROVIDER_SITE_OTHER): Payer: Medicare HMO | Admitting: *Deleted

## 2020-07-08 ENCOUNTER — Other Ambulatory Visit: Payer: Self-pay

## 2020-07-08 DIAGNOSIS — I059 Rheumatic mitral valve disease, unspecified: Secondary | ICD-10-CM | POA: Diagnosis not present

## 2020-07-08 DIAGNOSIS — Z5181 Encounter for therapeutic drug level monitoring: Secondary | ICD-10-CM | POA: Diagnosis not present

## 2020-07-08 LAB — POCT INR: INR: 5.5 — AB (ref 2.0–3.0)

## 2020-07-08 NOTE — Patient Instructions (Signed)
Description   Hold warfarin today and tomorrow, then START taking warfarin 1.5 tablets daily. Recheck INR in 2 weeks. [facility] [phone].

## 2020-07-13 DIAGNOSIS — R69 Illness, unspecified: Secondary | ICD-10-CM | POA: Diagnosis not present

## 2020-07-13 DIAGNOSIS — M79645 Pain in left finger(s): Secondary | ICD-10-CM | POA: Diagnosis not present

## 2020-07-13 DIAGNOSIS — E1165 Type 2 diabetes mellitus with hyperglycemia: Secondary | ICD-10-CM | POA: Diagnosis not present

## 2020-07-13 DIAGNOSIS — I5022 Chronic systolic (congestive) heart failure: Secondary | ICD-10-CM | POA: Diagnosis not present

## 2020-07-13 DIAGNOSIS — M150 Primary generalized (osteo)arthritis: Secondary | ICD-10-CM | POA: Diagnosis not present

## 2020-07-20 ENCOUNTER — Inpatient Hospital Stay (HOSPITAL_COMMUNITY)
Admission: EM | Admit: 2020-07-20 | Discharge: 2020-07-31 | DRG: 811 | Disposition: A | Discharge status: Home Health | Payer: Medicare HMO | Attending: Internal Medicine | Admitting: Internal Medicine

## 2020-07-20 ENCOUNTER — Other Ambulatory Visit: Payer: Self-pay

## 2020-07-20 ENCOUNTER — Encounter (HOSPITAL_COMMUNITY): Payer: Self-pay

## 2020-07-20 ENCOUNTER — Emergency Department (HOSPITAL_COMMUNITY): Payer: Medicare HMO

## 2020-07-20 DIAGNOSIS — I5022 Chronic systolic (congestive) heart failure: Secondary | ICD-10-CM | POA: Diagnosis not present

## 2020-07-20 DIAGNOSIS — E785 Hyperlipidemia, unspecified: Secondary | ICD-10-CM | POA: Diagnosis present

## 2020-07-20 DIAGNOSIS — I059 Rheumatic mitral valve disease, unspecified: Secondary | ICD-10-CM | POA: Diagnosis not present

## 2020-07-20 DIAGNOSIS — I350 Nonrheumatic aortic (valve) stenosis: Secondary | ICD-10-CM | POA: Diagnosis present

## 2020-07-20 DIAGNOSIS — R197 Diarrhea, unspecified: Secondary | ICD-10-CM

## 2020-07-20 DIAGNOSIS — D638 Anemia in other chronic diseases classified elsewhere: Secondary | ICD-10-CM | POA: Diagnosis present

## 2020-07-20 DIAGNOSIS — K2980 Duodenitis without bleeding: Secondary | ICD-10-CM | POA: Diagnosis present

## 2020-07-20 DIAGNOSIS — B9681 Helicobacter pylori [H. pylori] as the cause of diseases classified elsewhere: Secondary | ICD-10-CM | POA: Diagnosis present

## 2020-07-20 DIAGNOSIS — D6832 Hemorrhagic disorder due to extrinsic circulating anticoagulants: Secondary | ICD-10-CM | POA: Diagnosis present

## 2020-07-20 DIAGNOSIS — D696 Thrombocytopenia, unspecified: Secondary | ICD-10-CM | POA: Diagnosis not present

## 2020-07-20 DIAGNOSIS — K264 Chronic or unspecified duodenal ulcer with hemorrhage: Secondary | ICD-10-CM | POA: Diagnosis present

## 2020-07-20 DIAGNOSIS — D62 Acute posthemorrhagic anemia: Principal | ICD-10-CM | POA: Diagnosis present

## 2020-07-20 DIAGNOSIS — I5042 Chronic combined systolic (congestive) and diastolic (congestive) heart failure: Secondary | ICD-10-CM | POA: Diagnosis present

## 2020-07-20 DIAGNOSIS — Z8249 Family history of ischemic heart disease and other diseases of the circulatory system: Secondary | ICD-10-CM

## 2020-07-20 DIAGNOSIS — K269 Duodenal ulcer, unspecified as acute or chronic, without hemorrhage or perforation: Secondary | ICD-10-CM

## 2020-07-20 DIAGNOSIS — I5021 Acute systolic (congestive) heart failure: Secondary | ICD-10-CM | POA: Diagnosis not present

## 2020-07-20 DIAGNOSIS — K922 Gastrointestinal hemorrhage, unspecified: Secondary | ICD-10-CM | POA: Diagnosis not present

## 2020-07-20 DIAGNOSIS — K253 Acute gastric ulcer without hemorrhage or perforation: Secondary | ICD-10-CM

## 2020-07-20 DIAGNOSIS — I110 Hypertensive heart disease with heart failure: Secondary | ICD-10-CM | POA: Diagnosis present

## 2020-07-20 DIAGNOSIS — I517 Cardiomegaly: Secondary | ICD-10-CM | POA: Diagnosis not present

## 2020-07-20 DIAGNOSIS — Z952 Presence of prosthetic heart valve: Secondary | ICD-10-CM | POA: Diagnosis not present

## 2020-07-20 DIAGNOSIS — K259 Gastric ulcer, unspecified as acute or chronic, without hemorrhage or perforation: Secondary | ICD-10-CM | POA: Diagnosis not present

## 2020-07-20 DIAGNOSIS — Z6830 Body mass index (BMI) 30.0-30.9, adult: Secondary | ICD-10-CM

## 2020-07-20 DIAGNOSIS — Z95810 Presence of automatic (implantable) cardiac defibrillator: Secondary | ICD-10-CM

## 2020-07-20 DIAGNOSIS — K921 Melena: Secondary | ICD-10-CM | POA: Diagnosis not present

## 2020-07-20 DIAGNOSIS — E872 Acidosis, unspecified: Secondary | ICD-10-CM | POA: Diagnosis present

## 2020-07-20 DIAGNOSIS — I428 Other cardiomyopathies: Secondary | ICD-10-CM | POA: Diagnosis present

## 2020-07-20 DIAGNOSIS — Z7982 Long term (current) use of aspirin: Secondary | ICD-10-CM | POA: Diagnosis not present

## 2020-07-20 DIAGNOSIS — Z7901 Long term (current) use of anticoagulants: Secondary | ICD-10-CM

## 2020-07-20 DIAGNOSIS — K254 Chronic or unspecified gastric ulcer with hemorrhage: Secondary | ICD-10-CM | POA: Diagnosis present

## 2020-07-20 DIAGNOSIS — I9589 Other hypotension: Secondary | ICD-10-CM | POA: Diagnosis not present

## 2020-07-20 DIAGNOSIS — Z20822 Contact with and (suspected) exposure to covid-19: Secondary | ICD-10-CM | POA: Diagnosis present

## 2020-07-20 DIAGNOSIS — Z87891 Personal history of nicotine dependence: Secondary | ICD-10-CM | POA: Diagnosis not present

## 2020-07-20 DIAGNOSIS — E669 Obesity, unspecified: Secondary | ICD-10-CM | POA: Diagnosis present

## 2020-07-20 DIAGNOSIS — R112 Nausea with vomiting, unspecified: Secondary | ICD-10-CM | POA: Diagnosis not present

## 2020-07-20 DIAGNOSIS — K2950 Unspecified chronic gastritis without bleeding: Secondary | ICD-10-CM | POA: Diagnosis not present

## 2020-07-20 DIAGNOSIS — E119 Type 2 diabetes mellitus without complications: Secondary | ICD-10-CM | POA: Diagnosis present

## 2020-07-20 DIAGNOSIS — I447 Left bundle-branch block, unspecified: Secondary | ICD-10-CM | POA: Diagnosis present

## 2020-07-20 DIAGNOSIS — K2970 Gastritis, unspecified, without bleeding: Secondary | ICD-10-CM | POA: Diagnosis present

## 2020-07-20 DIAGNOSIS — D649 Anemia, unspecified: Secondary | ICD-10-CM | POA: Diagnosis not present

## 2020-07-20 DIAGNOSIS — Z79899 Other long term (current) drug therapy: Secondary | ICD-10-CM | POA: Diagnosis not present

## 2020-07-20 DIAGNOSIS — T45515A Adverse effect of anticoagulants, initial encounter: Secondary | ICD-10-CM | POA: Diagnosis present

## 2020-07-20 DIAGNOSIS — R791 Abnormal coagulation profile: Secondary | ICD-10-CM | POA: Diagnosis not present

## 2020-07-20 DIAGNOSIS — N179 Acute kidney failure, unspecified: Secondary | ICD-10-CM | POA: Diagnosis present

## 2020-07-20 HISTORY — DX: Presence of automatic (implantable) cardiac defibrillator: Z95.810

## 2020-07-20 LAB — COMPREHENSIVE METABOLIC PANEL
ALT: 15 U/L (ref 0–44)
AST: 14 U/L — ABNORMAL LOW (ref 15–41)
Albumin: 3.1 g/dL — ABNORMAL LOW (ref 3.5–5.0)
Alkaline Phosphatase: 38 U/L (ref 38–126)
Anion gap: 7 (ref 5–15)
BUN: 77 mg/dL — ABNORMAL HIGH (ref 8–23)
CO2: 24 mmol/L (ref 22–32)
Calcium: 8.4 mg/dL — ABNORMAL LOW (ref 8.9–10.3)
Chloride: 108 mmol/L (ref 98–111)
Creatinine, Ser: 1.88 mg/dL — ABNORMAL HIGH (ref 0.44–1.00)
GFR, Estimated: 27 mL/min — ABNORMAL LOW (ref >60)
Glucose, Bld: 271 mg/dL — ABNORMAL HIGH (ref 70–99)
Potassium: 4.2 mmol/L (ref 3.5–5.1)
Sodium: 139 mmol/L (ref 135–145)
Total Bilirubin: 0.5 mg/dL (ref 0.3–1.2)
Total Protein: 5.4 g/dL — ABNORMAL LOW (ref 6.5–8.1)

## 2020-07-20 LAB — RETICULOCYTES
Immature Retic Fract: 25.6 % — ABNORMAL HIGH (ref 2.3–15.9)
RBC.: 2.03 MIL/uL — ABNORMAL LOW (ref 3.87–5.11)
Retic Count, Absolute: 69.4 10*3/uL (ref 19.0–186.0)
Retic Ct Pct: 3.4 % — ABNORMAL HIGH (ref 0.4–3.1)

## 2020-07-20 LAB — CBC WITH DIFFERENTIAL/PLATELET
Abs Immature Granulocytes: 0.04 10*3/uL (ref 0.00–0.07)
Basophils Absolute: 0 10*3/uL (ref 0.0–0.1)
Basophils Relative: 0 %
Eosinophils Absolute: 0 10*3/uL (ref 0.0–0.5)
Eosinophils Relative: 0 %
HCT: 22 % — ABNORMAL LOW (ref 36.0–46.0)
Hemoglobin: 6.9 g/dL — CL (ref 12.0–15.0)
Immature Granulocytes: 0 %
Lymphocytes Relative: 9 %
Lymphs Abs: 1 10*3/uL (ref 0.7–4.0)
MCH: 31.7 pg (ref 26.0–34.0)
MCHC: 31.4 g/dL (ref 30.0–36.0)
MCV: 100.9 fL — ABNORMAL HIGH (ref 80.0–100.0)
Monocytes Absolute: 0.6 10*3/uL (ref 0.1–1.0)
Monocytes Relative: 5 %
Neutro Abs: 9.2 10*3/uL — ABNORMAL HIGH (ref 1.7–7.7)
Neutrophils Relative %: 86 %
Platelets: 177 10*3/uL (ref 150–400)
RBC: 2.18 MIL/uL — ABNORMAL LOW (ref 3.87–5.11)
RDW: 12.8 % (ref 11.5–15.5)
WBC: 10.8 10*3/uL — ABNORMAL HIGH (ref 4.0–10.5)
nRBC: 0 % (ref 0.0–0.2)

## 2020-07-20 LAB — VITAMIN B12: Vitamin B-12: 310 pg/mL (ref 180–914)

## 2020-07-20 LAB — LIPASE, BLOOD: Lipase: 34 U/L (ref 11–51)

## 2020-07-20 LAB — PROTIME-INR
INR: 5.6 (ref 0.8–1.2)
Prothrombin Time: 48.9 seconds — ABNORMAL HIGH (ref 11.4–15.2)

## 2020-07-20 LAB — RESP PANEL BY RT-PCR (FLU A&B, COVID) ARPGX2
Influenza A by PCR: NEGATIVE
Influenza B by PCR: NEGATIVE
SARS Coronavirus 2 by RT PCR: NEGATIVE

## 2020-07-20 LAB — FERRITIN: Ferritin: 20 ng/mL (ref 11–307)

## 2020-07-20 LAB — IRON AND TIBC
Iron: 127 ug/dL (ref 28–170)
Saturation Ratios: 39 % — ABNORMAL HIGH (ref 10.4–31.8)
TIBC: 322 ug/dL (ref 250–450)
UIBC: 195 ug/dL

## 2020-07-20 LAB — LACTIC ACID, PLASMA: Lactic Acid, Venous: 2.4 mmol/L (ref 0.5–1.9)

## 2020-07-20 LAB — FOLATE: Folate: 14.5 ng/mL (ref >5.9)

## 2020-07-20 LAB — POC OCCULT BLOOD, ED: Fecal Occult Bld: POSITIVE — AB

## 2020-07-20 MED ORDER — METRONIDAZOLE IN NACL 5-0.79 MG/ML-% IV SOLN
500.0000 mg | Freq: Once | INTRAVENOUS | Status: AC
  Administered 2020-07-20: 500 mg via INTRAVENOUS
  Filled 2020-07-20: qty 100

## 2020-07-20 MED ORDER — SODIUM CHLORIDE 0.9 % IV SOLN: 2.0000 g | INTRAVENOUS | Status: DC

## 2020-07-20 MED ORDER — SODIUM CHLORIDE 0.9 % IV SOLN
2.0000 g | Freq: Once | INTRAVENOUS | Status: AC
  Administered 2020-07-20: 2 g via INTRAVENOUS
  Filled 2020-07-20: qty 2

## 2020-07-20 MED ORDER — SODIUM CHLORIDE 0.9 % IV SOLN
8.0000 mg/h | INTRAVENOUS | Status: DC
  Administered 2020-07-20 – 2020-07-22 (×4): 8 mg/h via INTRAVENOUS
  Filled 2020-07-20 (×7): qty 80

## 2020-07-20 MED ORDER — LACTATED RINGERS IV BOLUS (SEPSIS)
1000.0000 mL | Freq: Once | INTRAVENOUS | Status: AC
  Administered 2020-07-20: 1000 mL via INTRAVENOUS

## 2020-07-20 MED ORDER — ONDANSETRON HCL 4 MG/2ML IJ SOLN
4.0000 mg | Freq: Four times a day (QID) | INTRAMUSCULAR | Status: DC | PRN
  Filled 2020-07-20 (×2): qty 2

## 2020-07-20 MED ORDER — ONDANSETRON HCL 4 MG/2ML IJ SOLN
4.0000 mg | Freq: Once | INTRAMUSCULAR | Status: AC
  Administered 2020-07-20: 4 mg via INTRAVENOUS
  Filled 2020-07-20: qty 2

## 2020-07-20 MED ORDER — ACETAMINOPHEN 325 MG PO TABS
650.0000 mg | ORAL_TABLET | Freq: Four times a day (QID) | ORAL | Status: DC | PRN
  Administered 2020-07-20: 650 mg via ORAL
  Filled 2020-07-20: qty 2

## 2020-07-20 MED ORDER — SODIUM CHLORIDE 0.9 % IV SOLN
80.0000 mg | Freq: Once | INTRAVENOUS | Status: AC
  Administered 2020-07-20: 80 mg via INTRAVENOUS
  Filled 2020-07-20: qty 80

## 2020-07-20 MED ORDER — SODIUM CHLORIDE 0.9 % IV SOLN
10.0000 mL/h | Freq: Once | INTRAVENOUS | Status: AC
  Administered 2020-07-21: 10 mL/h via INTRAVENOUS

## 2020-07-20 MED ORDER — LACTATED RINGERS IV BOLUS (SEPSIS): 500.0000 mL | Freq: Once | INTRAVENOUS | Status: DC

## 2020-07-20 NOTE — ED Notes (Signed)
Steinl MD made aware of hbg 6.9

## 2020-07-20 NOTE — Progress Notes (Signed)
Pharmacy Antibiotic Note  Deborah [name] is a 75 y.o. female admitted on 07/20/2020 with intra-abdominal infection.  Pharmacy has been consulted for cefepime dosing.  Presenting with N/V/diarrhea and LLQ abdominal pain for 2 days. WBC 10, afebrile. Scr 1.88 (CrCl 25 mL/min).   Of note, Hgb found to be 6.9 - no reports of rectal bleeding or melena. INR is 5.6- on warfarin PTA.   Plan: Cefepime 2g IV every 24 hours F/u continuing metronidazole 500 mg IV q8 hr Monitor renal fx, cx results, clinical pic  Height: 5\' 3"  (160 cm) Weight: 78 kg (171 lb 15.3 oz) IBW/kg (Calculated) : 52.4  Temp (24hrs), Avg:98.9 F (37.2 C), Min:98.9 F (37.2 C), Max:98.9 F (37.2 C)  Recent Labs  Lab 07/20/20 1831  WBC 10.8*  CREATININE 1.88*    Estimated Creatinine Clearance: 25.2 mL/min (A) (by C-G formula based on SCr of 1.88 mg/dL (H)).    Allergies  Allergen Reactions  . Strawberry Extract Rash    Rash & itching.    Antimicrobials this admission: Cefepime 4/11 >>  Metronidazole 4/11 >>   Dose adjustments this admission: N/A  Microbiology results: 4/11 BCx: sent 4/11 UCx: sent  4/11 COVIDPCR: sent  Thank you for allowing pharmacy to be a part of this patient's care.  6/11, PharmD, BCCCP Clinical Pharmacist  Phone: [phone] 07/20/2020 8:37 PM  Please check AMION for all [facility] phone numbers After 10:00 PM, call Main Pharmacy [phone]

## 2020-07-20 NOTE — ED Provider Notes (Signed)
[facility] EMERGENCY DEPARTMENT Provider Note   CSN: [id number] Arrival date & time: 07/20/20  1822     History Chief Complaint  Patient presents with  . Emesis    Deborah [name] is a 75 y.o. female.  Patient c/o nausea, vomiting, diarrhea, and mild abd cramping, for past two days. Symptoms acute onset, moderate-sev, constant, persistent, dull, non radiating. Denies known ill contacts or bad food ingestion. No recent new meds or antibiotic use. No dysuria or gu c/o. No back or flank pain. No headache. No neck pain or stiffness. No sore throat, cough or uri symptoms. No chest pain or sob. No extremity pain or injury. Denies recent blood loss, rectal bleeding or melena.   The history is provided by the patient.  Emesis Associated symptoms: diarrhea   Associated symptoms: no abdominal pain, no chills, no cough, no fever, no headaches and no sore throat        Past Medical History:  Diagnosis Date  . Aortic stenosis, mild    mean gradient echo 09/09  . CHF (congestive heart failure) (HCC)    --Non-ischemic CM EF 30-35% by echo 9/09 (Previous 25%)  --s/p St. Jude single chamber ICD 04/09 --Minimal  CAD by cath 2004 LAD 20% LCX 20% Ramus 30% RCA nl --echo 05/11 EF 20-25%  . Diabetes mellitus    type 2  . Hyperlipidemia   . Hypertension   . Kidney stones   . MR (congenital mitral regurgitation)    Severe MR s/p St. Jude mechanical MVR by Gasper Lloyd 2005    Patient Active Problem List   Diagnosis Date Noted  . Diabetes type 2, controlled (HCC)   . Chronic systolic CHF (congestive heart failure) (HCC)   . Pulmonary hypertension (HCC)   . History of mitral valve replacement with mechanical valve   . HLD (hyperlipidemia)   . Diabetes type 2, uncontrolled (HCC)   . Onychomycosis 01/03/2014  . Encounter for therapeutic drug monitoring 07/22/2013  . Valvular cardiomyopathy (HCC) 08/03/2012  . ICD-St.Jude 08/02/2010  . Long term (current) use of  anticoagulants 07/01/2010  . COPD, MILD 11/19/2009  . Aortic valve disorder 07/30/2008  . HYPERTENSION, BENIGN 01/24/2008  . Mitral valve disorder. (Status post mechanical valve replacement) 01/24/2008  . SYSTOLIC HEART FAILURE, CHRONIC 01/24/2008    Past Surgical History:  Procedure Laterality Date  . APPENDECTOMY  1979  . CARDIAC DEFIBRILLATOR PLACEMENT  2009  . CARDIAC VALVE SURGERY  2005  . ESOPHAGOGASTRODUODENOSCOPY N/A 12/05/2014   Procedure: ESOPHAGOGASTRODUODENOSCOPY (EGD);  Surgeon: Dorena Cookey, MD;  Location: [facility] ENDOSCOPY;  Service: Endoscopy;  Laterality: N/A;  . ICD GENERATOR CHANGEOUT N/A 08/01/2016   Procedure: ICD Generator Changeout;  Surgeon: Duke Salvia, MD;  Location: [facility] INVASIVE CV LAB;  Service: Cardiovascular;  Laterality: N/A;  . INSERT / REPLACE / REMOVE PACEMAKER       OB History   No obstetric history on file.     Family History  Problem Relation Age of Onset  . Heart disease Mother     Social History   Tobacco Use  . Smoking status: Former Smoker    Quit date: 06/16/1984    Years since quitting: 36.1  . Smokeless tobacco: Never Used  Substance Use Topics  . Alcohol use: No  . Drug use: No    Home Medications Prior to Admission medications   Medication Sig Start Date End Date Taking? Authorizing Provider  aspirin EC 81 MG tablet Take 81 mg by  mouth daily.    [provider]  carvedilol (COREG) 25 MG tablet Take 25 mg by mouth 2 (two) times daily with a meal.    [provider]  HYDROcodone-acetaminophen (NORCO) 7.5-325 MG tablet Take 1 tablet by mouth 2 (two) times daily as needed for moderate pain.  06/18/16   [provider]  ondansetron (ZOFRAN) 4 MG tablet Take 1 tablet (4 mg total) by mouth every 6 (six) hours as needed for nausea. 12/06/14   Dorothea OgleMyers, Iskra M, MD  sacubitril-valsartan (ENTRESTO) 97-103 MG Take 1 tablet by mouth 2 (two) times daily. Last refill without office visit. please call [phone] 10/18/19    Bensimhon, Bevelyn Bucklesaniel R, MD  temazepam (RESTORIL) 15 MG capsule Take 15 mg by mouth at bedtime as needed for sleep. 07/11/16   [provider]  warfarin (COUMADIN) 5 MG tablet Take 1 to 1.5 tablets daily as directed by Coumadin clinic 05/26/20   Duke SalviaKlein, Steven C, MD    Allergies    Strawberry extract  Review of Systems   Review of Systems  Constitutional: Negative for chills and fever.  HENT: Negative for sore throat.   Eyes: Negative for redness.  Respiratory: Negative for cough and shortness of breath.   Cardiovascular: Negative for chest pain.  Gastrointestinal: Positive for diarrhea, nausea and vomiting. Negative for abdominal pain.  Endocrine: Negative for polyuria.  Genitourinary: Negative for dysuria and flank pain.  Musculoskeletal: Negative for back pain, neck pain and neck stiffness.  Skin: Negative for rash.  Neurological: Positive for weakness and light-headedness. Negative for speech difficulty, numbness and headaches.  Hematological: Does not bruise/bleed easily.  Psychiatric/Behavioral: Negative for confusion.    Physical Exam Updated Vital Signs BP (!) 78/54 (BP Location: Left Arm)   Pulse 90   Temp 98.9 F (37.2 C) (Oral)   Resp 20   Ht 1.6 m (5\' 3" )   Wt 78 kg   SpO2 100%   BMI 30.46 kg/m   Physical Exam Vitals and nursing note reviewed.  Constitutional:      Appearance: She is well-developed.     Comments: Weak/frail appearing. bp low.   HENT:     Head: Atraumatic.     Nose: Nose normal.     Mouth/Throat:     Mouth: Mucous membranes are moist.     Pharynx: Oropharynx is clear. No oropharyngeal exudate or posterior oropharyngeal erythema.  Eyes:     General: No scleral icterus.    Pupils: Pupils are equal, round, and reactive to light.     Comments: Conj pale  Neck:     Vascular: No carotid bruit.     Trachea: No tracheal deviation.     Comments: No stiffness or rigidity Cardiovascular:     Rate and Rhythm: Normal rate and regular rhythm.      Pulses: Normal pulses.     Heart sounds: Normal heart sounds. No murmur heard. No friction rub. No gallop.   Pulmonary:     Effort: Pulmonary effort is normal. No respiratory distress.     Breath sounds: Normal breath sounds.  Abdominal:     General: Bowel sounds are normal. There is no distension.     Palpations: Abdomen is soft. There is no mass.     Tenderness: There is no guarding or rebound.     Hernia: No hernia is present.     Comments: Very mild mid to lower abd tenderness, no rebound or guarding.   Genitourinary:    Comments: No cva  tenderness. Black liquid stool - sent for hemoccult Musculoskeletal:        General: No swelling or tenderness.     Cervical back: Normal range of motion and neck supple. No rigidity. No muscular tenderness.     Right lower leg: No edema.     Left lower leg: No edema.  Skin:    General: Skin is warm and dry.     Findings: No rash.  Neurological:     Mental Status: She is alert.     Comments: Alert, speech normal. Motor/sens grossly intact bil.   Psychiatric:        Mood and Affect: Mood normal.     ED Results / Procedures / Treatments   Labs (all labs ordered are listed, but only abnormal results are displayed) Results for orders placed or performed during the hospital encounter of 07/20/20  CBC with Differential  Result Value Ref Range   WBC 10.8 (H) 4.0 - 10.5 K/uL   RBC 2.18 (L) 3.87 - 5.11 MIL/uL   Hemoglobin 6.9 (LL) 12.0 - 15.0 g/dL   HCT 11.9 (L) 14.7 - 82.9 %   MCV 100.9 (H) 80.0 - 100.0 fL   MCH 31.7 26.0 - 34.0 pg   MCHC 31.4 30.0 - 36.0 g/dL   RDW 56.2 13.0 - 86.5 %   Platelets 177 150 - 400 K/uL   nRBC 0.0 0.0 - 0.2 %   Neutrophils Relative % 86 %   Neutro Abs 9.2 (H) 1.7 - 7.7 K/uL   Lymphocytes Relative 9 %   Lymphs Abs 1.0 0.7 - 4.0 K/uL   Monocytes Relative 5 %   Monocytes Absolute 0.6 0.1 - 1.0 K/uL   Eosinophils Relative 0 %   Eosinophils Absolute 0.0 0.0 - 0.5 K/uL   Basophils Relative 0 %   Basophils  Absolute 0.0 0.0 - 0.1 K/uL   Immature Granulocytes 0 %   Abs Immature Granulocytes 0.04 0.00 - 0.07 K/uL  Comprehensive metabolic panel  Result Value Ref Range   Sodium 139 135 - 145 mmol/L   Potassium 4.2 3.5 - 5.1 mmol/L   Chloride 108 98 - 111 mmol/L   CO2 24 22 - 32 mmol/L   Glucose, Bld 271 (H) 70 - 99 mg/dL   BUN 77 (H) 8 - 23 mg/dL   Creatinine, Ser 7.84 (H) 0.44 - 1.00 mg/dL   Calcium 8.4 (L) 8.9 - 10.3 mg/dL   Total Protein 5.4 (L) 6.5 - 8.1 g/dL   Albumin 3.1 (L) 3.5 - 5.0 g/dL   AST 14 (L) 15 - 41 U/L   ALT 15 0 - 44 U/L   Alkaline Phosphatase 38 38 - 126 U/L   Total Bilirubin 0.5 0.3 - 1.2 mg/dL   GFR, Estimated 27 (L) >60 mL/min   Anion gap 7 5 - 15  Lipase, blood  Result Value Ref Range   Lipase 34 11 - 51 U/L  Protime-INR  Result Value Ref Range   Prothrombin Time 48.9 (H) 11.4 - 15.2 seconds   INR 5.6 (HH) 0.8 - 1.2  Lactic acid, plasma  Result Value Ref Range   Lactic Acid, Venous 2.4 (HH) 0.5 - 1.9 mmol/L  POC occult blood, ED Provider will collect  Result Value Ref Range   Fecal Occult Bld POSITIVE (A) NEGATIVE    EKG EKG Interpretation  Date/Time:  Monday July 20 2020 18:38:23 EDT Ventricular Rate:  91 PR Interval:  110 QRS Duration: 128 QT Interval:  402 QTC Calculation:  494 R Axis:   -89 Text Interpretation: Sinus rhythm with short PR with occasional Premature ventricular complexes Left axis deviation Non-specific intra-ventricular conduction block Non-specific ST-t changes Confirmed by Cathren Laine ([id number]) on 07/20/2020 6:56:47 PM   Radiology DG Chest Port 1 View  Result Date: 07/20/2020 CLINICAL DATA:  Possible sepsis EXAM: PORTABLE CHEST 1 VIEW COMPARISON:  04/11/2012 FINDINGS: Post sternotomy changes. Fracture through superior sternal wire as before. Left-sided single lead pacing device over the right ventricle. Cardiomegaly. Possible airspace disease at left base with poorly visible diaphragm. The right lung is clear. No pneumothorax.  IMPRESSION: 1. Possible airspace disease at the left base with poorly visible diaphragm. 2. Cardiomegaly. Electronically Signed   By: Jasmine Pang M.D.   On: 07/20/2020 19:57    Procedures Procedures   Medications Ordered in ED Medications  lactated ringers bolus 1,000 mL (has no administration in time range)    And  lactated ringers bolus 1,000 mL (has no administration in time range)    And  lactated ringers bolus 500 mL (has no administration in time range)  ceFEPIme (MAXIPIME) 2 g in sodium chloride 0.9 % 100 mL IVPB (has no administration in time range)  metroNIDAZOLE (FLAGYL) IVPB 500 mg (has no administration in time range)    ED Course  I have reviewed the triage vital signs and the nursing notes.  Pertinent labs & imaging results that were available during my care of the patient were reviewed by me and considered in my medical decision making (see chart for details).    MDM Rules/Calculators/A&P                          Iv LR bolus 30 cc/kg as bp low. Continuous pulse ox and cardiac monitoring. Stat labs and imaging.   Reviewed nursing notes and prior charts for additional history.   Cultures sent. Iv antibiotics given.   2nd large bore iv ordered/placed.   Labs reviewed/interpreted by me - HGB 6.9, v low and decreased from prior. Type and screen, transfuse prbc stat. Stool is black/liquid. Bun elev. Aki. 2016 had EGD with duodenitis/duodenal ulcer - protonix bolus and gtt.   CXR reviewed/interpreted by me - no def pna.   GI consulted. Discussed pt with GI on call, Dr Chales Abrahams, including vitals, low bp, drops in hgb now 6.9, coumadin and current inr, hx mech mitral valve - he agrees with protonix bolus/gtt and transfusion 2 units prbc, states would hold coumadin for now, but not give vit k, or other reversal, let inr drift down, when inr 2, start heparin, they will plan EGD accordingly, perhaps tomorrow, keep npo, hospitalist admission.   Medicine consulted for  admission. Discussed pt with hospitalist - will admission.  CRITICAL CARE RE: acute gi bleeding with hypotension, symptomatic anemia. And warfarin-related coagulopathy, Requiring emergent transfusion, gi consult.  Performed by: Suzi Roots Total critical care time: 95 minutes Critical care time was exclusive of separately billable procedures and treating other patients. Critical care was necessary to treat or prevent imminent or life-threatening deterioration. Critical care was time spent personally by me on the following activities: development of treatment plan with patient and/or surrogate as well as nursing, discussions with consultants, evaluation of patient's response to treatment, examination of patient, obtaining history from patient or surrogate, ordering and performing treatments and interventions, ordering and review of laboratory studies, ordering and review of radiographic studies, pulse oximetry and re-evaluation of patient's condition.  Final Clinical Impression(s) / ED Diagnoses Final diagnoses:  None    Rx / DC Orders ED Discharge Orders    None       Cathren Laine, MD 07/20/20 2124

## 2020-07-20 NOTE — ED Triage Notes (Addendum)
Emergency Medicine Provider Triage Evaluation Note  Deborah [name] , a 75 y.o. female  was evaluated in triage.  Pt complains of nausea, vomiting, abdominal pain and diarrhea.  Localized to her lower abdomen however worse to her left lower quadrant.  Initially she felt constipated and took a laxative.  Had subsequent multiple episodes of diarrhea without melena or bright red blood per rectum.  She is not any blood thinners.  No recent sick contacts.  No recent antibiotics or travel  Review of Systems  Positive: abd pain, emesis, diarrhea Negative: Chest pain, shortness of breath, fever  Physical Exam  There were no vitals taken for this visit. Gen:   Awake, no distress   HEENT:  Atraumatic  Resp:  Normal effort  Cardiac:  Normal rate  Abd:   Nondistended, tenderness to LLQ MSK:   Moves extremities without difficulty  Neuro:  Soft speech   Medical Decision Making  Medically screening exam initiated at 6:29 PM.  Appropriate orders placed.  Deborah [name] was informed that the remainder of the evaluation will be completed by another provider, this initial triage assessment does not replace that evaluation, and the importance of remaining in the ED until their evaluation is complete.    Patient hypotensive.  Intermittently somnolent.  She has distal pulses.   Nursing notified patient needs room in back  Clinical Impression  abd pain, emesis, diarrhea      Deborah [name] A, PA-C 07/20/20 1833    Wille Aubuchon A, PA-C 07/20/20 1845

## 2020-07-20 NOTE — ED Notes (Signed)
[phone] pt son would like a update please

## 2020-07-20 NOTE — Sepsis Progress Note (Signed)
Notified bedside nurse of need to draw repeat lactic acid. 

## 2020-07-20 NOTE — ED Notes (Addendum)
Howerter MD made aware pt currently has temp of 100. RN told to give 650mg  tylenol and continue blood. Pt denies chills other concerns. Asking for water.

## 2020-07-20 NOTE — Sepsis Progress Note (Signed)
Following for Code Sepsis  

## 2020-07-20 NOTE — H&P (Signed)
History and Physical    PLEASE NOTE THAT DRAGON DICTATION SOFTWARE WAS USED IN THE CONSTRUCTION OF THIS NOTE.   Deborah [name] OYD:[id number] DOB: 19-Dec-1945 DOA: 07/20/2020  PCP: Merrilee Seashore, MD Patient coming from: home   I have personally briefly reviewed patient's old medical records in [place]  Chief Complaint: Nausea vomiting  HPI: Deborah [name] is a 75 y.o. female with medical history significant for chronic systolic heart failure with most recent EF 20 to 25% per echocardiogram in March 2020, severe mitral regurgitation status post mechanical mitral valve replacement 2005, type 2 diabetes mellitus, hypertension, anemia of chronic disease with baseline hemoglobin of 11, who is admitted to [facility] on 07/20/2020 with acute upper gastrointestinal bleed after presenting from home to [facility] ED complaining of nausea and vomiting.   The patient reports intermittent nausea over the last 2 days resulting in a total of 3-4 episodes of nonbloody, nonbilious emesis over that time.  She notes associated generalized dull, crampy abdominal discomfort that started after the first episode of emesis.  She reports that this abdominal discomfort is nonradiating, and worsens with episodes of vomiting, as well as palpation over the abdomen.  She notes an episode of loose stool over the last day, but denies any recent melena or hematochezia.  Denies any recent trauma.   She denies any associated chest pain, shortness breath, palpitations, diaphoresis, dizziness, presyncope, or syncope.  Not associate with any subjective fever, chills, rigors, or generalized myalgias.  Denies any recent dysuria, gross hematuria, or change in urinary urgency/frequency.  She notes generalized weakness over the last 1 to 2 days in the absence of any acute focal weakness.  She also denies any associated acute focal paresthesias, numbness, dysarthria, facial droop, dysphagia, vertigo, or acute change in  vision.  The patient has a history of severe mitral regurgitation status post [facility] Jude mechanical valve replacement in 2005.  In the setting, she is chronically anticoagulated on warfarin.  Per chart review, the patient has a history of chronic systolic heart failure status post AICD in 2009, with most recent echocardiogram in March 2020 showing LVEF 20 to 25%, mildly dilated left ventricle, diffuse hypokinesis, mild mitral regurgitation.  Not on any diuretic medications at home.  In addition to chronic anticoagulation on warfarin, it appears that the patient is also on a daily baby aspirin.  Otherwise, she is on no blood thinning agents at home.  Denies any recent NSAID use.  No history of alcohol abuse, no known history of chronic liver disease.  Per chart review, it appears that the patient has a prior history of acute upper gastrointestinal bleed in August 2016, at which time she underwent EGD which reportedly demonstrated evidence of duodenitis as well as a potential duodenal ulcer.     ED Course:  Vital signs in the ED were notable for the following: Temperature max 100.0; heart rate 62-90; initial blood pressure 88/52, which improved to 107/38 following initiation of PRBC transfusion, as further described low; respiratory rate 17-24; oxygen saturation 97 to 100% on room air.  Labs were notable for the following: CMP was notable for the following: Sodium 136, potassium 4.2, BUN 77 relative to most recent prior value of 20 in October 2020, creatinine 1.88 relative to most recent prior serum creatinine data point of 1.16 in October 2020, glucose 271.  CBC was notable for the following: White blood cell count 10,800, hemoglobin 6.9 with MCV 100.9, normochromic finding, and nonelevated RDW of 12.8,  which is relative to most recent prior hemoglobin of 10.8 in June 2019.  Today CBC was also normal for platelet count of 177.  Presenting INR 5.5.  Fecal occult blood testing in the ED found to be  positive.  Urinalysis has been ordered, with result currently pending.  Screening nasopharyngeal COVID-19/influenza PCR was performed in the ED today, with result currently pending.  Blood cultures x2 were collected prior to initiation of any antibiotics.  EKG shows sinus rhythm with PVC, ventricular rate 91, and intraventricular conduction block, with nonspecific T wave inversion in aVL and no evidence of ST changes, including no evidence of ST elevation.  Chest x-ray showed cardiomegaly and potential airspace disease at the left base, but otherwise showed no evidence of acute cardiopulmonary process, including no evidence of edema or pneumothorax.  The patient's case was discussed with the on-call gastroenterologist, Dr. Lyndel Safe, who will formally consult.  He recommends holding warfarin but refraining from reversal of presenting supratherapeutic INR, and also recommends Protonix drip been PRBC transfusion overnight. Dr. Lyndel Safe to evaluate patient for potential EGD.   While in the ED, the following were administered: Cefepime 2 g IV x1, IV Flagyl x1 dose.  Zofran 4 mg IV x1.  Lactated Ringer's x2 L bolus, Protonix 80 mg IV x1 followed by initiation of Protonix drip.  Additionally, the patient was typed and screened for 2 units PBC, with initiation of transfusion of first such unit.     Review of Systems: As per HPI otherwise 10 point review of systems negative.   Past Medical History:  Diagnosis Date  . Aortic stenosis, mild    mean gradient 5mHg echo 09/09  . CHF (congestive heart failure) (HCC)    --Non-ischemic CM EF 30-35% by echo 9/09 (Previous 25%)  --s/p St. Jude single chamber ICD 04/09 --Minimal  CAD by cath 2004 LAD 20% LCX 20% Ramus 30% RCA nl --echo 05/11 EF 20-25%  . Diabetes mellitus    type 2  . Hyperlipidemia   . Hypertension   . Kidney stones   . MR (congenital mitral regurgitation)    Severe MR s/p St. Jude mechanical MVR by [facility]2005    Past Surgical History:   Procedure Laterality Date  . APPENDECTOMY  1979  . CARDIAC DEFIBRILLATOR PLACEMENT  2009  . CARDIAC VALVE SURGERY  2005  . ESOPHAGOGASTRODUODENOSCOPY N/A 12/05/2014   Procedure: ESOPHAGOGASTRODUODENOSCOPY (EGD);  Surgeon: JTeena Irani MD;  [facility];  Service: Endoscopy;  Laterality: N/A;  . ICD GENERATOR CHANGEOUT N/A 08/01/2016   Procedure: ICD Generator Changeout;  Surgeon: SDeboraha Sprang MD;  [facility];  Service: Cardiovascular;  Laterality: N/A;  . INSERT / REPLACE / REMOVE PACEMAKER      Social History:  reports that she quit smoking about 36 years ago. She has never used smokeless tobacco. She reports that she does not drink alcohol and does not use drugs.   Allergies  Allergen Reactions  . Strawberry Extract Rash    Rash & itching.    Family History  Problem Relation Age of Onset  . Heart disease Mother     Family history reviewed and not pertinent    Prior to Admission medications   Medication Sig Start Date End Date Taking? Authorizing Provider  aspirin EC 81 MG tablet Take 81 mg by mouth daily.    [provider]  carvedilol (COREG) 25 MG tablet Take 25 mg by mouth 2 (two) times daily with a meal.  [provider]  HYDROcodone-acetaminophen (NORCO) 7.5-325 MG tablet Take 1 tablet by mouth 2 (two) times daily as needed for moderate pain.  06/18/16   [provider]  ondansetron (ZOFRAN) 4 MG tablet Take 1 tablet (4 mg total) by mouth every 6 (six) hours as needed for nausea. 12/06/14   Theodis Blaze, MD  sacubitril-valsartan (ENTRESTO) 97-103 MG Take 1 tablet by mouth 2 (two) times daily. Last refill without office visit. please call [phone] 10/18/19   Bensimhon, Shaune Pascal, MD  temazepam (RESTORIL) 15 MG capsule Take 15 mg by mouth at bedtime as needed for sleep. 07/11/16   [provider]  warfarin (COUMADIN) 5 MG tablet Take 1 to 1.5 tablets daily as directed by Coumadin clinic 05/26/20   Deboraha Sprang, MD     Objective    Physical Exam: Vitals:   07/20/20 1830 07/20/20 1836 07/20/20 2141 07/20/20 2145  BP:  (!) 78/54 (!) 107/38 (!) 94/31  Pulse:  90 81 81  Resp:  20  19  Temp:  98.9 F (37.2 C) 98.7 F (37.1 C)   TempSrc:  Oral Oral   SpO2:  100% 100% 95%  Weight: 78 kg     Height: '5\' 3"'  (1.6 m)       General: appears to be stated age; alert, oriented Skin: warm, dry, no rash Head:  AT/ Mouth:  Oral mucosa membranes appear dry, normal dentition Neck: supple; trachea midline Heart:  RRR; did not appreciate any M/R/G Lungs: CTAB, did not appreciate any wheezes, rales, or rhonchi Abdomen: + BS; soft, ND, NT Vascular: 2+ pedal pulses b/l; 2+ radial pulses b/l Extremities: no peripheral edema, no muscle wasting Neuro: strength and sensation intact in upper and lower extremities b/l   Labs on Admission: I have personally reviewed following labs and imaging studies  CBC: Recent Labs  Lab 07/20/20 1831  WBC 10.8*  NEUTROABS 9.2*  HGB 6.9*  HCT 22.0*  MCV 100.9*  PLT 419   Basic Metabolic Panel: Recent Labs  Lab 07/20/20 1831  NA 139  K 4.2  CL 108  CO2 24  GLUCOSE 271*  BUN 77*  CREATININE 1.88*  CALCIUM 8.4*   GFR: Estimated Creatinine Clearance: 25.2 mL/min (A) (by C-G formula based on SCr of 1.88 mg/dL (H)). Liver Function Tests: Recent Labs  Lab 07/20/20 1831  AST 14*  ALT 15  ALKPHOS 38  BILITOT 0.5  PROT 5.4*  ALBUMIN 3.1*   Recent Labs  Lab 07/20/20 1831  LIPASE 34   No results for input(s): AMMONIA in the last 168 hours. Coagulation Profile: Recent Labs  Lab 07/20/20 1858  INR 5.6*   Cardiac Enzymes: No results for input(s): CKTOTAL, CKMB, CKMBINDEX, TROPONINI in the last 168 hours. BNP (last 3 results) No results for input(s): PROBNP in the last 8760 hours. HbA1C: No results for input(s): HGBA1C in the last 72 hours. CBG: No results for input(s): GLUCAP in the last 168 hours. Lipid Profile: No results for  input(s): CHOL, HDL, LDLCALC, TRIG, CHOLHDL, LDLDIRECT in the last 72 hours. Thyroid Function Tests: No results for input(s): TSH, T4TOTAL, FREET4, T3FREE, THYROIDAB in the last 72 hours. Anemia Panel: Recent Labs    07/20/20 1952  RETICCTPCT 3.4*   Urine analysis:    Component Value Date/Time   COLORURINE YELLOW 05/10/2011 1440   APPEARANCEUR CLEAR 05/10/2011 1440   LABSPEC 1.019 05/10/2011 1440   PHURINE 7.0 05/10/2011 1440   GLUCOSEU NEGATIVE 05/10/2011 1440   HGBUR NEGATIVE  05/10/2011 1440   BILIRUBINUR NEGATIVE 05/10/2011 1440   KETONESUR TRACE (A) 05/10/2011 1440   PROTEINUR 100 (A) 05/10/2011 1440   UROBILINOGEN 1.0 05/10/2011 1440   NITRITE NEGATIVE 05/10/2011 1440   LEUKOCYTESUR NEGATIVE 05/10/2011 1440    Radiological Exams on Admission: DG Chest Port 1 View  Result Date: 07/20/2020 CLINICAL DATA:  Possible sepsis EXAM: PORTABLE CHEST 1 VIEW COMPARISON:  04/11/2012 FINDINGS: Post sternotomy changes. Fracture through superior sternal wire as before. Left-sided single lead pacing device over the right ventricle. Cardiomegaly. Possible airspace disease at left base with poorly visible diaphragm. The right lung is clear. No pneumothorax. IMPRESSION: 1. Possible airspace disease at the left base with poorly visible diaphragm. 2. Cardiomegaly. Electronically Signed   By: Donavan Foil M.D.   On: 07/20/2020 19:57     EKG: Independently reviewed, with result as described above.    Assessment/Plan   Deborah [name] is a 75 y.o. female with medical history significant for chronic systolic heart failure with most recent EF 20 to 25% per echocardiogram in March 2020, severe mitral regurgitation status post mechanical mitral valve replacement 2005, type 2 diabetes mellitus, hypertension, anemia of chronic disease with baseline hemoglobin of 11, who is admitted to [facility] on 07/20/2020 with acute upper gastrointestinal bleed after presenting from home to [facility] ED complaining  of nausea and vomiting.    Principal Problem:   Acute upper GI bleed Active Problems:   Mitral valve disorder. (Status post mechanical valve replacement)   SYSTOLIC HEART FAILURE, CHRONIC   Long term (current) use of anticoagulants   Acute on chronic anemia   AKI (acute kidney injury) ([place])   Lactic acidosis       #) Acute Upper GI Bleed: diagnosis on the basis of presenting acute on chronic anemia, as further quantified below, with elevated BUN, and rectal exam revealing fecal occult positive finding.  Patient is chronically anticoagulated on warfarin in the setting of mechanical mitral valve replacement, and appears to also be on a daily baby aspirin.  Denies any NSAID use.  No history of known underlying liver disease and denies any history of alcohol abuse or recent alcohol consumption.  Of note, has a history of acute upper gastrointestinal bleed in August 2016, with prior EGD that time reportedly revealing duodenitis and potential duodenal ulcer.  Currently, differential includes peptic ulcer disease versus gastritis versus erosive esophagitis versus AVMs.   In the absence of known liver disease, initiation of SBP prophylaxis does not appear to be warranted. Presentation and history are less suggestive of variceal bleed, and therefore there does not appear to be an indication for octreotide.  PRBC transfusion was initiated in the ED, as further detailed below.  Of note, in the setting of history of chronic systolic heart failure, will need to closely monitor for ensuing development of acute on chronic heart failure given plan for PRBC transfusion.  Fortunately, the patient has exhibited 2 days of intermittent nausea/vomiting, further exacerbating her presenting intravascular depletion, with no evidence of acutely decompensated heart failure at the present time.  The patient's case was discussed with the on-call gastroenterologist, Dr. Lyndel Safe, who will formally consult.  He recommends  holding warfarin but refraining from reversal of presenting supratherapeutic INR, and also recommends Protonix drip been PRBC transfusion overnight. Dr. Lyndel Safe to evaluate patient for potential EGD.        Plan: NPO. Refraining from pharmacologic DVT prophylaxis. Monitor on telemetry. Monitor continuous pulse-ox. Maintain at least 2 large bore IV's.  Repeat INR in the AM. Q4H H&H's have been ordered through 1300 tomorrow (07/21/20).  Continue transfusion of 2 units PRBC, with repeat hemoglobin ordered to be checked following completion of transfusion of the second unit.  In the setting of underlying anticoagulation and suspected upper source for patient's gastrointestinal bleed, I have asked the blood bank to stay 2 units PRBC head. Dr. Lyndel Safe of GI formally consulted, as above.  Protonix drip.  Additional evaluation management of presenting acute on chronic anemia, as further described below.  Hold home Coreg and Entresto for now.       #) Acute on chronic anemia: In the setting of a history of chronic anemia, believed to be on the basis of chronic disease, with baseline hemoglobin 11.0, with most recent prior hemoglobin noted to be 10.8 in June 2019, presenting hemoglobin found to be low relative to this baseline, with presenting value of 6.9.  Suspect acute exacerbation of patient's chronic anemia on the basis of acute blood loss given suspected presenting acute upper gastrointestinal bleed, as further described above.  Initial blood pressure was found to be soft, although improving following initiation of PRBC transfusion, with need for ensuing caution for ongoing evaluation for development of acute volume overload given history of chronic systolic heart failure, as further detailed above.  Aside from mild nausea, the patient is currently asymptomatic.   Plan: work-up and management for presenting suspected acute upper GI bleed, as above, including close monitoring of Q4H H&H's.  Continue  transfusion of 2 units PRBC initiated in the ED.  Blood bank asked to stay 2 units PRBC ahead.  Repeat INR in the morning.  Hold on aspirin.  Hold home warfarin without reversal at this time, per discussions with gastroenterology, as above.  Monitor on telemetry.  Monitor continuous pulse oximetry.  NPO.  Refraining from pharmacologic DVT prophylaxis.  Formal GI consult, as above.  Add on the following laboratory studies to pretransfusion labs: Iron studies, MMA, folic acid, reticulocyte count.       #) Lactic acidosis: Presenting lactate noted to be mildly elevated 2.4.  Suspect that this is on the basis of generalized tissue hypoperfusion in the setting of diminished oxygen carrying capacity as well as decline in baseline oxygen delivery capacity all in the context of presenting acute upper gastrointestinal bleed with ensuing acute on chronic anemia, as further detailed above.  Of note, criteria for sepsis are not met at this time.  There may also be a contribution to presenting lactic acidosis from acute kidney injury, as further detailed below.  Plan: Work-up and management of acute on chronic anemia in the setting of acute upper gastrointestinal bleed, including PRBC transfusion, as above.  Repeat lactic acid level in the morning.  Repeat BMP in the morning.  Follow-up result of urinalysis.  Recommend management of acute kidney injury, as further described below. Work-up and management of potential left basilar airspace opacity identified on CXR, as further detailed below.       #) Acute Kidney Injury: Presenting creatinine 1.88 relative to most recent prior value of 1.16 in October 2020.  Suspect that this is prerenal in nature the setting of intravascular depletion due to multifactorial contributions including acute on chronic blood loss anemia, as further detailed above, as well as contribution from increased GI losses as a result of 2 days of intermittent nausea/vomiting.  Urinalysis has been  ordered, with result currently pending.   Plan: monitor strict I's & O's and daily weights. Attempt to avoid  nephrotoxic agents. Refrain from NSAIDs. Repeat BMP in the morning. Check serum magnesium level.  Follow for results of urinalysis, with attention for the presence of urinary casts. Check random urine sodium and random urine creatinine.  Work-up and management of acute on chronic anemia, including PRBC transfusion, as above.       #) Left basilar airspace opacity: Present chest x-ray shows cardiomegaly with potential airspace opacity in the left base, but otherwise demonstrates no evidence of acute cardiopulmonary process.  Clinically, there is currently limited evidence to suggest underlying pneumonia, including the patient remaining asymptomatic from a respiratory standpoint.  Additionally, SIRS criteria for sepsis are not currently met. However, given initial presenting hypotension, with most likely on the basis of acute on chronic anemia in the setting of acute upper GI bleed, with further evaluate from concomitant contribution from underlying pneumonia with initially treating with broad-spectrum antibiotics, as further detailed below.   Plan: Add on procalcitonin.  Monitor on continuous pulse oximetry.  Given recent nausea/vomiting, will check Legionella urine antigen.  Add on strep pneumoniae urine antigen.  Repeat CBC with differential in the morning.  Continue cefepime. Pharmacy consult for IV Vanc.  Check MRSA PCR, with plan to discontinue IV Vanco if this result was found to be negative.      #) Chronic systolic heart failure: documented history of such, with most recent echocardiogram performed in March 2020, with result as further detailed above. No clinical evidence to suggest acutely decompensated heart failure at this time, although the patient is at risk for development of such in the setting of PRBC transfusion in the setting of presenting acute upper GI bleed, as above.  Presenting EKG shows sinus rhythm with evidence of acute ischemic changes. Patient's home cardiac medications also include the following: Entresto, Coreg.  Does not appear to be on any diuretic medications at home.   Plan: monitor strict I's & O's and daily weights. Repeat BMP in the morning. Check serum magnesium level.  Monitor on telemetry.  Monitor continuous pulse oximetry.  Hold home Entresto and Coreg for now in the setting of acute upper GI bleed.      #) Severe mitral regurgitation: Status post [employer] Jude mechanical mitral valve replacement in 2005.  Chronically anticoagulated on warfarin, with supra therapeutic INR, as quantified above.  Per discussions with gastroenterology in setting of presenting acute upper GI bleed, will hold Warfarin while currently refraining from INR reversal, as further detailed above.  Plan: Hold home warfarin for now.  Repeat INR in the morning.      DVT prophylaxis: scd's  Code Status: Full code Family Communication: none Disposition Plan: Per Rounding Team Consults called: case discussed with on-call GI, Dr. Lyndel Safe, as above.   Admission status: inpatient; pcu   Of note, this patient was added by me to the following Admit List/Treatment Team:  mcadmits.     PLEASE NOTE THAT DRAGON DICTATION SOFTWARE WAS USED IN THE CONSTRUCTION OF THIS NOTE.   Rhetta Mura DO [facility] Pager [phone] From [place]   07/20/2020, 9:58 PM

## 2020-07-20 NOTE — ED Notes (Signed)
Steinl MD discussed pt receiving blood during stay. Pt denied questions or concerns @ this time agrees to sign consent for blood. States MD answer all questions.

## 2020-07-20 NOTE — ED Notes (Signed)
MD iinformed RN to increase rate so blood product finishes in next 30 min

## 2020-07-20 NOTE — ED Notes (Signed)
Howerter aware pt's bp has dropped to mid 80's. Pt denies s/s @ this time states "I feel better."

## 2020-07-20 NOTE — ED Notes (Signed)
Provider aware of lactate 2.4 and INR 5.6

## 2020-07-20 NOTE — ED Triage Notes (Signed)
Pt coming from home. She has had nausea, vomiting, abdominal pain and diarrhea for a few days.

## 2020-07-21 ENCOUNTER — Other Ambulatory Visit (HOSPITAL_COMMUNITY): Payer: Medicare HMO

## 2020-07-21 ENCOUNTER — Encounter (HOSPITAL_COMMUNITY): Payer: Self-pay | Admitting: Internal Medicine

## 2020-07-21 ENCOUNTER — Other Ambulatory Visit: Payer: Self-pay

## 2020-07-21 DIAGNOSIS — K922 Gastrointestinal hemorrhage, unspecified: Secondary | ICD-10-CM

## 2020-07-21 DIAGNOSIS — R791 Abnormal coagulation profile: Secondary | ICD-10-CM

## 2020-07-21 DIAGNOSIS — Z952 Presence of prosthetic heart valve: Secondary | ICD-10-CM

## 2020-07-21 DIAGNOSIS — N179 Acute kidney failure, unspecified: Secondary | ICD-10-CM | POA: Diagnosis present

## 2020-07-21 DIAGNOSIS — I428 Other cardiomyopathies: Secondary | ICD-10-CM | POA: Diagnosis not present

## 2020-07-21 DIAGNOSIS — I5022 Chronic systolic (congestive) heart failure: Secondary | ICD-10-CM

## 2020-07-21 DIAGNOSIS — D649 Anemia, unspecified: Secondary | ICD-10-CM | POA: Diagnosis present

## 2020-07-21 DIAGNOSIS — Z7901 Long term (current) use of anticoagulants: Secondary | ICD-10-CM

## 2020-07-21 DIAGNOSIS — E872 Acidosis, unspecified: Secondary | ICD-10-CM | POA: Diagnosis present

## 2020-07-21 LAB — HEMOGLOBIN AND HEMATOCRIT, BLOOD
HCT: 31.1 % — ABNORMAL LOW (ref 36.0–46.0)
HCT: 36.5 % (ref 36.0–46.0)
Hemoglobin: 9.6 g/dL — ABNORMAL LOW (ref 12.0–15.0)
Hemoglobin: 12.3 g/dL (ref 12.0–15.0)

## 2020-07-21 LAB — CBC
HCT: 33.3 % — ABNORMAL LOW (ref 36.0–46.0)
HCT: 32 % — ABNORMAL LOW (ref 36.0–46.0)
Hemoglobin: 11.1 g/dL — ABNORMAL LOW (ref 12.0–15.0)
Hemoglobin: 10.7 g/dL — ABNORMAL LOW (ref 12.0–15.0)
MCH: 30.7 pg (ref 26.0–34.0)
MCH: 30.8 pg (ref 26.0–34.0)
MCHC: 33.3 g/dL (ref 30.0–36.0)
MCHC: 33.4 g/dL (ref 30.0–36.0)
MCV: 92 fL (ref 80.0–100.0)
MCV: 92.2 fL (ref 80.0–100.0)
Platelets: 105 10*3/uL — ABNORMAL LOW (ref 150–400)
Platelets: 113 10*3/uL — ABNORMAL LOW (ref 150–400)
RBC: 3.62 MIL/uL — ABNORMAL LOW (ref 3.87–5.11)
RBC: 3.47 MIL/uL — ABNORMAL LOW (ref 3.87–5.11)
RDW: 16.1 % — ABNORMAL HIGH (ref 11.5–15.5)
RDW: 16.4 % — ABNORMAL HIGH (ref 11.5–15.5)
WBC: 10.9 10*3/uL — ABNORMAL HIGH (ref 4.0–10.5)
WBC: 10.1 10*3/uL (ref 4.0–10.5)
nRBC: 0.2 % (ref 0.0–0.2)
nRBC: 0.3 % — ABNORMAL HIGH (ref 0.0–0.2)

## 2020-07-21 LAB — COMPREHENSIVE METABOLIC PANEL
ALT: 13 U/L (ref 0–44)
AST: 16 U/L (ref 15–41)
Albumin: 3 g/dL — ABNORMAL LOW (ref 3.5–5.0)
Alkaline Phosphatase: 35 U/L — ABNORMAL LOW (ref 38–126)
Anion gap: 7 (ref 5–15)
BUN: 64 mg/dL — ABNORMAL HIGH (ref 8–23)
CO2: 23 mmol/L (ref 22–32)
Calcium: 8.2 mg/dL — ABNORMAL LOW (ref 8.9–10.3)
Chloride: 114 mmol/L — ABNORMAL HIGH (ref 98–111)
Creatinine, Ser: 1.6 mg/dL — ABNORMAL HIGH (ref 0.44–1.00)
GFR, Estimated: 33 mL/min — ABNORMAL LOW (ref >60)
Glucose, Bld: 188 mg/dL — ABNORMAL HIGH (ref 70–99)
Potassium: 4.2 mmol/L (ref 3.5–5.1)
Sodium: 144 mmol/L (ref 135–145)
Total Bilirubin: 0.7 mg/dL (ref 0.3–1.2)
Total Protein: 5 g/dL — ABNORMAL LOW (ref 6.5–8.1)

## 2020-07-21 LAB — CBC WITH DIFFERENTIAL/PLATELET
Abs Immature Granulocytes: 0.07 10*3/uL (ref 0.00–0.07)
Basophils Absolute: 0 10*3/uL (ref 0.0–0.1)
Basophils Relative: 0 %
Eosinophils Absolute: 0.2 10*3/uL (ref 0.0–0.5)
Eosinophils Relative: 1 %
HCT: 36.3 % (ref 36.0–46.0)
Hemoglobin: 12.2 g/dL (ref 12.0–15.0)
Immature Granulocytes: 1 %
Lymphocytes Relative: 10 %
Lymphs Abs: 1.1 10*3/uL (ref 0.7–4.0)
MCH: 31 pg (ref 26.0–34.0)
MCHC: 33.6 g/dL (ref 30.0–36.0)
MCV: 92.1 fL (ref 80.0–100.0)
Monocytes Absolute: 1.1 10*3/uL — ABNORMAL HIGH (ref 0.1–1.0)
Monocytes Relative: 9 %
Neutro Abs: 9.3 10*3/uL — ABNORMAL HIGH (ref 1.7–7.7)
Neutrophils Relative %: 79 %
Platelets: 107 10*3/uL — ABNORMAL LOW (ref 150–400)
RBC: 3.94 MIL/uL (ref 3.87–5.11)
RDW: 16.1 % — ABNORMAL HIGH (ref 11.5–15.5)
WBC: 11.7 10*3/uL — ABNORMAL HIGH (ref 4.0–10.5)
nRBC: 0.3 % — ABNORMAL HIGH (ref 0.0–0.2)

## 2020-07-21 LAB — URINALYSIS, ROUTINE W REFLEX MICROSCOPIC
Bilirubin Urine: NEGATIVE
Glucose, UA: NEGATIVE mg/dL
Ketones, ur: 5 mg/dL — AB
Nitrite: NEGATIVE
Protein, ur: NEGATIVE mg/dL
Specific Gravity, Urine: 1.014 (ref 1.005–1.030)
pH: 5 (ref 5.0–8.0)

## 2020-07-21 LAB — GLUCOSE, CAPILLARY
Glucose-Capillary: 142 mg/dL — ABNORMAL HIGH (ref 70–99)
Glucose-Capillary: 151 mg/dL — ABNORMAL HIGH (ref 70–99)
Glucose-Capillary: 155 mg/dL — ABNORMAL HIGH (ref 70–99)
Glucose-Capillary: 214 mg/dL — ABNORMAL HIGH (ref 70–99)
Glucose-Capillary: 178 mg/dL — ABNORMAL HIGH (ref 70–99)

## 2020-07-21 LAB — PREPARE RBC (CROSSMATCH)

## 2020-07-21 LAB — CREATININE, URINE, RANDOM: Creatinine, Urine: 91.08 mg/dL

## 2020-07-21 LAB — TSH: TSH: 0.272 u[IU]/mL — ABNORMAL LOW (ref 0.350–4.500)

## 2020-07-21 LAB — PROCALCITONIN: Procalcitonin: <0.1 ng/mL

## 2020-07-21 LAB — PROTIME-INR
INR: 5.8 (ref 0.8–1.2)
INR: 7.6 (ref 0.8–1.2)
Prothrombin Time: 51.9 seconds — ABNORMAL HIGH (ref 11.4–15.2)
Prothrombin Time: 64.7 seconds — ABNORMAL HIGH (ref 11.4–15.2)

## 2020-07-21 LAB — LACTIC ACID, PLASMA: Lactic Acid, Venous: 1.8 mmol/L (ref 0.5–1.9)

## 2020-07-21 LAB — SODIUM, URINE, RANDOM: Sodium, Ur: 41 mmol/L

## 2020-07-21 LAB — MAGNESIUM
Magnesium: 1.7 mg/dL (ref 1.7–2.4)
Magnesium: 1.8 mg/dL (ref 1.7–2.4)

## 2020-07-21 LAB — STREP PNEUMONIAE URINARY ANTIGEN: Strep Pneumo Urinary Antigen: NEGATIVE

## 2020-07-21 MED ORDER — SODIUM CHLORIDE 0.9% IV SOLUTION: Freq: Once | INTRAVENOUS | Status: AC

## 2020-07-21 MED ORDER — VANCOMYCIN HCL 750 MG/150ML IV SOLN
750.0000 mg | INTRAVENOUS | Status: DC
  Administered 2020-07-21: 750 mg via INTRAVENOUS
  Filled 2020-07-21: qty 150

## 2020-07-21 MED ORDER — SODIUM CHLORIDE 0.9 % IV SOLN: 2.0000 g | INTRAVENOUS | Status: DC

## 2020-07-21 MED ORDER — INSULIN ASPART 100 UNIT/ML ~~LOC~~ SOLN
0.0000 [IU] | Freq: Four times a day (QID) | SUBCUTANEOUS | Status: DC
  Administered 2020-07-21: 2 [IU] via SUBCUTANEOUS
  Administered 2020-07-21 – 2020-07-23 (×5): 1 [IU] via SUBCUTANEOUS

## 2020-07-21 NOTE — Progress Notes (Signed)
INR 5.8. Provider notified

## 2020-07-21 NOTE — ED Notes (Addendum)
Pt taken upstairs via stretcher. Protnix drip infusing along w/ blood. VSS. Pt a/0 4. Pleasant and joking w/ RN. Monitoring devices in place.

## 2020-07-21 NOTE — Progress Notes (Signed)
INR of 7.6 reported to Jennye Moccasin with GI and Cipriano Bunker at [phone].

## 2020-07-21 NOTE — Consult Note (Signed)
[facility] Consult: 11:15 AM 07/21/2020  LOS: 1 day    Referring Provider: Dr Casper Harrison  Primary Care Physician:  Georgianne Fick, MD Primary Gastroenterologist:  Gentry Fitz.      Reason for Consultation:  Anemia.  N/V.  FOBT +.     HPI: Deborah [name] is a 75 y.o. female.  PMH systolic heart failure, EF 20 to 25%.  AICD 2009.  Severe MR, mechanical MVR 2005.  Chronic warfarin. DM 2.  Hypertension.  Anemia chronic disease, baseline Hgb 11.  Extensive colonic diverticulosis observed on CTAP of 08/2010. 11/2014 EGD.  For ABL anemia requiring 3 PRBCs, FOBT +/melena in setting of anticoagulation.  Dr. Dorena Cookey.  Small, nonbleeding gastric and duodenal ulcers with duodenal erosions at the bulb. Clo/Urease positive.  Not clear that H. pylori was ever treated, at discharge in 11/2014 the CLOtest was still pending.  Patient herself does not recall whether she took a course of antibiotics.  In addition to Coumadin, takes low-dose aspirin.  No gastric acid suppressing meds on med list.  Normally moves her bowels 2-3 times a week, brown, formed stools.  Occasional laxatives for constipation, she used OTC laxative on Friday and then developed loose, black diarrheal stools without obvious blood about 4 days PTA.  Then developed 2 days of nonbloody, noncoffee-ground nausea vomiting probably 3-4 episodes in total.  Accompanying abdominal discomfort, not severe pain, in the left abdomen.   Accompanying weakness, dizziness leading to her spending most of her time in bed in the last couple of days.  No shortness of breath, angina, chills, fever, urinary tract symptoms.  Appetite is generally good with the exception of the last few days.  Denies issues with reflux disease.  She reports weight dropping from 175 # to 152 pounds over  the last several months, she weighs herself daily.  Her INR was elevated to 5.5 on 07/08/2020 and her dose of Coumadin was adjusted No nausea or vomiting, BMs since transfer to 5 W.  Hgb: 6.9 >> 3 RBCs  >> 9.6.  Was 10.8 in 09/2017.  MCV 100.  WBCs 10.8. INR 5.6.  Was 5.5 on 07/08/2020. FOBT +.  ED physician described stool as black, liquid at time of DRE. + AKI.  Glucose 271.  Lipase, LFTs normal. Portable CXR: Possible airspace disease at left base, poorly visible diaphragm.  Cardiomegaly.  Fracture through superior sternal wire, stable.  Does not drink alcoholic beverages, never has.  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  Lives alone, takes care of her own home and cooks her own meals.  She has family who lives nearby and keep in touch with her.  Not terribly aware of family history but denies peptic ulcer disease, GI bleed, colorectal or gastric cancers.  Past Medical History:  Diagnosis Date  . Aortic stenosis, mild    mean gradient echo 09/09  . CHF (congestive heart failure) (HCC)    --Non-ischemic CM EF 30-35% by echo 9/09 (Previous 25%)  --s/p  St. Jude single chamber ICD 04/09 --Minimal  CAD by cath 2004 LAD 20% LCX 20% Ramus 30% RCA nl --echo 05/11 EF 20-25%  . Diabetes mellitus    type 2  . Hyperlipidemia   . Hypertension   . Kidney stones   . MR (congenital mitral regurgitation)    Severe MR s/p St. Jude mechanical MVR by Gasper Lloyd 2005    Past Surgical History:  Procedure Laterality Date  . ABDOMINAL HYSTERECTOMY     For menorrhagia and.  Associated severe pain.  Sounds like she had fibroids.  . APPENDECTOMY  1979  . CARDIAC DEFIBRILLATOR PLACEMENT  2009  . CARDIAC VALVE SURGERY  2005  . ESOPHAGOGASTRODUODENOSCOPY N/A 12/05/2014   Procedure: ESOPHAGOGASTRODUODENOSCOPY (EGD);  Surgeon: Dorena Cookey, MD;  Location: [facility]COPY;  Service: Endoscopy;  Laterality: N/A;  . ICD GENERATOR CHANGEOUT N/A  08/01/2016   Procedure: ICD Generator Changeout;  Surgeon: Duke Salvia, MD;  Location: [facility] INVASIVE CV LAB;  Service: Cardiovascular;  Laterality: N/A;  . INSERT / REPLACE / REMOVE PACEMAKER      Prior to Admission medications   Medication Sig Start Date End Date Taking? Authorizing Provider  aspirin EC 81 MG tablet Take 81 mg by mouth daily.   Yes [provider]  carvedilol (COREG) 25 MG tablet Take 25 mg by mouth 2 (two) times daily with a meal.   Yes [provider]  sacubitril-valsartan (ENTRESTO) 97-103 MG Take 1 tablet by mouth 2 (two) times daily. Last refill without office visit. please call [phone] 10/18/19  Yes Bensimhon, Bevelyn Buckles, MD  warfarin (COUMADIN) 5 MG tablet Take 1 to 1.5 tablets daily as directed by Coumadin clinic 05/26/20  Yes Duke Salvia, MD  HYDROcodone-acetaminophen [facility]) 7.5-325 MG tablet Take 1 tablet by mouth 2 (two) times daily as needed for moderate pain.  06/18/16   [provider]  ondansetron (ZOFRAN) 4 MG tablet Take 1 tablet (4 mg total) by mouth every 6 (six) hours as needed for nausea. 12/06/14   Dorothea Ogle, MD  temazepam (RESTORIL) 15 MG capsule Take 15 mg by mouth at bedtime as needed for sleep. 07/11/16   [provider]    Scheduled Meds: . insulin aspart  0-6 Units Subcutaneous Q6H   Infusions: . ceFEPime (MAXIPIME) IV    . lactated ringers    . pantoprozole (PROTONIX) infusion 8 mg/hr (07/21/20 0523)  . vancomycin 750 mg (07/21/20 0325)   PRN Meds: acetaminophen, ondansetron (ZOFRAN) IV   Allergies as of 07/20/2020 - Review Complete 07/20/2020  Allergen Reaction Noted  . Strawberry extract Rash 08/16/2011    Family History  Problem Relation Age of Onset  . Heart disease Mother     Social History   Socioeconomic History  . Marital status: Single    Spouse name: Not on file  . Number of children: Not on file  . Years of education: Not on file  . Highest education level: Not on file   Occupational History  . Occupation: Scientist, research (medical): RETIRED    Comment: disabled  Tobacco Use  . Smoking status: Former Smoker    Quit date: 06/16/1984    Years since quitting: 36.1  . Smokeless tobacco: Never Used  Substance and Sexual Activity  . Alcohol use: No  . Drug use: No  . Sexual activity: Not on file  Other Topics Concern  . Not on file  Social History Narrative   Former smoker, started in early 20's -  less than 1 ppd / Quit in her late 20's   Pt is single   Pt has one child, a son   Pt lives alone   Pt is disabled, previously worked as Lawyer x 12 years   Social Determinants of Corporate investment banker Strain: Not on Ship broker Insecurity: Not on file  Transportation Needs: Not on file  Physical Activity: Not on file  Stress: Not on file  Social Connections: Not on file  Intimate Partner Violence: Not on file    REVIEW OF SYSTEMS: Constitutional: See HPI. ENT:  No nose bleeds Pulm: No shortness of breath.  No cough CV:  No palpitations, no LE edema.  No angina. GU:  No hematuria, no frequency, no oliguria. GI: See HPI. Heme: Denies tendency to excessive bleeding or bruising. Transfusions: Transfused in the last 24 hours Neuro:  No headaches, no peripheral tingling or numbness Derm:  No itching, no rash or sores.  Endocrine:  No sweats or chills.  No polyuria or dysuria Immunization: Has received Pfizer COVID-19 vaccine in February and March 2021.    PHYSICAL EXAM: Vital signs in last 24 hours: Vitals:   07/21/20 0653 07/21/20 0800  BP: 109/68 128/82  Pulse: 78 78  Resp: 18 20  Temp: 98.2 F (36.8 C) 98.5 F (36.9 C)  SpO2: 95% 96%   Wt Readings from Last 3 Encounters:  07/20/20 78 kg  02/01/19 77.6 kg  06/11/18 75.7 kg    General: Pleasant, slightly frail and slightly ill-appearing BF.  Alert and able to provide history. Head: No facial asymmetry or swelling.  No signs of head trauma. Eyes: Conjunctiva pale.  No scleral icterus. Ears:  Not hard of hearing Nose: No congestion or discharge Mouth: Mucosa is moist, pink, clear.  Tongue midline.  Fair dentition with some missing teeth. Neck: No JVD, no masses, no thyromegaly. Lungs: Clear bilaterally with good breath sounds.  Valve click audible on posterior auscultation of the lungs. Heart: Valve click audible.  Monitor with sinus rhythm at 77 and multifocal PVCs. Abdomen: Soft.  Not tender.  Not distended.  Active bowel sounds.  No HSM, masses, bruits, hernias.  Well-healed lower midline surgical scar..   Rectal: Deferred, did not repeat.  Black, diarrheal, FOBT positive stool per ED physician yesterday. Musc/Skeltl: No joint redness, swelling or gross deformity. Extremities: No CCE. Neurologic: Alert.  Oriented x3.  Moves all 4 limbs.  No tremors or gross weakness/deficits. Skin: No rash, no sores, no suspicious lesions. Nodes: No cervical adenopathy. Psych: Cooperative, pleasant, calm.  Intake/Output from previous day: 04/11 0701 - 04/12 0700 In: 11173.6 [I.V.:57.4; Blood:1131.8; IV Piggyback:9984.4] Out: -  Intake/Output this shift: No intake/output data recorded.  LAB RESULTS: Recent Labs    07/20/20 1831 07/21/20 0131 07/21/20 0929  WBC 10.8*  --   --   HGB 6.9* 9.6* 12.3  HCT 22.0* 31.1* 36.5  PLT 177  --   --    BMET Lab Results  Component Value Date   NA 144 07/21/2020   NA 139 07/20/2020   NA 142 02/01/2019   K 4.2 07/21/2020   K 4.2 07/20/2020   K 4.8 02/01/2019   CL 114 (H) 07/21/2020   CL 108 07/20/2020   CL 106 02/01/2019   CO2 23 07/21/2020   CO2 24 07/20/2020   CO2 23 02/01/2019   GLUCOSE 188 (H) 07/21/2020   GLUCOSE 271 (H) 07/20/2020   GLUCOSE 109 (H) 02/01/2019   BUN 64 (H) 07/21/2020  BUN 77 (H) 07/20/2020   BUN 20 02/01/2019   CREATININE 1.60 (H) 07/21/2020   CREATININE 1.88 (H) 07/20/2020   CREATININE 1.16 (H) 02/01/2019   CALCIUM 8.2 (L) 07/21/2020   CALCIUM 8.4 (L) 07/20/2020   CALCIUM 8.5 (L) 02/01/2019    LFT Recent Labs    07/20/20 1831 07/21/20 0929  PROT 5.4* 5.0*  ALBUMIN 3.1* 3.0*  AST 14* 16  ALT 15 13  ALKPHOS 38 35*  BILITOT 0.5 0.7   PT/INR Lab Results  Component Value Date   INR 5.6 (HH) 07/20/2020   INR 5.5 (A) 07/08/2020   INR 2.0 06/08/2020   Hepatitis Panel No results for input(s): HEPBSAG, HCVAB, HEPAIGM, HEPBIGM in the last 72 hours. C-Diff No components found for: CDIFF Lipase     Component Value Date/Time   LIPASE 34 07/20/2020 1831    Drugs of Abuse  No results found for: LABOPIA, COCAINSCRNUR, LABBENZ, AMPHETMU, THCU, LABBARB   RADIOLOGY STUDIES: DG Chest Port 1 View  Result Date: 07/20/2020 CLINICAL DATA:  Possible sepsis EXAM: PORTABLE CHEST 1 VIEW COMPARISON:  04/11/2012 FINDINGS: Post sternotomy changes. Fracture through superior sternal wire as before. Left-sided single lead pacing device over the right ventricle. Cardiomegaly. Possible airspace disease at left base with poorly visible diaphragm. The right lung is clear. No pneumothorax. IMPRESSION: 1. Possible airspace disease at the left base with poorly visible diaphragm. 2. Cardiomegaly. Electronically Signed   By: Jasmine Pang M.D.   On: 07/20/2020 19:57     IMPRESSION:   *    Nonbloody nausea and vomiting but black, liquid, FOBT+ stool on DRE. Suspect recurrent ulcer disease. 11/2014 upper GI bleed.  EGD at that time revealed nonbleeding gastric and duodenal ulcers.  H. pylori positive then, ?was this ever treated?  Not on any gastric acid suppressing medications currently.  Protonix drip initiated last evening.  *    Meets sepsis criteria w lactic acidosis, now resolved.  Vancomycin, cefepime in place.  *   Iatrogenic coagulopathy.  On chronic Coumadin for mechanical MVR.  *    Blood loss anemia.  Perhaps anemia of chronic disease as well. Hgb improved after 3 PRBCs. Iron, TIBC, ferritin, B12, folate WNL.  Elevated iron sats.    *    AKI.  Hx stage 3a CKD.  Current GFR consistent  with stage 4 dz.      PLAN:     *   EGD once coags normalized.  Ideally INR 1.5 or less. ? Give low dose oral Vit K?  *   If no more recurrent bleeding within 24 hours, and Hgb stable, would switch to scheduled BID Protonix  *    allow, ordered clear liquids.  *   ? check serum H. pylori antibody now vs wait and repeat CLOtest at time of the EGD?   Jennye Moccasin  07/21/2020, 11:15 AM Phone [phone]

## 2020-07-21 NOTE — ED Notes (Signed)
Report given to Vicky, RN.

## 2020-07-21 NOTE — Progress Notes (Signed)
Called and updated pharmacy on critical INR level as they called several hours ago inquiring about it and wanted to be updated.

## 2020-07-21 NOTE — Progress Notes (Signed)
PROGRESS NOTE    Deborah [name]  IFO:[id number] DOB: July 25, 1945 DOA: 07/20/2020 PCP: Georgianne Fick, MD   Brief Narrative: This 75 year old female with PMH significant for chronic systolic heart failure s/p AICD in 2009 with most recent EF 20 to 25% per echocardiogram in March 2021, severe mitral regurgitation s/p mechanical mitral valve replacement in 2005, on chronic anticoagulation with coumadin and aspirin, type 2 diabetes, hypertension, anemia of chronic disease with baseline hemoglobin of 11 presented in the ED with acute  upper GI bleeding. In the ED,  Patient has hemoglobin 6.9 requiring blood transfusion.  INR 5.5.  Stool for occult blood + patient was started on Protonix drip, has received 2 units of packed red blood cells, GI was consulted.  GI is considering vitamin K for high INR.  EGD cannot be completed until INR is below 1.5.  Cardiology was consulted given mechanical mitral valve.  Cardiology recommended holding on vitamin K for now.  Patient was also given cefepime and vancomycin for suspected sepsis.  Antibiotic discontinued given high INR.  Sepsis ruled out.  Assessment & Plan:   Principal Problem:   Acute upper GI bleed Active Problems:   Mitral valve disorder. (Status post mechanical valve replacement)   SYSTOLIC HEART FAILURE, CHRONIC   Long term (current) use of anticoagulants   Acute on chronic anemia   AKI (acute kidney injury) (HCC)   Lactic acidosis  Acute upper GI bleed: Patient is on chronic anticoagulation with warfarin in the setting of mechanical mitral valve replacement, patient also on baby aspirin.  Patient does reports history of upper GI bleed in August 2016 with prior EGD revealed duodenitis and potential duodenal ulcer. Patient found to have hemoglobin 6.9.  S/p 2 PRBC, hemoglobin 11.1 Continue Protonix drip.  GI consulted.  Plan for EGD. EGD cannot be completed until INR  below 1.5. GI considers oral vitamin K. Given history of mechanical  mitral valve replacement , cardiology consulted , Cardiology recommended to hold on vitamin K unless actively bleeding..  Lactic acidosis: Patient was found to have lactic acid of 2.4 on admission otherwise no signs of sepsis noted. There was questionable opacity on chest x-ray concerning for pneumonia.  Patient was given antibiotics. Lactic acid resolved with IV fluids.  Antibiotic discontinued.  AKI: Patient presented with serum creatinine 1.8.   Baseline creatinine 1.16.  This could be due to above. Avoid nephrotoxic medications.  Recheck labs in the morning.  Chronic systolic CHF:  s/p AICD in 2009 with most recent EF 20 to 25% per echocardiogram in March 2021, hold [employer]rest[employer] and [employer]. Cardiology consulted recommended to resume Coreg tomorrow morning.  Severe mitral regurgitation:  s/p mechanical mitral valve replacement in 2005, on chronic anticoagulation with coumadin and aspirin. Aspirin and Coumadin is on hold.  INR continues to trend up. It seems like patient has received antibiotic which caused her INR to go up. Cardiology consulted recommended to hold on oral vitamin K unless it is necessary.  Wait for INR to go down naturally.   DVT prophylaxis:  SCDS Code Status: Full code. Family Communication:No family at bedside. Disposition Plan:   Status is: Inpatient  Remains inpatient appropriate because:Inpatient level of care appropriate due to severity of illness   Dispo: The patient is from: Home              Anticipated d/c is to: Home              Patient currently is not medically stable to d/c.  Difficult to place patient No   Consultants:   Cardiology  Gastroenterology  Procedures: Planned EGD once INR is below 1.5 Antimicrobials: Anti-infectives (From admission, onward)   Start     Dose/Rate Route Frequency Ordered Stop   07/21/20 2000  ceFEPIme (MAXIPIME) 2 g in sodium chloride 0.9 % 100 mL IVPB  Status:  Discontinued        2 g 200 mL/hr over 30  Minutes Intravenous Every 24 hours 07/20/20 2037 07/20/20 2156   07/21/20 2000  ceFEPIme (MAXIPIME) 2 g in sodium chloride 0.9 % 100 mL IVPB  Status:  Discontinued        2 g 200 mL/hr over 30 Minutes Intravenous Every 24 hours 07/21/20 0234 07/21/20 1604   07/21/20 0330  vancomycin (VANCOREADY) IVPB 750 mg/150 mL  Status:  Discontinued        750 mg 150 mL/hr over 60 Minutes Intravenous Every 48 hours 07/21/20 0234 07/21/20 1604   07/20/20 1915  ceFEPIme (MAXIPIME) 2 g in sodium chloride 0.9 % 100 mL IVPB        2 g 200 mL/hr over 30 Minutes Intravenous  Once 07/20/20 1905 07/20/20 2029   07/20/20 1915  metroNIDAZOLE (FLAGYL) IVPB 500 mg        500 mg 100 mL/hr over 60 Minutes Intravenous  Once 07/20/20 1905 07/20/20 2137      Subjective: Patient was seen and examined at bedside.  Overnight events noted.  Patient reports feeling better,  denies any pain,  remained n.p.o. asking for food. She is going to have EGD pending INR normalization.  Objective: Vitals:   07/21/20 0510 07/21/20 0653 07/21/20 0800 07/21/20 1225  BP: (!) 116/48 109/68 128/82 (!) 105/57  Pulse: 77 78 78 64  Resp: 19 18 20 17   Temp: 98.9 F (37.2 C) 98.2 F (36.8 C) 98.5 F (36.9 C) 99.1 F (37.3 C)  TempSrc: Axillary Axillary Oral Oral  SpO2: 96% 95% 96% 92%  Weight:      Height:        Intake/Output Summary (Last 24 hours) at 07/21/2020 1618 Last data filed at 07/21/2020 1300 Gross per 24 hour  Intake 11413.61 ml  Output 750 ml  Net 10663.61 ml   Filed Weights   07/20/20 1830  Weight: 78 kg    Examination:  General exam: Appears calm and comfortable, not in any acute distress. Respiratory system: Clear to auscultation. Respiratory effort normal. Cardiovascular system: S1 & S2 heard, RRR. No JVD, murmurs, rubs, gallops or clicks. No pedal edema. Gastrointestinal system: Abdomen is nondistended, soft and nontender. No organomegaly or masses felt. Normal bowel sounds heard. Central nervous system:  Alert and oriented. No focal neurological deficits. Extremities: Symmetric 5 x 5 power.  No edema, no cyanosis, no clubbing. Skin: No rashes, lesions or ulcers Psychiatry: Judgement and insight appear normal. Mood & affect appropriate.     Data Reviewed: I have personally reviewed following labs and imaging studies  CBC: Recent Labs  Lab 07/20/20 1831 07/21/20 0131 07/21/20 0929 07/21/20 1134  WBC 10.8*  --  11.7* 10.9*  NEUTROABS 9.2*  --  9.3*  --   HGB 6.9* 9.6* 12.2  12.3 11.1*  HCT 22.0* 31.1* 36.3  36.5 33.3*  MCV 100.9*  --  92.1 92.0  PLT 177  --  107* 105*   Basic Metabolic Panel: Recent Labs  Lab 07/20/20 1831 07/21/20 0929  NA 139 144  K 4.2 4.2  CL 108 114*  CO2 24 23  GLUCOSE 271* 188*  BUN 77* 64*  CREATININE 1.88* 1.60*  CALCIUM 8.4* 8.2*  MG  --  1.7  1.8   GFR: Estimated Creatinine Clearance: 29.6 mL/min (A) (by C-G formula based on SCr of 1.6 mg/dL (H)). Liver Function Tests: Recent Labs  Lab 07/20/20 1831 07/21/20 0929  AST 14* 16  ALT 15 13  ALKPHOS 38 35*  BILITOT 0.5 0.7  PROT 5.4* 5.0*  ALBUMIN 3.1* 3.0*   Recent Labs  Lab 07/20/20 1831  LIPASE 34   No results for input(s): AMMONIA in the last 168 hours. Coagulation Profile: Recent Labs  Lab 07/20/20 1858 07/21/20 0929 07/21/20 1452  INR 5.6* 5.8* 7.6*   Cardiac Enzymes: No results for input(s): CKTOTAL, CKMB, CKMBINDEX, TROPONINI in the last 168 hours. BNP (last 3 results) No results for input(s): PROBNP in the last 8760 hours. HbA1C: No results for input(s): HGBA1C in the last 72 hours. CBG: Recent Labs  Lab 07/21/20 0520 07/21/20 0828 07/21/20 1229  GLUCAP 142* 151* 155*   Lipid Profile: No results for input(s): CHOL, HDL, LDLCALC, TRIG, CHOLHDL, LDLDIRECT in the last 72 hours. Thyroid Function Tests: Recent Labs    07/21/20 0929  TSH 0.272*   Anemia Panel: Recent Labs    07/20/20 1952  VITAMINB12 310  FOLATE 14.5  FERRITIN 20  TIBC 322  IRON  127  RETICCTPCT 3.4*   Sepsis Labs: Recent Labs  Lab 07/20/20 1905 07/21/20 0107 07/21/20 0929  PROCALCITON  --   --  <0.10  LATICACIDVEN 2.4* 1.8  --     Recent Results (from the past 240 hour(s))  Blood Culture (routine x 2)     Status: None (Preliminary result)   Collection Time: 07/20/20  7:33 PM   Specimen: BLOOD  Result Value Ref Range Status   Specimen Description BLOOD SITE NOT SPECIFIED  Final   Special Requests   Final    BOTTLES DRAWN AEROBIC AND ANAEROBIC Blood Culture results may not be optimal due to an inadequate volume of blood received in culture bottles   Culture   Final    NO GROWTH < 12 HOURS Performed at [facility], [address], [place], [place] [address]    Report Status PENDING  Incomplete  Resp Panel by RT-PCR (Flu A&B, Covid) Nasopharyngeal Swab     Status: None   Collection Time: 07/20/20  7:46 PM   Specimen: Nasopharyngeal Swab; Nasopharyngeal(NP) swabs in vial transport medium  Result Value Ref Range Status   SARS Coronavirus 2 by RT PCR NEGATIVE NEGATIVE Final    Comment: (NOTE) SARS-CoV-2 target nucleic acids are NOT DETECTED.  The SARS-CoV-2 RNA is generally detectable in upper respiratory specimens during the acute phase of infection. The lowest concentration of SARS-CoV-2 viral copies this assay can detect is 138 copies/mL. A negative result does not preclude SARS-Cov-2 infection and should not be used as the sole basis for treatment or other patient management decisions. A negative result may occur with  improper specimen collection/handling, submission of specimen other than nasopharyngeal swab, presence of viral mutation(s) within the areas targeted by this assay, and inadequate number of viral copies(<138 copies/mL). A negative result must be combined with clinical observations, patient history, and epidemiological information. The expected result is Negative.  Fact Sheet for Patients:   BloggerCourse.com  Fact Sheet for Healthcare Providers:  SeriousBroker.it  This test is no t yet approved or cleared by the [place] FDA and  has been authorized for detection and/or diagnosis  of SARS-CoV-2 by FDA under an Emergency Use Authorization (EUA). This EUA will remain  in effect (meaning this test can be used) for the duration of the COVID-19 declaration under Section 564(b)(1) of the Act, 21 U.S.C.section 360bbb-3(b)(1), unless the authorization is terminated  or revoked sooner.       Influenza A by PCR NEGATIVE NEGATIVE Final   Influenza B by PCR NEGATIVE NEGATIVE Final    Comment: (NOTE) The Xpert Xpress SARS-CoV-2/FLU/RSV plus assay is intended as an aid in the diagnosis of influenza from Nasopharyngeal swab specimens and should not be used as a sole basis for treatment. Nasal washings and aspirates are unacceptable for Xpert Xpress SARS-CoV-2/FLU/RSV testing.  Fact Sheet for Patients: BloggerCourse.com  Fact Sheet for Healthcare Providers: SeriousBroker.it  This test is not yet approved or cleared by the [place] FDA and has been authorized for detection and/or diagnosis of SARS-CoV-2 by FDA under an Emergency Use Authorization (EUA). This EUA will remain in effect (meaning this test can be used) for the duration of the COVID-19 declaration under Section 564(b)(1) of the Act, 21 U.S.C. section 360bbb-3(b)(1), unless the authorization is terminated or revoked.  Performed at [facility], [address] [place], [place] [address]   Blood Culture (routine x 2)     Status: None (Preliminary result)   Collection Time: 07/20/20  7:47 PM   Specimen: BLOOD  Result Value Ref Range Status   Specimen Description BLOOD SITE NOT SPECIFIED  Final   Special Requests   Final    BOTTLES DRAWN AEROBIC AND ANAEROBIC Blood Culture results may not be optimal  due to an inadequate volume of blood received in culture bottles   Culture   Final    NO GROWTH < 12 HOURS Performed at [facility], [address] [place], [place] [address]    Report Status PENDING  Incomplete     Radiology Studies: DG Chest Port 1 View  Result Date: 07/20/2020 CLINICAL DATA:  Possible sepsis EXAM: PORTABLE CHEST 1 VIEW COMPARISON:  04/11/2012 FINDINGS: Post sternotomy changes. Fracture through superior sternal wire as before. Left-sided single lead pacing device over the right ventricle. Cardiomegaly. Possible airspace disease at left base with poorly visible diaphragm. The right lung is clear. No pneumothorax. IMPRESSION: 1. Possible airspace disease at the left base with poorly visible diaphragm. 2. Cardiomegaly. Electronically Signed   By: Jasmine Pang M.D.   On: 07/20/2020 19:57    Scheduled Meds: . insulin aspart  0-6 Units Subcutaneous Q6H   Continuous Infusions: . lactated ringers    . pantoprozole (PROTONIX) infusion 8 mg/hr (07/21/20 1415)     LOS: 1 day    Time spent: 35 mins   Manav Pierotti, MD [facility]   If 7PM-7AM, please contact night-coverage

## 2020-07-21 NOTE — Consult Note (Addendum)
Cardiology Consultation:   Patient ID: NAKIA REMMERS MRN: [id number]; DOB: [birth year]  Admit date: 07/20/2020 Date of Consult: 07/21/2020  PCP:  Georgianne Fick, MD    [facility]  Cardiologist:  Arvilla Meres, MD 06/11/2018 Advanced Practice Provider:  No care team member to display Electrophysiologist:  Sherryl Manges, MD 01/23/2018  Patient Profile:   Deborah [name] is a 75 y.o. female with a hx of NICM w/ EF 25%, STJ singe chamber ICD, STJ MVR 2005 on coumadin, DM, HTN, HLD, nephrolithiasis, S-CHF, mild AS, ABL anemia requiring 3 PRBCs 2016 w/ gastric and duodenal ulcers on EGD (INR 4.5), who is being seen today for the management of anticoagulation at the request of Dr Lucianne Muss.  History of Present Illness:   Deborah [name] had an INR of 2.0 on 02/28, was on 1-1/2  5 mg tabs daily. Dose increased to 2 tabs on Monday, o/w 1-12/ tabs. INR 5.5 on 03/30, coum held x 2 days, resume 1-1/2 tabs daily.  On 04/11, Deborah [name] came to the ER for N&V, diarrhea for several days. Not aware of any blood loss, but Hgb 6.9 and INR 5.6. lactate 2.4  Pt felt to have intra-abdominal infection, started on IV ABX, given IVF 2 L for hypotension, started on Protonix drip. Transfused.   Pt seen by GI, noted to have black diarrhea and some RLQ abd pain. Pt has lost 20 lbs in the last few months.   Feels better since being in the hospital. GI was considering Vit K, cards asked to see.  Deborah [name] has not had chest pain. Not clearly aware of blood loss. Compliant w/ meds and INR checks. No tobacco, ETOH or drug use.    Past Medical History:  Diagnosis Date  . Aortic stenosis, mild    mean gradient echo 09/09  . CHF (congestive heart failure) (HCC)    --Non-ischemic CM EF 30-35% by echo 9/09 (Previous 25%)  --s/p St. Jude single chamber ICD 04/09 --Minimal  CAD by cath 2004 LAD 20% LCX 20% Ramus 30% RCA nl --echo 05/11 EF 20-25%  . Diabetes mellitus    type 2  . Hyperlipidemia    . Hypertension   . Kidney stones   . MR (congenital mitral regurgitation)    Severe MR s/p St. Jude mechanical MVR by Gasper Lloyd 2005    Past Surgical History:  Procedure Laterality Date  . ABDOMINAL HYSTERECTOMY     For menorrhagia and.  Associated severe pain.  Sounds like she had fibroids.  . APPENDECTOMY  1979  . CARDIAC DEFIBRILLATOR PLACEMENT  2009  . CARDIAC VALVE SURGERY  2005  . ESOPHAGOGASTRODUODENOSCOPY N/A 12/05/2014   Procedure: ESOPHAGOGASTRODUODENOSCOPY (EGD);  Surgeon: Dorena Cookey, MD;  Location: [facility] ENDOSCOPY;  Service: Endoscopy;  Laterality: N/A;  . ICD GENERATOR CHANGEOUT N/A 08/01/2016   Procedure: ICD Generator Changeout;  Surgeon: Duke Salvia, MD;  Location: [facility] INVASIVE CV LAB;  Service: Cardiovascular;  Laterality: N/A;  . INSERT / REPLACE / REMOVE PACEMAKER       Home Medications:  Prior to Admission medications   Medication Sig Start Date End Date Taking? Authorizing Provider  aspirin EC 81 MG tablet Take 81 mg by mouth daily.   Yes [provider]  carvedilol (COREG) 25 MG tablet Take 25 mg by mouth 2 (two) times daily with a meal.   Yes [provider]  sacubitril-valsartan (ENTRESTO) 97-103 MG Take 1 tablet by mouth 2 (two) times daily. Last refill without  office visit. please call [phone] 10/18/19  Yes Bensimhon, Bevelyn Buckles, MD  warfarin (COUMADIN) 5 MG tablet Take 1 to 1.5 tablets daily as directed by Coumadin clinic 05/26/20  Yes Duke Salvia, MD  HYDROcodone-acetaminophen [facility]) 7.5-325 MG tablet Take 1 tablet by mouth 2 (two) times daily as needed for moderate pain.  06/18/16   [provider]  ondansetron (ZOFRAN) 4 MG tablet Take 1 tablet (4 mg total) by mouth every 6 (six) hours as needed for nausea. 12/06/14   Dorothea Ogle, MD  temazepam (RESTORIL) 15 MG capsule Take 15 mg by mouth at bedtime as needed for sleep. 07/11/16   [provider]    Inpatient Medications: Scheduled Meds: . insulin aspart  0-6  Units Subcutaneous Q6H   Continuous Infusions: . ceFEPime (MAXIPIME) IV    . lactated ringers    . pantoprozole (PROTONIX) infusion 8 mg/hr (07/21/20 1415)  . vancomycin 750 mg (07/21/20 0325)   PRN Meds: acetaminophen, ondansetron (ZOFRAN) IV  Allergies:    Allergies  Allergen Reactions  . Strawberry Extract Rash    Rash & itching.    Social History:   Social History   Socioeconomic History  . Marital status: Single    Spouse name: Not on file  . Number of children: Not on file  . Years of education: Not on file  . Highest education level: Not on file  Occupational History  . Occupation: Scientist, research (medical): RETIRED    Comment: disabled  Tobacco Use  . Smoking status: Former Smoker    Quit date: 06/16/1984    Years since quitting: 36.1  . Smokeless tobacco: Never Used  Substance and Sexual Activity  . Alcohol use: No  . Drug use: No  . Sexual activity: Not on file  Other Topics Concern  . Not on file  Social History Narrative   Former smoker, started in early 20's - less than 1 ppd / Quit in her late 20's   Pt is single   Pt has one child, a son   Pt lives alone   Pt is disabled, previously worked as Lawyer x 12 years   Social Determinants of Corporate investment banker Strain: Not on [employer] Insecurity: Not on file  Transportation Needs: Not on file  Physical Activity: Not on file  Stress: Not on file  Social Connections: Not on file  Intimate Partner Violence: Not on file    Family History:   Family History  Problem Relation Age of Onset  . Heart disease Mother     Family Status  Relation Name Status  . Mother  Deceased  . Father  Deceased     ROS:  Please see the history of present illness.  All other ROS reviewed and negative.     Physical Exam/Data:   Vitals:   07/21/20 0510 07/21/20 0653 07/21/20 0800 07/21/20 1225  BP: (!) 116/48 109/68 128/82 (!) 105/57  Pulse: 77 78 78 64  Resp: 19 18 20 17   Temp: 98.9 F (37.2 C) 98.2 F (36.8  C) 98.5 F (36.9 C) 99.1 F (37.3 C)  TempSrc: Axillary Axillary Oral Oral  SpO2: 96% 95% 96% 92%  Weight:      Height:        Intake/Output Summary (Last 24 hours) at 07/21/2020 1511 Last data filed at 07/21/2020 1300 Gross per 24 hour  Intake 11413.61 ml  Output 750 ml  Net 10663.61 ml   Last 3 Weights  07/20/2020 02/01/2019 06/11/2018  Weight (lbs) 171 lb 15.3 oz 171 lb 166 lb 12.8 oz  Weight (kg) 78 kg 77.565 kg 75.66 kg     Body mass index is 30.46 kg/m.  General:  Well nourished, well developed, in no acute distress HEENT: normal Lymph: no adenopathy Neck: no JVD Endocrine:  No thryomegaly Vascular: No carotid bruits; 4/4 extremity pulses 2+   Cardiac:  normal S1, S2; RRR; soft murmur, +valve click Lungs:  clear to auscultation bilaterally, no wheezing, rhonchi or rales  Abd: soft, slight RUQ tenderness, no hepatomegaly  Ext: no edema Musculoskeletal:  No deformities, BUE and BLE strength normal and equal Skin: warm and dry  Neuro:  CNs 2-12 intact, no focal abnormalities noted Psych:  Normal affect   EKG:  The EKG was personally reviewed and demonstrates:  04/11 ECG is SR w/ PVCs, HR 94, notched QRS w/ duration 128 Deborah, ?inc LBBB, no acute ischemic changes, no morphology change since 01/2019 Telemetry:  Telemetry was personally reviewed and demonstrates:  SR  Relevant CV Studies:  ECHO:  06/18/2018 1. The left ventricle has severely reduced systolic function, with an  ejection fraction of 20-25%. The cavity size was mildly dilated. Left  ventricular diastolic function could not be evaluated due to mitral valve  replacement/repair. Left ventricular  diffuse hypokinesis.  2. The right ventricle has normal systolic function. The cavity was  normal. There is no increase in right ventricular wall thickness.  3. The tricuspid valve is grossly normal.  4. The aortic valve is tricuspid Mild thickening of the aortic valve.  5. Severe global reduction in LV systolic  function; mild LVE; s/p MVR  with mild MR.   Laboratory Data:  High Sensitivity Troponin:  No results for input(s): TROPONINIHS in the last 720 hours.   Chemistry Recent Labs  Lab 07/20/20 1831 07/21/20 0929  NA 139 144  K 4.2 4.2  CL 108 114*  CO2 24 23  GLUCOSE 271* 188*  BUN 77* 64*  CREATININE 1.88* 1.60*  CALCIUM 8.4* 8.2*  GFRNONAA 27* 33*  ANIONGAP 7 7    Recent Labs  Lab 07/20/20 1831 07/21/20 0929  PROT 5.4* 5.0*  ALBUMIN 3.1* 3.0*  AST 14* 16  ALT 15 13  ALKPHOS 38 35*  BILITOT 0.5 0.7   Hematology Recent Labs  Lab 07/20/20 1831 07/20/20 1952 07/21/20 0131 07/21/20 0929 07/21/20 1134  WBC 10.8*  --   --  11.7* 10.9*  RBC 2.18* 2.03*  --  3.94 3.62*  HGB 6.9*  --  9.6* 12.2  12.3 11.1*  HCT 22.0*  --  31.1* 36.3  36.5 33.3*  MCV 100.9*  --   --  92.1 92.0  MCH 31.7  --   --  31.0 30.7  MCHC 31.4  --   --  33.6 33.3  RDW 12.8  --   --  16.1* 16.1*  PLT 177  --   --  107* 105*   BNPNo results for input(s): BNP, PROBNP in the last 168 hours.  DDimer No results for input(s): DDIMER in the last 168 hours.  Lab Results  Component Value Date   INR 5.8 (HH) 07/21/2020   INR 5.6 (HH) 07/20/2020   INR 5.5 (A) 07/08/2020     Radiology/Studies:  DG Chest Port 1 View  Result Date: 07/20/2020 CLINICAL DATA:  Possible sepsis EXAM: PORTABLE CHEST 1 VIEW COMPARISON:  04/11/2012 FINDINGS: Post sternotomy changes. Fracture through superior sternal wire as before. Left-sided single lead  pacing device over the right ventricle. Cardiomegaly. Possible airspace disease at left base with poorly visible diaphragm. The right lung is clear. No pneumothorax. IMPRESSION: 1. Possible airspace disease at the left base with poorly visible diaphragm. 2. Cardiomegaly. Electronically Signed   By: Jasmine Pang M.D.   On: 07/20/2020 19:57     Assessment and Plan:   1. Anticoagulation -  On coumadin for mech MV - INR supratherapeutic (as it was when she last had UGIB in  2016) - do not feel Vit K helpful as it would require prolonged heparin >> coumadin after eval completed - rec allowing INR to drift down, GI to proceed w/ eval once INR acceptable - start heparin when INR < 2.5   2.  Chronic systolic CHF - EF 20-25% 2020, recheck echo - continue Coreg 25 mg bid and Entresto 97/103 mg bid when BP will allow, both on hold right now - discuss w/ MD restarting Coreg at 3.125 mg bid tomorrow since SBP is improving - s/p STJ ICD, VVI at 40 bpm, no interrogation since 10/2019 >> will get - follow wt and volume status closely  3. UGIB - SBP 70s on admit w/ Hgb 6.9 and INR 1.88 - got IVF and blood, sx improved - GI eval pending - pt reports wt loss, but her weight has been between 166-174 lbs for 4 years  Otherwise, per IM Principal Problem:   Acute upper GI bleed Active Problems:   Mitral valve disorder. (Status post mechanical valve replacement)   SYSTOLIC HEART FAILURE, CHRONIC   Long term (current) use of anticoagulants   Acute on chronic anemia   AKI (acute kidney injury) (HCC)   Lactic acidosis    Risk Assessment/Risk Scores:                For questions or updates, please contact [facility] Please consult [email or web address] for contact info under    Signed, Theodore Demark, PA-C  07/21/2020 3:11 PM  Personally seen and examined. Agree with above.  75 year old with mechanical mitral valve here with GI bleed, supratherapeutic INR, nonischemic cardiomyopathy, ICD.  In 2016 required 3 units of blood secondary to duodenal ulcers with INR 4.5 at that time.  Yesterday she did have some right lower quadrant abdominal discomfort, currently feeling well.  Black diarrhea was noted previously.  She is lost 20 pounds in the last few months.  We are being consulted to discuss anticoagulation status and potential reversal.  She follows closely at our Coumadin clinic.  She has not seen Dr. Gala Romney in about 2 years.  GEN: Pleasant,  quiet HEENT: normal  Neck: no JVD, carotid bruits, or masses Cardiac: RRR; S1 click, no murmurs, rubs, or gallops,no edema, ICD Respiratory:  clear to auscultation bilaterally, normal work of breathing GI: soft, nontender, nondistended, + BS Deborah: no deformity or atrophy  Skin: warm and dry, no rash Neuro:  Alert and Oriented x 3, Strength and sensation are intact Psych: euthymic mood, full affect  INR on arrival 5.6.  Increased to 7.4 after IV antibiotics.  Currently no active signs of bleeding.  Assessment and plan:  Chronic anticoagulation management in the setting of GI bleed with mechanical mitral valve with underlying nonischemic cardiomyopathy EF 25% -Recommendation is continue to hold Coumadin.  Allow INR to drift down naturally as long as there is no distress at hand, worsening bleeding or emergent bleed. -We will try to avoid vitamin K as this may exacerbate potential thrombotic complication of her mitral valve  which could be devastating.  Remember, she also has an extremely low flow state given her EF of 25% and dilated left atrium. -Relayed to pharmacy team, primary team.  Chronic systolic heart failure nonischemic cardiomyopathy EF 20 to 25% -On goal-directed therapy at home currently.  Both are on hold given bleed.  I agree that we should likely restart carvedilol at low-dose tomorrow as long as her blood pressure is improved.  ICD -Saint Jude ICD set VVI at 40 bpm.  She is not had an interrogation since July 2021.  We will ask to have this done while she is here.  We will follow with you.  Donato Schultz, MD

## 2020-07-21 NOTE — Progress Notes (Signed)
Pharmacy Antibiotic Note  Deborah [name] is a 75 y.o. female admitted on 07/20/2020 with pneumonia.  Pharmacy has been consulted for Vancomycin dosing. Already on Cefepime for intra-abdominal infection but this is thought to be more likely GIB. Planning to continue Vancomycin/Cefepime for ?PNA and DC Vancomycin if MRSA PCR is negative. Noted renal dysfunction.   Plan: Vancomycin 750 mg IV q48h >>Estimated AUC: 440 Already on Cefepime Trend WBC, temp, renal function  F/U infectious work-up Drug levels as indicated   Height: 5\' 3"  (160 cm) Weight: 78 kg (171 lb 15.3 oz) IBW/kg (Calculated) : 52.4  Temp (24hrs), Avg:99.1 F (37.3 C), Min:98.3 F (36.8 C), Max:100 F (37.8 C)  Recent Labs  Lab 07/20/20 1831 07/20/20 1905 07/21/20 0107  WBC 10.8*  --   --   CREATININE 1.88*  --   --   LATICACIDVEN  --  2.4* 1.8    Estimated Creatinine Clearance: 25.2 mL/min (A) (by C-G formula based on SCr of 1.88 mg/dL (H)).    Allergies  Allergen Reactions  . Strawberry Extract Rash    Rash & itching.    09/20/20, PharmD, BCPS Clinical Pharmacist Phone: [phone]

## 2020-07-21 NOTE — Sepsis Progress Note (Signed)
Sepsis timed out. 2nd LA still has not resulted.

## 2020-07-22 ENCOUNTER — Inpatient Hospital Stay (HOSPITAL_COMMUNITY): Payer: Medicare HMO

## 2020-07-22 DIAGNOSIS — T45515A Adverse effect of anticoagulants, initial encounter: Secondary | ICD-10-CM

## 2020-07-22 DIAGNOSIS — I5021 Acute systolic (congestive) heart failure: Secondary | ICD-10-CM | POA: Diagnosis not present

## 2020-07-22 DIAGNOSIS — K922 Gastrointestinal hemorrhage, unspecified: Secondary | ICD-10-CM | POA: Diagnosis not present

## 2020-07-22 DIAGNOSIS — D6832 Hemorrhagic disorder due to extrinsic circulating anticoagulants: Secondary | ICD-10-CM

## 2020-07-22 LAB — BASIC METABOLIC PANEL
Anion gap: 4 — ABNORMAL LOW (ref 5–15)
BUN: 52 mg/dL — ABNORMAL HIGH (ref 8–23)
CO2: 28 mmol/L (ref 22–32)
Calcium: 8.4 mg/dL — ABNORMAL LOW (ref 8.9–10.3)
Chloride: 112 mmol/L — ABNORMAL HIGH (ref 98–111)
Creatinine, Ser: 1.29 mg/dL — ABNORMAL HIGH (ref 0.44–1.00)
GFR, Estimated: 43 mL/min — ABNORMAL LOW (ref >60)
Glucose, Bld: 112 mg/dL — ABNORMAL HIGH (ref 70–99)
Potassium: 4.3 mmol/L (ref 3.5–5.1)
Sodium: 144 mmol/L (ref 135–145)

## 2020-07-22 LAB — BPAM RBC
Blood Product Expiration Date: 202205112359
Blood Product Expiration Date: 202205122359
Blood Product Expiration Date: 202205122359
Blood Product Expiration Date: 202205122359
ISSUE DATE / TIME: 202204112134
ISSUE DATE / TIME: 202204112339
ISSUE DATE / TIME: 202204120058
ISSUE DATE / TIME: 202204120452
Unit Type and Rh: 5100
Unit Type and Rh: 5100
Unit Type and Rh: 5100
Unit Type and Rh: 5100

## 2020-07-22 LAB — TYPE AND SCREEN
ABO/RH(D): O POS
Antibody Screen: NEGATIVE
Unit division: 0
Unit division: 0
Unit division: 0
Unit division: 0

## 2020-07-22 LAB — CBC
HCT: 32.9 % — ABNORMAL LOW (ref 36.0–46.0)
Hemoglobin: 10.8 g/dL — ABNORMAL LOW (ref 12.0–15.0)
MCH: 30.8 pg (ref 26.0–34.0)
MCHC: 32.8 g/dL (ref 30.0–36.0)
MCV: 93.7 fL (ref 80.0–100.0)
Platelets: 105 10*3/uL — ABNORMAL LOW (ref 150–400)
RBC: 3.51 MIL/uL — ABNORMAL LOW (ref 3.87–5.11)
RDW: 16.4 % — ABNORMAL HIGH (ref 11.5–15.5)
WBC: 8.9 10*3/uL (ref 4.0–10.5)
nRBC: 0.2 % (ref 0.0–0.2)

## 2020-07-22 LAB — ECHOCARDIOGRAM COMPLETE
Area-P 1/2: 2.11 cm2
Calc EF: 40.7 %
Height: 63 in
MV M vel: 5.26 m/s
MV Peak grad: 110.7 mmHg
MV VTI: 1.26 cm2
S' Lateral: 4.6 cm
Single Plane A2C EF: 45.9 %
Single Plane A4C EF: 32.8 %
Weight: 2751.34 oz

## 2020-07-22 LAB — LEGIONELLA PNEUMOPHILA SEROGP 1 UR AG: L. pneumophila Serogp 1 Ur Ag: NEGATIVE

## 2020-07-22 LAB — GLUCOSE, CAPILLARY
Glucose-Capillary: 110 mg/dL — ABNORMAL HIGH (ref 70–99)
Glucose-Capillary: 152 mg/dL — ABNORMAL HIGH (ref 70–99)
Glucose-Capillary: 156 mg/dL — ABNORMAL HIGH (ref 70–99)
Glucose-Capillary: 138 mg/dL — ABNORMAL HIGH (ref 70–99)

## 2020-07-22 LAB — MAGNESIUM: Magnesium: 1.7 mg/dL (ref 1.7–2.4)

## 2020-07-22 LAB — PHOSPHORUS: Phosphorus: 2.6 mg/dL (ref 2.5–4.6)

## 2020-07-22 LAB — PROTIME-INR
INR: 6.9 (ref 0.8–1.2)
Prothrombin Time: 59.9 seconds — ABNORMAL HIGH (ref 11.4–15.2)

## 2020-07-22 MED ORDER — PANTOPRAZOLE SODIUM 40 MG PO TBEC
40.0000 mg | DELAYED_RELEASE_TABLET | Freq: Two times a day (BID) | ORAL | Status: DC
  Administered 2020-07-22 – 2020-07-31 (×18): 40 mg via ORAL
  Filled 2020-07-22 (×18): qty 1

## 2020-07-22 NOTE — Progress Notes (Addendum)
PROGRESS NOTE        PATIENT DETAILS Name: Deborah ReichertJanice F [name] Age: 75 y.o. Sex: female Date of Birth: 03/26/1946 Admit Date: 07/20/2020 Admitting Physician Angie FavaJustin B Howerter, DO ZOX:[id number]:Ramachandran, Campbell LernerAjith, MD  Brief Narrative: Patient is a 75 y.o. female with history of chronic systolic heart failure, mitral valve replacement (mechanical valve)-prior history of GI bleeding due to gastric/duodenal ulcers-presenting with dark-colored FOBT positive stools and a hemoglobin of 6.9-in the setting of supratherapeutic INR.  See below for further details.  Significant events: 4/11>> admit for upper GI bleeding-acute blood loss anemia-supratherapeutic INR.  Significant studies: 4/11>> chest x-ray: Possible airspace disease in the left base 4/13>> Echo: EF 20-25%, old septal segment/basal inferior wall appears akinetic, rest of the LV wall severely hypokinetic.  Antimicrobial therapy: Vancomycin: 4/11 x 1 Flagyl: 4/11 x 1 Cefepime: 4/11 x 1  Microbiology data: 4/11>> blood culture: No growth 4/11>> Covid/influenza PCR: Negative  Procedures : None  Consults: GI, cardiology  DVT Prophylaxis : SCDs Start: 07/20/20 2158   Subjective: No BM for the past 2 days.  Lying comfortably in bed.  Assessment/Plan: Upper GI bleeding with acute blood loss anemia: No further hematochezia for the past 2 days-hemoglobin stable-GI following for EGD when INR allows.  Remains on PPI.  Continue to follow CBC.  Supratherapeutic INR: Has mechanical mitral valve-cardiology following-recommendations are to allow INR to drift down-without the use of vitamin K-unless she is hemodynamically unstable.  AKI: Hemodynamically mediated-slowly improving.  Thrombocytopenia: Appears to be mild-stable for follow-up without any further work-up.  Chronic systolic heart failure: Euvolemic-plans are to gently restart her medications over the next few days  History of severe mitral regurgitation-s/p  mechanical mitral valve replacement: On chronic Coumadin therapy-presented with supratherapeutic INR-due to low flow state-cardiology recommends not to reverse INR with vitamin K unless she is hemodynamically unstable.  Obesity: Estimated body mass index is 30.46 kg/m as calculated from the following:   Height as of this encounter: 5\' 3"  (1.6 m).   Weight as of this encounter: 78 kg.    Diet: Diet Order            Diet Carb Modified Fluid consistency: Thin; Room service appropriate? Yes  Diet effective now                  Code Status: Full code   Family Communication: None at bedside  Disposition Plan: Status is: Inpatient  Remains inpatient appropriate because:Inpatient level of care appropriate due to severity of illness   Dispo: The patient is from: Home              Anticipated d/c is to: Home              Patient currently is not medically stable to d/c.   Difficult to place patient No        Barriers to Discharge:  Antimicrobial agents: Anti-infectives (From admission, onward)   Start     Dose/Rate Route Frequency Ordered Stop   07/21/20 2000  ceFEPIme (MAXIPIME) 2 g in sodium chloride 0.9 % 100 mL IVPB  Status:  Discontinued        2 g 200 mL/hr over 30 Minutes Intravenous Every 24 hours 07/20/20 2037 07/20/20 2156   07/21/20 2000  ceFEPIme (MAXIPIME) 2 g in sodium chloride 0.9 % 100 mL IVPB  Status:  Discontinued  2 g 200 mL/hr over 30 Minutes Intravenous Every 24 hours 07/21/20 0234 07/21/20 1604   07/21/20 0330  vancomycin (VANCOREADY) IVPB 750 mg/150 mL  Status:  Discontinued        750 mg 150 mL/hr over 60 Minutes Intravenous Every 48 hours 07/21/20 0234 07/21/20 1604   07/20/20 1915  ceFEPIme (MAXIPIME) 2 g in sodium chloride 0.9 % 100 mL IVPB        2 g 200 mL/hr over 30 Minutes Intravenous  Once 07/20/20 1905 07/20/20 2029   07/20/20 1915  metroNIDAZOLE (FLAGYL) IVPB 500 mg        500 mg 100 mL/hr over 60 Minutes Intravenous  Once  07/20/20 1905 07/20/20 2137       Time spent: 25- minutes-Greater than 50% of this time was spent in counseling, explanation of diagnosis, planning of further management, and coordination of care.  MEDICATIONS: Scheduled Meds: . insulin aspart  0-6 Units Subcutaneous Q6H  . pantoprazole  40 mg Oral BID   Continuous Infusions: . lactated ringers     PRN Meds:.acetaminophen, ondansetron (ZOFRAN) IV   PHYSICAL EXAM: Vital signs: Vitals:   07/21/20 2338 07/22/20 0354 07/22/20 0734 07/22/20 1231  BP: 100/62 117/66 99/74 110/68  Pulse: 68 80 85 86  Resp: 18 14 20 20   Temp: 98.9 F (37.2 C) 98.7 F (37.1 C) 98.3 F (36.8 C) 98.6 F (37 C)  TempSrc: Axillary Axillary Oral Oral  SpO2: 97% 100% 99% 96%  Weight:      Height:       Filed Weights   07/20/20 1830  Weight: 78 kg   Body mass index is 30.46 kg/m.   Gen Exam:Alert awake-not in any distress HEENT:atraumatic, normocephalic Chest: B/L clear to auscultation anteriorly CVS:S1S2 regular Abdomen:soft non tender, non distended Extremities:no edema Neurology: Non focal Skin: no rash  I have personally reviewed following labs and imaging studies  LABORATORY DATA: CBC: Recent Labs  Lab 07/20/20 1831 07/21/20 0131 07/21/20 0929 07/21/20 1134 07/21/20 1832 07/22/20 0630  WBC 10.8*  --  11.7* 10.9* 10.1 8.9  NEUTROABS 9.2*  --  9.3*  --   --   --   HGB 6.9* 9.6* 12.2  12.3 11.1* 10.7* 10.8*  HCT 22.0* 31.1* 36.3  36.5 33.3* 32.0* 32.9*  MCV 100.9*  --  92.1 92.0 92.2 93.7  PLT 177  --  107* 105* 113* 105*    Basic Metabolic Panel: Recent Labs  Lab 07/20/20 1831 07/21/20 0929 07/22/20 0630  NA 139 144 144  K 4.2 4.2 4.3  CL 108 114* 112*  CO2 24 23 28   GLUCOSE 271* 188* 112*  BUN 77* 64* 52*  CREATININE 1.88* 1.60* 1.29*  CALCIUM 8.4* 8.2* 8.4*  MG  --  1.7  1.8 1.7  PHOS  --   --  2.6    GFR: Estimated Creatinine Clearance: 36.7 mL/min (A) (by C-G formula based on SCr of 1.29 mg/dL  (H)).  Liver Function Tests: Recent Labs  Lab 07/20/20 1831 07/21/20 0929  AST 14* 16  ALT 15 13  ALKPHOS 38 35*  BILITOT 0.5 0.7  PROT 5.4* 5.0*  ALBUMIN 3.1* 3.0*   Recent Labs  Lab 07/20/20 1831  LIPASE 34   No results for input(s): AMMONIA in the last 168 hours.  Coagulation Profile: Recent Labs  Lab 07/20/20 1858 07/21/20 0929 07/21/20 1452 07/22/20 0630  INR 5.6* 5.8* 7.6* 6.9*    Cardiac Enzymes: No results for input(s): CKTOTAL, CKMB, CKMBINDEX, TROPONINI  in the last 168 hours.  BNP (last 3 results) No results for input(s): PROBNP in the last 8760 hours.  Lipid Profile: No results for input(s): CHOL, HDL, LDLCALC, TRIG, CHOLHDL, LDLDIRECT in the last 72 hours.  Thyroid Function Tests: Recent Labs    07/21/20 0929  TSH 0.272*    Anemia Panel: Recent Labs    07/20/20 1952  VITAMINB12 310  FOLATE 14.5  FERRITIN 20  TIBC 322  IRON 127  RETICCTPCT 3.4*    Urine analysis:    Component Value Date/Time   COLORURINE YELLOW 07/20/2020 2355   APPEARANCEUR HAZY (A) 07/20/2020 2355   LABSPEC 1.014 07/20/2020 2355   PHURINE 5.0 07/20/2020 2355   GLUCOSEU NEGATIVE 07/20/2020 2355   HGBUR LARGE (A) 07/20/2020 2355   BILIRUBINUR NEGATIVE 07/20/2020 2355   KETONESUR 5 (A) 07/20/2020 2355   PROTEINUR NEGATIVE 07/20/2020 2355   UROBILINOGEN 1.0 05/10/2011 1440   NITRITE NEGATIVE 07/20/2020 2355   LEUKOCYTESUR TRACE (A) 07/20/2020 2355    Sepsis Labs: Lactic Acid, Venous    Component Value Date/Time   LATICACIDVEN 1.8 07/21/2020 0107    MICROBIOLOGY: Recent Results (from the past 240 hour(s))  Blood Culture (routine x 2)     Status: None (Preliminary result)   Collection Time: 07/20/20  7:33 PM   Specimen: BLOOD  Result Value Ref Range Status   Specimen Description BLOOD SITE NOT SPECIFIED  Final   Special Requests   Final    BOTTLES DRAWN AEROBIC AND ANAEROBIC Blood Culture results may not be optimal due to an inadequate volume of blood  received in culture bottles   Culture   Final    NO GROWTH 2 DAYS Performed at [facility], [address], [place], [place] [address]    Report Status PENDING  Incomplete  Resp Panel by RT-PCR (Flu A&B, Covid) Nasopharyngeal Swab     Status: None   Collection Time: 07/20/20  7:46 PM   Specimen: Nasopharyngeal Swab; Nasopharyngeal(NP) swabs in vial transport medium  Result Value Ref Range Status   SARS Coronavirus 2 by RT PCR NEGATIVE NEGATIVE Final    Comment: (NOTE) SARS-CoV-2 target nucleic acids are NOT DETECTED.  The SARS-CoV-2 RNA is generally detectable in upper respiratory specimens during the acute phase of infection. The lowest concentration of SARS-CoV-2 viral copies this assay can detect is 138 copies/mL. A negative result does not preclude SARS-Cov-2 infection and should not be used as the sole basis for treatment or other patient management decisions. A negative result may occur with  improper specimen collection/handling, submission of specimen other than nasopharyngeal swab, presence of viral mutation(s) within the areas targeted by this assay, and inadequate number of viral copies(<138 copies/mL). A negative result must be combined with clinical observations, patient history, and epidemiological information. The expected result is Negative.  Fact Sheet for Patients:  BloggerCourse.com  Fact Sheet for Healthcare Providers:  SeriousBroker.it  This test is no t yet approved or cleared by the [place] FDA and  has been authorized for detection and/or diagnosis of SARS-CoV-2 by FDA under an Emergency Use Authorization (EUA). This EUA will remain  in effect (meaning this test can be used) for the duration of the COVID-19 declaration under Section 564(b)(1) of the Act, 21 U.S.C.section 360bbb-3(b)(1), unless the authorization is terminated  or revoked sooner.       Influenza A by PCR NEGATIVE NEGATIVE  Final   Influenza B by PCR NEGATIVE NEGATIVE Final    Comment: (NOTE) The Xpert  Xpress SARS-CoV-2/FLU/RSV plus assay is intended as an aid in the diagnosis of influenza from Nasopharyngeal swab specimens and should not be used as a sole basis for treatment. Nasal washings and aspirates are unacceptable for Xpert Xpress SARS-CoV-2/FLU/RSV testing.  Fact Sheet for Patients: BloggerCourse.com  Fact Sheet for Healthcare Providers: SeriousBroker.it  This test is not yet approved or cleared by the [place] FDA and has been authorized for detection and/or diagnosis of SARS-CoV-2 by FDA under an Emergency Use Authorization (EUA). This EUA will remain in effect (meaning this test can be used) for the duration of the COVID-19 declaration under Section 564(b)(1) of the Act, 21 U.S.C. section 360bbb-3(b)(1), unless the authorization is terminated or revoked.  Performed at [facility], [address], [place], [place] [address]   Blood Culture (routine x 2)     Status: None (Preliminary result)   Collection Time: 07/20/20  7:47 PM   Specimen: BLOOD  Result Value Ref Range Status   Specimen Description BLOOD SITE NOT SPECIFIED  Final   Special Requests   Final    BOTTLES DRAWN AEROBIC AND ANAEROBIC Blood Culture results may not be optimal due to an inadequate volume of blood received in culture bottles   Culture   Final    NO GROWTH 2 DAYS Performed at [facility], [address], [place], [place] [address]    Report Status PENDING  Incomplete    RADIOLOGY STUDIES/RESULTS: DG Chest Port 1 View  Result Date: 07/20/2020 CLINICAL DATA:  Possible sepsis EXAM: PORTABLE CHEST 1 VIEW COMPARISON:  04/11/2012 FINDINGS: Post sternotomy changes. Fracture through superior sternal wire as before. Left-sided single lead pacing device over the right ventricle. Cardiomegaly. Possible airspace disease at left base with poorly visible  diaphragm. The right lung is clear. No pneumothorax. IMPRESSION: 1. Possible airspace disease at the left base with poorly visible diaphragm. 2. Cardiomegaly. Electronically Signed   By: Jasmine Pang M.D.   On: 07/20/2020 19:57   ECHOCARDIOGRAM COMPLETE  Result Date: 07/22/2020    ECHOCARDIOGRAM REPORT   Patient Name:   CAMEY EDELL Date of Exam: 07/22/2020 Medical Rec #:  [id number]      Height:       63.0 in Accession #:    [id number]     Weight:       172.0 lb Date of Birth:  Nov 26, 1945       BSA:          1.813 m Patient Age:    74 years       BP:           99/74 mmHg Patient Gender: F              HR:           91 bpm. Exam Location:  Inpatient Procedure: 2D Echo, Cardiac Doppler and Color Doppler Indications:    CHF-Acute Systolic I50.21  History:        Patient has prior history of Echocardiogram examinations, most                 recent 06/18/2018. CHF; Risk Factors:Hypertension, Dyslipidemia                 and Diabetes.  Sonographer:    Eulah Pont RDCS Referring Phys: [id number] RHONDA G BARRETT IMPRESSIONS  1. Left ventricular ejection fraction, by estimation, is 20 to 25%. The left ventricle has severely decreased function. The left ventricle demonstrates regional wall motion abnormalities.  All septal segments and the basal inferior segment appear akinetic. The rest of the LV walls appears severely hypokinetic. The left ventricular internal cavity size was moderately dilated. Diastolic function indeterminant due to mechanical mitral valve.  2. Right ventricular systolic function is normal. The right ventricular size is normal. There is moderately elevated pulmonary artery systolic pressure. The estimated right ventricular systolic pressure is 45.8 mmHg.  3. Left atrial size was severely dilated.  4. The mitral valve has been repaired/replaced. A mechanical St. Jude mitral valve is present and appears well seated with normal function by doppler interrogation. Mean gradient at HR 86bpm. There  is trivial mitral regurgitation.  5. The aortic valve is tricuspid. There is mild thickening of the aortic valve. Aortic valve regurgitation is not visualized. Mild aortic valve sclerosis is present, with no evidence of aortic valve stenosis.  6. The inferior vena cava is normal in size with greater than 50% respiratory variability, suggesting right atrial pressure of 3 mmHg. Comparison(s): No significant change from prior study. FINDINGS  Left Ventricle: Left ventricular ejection fraction, by estimation, is 20 to 25%. The left ventricle has severely decreased function. The left ventricle demonstrates regional wall motion abnormalities. All septal segments and the basal inferior segment appear akinetic. The rest of the LV walls appears severely hypokinetic. The left ventricular internal cavity size was moderately dilated. There is no left ventricular hypertrophy. Diastolic function indeterminant due to mechanical mitral valve. Right Ventricle: The right ventricular size is normal. No increase in right ventricular wall thickness. Right ventricular systolic function is normal. There is moderately elevated pulmonary artery systolic pressure. The tricuspid regurgitant velocity is 3.27 m/s, and with an assumed right atrial pressure of 3 mmHg, the estimated right ventricular systolic pressure is 45.8 mmHg. Left Atrium: Left atrial size was severely dilated. Right Atrium: Right atrial size was normal in size. Pericardium: Trivial pericardial effusion is present. Mitral Valve: A mechanical St. Jude mitral valve is present and appears well seated with normal function by doppler interrogation. Mean gradient at HR 86bpm. There is trivial mitral regurgitation. The mitral valve has been repaired/replaced. Trivial mitral valve regurgitation. There is a St. Jude mechanical valve present in the mitral position. MV peak gradient, 6.6 mmHg. The mean mitral valve gradient is 5.0 mmHg. Tricuspid Valve: The tricuspid valve is  normal in structure. Tricuspid valve regurgitation is mild. Aortic Valve: The aortic valve is tricuspid. There is mild thickening of the aortic valve. Aortic valve regurgitation is not visualized. Mild aortic valve sclerosis is present, with no evidence of aortic valve stenosis. Pulmonic Valve: The pulmonic valve was normal in structure. Pulmonic valve regurgitation is not visualized. Aorta: The aortic root and ascending aorta are structurally normal, with no evidence of dilitation. Venous: The inferior vena cava is normal in size with greater than 50% respiratory variability, suggesting right atrial pressure of 3 mmHg. IAS/Shunts: No atrial level shunt detected by color flow Doppler. Additional Comments: A device lead is visualized.  LEFT VENTRICLE PLAX 2D LVIDd:         5.30 cm LVIDs:         4.60 cm LV PW:         1.00 cm LV IVS:        0.80 cm LVOT diam:     1.70 cm LV SV:         36 LV SV Index:   20 LVOT Area:     2.27 cm  LV Volumes (MOD) LV vol d,  MOD A2C: 111.0 ml LV vol d, MOD A4C: 92.4 ml LV vol s, MOD A2C: 60.1 ml LV vol s, MOD A4C: 62.1 ml LV SV MOD A2C:     50.9 ml LV SV MOD A4C:     92.4 ml LV SV MOD BP:      42.4 ml RIGHT VENTRICLE RV S prime:     9.73 cm/s TAPSE (M-mode): 2.0 cm LEFT ATRIUM              Index       RIGHT ATRIUM           Index LA diam:        2.80 cm  1.54 cm/m  RA Area:     12.50 cm LA Vol (A2C):   95.5 ml  52.66 ml/m RA Volume:   23.70 ml  13.07 ml/m LA Vol (A4C):   111.0 ml 61.21 ml/m LA Biplane Vol: 103.0 ml 56.80 ml/m  AORTIC VALVE LVOT Vmax:   109.12 cm/s LVOT Vmean:  66.625 cm/s LVOT VTI:    0.158 m  AORTA Ao Root diam: 2.70 cm Ao Asc diam:  2.90 cm MITRAL VALVE                TRICUSPID VALVE MV Area (PHT): 2.11 cm     TR Peak grad:   42.8 mmHg MV Area VTI:   1.26 cm     TR Vmax:        327.00 cm/s MV Peak grad:  6.6 mmHg MV Mean grad:  5.0 mmHg     SHUNTS MV Vmax:       1.28 m/s     Systemic VTI:  0.16 m MV Vmean:      111.0 cm/s   Systemic Diam: 1.70 cm MV Decel  Time: 359 msec MR Peak grad: 110.7 mmHg MR Mean grad: 67.0 mmHg MR Vmax:      526.00 cm/s MR Vmean:     387.0 cm/s MV E velocity: 106.00 cm/s MV A velocity: 105.00 cm/s MV E/A ratio:  1.01 Laurance Flatten MD Electronically signed by Laurance Flatten MD Signature Date/Time: 07/22/2020/12:42:50 PM    Final      LOS: 2 days   Jeoffrey Massed, MD  [facility]    To contact the attending provider between 7A-7P or the covering provider during after hours 7P-7A, please log into the web site [email or web address] and access using universal [place] password for that web site. If you do not have the password, please call the hospital operator.  07/22/2020, 4:10 PM

## 2020-07-22 NOTE — Progress Notes (Signed)
  Echocardiogram 2D Echocardiogram has been performed.  Deborah [name] 07/22/2020, 11:53 AM

## 2020-07-22 NOTE — Progress Notes (Signed)
Progress Note  Patient Name: Deborah [name] Date of Encounter: 07/22/2020  [facility] Cardiologist: Arvilla Meres, MD   Subjective   Comfortable, quiet, no chest pain no shortness of breath  Inpatient Medications    Scheduled Meds: . insulin aspart  0-6 Units Subcutaneous Q6H   Continuous Infusions: . lactated ringers    . pantoprozole (PROTONIX) infusion 8 mg/hr (07/22/20 1022)   PRN Meds: acetaminophen, ondansetron (ZOFRAN) IV   Vital Signs    Vitals:   07/21/20 1958 07/21/20 2338 07/22/20 0354 07/22/20 0734  BP: 92/73 100/62 117/66 99/74  Pulse: 73 68 80 85  Resp: 20 18 14 20   Temp: 99 F (37.2 C) 98.9 F (37.2 C) 98.7 F (37.1 C) 98.3 F (36.8 C)  TempSrc: Axillary Axillary Axillary Oral  SpO2: 97% 97% 100% 99%  Weight:      Height:        Intake/Output Summary (Last 24 hours) at 07/22/2020 1035 Last data filed at 07/22/2020 0000 Gross per 24 hour  Intake 433.06 ml  Output 750 ml  Net -316.94 ml   Last 3 Weights 07/20/2020 02/01/2019 06/11/2018  Weight (lbs) 171 lb 15.3 oz 171 lb 166 lb 12.8 oz  Weight (kg) 78 kg 77.565 kg 75.66 kg      Telemetry    Sinus rhythm interventricular conduction delay- Personally Reviewed  ECG    Left bundle branch block at baseline- Personally Reviewed  Physical Exam   GEN: No acute distress.   Neck: No JVD Cardiac: RRR, sharp S1 click,no murmurs, rubs, or gallops.  Respiratory: Clear to auscultation bilaterally. GI: Soft, nontender, non-distended  MS: No edema; No deformity. Neuro:  Nonfocal  Psych: Normal affect   Labs    High Sensitivity Troponin:  No results for input(s): TROPONINIHS in the last 720 hours.    Chemistry Recent Labs  Lab 07/20/20 1831 07/21/20 0929 07/22/20 0630  NA 139 144 144  K 4.2 4.2 4.3  CL 108 114* 112*  CO2 24 23 28   GLUCOSE 271* 188* 112*  BUN 77* 64* 52*  CREATININE 1.88* 1.60* 1.29*  CALCIUM 8.4* 8.2* 8.4*  PROT 5.4* 5.0*  --   ALBUMIN 3.1* 3.0*  --   AST 14*  16  --   ALT 15 13  --   ALKPHOS 38 35*  --   BILITOT 0.5 0.7  --   GFRNONAA 27* 33* 43*  ANIONGAP 7 7 4*     Hematology Recent Labs  Lab 07/21/20 1134 07/21/20 1832 07/22/20 0630  WBC 10.9* 10.1 8.9  RBC 3.62* 3.47* 3.51*  HGB 11.1* 10.7* 10.8*  HCT 33.3* 32.0* 32.9*  MCV 92.0 92.2 93.7  MCH 30.7 30.8 30.8  MCHC 33.3 33.4 32.8  RDW 16.1* 16.4* 16.4*  PLT 105* 113* 105*    BNPNo results for input(s): BNP, PROBNP in the last 168 hours.   DDimer No results for input(s): DDIMER in the last 168 hours.   Radiology    DG Chest Port 1 View  Result Date: 07/20/2020 CLINICAL DATA:  Possible sepsis EXAM: PORTABLE CHEST 1 VIEW COMPARISON:  04/11/2012 FINDINGS: Post sternotomy changes. Fracture through superior sternal wire as before. Left-sided single lead pacing device over the right ventricle. Cardiomegaly. Possible airspace disease at left base with poorly visible diaphragm. The right lung is clear. No pneumothorax. IMPRESSION: 1. Possible airspace disease at the left base with poorly visible diaphragm. 2. Cardiomegaly. Electronically Signed   By: 09/19/2020 M.D.   On: 07/20/2020  19:57    Cardiac Studies   Cardiogram currently pending.  Patient Profile     75 y.o. female here with GI bleed, supratherapeutic INR increased with IV antibiotics, now decreasing, nonischemic cardiomyopathy, ICD in place, EF 20 to 25%  Assessment & Plan    GI bleed in the setting of mechanical mitral valve and nonischemic cardiomyopathy EF 25% -I think it is imperative to allow her INR to drift down without the use of vitamin K unless she is in distress/bleeding.  She would be at high risk for mechanical valve complication/thrombosis especially given her low flow state. -Thankfully she is compensated at this point.  Chronic systolic heart failure -On goal-directed therapy at home but currently on hold given recent bleed and possible hemodynamic compromise.  Soft 99 systolic.  No changes made.   We will gently add back medications when it is safe to do so.    For questions or updates, please contact [facility] Please consult [email or web address] for contact info under        Signed, Donato Schultz, MD  07/22/2020, 10:35 AM

## 2020-07-22 NOTE — Progress Notes (Signed)
Daily Rounding Note  07/22/2020, 10:24 AM  LOS: 2 days   SUBJECTIVE:   Chief complaint: Anemia.  Nausea, vomiting, FOBT positive.  Coagulopathy inpatient on Coumadin.     No further nausea, vomiting.  No abdominal pain.  Feels a bit weak and concerned that she is not ambulating and she is going to lose strength.  No bowel movements.  OBJECTIVE:         Vital signs in last 24 hours:    Temp:  [98.3 F (36.8 C)-99.1 F (37.3 C)] 98.3 F (36.8 C) (04/13 0734) Pulse Rate:  [64-85] 85 (04/13 0734) Resp:  [14-20] 20 (04/13 0734) BP: (92-121)/(57-74) 99/74 (04/13 0734) SpO2:  [92 %-100 %] 99 % (04/13 0734) Last BM Date: 07/21/20 Filed Weights   07/20/20 1830  Weight: 78 kg   General: NAD Heart: RRR Chest: Clear bilaterally.  No labored breathing or cough. Abdomen: Soft.  Without tenderness.  Active bowel sounds.  No distention Extremities: No CCE. Neuro/Psych: Appropriate.  Moves all 4 limbs without obvious deficit.  Oriented x3.  Intake/Output from previous day: 04/12 0701 - 04/13 0700 In: 433.1 [P.O.:240; I.V.:193.1] Out: 750 [Urine:750]  Intake/Output this shift: No intake/output data recorded.  Lab Results: Recent Labs    07/21/20 1134 07/21/20 1832 07/22/20 0630  WBC 10.9* 10.1 8.9  HGB 11.1* 10.7* 10.8*  HCT 33.3* 32.0* 32.9*  PLT 105* 113* 105*   BMET Recent Labs    07/20/20 1831 07/21/20 0929 07/22/20 0630  NA 139 144 144  K 4.2 4.2 4.3  CL 108 114* 112*  CO2 24 23 28   GLUCOSE 271* 188* 112*  BUN 77* 64* 52*  CREATININE 1.88* 1.60* 1.29*  CALCIUM 8.4* 8.2* 8.4*   LFT Recent Labs    07/20/20 1831 07/21/20 0929  PROT 5.4* 5.0*  ALBUMIN 3.1* 3.0*  AST 14* 16  ALT 15 13  ALKPHOS 38 35*  BILITOT 0.5 0.7   PT/INR Recent Labs    07/21/20 1452 07/22/20 0630  LABPROT 64.7* 59.9*  INR 7.6* 6.9*   Hepatitis Panel No results for input(s): HEPBSAG, HCVAB, HEPAIGM, HEPBIGM in the  last 72 hours.  Studies/Results: DG Chest Port 1 View  Result Date: 07/20/2020 CLINICAL DATA:  Possible sepsis EXAM: PORTABLE CHEST 1 VIEW COMPARISON:  04/11/2012 FINDINGS: Post sternotomy changes. Fracture through superior sternal wire as before. Left-sided single lead pacing device over the right ventricle. Cardiomegaly. Possible airspace disease at left base with poorly visible diaphragm. The right lung is clear. No pneumothorax. IMPRESSION: 1. Possible airspace disease at the left base with poorly visible diaphragm. 2. Cardiomegaly. Electronically Signed   By: 06/09/2012 M.D.   On: 07/20/2020 19:57   Scheduled Meds: . insulin aspart  0-6 Units Subcutaneous Q6H   Continuous Infusions: . lactated ringers    . pantoprozole (PROTONIX) infusion 8 mg/hr (07/22/20 1022)   PRN Meds:.acetaminophen, ondansetron (ZOFRAN) IV   ASSESMENT:   *    Nonbloody nausea and vomiting but black, liquid, FOBT+ stool on DRE. No stools or nausea vomiting since admission. Suspect recurrent ulcer disease. 11/2014 upper GI bleed.  EGD then revealed nonbleeding gastric and duodenal ulcers.  H. pylori positive then, ?was this ever treated?  Not on any gastric acid suppressing medications currently.  Protonix drip day 2  *    Meets sepsis criteria w lactic acidosis, now resolved.  Treated with vancomycin, cefepime but these have been discontinued.  *   Iatrogenic  coagulopathy.  On chronic Coumadin for mechanical MVR.  Coumadin on hold. INR 5.6 >> 7.6 >> 6.9.  Has not received vitamin K, cardiology monitoring.  *    Blood loss anemia.  Perhaps anemia of chronic disease as well. Hgb 6.9 >> 12.3 >> 10.8. 4 PRBCs thus far, last early AM 4/12.   Iron, TIBC, ferritin, B12, folate WNL.  Elevated iron sats.    *    Thrombocytopenia.  Platelets 105K.  *    AKI, improved.  Hx stage 3a CKD.     *   Systolic CHF.   PLAN   *   EGD when INR corrected.  Ideally should be 1.5 but may accept higher levels before  proceeding to EGD.  *   Continue solid foods.  *   Stop the Protonix drip and switched to Protonix 40 mg po bid    Jennye Moccasin  07/22/2020, 10:24 AM Phone [phone]

## 2020-07-23 DIAGNOSIS — K922 Gastrointestinal hemorrhage, unspecified: Secondary | ICD-10-CM | POA: Diagnosis not present

## 2020-07-23 LAB — GLUCOSE, CAPILLARY
Glucose-Capillary: 105 mg/dL — ABNORMAL HIGH (ref 70–99)
Glucose-Capillary: 109 mg/dL — ABNORMAL HIGH (ref 70–99)
Glucose-Capillary: 112 mg/dL — ABNORMAL HIGH (ref 70–99)
Glucose-Capillary: 114 mg/dL — ABNORMAL HIGH (ref 70–99)
Glucose-Capillary: 167 mg/dL — ABNORMAL HIGH (ref 70–99)
Glucose-Capillary: 196 mg/dL — ABNORMAL HIGH (ref 70–99)

## 2020-07-23 LAB — BASIC METABOLIC PANEL
Anion gap: 3 — ABNORMAL LOW (ref 5–15)
BUN: 50 mg/dL — ABNORMAL HIGH (ref 8–23)
CO2: 27 mmol/L (ref 22–32)
Calcium: 8 mg/dL — ABNORMAL LOW (ref 8.9–10.3)
Chloride: 111 mmol/L (ref 98–111)
Creatinine, Ser: 1.33 mg/dL — ABNORMAL HIGH (ref 0.44–1.00)
GFR, Estimated: 41 mL/min — ABNORMAL LOW (ref >60)
Glucose, Bld: 108 mg/dL — ABNORMAL HIGH (ref 70–99)
Potassium: 4 mmol/L (ref 3.5–5.1)
Sodium: 141 mmol/L (ref 135–145)

## 2020-07-23 LAB — CBC
HCT: 26.5 % — ABNORMAL LOW (ref 36.0–46.0)
Hemoglobin: 8.8 g/dL — ABNORMAL LOW (ref 12.0–15.0)
MCH: 31.7 pg (ref 26.0–34.0)
MCHC: 33.2 g/dL (ref 30.0–36.0)
MCV: 95.3 fL (ref 80.0–100.0)
Platelets: 107 10*3/uL — ABNORMAL LOW (ref 150–400)
RBC: 2.78 MIL/uL — ABNORMAL LOW (ref 3.87–5.11)
RDW: 15.9 % — ABNORMAL HIGH (ref 11.5–15.5)
WBC: 8.6 10*3/uL (ref 4.0–10.5)
nRBC: 0.4 % — ABNORMAL HIGH (ref 0.0–0.2)

## 2020-07-23 LAB — URINE CULTURE: Culture: NO GROWTH

## 2020-07-23 LAB — PROTIME-INR
INR: 4 — ABNORMAL HIGH (ref 0.8–1.2)
Prothrombin Time: 38.6 seconds — ABNORMAL HIGH (ref 11.4–15.2)

## 2020-07-23 MED ORDER — POLYETHYLENE GLYCOL 3350 17 G PO PACK
17.0000 g | PACK | Freq: Every day | ORAL | Status: DC | PRN
  Administered 2020-07-23 – 2020-07-24 (×2): 17 g via ORAL
  Filled 2020-07-23 (×2): qty 1

## 2020-07-23 NOTE — Progress Notes (Signed)
PROGRESS NOTE        PATIENT DETAILS Name: Deborah [name] Age: 75 y.o. Sex: female Date of Birth: May 19, 1945 Admit Date: 07/20/2020 Admitting Physician Angie Fava, DO DEY:[id number], Campbell Lerner, MD  Brief Narrative: Patient is a 75 y.o. female with history of chronic systolic heart failure, mitral valve replacement (mechanical valve)-prior history of GI bleeding due to gastric/duodenal ulcers-presenting with dark-colored FOBT positive stools and a hemoglobin of 6.9-in the setting of supratherapeutic INR.  See below for further details.  Significant events: 4/11>> admit for upper GI bleeding-acute blood loss anemia-supratherapeutic INR.  Significant studies: 4/11>> chest x-ray: Possible airspace disease in the left base 4/13>> Echo: EF 20-25%, old septal segment/basal inferior wall appears akinetic, rest of the LV wall severely hypokinetic.  Antimicrobial therapy: Vancomycin: 4/11 x 1 Flagyl: 4/11 x 1 Cefepime: 4/11 x 1  Microbiology data: 4/11>> blood culture: No growth 4/11>> Covid/influenza PCR: Negative  Procedures : None  Consults: GI, cardiology  DVT Prophylaxis : SCDs Start: 07/20/20 2158   Subjective: No BM for the past 3 days-wants to try MiraLAX.  Assessment/Plan: Upper GI bleeding with acute blood loss anemia: No BM for the past 3 days-hemoglobin slightly reduced compared to yesterday but suspect this is equilibration rather than ongoing bleeding.  Continue PPI-GI following with plans to pursue EGD when INR acceptable.    Supratherapeutic INR: Has mechanical mitral valve-cardiology following-recommendations are to allow INR to drift down-without the use of vitamin K-unless she is hemodynamically unstable.  INR slowly downtrending.  AKI: Hemodynamically mediated-slowly improving.  Thrombocytopenia: Appears to be mild-stable for follow-up without any further work-up.  Chronic systolic heart failure: Euvolemic-plans are to gently  restart her medications over the next few days  History of severe mitral regurgitation-s/p mechanical mitral valve replacement: On chronic Coumadin therapy-presented with supratherapeutic INR-due to low flow state-cardiology recommends not to reverse INR with vitamin K unless she is hemodynamically unstable.  Obesity: Estimated body mass index is 27.26 kg/m as calculated from the following:   Height as of this encounter: 5\' 3"  (1.6 m).   Weight as of this encounter: 69.8 kg.    Diet: Diet Order            Diet Carb Modified Fluid consistency: Thin; Room service appropriate? Yes  Diet effective now                  Code Status: Full code   Family Communication: Sister-Norma [phone] voicemail on 4/14  Disposition Plan: Status is: Inpatient  Remains inpatient appropriate because:Inpatient level of care appropriate due to severity of illness   Dispo: The patient is from: Home              Anticipated d/c is to: Home              Patient currently is not medically stable to d/c.   Difficult to place patient No    Barriers to Discharge: Awaiting EGD once INR is acceptable.  Antimicrobial agents: Anti-infectives (From admission, onward)   Start     Dose/Rate Route Frequency Ordered Stop   07/21/20 2000  ceFEPIme (MAXIPIME) 2 g in sodium chloride 0.9 % 100 mL IVPB  Status:  Discontinued        2 g 200 mL/hr over 30 Minutes Intravenous Every 24 hours 07/20/20 2037 07/20/20 2156   07/21/20 2000  ceFEPIme (  MAXIPIME) 2 g in sodium chloride 0.9 % 100 mL IVPB  Status:  Discontinued        2 g 200 mL/hr over 30 Minutes Intravenous Every 24 hours 07/21/20 0234 07/21/20 1604   07/21/20 0330  vancomycin (VANCOREADY) IVPB 750 mg/150 mL  Status:  Discontinued        750 mg 150 mL/hr over 60 Minutes Intravenous Every 48 hours 07/21/20 0234 07/21/20 1604   07/20/20 1915  ceFEPIme (MAXIPIME) 2 g in sodium chloride 0.9 % 100 mL IVPB        2 g 200 mL/hr over 30 Minutes  Intravenous  Once 07/20/20 1905 07/20/20 2029   07/20/20 1915  metroNIDAZOLE (FLAGYL) IVPB 500 mg        500 mg 100 mL/hr over 60 Minutes Intravenous  Once 07/20/20 1905 07/20/20 2137       Time spent: 25- minutes-Greater than 50% of this time was spent in counseling, explanation of diagnosis, planning of further management, and coordination of care.  MEDICATIONS: Scheduled Meds: . insulin aspart  0-6 Units Subcutaneous Q6H  . pantoprazole  40 mg Oral BID   Continuous Infusions: . lactated ringers     PRN Meds:.acetaminophen, ondansetron (ZOFRAN) IV, polyethylene glycol   PHYSICAL EXAM: Vital signs: Vitals:   07/23/20 0346 07/23/20 0650 07/23/20 0728 07/23/20 1147  BP: 121/72  (!) 116/58 101/64  Pulse: 77  84 100  Resp: 15  17 20   Temp: 97.7 F (36.5 C)  97.6 F (36.4 C) 97.6 F (36.4 C)  TempSrc: Oral  Oral Oral  SpO2: 99%  98% 98%  Weight:  69.8 kg    Height:       Filed Weights   07/20/20 1830 07/23/20 0650  Weight: 78 kg 69.8 kg   Body mass index is 27.26 kg/m.   Gen Exam:Alert awake-not in any distress HEENT:atraumatic, normocephalic Chest: B/L clear to auscultation anteriorly CVS:S1S2 regular Abdomen:soft non tender, non distended Extremities:no edema Neurology: Non focal Skin: no rash  I have personally reviewed following labs and imaging studies  LABORATORY DATA: CBC: Recent Labs  Lab 07/20/20 1831 07/21/20 0131 07/21/20 0929 07/21/20 1134 07/21/20 1832 07/22/20 0630 07/23/20 0058  WBC 10.8*  --  11.7* 10.9* 10.1 8.9 8.6  NEUTROABS 9.2*  --  9.3*  --   --   --   --   HGB 6.9*   < > 12.2  12.3 11.1* 10.7* 10.8* 8.8*  HCT 22.0*   < > 36.3  36.5 33.3* 32.0* 32.9* 26.5*  MCV 100.9*  --  92.1 92.0 92.2 93.7 95.3  PLT 177  --  107* 105* 113* 105* 107*   < > = values in this interval not displayed.    Basic Metabolic Panel: Recent Labs  Lab 07/20/20 1831 07/21/20 0929 07/22/20 0630 07/23/20 0058  NA 139 144 144 141  K 4.2 4.2 4.3  4.0  CL 108 114* 112* 111  CO2 24 23 28 27   GLUCOSE 271* 188* 112* 108*  BUN 77* 64* 52* 50*  CREATININE 1.88* 1.60* 1.29* 1.33*  CALCIUM 8.4* 8.2* 8.4* 8.0*  MG  --  1.7  1.8 1.7  --   PHOS  --   --  2.6  --     GFR: Estimated Creatinine Clearance: 33.7 mL/min (A) (by C-G formula based on SCr of 1.33 mg/dL (H)).  Liver Function Tests: Recent Labs  Lab 07/20/20 1831 07/21/20 0929  AST 14* 16  ALT 15 13  ALKPHOS 38  35*  BILITOT 0.5 0.7  PROT 5.4* 5.0*  ALBUMIN 3.1* 3.0*   Recent Labs  Lab 07/20/20 1831  LIPASE 34   No results for input(s): AMMONIA in the last 168 hours.  Coagulation Profile: Recent Labs  Lab 07/20/20 1858 07/21/20 0929 07/21/20 1452 07/22/20 0630 07/23/20 0058  INR 5.6* 5.8* 7.6* 6.9* 4.0*    Cardiac Enzymes: No results for input(s): CKTOTAL, CKMB, CKMBINDEX, TROPONINI in the last 168 hours.  BNP (last 3 results) No results for input(s): PROBNP in the last 8760 hours.  Lipid Profile: No results for input(s): CHOL, HDL, LDLCALC, TRIG, CHOLHDL, LDLDIRECT in the last 72 hours.  Thyroid Function Tests: Recent Labs    07/21/20 0929  TSH 0.272*    Anemia Panel: Recent Labs    07/20/20 1952  VITAMINB12 310  FOLATE 14.5  FERRITIN 20  TIBC 322  IRON 127  RETICCTPCT 3.4*    Urine analysis:    Component Value Date/Time   COLORURINE YELLOW 07/20/2020 2355   APPEARANCEUR HAZY (A) 07/20/2020 2355   LABSPEC 1.014 07/20/2020 2355   PHURINE 5.0 07/20/2020 2355   GLUCOSEU NEGATIVE 07/20/2020 2355   HGBUR LARGE (A) 07/20/2020 2355   BILIRUBINUR NEGATIVE 07/20/2020 2355   KETONESUR 5 (A) 07/20/2020 2355   PROTEINUR NEGATIVE 07/20/2020 2355   UROBILINOGEN 1.0 05/10/2011 1440   NITRITE NEGATIVE 07/20/2020 2355   LEUKOCYTESUR TRACE (A) 07/20/2020 2355    Sepsis Labs: Lactic Acid, Venous    Component Value Date/Time   LATICACIDVEN 1.8 07/21/2020 0107    MICROBIOLOGY: Recent Results (from the past 240 hour(s))  Urine culture      Status: None   Collection Time: 07/20/20  7:05 PM   Specimen: In/Out Cath Urine  Result Value Ref Range Status   Specimen Description IN/OUT CATH URINE  Final   Special Requests NONE  Final   Culture   Final    NO GROWTH Performed at [facility], [address] [place], [place] [address]    Report Status 07/23/2020 FINAL  Final  Blood Culture (routine x 2)     Status: None (Preliminary result)   Collection Time: 07/20/20  7:33 PM   Specimen: BLOOD  Result Value Ref Range Status   Specimen Description BLOOD SITE NOT SPECIFIED  Final   Special Requests   Final    BOTTLES DRAWN AEROBIC AND ANAEROBIC Blood Culture results may not be optimal due to an inadequate volume of blood received in culture bottles   Culture   Final    NO GROWTH 3 DAYS Performed at [facility], [address], [place], [place] [address]    Report Status PENDING  Incomplete  Resp Panel by RT-PCR (Flu A&B, Covid) Nasopharyngeal Swab     Status: None   Collection Time: 07/20/20  7:46 PM   Specimen: Nasopharyngeal Swab; Nasopharyngeal(NP) swabs in vial transport medium  Result Value Ref Range Status   SARS Coronavirus 2 by RT PCR NEGATIVE NEGATIVE Final    Comment: (NOTE) SARS-CoV-2 target nucleic acids are NOT DETECTED.  The SARS-CoV-2 RNA is generally detectable in upper respiratory specimens during the acute phase of infection. The lowest concentration of SARS-CoV-2 viral copies this assay can detect is 138 copies/mL. A negative result does not preclude SARS-Cov-2 infection and should not be used as the sole basis for treatment or other patient management decisions. A negative result may occur with  improper specimen collection/handling, submission of specimen other than nasopharyngeal swab, presence of viral  mutation(s) within the areas targeted by this assay, and inadequate number of viral copies(<138 copies/mL). A negative result must be combined with clinical observations, patient  history, and epidemiological information. The expected result is Negative.  Fact Sheet for Patients:  BloggerCourse.com  Fact Sheet for Healthcare Providers:  SeriousBroker.it  This test is no t yet approved or cleared by the [place] FDA and  has been authorized for detection and/or diagnosis of SARS-CoV-2 by FDA under an Emergency Use Authorization (EUA). This EUA will remain  in effect (meaning this test can be used) for the duration of the COVID-19 declaration under Section 564(b)(1) of the Act, 21 U.S.C.section 360bbb-3(b)(1), unless the authorization is terminated  or revoked sooner.       Influenza A by PCR NEGATIVE NEGATIVE Final   Influenza B by PCR NEGATIVE NEGATIVE Final    Comment: (NOTE) The Xpert Xpress SARS-CoV-2/FLU/RSV plus assay is intended as an aid in the diagnosis of influenza from Nasopharyngeal swab specimens and should not be used as a sole basis for treatment. Nasal washings and aspirates are unacceptable for Xpert Xpress SARS-CoV-2/FLU/RSV testing.  Fact Sheet for Patients: BloggerCourse.com  Fact Sheet for Healthcare Providers: SeriousBroker.it  This test is not yet approved or cleared by the [place] FDA and has been authorized for detection and/or diagnosis of SARS-CoV-2 by FDA under an Emergency Use Authorization (EUA). This EUA will remain in effect (meaning this test can be used) for the duration of the COVID-19 declaration under Section 564(b)(1) of the Act, 21 U.S.C. section 360bbb-3(b)(1), unless the authorization is terminated or revoked.  Performed at [facility], [address] [place], [place] [address]   Blood Culture (routine x 2)     Status: None (Preliminary result)   Collection Time: 07/20/20  7:47 PM   Specimen: BLOOD  Result Value Ref Range Status   Specimen Description BLOOD SITE NOT SPECIFIED  Final    Special Requests   Final    BOTTLES DRAWN AEROBIC AND ANAEROBIC Blood Culture results may not be optimal due to an inadequate volume of blood received in culture bottles   Culture   Final    NO GROWTH 3 DAYS Performed at [facility], [address] [place], [place] [address]    Report Status PENDING  Incomplete    RADIOLOGY STUDIES/RESULTS: ECHOCARDIOGRAM COMPLETE  Result Date: 07/22/2020    ECHOCARDIOGRAM REPORT   Patient Name:   Deborah [name] Date of Exam: 07/22/2020 Medical Rec #:  [id number]      Height:       63.0 in Accession #:    [id number]     Weight:       172.0 lb Date of Birth:  [birth year]       BSA:          1.813 m Patient Age:    74 years       BP:           99/74 mmHg Patient Gender: F              HR:           91 bpm. Exam Location:  Inpatient Procedure: 2D Echo, Cardiac Doppler and Color Doppler Indications:    CHF-Acute Systolic I50.21  History:        Patient has prior history of Echocardiogram examinations, most                 recent 06/18/2018. CHF; Risk Factors:Hypertension, Dyslipidemia  and Diabetes.  Sonographer:    Eulah Pont RDCS Referring Phys: [id number] RHONDA G BARRETT IMPRESSIONS  1. Left ventricular ejection fraction, by estimation, is 20 to 25%. The left ventricle has severely decreased function. The left ventricle demonstrates regional wall motion abnormalities.         All septal segments and the basal inferior segment appear akinetic. The rest of the LV walls appears severely hypokinetic. The left ventricular internal cavity size was moderately dilated. Diastolic function indeterminant due to mechanical mitral valve.  2. Right ventricular systolic function is normal. The right ventricular size is normal. There is moderately elevated pulmonary artery systolic pressure. The estimated right ventricular systolic pressure is 45.8 mmHg.  3. Left atrial size was severely dilated.  4. The mitral valve has been repaired/replaced. A mechanical St. Jude  mitral valve is present and appears well seated with normal function by doppler interrogation. Mean gradient at HR 86bpm. There is trivial mitral regurgitation.  5. The aortic valve is tricuspid. There is mild thickening of the aortic valve. Aortic valve regurgitation is not visualized. Mild aortic valve sclerosis is present, with no evidence of aortic valve stenosis.  6. The inferior vena cava is normal in size with greater than 50% respiratory variability, suggesting right atrial pressure of 3 mmHg. Comparison(s): No significant change from prior study. FINDINGS  Left Ventricle: Left ventricular ejection fraction, by estimation, is 20 to 25%. The left ventricle has severely decreased function. The left ventricle demonstrates regional wall motion abnormalities. All septal segments and the basal inferior segment appear akinetic. The rest of the LV walls appears severely hypokinetic. The left ventricular internal cavity size was moderately dilated. There is no left ventricular hypertrophy. Diastolic function indeterminant due to mechanical mitral valve. Right Ventricle: The right ventricular size is normal. No increase in right ventricular wall thickness. Right ventricular systolic function is normal. There is moderately elevated pulmonary artery systolic pressure. The tricuspid regurgitant velocity is 3.27 m/s, and with an assumed right atrial pressure of 3 mmHg, the estimated right ventricular systolic pressure is 45.8 mmHg. Left Atrium: Left atrial size was severely dilated. Right Atrium: Right atrial size was normal in size. Pericardium: Trivial pericardial effusion is present. Mitral Valve: A mechanical St. Jude mitral valve is present and appears well seated with normal function by doppler interrogation. Mean gradient at HR 86bpm. There is trivial mitral regurgitation. The mitral valve has been repaired/replaced. Trivial mitral valve regurgitation. There is a St. Jude mechanical valve present in the  mitral position. MV peak gradient, 6.6 mmHg. The mean mitral valve gradient is 5.0 mmHg. Tricuspid Valve: The tricuspid valve is normal in structure. Tricuspid valve regurgitation is mild. Aortic Valve: The aortic valve is tricuspid. There is mild thickening of the aortic valve. Aortic valve regurgitation is not visualized. Mild aortic valve sclerosis is present, with no evidence of aortic valve stenosis. Pulmonic Valve: The pulmonic valve was normal in structure. Pulmonic valve regurgitation is not visualized. Aorta: The aortic root and ascending aorta are structurally normal, with no evidence of dilitation. Venous: The inferior vena cava is normal in size with greater than 50% respiratory variability, suggesting right atrial pressure of 3 mmHg. IAS/Shunts: No atrial level shunt detected by color flow Doppler. Additional Comments: A device lead is visualized.  LEFT VENTRICLE PLAX 2D LVIDd:         5.30 cm LVIDs:         4.60 cm LV PW:         1.00  cm LV IVS:        0.80 cm LVOT diam:     1.70 cm LV SV:         36 LV SV Index:   20 LVOT Area:     2.27 cm  LV Volumes (MOD) LV vol d, MOD A2C: 111.0 ml LV vol d, MOD A4C: 92.4 ml LV vol s, MOD A2C: 60.1 ml LV vol s, MOD A4C: 62.1 ml LV SV MOD A2C:     50.9 ml LV SV MOD A4C:     92.4 ml LV SV MOD BP:      42.4 ml RIGHT VENTRICLE RV S prime:     9.73 cm/s TAPSE (M-mode): 2.0 cm LEFT ATRIUM              Index       RIGHT ATRIUM           Index LA diam:        2.80 cm  1.54 cm/m  RA Area:     12.50 cm LA Vol (A2C):   95.5 ml  52.66 ml/m RA Volume:   23.70 ml  13.07 ml/m LA Vol (A4C):   111.0 ml 61.21 ml/m LA Biplane Vol: 103.0 ml 56.80 ml/m  AORTIC VALVE LVOT Vmax:   109.12 cm/s LVOT Vmean:  66.625 cm/s LVOT VTI:    0.158 m  AORTA Ao Root diam: 2.70 cm Ao Asc diam:  2.90 cm MITRAL VALVE                TRICUSPID VALVE MV Area (PHT): 2.11 cm     TR Peak grad:   42.8 mmHg MV Area VTI:   1.26 cm     TR Vmax:        327.00 cm/s MV Peak grad:  6.6 mmHg MV Mean grad:  5.0  mmHg     SHUNTS MV Vmax:       1.28 m/s     Systemic VTI:  0.16 m MV Vmean:      111.0 cm/s   Systemic Diam: 1.70 cm MV Decel Time: 359 msec MR Peak grad: 110.7 mmHg MR Mean grad: 67.0 mmHg MR Vmax:      526.00 cm/s MR Vmean:     387.0 cm/s MV E velocity: 106.00 cm/s MV A velocity: 105.00 cm/s MV E/A ratio:  1.01 Laurance FlattenHeather Pemberton MD Electronically signed by Laurance FlattenHeather Pemberton MD Signature Date/Time: 07/22/2020/12:42:50 PM    Final      LOS: 3 days   Jeoffrey MassedShanker Dean Wonder, MD  [facility]    To contact the attending provider between 7A-7P or the covering provider during after hours 7P-7A, please log into the web site [email or web address] and access using universal [place] password for that web site. If you do not have the password, please call the hospital operator.  07/23/2020, 12:05 PM

## 2020-07-23 NOTE — Progress Notes (Signed)
Progress Note  Patient Name: Deborah [name] Date of Encounter: 07/23/2020  [facility] Cardiologist: Arvilla Meres, MD   Subjective   Quiet, no complaints.  Wondering when she is going to go home.  Explained to her the situation.  No bleeding.  No chest pain no stomach pain  Inpatient Medications    Scheduled Meds: . insulin aspart  0-6 Units Subcutaneous Q6H  . pantoprazole  40 mg Oral BID   Continuous Infusions: . lactated ringers     PRN Meds: acetaminophen, ondansetron (ZOFRAN) IV, polyethylene glycol   Vital Signs    Vitals:   07/22/20 2346 07/23/20 0346 07/23/20 0650 07/23/20 0728  BP: (!) 110/59 121/72  (!) 116/58  Pulse: 65 77  84  Resp: 18 15  17   Temp: 99 F (37.2 C) 97.7 F (36.5 C)  97.6 F (36.4 C)  TempSrc: Oral Oral  Oral  SpO2: 98% 99%  98%  Weight:   69.8 kg   Height:        Intake/Output Summary (Last 24 hours) at 07/23/2020 0954 Last data filed at 07/23/2020 0506 Gross per 24 hour  Intake 655.98 ml  Output 600 ml  Net 55.98 ml   Last 3 Weights 07/23/2020 07/20/2020 02/01/2019  Weight (lbs) 153 lb 14.1 oz 171 lb 15.3 oz 171 lb  Weight (kg) 69.8 kg 78 kg 77.565 kg      Telemetry    Sinus rhythm with PACs, PVCs- Personally Reviewed  ECG    No new- Personally Reviewed  Physical Exam   GEN: No acute distress.   Neck: No JVD Cardiac: RRR occasional ectopy, no murmurs, rubs, or gallops.  Respiratory: Clear to auscultation bilaterally. GI: Soft, nontender, non-distended  MS: No edema; No deformity. Neuro:  Nonfocal  Psych: Normal affect   Labs    High Sensitivity Troponin:  No results for input(s): TROPONINIHS in the last 720 hours.    Chemistry Recent Labs  Lab 07/20/20 1831 07/21/20 0929 07/22/20 0630 07/23/20 0058  NA 139 144 144 141  K 4.2 4.2 4.3 4.0  CL 108 114* 112* 111  CO2 24 23 28 27   GLUCOSE 271* 188* 112* 108*  BUN 77* 64* 52* 50*  CREATININE 1.88* 1.60* 1.29* 1.33*  CALCIUM 8.4* 8.2* 8.4* 8.0*  PROT  5.4* 5.0*  --   --   ALBUMIN 3.1* 3.0*  --   --   AST 14* 16  --   --   ALT 15 13  --   --   ALKPHOS 38 35*  --   --   BILITOT 0.5 0.7  --   --   GFRNONAA 27* 33* 43* 41*  ANIONGAP 7 7 4* 3*     Hematology Recent Labs  Lab 07/21/20 1832 07/22/20 0630 07/23/20 0058  WBC 10.1 8.9 8.6  RBC 3.47* 3.51* 2.78*  HGB 10.7* 10.8* 8.8*  HCT 32.0* 32.9* 26.5*  MCV 92.2 93.7 95.3  MCH 30.8 30.8 31.7  MCHC 33.4 32.8 33.2  RDW 16.4* 16.4* 15.9*  PLT 113* 105* 107*    BNPNo results for input(s): BNP, PROBNP in the last 168 hours.   DDimer No results for input(s): DDIMER in the last 168 hours.   Radiology    ECHOCARDIOGRAM COMPLETE  Result Date: 07/22/2020    ECHOCARDIOGRAM REPORT   Patient Name:   ATHIRA JANOWICZ Date of Exam: 07/22/2020 Medical Rec #:  Armando Reichert      Height:       63.0  in Accession #:    [id number]     Weight:       172.0 lb Date of Birth:  13-Jun-1945       BSA:          1.813 m Patient Age:    75 years       BP:           99/74 mmHg Patient Gender: F              HR:           91 bpm. Exam Location:  Inpatient Procedure: 2D Echo, Cardiac Doppler and Color Doppler Indications:    CHF-Acute Systolic I50.21  History:        Patient has prior history of Echocardiogram examinations, most                 recent 06/18/2018. CHF; Risk Factors:Hypertension, Dyslipidemia                 and Diabetes.  Sonographer:    Eulah Pont RDCS Referring Phys: 24 RHONDA G BARRETT IMPRESSIONS  1. Left ventricular ejection fraction, by estimation, is 20 to 25%. The left ventricle has severely decreased function. The left ventricle demonstrates regional wall motion abnormalities.         All septal segments and the basal inferior segment appear akinetic. The rest of the LV walls appears severely hypokinetic. The left ventricular internal cavity size was moderately dilated. Diastolic function indeterminant due to mechanical mitral valve.  2. Right ventricular systolic function is normal. The right  ventricular size is normal. There is moderately elevated pulmonary artery systolic pressure. The estimated right ventricular systolic pressure is 45.8 mmHg.  3. Left atrial size was severely dilated.  4. The mitral valve has been repaired/replaced. A mechanical St. Jude mitral valve is present and appears well seated with normal function by doppler interrogation. Mean gradient at HR 86bpm. There is trivial mitral regurgitation.  5. The aortic valve is tricuspid. There is mild thickening of the aortic valve. Aortic valve regurgitation is not visualized. Mild aortic valve sclerosis is present, with no evidence of aortic valve stenosis.  6. The inferior vena cava is normal in size with greater than 50% respiratory variability, suggesting right atrial pressure of 3 mmHg. Comparison(s): No significant change from prior study. FINDINGS  Left Ventricle: Left ventricular ejection fraction, by estimation, is 20 to 25%. The left ventricle has severely decreased function. The left ventricle demonstrates regional wall motion abnormalities. All septal segments and the basal inferior segment appear akinetic. The rest of the LV walls appears severely hypokinetic. The left ventricular internal cavity size was moderately dilated. There is no left ventricular hypertrophy. Diastolic function indeterminant due to mechanical mitral valve. Right Ventricle: The right ventricular size is normal. No increase in right ventricular wall thickness. Right ventricular systolic function is normal. There is moderately elevated pulmonary artery systolic pressure. The tricuspid regurgitant velocity is 3.27 m/s, and with an assumed right atrial pressure of 3 mmHg, the estimated right ventricular systolic pressure is 45.8 mmHg. Left Atrium: Left atrial size was severely dilated. Right Atrium: Right atrial size was normal in size. Pericardium: Trivial pericardial effusion is present. Mitral Valve: A mechanical St. Jude mitral valve is present and  appears well seated with normal function by doppler interrogation. Mean gradient at HR 86bpm. There is trivial mitral regurgitation. The mitral valve has been repaired/replaced. Trivial mitral valve regurgitation. There is a St. Jude mechanical valve present  in the mitral position. MV peak gradient, 6.6 mmHg. The mean mitral valve gradient is 5.0 mmHg. Tricuspid Valve: The tricuspid valve is normal in structure. Tricuspid valve regurgitation is mild. Aortic Valve: The aortic valve is tricuspid. There is mild thickening of the aortic valve. Aortic valve regurgitation is not visualized. Mild aortic valve sclerosis is present, with no evidence of aortic valve stenosis. Pulmonic Valve: The pulmonic valve was normal in structure. Pulmonic valve regurgitation is not visualized. Aorta: The aortic root and ascending aorta are structurally normal, with no evidence of dilitation. Venous: The inferior vena cava is normal in size with greater than 50% respiratory variability, suggesting right atrial pressure of 3 mmHg. IAS/Shunts: No atrial level shunt detected by color flow Doppler. Additional Comments: A device lead is visualized.  LEFT VENTRICLE PLAX 2D LVIDd:         5.30 cm LVIDs:         4.60 cm LV PW:         1.00 cm LV IVS:        0.80 cm LVOT diam:     1.70 cm LV SV:         36 LV SV Index:   20 LVOT Area:     2.27 cm  LV Volumes (MOD) LV vol d, MOD A2C: 111.0 ml LV vol d, MOD A4C: 92.4 ml LV vol s, MOD A2C: 60.1 ml LV vol s, MOD A4C: 62.1 ml LV SV MOD A2C:     50.9 ml LV SV MOD A4C:     92.4 ml LV SV MOD BP:      42.4 ml RIGHT VENTRICLE RV S prime:     9.73 cm/s TAPSE (M-mode): 2.0 cm LEFT ATRIUM              Index       RIGHT ATRIUM           Index LA diam:        2.80 cm  1.54 cm/m  RA Area:     12.50 cm LA Vol (A2C):   95.5 ml  52.66 ml/m RA Volume:   23.70 ml  13.07 ml/m LA Vol (A4C):   111.0 ml 61.21 ml/m LA Biplane Vol: 103.0 ml 56.80 ml/m  AORTIC VALVE LVOT Vmax:   109.12 cm/s LVOT Vmean:  66.625  cm/s LVOT VTI:    0.158 m  AORTA Ao Root diam: 2.70 cm Ao Asc diam:  2.90 cm MITRAL VALVE                TRICUSPID VALVE MV Area (PHT): 2.11 cm     TR Peak grad:   42.8 mmHg MV Area VTI:   1.26 cm     TR Vmax:        327.00 cm/s MV Peak grad:  6.6 mmHg MV Mean grad:  5.0 mmHg     SHUNTS MV Vmax:       1.28 m/s     Systemic VTI:  0.16 m MV Vmean:      111.0 cm/s   Systemic Diam: 1.70 cm MV Decel Time: 359 msec MR Peak grad: 110.7 mmHg MR Mean grad: 67.0 mmHg MR Vmax:      526.00 cm/s MR Vmean:     387.0 cm/s MV E velocity: 106.00 cm/s MV A velocity: 105.00 cm/s MV E/A ratio:  1.01 Laurance Flatten MD Electronically signed by Laurance Flatten MD Signature Date/Time: 07/22/2020/12:42:50 PM    Final     Cardiac  Studies   ECHO this admit   1. Left ventricular ejection fraction, by estimation, is 20 to 25%. The  left ventricle has severely decreased function. The left ventricle  demonstrates regional wall motion abnormalities.      All septal segments and the basal inferior segment appear akinetic.  The rest of the LV walls appears severely hypokinetic. The left  ventricular internal cavity size was moderately dilated. Diastolic  function indeterminant due to mechanical mitral  valve.  2. Right ventricular systolic function is normal. The right ventricular  size is normal. There is moderately elevated pulmonary artery systolic  pressure. The estimated right ventricular systolic pressure is 45.8 mmHg.  3. Left atrial size was severely dilated.  4. The mitral valve has been repaired/replaced. A mechanical St. Jude  mitral valve is present and appears well seated with normal function by  doppler interrogation. Mean gradient at HR 86bpm. There is trivial  mitral regurgitation.  5. The aortic valve is tricuspid. There is mild thickening of the aortic  valve. Aortic valve regurgitation is not visualized. Mild aortic valve  sclerosis is present, with no evidence of aortic valve  stenosis.  6. The inferior vena cava is normal in size with greater than 50%  respiratory variability, suggesting right atrial pressure of 3 mmHg.   Comparison(s): No significant change from prior study.   Patient Profile     75 y.o. female with GI bleed, mechanical mitral valve, EF 20%  Assessment & Plan    Mechanical mitral valve on chronic anticoagulation - Holding Coumadin. -INR 4.0 today down from 7 range -Continue to allow to drift down naturally without vitamin K for fear of mechanical valve thrombosis. - Once INR less than 2.5, would initiate heparin IV per pharmacy without bolus to continue to protect valve.   -Continue to monitor closely for bleeding surveillance. -Hemoglobin stable 8.8.  Chronic systolic heart failure - EF 20%, creatinine 1.33.  Stable.  Holding medications at this point.  Does not appear to be volume overloaded.  Comfortable.    For questions or updates, please contact [facility] Please consult [email or web address] for contact info under        Signed, Donato Schultz, MD  07/23/2020, 9:54 AM

## 2020-07-23 NOTE — Progress Notes (Signed)
Daily Rounding Note  07/23/2020, 10:48 AM  LOS: 3 days   SUBJECTIVE:   Chief complaint:   N/V.  FOBT positive.  Iatrogenic coagulopathy. No black or bloody stools.  No N/V.  Tolerating solid diet, eating  75% of trays served.    OBJECTIVE:         Vital signs in last 24 hours:    Temp:  [97.6 F (36.4 C)-99.3 F (37.4 C)] 97.6 F (36.4 C) (04/14 0728) Pulse Rate:  [65-91] 84 (04/14 0728) Resp:  [15-20] 17 (04/14 0728) BP: (101-121)/(58-72) 116/58 (04/14 0728) SpO2:  [94 %-99 %] 98 % (04/14 0728) Weight:  [69.8 kg] 69.8 kg (04/14 0650) Last BM Date: 07/21/20 Filed Weights   07/20/20 1830 07/23/20 0650  Weight: 78 kg 69.8 kg   Did not re-examine.    Intake/Output from previous day: 04/13 0701 - 04/14 0700 In: 896 [P.O.:720; I.V.:176] Out: 600 [Urine:600]  Intake/Output this shift: No intake/output data recorded.  Lab Results: Recent Labs    07/21/20 1832 07/22/20 0630 07/23/20 0058  WBC 10.1 8.9 8.6  HGB 10.7* 10.8* 8.8*  HCT 32.0* 32.9* 26.5*  PLT 113* 105* 107*   BMET Recent Labs    07/21/20 0929 07/22/20 0630 07/23/20 0058  NA 144 144 141  K 4.2 4.3 4.0  CL 114* 112* 111  CO2 23 28 27   GLUCOSE 188* 112* 108*  BUN 64* 52* 50*  CREATININE 1.60* 1.29* 1.33*  CALCIUM 8.2* 8.4* 8.0*   LFT Recent Labs    07/20/20 1831 07/21/20 0929  PROT 5.4* 5.0*  ALBUMIN 3.1* 3.0*  AST 14* 16  ALT 15 13  ALKPHOS 38 35*  BILITOT 0.5 0.7   PT/INR Recent Labs    07/22/20 0630 07/23/20 0058  LABPROT 59.9* 38.6*  INR 6.9* 4.0*   Hepatitis Panel No results for input(s): HEPBSAG, HCVAB, HEPAIGM, HEPBIGM in the last 72 hours.  Studies/Results: ECHOCARDIOGRAM COMPLETE  Result Date: 07/22/2020 IMPRESSIONS  1. Left ventricular ejection fraction, by estimation, is 20 to 25%. The left ventricle has severely decreased function. The left ventricle demonstrates regional wall motion abnormalities.          All septal segments and the basal inferior segment appear akinetic. The rest of the LV walls appears severely hypokinetic. The left ventricular internal cavity size was moderately dilated. Diastolic function indeterminant due to mechanical mitral valve.  2. Right ventricular systolic function is normal. The right ventricular size is normal. There is moderately elevated pulmonary artery systolic pressure. The estimated right ventricular systolic pressure is 45.8 mmHg.  3. Left atrial size was severely dilated.  4. The mitral valve has been repaired/replaced. A mechanical St. Jude mitral valve is present and appears well seated with normal function by doppler interrogation. Mean gradient 07/24/2020 at HR 86bpm. There is trivial mitral regurgitation.  5. The aortic valve is tricuspid. There is mild thickening of the aortic valve. Aortic valve regurgitation is not visualized. Mild aortic valve sclerosis is present, with no evidence of aortic valve stenosis.  6. The inferior vena cava is normal in size with greater than 50% respiratory variability, suggesting right atrial pressure of 3 mmHg. Comparison(s): No significant change from prior study.  Electronically signed by MD Signature Date/Time: 07/22/2020/12:42:50 PM    Final    Scheduled Meds: . insulin aspart  0-6 Units Subcutaneous Q6H  . pantoprazole  40 mg Oral BID   Continuous Infusions: . lactated ringers  PRN Meds:.acetaminophen, ondansetron (ZOFRAN) IV, polyethylene glycol   ASSESMENT:   *Nonbloody N/V but black, liquid, FOBT+stool on DRE. No stools or nausea vomiting since admission. Suspect recurrent ulcer disease in pt not taking any gastric acid suppressing medications at home.. 11/2014 upper GI bleed. EGD then revealed nonbleeding gastric and duodenal ulcers. H. pylori positive then, ?was this ever treated?Not on any gastric acid suppressing medications currently.Protonix day 3: drip >> po bid.    *Iatrogenic  coagulopathy. Chronic Coumadin for mechanical MVR, on hold. INR 5.6 >> 7.6 >> 6.9 >> 4.    Cardiology prefers to allow the INR to drift naturally.  *Blood loss anemia. Perhaps anemia of chronic disease as well. Hgb 6.9 >> 12.3 >> 10.8 >> 8.8.  4 PRBCs thus far, last early AM 4/12.   Iron, TIBC, ferritin, B12, folate WNL. Elevated iron sats.   *    Thrombocytopenia.  Platelets 107K.  *AKI.Hxstage 3aCKD.    *   Systolic CHF.  LVEF 20 to 25%.   PLAN   *  Achieve goal INR of ideally 1.5 but less than 2 would be okay for proceeding with EGD. We will keep watch on labs and return to see pt and arrange EGD when goal INR achieved.      Jennye Moccasin  07/23/2020, 10:48 AM Phone [phone]

## 2020-07-24 ENCOUNTER — Inpatient Hospital Stay (HOSPITAL_COMMUNITY): Payer: Medicare HMO | Admitting: Anesthesiology

## 2020-07-24 ENCOUNTER — Encounter (HOSPITAL_COMMUNITY): Payer: Self-pay | Admitting: Internal Medicine

## 2020-07-24 ENCOUNTER — Encounter (HOSPITAL_COMMUNITY)
Admission: EM | Disposition: A | Discharge status: Home Health | Payer: Self-pay | Source: Home / Self Care | Attending: Internal Medicine

## 2020-07-24 DIAGNOSIS — K2980 Duodenitis without bleeding: Secondary | ICD-10-CM

## 2020-07-24 DIAGNOSIS — K259 Gastric ulcer, unspecified as acute or chronic, without hemorrhage or perforation: Secondary | ICD-10-CM

## 2020-07-24 DIAGNOSIS — K269 Duodenal ulcer, unspecified as acute or chronic, without hemorrhage or perforation: Secondary | ICD-10-CM

## 2020-07-24 DIAGNOSIS — K253 Acute gastric ulcer without hemorrhage or perforation: Secondary | ICD-10-CM

## 2020-07-24 HISTORY — PX: ESOPHAGOGASTRODUODENOSCOPY (EGD) WITH PROPOFOL: SHX5813

## 2020-07-24 HISTORY — PX: BIOPSY: SHX5522

## 2020-07-24 LAB — HEMOGLOBIN AND HEMATOCRIT, BLOOD
HCT: 27.5 % — ABNORMAL LOW (ref 36.0–46.0)
Hemoglobin: 9 g/dL — ABNORMAL LOW (ref 12.0–15.0)

## 2020-07-24 LAB — CBC
HCT: 22.8 % — ABNORMAL LOW (ref 36.0–46.0)
Hemoglobin: 7.5 g/dL — ABNORMAL LOW (ref 12.0–15.0)
MCH: 31.6 pg (ref 26.0–34.0)
MCHC: 32.9 g/dL (ref 30.0–36.0)
MCV: 96.2 fL (ref 80.0–100.0)
Platelets: 120 10*3/uL — ABNORMAL LOW (ref 150–400)
RBC: 2.37 MIL/uL — ABNORMAL LOW (ref 3.87–5.11)
RDW: 15.6 % — ABNORMAL HIGH (ref 11.5–15.5)
WBC: 9.7 10*3/uL (ref 4.0–10.5)
nRBC: 0 % (ref 0.0–0.2)

## 2020-07-24 LAB — BASIC METABOLIC PANEL
Anion gap: 0 — ABNORMAL LOW (ref 5–15)
BUN: 49 mg/dL — ABNORMAL HIGH (ref 8–23)
CO2: 28 mmol/L (ref 22–32)
Calcium: 7.9 mg/dL — ABNORMAL LOW (ref 8.9–10.3)
Chloride: 110 mmol/L (ref 98–111)
Creatinine, Ser: 1.35 mg/dL — ABNORMAL HIGH (ref 0.44–1.00)
GFR, Estimated: 41 mL/min — ABNORMAL LOW (ref >60)
Glucose, Bld: 106 mg/dL — ABNORMAL HIGH (ref 70–99)
Potassium: 4.2 mmol/L (ref 3.5–5.1)
Sodium: 138 mmol/L (ref 135–145)

## 2020-07-24 LAB — MRSA PCR SCREENING: MRSA by PCR: NEGATIVE

## 2020-07-24 LAB — GLUCOSE, CAPILLARY
Glucose-Capillary: 120 mg/dL — ABNORMAL HIGH (ref 70–99)
Glucose-Capillary: 105 mg/dL — ABNORMAL HIGH (ref 70–99)
Glucose-Capillary: 108 mg/dL — ABNORMAL HIGH (ref 70–99)
Glucose-Capillary: 175 mg/dL — ABNORMAL HIGH (ref 70–99)
Glucose-Capillary: 113 mg/dL — ABNORMAL HIGH (ref 70–99)

## 2020-07-24 LAB — PROTIME-INR
INR: 2 — ABNORMAL HIGH (ref 0.8–1.2)
INR: 1.4 — ABNORMAL HIGH (ref 0.8–1.2)
Prothrombin Time: 22.2 seconds — ABNORMAL HIGH (ref 11.4–15.2)
Prothrombin Time: 17.1 seconds — ABNORMAL HIGH (ref 11.4–15.2)

## 2020-07-24 LAB — HEPARIN LEVEL (UNFRACTIONATED): Heparin Unfractionated: 0.73 IU/mL — ABNORMAL HIGH (ref 0.30–0.70)

## 2020-07-24 LAB — PREPARE RBC (CROSSMATCH)

## 2020-07-24 LAB — APTT: aPTT: 132 seconds — ABNORMAL HIGH (ref 24–36)

## 2020-07-24 SURGERY — ESOPHAGOGASTRODUODENOSCOPY (EGD) WITH PROPOFOL
Anesthesia: Monitor Anesthesia Care

## 2020-07-24 MED ORDER — ACETAMINOPHEN 325 MG PO TABS
650.0000 mg | ORAL_TABLET | Freq: Once | ORAL | Status: AC
  Administered 2020-07-24: 650 mg via ORAL
  Filled 2020-07-24: qty 2

## 2020-07-24 MED ORDER — SODIUM CHLORIDE 0.9% IV SOLUTION: Freq: Once | INTRAVENOUS | Status: DC

## 2020-07-24 MED ORDER — PHENYLEPHRINE 40 MCG/ML (10ML) SYRINGE FOR IV PUSH (FOR BLOOD PRESSURE SUPPORT)
PREFILLED_SYRINGE | INTRAVENOUS | Status: DC | PRN
  Administered 2020-07-24: 80 ug via INTRAVENOUS
  Administered 2020-07-24: 120 ug via INTRAVENOUS

## 2020-07-24 MED ORDER — LACTATED RINGERS IV SOLN: INTRAVENOUS | Status: DC

## 2020-07-24 MED ORDER — PHENYLEPHRINE HCL-NACL 10-0.9 MG/250ML-% IV SOLN
INTRAVENOUS | Status: DC | PRN
  Administered 2020-07-24: 50 ug/min via INTRAVENOUS

## 2020-07-24 MED ORDER — HEPARIN (PORCINE) 25000 UT/250ML-% IV SOLN
700.0000 [IU]/h | INTRAVENOUS | Status: DC
  Administered 2020-07-24: 1000 [IU]/h via INTRAVENOUS
  Filled 2020-07-24: qty 250

## 2020-07-24 MED ORDER — PROPOFOL 10 MG/ML IV BOLUS
INTRAVENOUS | Status: DC | PRN
  Administered 2020-07-24: 20 mg via INTRAVENOUS

## 2020-07-24 MED ORDER — PROPOFOL 500 MG/50ML IV EMUL
INTRAVENOUS | Status: DC | PRN
  Administered 2020-07-24: 75 ug/kg/min via INTRAVENOUS

## 2020-07-24 SURGICAL SUPPLY — 15 items

## 2020-07-24 NOTE — H&P (View-Only) (Signed)
Patient ID: Deborah [name], female   DOB: 10-Jun-1945, 75 y.o.   MRN: [id number]    Progress Note   Subjective   Day # 3  CC; heme positive, anemia  INR 2.0/pro time 22.2 Hemoglobin 7.5/hematocrit 22.8  Being transfused this a.m. Cardiology starting heparin as INR 2. Asked by cardiology to proceed with EGD today  Echo/EF 20 to 25%  Patient says she has not had any further nausea or vomiting, understands she is to have a EGD this a.m.   Objective   Vital signs in last 24 hours: Temp:  [97.6 F (36.4 C)-99.2 F (37.3 C)] 98.5 F (36.9 C) (04/15 0737) Pulse Rate:  [80-101] 85 (04/15 0737) Resp:  [14-22] 20 (04/15 0737) BP: (91-124)/(53-102) 105/61 (04/15 0737) SpO2:  [93 %-98 %] 98 % (04/15 0737) Weight:  [67.3 kg] 67.3 kg (04/15 0500) Last BM Date: 07/21/20 General:    Elderly African-American female in NAD Heart:  Regular rate and rhythm; clinical valve clicks Lungs: Respirations even and unlabored, lungs CTA bilaterally Abdomen:  Soft, nontender and nondistended. Normal bowel sounds. Extremities:  Without edema. Neurologic:  Alert and oriented,  grossly normal neurologically. Psych:  Cooperative. Normal mood and affect.  Intake/Output from previous day: 04/14 0701 - 04/15 0700 In: 480 [P.O.:480] Out: 500 [Urine:500] Intake/Output this shift: No intake/output data recorded.  Lab Results: Recent Labs    07/22/20 0630 07/23/20 0058 07/24/20 0034  WBC 8.9 8.6 9.7  HGB 10.8* 8.8* 7.5*  HCT 32.9* 26.5* 22.8*  PLT 105* 107* 120*   BMET Recent Labs    07/22/20 0630 07/23/20 0058 07/24/20 0034  NA 144 141 138  K 4.3 4.0 4.2  CL 112* 111 110  CO2 28 27 28   GLUCOSE 112* 108* 106*  BUN 52* 50* 49*  CREATININE 1.29* 1.33* 1.35*  CALCIUM 8.4* 8.0* 7.9*   LFT Recent Labs    07/21/20 0929  PROT 5.0*  ALBUMIN 3.0*  AST 16  ALT 13  ALKPHOS 35*  BILITOT 0.7   PT/INR Recent Labs    07/23/20 0058 07/24/20 0034  LABPROT 38.6* 22.2*  INR 4.0* 2.0*     Studies/Results: ECHOCARDIOGRAM COMPLETE  Result Date: 07/22/2020    ECHOCARDIOGRAM REPORT   Patient Name:   Deborah [name] Date of Exam: 07/22/2020 Medical Rec #:  07/24/2020      Height:       63.0 in Accession #:    [id number]     Weight:       172.0 lb Date of Birth:  1946-01-17       BSA:          1.813 m Patient Age:    74 years       BP:           99/74 mmHg Patient Gender: F              HR:           91 bpm. Exam Location:  Inpatient Procedure: 2D Echo, Cardiac Doppler and Color Doppler Indications:    CHF-Acute Systolic I50.21  History:        Patient has prior history of Echocardiogram examinations, most                 recent 06/18/2018. CHF; Risk Factors:Hypertension, Dyslipidemia                 and Diabetes.  Sonographer:    08/18/2018 RDCS Referring Phys: 2 RHONDA G  BARRETT IMPRESSIONS  1. Left ventricular ejection fraction, by estimation, is 20 to 25%. The left ventricle has severely decreased function. The left ventricle demonstrates regional wall motion abnormalities.         All septal segments and the basal inferior segment appear akinetic. The rest of the LV walls appears severely hypokinetic. The left ventricular internal cavity size was moderately dilated. Diastolic function indeterminant due to mechanical mitral valve.  2. Right ventricular systolic function is normal. The right ventricular size is normal. There is moderately elevated pulmonary artery systolic pressure. The estimated right ventricular systolic pressure is 45.8 mmHg.  3. Left atrial size was severely dilated.  4. The mitral valve has been repaired/replaced. A mechanical St. Jude mitral valve is present and appears well seated with normal function by doppler interrogation. Mean gradient at HR 86bpm. There is trivial mitral regurgitation.  5. The aortic valve is tricuspid. There is mild thickening of the aortic valve. Aortic valve regurgitation is not visualized. Mild aortic valve sclerosis is present, with no  evidence of aortic valve stenosis.  6. The inferior vena cava is normal in size with greater than 50% respiratory variability, suggesting right atrial pressure of 3 mmHg. Comparison(s): No significant change from prior study. FINDINGS  Left Ventricle: Left ventricular ejection fraction, by estimation, is 20 to 25%. The left ventricle has severely decreased function. The left ventricle demonstrates regional wall motion abnormalities. All septal segments and the basal inferior segment appear akinetic. The rest of the LV walls appears severely hypokinetic. The left ventricular internal cavity size was moderately dilated. There is no left ventricular hypertrophy. Diastolic function indeterminant due to mechanical mitral valve. Right Ventricle: The right ventricular size is normal. No increase in right ventricular wall thickness. Right ventricular systolic function is normal. There is moderately elevated pulmonary artery systolic pressure. The tricuspid regurgitant velocity is 3.27 m/s, and with an assumed right atrial pressure of 3 mmHg, the estimated right ventricular systolic pressure is 45.8 mmHg. Left Atrium: Left atrial size was severely dilated. Right Atrium: Right atrial size was normal in size. Pericardium: Trivial pericardial effusion is present. Mitral Valve: A mechanical St. Jude mitral valve is present and appears well seated with normal function by doppler interrogation. Mean gradient at HR 86bpm. There is trivial mitral regurgitation. The mitral valve has been repaired/replaced. Trivial mitral valve regurgitation. There is a St. Jude mechanical valve present in the mitral position. MV peak gradient, 6.6 mmHg. The mean mitral valve gradient is 5.0 mmHg. Tricuspid Valve: The tricuspid valve is normal in structure. Tricuspid valve regurgitation is mild. Aortic Valve: The aortic valve is tricuspid. There is mild thickening of the aortic valve. Aortic valve regurgitation is not visualized. Mild aortic  valve sclerosis is present, with no evidence of aortic valve stenosis. Pulmonic Valve: The pulmonic valve was normal in structure. Pulmonic valve regurgitation is not visualized. Aorta: The aortic root and ascending aorta are structurally normal, with no evidence of dilitation. Venous: The inferior vena cava is normal in size with greater than 50% respiratory variability, suggesting right atrial pressure of 3 mmHg. IAS/Shunts: No atrial level shunt detected by color flow Doppler. Additional Comments: A device lead is visualized.  LEFT VENTRICLE PLAX 2D LVIDd:         5.30 cm LVIDs:         4.60 cm LV PW:         1.00 cm LV IVS:        0.80 cm LVOT diam:  1.70 cm LV SV:         36 LV SV Index:   20 LVOT Area:     2.27 cm  LV Volumes (MOD) LV vol d, MOD A2C: 111.0 ml LV vol d, MOD A4C: 92.4 ml LV vol s, MOD A2C: 60.1 ml LV vol s, MOD A4C: 62.1 ml LV SV MOD A2C:     50.9 ml LV SV MOD A4C:     92.4 ml LV SV MOD BP:      42.4 ml RIGHT VENTRICLE RV S prime:     9.73 cm/s TAPSE (M-mode): 2.0 cm LEFT ATRIUM              Index       RIGHT ATRIUM           Index LA diam:        2.80 cm  1.54 cm/m  RA Area:     12.50 cm LA Vol (A2C):   95.5 ml  52.66 ml/m RA Volume:   23.70 ml  13.07 ml/m LA Vol (A4C):   111.0 ml 61.21 ml/m LA Biplane Vol: 103.0 ml 56.80 ml/m  AORTIC VALVE LVOT Vmax:   109.12 cm/s LVOT Vmean:  66.625 cm/s LVOT VTI:    0.158 m  AORTA Ao Root diam: 2.70 cm Ao Asc diam:  2.90 cm MITRAL VALVE                TRICUSPID VALVE MV Area (PHT): 2.11 cm     TR Peak grad:   42.8 mmHg MV Area VTI:   1.26 cm     TR Vmax:        327.00 cm/s MV Peak grad:  6.6 mmHg MV Mean grad:  5.0 mmHg     SHUNTS MV Vmax:       1.28 m/s     Systemic VTI:  0.16 m MV Vmean:      111.0 cm/s   Systemic Diam: 1.70 cm MV Decel Time: 359 msec MR Peak grad: 110.7 mmHg MR Mean grad: 67.0 mmHg MR Vmax:      526.00 cm/s MR Vmean:     387.0 cm/s MV E velocity: 106.00 cm/s MV A velocity: 105.00 cm/s MV E/A ratio:  1.01 Laurance Flatten MD  Electronically signed by Laurance Flatten MD Signature Date/Time: 07/22/2020/12:42:50 PM    Final        Assessment / Plan:    #56 75 year old African-American female admitted with GI bleeding in setting of supratherapeutic INR.  Coumadin has been held and INR today down to 2 Heparin to be started today No active bleeding but hemoglobin has drifted, to be transfused 1 unit today  #2 status post mitral valve replacement, #3 CHF-EF 20%  Plan; heparin will be held this morning Patient has been scheduled for EGD with Dr. Rhea Belton this a.m.  Procedure was discussed again with patient this morning in detail including indications risk and benefits and she is agreeable to proceed   Will resume heparin post EGD    Principal Problem:   Acute upper GI bleed Active Problems:   Mitral valve disorder. (Status post mechanical valve replacement)   SYSTOLIC HEART FAILURE, CHRONIC   Long term (current) use of anticoagulants   Acute on chronic anemia   AKI (acute kidney injury) (HCC)   Lactic acidosis   Warfarin-induced coagulopathy (HCC)     LOS: 4 days   Rosellen Lichtenberger  07/24/2020, 8:33 AM

## 2020-07-24 NOTE — Progress Notes (Signed)
Patient ID: TERA PELLICANE, female   DOB: 10-Jun-1945, 75 y.o.   MRN: [id number]    Progress Note   Subjective   Day # 3  CC; heme positive, anemia  INR 2.0/pro time 22.2 Hemoglobin 7.5/hematocrit 22.8  Being transfused this a.m. Cardiology starting heparin as INR 2. Asked by cardiology to proceed with EGD today  Echo/EF 20 to 25%  Patient says she has not had any further nausea or vomiting, understands she is to have a EGD this a.m.   Objective   Vital signs in last 24 hours: Temp:  [97.6 F (36.4 C)-99.2 F (37.3 C)] 98.5 F (36.9 C) (04/15 0737) Pulse Rate:  [80-101] 85 (04/15 0737) Resp:  [14-22] 20 (04/15 0737) BP: (91-124)/(53-102) 105/61 (04/15 0737) SpO2:  [93 %-98 %] 98 % (04/15 0737) Weight:  [67.3 kg] 67.3 kg (04/15 0500) Last BM Date: 07/21/20 General:    Elderly African-American female in NAD Heart:  Regular rate and rhythm; clinical valve clicks Lungs: Respirations even and unlabored, lungs CTA bilaterally Abdomen:  Soft, nontender and nondistended. Normal bowel sounds. Extremities:  Without edema. Neurologic:  Alert and oriented,  grossly normal neurologically. Psych:  Cooperative. Normal mood and affect.  Intake/Output from previous day: 04/14 0701 - 04/15 0700 In: 480 [P.O.:480] Out: 500 [Urine:500] Intake/Output this shift: No intake/output data recorded.  Lab Results: Recent Labs    07/22/20 0630 07/23/20 0058 07/24/20 0034  WBC 8.9 8.6 9.7  HGB 10.8* 8.8* 7.5*  HCT 32.9* 26.5* 22.8*  PLT 105* 107* 120*   BMET Recent Labs    07/22/20 0630 07/23/20 0058 07/24/20 0034  NA 144 141 138  K 4.3 4.0 4.2  CL 112* 111 110  CO2 28 27 28   GLUCOSE 112* 108* 106*  BUN 52* 50* 49*  CREATININE 1.29* 1.33* 1.35*  CALCIUM 8.4* 8.0* 7.9*   LFT Recent Labs    07/21/20 0929  PROT 5.0*  ALBUMIN 3.0*  AST 16  ALT 13  ALKPHOS 35*  BILITOT 0.7   PT/INR Recent Labs    07/23/20 0058 07/24/20 0034  LABPROT 38.6* 22.2*  INR 4.0* 2.0*     Studies/Results: ECHOCARDIOGRAM COMPLETE  Result Date: 07/22/2020    ECHOCARDIOGRAM REPORT   Patient Name:   CAILAH REACH Date of Exam: 07/22/2020 Medical Rec #:  07/24/2020      Height:       63.0 in Accession #:    [id number]     Weight:       172.0 lb Date of Birth:  1946-01-17       BSA:          1.813 m Patient Age:    74 years       BP:           99/74 mmHg Patient Gender: F              HR:           91 bpm. Exam Location:  Inpatient Procedure: 2D Echo, Cardiac Doppler and Color Doppler Indications:    CHF-Acute Systolic I50.21  History:        Patient has prior history of Echocardiogram examinations, most                 recent 06/18/2018. CHF; Risk Factors:Hypertension, Dyslipidemia                 and Diabetes.  Sonographer:    08/18/2018 RDCS Referring Phys: 2 RHONDA G  BARRETT IMPRESSIONS  1. Left ventricular ejection fraction, by estimation, is 20 to 25%. The left ventricle has severely decreased function. The left ventricle demonstrates regional wall motion abnormalities.         All septal segments and the basal inferior segment appear akinetic. The rest of the LV walls appears severely hypokinetic. The left ventricular internal cavity size was moderately dilated. Diastolic function indeterminant due to mechanical mitral valve.  2. Right ventricular systolic function is normal. The right ventricular size is normal. There is moderately elevated pulmonary artery systolic pressure. The estimated right ventricular systolic pressure is 45.8 mmHg.  3. Left atrial size was severely dilated.  4. The mitral valve has been repaired/replaced. A mechanical St. Jude mitral valve is present and appears well seated with normal function by doppler interrogation. Mean gradient at HR 86bpm. There is trivial mitral regurgitation.  5. The aortic valve is tricuspid. There is mild thickening of the aortic valve. Aortic valve regurgitation is not visualized. Mild aortic valve sclerosis is present, with no  evidence of aortic valve stenosis.  6. The inferior vena cava is normal in size with greater than 50% respiratory variability, suggesting right atrial pressure of 3 mmHg. Comparison(s): No significant change from prior study. FINDINGS  Left Ventricle: Left ventricular ejection fraction, by estimation, is 20 to 25%. The left ventricle has severely decreased function. The left ventricle demonstrates regional wall motion abnormalities. All septal segments and the basal inferior segment appear akinetic. The rest of the LV walls appears severely hypokinetic. The left ventricular internal cavity size was moderately dilated. There is no left ventricular hypertrophy. Diastolic function indeterminant due to mechanical mitral valve. Right Ventricle: The right ventricular size is normal. No increase in right ventricular wall thickness. Right ventricular systolic function is normal. There is moderately elevated pulmonary artery systolic pressure. The tricuspid regurgitant velocity is 3.27 m/s, and with an assumed right atrial pressure of 3 mmHg, the estimated right ventricular systolic pressure is 45.8 mmHg. Left Atrium: Left atrial size was severely dilated. Right Atrium: Right atrial size was normal in size. Pericardium: Trivial pericardial effusion is present. Mitral Valve: A mechanical St. Jude mitral valve is present and appears well seated with normal function by doppler interrogation. Mean gradient at HR 86bpm. There is trivial mitral regurgitation. The mitral valve has been repaired/replaced. Trivial mitral valve regurgitation. There is a St. Jude mechanical valve present in the mitral position. MV peak gradient, 6.6 mmHg. The mean mitral valve gradient is 5.0 mmHg. Tricuspid Valve: The tricuspid valve is normal in structure. Tricuspid valve regurgitation is mild. Aortic Valve: The aortic valve is tricuspid. There is mild thickening of the aortic valve. Aortic valve regurgitation is not visualized. Mild aortic  valve sclerosis is present, with no evidence of aortic valve stenosis. Pulmonic Valve: The pulmonic valve was normal in structure. Pulmonic valve regurgitation is not visualized. Aorta: The aortic root and ascending aorta are structurally normal, with no evidence of dilitation. Venous: The inferior vena cava is normal in size with greater than 50% respiratory variability, suggesting right atrial pressure of 3 mmHg. IAS/Shunts: No atrial level shunt detected by color flow Doppler. Additional Comments: A device lead is visualized.  LEFT VENTRICLE PLAX 2D LVIDd:         5.30 cm LVIDs:         4.60 cm LV PW:         1.00 cm LV IVS:        0.80 cm LVOT diam:  1.70 cm LV SV:         36 LV SV Index:   20 LVOT Area:     2.27 cm  LV Volumes (MOD) LV vol d, MOD A2C: 111.0 ml LV vol d, MOD A4C: 92.4 ml LV vol s, MOD A2C: 60.1 ml LV vol s, MOD A4C: 62.1 ml LV SV MOD A2C:     50.9 ml LV SV MOD A4C:     92.4 ml LV SV MOD BP:      42.4 ml RIGHT VENTRICLE RV S prime:     9.73 cm/s TAPSE (M-mode): 2.0 cm LEFT ATRIUM              Index       RIGHT ATRIUM           Index LA diam:        2.80 cm  1.54 cm/m  RA Area:     12.50 cm LA Vol (A2C):   95.5 ml  52.66 ml/m RA Volume:   23.70 ml  13.07 ml/m LA Vol (A4C):   111.0 ml 61.21 ml/m LA Biplane Vol: 103.0 ml 56.80 ml/m  AORTIC VALVE LVOT Vmax:   109.12 cm/s LVOT Vmean:  66.625 cm/s LVOT VTI:    0.158 m  AORTA Ao Root diam: 2.70 cm Ao Asc diam:  2.90 cm MITRAL VALVE                TRICUSPID VALVE MV Area (PHT): 2.11 cm     TR Peak grad:   42.8 mmHg MV Area VTI:   1.26 cm     TR Vmax:        327.00 cm/s MV Peak grad:  6.6 mmHg MV Mean grad:  5.0 mmHg     SHUNTS MV Vmax:       1.28 m/s     Systemic VTI:  0.16 m MV Vmean:      111.0 cm/s   Systemic Diam: 1.70 cm MV Decel Time: 359 msec MR Peak grad: 110.7 mmHg MR Mean grad: 67.0 mmHg MR Vmax:      526.00 cm/s MR Vmean:     387.0 cm/s MV E velocity: 106.00 cm/s MV A velocity: 105.00 cm/s MV E/A ratio:  1.01 Heather Pemberton MD  Electronically signed by Heather Pemberton MD Signature Date/Time: 07/22/2020/12:42:50 PM    Final        Assessment / Plan:    #1 74-year-old African-American female admitted with GI bleeding in setting of supratherapeutic INR.  Coumadin has been held and INR today down to 2 Heparin to be started today No active bleeding but hemoglobin has drifted, to be transfused 1 unit today  #2 status post mitral valve replacement, #3 CHF-EF 20%  Plan; heparin will be held this morning Patient has been scheduled for EGD with Dr. Pyrtle this a.m.  Procedure was discussed again with patient this morning in detail including indications risk and benefits and she is agreeable to proceed   Will resume heparin post EGD    Principal Problem:   Acute upper GI bleed Active Problems:   Mitral valve disorder. (Status post mechanical valve replacement)   SYSTOLIC HEART FAILURE, CHRONIC   Long term (current) use of anticoagulants   Acute on chronic anemia   AKI (acute kidney injury) (HCC)   Lactic acidosis   Warfarin-induced coagulopathy (HCC)     LOS: 4 days   Ardon Franklin  07/24/2020, 8:33 AM  

## 2020-07-24 NOTE — Interval H&P Note (Signed)
History and Physical Interval Note: For egd today to eval dark/melenic heme positive stool with recent nausea vomiting. INR had been supratherapeutic but has drifted down and is now 2.0 Heparin was briefly started but on hold History of H. pylori plan biopsies to confirm eradication The nature of the procedure, as well as the risks, benefits, and alternatives were carefully and thoroughly reviewed with the patient. Ample time for discussion and questions allowed. The patient understood, was satisfied, and agreed to proceed.    Lab Results  Component Value Date   INR 2.0 (H) 07/24/2020   INR 4.0 (H) 07/23/2020   INR 6.9 (HH) 07/22/2020   CBC Latest Ref Rng & Units 07/24/2020 07/23/2020 07/22/2020  WBC 4.0 - 10.5 K/uL 9.7 8.6 8.9  Hemoglobin 12.0 - 15.0 g/dL 7.5(L) 8.8(L) 10.8(L)  Hematocrit 36.0 - 46.0 % 22.8(L) 26.5(L) 32.9(L)  Platelets 150 - 400 K/uL 120(L) 107(L) 105(L)    07/24/2020 10:51 AM  Deborah [name]  has presented today for surgery, with the diagnosis of anemia.  The various methods of treatment have been discussed with the patient and family. After consideration of risks, benefits and other options for treatment, the patient has consented to  Procedure(s): ESOPHAGOGASTRODUODENOSCOPY (EGD) WITH PROPOFOL (N/A) as a surgical intervention.  The patient's history has been reviewed, patient examined, no change in status, stable for surgery.  I have reviewed the patient's chart and labs.  Questions were answered to the patient's satisfaction.     Carie Caddy Darus Hershman

## 2020-07-24 NOTE — Transfer of Care (Signed)
Immediate Anesthesia Transfer of Care Note  Patient: SHERICA PATERNOSTRO  Procedure(s) Performed: ESOPHAGOGASTRODUODENOSCOPY (EGD) WITH PROPOFOL (N/A )  Patient Location: Endoscopy Unit  Anesthesia Type:MAC  Level of Consciousness: awake and confused  Airway & Oxygen Therapy: Patient Spontanous Breathing and Patient connected to nasal cannula oxygen  Post-op Assessment: Report given to RN, Post -op Vital signs reviewed and stable and Patient moving all extremities  Post vital signs: Reviewed and stable  Last Vitals:  Vitals Value Taken Time  BP 116/58 07/24/20 1204  Temp 36.7 C 07/24/20 1204  Pulse 94 07/24/20 1204  Resp 18 07/24/20 1204  SpO2 100 % 07/24/20 1204    Last Pain:  Vitals:   07/24/20 1204  TempSrc: Oral  PainSc: 0-No pain         Complications: No complications documented.

## 2020-07-24 NOTE — Progress Notes (Signed)
PROGRESS NOTE        PATIENT DETAILS Name: Deborah [name] Age: 75 y.o. Sex: female Date of Birth: Jun 15, 1945 Admit Date: 07/20/2020 Admitting Physician Angie Fava, DO UKR:[id number], Campbell Lerner, MD  Brief Narrative: Patient is a 75 y.o. female with history of chronic systolic heart failure, mitral valve replacement (mechanical valve)-prior history of GI bleeding due to gastric/duodenal ulcers-presenting with dark-colored FOBT positive stools and a hemoglobin of 6.9-in the setting of supratherapeutic INR.  See below for further details.  Significant events: 4/11>> admit for upper GI bleeding-acute blood loss anemia-supratherapeutic INR.  Significant studies: 4/11>> chest x-ray: Possible airspace disease in the left base 4/13>> Echo: EF 20-25%, old septal segment/basal inferior wall appears akinetic, rest of the LV wall severely hypokinetic.  Antimicrobial therapy: Vancomycin: 4/11 x 1 Flagyl: 4/11 x 1 Cefepime: 4/11 x 1  Microbiology data: 4/11>> blood culture: No growth 4/11>> Covid/influenza PCR: Negative  Procedures : None  Consults: GI, cardiology  DVT Prophylaxis : SCDs Start: 07/20/20 2158   Subjective: No BM since admission-lying comfortably in bed.  Assessment/Plan: Upper GI bleeding with acute blood loss anemia: No BM since admission however hemoglobin slowly downtrending-thankfully INR is down to 2 today.  Remains on PPI-agree with 1 unit of PRBC transfusion-GI planning EGD later today.  Supratherapeutic INR: Has mechanical mitral valve-INR is now less than 2.5-Heparin infusion started-but currently on hold for EGD-we will need to resume post EGD depending on EGD results.  Cardiology following  AKI: Hemodynamically mediated-slowly improving-but has plateaued in the 1.3 range over the past few days.  Watch closely.  Thrombocytopenia: Appears to be mild-stable for follow-up without any further work-up.  Chronic systolic heart  failure: Euvolemic-plans are to gently restart her medications over the next few days  History of severe mitral regurgitation-s/p mechanical mitral valve replacement: On chronic Coumadin therapy--currently on hold-'s INR was supratherapeutic on admission.  Plan to start heparin today unless contraindications on EGD.  Obesity: Estimated body mass index is 26.28 kg/m as calculated from the following:   Height as of this encounter: 5\' 3"  (1.6 m).   Weight as of this encounter: 67.3 kg.    Diet: Diet Order            Diet NPO time specified  Diet effective now                  Code Status: Full code   Family Communication: Sister-Norma [phone] voicemail on 4/14, 4/15  Disposition Plan: Status is: Inpatient  Remains inpatient appropriate because:Inpatient level of care appropriate due to severity of illness   Dispo: The patient is from: Home              Anticipated d/c is to: Home              Patient currently is not medically stable to d/c.   Difficult to place patient No    Barriers to Discharge: Awaiting EGD once INR is acceptable.  Antimicrobial agents: Anti-infectives (From admission, onward)   Start     Dose/Rate Route Frequency Ordered Stop   07/21/20 2000  ceFEPIme (MAXIPIME) 2 g in sodium chloride 0.9 % 100 mL IVPB  Status:  Discontinued        2 g 200 mL/hr over 30 Minutes Intravenous Every 24 hours 07/20/20 2037 07/20/20 2156   07/21/20 2000  ceFEPIme (  MAXIPIME) 2 g in sodium chloride 0.9 % 100 mL IVPB  Status:  Discontinued        2 g 200 mL/hr over 30 Minutes Intravenous Every 24 hours 07/21/20 0234 07/21/20 1604   07/21/20 0330  vancomycin (VANCOREADY) IVPB 750 mg/150 mL  Status:  Discontinued        750 mg 150 mL/hr over 60 Minutes Intravenous Every 48 hours 07/21/20 0234 07/21/20 1604   07/20/20 1915  ceFEPIme (MAXIPIME) 2 g in sodium chloride 0.9 % 100 mL IVPB        2 g 200 mL/hr over 30 Minutes Intravenous  Once 07/20/20 1905 07/20/20  2029   07/20/20 1915  metroNIDAZOLE (FLAGYL) IVPB 500 mg        500 mg 100 mL/hr over 60 Minutes Intravenous  Once 07/20/20 1905 07/20/20 2137       Time spent: 25- minutes-Greater than 50% of this time was spent in counseling, explanation of diagnosis, planning of further management, and coordination of care.  MEDICATIONS: Scheduled Meds: . [MAR Hold] sodium chloride   Intravenous Once  . [MAR Hold] insulin aspart  0-6 Units Subcutaneous Q6H  . [MAR Hold] pantoprazole  40 mg Oral BID   Continuous Infusions: . heparin Stopped (07/24/20 0833)  . lactated ringers 125 mL/hr at 07/24/20 1017   PRN Meds:.[MAR Hold] acetaminophen, [MAR Hold] ondansetron (ZOFRAN) IV, [MAR Hold] polyethylene glycol   PHYSICAL EXAM: Vital signs: Vitals:   07/24/20 0530 07/24/20 0737 07/24/20 1006 07/24/20 1017  BP: 113/60 105/61 (!) 129/102 (!) 141/96  Pulse:  85  93  Resp: (!) 22 20 (!) 25 (!) 21  Temp:  98.5 F (36.9 C) 98.2 F (36.8 C)   TempSrc:  Oral Oral   SpO2:  98% 100% 99%  Weight:   67.3 kg   Height:   5\' 3"  (1.6 m)    Filed Weights   07/23/20 0650 07/24/20 0500 07/24/20 1006  Weight: 69.8 kg 67.3 kg 67.3 kg   Body mass index is 26.28 kg/m.   Gen Exam:Alert awake-not in any distress HEENT:atraumatic, normocephalic Chest: B/L clear to auscultation anteriorly CVS:S1S2 regular Abdomen:soft non tender, non distended Extremities:no edema Neurology: Non focal Skin: no rash  I have personally reviewed following labs and imaging studies  LABORATORY DATA: CBC: Recent Labs  Lab 07/20/20 1831 07/21/20 0131 07/21/20 0929 07/21/20 1134 07/21/20 1832 07/22/20 0630 07/23/20 0058 07/24/20 0034  WBC 10.8*  --  11.7* 10.9* 10.1 8.9 8.6 9.7  NEUTROABS 9.2*  --  9.3*  --   --   --   --   --   HGB 6.9*   < > 12.2  12.3 11.1* 10.7* 10.8* 8.8* 7.5*  HCT 22.0*   < > 36.3  36.5 33.3* 32.0* 32.9* 26.5* 22.8*  MCV 100.9*  --  92.1 92.0 92.2 93.7 95.3 96.2  PLT 177  --  107* 105* 113*  105* 107* 120*   < > = values in this interval not displayed.    Basic Metabolic Panel: Recent Labs  Lab 07/20/20 1831 07/21/20 0929 07/22/20 0630 07/23/20 0058 07/24/20 0034  NA 139 144 144 141 138  K 4.2 4.2 4.3 4.0 4.2  CL 108 114* 112* 111 110  CO2 24 23 28 27 28   GLUCOSE 271* 188* 112* 108* 106*  BUN 77* 64* 52* 50* 49*  CREATININE 1.88* 1.60* 1.29* 1.33* 1.35*  CALCIUM 8.4* 8.2* 8.4* 8.0* 7.9*  MG  --  1.7  1.8 1.7  --   --  PHOS  --   --  2.6  --   --     GFR: Estimated Creatinine Clearance: 32.7 mL/min (A) (by C-G formula based on SCr of 1.35 mg/dL (H)).  Liver Function Tests: Recent Labs  Lab 07/20/20 1831 07/21/20 0929  AST 14* 16  ALT 15 13  ALKPHOS 38 35*  BILITOT 0.5 0.7  PROT 5.4* 5.0*  ALBUMIN 3.1* 3.0*   Recent Labs  Lab 07/20/20 1831  LIPASE 34   No results for input(s): AMMONIA in the last 168 hours.  Coagulation Profile: Recent Labs  Lab 07/21/20 0929 07/21/20 1452 07/22/20 0630 07/23/20 0058 07/24/20 0034  INR 5.8* 7.6* 6.9* 4.0* 2.0*    Cardiac Enzymes: No results for input(s): CKTOTAL, CKMB, CKMBINDEX, TROPONINI in the last 168 hours.  BNP (last 3 results) No results for input(s): PROBNP in the last 8760 hours.  Lipid Profile: No results for input(s): CHOL, HDL, LDLCALC, TRIG, CHOLHDL, LDLDIRECT in the last 72 hours.  Thyroid Function Tests: No results for input(s): TSH, T4TOTAL, FREET4, T3FREE, THYROIDAB in the last 72 hours.  Anemia Panel: No results for input(s): VITAMINB12, FOLATE, FERRITIN, TIBC, IRON, RETICCTPCT in the last 72 hours.  Urine analysis:    Component Value Date/Time   COLORURINE YELLOW 07/20/2020 2355   APPEARANCEUR HAZY (A) 07/20/2020 2355   LABSPEC 1.014 07/20/2020 2355   PHURINE 5.0 07/20/2020 2355   GLUCOSEU NEGATIVE 07/20/2020 2355   HGBUR LARGE (A) 07/20/2020 2355   BILIRUBINUR NEGATIVE 07/20/2020 2355   KETONESUR 5 (A) 07/20/2020 2355   PROTEINUR NEGATIVE 07/20/2020 2355    UROBILINOGEN 1.0 05/10/2011 1440   NITRITE NEGATIVE 07/20/2020 2355   LEUKOCYTESUR TRACE (A) 07/20/2020 2355    Sepsis Labs: Lactic Acid, Venous    Component Value Date/Time   LATICACIDVEN 1.8 07/21/2020 0107    MICROBIOLOGY: Recent Results (from the past 240 hour(s))  Urine culture     Status: None   Collection Time: 07/20/20  7:05 PM   Specimen: In/Out Cath Urine  Result Value Ref Range Status   Specimen Description IN/OUT CATH URINE  Final   Special Requests NONE  Final   Culture   Final    NO GROWTH Performed at [facility], [address] [place], [place] [address]    Report Status 07/23/2020 FINAL  Final  Blood Culture (routine x 2)     Status: None (Preliminary result)   Collection Time: 07/20/20  7:33 PM   Specimen: BLOOD  Result Value Ref Range Status   Specimen Description BLOOD SITE NOT SPECIFIED  Final   Special Requests   Final    BOTTLES DRAWN AEROBIC AND ANAEROBIC Blood Culture results may not be optimal due to an inadequate volume of blood received in culture bottles   Culture   Final    NO GROWTH 4 DAYS Performed at [facility], [address], [place], [place] [address]    Report Status PENDING  Incomplete  Resp Panel by RT-PCR (Flu A&B, Covid) Nasopharyngeal Swab     Status: None   Collection Time: 07/20/20  7:46 PM   Specimen: Nasopharyngeal Swab; Nasopharyngeal(NP) swabs in vial transport medium  Result Value Ref Range Status   SARS Coronavirus 2 by RT PCR NEGATIVE NEGATIVE Final    Comment: (NOTE) SARS-CoV-2 target nucleic acids are NOT DETECTED.  The SARS-CoV-2 RNA is generally detectable in upper respiratory specimens during the acute phase of infection. The lowest concentration of SARS-CoV-2 viral copies this assay can detect  is 138 copies/mL. A negative result does not preclude SARS-Cov-2 infection and should not be used as the sole basis for treatment or other patient management decisions. A negative result may occur with   improper specimen collection/handling, submission of specimen other than nasopharyngeal swab, presence of viral mutation(s) within the areas targeted by this assay, and inadequate number of viral copies(<138 copies/mL). A negative result must be combined with clinical observations, patient history, and epidemiological information. The expected result is Negative.  Fact Sheet for Patients:  BloggerCourse.com  Fact Sheet for Healthcare Providers:  SeriousBroker.it  This test is no t yet approved or cleared by the [place] FDA and  has been authorized for detection and/or diagnosis of SARS-CoV-2 by FDA under an Emergency Use Authorization (EUA). This EUA will remain  in effect (meaning this test can be used) for the duration of the COVID-19 declaration under Section 564(b)(1) of the Act, 21 U.S.C.section 360bbb-3(b)(1), unless the authorization is terminated  or revoked sooner.       Influenza A by PCR NEGATIVE NEGATIVE Final   Influenza B by PCR NEGATIVE NEGATIVE Final    Comment: (NOTE) The Xpert Xpress SARS-CoV-2/FLU/RSV plus assay is intended as an aid in the diagnosis of influenza from Nasopharyngeal swab specimens and should not be used as a sole basis for treatment. Nasal washings and aspirates are unacceptable for Xpert Xpress SARS-CoV-2/FLU/RSV testing.  Fact Sheet for Patients: BloggerCourse.com  Fact Sheet for Healthcare Providers: SeriousBroker.it  This test is not yet approved or cleared by the [place] FDA and has been authorized for detection and/or diagnosis of SARS-CoV-2 by FDA under an Emergency Use Authorization (EUA). This EUA will remain in effect (meaning this test can be used) for the duration of the COVID-19 declaration under Section 564(b)(1) of the Act, 21 U.S.C. section 360bbb-3(b)(1), unless the authorization is terminated  or revoked.  Performed at [facility], [address] [place], [place] [address]   Blood Culture (routine x 2)     Status: None (Preliminary result)   Collection Time: 07/20/20  7:47 PM   Specimen: BLOOD  Result Value Ref Range Status   Specimen Description BLOOD SITE NOT SPECIFIED  Final   Special Requests   Final    BOTTLES DRAWN AEROBIC AND ANAEROBIC Blood Culture results may not be optimal due to an inadequate volume of blood received in culture bottles   Culture   Final    NO GROWTH 4 DAYS Performed at [facility], [address] [place], [place] [address]    Report Status PENDING  Incomplete    RADIOLOGY STUDIES/RESULTS: ECHOCARDIOGRAM COMPLETE  Result Date: 07/22/2020    ECHOCARDIOGRAM REPORT   Patient Name:   TEKELA GARGUILO Date of Exam: 07/22/2020 Medical Rec #:  [id number]      Height:       63.0 in Accession #:    [id number]     Weight:       172.0 lb Date of Birth:  November 25, 1945       BSA:          1.813 m Patient Age:    74 years       BP:           99/74 mmHg Patient Gender: F              HR:           91 bpm. Exam Location:  Inpatient Procedure: 2D Echo, Cardiac Doppler and Color  Doppler Indications:    CHF-Acute Systolic I50.21  History:        Patient has prior history of Echocardiogram examinations, most                 recent 06/18/2018. CHF; Risk Factors:Hypertension, Dyslipidemia                 and Diabetes.  Sonographer:    Eulah Pont RDCS Referring Phys: [id number] RHONDA G BARRETT IMPRESSIONS  1. Left ventricular ejection fraction, by estimation, is 20 to 25%. The left ventricle has severely decreased function. The left ventricle demonstrates regional wall motion abnormalities.         All septal segments and the basal inferior segment appear akinetic. The rest of the LV walls appears severely hypokinetic. The left ventricular internal cavity size was moderately dilated. Diastolic function indeterminant due to mechanical mitral valve.  2. Right ventricular  systolic function is normal. The right ventricular size is normal. There is moderately elevated pulmonary artery systolic pressure. The estimated right ventricular systolic pressure is 45.8 mmHg.  3. Left atrial size was severely dilated.  4. The mitral valve has been repaired/replaced. A mechanical St. Jude mitral valve is present and appears well seated with normal function by doppler interrogation. Mean gradient at HR 86bpm. There is trivial mitral regurgitation.  5. The aortic valve is tricuspid. There is mild thickening of the aortic valve. Aortic valve regurgitation is not visualized. Mild aortic valve sclerosis is present, with no evidence of aortic valve stenosis.  6. The inferior vena cava is normal in size with greater than 50% respiratory variability, suggesting right atrial pressure of 3 mmHg. Comparison(s): No significant change from prior study. FINDINGS  Left Ventricle: Left ventricular ejection fraction, by estimation, is 20 to 25%. The left ventricle has severely decreased function. The left ventricle demonstrates regional wall motion abnormalities. All septal segments and the basal inferior segment appear akinetic. The rest of the LV walls appears severely hypokinetic. The left ventricular internal cavity size was moderately dilated. There is no left ventricular hypertrophy. Diastolic function indeterminant due to mechanical mitral valve. Right Ventricle: The right ventricular size is normal. No increase in right ventricular wall thickness. Right ventricular systolic function is normal. There is moderately elevated pulmonary artery systolic pressure. The tricuspid regurgitant velocity is 3.27 m/s, and with an assumed right atrial pressure of 3 mmHg, the estimated right ventricular systolic pressure is 45.8 mmHg. Left Atrium: Left atrial size was severely dilated. Right Atrium: Right atrial size was normal in size. Pericardium: Trivial pericardial effusion is present. Mitral Valve: A  mechanical St. Jude mitral valve is present and appears well seated with normal function by doppler interrogation. Mean gradient at HR 86bpm. There is trivial mitral regurgitation. The mitral valve has been repaired/replaced. Trivial mitral valve regurgitation. There is a St. Jude mechanical valve present in the mitral position. MV peak gradient, 6.6 mmHg. The mean mitral valve gradient is 5.0 mmHg. Tricuspid Valve: The tricuspid valve is normal in structure. Tricuspid valve regurgitation is mild. Aortic Valve: The aortic valve is tricuspid. There is mild thickening of the aortic valve. Aortic valve regurgitation is not visualized. Mild aortic valve sclerosis is present, with no evidence of aortic valve stenosis. Pulmonic Valve: The pulmonic valve was normal in structure. Pulmonic valve regurgitation is not visualized. Aorta: The aortic root and ascending aorta are structurally normal, with no evidence of dilitation. Venous: The inferior vena cava is normal in size with greater than 50% respiratory  variability, suggesting right atrial pressure of 3 mmHg. IAS/Shunts: No atrial level shunt detected by color flow Doppler. Additional Comments: A device lead is visualized.  LEFT VENTRICLE PLAX 2D LVIDd:         5.30 cm LVIDs:         4.60 cm LV PW:         1.00 cm LV IVS:        0.80 cm LVOT diam:     1.70 cm LV SV:         36 LV SV Index:   20 LVOT Area:     2.27 cm  LV Volumes (MOD) LV vol d, MOD A2C: 111.0 ml LV vol d, MOD A4C: 92.4 ml LV vol s, MOD A2C: 60.1 ml LV vol s, MOD A4C: 62.1 ml LV SV MOD A2C:     50.9 ml LV SV MOD A4C:     92.4 ml LV SV MOD BP:      42.4 ml RIGHT VENTRICLE RV S prime:     9.73 cm/s TAPSE (M-mode): 2.0 cm LEFT ATRIUM              Index       RIGHT ATRIUM           Index LA diam:        2.80 cm  1.54 cm/m  RA Area:     12.50 cm LA Vol (A2C):   95.5 ml  52.66 ml/m RA Volume:   23.70 ml  13.07 ml/m LA Vol (A4C):   111.0 ml 61.21 ml/m LA Biplane Vol: 103.0 ml 56.80 ml/m  AORTIC  VALVE LVOT Vmax:   109.12 cm/s LVOT Vmean:  66.625 cm/s LVOT VTI:    0.158 m  AORTA Ao Root diam: 2.70 cm Ao Asc diam:  2.90 cm MITRAL VALVE                TRICUSPID VALVE MV Area (PHT): 2.11 cm     TR Peak grad:   42.8 mmHg MV Area VTI:   1.26 cm     TR Vmax:        327.00 cm/s MV Peak grad:  6.6 mmHg MV Mean grad:  5.0 mmHg     SHUNTS MV Vmax:       1.28 m/s     Systemic VTI:  0.16 m MV Vmean:      111.0 cm/s   Systemic Diam: 1.70 cm MV Decel Time: 359 msec MR Peak grad: 110.7 mmHg MR Mean grad: 67.0 mmHg MR Vmax:      526.00 cm/s MR Vmean:     387.0 cm/s MV E velocity: 106.00 cm/s MV A velocity: 105.00 cm/s MV E/A ratio:  1.01 Laurance FlattenHeather Pemberton MD Electronically signed by Laurance FlattenHeather Pemberton MD Signature Date/Time: 07/22/2020/12:42:50 PM    Final      LOS: 4 days   Jeoffrey MassedShanker Leone Mobley, MD  [facility]    To contact the attending provider between 7A-7P or the covering provider during after hours 7P-7A, please log into the web site [email or web address] and access using universal [place] password for that web site. If you do not have the password, please call the hospital operator.  07/24/2020, 10:19 AM

## 2020-07-24 NOTE — Progress Notes (Signed)
Progress Note  Patient Name: LARAYAH CLUTE Date of Encounter: 07/24/2020  [facility] Cardiologist: Arvilla Meres, MD   Subjective   Comfortable in endoscopy.  Awaiting EGD.  No chest pain no shortness of breath.  Inpatient Medications    Scheduled Meds: . [MAR Hold] sodium chloride   Intravenous Once  . [MAR Hold] insulin aspart  0-6 Units Subcutaneous Q6H  . [MAR Hold] pantoprazole  40 mg Oral BID   Continuous Infusions: . heparin Stopped (07/24/20 0833)  . lactated ringers 125 mL/hr at 07/24/20 1017   PRN Meds: [MAR Hold] acetaminophen, [MAR Hold] ondansetron (ZOFRAN) IV, [MAR Hold] polyethylene glycol   Vital Signs    Vitals:   07/24/20 0530 07/24/20 0737 07/24/20 1006 07/24/20 1017  BP: 113/60 105/61 (!) 129/102 (!) 141/96  Pulse:  85  93  Resp: (!) 22 20 (!) 25 (!) 21  Temp:  98.5 F (36.9 C) 98.2 F (36.8 C)   TempSrc:  Oral Oral   SpO2:  98% 100% 99%  Weight:   67.3 kg   Height:   5\' 3"  (1.6 m)     Intake/Output Summary (Last 24 hours) at 07/24/2020 1044 Last data filed at 07/23/2020 2234 Gross per 24 hour  Intake 480 ml  Output 500 ml  Net -20 ml   Last 3 Weights 07/24/2020 07/24/2020 07/23/2020  Weight (lbs) 148 lb 5.9 oz 148 lb 5.9 oz 153 lb 14.1 oz  Weight (kg) 67.3 kg 67.3 kg 69.8 kg       Physical Exam   GEN: No acute distress.   Neck: No JVD Cardiac: RRR, S1 click , no murmurs, rubs, or gallops.  Respiratory: Clear to auscultation bilaterally. GI: Soft, nontender, non-distended  MS: No edema; No deformity. Neuro:  Nonfocal  Psych: Normal affect   Labs    High Sensitivity Troponin:  No results for input(s): TROPONINIHS in the last 720 hours.    Chemistry Recent Labs  Lab 07/20/20 1831 07/21/20 0929 07/22/20 0630 07/23/20 0058 07/24/20 0034  NA 139 144 144 141 138  K 4.2 4.2 4.3 4.0 4.2  CL 108 114* 112* 111 110  CO2 24 23 28 27 28   GLUCOSE 271* 188* 112* 108* 106*  BUN 77* 64* 52* 50* 49*  CREATININE 1.88* 1.60*  1.29* 1.33* 1.35*  CALCIUM 8.4* 8.2* 8.4* 8.0* 7.9*  PROT 5.4* 5.0*  --   --   --   ALBUMIN 3.1* 3.0*  --   --   --   AST 14* 16  --   --   --   ALT 15 13  --   --   --   ALKPHOS 38 35*  --   --   --   BILITOT 0.5 0.7  --   --   --   GFRNONAA 27* 33* 43* 41* 41*  ANIONGAP 7 7 4* 3* 0*     Hematology Recent Labs  Lab 07/22/20 0630 07/23/20 0058 07/24/20 0034  WBC 8.9 8.6 9.7  RBC 3.51* 2.78* 2.37*  HGB 10.8* 8.8* 7.5*  HCT 32.9* 26.5* 22.8*  MCV 93.7 95.3 96.2  MCH 30.8 31.7 31.6  MCHC 32.8 33.2 32.9  RDW 16.4* 15.9* 15.6*  PLT 105* 107* 120*    BNPNo results for input(s): BNP, PROBNP in the last 168 hours.   DDimer No results for input(s): DDIMER in the last 168 hours.   Radiology    ECHOCARDIOGRAM COMPLETE  Result Date: 07/22/2020    ECHOCARDIOGRAM REPORT  Patient Name:   MARIENE DICKERMAN Date of Exam: 07/22/2020 Medical Rec #:  [id number]      Height:       63.0 in Accession #:    [id number]     Weight:       172.0 lb Date of Birth:  11/03/1945       BSA:          1.813 m Patient Age:    75 years       BP:           99/74 mmHg Patient Gender: F              HR:           91 bpm. Exam Location:  Inpatient Procedure: 2D Echo, Cardiac Doppler and Color Doppler Indications:    CHF-Acute Systolic I50.21  History:        Patient has prior history of Echocardiogram examinations, most                 recent 06/18/2018. CHF; Risk Factors:Hypertension, Dyslipidemia                 and Diabetes.  Sonographer:    Eulah Pont RDCS Referring Phys: [id number] RHONDA G BARRETT IMPRESSIONS  1. Left ventricular ejection fraction, by estimation, is 20 to 25%. The left ventricle has severely decreased function. The left ventricle demonstrates regional wall motion abnormalities.         All septal segments and the basal inferior segment appear akinetic. The rest of the LV walls appears severely hypokinetic. The left ventricular internal cavity size was moderately dilated. Diastolic function indeterminant  due to mechanical mitral valve.  2. Right ventricular systolic function is normal. The right ventricular size is normal. There is moderately elevated pulmonary artery systolic pressure. The estimated right ventricular systolic pressure is 45.8 mmHg.  3. Left atrial size was severely dilated.  4. The mitral valve has been repaired/replaced. A mechanical St. Jude mitral valve is present and appears well seated with normal function by doppler interrogation. Mean gradient at HR 86bpm. There is trivial mitral regurgitation.  5. The aortic valve is tricuspid. There is mild thickening of the aortic valve. Aortic valve regurgitation is not visualized. Mild aortic valve sclerosis is present, with no evidence of aortic valve stenosis.  6. The inferior vena cava is normal in size with greater than 50% respiratory variability, suggesting right atrial pressure of 3 mmHg. Comparison(s): No significant change from prior study. FINDINGS  Left Ventricle: Left ventricular ejection fraction, by estimation, is 20 to 25%. The left ventricle has severely decreased function. The left ventricle demonstrates regional wall motion abnormalities. All septal segments and the basal inferior segment appear akinetic. The rest of the LV walls appears severely hypokinetic. The left ventricular internal cavity size was moderately dilated. There is no left ventricular hypertrophy. Diastolic function indeterminant due to mechanical mitral valve. Right Ventricle: The right ventricular size is normal. No increase in right ventricular wall thickness. Right ventricular systolic function is normal. There is moderately elevated pulmonary artery systolic pressure. The tricuspid regurgitant velocity is 3.27 m/s, and with an assumed right atrial pressure of 3 mmHg, the estimated right ventricular systolic pressure is 45.8 mmHg. Left Atrium: Left atrial size was severely dilated. Right Atrium: Right atrial size was normal in size. Pericardium: Trivial  pericardial effusion is present. Mitral Valve: A mechanical St. Jude mitral valve is present and appears well seated with normal function by doppler interrogation.  Mean gradient at HR 86bpm. There is trivial mitral regurgitation. The mitral valve has been repaired/replaced. Trivial mitral valve regurgitation. There is a St. Jude mechanical valve present in the mitral position. MV peak gradient, 6.6 mmHg. The mean mitral valve gradient is 5.0 mmHg. Tricuspid Valve: The tricuspid valve is normal in structure. Tricuspid valve regurgitation is mild. Aortic Valve: The aortic valve is tricuspid. There is mild thickening of the aortic valve. Aortic valve regurgitation is not visualized. Mild aortic valve sclerosis is present, with no evidence of aortic valve stenosis. Pulmonic Valve: The pulmonic valve was normal in structure. Pulmonic valve regurgitation is not visualized. Aorta: The aortic root and ascending aorta are structurally normal, with no evidence of dilitation. Venous: The inferior vena cava is normal in size with greater than 50% respiratory variability, suggesting right atrial pressure of 3 mmHg. IAS/Shunts: No atrial level shunt detected by color flow Doppler. Additional Comments: A device lead is visualized.  LEFT VENTRICLE PLAX 2D LVIDd:         5.30 cm LVIDs:         4.60 cm LV PW:         1.00 cm LV IVS:        0.80 cm LVOT diam:     1.70 cm LV SV:         36 LV SV Index:   20 LVOT Area:     2.27 cm  LV Volumes (MOD) LV vol d, MOD A2C: 111.0 ml LV vol d, MOD A4C: 92.4 ml LV vol s, MOD A2C: 60.1 ml LV vol s, MOD A4C: 62.1 ml LV SV MOD A2C:     50.9 ml LV SV MOD A4C:     92.4 ml LV SV MOD BP:      42.4 ml RIGHT VENTRICLE RV S prime:     9.73 cm/s TAPSE (M-mode): 2.0 cm LEFT ATRIUM              Index       RIGHT ATRIUM           Index LA diam:        2.80 cm  1.54 cm/m  RA Area:     12.50 cm LA Vol (A2C):   95.5 ml  52.66 ml/m RA Volume:   23.70 ml  13.07 ml/m LA Vol (A4C):   111.0 ml 61.21 ml/m  LA Biplane Vol: 103.0 ml 56.80 ml/m  AORTIC VALVE LVOT Vmax:   109.12 cm/s LVOT Vmean:  66.625 cm/s LVOT VTI:    0.158 m  AORTA Ao Root diam: 2.70 cm Ao Asc diam:  2.90 cm MITRAL VALVE                TRICUSPID VALVE MV Area (PHT): 2.11 cm     TR Peak grad:   42.8 mmHg MV Area VTI:   1.26 cm     TR Vmax:        327.00 cm/s MV Peak grad:  6.6 mmHg MV Mean grad:  5.0 mmHg     SHUNTS MV Vmax:       1.28 m/s     Systemic VTI:  0.16 m MV Vmean:      111.0 cm/s   Systemic Diam: 1.70 cm MV Decel Time: 359 msec MR Peak grad: 110.7 mmHg MR Mean grad: 67.0 mmHg MR Vmax:      526.00 cm/s MR Vmean:     387.0 cm/s MV E velocity: 106.00 cm/s MV A velocity:  105.00 cm/s MV E/A ratio:  1.01 Laurance Flatten MD Electronically signed by Laurance Flatten MD Signature Date/Time: 07/22/2020/12:42:50 PM    Final     Cardiac Studies    ECHO this admit   1. Left ventricular ejection fraction, by estimation, is 20 to 25%. The  left ventricle has severely decreased function. The left ventricle  demonstrates regional wall motion abnormalities.      All septal segments and the basal inferior segment appear akinetic.  The rest of the LV walls appears severely hypokinetic. The left  ventricular internal cavity size was moderately dilated. Diastolic  function indeterminant due to mechanical mitral  valve.  2. Right ventricular systolic function is normal. The right ventricular  size is normal. There is moderately elevated pulmonary artery systolic  pressure. The estimated right ventricular systolic pressure is 45.8 mmHg.  3. Left atrial size was severely dilated.  4. The mitral valve has been repaired/replaced. A mechanical St. Jude  mitral valve is present and appears well seated with normal function by  doppler interrogation. Mean gradient at HR 86bpm. There is trivial  mitral regurgitation.  5. The aortic valve is tricuspid. There is mild thickening of the aortic  valve. Aortic valve regurgitation  is not visualized. Mild aortic valve  sclerosis is present, with no evidence of aortic valve stenosis.  6. The inferior vena cava is normal in size with greater than 50%  respiratory variability, suggesting right atrial pressure of 3 mmHg.   Comparison(s): No significant change from prior study.   Patient Profile     75 y.o. female with GI bleed, mechanical mitral valve, EF 20%   Assessment & Plan    Mechanical mitral valve with chronic systolic heart failure EF 20% -INR this morning 2.0 just past midnight. -Awaiting EGD today. - When comfortable from a GI perspective, I would like for her to resume heparin IV no bolus to protect her mechanical mitral valve.  It would be ideal to initiate later today if possible.  Concerned about mechanical valve thrombosis especially in her low flow state.  Totally understand however the potential risks of severe bleeding. -No evidence of volume overload.  Comfortable.  Holding many of her goal-directed medical therapy medications.  Gently reintroduce when able.       For questions or updates, please contact [facility] Please consult [email or web address] for contact info under        Signed, Donato Schultz, MD  07/24/2020, 10:44 AM

## 2020-07-24 NOTE — Anesthesia Procedure Notes (Signed)
Procedure Name: MAC Date/Time: 07/24/2020 11:33 AM Performed by: Leonor Liv, CRNA Pre-anesthesia Checklist: Patient identified, Emergency Drugs available, Suction available, Patient being monitored and Timeout performed Patient Re-evaluated:Patient Re-evaluated prior to induction Oxygen Delivery Method: Nasal cannula Airway Equipment and Method: Bite block Placement Confirmation: positive ETCO2 Dental Injury: Teeth and Oropharynx as per pre-operative assessment

## 2020-07-24 NOTE — Op Note (Signed)
[facility] Name: Deborah HarborJanice [name] Procedure Date : 07/24/2020 MRN: [id number] Attending MD: Beverley FiedlerJay M Vear Staton , MD Date of Birth: 06/15/1945 CSN: [id number] Age: 75 Admit Type: Inpatient Procedure:                Upper GI endoscopy Indications:              Melena, acute on chronic anemia, nausea and                            vomiting prior to admission Providers:                Carie CaddyJay M. Rhea BeltonPyrtle, MD, Charlett LangoMike Marshall, RN, Sunday CornFaustina                            Mbumina, Technician Referring MD:             [facility] Medicines:                Monitored Anesthesia Care Complications:            No immediate complications. Estimated Blood Loss:     Estimated blood loss was minimal. Procedure:                Pre-Anesthesia Assessment:                           - Prior to the procedure, a History and Physical                            was performed, and patient medications and                            allergies were reviewed. The patient's tolerance of                            previous anesthesia was also reviewed. The risks                            and benefits of the procedure and the sedation                            options and risks were discussed with the patient.                            All questions were answered, and informed consent                            was obtained. Prior Anticoagulants: The patient has                            taken Coumadin (warfarin), last dose was 4 days                            prior to procedure. ASA Grade Assessment: III - A  patient with severe systemic disease. After                            reviewing the risks and benefits, the patient was                            deemed in satisfactory condition to undergo the                            procedure.                           After obtaining informed consent, the endoscope was                            passed under direct vision.  Throughout the                            procedure, the patient's blood pressure, pulse, and                            oxygen saturations were monitored continuously. The                            GIF-H190 ([id number]) Olympus gastroscope was                            introduced through the mouth, and advanced to the                            second part of duodenum. The upper GI endoscopy was                            accomplished without difficulty. The patient                            tolerated the procedure well. Scope In: Scope Out: Findings:      The examined esophagus was normal.      Moderate inflammation characterized by erythema and granularity was       found in the entire examined stomach. Biopsies were taken with a cold       forceps for histology and Helicobacter pylori testing.      One non-bleeding superficial gastric ulcer with no stigmata of bleeding       was found in the gastric antrum. The lesion was 6 mm in largest       dimension.      One non-bleeding cratered duodenal ulcer with no stigmata of bleeding       was found in the duodenal bulb. The lesion was 9 mm in largest dimension.      Severe inflammation characterized by erythema and deformity from peptic       duodenitis was found in the duodenal bulb.      The second portion of the duodenum was normal. Impression:               - Normal esophagus.                           -  Gastritis. Biopsied to exclude H. Pylori (which                            was present in the past and unclear if fully                            treated).                           - Non-bleeding gastric ulcer with no stigmata of                            bleeding.                           - Non-bleeding duodenal ulcer with no stigmata of                            bleeding. This is the most likely source of recent                            GI bleeding.                           - Duodenitis.                           -  Normal second portion of the duodenum. Moderate Sedation:      N/A Recommendation:           - Patient has a contact number available for                            emergencies. The signs and symptoms of potential                            delayed complications were discussed with the                            patient. Return to normal activities tomorrow.                            Written discharge instructions were provided to the                            patient.                           - Resume previous diet.                           - Continue present medications.                           - BID PPI for at least 12 weeks, then once daily  thereafter.                           - Await pathology results. Will need H. Pylori                            therapy if positive.                           - No NSAIDs.                           - Can resume anticoagulation given high risk aortic                            valve. Will need close attention to Hgb. May                            eventually need iron replacement.                           - GI will sign off, but I will follow-up pathology                            and recommend treatment if positive. Call if                            questions. Procedure Code(s):        --- Professional ---                           [phone], Esophagogastroduodenoscopy, flexible,                            transoral; with biopsy, single or multiple Diagnosis Code(s):        --- Professional ---                           K29.70, Gastritis, unspecified, without bleeding                           K25.9, Gastric ulcer, unspecified as acute or                            chronic, without hemorrhage or perforation                           K26.9, Duodenal ulcer, unspecified as acute or                            chronic, without hemorrhage or perforation                           K29.80, Duodenitis without bleeding                            K92.1, Melena (includes Hematochezia)  CPT copyright 2019 [employer] Association. All rights reserved. The codes documented in this report are preliminary and upon coder review may  be revised to meet current compliance requirements. Beverley Fiedler, MD 07/24/2020 12:06:05 PM This report has been signed electronically. Number of Addenda: 0

## 2020-07-24 NOTE — Progress Notes (Signed)
ANTICOAGULATION CONSULT NOTE - Initial Consult  Pharmacy Consult for heparin Indication: mechanical mitral valve  Allergies  Allergen Reactions  . Strawberry Extract Rash    Rash & itching.    Patient Measurements: Height: 5\' 3"  (160 cm) Weight: 67.3 kg (148 lb 5.9 oz) IBW/kg (Calculated) : 52.4 Heparin Dosing Weight: 67.3 kg  Vital Signs: Temp: 98 F (36.7 C) (04/15 1849) Temp Source: Oral (04/15 1849) BP: 106/56 (04/15 1849) Pulse Rate: 84 (04/15 1849)  Labs: Recent Labs    07/22/20 0630 07/23/20 0058 07/24/20 0034 07/24/20 1926  HGB 10.8* 8.8* 7.5* 9.0*  HCT 32.9* 26.5* 22.8* 27.5*  PLT 105* 107* 120*  --   LABPROT 59.9* 38.6* 22.2*  --   INR 6.9* 4.0* 2.0*  --   HEPARINUNFRC  --   --   --  0.73*  CREATININE 1.29* 1.33* 1.35*  --     Estimated Creatinine Clearance: 32.7 mL/min (A) (by C-G formula based on SCr of 1.35 mg/dL (H)).   Medical History: Past Medical History:  Diagnosis Date  . AICD (automatic cardioverter/defibrillator) present   . Aortic stenosis, mild    mean gradient 07/26/20 echo 09/09  . CHF (congestive heart failure) (HCC)    --Non-ischemic CM EF 30-35% by echo 9/09 (Previous 25%)  --s/p St. Jude single chamber ICD 04/09 --Minimal  CAD by cath 2004 LAD 20% LCX 20% Ramus 30% RCA nl --echo 05/11 EF 20-25%  . Diabetes mellitus    type 2  . Hyperlipidemia   . Hypertension   . Kidney stones   . MR (congenital mitral regurgitation)    Severe MR s/p St. Jude mechanical MVR by 07/11 2005    Medications:  Medications Prior to Admission  Medication Sig Dispense Refill Last Dose  . aspirin EC 81 MG tablet Take 81 mg by mouth daily.   07/20/2020  . atorvastatin (LIPITOR) 40 MG tablet Take 40 mg by mouth daily.   07/20/2020  . bisacodyl (BISACODYL) 5 MG EC tablet Take 5 mg by mouth daily as needed for moderate constipation.   07/19/2020  . carvedilol (COREG) 25 MG tablet Take 25 mg by mouth 2 (two) times daily with a meal.   07/20/2020 at 0900   . HYDROcodone-acetaminophen (NORCO) 7.5-325 MG tablet Take 1 tablet by mouth 2 (two) times daily as needed for moderate pain.    07/20/2020  . ondansetron (ZOFRAN) 4 MG tablet Take 4 mg by mouth 3 (three) times daily as needed for nausea.   Past Week at Unknown time  . sacubitril-valsartan (ENTRESTO) 97-103 MG Take 1 tablet by mouth 2 (two) times daily. Last refill without office visit. please call [phone] 60 tablet 1 Past Week at Unknown time  . temazepam (RESTORIL) 15 MG capsule Take 15 mg by mouth at bedtime as needed for sleep.   Past Week at Unknown time  . tiZANidine (ZANAFLEX) 2 MG tablet Take 2 mg by mouth every 8 (eight) hours as needed for muscle spasms.   Past Week at Unknown time  . warfarin (COUMADIN) 5 MG tablet Take 1 to 1.5 tablets daily as directed by Coumadin clinic (Patient taking differently: Take 7.5 mg by mouth daily at 4 PM.) 135 tablet 1 07/19/2020 at 1800    Assessment: 75 yo female to start heparin while INR is subtherapeutic.  INR 2.0, Hg 7.5, PTLC 120   Patient s/p EGD with gastritis and non-bleeding ulcers. Heparin restarted after EGD. Post EGD heparin level 0.73. Targeting lower end of range  for risk of GI bleeding. Will decrease drip for now.   Goal of Therapy:  Heparin level 0.3-0.5 units/ml Monitor platelets by anticoagulation protocol: Yes   Plan:  Decrease Heparin drip to 900 units/hr Check heparin level in AM Daily HL and CBC while on heparin Monitor for bleeding complications  Chele Cornell A. Jeanella Craze, PharmD, BCPS, FNKF Clinical Pharmacist [place] Please utilize Amion for appropriate phone number to reach the unit pharmacist [facility])   07/24/2020,8:19 PM

## 2020-07-24 NOTE — Progress Notes (Signed)
   INR 2.0 at 0034 on 07/24/20, was 4.0 yesterday.   Will make her NPO now  Hopeful EGD later today   Hgb 7.5 this AM  Will transfuse one unit PRBC  Will ask pharmacy to start heparin no bolus -- Mechanical mitral valve. (Goal INR 2.5-3.5). This can be stopped one hour prior to EGD.   Will send secure chat to GI.  Donato Schultz, MD

## 2020-07-24 NOTE — Progress Notes (Signed)
Spoke with Endo, stop heparin gtt at this time for EGD to be performed this morning.

## 2020-07-24 NOTE — Progress Notes (Signed)
Patient arrived back to unit via bed. She is a&o x4. Still sleepy from anesthesia.  Respirations even and unlabored. Denies any pain or discomfort at this time. Heparin drip restarted at this time. Will continue to monitor. Call light and possessions in reach.

## 2020-07-24 NOTE — Progress Notes (Signed)
Attempted to start new IV site to left forearm x2, unsuccessful. Patient transporting off of unit at this time for EGD

## 2020-07-24 NOTE — Progress Notes (Addendum)
ANTICOAGULATION CONSULT NOTE - Initial Consult  Pharmacy Consult for heparin Indication: mechanical mitral valve  Allergies  Allergen Reactions  . Strawberry Extract Rash    Rash & itching.    Patient Measurements: Height: 5\' 3"  (160 cm) Weight: 67.3 kg (148 lb 5.9 oz) IBW/kg (Calculated) : 52.4 Heparin Dosing Weight: 67.3 kg  Vital Signs: Temp: 98.1 F (36.7 C) (04/15 0330) Temp Source: Oral (04/15 0330) BP: 113/60 (04/15 0530) Pulse Rate: 80 (04/15 0330)  Labs: Recent Labs    07/22/20 0630 07/23/20 0058 07/24/20 0034  HGB 10.8* 8.8* 7.5*  HCT 32.9* 26.5* 22.8*  PLT 105* 107* 120*  LABPROT 59.9* 38.6* 22.2*  INR 6.9* 4.0* 2.0*  CREATININE 1.29* 1.33* 1.35*    Estimated Creatinine Clearance: 32.7 mL/min (A) (by C-G formula based on SCr of 1.35 mg/dL (H)).   Medical History: Past Medical History:  Diagnosis Date  . Aortic stenosis, mild    mean gradient 07/26/20 echo 09/09  . CHF (congestive heart failure) (HCC)    --Non-ischemic CM EF 30-35% by echo 9/09 (Previous 25%)  --s/p St. Jude single chamber ICD 04/09 --Minimal  CAD by cath 2004 LAD 20% LCX 20% Ramus 30% RCA nl --echo 05/11 EF 20-25%  . Diabetes mellitus    type 2  . Hyperlipidemia   . Hypertension   . Kidney stones   . MR (congenital mitral regurgitation)    Severe MR s/p St. Jude mechanical MVR by 07/11 2005    Medications:  Medications Prior to Admission  Medication Sig Dispense Refill Last Dose  . aspirin EC 81 MG tablet Take 81 mg by mouth daily.   07/20/2020  . atorvastatin (LIPITOR) 40 MG tablet Take 40 mg by mouth daily.   07/20/2020  . bisacodyl (BISACODYL) 5 MG EC tablet Take 5 mg by mouth daily as needed for moderate constipation.   07/19/2020  . carvedilol (COREG) 25 MG tablet Take 25 mg by mouth 2 (two) times daily with a meal.   07/20/2020 at 0900  . HYDROcodone-acetaminophen (NORCO) 7.5-325 MG tablet Take 1 tablet by mouth 2 (two) times daily as needed for moderate pain.     07/20/2020  . ondansetron (ZOFRAN) 4 MG tablet Take 4 mg by mouth 3 (three) times daily as needed for nausea.   Past Week at Unknown time  . sacubitril-valsartan (ENTRESTO) 97-103 MG Take 1 tablet by mouth 2 (two) times daily. Last refill without office visit. please call [phone] 60 tablet 1 Past Week at Unknown time  . temazepam (RESTORIL) 15 MG capsule Take 15 mg by mouth at bedtime as needed for sleep.   Past Week at Unknown time  . tiZANidine (ZANAFLEX) 2 MG tablet Take 2 mg by mouth every 8 (eight) hours as needed for muscle spasms.   Past Week at Unknown time  . warfarin (COUMADIN) 5 MG tablet Take 1 to 1.5 tablets daily as directed by Coumadin clinic (Patient taking differently: Take 7.5 mg by mouth daily at 4 PM.) 135 tablet 1 07/19/2020 at 1800    Assessment: 75 yo female to start heparin while INR is subtherapeutic.  INR 2.0, Hg 7.5, PTLC 120 She does have GI bleed so MD requests no bolus.  Will also target lower end of therapeutic range Goal of Therapy:  Heparin level 0.3-0.5 units/ml Monitor platelets by anticoagulation protocol: Yes   Plan:  Heparin drip at 1000 units/hr Check heparin level 6-8 hours after start Daily HL and CBC while on heparin Monitor for bleeding  complications  Talbert Cage Poteet 07/24/2020,6:56 AM

## 2020-07-24 NOTE — Anesthesia Preprocedure Evaluation (Addendum)
Anesthesia Evaluation  Patient identified by MRN, date of birth, ID band Patient awake    Reviewed: Allergy & Precautions, NPO status , Patient's Chart, lab work & pertinent test results  Airway Mallampati: II  TM Distance: >3 FB Neck ROM: Full    Dental  (+) Dental Advisory Given, Poor Dentition, Missing, Partial Upper, Partial Lower   Pulmonary COPD, former smoker,    Pulmonary exam normal breath sounds clear to auscultation       Cardiovascular hypertension, Pt. on home beta blockers and Pt. on medications +CHF  + Cardiac Defibrillator  Rhythm:Regular Rate:Normal + Systolic murmurs Echo 3/32/9518 1. Left ventricular ejection fraction, by estimation, is 20 to 25%. The left ventricle has severely decreased function. The left ventricle demonstrates regional wall motion abnormalities.      All septal segments and the basal inferior segment appear akinetic. The rest of the LV walls appears severely hypokinetic. The left ventricular internal cavity size was moderately dilated. Diastolic function indeterminant due to mechanical mitral valve.  2. Right ventricular systolic function is normal. The right ventricular size is normal. There is moderately elevated pulmonary artery systolic pressure. The estimated right ventricular systolic pressure is 84.1 mmHg.  3. Left atrial size was severely dilated.  4. The mitral valve has been repaired/replaced. A mechanical St. Jude mitral valve is present and appears well seated with normal function by doppler interrogation. Mean gradient 52mHg at HR 86bpm. There is trivial mitral regurgitation.  5. The aortic valve is tricuspid. There is mild thickening of the aortic valve. Aortic valve regurgitation is not visualized. Mild aortic valve sclerosis is present, with no evidence of aortic valve stenosis.  6. The inferior vena cava is normal in size with greater than 50% respiratory variability,  suggesting right atrial pressure of 3 mmHg.   Neuro/Psych negative neurological ROS     GI/Hepatic negative GI ROS, Neg liver ROS,   Endo/Other  negative endocrine ROSdiabetes  Renal/GU Renal disease     Musculoskeletal negative musculoskeletal ROS (+)   Abdominal   Peds  Hematology  (+) Blood dyscrasia, anemia ,   Anesthesia Other Findings   Reproductive/Obstetrics                          Anesthesia Physical Anesthesia Plan  ASA: IV  Anesthesia Plan: MAC   Post-op Pain Management:    Induction: Intravenous  PONV Risk Score and Plan: 2 and Propofol infusion, Treatment may vary due to age or medical condition and Ondansetron  Airway Management Planned: Natural Airway  Additional Equipment: None  Intra-op Plan:   Post-operative Plan:   Informed Consent: I have reviewed the patients History and Physical, chart, labs and discussed the procedure including the risks, benefits and alternatives for the proposed anesthesia with the patient or authorized representative who has indicated his/her understanding and acceptance.     Dental advisory given  Plan Discussed with: CRNA  Anesthesia Plan Comments:        Anesthesia Quick Evaluation

## 2020-07-25 DIAGNOSIS — K922 Gastrointestinal hemorrhage, unspecified: Secondary | ICD-10-CM | POA: Diagnosis not present

## 2020-07-25 DIAGNOSIS — D649 Anemia, unspecified: Secondary | ICD-10-CM | POA: Diagnosis not present

## 2020-07-25 DIAGNOSIS — Z952 Presence of prosthetic heart valve: Secondary | ICD-10-CM | POA: Diagnosis not present

## 2020-07-25 LAB — CULTURE, BLOOD (ROUTINE X 2)
Culture: NO GROWTH
Culture: NO GROWTH

## 2020-07-25 LAB — GLUCOSE, CAPILLARY
Glucose-Capillary: 97 mg/dL (ref 70–99)
Glucose-Capillary: 121 mg/dL — ABNORMAL HIGH (ref 70–99)
Glucose-Capillary: 128 mg/dL — ABNORMAL HIGH (ref 70–99)
Glucose-Capillary: 130 mg/dL — ABNORMAL HIGH (ref 70–99)

## 2020-07-25 LAB — CBC
HCT: 25.8 % — ABNORMAL LOW (ref 36.0–46.0)
HCT: 30.1 % — ABNORMAL LOW (ref 36.0–46.0)
HCT: 30.1 % — ABNORMAL LOW (ref 36.0–46.0)
Hemoglobin: 8.7 g/dL — ABNORMAL LOW (ref 12.0–15.0)
Hemoglobin: 10 g/dL — ABNORMAL LOW (ref 12.0–15.0)
Hemoglobin: 9.9 g/dL — ABNORMAL LOW (ref 12.0–15.0)
MCH: 32.1 pg (ref 26.0–34.0)
MCH: 31.5 pg (ref 26.0–34.0)
MCH: 31.5 pg (ref 26.0–34.0)
MCHC: 33.7 g/dL (ref 30.0–36.0)
MCHC: 33.2 g/dL (ref 30.0–36.0)
MCHC: 32.9 g/dL (ref 30.0–36.0)
MCV: 95.2 fL (ref 80.0–100.0)
MCV: 95 fL (ref 80.0–100.0)
MCV: 95.9 fL (ref 80.0–100.0)
Platelets: 110 10*3/uL — ABNORMAL LOW (ref 150–400)
Platelets: 143 10*3/uL — ABNORMAL LOW (ref 150–400)
Platelets: 151 10*3/uL (ref 150–400)
RBC: 2.71 MIL/uL — ABNORMAL LOW (ref 3.87–5.11)
RBC: 3.17 MIL/uL — ABNORMAL LOW (ref 3.87–5.11)
RBC: 3.14 MIL/uL — ABNORMAL LOW (ref 3.87–5.11)
RDW: 15.8 % — ABNORMAL HIGH (ref 11.5–15.5)
RDW: 16 % — ABNORMAL HIGH (ref 11.5–15.5)
RDW: 15.9 % — ABNORMAL HIGH (ref 11.5–15.5)
WBC: 10.3 10*3/uL (ref 4.0–10.5)
WBC: 12.1 10*3/uL — ABNORMAL HIGH (ref 4.0–10.5)
WBC: 12.8 10*3/uL — ABNORMAL HIGH (ref 4.0–10.5)
nRBC: 0 % (ref 0.0–0.2)
nRBC: 0 % (ref 0.0–0.2)
nRBC: 0.2 % (ref 0.0–0.2)

## 2020-07-25 LAB — TYPE AND SCREEN
ABO/RH(D): O POS
Antibody Screen: NEGATIVE
Unit division: 0

## 2020-07-25 LAB — BPAM RBC
Blood Product Expiration Date: 202205162359
ISSUE DATE / TIME: 202204151525
Unit Type and Rh: 5100

## 2020-07-25 LAB — BASIC METABOLIC PANEL
Anion gap: 2 — ABNORMAL LOW (ref 5–15)
BUN: 35 mg/dL — ABNORMAL HIGH (ref 8–23)
CO2: 28 mmol/L (ref 22–32)
Calcium: 8.1 mg/dL — ABNORMAL LOW (ref 8.9–10.3)
Chloride: 111 mmol/L (ref 98–111)
Creatinine, Ser: 1.16 mg/dL — ABNORMAL HIGH (ref 0.44–1.00)
GFR, Estimated: 49 mL/min — ABNORMAL LOW (ref >60)
Glucose, Bld: 85 mg/dL (ref 70–99)
Potassium: 4.2 mmol/L (ref 3.5–5.1)
Sodium: 141 mmol/L (ref 135–145)

## 2020-07-25 LAB — PROTIME-INR
INR: 1.3 — ABNORMAL HIGH (ref 0.8–1.2)
Prothrombin Time: 16.6 seconds — ABNORMAL HIGH (ref 11.4–15.2)

## 2020-07-25 LAB — HEMOGLOBIN AND HEMATOCRIT, BLOOD
HCT: 26 % — ABNORMAL LOW (ref 36.0–46.0)
Hemoglobin: 8.7 g/dL — ABNORMAL LOW (ref 12.0–15.0)

## 2020-07-25 LAB — HEPARIN LEVEL (UNFRACTIONATED)
Heparin Unfractionated: 1.04 IU/mL — ABNORMAL HIGH (ref 0.30–0.70)
Heparin Unfractionated: 0.63 IU/mL (ref 0.30–0.70)

## 2020-07-25 MED ORDER — INSULIN ASPART 100 UNIT/ML ~~LOC~~ SOLN: 0.0000 [IU] | Freq: Three times a day (TID) | SUBCUTANEOUS | Status: DC

## 2020-07-25 MED ORDER — HEPARIN (PORCINE) 25000 UT/250ML-% IV SOLN
600.0000 [IU]/h | INTRAVENOUS | Status: DC
  Administered 2020-07-25: 650 [IU]/h via INTRAVENOUS
  Administered 2020-07-25: 700 [IU]/h via INTRAVENOUS
  Administered 2020-07-27: 650 [IU]/h via INTRAVENOUS
  Administered 2020-07-29 – 2020-07-31 (×2): 600 [IU]/h via INTRAVENOUS
  Filled 2020-07-25 (×5): qty 250

## 2020-07-25 MED ORDER — INSULIN ASPART 100 UNIT/ML ~~LOC~~ SOLN: 0.0000 [IU] | Freq: Every day | SUBCUTANEOUS | Status: DC

## 2020-07-25 MED ORDER — ATORVASTATIN CALCIUM 40 MG PO TABS
40.0000 mg | ORAL_TABLET | Freq: Every day | ORAL | Status: DC
  Administered 2020-07-25 – 2020-07-31 (×7): 40 mg via ORAL
  Filled 2020-07-25 (×6): qty 1

## 2020-07-25 MED ORDER — CARVEDILOL 6.25 MG PO TABS
6.2500 mg | ORAL_TABLET | Freq: Two times a day (BID) | ORAL | Status: DC
  Administered 2020-07-25 – 2020-07-31 (×12): 6.25 mg via ORAL
  Filled 2020-07-25 (×13): qty 1

## 2020-07-25 MED ORDER — WARFARIN - PHARMACIST DOSING INPATIENT: Freq: Every day | Status: DC

## 2020-07-25 MED ORDER — WARFARIN SODIUM 7.5 MG PO TABS
7.5000 mg | ORAL_TABLET | Freq: Once | ORAL | Status: AC
  Administered 2020-07-25: 7.5 mg via ORAL
  Filled 2020-07-25: qty 1

## 2020-07-25 NOTE — Evaluation (Signed)
Physical Therapy Evaluation Patient Details Name: Deborah [name] MRN: [id number] DOB: 07/25/1945 Today's Date: 07/25/2020   History of Present Illness  75 y.o. female  who is admitted to [facility] on 07/20/2020 with acute upper gastrointestinal bleed after presenting 75from home to [facility] ED complaining of nausea and vomiting. PMH: chronic systolic heart failure with most recent EF 20 to 25% per echocardiogram in March 2020, severe mitral regurgitation status post mechanical mitral valve replacement 2005, type 2 diabetes mellitus, hypertension, anemia of chronic disease with baseline hemoglobin of 11,  Clinical Impression  PTA pt living in single story apartment with level entry. Pt was independent in all mobility at community level with no AD, and independent in bADLs and iADLs. Pt's cousin provides transportation. Pt is currently limited in safe mobility by generalized weakness and associated decreased balance. Pt is supervision for transfers and minA for ambulation without AD and min guard for ambulation with RW. Pt agrees to RW use at home until strength returns. PT recommending HHPT at discharge to return to PLOF. PT will continue to follow acutely.     Follow Up Recommendations Home health PT;Supervision - Intermittent    Equipment Recommendations  None recommended by PT (pt has necessary equipment)       Precautions / Restrictions Precautions Precautions: Fall Restrictions Weight Bearing Restrictions: No      Mobility  Bed Mobility               General bed mobility comments: OOB in recliner    Transfers Overall transfer level: Needs assistance Equipment used: None Transfers: Sit to/from Stand Sit to Stand: Supervision         General transfer comment: supervision for safety, pt stood for a little while before starting ambulation  Ambulation/Gait Ambulation/Gait assistance: Min guard;Min assist Gait Distance (Feet): 150 Feet Assistive device: Rolling walker  (2 wheeled);None;IV Pole Gait Pattern/deviations: Step-through pattern;Decreased step length - right;Decreased step length - left;Shuffle Gait velocity: slowed Gait velocity interpretation: <1.8 ft/sec, indicate of risk for recurrent falls General Gait Details: min a for ambulation without AD in room, pt switched to pushing IV pole continuing to need light min A for steadying, when provided with RW at door pt able to ambulate with min guard for safety, vc for proximity to RW      Balance Overall balance assessment: Needs assistance Sitting-balance support: Feet supported;No upper extremity supported Sitting balance-Leahy Scale: Good     Standing balance support: No upper extremity supported Standing balance-Leahy Scale: Fair Standing balance comment: needs UE support for dynamic balance                             Pertinent Vitals/Pain Pain Assessment: No/denies pain    Home Living Family/patient expects to be discharged to:: Private residence Living Arrangements: Alone Available Help at Discharge: Family;Available 24 hours/day (cousin can come stay with her) Type of Home: Apartment Home Access: Level entry     Home Layout: One level Home Equipment: Walker - 2 wheels;Grab bars - tub/shower;Hand held shower head      Prior Function Level of Independence: Independent         Comments: cousin provides transportation, able to ambulate community distances without AD     Hand Dominance        Extremity/Trunk Assessment   Upper Extremity Assessment Upper Extremity Assessment: Generalized weakness    Lower Extremity Assessment Lower Extremity Assessment: Generalized weakness  Cervical / Trunk Assessment Cervical / Trunk Assessment: Kyphotic  Communication   Communication: No difficulties  Cognition Arousal/Alertness: Awake/alert Behavior During Therapy: WFL for tasks assessed/performed Overall Cognitive Status: Within Functional Limits for tasks  assessed                                        General Comments General comments (skin integrity, edema, etc.): increase in HR to max noted 124bpm with ambulation, SaO2 on RA >90%O2 when pleth wave form appropriate        Assessment/Plan    PT Assessment Patient needs continued PT services  PT Problem List Decreased balance;Decreased strength;Cardiopulmonary status limiting activity       PT Treatment Interventions Gait training;DME instruction;Functional mobility training;Therapeutic activities;Therapeutic exercise;Balance training;Cognitive remediation;Patient/family education    PT Goals (Current goals can be found in the Care Plan section)  Acute Rehab PT Goals Patient Stated Goal: be able to walk for pleasure PT Goal Formulation: With patient Time For Goal Achievement: 08/08/20 Potential to Achieve Goals: Good    Frequency Min 3X/week    AM-PAC PT "6 Clicks" Mobility  Outcome Measure Help needed turning from your back to your side while in a flat bed without using bedrails?: None Help needed moving from lying on your back to sitting on the side of a flat bed without using bedrails?: None Help needed moving to and from a bed to a chair (including a wheelchair)?: None Help needed standing up from a chair using your arms (e.g., wheelchair or bedside chair)?: None Help needed to walk in hospital room?: A Little Help needed climbing 3-5 steps with a railing? : A Little 6 Click Score: 22    End of Session Equipment Utilized During Treatment: Gait belt Activity Tolerance: Patient tolerated treatment well Patient left: in chair;with call bell/phone within reach;Other (comment) (set up for lunch) Nurse Communication: Mobility status PT Visit Diagnosis: Muscle weakness (generalized) (M62.81);Difficulty in walking, not elsewhere classified (R26.2)    Time: 9470-9628 PT Time Calculation (min) (ACUTE ONLY): 24 min   Charges:   PT Evaluation $PT Eval  Moderate Complexity: 1 Mod PT Treatments $Gait Training: 8-22 mins        Zriyah Kopplin B. Beverely Risen PT, DPT Acute Rehabilitation Services Pager [phone] Office [phone]   Elon Alas Fleet 07/25/2020, 1:41 PM

## 2020-07-25 NOTE — Progress Notes (Signed)
ANTICOAGULATION CONSULT NOTE  Pharmacy Consult for heparin Indication: mechanical mitral valve  Labs: Recent Labs    07/23/20 0058 07/24/20 0034 07/24/20 1926 07/25/20 0014 07/25/20 0324 07/25/20 0809 07/25/20 1558  HGB 8.8* 7.5* 9.0* 8.7* 8.7* 10.0* 9.9*  HCT 26.5* 22.8* 27.5* 25.8* 26.0* 30.1* 30.1*  PLT 107* 120*  --  110*  --  143* 151  APTT  --   --  132*  --   --   --   --   LABPROT 38.6* 22.2* 17.1* 16.6*  --   --   --   INR 4.0* 2.0* 1.4* 1.3*  --   --   --   HEPARINUNFRC  --   --  0.73* 1.04*  --   --  0.63  CREATININE 1.33* 1.35*  --  1.16*  --   --   --    Assessment: 75 yo F on warfarin PTA (Per med rec: patient takes warfarin 7.5mg  PO every day) to start heparin drip while INR is subtherapeutic. Patient s/p EGD with gastritis and non-bleeding ulcers. Heparin restarted after EGD.   Pt had 1 episode of tarry stool and heparin was held 4/16 at  0334. Received request from MD to restart heparin infusion for anticoagulation. Will continue to aim for low 0.3-0.5 with no bolus.    Heparin level slightly above lower goals as above, s/p restart on 700 units/hr  Goal of Therapy:  Heparin level 0.3-0.5 units/ml INR goal 2.5-3.5 for warfarin therapy Monitor platelets by anticoagulation protocol: Yes   Plan:  Decrease heparin gtt to 650 units/hr F/u heparin level with AM labs  Daylene Posey, PharmD Clinical Pharmacist ED Pharmacist Phone # [phone] 07/25/2020 5:01 PM

## 2020-07-25 NOTE — Progress Notes (Signed)
Progress Note  Patient Name: Deborah [name] Date of Encounter: 07/25/2020  [facility] Cardiologist: Arvilla Meres, MD  [facility]  EP  Graciela Husbands  Subjective   Breathing is OK  She denies CP    Inpatient Medications    Scheduled Meds: . insulin aspart  0-6 Units Subcutaneous Q6H  . pantoprazole  40 mg Oral BID   Continuous Infusions: . heparin 700 Units/hr (07/25/20 0800)   PRN Meds: acetaminophen, ondansetron (ZOFRAN) IV, polyethylene glycol   Vital Signs    Vitals:   07/24/20 2059 07/24/20 2318 07/25/20 0357 07/25/20 0729  BP: (!) 99/49 113/63 108/77 (!) 105/51  Pulse: 77 82 73 72  Resp: 20 20 17 20   Temp: 98 F (36.7 C) 98.6 F (37 C) 98.8 F (37.1 C) 98.5 F (36.9 C)  TempSrc: Axillary Oral Oral Oral  SpO2: 98% 100% 100% 92%  Weight:   68.9 kg   Height:        Intake/Output Summary (Last 24 hours) at 07/25/2020 1021 Last data filed at 07/25/2020 0325 Gross per 24 hour  Intake 1292.86 ml  Output 250 ml  Net 1042.86 ml   Last 3 Weights 07/25/2020 07/24/2020 07/24/2020  Weight (lbs) 151 lb 14.4 oz 148 lb 5.9 oz 148 lb 5.9 oz  Weight (kg) 68.9 kg 67.3 kg 67.3 kg       Physical Exam   GEN: No acute distress.   Neck: No JVD Cardiac: RRR, S1 click , no murmurs.  Respiratory: Clear to auscultation bilaterally. GI: Soft, nontender, non-distended  MS: No edema; No deformity. Neuro:  Nonfocal  Psych: Normal affect   Labs    High Sensitivity Troponin:  No results for input(s): TROPONINIHS in the last 720 hours.    Chemistry Recent Labs  Lab 07/20/20 1831 07/21/20 0929 07/22/20 0630 07/23/20 0058 07/24/20 0034 07/25/20 0014  NA 139 144   < > 141 138 141  K 4.2 4.2   < > 4.0 4.2 4.2  CL 108 114*   < > 111 110 111  CO2 24 23   < > 27 28 28   GLUCOSE 271* 188*   < > 108* 106* 85  BUN 77* 64*   < > 50* 49* 35*  CREATININE 1.88* 1.60*   < > 1.33* 1.35* 1.16*  CALCIUM 8.4* 8.2*   < > 8.0* 7.9* 8.1*  PROT 5.4* 5.0*  --   --   --   --   ALBUMIN 3.1*  3.0*  --   --   --   --   AST 14* 16  --   --   --   --   ALT 15 13  --   --   --   --   ALKPHOS 38 35*  --   --   --   --   BILITOT 0.5 0.7  --   --   --   --   GFRNONAA 27* 33*   < > 41* 41* 49*  ANIONGAP 7 7   < > 3* 0* 2*   < > = values in this interval not displayed.     Hematology Recent Labs  Lab 07/24/20 0034 07/24/20 1926 07/25/20 0014 07/25/20 0324 07/25/20 0809  WBC 9.7  --  10.3  --  12.1*  RBC 2.37*  --  2.71*  --  3.17*  HGB 7.5*   < > 8.7* 8.7* 10.0*  HCT 22.8*   < > 25.8* 26.0* 30.1*  MCV 96.2  --  95.2  --  95.0  MCH 31.6  --  32.1  --  31.5  MCHC 32.9  --  33.7  --  33.2  RDW 15.6*  --  15.8*  --  16.0*  PLT 120*  --  110*  --  143*   < > = values in this interval not displayed.    BNPNo results for input(s): BNP, PROBNP in the last 168 hours.   DDimer No results for input(s): DDIMER in the last 168 hours.   Radiology    No results found.  Cardiac Studies    ECHO 07/22/20   1. Left ventricular ejection fraction, by estimation, is 20 to 25%. The  left ventricle has severely decreased function. The left ventricle  demonstrates regional wall motion abnormalities.      All septal segments and the basal inferior segment appear akinetic.  The rest of the LV walls appears severely hypokinetic. The left  ventricular internal cavity size was moderately dilated. Diastolic  function indeterminant due to mechanical mitral  valve.  2. Right ventricular systolic function is normal. The right ventricular  size is normal. There is moderately elevated pulmonary artery systolic  pressure. The estimated right ventricular systolic pressure is 45.8 mmHg.  3. Left atrial size was severely dilated.  4. The mitral valve has been repaired/replaced. A mechanical St. Jude  mitral valve is present and appears well seated with normal function by  doppler interrogation. Mean gradient at HR 86bpm. There is trivial  mitral regurgitation.  5. The aortic  valve is tricuspid. There is mild thickening of the aortic  valve. Aortic valve regurgitation is not visualized. Mild aortic valve  sclerosis is present, with no evidence of aortic valve stenosis.  6. The inferior vena cava is normal in size with greater than 50%  respiratory variability, suggesting right atrial pressure of 3 mmHg.   Comparison(s): No significant change from prior study.   Patient Profile      Deborah [name] is a 75 y.o. female with a hx of NICM w/ EF 25%, STJ singe chamber ICD, STJ MVR 2005 on coumadin, DM, HTN, HLD, nephrolithiasis, S-CHF, mild AS,ABL anemia requiring 3 PRBCs 2016 w/ gastric and duodenal ulcers on EGD (INR 4.5), who is being seen today for the management of anticoagulation at the request of Dr Lucianne Muss.   Assessment & Plan    1  MV dz  S/p mechanical MV   Valve sounds good on exam  Gradient 4 mm Hg Currently on IV heparin   COumadin has not been restarted due to large BM last night (dark) IF H/H stable later today  And no further bleeding I would resume tonight COntinue IV heparin    2  Chronic systolic CHF  Echo noted above     Had been on Coreg 25 bid and Entresto 97/103 bid  I would follow BP tody  HOld for now      3 GI   Endoscopy showed gastritis, nonbleeding ulcers in stomach and duodenum, duodenitis   REcomm BID PPI   PEr their note  OK to resume anticoagulation with close f/u of H/H    4 Anemia   REcheck CBC this PM  5 HL  Had been on lipitor   REsume       For questions or updates, please contact [facility] Please consult [email or web address] for contact info under        Signed, Dietrich Pates, MD  07/25/2020, 10:21 AM

## 2020-07-25 NOTE — Progress Notes (Signed)
PROGRESS NOTE        PATIENT DETAILS Name: FLORASTINE MECKES Age: 75 y.o. Sex: female Date of Birth: [birth year] Admit Date: 07/20/2020 Admitting Physician Angie Fava, DO VZD:GLOVFIEPPIRJ, Campbell Lerner, MD  Brief Narrative: Patient is a 75 y.o. female with history of chronic systolic heart failure, mitral valve replacement (mechanical valve)-prior history of GI bleeding due to gastric/duodenal ulcers-presenting with dark-colored FOBT positive stools and a hemoglobin of 6.9-in the setting of supratherapeutic INR.  See below for further details.  Significant events: 4/11>> admit for upper GI bleeding-acute blood loss anemia-supratherapeutic INR. 4/15>> EGD  Significant studies: 4/11>> chest x-ray: Possible airspace disease in the left base 4/13>> Echo: EF 20-25%, old septal segment/basal inferior wall appears akinetic, rest of the LV wall severely hypokinetic.  Antimicrobial therapy: Vancomycin: 4/11 x 1 Flagyl: 4/11 x 1 Cefepime: 4/11 x 1  Microbiology data: 4/11>> blood culture: No growth 4/11>> Covid/influenza PCR: Negative  Procedures : 4/15>> EGD: Nonbleeding gastric ulcer, nonbleeding duodenal ulcer.  Duodenitis  Consults: GI, cardiology  DVT Prophylaxis : SCDs Start: 07/20/20 2158  IV heparin   Subjective: Finally had a BM overnight-black in color-likely reflecting old blood.  No chest pain or shortness of breath.  Feels better than the past few days.  Assessment/Plan: Upper GI bleeding with acute blood loss anemia: Had what looks like melena last night but suspect this is old blood that is making its way out (first BM since hospitalization)-hemoglobin remains stable.  Remains on PPI-s/p EGD on 4/15-see results above.  Has received numerous units of PRBC so far.  Continue to follow CBC-seems to be tolerating resumption of IV heparin fairly well.  Given stability in hemoglobin-low risk EGD findings-we will go ahead and start Coumadin starting  today.  Continue inpatient monitoring.  Supratherapeutic INR: Has mechanical mitral valve-INR was allowed to drift down-now within normal range.  AKI: Hemodynamically mediated-slowly improving-follow periodically.  Thrombocytopenia: Appears to be mild-improving-follow closely.  Chronic systolic heart failure: Euvolemic-we will start low-dose beta-blocker-over the next few days if blood pressure permits-we will resume Entresto.   History of severe mitral regurgitation-s/p mechanical mitral valve replacement: Currently on IV heparin-suspect that Coumadin can be resumed-we will watch closely over the next few days for any recurrence of GI bleeding.  Obesity: Estimated body mass index is 26.91 kg/m as calculated from the following:   Height as of this encounter: 5\' 3"  (1.6 m).   Weight as of this encounter: 68.9 kg.    Diet: Diet Order            Diet heart healthy/carb modified Room service appropriate? No; Fluid consistency: Thin  Diet effective now                  Code Status: Full code   Family Communication: Sister-Norma [phone] voicemail on 4/14, 4/15,4/16  Disposition Plan: Status is: Inpatient  Remains inpatient appropriate because:Inpatient level of care appropriate due to severity of illness   Dispo: The patient is from: Home              Anticipated d/c is to: Home              Patient currently is not medically stable to d/c.   Difficult to place patient No    Barriers to Discharge: Awaiting EGD once INR is acceptable.  Antimicrobial agents: Anti-infectives (From admission, onward)  Start     Dose/Rate Route Frequency Ordered Stop   07/21/20 2000  ceFEPIme (MAXIPIME) 2 g in sodium chloride 0.9 % 100 mL IVPB  Status:  Discontinued        2 g 200 mL/hr over 30 Minutes Intravenous Every 24 hours 07/20/20 2037 07/20/20 2156   07/21/20 2000  ceFEPIme (MAXIPIME) 2 g in sodium chloride 0.9 % 100 mL IVPB  Status:  Discontinued        2 g 200 mL/hr  over 30 Minutes Intravenous Every 24 hours 07/21/20 0234 07/21/20 1604   07/21/20 0330  vancomycin (VANCOREADY) IVPB 750 mg/150 mL  Status:  Discontinued        750 mg 150 mL/hr over 60 Minutes Intravenous Every 48 hours 07/21/20 0234 07/21/20 1604   07/20/20 1915  ceFEPIme (MAXIPIME) 2 g in sodium chloride 0.9 % 100 mL IVPB        2 g 200 mL/hr over 30 Minutes Intravenous  Once 07/20/20 1905 07/20/20 2029   07/20/20 1915  metroNIDAZOLE (FLAGYL) IVPB 500 mg        500 mg 100 mL/hr over 60 Minutes Intravenous  Once 07/20/20 1905 07/20/20 2137       Time spent: 25- minutes-Greater than 50% of this time was spent in counseling, explanation of diagnosis, planning of further management, and coordination of care.  MEDICATIONS: Scheduled Meds: . insulin aspart  0-6 Units Subcutaneous Q6H  . pantoprazole  40 mg Oral BID   Continuous Infusions: . heparin 700 Units/hr (07/25/20 0800)   PRN Meds:.acetaminophen, ondansetron (ZOFRAN) IV, polyethylene glycol   PHYSICAL EXAM: Vital signs: Vitals:   07/24/20 2059 07/24/20 2318 07/25/20 0357 07/25/20 0729  BP: (!) 99/49 113/63 108/77 (!) 105/51  Pulse: 77 82 73 72  Resp: 20 20 17 20   Temp: 98 F (36.7 C) 98.6 F (37 C) 98.8 F (37.1 C) 98.5 F (36.9 C)  TempSrc: Axillary Oral Oral Oral  SpO2: 98% 100% 100% 92%  Weight:   68.9 kg   Height:       Filed Weights   07/24/20 0500 07/24/20 1006 07/25/20 0357  Weight: 67.3 kg 67.3 kg 68.9 kg   Body mass index is 26.91 kg/m.   Gen Exam:Alert awake-not in any distress HEENT:atraumatic, normocephalic Chest: B/L clear to auscultation anteriorly CVS:S1S2 regular Abdomen:soft non tender, non distended Extremities:no edema Neurology: Non focal Skin: no rash  I have personally reviewed following labs and imaging studies  LABORATORY DATA: CBC: Recent Labs  Lab 07/20/20 1831 07/21/20 0131 07/21/20 0929 07/21/20 1134 07/22/20 0630 07/23/20 0058 07/24/20 0034 07/24/20 1926  07/25/20 0014 07/25/20 0324 07/25/20 0809  WBC 10.8*  --  11.7*   < > 8.9 8.6 9.7  --  10.3  --  12.1*  NEUTROABS 9.2*  --  9.3*  --   --   --   --   --   --   --   --   HGB 6.9*   < > 12.2  12.3   < > 10.8* 8.8* 7.5* 9.0* 8.7* 8.7* 10.0*  HCT 22.0*   < > 36.3  36.5   < > 32.9* 26.5* 22.8* 27.5* 25.8* 26.0* 30.1*  MCV 100.9*  --  92.1   < > 93.7 95.3 96.2  --  95.2  --  95.0  PLT 177  --  107*   < > 105* 107* 120*  --  110*  --  143*   < > = values in this interval  not displayed.    Basic Metabolic Panel: Recent Labs  Lab 07/21/20 0929 07/22/20 0630 07/23/20 0058 07/24/20 0034 07/25/20 0014  NA 144 144 141 138 141  K 4.2 4.3 4.0 4.2 4.2  CL 114* 112* 111 110 111  CO2 23 28 27 28 28   GLUCOSE 188* 112* 108* 106* 85  BUN 64* 52* 50* 49* 35*  CREATININE 1.60* 1.29* 1.33* 1.35* 1.16*  CALCIUM 8.2* 8.4* 8.0* 7.9* 8.1*  MG 1.7  1.8 1.7  --   --   --   PHOS  --  2.6  --   --   --     GFR: Estimated Creatinine Clearance: 38.4 mL/min (A) (by C-G formula based on SCr of 1.16 mg/dL (H)).  Liver Function Tests: Recent Labs  Lab 07/20/20 1831 07/21/20 0929  AST 14* 16  ALT 15 13  ALKPHOS 38 35*  BILITOT 0.5 0.7  PROT 5.4* 5.0*  ALBUMIN 3.1* 3.0*   Recent Labs  Lab 07/20/20 1831  LIPASE 34   No results for input(s): AMMONIA in the last 168 hours.  Coagulation Profile: Recent Labs  Lab 07/22/20 0630 07/23/20 0058 07/24/20 0034 07/24/20 1926 07/25/20 0014  INR 6.9* 4.0* 2.0* 1.4* 1.3*    Cardiac Enzymes: No results for input(s): CKTOTAL, CKMB, CKMBINDEX, TROPONINI in the last 168 hours.  BNP (last 3 results) No results for input(s): PROBNP in the last 8760 hours.  Lipid Profile: No results for input(s): CHOL, HDL, LDLCALC, TRIG, CHOLHDL, LDLDIRECT in the last 72 hours.  Thyroid Function Tests: No results for input(s): TSH, T4TOTAL, FREET4, T3FREE, THYROIDAB in the last 72 hours.  Anemia Panel: No results for input(s): VITAMINB12, FOLATE, FERRITIN,  TIBC, IRON, RETICCTPCT in the last 72 hours.  Urine analysis:    Component Value Date/Time   COLORURINE YELLOW 07/20/2020 2355   APPEARANCEUR HAZY (A) 07/20/2020 2355   LABSPEC 1.014 07/20/2020 2355   PHURINE 5.0 07/20/2020 2355   GLUCOSEU NEGATIVE 07/20/2020 2355   HGBUR LARGE (A) 07/20/2020 2355   BILIRUBINUR NEGATIVE 07/20/2020 2355   KETONESUR 5 (A) 07/20/2020 2355   PROTEINUR NEGATIVE 07/20/2020 2355   UROBILINOGEN 1.0 05/10/2011 1440   NITRITE NEGATIVE 07/20/2020 2355   LEUKOCYTESUR TRACE (A) 07/20/2020 2355    Sepsis Labs: Lactic Acid, Venous    Component Value Date/Time   LATICACIDVEN 1.8 07/21/2020 0107    MICROBIOLOGY: Recent Results (from the past 240 hour(s))  Urine culture     Status: None   Collection Time: 07/20/20  7:05 PM   Specimen: In/Out Cath Urine  Result Value Ref Range Status   Specimen Description IN/OUT CATH URINE  Final   Special Requests NONE  Final   Culture   Final    NO GROWTH Performed at [facility], [address] [place], [place]    Report Status 07/23/2020 FINAL  Final  Blood Culture (routine x 2)     Status: None (Preliminary result)   Collection Time: 07/20/20  7:33 PM   Specimen: BLOOD  Result Value Ref Range Status   Specimen Description BLOOD SITE NOT SPECIFIED  Final   Special Requests   Final    BOTTLES DRAWN AEROBIC AND ANAEROBIC Blood Culture results may not be optimal due to an inadequate volume of blood received in culture bottles   Culture   Final    NO GROWTH 4 DAYS Performed at [facility], [address], [place], [place]    Report Status PENDING  Incomplete  Resp Panel by RT-PCR (Flu A&B, Covid) Nasopharyngeal Swab     Status: None   Collection Time: 07/20/20  7:46 PM   Specimen: Nasopharyngeal Swab; Nasopharyngeal(NP) swabs in vial transport medium  Result Value Ref Range Status   SARS Coronavirus 2 by RT PCR NEGATIVE NEGATIVE Final    Comment: (NOTE) SARS-CoV-2 target  nucleic acids are NOT DETECTED.  The SARS-CoV-2 RNA is generally detectable in upper respiratory specimens during the acute phase of infection. The lowest concentration of SARS-CoV-2 viral copies this assay can detect is 138 copies/mL. A negative result does not preclude SARS-Cov-2 infection and should not be used as the sole basis for treatment or other patient management decisions. A negative result may occur with  improper specimen collection/handling, submission of specimen other than nasopharyngeal swab, presence of viral mutation(s) within the areas targeted by this assay, and inadequate number of viral copies(<138 copies/mL). A negative result must be combined with clinical observations, patient history, and epidemiological information. The expected result is Negative.  Fact Sheet for Patients:  BloggerCourse.com  Fact Sheet for Healthcare Providers:  SeriousBroker.it  This test is no t yet approved or cleared by the [place] FDA and  has been authorized for detection and/or diagnosis of SARS-CoV-2 by FDA under an Emergency Use Authorization (EUA). This EUA will remain  in effect (meaning this test can be used) for the duration of the COVID-19 declaration under Section 564(b)(1) of the Act, 21 U.S.C.section 360bbb-3(b)(1), unless the authorization is terminated  or revoked sooner.       Influenza A by PCR NEGATIVE NEGATIVE Final   Influenza B by PCR NEGATIVE NEGATIVE Final    Comment: (NOTE) The Xpert Xpress SARS-CoV-2/FLU/RSV plus assay is intended as an aid in the diagnosis of influenza from Nasopharyngeal swab specimens and should not be used as a sole basis for treatment. Nasal washings and aspirates are unacceptable for Xpert Xpress SARS-CoV-2/FLU/RSV testing.  Fact Sheet for Patients: BloggerCourse.com  Fact Sheet for Healthcare  Providers: SeriousBroker.it  This test is not yet approved or cleared by the [place] FDA and has been authorized for detection and/or diagnosis of SARS-CoV-2 by FDA under an Emergency Use Authorization (EUA). This EUA will remain in effect (meaning this test can be used) for the duration of the COVID-19 declaration under Section 564(b)(1) of the Act, 21 U.S.C. section 360bbb-3(b)(1), unless the authorization is terminated or revoked.  Performed at [facility], [address], [place], [place] [address]   Blood Culture (routine x 2)     Status: None (Preliminary result)   Collection Time: 07/20/20  7:47 PM   Specimen: BLOOD  Result Value Ref Range Status   Specimen Description BLOOD SITE NOT SPECIFIED  Final   Special Requests   Final    BOTTLES DRAWN AEROBIC AND ANAEROBIC Blood Culture results may not be optimal due to an inadequate volume of blood received in culture bottles   Culture   Final    NO GROWTH 4 DAYS Performed at [facility], [address], [place], [place] [address]    Report Status PENDING  Incomplete  MRSA PCR Screening     Status: None   Collection Time: 07/24/20  7:14 AM   Specimen: Nasal Mucosa; Nasopharyngeal  Result Value Ref Range Status   MRSA by PCR NEGATIVE NEGATIVE Final    Comment:        The GeneXpert MRSA Assay (FDA approved for NASAL specimens only), is one component of  a comprehensive MRSA colonization surveillance program. It is not intended to diagnose MRSA infection nor to guide or monitor treatment for MRSA infections. Performed at [facility], [address] [place], [place] [address]     RADIOLOGY STUDIES/RESULTS: No results found.   LOS: 5 days   Jeoffrey MassedShanker Sevana Grandinetti, MD  [facility]    To contact the attending provider between 7A-7P or the covering provider during after hours 7P-7A, please log into the web site [email or web address] and access using universal [place]  password for that web site. If you do not have the password, please call the hospital operator.  07/25/2020, 10:34 AM

## 2020-07-25 NOTE — Progress Notes (Addendum)
ANTICOAGULATION CONSULT NOTE  Pharmacy Consult for heparin Indication: mechanical mitral valve  Labs: Recent Labs    07/23/20 0058 07/24/20 0034 07/24/20 1926 07/25/20 0014 07/25/20 0324 07/25/20 0809  HGB 8.8* 7.5* 9.0* 8.7* 8.7* 10.0*  HCT 26.5* 22.8* 27.5* 25.8* 26.0* 30.1*  PLT 107* 120*  --  110*  --  143*  APTT  --   --  132*  --   --   --   LABPROT 38.6* 22.2* 17.1* 16.6*  --   --   INR 4.0* 2.0* 1.4* 1.3*  --   --   HEPARINUNFRC  --   --  0.73* 1.04*  --   --   CREATININE 1.33* 1.35*  --  1.16*  --   --    Assessment: 75 yo F on warfarin PTA (Per med rec: patient takes warfarin 7.5mg  PO every day) to start heparin drip while INR is subtherapeutic. Patient s/p EGD with gastritis and non-bleeding ulcers. Heparin restarted after EGD.   Pt had 1 episode of tarry stool and heparin was held 4/16 at  0334. Received request from MD to restart heparin infusion for anticoagulation. Will continue to aim for low 0.3-0.5 with no bolus.   Last HL (supratherapeutic) 1.04 at 900 units / hr   CBC: hgb 8.7, plt 110   Goal of Therapy:  Heparin level 0.3-0.5 units/ml INR goal 2.5-3.5 for warfarin therapy Monitor platelets by anticoagulation protocol: Yes   Plan:  Restart heparin drip at 700 units/hr Recheck 8 hr heparin level at 1600  Closely monitor patient for s/sx bleeding Daily heparin level, CBC F/u long term anticoagulation plan  ----------------------- Addendum:  Pharmacy consulted to restart warfarin therapy as well. PTA regimen above as repeat CBC is improved and no further bleeding has been visualized.   Repeat CBC: Hgb 10.0, Plt 143 (uptrended)  Last INR: 1.3 (sub therapeutic)    Spoke with RN again at 1500 who reported no signs of bleeding from the patient and no issues regarding anticoagulation with heparin infusion  Plan to give warfarin 7.5mg  PO x1 at 1600 today  Closely monitor for s/sx bleeding, CBC, daily INR, and heparin levels   Calton Dach,  PharmD PGY1 Pharmacy Resident 07/25/2020 10:47 AM

## 2020-07-25 NOTE — Progress Notes (Addendum)
ANTICOAGULATION CONSULT NOTE  Pharmacy Consult for heparin Indication: mechanical mitral valve  Labs: Recent Labs    07/22/20 0630 07/23/20 0058 07/24/20 0034 07/24/20 1926 07/25/20 0014  HGB 10.8* 8.8* 7.5* 9.0* 8.7*  HCT 32.9* 26.5* 22.8* 27.5* 25.8*  PLT 105* 107* 120*  --  110*  APTT  --   --   --  132*  --   LABPROT 59.9* 38.6* 22.2* 17.1* 16.6*  INR 6.9* 4.0* 2.0* 1.4* 1.3*  HEPARINUNFRC  --   --   --  0.73* 1.04*  CREATININE 1.29* 1.33* 1.35*  --   --    Assessment: 75 yo female to start heparin while INR is subtherapeutic.  INR 2.0, Hg 7.5, PTLC 120   Patient s/p EGD with gastritis and non-bleeding ulcers. Heparin restarted after EGD.  Heparin level this am 1.04 units/ml.  Drawn from opposite arm heparin infusing in per RN   Goal of Therapy:  Heparin level 0.3-0.5 units/ml Monitor platelets by anticoagulation protocol: Yes   Plan:  Decrease Heparin drip to 700 units/hr Check heparin level in 6-8 hours Daily HL and CBC while on heparin Monitor for bleeding complications  Thanks for allowing pharmacy to be a part of this patient's care.  Talbert Cage, PharmD Clinical Pharmacist 07/25/2020,1:49 AM  Addum:  RN states large tarry stool.  MD has held heparin for now.  Will f/u with GI and Cards in am to resume.

## 2020-07-26 ENCOUNTER — Encounter (HOSPITAL_COMMUNITY): Payer: Self-pay | Admitting: Internal Medicine

## 2020-07-26 DIAGNOSIS — Z952 Presence of prosthetic heart valve: Secondary | ICD-10-CM | POA: Diagnosis not present

## 2020-07-26 DIAGNOSIS — K922 Gastrointestinal hemorrhage, unspecified: Secondary | ICD-10-CM | POA: Diagnosis not present

## 2020-07-26 DIAGNOSIS — D649 Anemia, unspecified: Secondary | ICD-10-CM | POA: Diagnosis not present

## 2020-07-26 LAB — GLUCOSE, CAPILLARY
Glucose-Capillary: 84 mg/dL (ref 70–99)
Glucose-Capillary: 107 mg/dL — ABNORMAL HIGH (ref 70–99)
Glucose-Capillary: 117 mg/dL — ABNORMAL HIGH (ref 70–99)
Glucose-Capillary: 142 mg/dL — ABNORMAL HIGH (ref 70–99)

## 2020-07-26 LAB — CBC
HCT: 26.3 % — ABNORMAL LOW (ref 36.0–46.0)
HCT: 29 % — ABNORMAL LOW (ref 36.0–46.0)
Hemoglobin: 8.6 g/dL — ABNORMAL LOW (ref 12.0–15.0)
Hemoglobin: 9.5 g/dL — ABNORMAL LOW (ref 12.0–15.0)
MCH: 31.4 pg (ref 26.0–34.0)
MCH: 31.3 pg (ref 26.0–34.0)
MCHC: 32.7 g/dL (ref 30.0–36.0)
MCHC: 32.8 g/dL (ref 30.0–36.0)
MCV: 96 fL (ref 80.0–100.0)
MCV: 95.4 fL (ref 80.0–100.0)
Platelets: 137 10*3/uL — ABNORMAL LOW (ref 150–400)
Platelets: 184 10*3/uL (ref 150–400)
RBC: 2.74 MIL/uL — ABNORMAL LOW (ref 3.87–5.11)
RBC: 3.04 MIL/uL — ABNORMAL LOW (ref 3.87–5.11)
RDW: 15.7 % — ABNORMAL HIGH (ref 11.5–15.5)
RDW: 15.6 % — ABNORMAL HIGH (ref 11.5–15.5)
WBC: 10.1 10*3/uL (ref 4.0–10.5)
WBC: 10.5 10*3/uL (ref 4.0–10.5)
nRBC: 0 % (ref 0.0–0.2)
nRBC: 0.2 % (ref 0.0–0.2)

## 2020-07-26 LAB — PROTIME-INR
INR: 1.2 (ref 0.8–1.2)
Prothrombin Time: 15 seconds (ref 11.4–15.2)

## 2020-07-26 LAB — HEPARIN LEVEL (UNFRACTIONATED)
Heparin Unfractionated: 0.45 IU/mL (ref 0.30–0.70)
Heparin Unfractionated: 0.36 IU/mL (ref 0.30–0.70)

## 2020-07-26 MED ORDER — WARFARIN SODIUM 7.5 MG PO TABS
7.5000 mg | ORAL_TABLET | Freq: Once | ORAL | Status: AC
  Administered 2020-07-26: 7.5 mg via ORAL
  Filled 2020-07-26: qty 1

## 2020-07-26 NOTE — Progress Notes (Addendum)
PROGRESS NOTE        PATIENT DETAILS Name: Deborah [name] Age: 75 y.o. Sex: female Date of Birth: 23-Jan-1946 Admit Date: 07/20/2020 Admitting Physician Angie Fava, DO TUU:[id number], Campbell Lerner, MD  Brief Narrative: Patient is a 75 y.o. female with history of chronic systolic heart failure, mitral valve replacement (mechanical valve)-prior history of GI bleeding due to gastric/duodenal ulcers-presenting with dark-colored FOBT positive stools and a hemoglobin of 6.9-in the setting of supratherapeutic INR.  See below for further details.  Significant events: 4/11>> admit for upper GI bleeding-acute blood loss anemia-supratherapeutic INR. 4/15>> EGD  Significant studies: 4/11>> chest x-ray: Possible airspace disease in the left base 4/13>> Echo: EF 20-25%, old septal segment/basal inferior wall appears akinetic, rest of the LV wall severely hypokinetic.  Antimicrobial therapy: Vancomycin: 4/11 x 1 Flagyl: 4/11 x 1 Cefepime: 4/11 x 1  Microbiology data: 4/11>> blood culture: No growth 4/11>> Covid/influenza PCR: Negative  Procedures : 4/15>> EGD: Nonbleeding gastric ulcer, nonbleeding duodenal ulcer.  Duodenitis  Consults: GI, cardiology  DVT Prophylaxis : SCDs Start: 07/20/20 2158  IV heparin   Subjective: Had black stool earlier this morning.  Inquiring about discharge.  Assessment/Plan: Upper GI bleeding with acute blood loss anemia: Did not have BM for 4 days post hospitalization-has had 2 BMs post EGD-both with black stools-suspect this residual old blood that is making his way out-mild drop in hemoglobin but suspect this is equalibration.  Continue to monitor CBC closely-remains on PPI.  Supratherapeutic INR: Has mechanical mitral valve-INR was allowed to drift down-now within normal range.  AKI: Hemodynamically mediated-slowly improving-follow periodically.  Thrombocytopenia: Appears to be mild-improving-follow closely.  Chronic  systolic heart failure: Euvolemic-tolerating Coreg-resume Entresto in the next few days.  History of severe mitral regurgitation-s/p mechanical mitral valve replacement: Currently on IV heparin/overlapping Coumadin-watch closely for any recurrence of GI bleeding-see above.  Low risk findings on EGD done a few days back.  Obesity: Estimated body mass index is 27.02 kg/m as calculated from the following:   Height as of this encounter: 5\' 3"  (1.6 m).   Weight as of this encounter: 69.2 kg.    Diet: Diet Order            Diet heart healthy/carb modified Room service appropriate? No; Fluid consistency: Thin  Diet effective now                  Code Status: Full code   Family Communication: Sister-Norma [phone] voicemail on 4/14, 4/15,4/16  Disposition Plan: Status is: Inpatient  Remains inpatient appropriate because:Inpatient level of care appropriate due to severity of illness   Dispo: The patient is from: Home              Anticipated d/c is to: Home              Patient currently is not medically stable to d/c.   Difficult to place patient No    Barriers to Discharge: Awaiting EGD once INR is acceptable.  Antimicrobial agents: Anti-infectives (From admission, onward)   Start     Dose/Rate Route Frequency Ordered Stop   07/21/20 2000  ceFEPIme (MAXIPIME) 2 g in sodium chloride 0.9 % 100 mL IVPB  Status:  Discontinued        2 g 200 mL/hr over 30 Minutes Intravenous Every 24 hours 07/20/20 2037 07/20/20 2156   07/21/20  2000  ceFEPIme (MAXIPIME) 2 g in sodium chloride 0.9 % 100 mL IVPB  Status:  Discontinued        2 g 200 mL/hr over 30 Minutes Intravenous Every 24 hours 07/21/20 0234 07/21/20 1604   07/21/20 0330  vancomycin (VANCOREADY) IVPB 750 mg/150 mL  Status:  Discontinued        750 mg 150 mL/hr over 60 Minutes Intravenous Every 48 hours 07/21/20 0234 07/21/20 1604   07/20/20 1915  ceFEPIme (MAXIPIME) 2 g in sodium chloride 0.9 % 100 mL IVPB        2  g 200 mL/hr over 30 Minutes Intravenous  Once 07/20/20 1905 07/20/20 2029   07/20/20 1915  metroNIDAZOLE (FLAGYL) IVPB 500 mg        500 mg 100 mL/hr over 60 Minutes Intravenous  Once 07/20/20 1905 07/20/20 2137       Time spent: 25- minutes-Greater than 50% of this time was spent in counseling, explanation of diagnosis, planning of further management, and coordination of care.  MEDICATIONS: Scheduled Meds: . atorvastatin  40 mg Oral Daily  . carvedilol  6.25 mg Oral BID WC  . insulin aspart  0-5 Units Subcutaneous QHS  . insulin aspart  0-6 Units Subcutaneous TID WC  . pantoprazole  40 mg Oral BID  . warfarin  7.5 mg Oral ONCE-1600  . Warfarin - Pharmacist Dosing Inpatient   Does not apply q1600   Continuous Infusions: . heparin 650 Units/hr (07/26/20 0654)   PRN Meds:.acetaminophen, ondansetron (ZOFRAN) IV, polyethylene glycol   PHYSICAL EXAM: Vital signs: Vitals:   07/26/20 0003 07/26/20 0412 07/26/20 0514 07/26/20 0729  BP:  (!) 109/58  104/72  Pulse: 74 60 63 65  Resp: 19 14 (!) 21 18  Temp:  98.2 F (36.8 C)  98.1 F (36.7 C)  TempSrc:  Oral  Axillary  SpO2: 97% 99% 99% 99%  Weight:   69.2 kg   Height:       Filed Weights   07/24/20 1006 07/25/20 0357 07/26/20 0514  Weight: 67.3 kg 68.9 kg 69.2 kg   Body mass index is 27.02 kg/m.   Gen Exam:Alert awake-not in any distress HEENT:atraumatic, normocephalic Chest: B/L clear to auscultation anteriorly CVS:S1S2 regular Abdomen:soft non tender, non distended Extremities:no edema Neurology: Non focal Skin: no rash  I have personally reviewed following labs and imaging studies  LABORATORY DATA: CBC: Recent Labs  Lab 07/20/20 1831 07/21/20 0131 07/21/20 0929 07/21/20 1134 07/24/20 0034 07/24/20 1926 07/25/20 0014 07/25/20 0324 07/25/20 0809 07/25/20 1558 07/26/20 0317  WBC 10.8*  --  11.7*   < > 9.7  --  10.3  --  12.1* 12.8* 10.1  NEUTROABS 9.2*  --  9.3*  --   --   --   --   --   --   --   --    HGB 6.9*   < > 12.2  12.3   < > 7.5*   < > 8.7* 8.7* 10.0* 9.9* 8.6*  HCT 22.0*   < > 36.3  36.5   < > 22.8*   < > 25.8* 26.0* 30.1* 30.1* 26.3*  MCV 100.9*  --  92.1   < > 96.2  --  95.2  --  95.0 95.9 96.0  PLT 177  --  107*   < > 120*  --  110*  --  143* 151 137*   < > = values in this interval not displayed.    Basic Metabolic Panel:  Recent Labs  Lab 07/21/20 0929 07/22/20 0630 07/23/20 0058 07/24/20 0034 07/25/20 0014  NA 144 144 141 138 141  K 4.2 4.3 4.0 4.2 4.2  CL 114* 112* 111 110 111  CO2 23 28 27 28 28   GLUCOSE 188* 112* 108* 106* 85  BUN 64* 52* 50* 49* 35*  CREATININE 1.60* 1.29* 1.33* 1.35* 1.16*  CALCIUM 8.2* 8.4* 8.0* 7.9* 8.1*  MG 1.7  1.8 1.7  --   --   --   PHOS  --  2.6  --   --   --     GFR: Estimated Creatinine Clearance: 38.5 mL/min (A) (by C-G formula based on SCr of 1.16 mg/dL (H)).  Liver Function Tests: Recent Labs  Lab 07/20/20 1831 07/21/20 0929  AST 14* 16  ALT 15 13  ALKPHOS 38 35*  BILITOT 0.5 0.7  PROT 5.4* 5.0*  ALBUMIN 3.1* 3.0*   Recent Labs  Lab 07/20/20 1831  LIPASE 34   No results for input(s): AMMONIA in the last 168 hours.  Coagulation Profile: Recent Labs  Lab 07/23/20 0058 07/24/20 0034 07/24/20 1926 07/25/20 0014 07/26/20 0317  INR 4.0* 2.0* 1.4* 1.3* 1.2    Cardiac Enzymes: No results for input(s): CKTOTAL, CKMB, CKMBINDEX, TROPONINI in the last 168 hours.  BNP (last 3 results) No results for input(s): PROBNP in the last 8760 hours.  Lipid Profile: No results for input(s): CHOL, HDL, LDLCALC, TRIG, CHOLHDL, LDLDIRECT in the last 72 hours.  Thyroid Function Tests: No results for input(s): TSH, T4TOTAL, FREET4, T3FREE, THYROIDAB in the last 72 hours.  Anemia Panel: No results for input(s): VITAMINB12, FOLATE, FERRITIN, TIBC, IRON, RETICCTPCT in the last 72 hours.  Urine analysis:    Component Value Date/Time   COLORURINE YELLOW 07/20/2020 2355   APPEARANCEUR HAZY (A) 07/20/2020 2355    LABSPEC 1.014 07/20/2020 2355   PHURINE 5.0 07/20/2020 2355   GLUCOSEU NEGATIVE 07/20/2020 2355   HGBUR LARGE (A) 07/20/2020 2355   BILIRUBINUR NEGATIVE 07/20/2020 2355   KETONESUR 5 (A) 07/20/2020 2355   PROTEINUR NEGATIVE 07/20/2020 2355   UROBILINOGEN 1.0 05/10/2011 1440   NITRITE NEGATIVE 07/20/2020 2355   LEUKOCYTESUR TRACE (A) 07/20/2020 2355    Sepsis Labs: Lactic Acid, Venous    Component Value Date/Time   LATICACIDVEN 1.8 07/21/2020 0107    MICROBIOLOGY: Recent Results (from the past 240 hour(s))  Urine culture     Status: None   Collection Time: 07/20/20  7:05 PM   Specimen: In/Out Cath Urine  Result Value Ref Range Status   Specimen Description IN/OUT CATH URINE  Final   Special Requests NONE  Final   Culture   Final    NO GROWTH Performed at [facility], [address] [place], [place]    Report Status 07/23/2020 FINAL  Final  Blood Culture (routine x 2)     Status: None   Collection Time: 07/20/20  7:33 PM   Specimen: BLOOD  Result Value Ref Range Status   Specimen Description BLOOD SITE NOT SPECIFIED  Final   Special Requests   Final    BOTTLES DRAWN AEROBIC AND ANAEROBIC Blood Culture results may not be optimal due to an inadequate volume of blood received in culture bottles   Culture   Final    NO GROWTH 5 DAYS Performed at [facility], [address], [place], [place]    Report Status 07/25/2020 FINAL  Final  Resp Panel by RT-PCR (Flu A&B,  Covid) Nasopharyngeal Swab     Status: None   Collection Time: 07/20/20  7:46 PM   Specimen: Nasopharyngeal Swab; Nasopharyngeal(NP) swabs in vial transport medium  Result Value Ref Range Status   SARS Coronavirus 2 by RT PCR NEGATIVE NEGATIVE Final    Comment: (NOTE) SARS-CoV-2 target nucleic acids are NOT DETECTED.  The SARS-CoV-2 RNA is generally detectable in upper respiratory specimens during the acute phase of infection. The lowest concentration of SARS-CoV-2 viral  copies this assay can detect is 138 copies/mL. A negative result does not preclude SARS-Cov-2 infection and should not be used as the sole basis for treatment or other patient management decisions. A negative result may occur with  improper specimen collection/handling, submission of specimen other than nasopharyngeal swab, presence of viral mutation(s) within the areas targeted by this assay, and inadequate number of viral copies(<138 copies/mL). A negative result must be combined with clinical observations, patient history, and epidemiological information. The expected result is Negative.  Fact Sheet for Patients:  BloggerCourse.com  Fact Sheet for Healthcare Providers:  SeriousBroker.it  This test is no t yet approved or cleared by the [place] FDA and  has been authorized for detection and/or diagnosis of SARS-CoV-2 by FDA under an Emergency Use Authorization (EUA). This EUA will remain  in effect (meaning this test can be used) for the duration of the COVID-19 declaration under Section 564(b)(1) of the Act, 21 U.S.C.section 360bbb-3(b)(1), unless the authorization is terminated  or revoked sooner.       Influenza A by PCR NEGATIVE NEGATIVE Final   Influenza B by PCR NEGATIVE NEGATIVE Final    Comment: (NOTE) The Xpert Xpress SARS-CoV-2/FLU/RSV plus assay is intended as an aid in the diagnosis of influenza from Nasopharyngeal swab specimens and should not be used as a sole basis for treatment. Nasal washings and aspirates are unacceptable for Xpert Xpress SARS-CoV-2/FLU/RSV testing.  Fact Sheet for Patients: BloggerCourse.com  Fact Sheet for Healthcare Providers: SeriousBroker.it  This test is not yet approved or cleared by the [place] FDA and has been authorized for detection and/or diagnosis of SARS-CoV-2 by FDA under an Emergency Use Authorization (EUA). This  EUA will remain in effect (meaning this test can be used) for the duration of the COVID-19 declaration under Section 564(b)(1) of the Act, 21 U.S.C. section 360bbb-3(b)(1), unless the authorization is terminated or revoked.  Performed at [facility], [address] [place], [place] [address]   Blood Culture (routine x 2)     Status: None   Collection Time: 07/20/20  7:47 PM   Specimen: BLOOD  Result Value Ref Range Status   Specimen Description BLOOD SITE NOT SPECIFIED  Final   Special Requests   Final    BOTTLES DRAWN AEROBIC AND ANAEROBIC Blood Culture results may not be optimal due to an inadequate volume of blood received in culture bottles   Culture   Final    NO GROWTH 5 DAYS Performed at [facility], [address], [place], [place] [address]    Report Status 07/25/2020 FINAL  Final  MRSA PCR Screening     Status: None   Collection Time: 07/24/20  7:14 AM   Specimen: Nasal Mucosa; Nasopharyngeal  Result Value Ref Range Status   MRSA by PCR NEGATIVE NEGATIVE Final    Comment:        The GeneXpert MRSA Assay (FDA approved for NASAL specimens only), is one component of a comprehensive MRSA colonization surveillance program. It is not  intended to diagnose MRSA infection nor to guide or monitor treatment for MRSA infections. Performed at [facility], [address] [place], [place] [address]     RADIOLOGY STUDIES/RESULTS: No results found.   LOS: 6 days   Jeoffrey MassedShanker Onita Pfluger, MD  [facility]    To contact the attending provider between 7A-7P or the covering provider during after hours 7P-7A, please log into the web site [email or web address] and access using universal [place] password for that web site. If you do not have the password, please call the hospital operator.  07/26/2020, 11:02 AM

## 2020-07-26 NOTE — Progress Notes (Signed)
Progress Note  Patient Name: Deborah [name] Date of Encounter: 07/26/2020  [facility] Cardiologist: Arvilla Meres, MD  [facility]  EP  Graciela Husbands  Subjective   Pt denies CP  No SOB  No dizzienss   Says she had 1 dark BM yesterday and 1today    No nausea Inpatient Medications    Scheduled Meds: . atorvastatin  40 mg Oral Daily  . carvedilol  6.25 mg Oral BID WC  . insulin aspart  0-5 Units Subcutaneous QHS  . insulin aspart  0-6 Units Subcutaneous TID WC  . pantoprazole  40 mg Oral BID  . warfarin  7.5 mg Oral ONCE-1600  . Warfarin - Pharmacist Dosing Inpatient   Does not apply q1600   Continuous Infusions: . heparin 650 Units/hr (07/26/20 0654)   PRN Meds: acetaminophen, ondansetron (ZOFRAN) IV, polyethylene glycol   Vital Signs    Vitals:   07/26/20 0003 07/26/20 0412 07/26/20 0514 07/26/20 0729  BP:  (!) 109/58  104/72  Pulse: 74 60 63 65  Resp: 19 14 (!) 21 18  Temp:  98.2 F (36.8 C)  98.1 F (36.7 C)  TempSrc:  Oral  Axillary  SpO2: 97% 99% 99% 99%  Weight:   69.2 kg   Height:        Intake/Output Summary (Last 24 hours) at 07/26/2020 1011 Last data filed at 07/26/2020 0654 Gross per 24 hour  Intake 502.25 ml  Output 550 ml  Net -47.75 ml   Last 3 Weights 07/26/2020 07/25/2020 07/24/2020  Weight (lbs) 152 lb 8.9 oz 151 lb 14.4 oz 148 lb 5.9 oz  Weight (kg) 69.2 kg 68.9 kg 67.3 kg       Physical Exam   GEN: No acute distress.   Neck: No JVD Cardiac: RRR, Crisp valve sounds  Respiratory: Clear to auscultation bilaterally. GI: Soft, nontender, non-distended  MS: No edema; No deformity. Neuro:  Nonfocal  Psych: Normal affect   Labs    High Sensitivity Troponin:  No results for input(s): TROPONINIHS in the last 720 hours.    Chemistry Recent Labs  Lab 07/20/20 1831 07/21/20 0929 07/22/20 0630 07/23/20 0058 07/24/20 0034 07/25/20 0014  NA 139 144   < > 141 138 141  K 4.2 4.2   < > 4.0 4.2 4.2  CL 108 114*   < > 111 110 111  CO2 24 23   < >  27 28 28   GLUCOSE 271* 188*   < > 108* 106* 85  BUN 77* 64*   < > 50* 49* 35*  CREATININE 1.88* 1.60*   < > 1.33* 1.35* 1.16*  CALCIUM 8.4* 8.2*   < > 8.0* 7.9* 8.1*  PROT 5.4* 5.0*  --   --   --   --   ALBUMIN 3.1* 3.0*  --   --   --   --   AST 14* 16  --   --   --   --   ALT 15 13  --   --   --   --   ALKPHOS 38 35*  --   --   --   --   BILITOT 0.5 0.7  --   --   --   --   GFRNONAA 27* 33*   < > 41* 41* 49*  ANIONGAP 7 7   < > 3* 0* 2*   < > = values in this interval not displayed.     Hematology Recent Labs  Lab  07/25/20 0809 07/25/20 1558 07/26/20 0317  WBC 12.1* 12.8* 10.1  RBC 3.17* 3.14* 2.74*  HGB 10.0* 9.9* 8.6*  HCT 30.1* 30.1* 26.3*  MCV 95.0 95.9 96.0  MCH 31.5 31.5 31.4  MCHC 33.2 32.9 32.7  RDW 16.0* 15.9* 15.7*  PLT 143* 151 137*    BNPNo results for input(s): BNP, PROBNP in the last 168 hours.   DDimer No results for input(s): DDIMER in the last 168 hours.   Radiology    No results found.  Cardiac Studies    ECHO 07/22/20   1. Left ventricular ejection fraction, by estimation, is 20 to 25%. The  left ventricle has severely decreased function. The left ventricle  demonstrates regional wall motion abnormalities.      All septal segments and the basal inferior segment appear akinetic.  The rest of the LV walls appears severely hypokinetic. The left  ventricular internal cavity size was moderately dilated. Diastolic  function indeterminant due to mechanical mitral  valve.  2. Right ventricular systolic function is normal. The right ventricular  size is normal. There is moderately elevated pulmonary artery systolic  pressure. The estimated right ventricular systolic pressure is 45.8 mmHg.  3. Left atrial size was severely dilated.  4. The mitral valve has been repaired/replaced. A mechanical [employer] Jude  mitral valve is present and appears well seated with normal function by  doppler interrogation. Mean gradient at HR 86bpm. There  is trivial  mitral regurgitation.  5. The aortic valve is tricuspid. There is mild thickening of the aortic  valve. Aortic valve regurgitation is not visualized. Mild aortic valve  sclerosis is present, with no evidence of aortic valve stenosis.  6. The inferior vena cava is normal in size with greater than 50%  respiratory variability, suggesting right atrial pressure of 3 mmHg.   Comparison(s): No significant change from prior study.   Patient Profile      Deborah [name] is a 75 y.o. female with a hx of NICM w/ EF 25%, STJ singe chamber ICD, STJ MVR 2005 on coumadin, DM, HTN, HLD, nephrolithiasis, S-CHF, mild AS,ABL anemia requiring 3 PRBCs 2016 w/ gastric and duodenal ulcers on EGD (INR 4.5), who is being seen today for the management of anticoagulation at the request of Dr Lucianne Muss.   Assessment & Plan    1  MV dz  S/p mechanical MV   Again, valve sounds good on exam  Gradient 4 mm Hg Currently on IV heparin  Coumadin started last night   Hgb 8.6 this am I would recheck CBC later today (BID ) for now   2  Chronic systolic CHF  Echo noted above     Had been on Coreg 25 bid and Entresto 97/103 bid BP is still marginal   Fold for now until stabilizes / improves    VOlume status appears ok    3 GI   Endoscopy showed gastritis, nonbleeding ulcers in stomach and duodenum, duodenitis   REcomm BID PPI   PEr their note  OK to resume anticoagulation with close f/u of H/H    4 Anemia   8.6 this am  REcheck CBC this PM  5 HL  Had been on lipitor   REsume       For questions or updates, please contact [facility] Please consult [email or web address] for contact info under        Signed, Dietrich Pates, MD  07/26/2020, 10:11 AM

## 2020-07-26 NOTE — Progress Notes (Addendum)
ANTICOAGULATION CONSULT NOTE  Pharmacy Consult for heparin Indication: mechanical mitral valve  Labs: Recent Labs    07/24/20 0034 07/24/20 0034 07/24/20 1926 07/25/20 0014 07/25/20 0324 07/25/20 0809 07/25/20 1558 07/26/20 0317  HGB 7.5*  --  9.0* 8.7*   < > 10.0* 9.9* 8.6*  HCT 22.8*  --  27.5* 25.8*   < > 30.1* 30.1* 26.3*  PLT 120*  --   --  110*  --  143* 151 137*  APTT  --   --  132*  --   --   --   --   --   LABPROT 22.2*  --  17.1* 16.6*  --   --   --  15.0  INR 2.0*  --  1.4* 1.3*  --   --   --  1.2  HEPARINUNFRC  --    < > 0.73* 1.04*  --   --  0.63 0.45  CREATININE 1.35*  --   --  1.16*  --   --   --   --    < > = values in this interval not displayed.   Assessment: 75 yo F on warfarin PTA (Per med rec: patient takes warfarin 7.5mg  PO every day) to start heparin drip while INR is subtherapeutic. Patient s/p EGD with gastritis and non-bleeding ulcers. Heparin restarted after EGD. Pt had 1 episode of tarry stool and heparin was held 4/16 at  0334. Received request from MD to restart heparin infusion for anticoagulation. Will continue to aim for low 0.3-0.5 with no bolus.    Heparin level is therapeutic for goal at 0.45 on 650 units/hr.  INR is sub therapeutic at 1.2 after 1 dose of 7.5mg .  H/H is ow but stable. Platelets are low-stable as well at 137.  No further bleeding noted.   Goal of Therapy:  Heparin level 0.3-0.5 units/ml INR goal 2.5-3.5 for warfarin therapy Monitor platelets by anticoagulation protocol: Yes   Plan:  Continue heparin gtt at 650 units/hr (6.5 ml/hr). Repeat Heparin level in 6 hours to make sure no accumulation Warfarin 7.5mg  po x1 tonight at 1600  Link Snuffer, PharmD, [place], BCCCP Clinical Pharmacist Please refer to [facility] for [facility] numbers 07/26/2020 7:15 AM  Addendum: Confirmatory Heparin level therapeutic - continue current rate.

## 2020-07-27 DIAGNOSIS — K922 Gastrointestinal hemorrhage, unspecified: Secondary | ICD-10-CM | POA: Diagnosis not present

## 2020-07-27 DIAGNOSIS — Z952 Presence of prosthetic heart valve: Secondary | ICD-10-CM | POA: Diagnosis not present

## 2020-07-27 DIAGNOSIS — D649 Anemia, unspecified: Secondary | ICD-10-CM | POA: Diagnosis not present

## 2020-07-27 LAB — PROTIME-INR
INR: 1.2 (ref 0.8–1.2)
Prothrombin Time: 15.3 seconds — ABNORMAL HIGH (ref 11.4–15.2)

## 2020-07-27 LAB — HEPARIN LEVEL (UNFRACTIONATED): Heparin Unfractionated: 0.55 IU/mL (ref 0.30–0.70)

## 2020-07-27 LAB — CBC
HCT: 25.7 % — ABNORMAL LOW (ref 36.0–46.0)
Hemoglobin: 8.4 g/dL — ABNORMAL LOW (ref 12.0–15.0)
MCH: 31.5 pg (ref 26.0–34.0)
MCHC: 32.7 g/dL (ref 30.0–36.0)
MCV: 96.3 fL (ref 80.0–100.0)
Platelets: 164 10*3/uL (ref 150–400)
RBC: 2.67 MIL/uL — ABNORMAL LOW (ref 3.87–5.11)
RDW: 15.4 % (ref 11.5–15.5)
WBC: 9.7 10*3/uL (ref 4.0–10.5)
nRBC: 0 % (ref 0.0–0.2)

## 2020-07-27 LAB — GLUCOSE, CAPILLARY
Glucose-Capillary: 87 mg/dL (ref 70–99)
Glucose-Capillary: 112 mg/dL — ABNORMAL HIGH (ref 70–99)
Glucose-Capillary: 120 mg/dL — ABNORMAL HIGH (ref 70–99)
Glucose-Capillary: 157 mg/dL — ABNORMAL HIGH (ref 70–99)

## 2020-07-27 LAB — SURGICAL PATHOLOGY

## 2020-07-27 MED ORDER — WARFARIN SODIUM 5 MG PO TABS
10.0000 mg | ORAL_TABLET | Freq: Once | ORAL | Status: AC
  Administered 2020-07-27: 10 mg via ORAL
  Filled 2020-07-27: qty 2

## 2020-07-27 NOTE — Progress Notes (Addendum)
Progress Note  Patient Name: Deborah [name] Date of Encounter: 07/27/2020  [facility] Cardiologist: Arvilla Meres, MD   Subjective   Feels okay this morning. No bleeding overnight though has not had a BM today. No complaints of chest pain, SOB, or palpitations. No orthopnea, PND, or LE edema.   Inpatient Medications    Scheduled Meds: . atorvastatin  40 mg Oral Daily  . carvedilol  6.25 mg Oral BID WC  . insulin aspart  0-5 Units Subcutaneous QHS  . insulin aspart  0-6 Units Subcutaneous TID WC  . pantoprazole  40 mg Oral BID  . warfarin  10 mg Oral ONCE-1600  . Warfarin - Pharmacist Dosing Inpatient   Does not apply q1600   Continuous Infusions: . heparin 600 Units/hr (07/27/20 0832)   PRN Meds: acetaminophen, ondansetron (ZOFRAN) IV, polyethylene glycol   Vital Signs    Vitals:   07/27/20 0013 07/27/20 0342 07/27/20 0558 07/27/20 0745  BP:  95/64  (!) 100/52  Pulse: 70 62 63 70  Resp: 18 18 19 20   Temp:  99.3 F (37.4 C)    TempSrc:  Oral  Oral  SpO2: 100% 100% 100%   Weight:   69.4 kg   Height:        Intake/Output Summary (Last 24 hours) at 07/27/2020 0841 Last data filed at 07/27/2020 0700 Gross per 24 hour  Intake 272.16 ml  Output 450 ml  Net -177.84 ml   Last 3 Weights 07/27/2020 07/26/2020 07/25/2020  Weight (lbs) 153 lb 152 lb 8.9 oz 151 lb 14.4 oz  Weight (kg) 69.4 kg 69.2 kg 68.9 kg      Telemetry    Sinus rhythm with occasional PVCs - Personally Reviewed  ECG    No new tracings - Personally Reviewed  Physical Exam   GEN: No acute distress.   Neck: No JVD Cardiac: RRR, mechanical click murmurs, rubs, or gallops.  Respiratory: Clear to auscultation bilaterally. GI: Soft, nontender, non-distended  MS: No edema; No deformity. Neuro:  Nonfocal  Psych: Normal affect   Labs    High Sensitivity Troponin:  No results for input(s): TROPONINIHS in the last 720 hours.    Chemistry Recent Labs  Lab 07/20/20 1831 07/21/20 0929  07/22/20 0630 07/23/20 0058 07/24/20 0034 07/25/20 0014  NA 139 144   < > 141 138 141  K 4.2 4.2   < > 4.0 4.2 4.2  CL 108 114*   < > 111 110 111  CO2 24 23   < > 27 28 28   GLUCOSE 271* 188*   < > 108* 106* 85  BUN 77* 64*   < > 50* 49* 35*  CREATININE 1.88* 1.60*   < > 1.33* 1.35* 1.16*  CALCIUM 8.4* 8.2*   < > 8.0* 7.9* 8.1*  PROT 5.4* 5.0*  --   --   --   --   ALBUMIN 3.1* 3.0*  --   --   --   --   AST 14* 16  --   --   --   --   ALT 15 13  --   --   --   --   ALKPHOS 38 35*  --   --   --   --   BILITOT 0.5 0.7  --   --   --   --   GFRNONAA 27* 33*   < > 41* 41* 49*  ANIONGAP 7 7   < > 3* 0* 2*   < > =  values in this interval not displayed.     Hematology Recent Labs  Lab 07/26/20 0317 07/26/20 1715 07/27/20 0114  WBC 10.1 10.5 9.7  RBC 2.74* 3.04* 2.67*  HGB 8.6* 9.5* 8.4*  HCT 26.3* 29.0* 25.7*  MCV 96.0 95.4 96.3  MCH 31.4 31.3 31.5  MCHC 32.7 32.8 32.7  RDW 15.7* 15.6* 15.4  PLT 137* 184 164    BNPNo results for input(s): BNP, PROBNP in the last 168 hours.   DDimer No results for input(s): DDIMER in the last 168 hours.   Radiology    No results found.  Cardiac Studies   ECHO 07/22/20   1. Left ventricular ejection fraction, by estimation, is 20 to 25%. The  left ventricle has severely decreased function. The left ventricle  demonstrates regional wall motion abnormalities.   All septal segments and the basal inferior segment appear akinetic.  The rest of the LV walls appears severely hypokinetic. The left  ventricular internal cavity size was moderately dilated. Diastolic  function indeterminant due to mechanical mitral  valve.  2. Right ventricular systolic function is normal. The right ventricular  size is normal. There is moderately elevated pulmonary artery systolic  pressure. The estimated right ventricular systolic pressure is 45.8 mmHg.  3. Left atrial size was severely dilated.  4. The mitral valve has been  repaired/replaced. A mechanical [employer]  mitral valve is present and appears well seated with normal function by  doppler interrogation. Mean gradient at HR 86bpm. There is trivial  mitral regurgitation.  5. The aortic valve is tricuspid. There is mild thickening of the aortic  valve. Aortic valve regurgitation is not visualized. Mild aortic valve  sclerosis is present, with no evidence of aortic valve stenosis.  6. The inferior vena cava is normal in size with greater than 50%  respiratory variability, suggesting right atrial pressure of 3 mmHg.   Comparison(s): No significant change from prior study.   Patient Profile     Deborah [name] a 75 y.o.femalewith a hx of NICM w/ EF 25%, STJ singe chamber ICD, STJ MVR 2005 on coumadin, DM, HTN, HLD, nephrolithiasis, S-CHF, mild AS,ABL anemia requiring 3 PRBCs2016 w/ gastric and duodenal ulcers on EGD (INR 4.5),who is being followed by cardiology for the management of anticoagulation  Assessment & Plan    1. Mitral valve disease s/p mechanical MVR in 2005: patient has been on coumadin long term following MVR. Presented with acute on chronic anemia in the setting of GI bleed and supratherapeutic INR. Found to have gastritis and non-bleeding stomach/duodenal ulcers on EGD this admission. Cleared by GI to resume coumadin. INR 1.2 today. She remains on heparin gtt to bridge. Hgb 8.4 today, down from 9.5 yesterday evening and 8.6 yesterday morning.  - Continue coumadin per pharmacy - goal INR 2.5-3.5 - Continue heparin gtt per pharmacy to therapeutic INR  2. Chronic combined CHF: longstanding NICM with EF 20-25% on echo this admission. Home carvedilol and entresto initially on hold, now back on carvedilol with ongoing soft blood pressures. She appears euvolemic on exam - Continue carvedilol as tolerated. - Continue to hold home entresto - restart as BP will allow  3. Acute on chronic anemia in the setting of GI bleed and  supratherapeutic INR: Patient presented with N/V and + occult stool. Required 3u PRBC this admission.  Found to have gastritis and non-bleeding stomach/duodenal ulcers on EGD this admission. Cleared by GI to resume coumadin. INR 1.2 today. She remains on heparin gtt to  bridge. Hgb 8.4 today, down from 9.5 yesterday evening and 8.6 yesterday morning.  - Continue PPI BID - Continue management per GI and primary team  4. HTN: BP soft but stable - Managed in the context of #2  5. HLD: no recent lipids on file - Continue atorvastatin  6. DM type 2: appears to be diet controlled at home. No recent A1C on file - Continue management per primary team       For questions or updates, please contact [facility] Please consult [email or web address] for contact info under        Signed, Beatriz Stallion, PA-C  07/27/2020, 8:41 AM    Patient seen and examined  I agree with findings of K Kroeger above Pt comfortable in chair  Did have one dark BM today   No SOB   Lungs are CTA Cardiac RRR  Crisp valve sounds Ext are without edema  H/H have remained relatively stable  Keep on heparin and coumadin   CHF meds on hold as BP is marginal  FOllow for now  Volume looks OK  Dietrich Pates MD

## 2020-07-27 NOTE — Progress Notes (Signed)
ANTICOAGULATION CONSULT NOTE  Pharmacy Consult for heparin Indication: mechanical mitral valve  Labs: Recent Labs    07/24/20 1926 07/25/20 0014 07/25/20 0324 07/26/20 0317 07/26/20 1118 07/26/20 1715 07/27/20 0114  HGB 9.0* 8.7*   < > 8.6*  --  9.5* 8.4*  HCT 27.5* 25.8*   < > 26.3*  --  29.0* 25.7*  PLT  --  110*   < > 137*  --  184 164  APTT 132*  --   --   --   --   --   --   LABPROT 17.1* 16.6*  --  15.0  --   --  15.3*  INR 1.4* 1.3*  --  1.2  --   --  1.2  HEPARINUNFRC 0.73* 1.04*   < > 0.45 0.36  --  0.55  CREATININE  --  1.16*  --   --   --   --   --    < > = values in this interval not displayed.   Assessment: 75 yo F on warfarin PTA (Per med rec: patient takes warfarin 7.5mg  PO every day) to start heparin drip while INR is subtherapeutic. Patient s/p EGD with gastritis and non-bleeding ulcers. Heparin restarted after EGD. Pt had 1 episode of tarry stool and heparin was held 4/16 at  0334. Received request from MD to restart heparin infusion for anticoagulation. Will continue to aim for low 0.3-0.5 with no bolus.    HL is slightly above the modified goal due to GIB. We will reduce heparin slightly and give one time higher dose of coumadin. Hgb remains in 8s.  Goal of Therapy:  Heparin level 0.3-0.5 units/ml INR goal 2.5-3.5 for warfarin therapy Monitor platelets by anticoagulation protocol: Yes   Plan:  Decrease heparin gtt to 600 units/hr  Heparin level in 8 hr Warfarin 10mg  po x1 tonight  , PharmD, [place], AAHIVP, CPP Infectious Disease Pharmacist 07/27/2020 8:30 AM

## 2020-07-27 NOTE — Progress Notes (Signed)
Chronic active gastritis and H pylori detected on pathology At discharge please provide RX for Pylera equivalent: Protonix 40 bid Bismuth subsalicylate 300 mg qid Metronidazole 250 mg qid Tetracycline 500 mg qid D/w Dr Bonna Gains PA-C

## 2020-07-27 NOTE — Progress Notes (Signed)
PROGRESS NOTE        PATIENT DETAILS Name: Deborah [name] Age: 75 y.o. Sex: female Date of Birth: Jan 31, 1946 Admit Date: 07/20/2020 Admitting Physician Angie Fava, DO PPI:RJJOACZYSAYT, Campbell Lerner, MD  Brief Narrative: Patient is a 75 y.o. female with history of chronic systolic heart failure, mitral valve replacement (mechanical valve)-prior history of GI bleeding due to gastric/duodenal ulcers-presenting with dark-colored FOBT positive stools and a hemoglobin of 6.9-in the setting of supratherapeutic INR.  See below for further details.  Significant events: 4/11>> admit for upper GI bleeding-acute blood loss anemia-supratherapeutic INR. 4/15>> EGD  Significant studies: 4/11>> chest x-ray: Possible airspace disease in the left base 4/13>> Echo: EF 20-25%, old septal segment/basal inferior wall appears akinetic, rest of the LV wall severely hypokinetic.  Antimicrobial therapy: Vancomycin: 4/11 x 1 Flagyl: 4/11 x 1 Cefepime: 4/11 x 1  Microbiology data: 4/11>> blood culture: No growth 4/11>> Covid/influenza PCR: Negative  Procedures : 4/15>> EGD: Nonbleeding gastric ulcer, nonbleeding duodenal ulcer.  Duodenitis  Consults: GI, cardiology  DVT Prophylaxis : SCDs Start: 07/20/20 2158  IV heparin   Subjective: No BM today-had BM yesterday that was black in color.  Anxious to go home.  Assessment/Plan: Upper GI bleeding with acute blood loss anemia: Did not have BM for 4 days post hospitalization-has had 2 BMs post EGD-last on 4/17-stools still black-given stability in hemoglobin-suspect this is old blood that is making his way out.  Hemoglobin relatively stable-fluctuating somewhat.  EGD with low risk findings.  Continue PPI-follow CBC while on heparin/Coumadin.  Supratherapeutic INR: Has mechanical mitral valve-INR was allowed to drift down-now within normal range.  AKI: Hemodynamically mediated-slowly improving-follow  periodically.  Thrombocytopenia: Resolved.  Chronic systolic heart failure: Euvolemic-tolerating Coreg-resume Entresto in the next few days.  History of severe mitral regurgitation-s/p mechanical mitral valve replacement: Currently on IV heparin/overlapping Coumadin-watch closely for any recurrence of GI bleeding-see above.  Low risk findings on EGD done a few days back.  Obesity: Estimated body mass index is 27.1 kg/m as calculated from the following:   Height as of this encounter: 5\' 3"  (1.6 m).   Weight as of this encounter: 69.4 kg.    Diet: Diet Order            Diet heart healthy/carb modified Room service appropriate? No; Fluid consistency: Thin  Diet effective now                  Code Status: Full code   Family Communication: Sister-Norma [phone] voicemail on 4/14, 4/15,4/16, 4/18  Disposition Plan: Status is: Inpatient  Remains inpatient appropriate because:Inpatient level of care appropriate due to severity of illness   Dispo: The patient is from: Home              Anticipated d/c is to: Home              Patient currently is not medically stable to d/c.   Difficult to place patient No    Barriers to Discharge: Recent GI bleeding-mechanical mitral valve-having black stools post EGD but suspect this is old blood-hemoglobin fluctuating-needs to remain inpatient until we are sure that patient does not have recurrent bleeding while anticoagulation is being restarted.  Currently on IV heparin with overlapping Coumadin.  Antimicrobial agents: Anti-infectives (From admission, onward)   Start     Dose/Rate Route Frequency Ordered Stop  07/21/20 2000  ceFEPIme (MAXIPIME) 2 g in sodium chloride 0.9 % 100 mL IVPB  Status:  Discontinued        2 g 200 mL/hr over 30 Minutes Intravenous Every 24 hours 07/20/20 2037 07/20/20 2156   07/21/20 2000  ceFEPIme (MAXIPIME) 2 g in sodium chloride 0.9 % 100 mL IVPB  Status:  Discontinued        2 g 200 mL/hr over 30  Minutes Intravenous Every 24 hours 07/21/20 0234 07/21/20 1604   07/21/20 0330  vancomycin (VANCOREADY) IVPB 750 mg/150 mL  Status:  Discontinued        750 mg 150 mL/hr over 60 Minutes Intravenous Every 48 hours 07/21/20 0234 07/21/20 1604   07/20/20 1915  ceFEPIme (MAXIPIME) 2 g in sodium chloride 0.9 % 100 mL IVPB        2 g 200 mL/hr over 30 Minutes Intravenous  Once 07/20/20 1905 07/20/20 2029   07/20/20 1915  metroNIDAZOLE (FLAGYL) IVPB 500 mg        500 mg 100 mL/hr over 60 Minutes Intravenous  Once 07/20/20 1905 07/20/20 2137       Time spent: 25- minutes-Greater than 50% of this time was spent in counseling, explanation of diagnosis, planning of further management, and coordination of care.  MEDICATIONS: Scheduled Meds: . atorvastatin  40 mg Oral Daily  . carvedilol  6.25 mg Oral BID WC  . insulin aspart  0-5 Units Subcutaneous QHS  . insulin aspart  0-6 Units Subcutaneous TID WC  . pantoprazole  40 mg Oral BID  . warfarin  10 mg Oral ONCE-1600  . Warfarin - Pharmacist Dosing Inpatient   Does not apply q1600   Continuous Infusions: . heparin 600 Units/hr (07/27/20 0832)   PRN Meds:.acetaminophen, ondansetron (ZOFRAN) IV, polyethylene glycol   PHYSICAL EXAM: Vital signs: Vitals:   07/27/20 0013 07/27/20 0342 07/27/20 0558 07/27/20 0745  BP:  95/64  (!) 100/52  Pulse: 70 62 63 70  Resp: 18 18 19 20   Temp:  99.3 F (37.4 C)    TempSrc:  Oral  Oral  SpO2: 100% 100% 100%   Weight:   69.4 kg   Height:       Filed Weights   07/25/20 0357 07/26/20 0514 07/27/20 0558  Weight: 68.9 kg 69.2 kg 69.4 kg   Body mass index is 27.1 kg/m.   Gen Exam:Alert awake-not in any distress HEENT:atraumatic, normocephalic Chest: B/L clear to auscultation anteriorly CVS:S1S2 regular Abdomen:soft non tender, non distended Extremities:no edema Neurology: Non focal Skin: no rash  I have personally reviewed following labs and imaging studies  LABORATORY DATA: CBC: Recent  Labs  Lab 07/20/20 1831 07/21/20 0131 07/21/20 0929 07/21/20 1134 07/25/20 0809 07/25/20 1558 07/26/20 0317 07/26/20 1715 07/27/20 0114  WBC 10.8*  --  11.7*   < > 12.1* 12.8* 10.1 10.5 9.7  NEUTROABS 9.2*  --  9.3*  --   --   --   --   --   --   HGB 6.9*   < > 12.2  12.3   < > 10.0* 9.9* 8.6* 9.5* 8.4*  HCT 22.0*   < > 36.3  36.5   < > 30.1* 30.1* 26.3* 29.0* 25.7*  MCV 100.9*  --  92.1   < > 95.0 95.9 96.0 95.4 96.3  PLT 177  --  107*   < > 143* 151 137* 184 164   < > = values in this interval not displayed.    Basic  Metabolic Panel: Recent Labs  Lab 07/21/20 0929 07/22/20 0630 07/23/20 0058 07/24/20 0034 07/25/20 0014  NA 144 144 141 138 141  K 4.2 4.3 4.0 4.2 4.2  CL 114* 112* 111 110 111  CO2 23 28 27 28 28   GLUCOSE 188* 112* 108* 106* 85  BUN 64* 52* 50* 49* 35*  CREATININE 1.60* 1.29* 1.33* 1.35* 1.16*  CALCIUM 8.2* 8.4* 8.0* 7.9* 8.1*  MG 1.7  1.8 1.7  --   --   --   PHOS  --  2.6  --   --   --     GFR: Estimated Creatinine Clearance: 38.6 mL/min (A) (by C-G formula based on SCr of 1.16 mg/dL (H)).  Liver Function Tests: Recent Labs  Lab 07/20/20 1831 07/21/20 0929  AST 14* 16  ALT 15 13  ALKPHOS 38 35*  BILITOT 0.5 0.7  PROT 5.4* 5.0*  ALBUMIN 3.1* 3.0*   Recent Labs  Lab 07/20/20 1831  LIPASE 34   No results for input(s): AMMONIA in the last 168 hours.  Coagulation Profile: Recent Labs  Lab 07/24/20 0034 07/24/20 1926 07/25/20 0014 07/26/20 0317 07/27/20 0114  INR 2.0* 1.4* 1.3* 1.2 1.2    Cardiac Enzymes: No results for input(s): CKTOTAL, CKMB, CKMBINDEX, TROPONINI in the last 168 hours.  BNP (last 3 results) No results for input(s): PROBNP in the last 8760 hours.  Lipid Profile: No results for input(s): CHOL, HDL, LDLCALC, TRIG, CHOLHDL, LDLDIRECT in the last 72 hours.  Thyroid Function Tests: No results for input(s): TSH, T4TOTAL, FREET4, T3FREE, THYROIDAB in the last 72 hours.  Anemia Panel: No results for  input(s): VITAMINB12, FOLATE, FERRITIN, TIBC, IRON, RETICCTPCT in the last 72 hours.  Urine analysis:    Component Value Date/Time   COLORURINE YELLOW 07/20/2020 2355   APPEARANCEUR HAZY (A) 07/20/2020 2355   LABSPEC 1.014 07/20/2020 2355   PHURINE 5.0 07/20/2020 2355   GLUCOSEU NEGATIVE 07/20/2020 2355   HGBUR LARGE (A) 07/20/2020 2355   BILIRUBINUR NEGATIVE 07/20/2020 2355   KETONESUR 5 (A) 07/20/2020 2355   PROTEINUR NEGATIVE 07/20/2020 2355   UROBILINOGEN 1.0 05/10/2011 1440   NITRITE NEGATIVE 07/20/2020 2355   LEUKOCYTESUR TRACE (A) 07/20/2020 2355    Sepsis Labs: Lactic Acid, Venous    Component Value Date/Time   LATICACIDVEN 1.8 07/21/2020 0107    MICROBIOLOGY: Recent Results (from the past 240 hour(s))  Urine culture     Status: None   Collection Time: 07/20/20  7:05 PM   Specimen: In/Out Cath Urine  Result Value Ref Range Status   Specimen Description IN/OUT CATH URINE  Final   Special Requests NONE  Final   Culture   Final    NO GROWTH Performed at [facility], [address], [place], [place]    Report Status 07/23/2020 FINAL  Final  Blood Culture (routine x 2)     Status: None   Collection Time: 07/20/20  7:33 PM   Specimen: BLOOD  Result Value Ref Range Status   Specimen Description BLOOD SITE NOT SPECIFIED  Final   Special Requests   Final    BOTTLES DRAWN AEROBIC AND ANAEROBIC Blood Culture results may not be optimal due to an inadequate volume of blood received in culture bottles   Culture   Final    NO GROWTH 5 DAYS Performed at [facility], [address], [place], [place]    Report Status 07/25/2020 FINAL  Final  Resp Panel by RT-PCR (  Flu A&B, Covid) Nasopharyngeal Swab     Status: None   Collection Time: 07/20/20  7:46 PM   Specimen: Nasopharyngeal Swab; Nasopharyngeal(NP) swabs in vial transport medium  Result Value Ref Range Status   SARS Coronavirus 2 by RT PCR NEGATIVE NEGATIVE Final    Comment:  (NOTE) SARS-CoV-2 target nucleic acids are NOT DETECTED.  The SARS-CoV-2 RNA is generally detectable in upper respiratory specimens during the acute phase of infection. The lowest concentration of SARS-CoV-2 viral copies this assay can detect is 138 copies/mL. A negative result does not preclude SARS-Cov-2 infection and should not be used as the sole basis for treatment or other patient management decisions. A negative result may occur with  improper specimen collection/handling, submission of specimen other than nasopharyngeal swab, presence of viral mutation(s) within the areas targeted by this assay, and inadequate number of viral copies(<138 copies/mL). A negative result must be combined with clinical observations, patient history, and epidemiological information. The expected result is Negative.  Fact Sheet for Patients:  BloggerCourse.comhttps://www.fda.gov/media/152166/download  Fact Sheet for Healthcare Providers:  SeriousBroker.ithttps://www.fda.gov/media/152162/download  This test is no t yet approved or cleared by the [place] FDA and  has been authorized for detection and/or diagnosis of SARS-CoV-2 by FDA under an Emergency Use Authorization (EUA). This EUA will remain  in effect (meaning this test can be used) for the duration of the COVID-19 declaration under Section 564(b)(1) of the Act, 21 U.S.C.section 360bbb-3(b)(1), unless the authorization is terminated  or revoked sooner.       Influenza A by PCR NEGATIVE NEGATIVE Final   Influenza B by PCR NEGATIVE NEGATIVE Final    Comment: (NOTE) The Xpert Xpress SARS-CoV-2/FLU/RSV plus assay is intended as an aid in the diagnosis of influenza from Nasopharyngeal swab specimens and should not be used as a sole basis for treatment. Nasal washings and aspirates are unacceptable for Xpert Xpress SARS-CoV-2/FLU/RSV testing.  Fact Sheet for Patients: BloggerCourse.comhttps://www.fda.gov/media/152166/download  Fact Sheet for Healthcare  Providers: SeriousBroker.ithttps://www.fda.gov/media/152162/download  This test is not yet approved or cleared by the [place] FDA and has been authorized for detection and/or diagnosis of SARS-CoV-2 by FDA under an Emergency Use Authorization (EUA). This EUA will remain in effect (meaning this test can be used) for the duration of the COVID-19 declaration under Section 564(b)(1) of the Act, 21 U.S.C. section 360bbb-3(b)(1), unless the authorization is terminated or revoked.  Performed at [facility], [address] [place], [place] [address]   Blood Culture (routine x 2)     Status: None   Collection Time: 07/20/20  7:47 PM   Specimen: BLOOD  Result Value Ref Range Status   Specimen Description BLOOD SITE NOT SPECIFIED  Final   Special Requests   Final    BOTTLES DRAWN AEROBIC AND ANAEROBIC Blood Culture results may not be optimal due to an inadequate volume of blood received in culture bottles   Culture   Final    NO GROWTH 5 DAYS Performed at [facility], [address] [place], [place] [address]    Report Status 07/25/2020 FINAL  Final  MRSA PCR Screening     Status: None   Collection Time: 07/24/20  7:14 AM   Specimen: Nasal Mucosa; Nasopharyngeal  Result Value Ref Range Status   MRSA by PCR NEGATIVE NEGATIVE Final    Comment:        The GeneXpert MRSA Assay (FDA approved for NASAL specimens only), is one component of a comprehensive MRSA colonization surveillance program. It  is not intended to diagnose MRSA infection nor to guide or monitor treatment for MRSA infections. Performed at [facility], [address] [place], [place] [address]     RADIOLOGY STUDIES/RESULTS: No results found.   LOS: 7 days   Jeoffrey Massed, MD  [facility]    To contact the attending provider between 7A-7P or the covering provider during after hours 7P-7A, please log into the web site [email or web address] and access using universal [place] password for that web  site. If you do not have the password, please call the hospital operator.  07/27/2020, 10:56 AM

## 2020-07-27 NOTE — Anesthesia Postprocedure Evaluation (Signed)
Anesthesia Post Note  Patient: Deborah [name]  Procedure(s) Performed: ESOPHAGOGASTRODUODENOSCOPY (EGD) WITH PROPOFOL (N/A ) BIOPSY     Patient location during evaluation: PACU Anesthesia Type: MAC Level of consciousness: awake and alert Pain management: pain level controlled Vital Signs Assessment: post-procedure vital signs reviewed and stable Respiratory status: spontaneous breathing Cardiovascular status: stable Anesthetic complications: no   No complications documented.  Last Vitals:  Vitals:   07/27/20 0342 07/27/20 0558  BP: 95/64   Pulse: 62 63  Resp: 18 19  Temp: 37.4 C   SpO2: 100% 100%    Last Pain:  Vitals:   07/27/20 0400  TempSrc:   PainSc: [place]

## 2020-07-28 LAB — HEPARIN LEVEL (UNFRACTIONATED): Heparin Unfractionated: 0.45 IU/mL (ref 0.30–0.70)

## 2020-07-28 LAB — CBC
HCT: 25.1 % — ABNORMAL LOW (ref 36.0–46.0)
Hemoglobin: 8.2 g/dL — ABNORMAL LOW (ref 12.0–15.0)
MCH: 31.5 pg (ref 26.0–34.0)
MCHC: 32.7 g/dL (ref 30.0–36.0)
MCV: 96.5 fL (ref 80.0–100.0)
Platelets: 180 10*3/uL (ref 150–400)
RBC: 2.6 MIL/uL — ABNORMAL LOW (ref 3.87–5.11)
RDW: 15.2 % (ref 11.5–15.5)
WBC: 8.9 10*3/uL (ref 4.0–10.5)
nRBC: 0 % (ref 0.0–0.2)

## 2020-07-28 LAB — PROTIME-INR
INR: 1.4 — ABNORMAL HIGH (ref 0.8–1.2)
Prothrombin Time: 16.7 seconds — ABNORMAL HIGH (ref 11.4–15.2)

## 2020-07-28 LAB — GLUCOSE, CAPILLARY
Glucose-Capillary: 79 mg/dL (ref 70–99)
Glucose-Capillary: 130 mg/dL — ABNORMAL HIGH (ref 70–99)
Glucose-Capillary: 144 mg/dL — ABNORMAL HIGH (ref 70–99)
Glucose-Capillary: 117 mg/dL — ABNORMAL HIGH (ref 70–99)

## 2020-07-28 MED ORDER — WARFARIN SODIUM 5 MG PO TABS
10.0000 mg | ORAL_TABLET | Freq: Once | ORAL | Status: AC
  Administered 2020-07-28: 10 mg via ORAL
  Filled 2020-07-28: qty 2

## 2020-07-28 NOTE — Progress Notes (Cosign Needed)
Need to adjust the H Pylori eradication regimen as Metronidazole likely to potentiate Warfarin and lead to coagulopathy so suggest switch to regimen of  Protonix 40 mg  po bid  clarithromycin 500 mg po bid Amoxil 1 g bid for 10 days.    Jennye Moccasin PA-C

## 2020-07-28 NOTE — Progress Notes (Signed)
Physical Therapy Treatment Patient Details Name: Deborah [name] MRN: [id number] DOB: [birth year] Today's Date: 07/28/2020    History of Present Illness 75 y.o. female  who is admitted to [facility] on 07/20/2020 with acute upper gastrointestinal bleed after presenting 70from home to [facility] ED complaining of nausea and vomiting. PMH: chronic systolic heart failure with most recent EF 20 to 25% per echocardiogram in March 2020, severe mitral regurgitation status post mechanical mitral valve replacement 2005, type 2 diabetes mellitus, hypertension, anemia of chronic disease with baseline hemoglobin of 11,    PT Comments    Pt sitting up in chair on entry with son in room. Pt's son was hopeful pt would be discharged today so he could get her settled before [place] of [place]. Pt is progressing towards her goals today, however after trial of ambulation without AD, therapist and pt agree RW use until she is stronger is needed. D/c plans remain appropriate at this time. PT will continue to follow acutely.    Follow Up Recommendations  Home health PT;Supervision - Intermittent     Equipment Recommendations  None recommended by PT (pt has necessary equipment)       Precautions / Restrictions Precautions Precautions: Fall Restrictions Weight Bearing Restrictions: No    Mobility  Bed Mobility               General bed mobility comments: OOB in recliner    Transfers Overall transfer level: Needs assistance Equipment used: None Transfers: Sit to/from Stand Sit to Stand: Supervision         General transfer comment: supervision for safety, pt stood for a little while before starting ambulation  Ambulation/Gait Ambulation/Gait assistance: Min guard;Min assist Gait Distance (Feet): 200 Feet Assistive device: Rolling walker (2 wheeled);None Gait Pattern/deviations: Step-through pattern;Decreased step length - right;Decreased step length - left;Shuffle Gait  velocity: slowed Gait velocity interpretation: <1.31 ft/sec, indicative of household ambulator General Gait Details: min guard for ambulation using RW, pt wanted to trial ambulation without AD and was able to ambulate 10 feet without RW, pt started strong but by 8 feet was reaching out for assist. min A provided to steady and lead pt to handrail so that therapist could retrieve the RW to return to the room          Balance Overall balance assessment: Needs assistance Sitting-balance support: Feet supported;No upper extremity supported Sitting balance-Leahy Scale: Good     Standing balance support: No upper extremity supported Standing balance-Leahy Scale: Fair Standing balance comment: needs UE support for dynamic balance                            Cognition Arousal/Alertness: Awake/alert Behavior During Therapy: WFL for tasks assessed/performed Overall Cognitive Status: Within Functional Limits for tasks assessed                                           General Comments General comments (skin integrity, edema, etc.): Pt's son in room on entry, hopeful to be able to take mother home and get her settled before having to return home for work tomorrow.      Pertinent Vitals/Pain Pain Assessment: No/denies pain           PT Goals (current goals can now be found in the care plan section) Acute Rehab  PT Goals Patient Stated Goal: be able to walk for pleasure PT Goal Formulation: With patient Time For Goal Achievement: 08/08/20 Potential to Achieve Goals: Good Progress towards PT goals: Progressing toward goals    Frequency    Min 3X/week      PT Plan Current plan remains appropriate       AM-PAC PT "6 Clicks" Mobility   Outcome Measure  Help needed turning from your back to your side while in a flat bed without using bedrails?: None Help needed moving from lying on your back to sitting on the side of a flat bed without using  bedrails?: None Help needed moving to and from a bed to a chair (including a wheelchair)?: None Help needed standing up from a chair using your arms (e.g., wheelchair or bedside chair)?: None Help needed to walk in hospital room?: A Little Help needed climbing 3-5 steps with a railing? : A Little 6 Click Score: 22    End of Session Equipment Utilized During Treatment: Gait belt Activity Tolerance: Patient tolerated treatment well Patient left: in chair;with call bell/phone within reach Nurse Communication: Mobility status PT Visit Diagnosis: Muscle weakness (generalized) (M62.81);Difficulty in walking, not elsewhere classified (R26.2)     Time: 5188-4166 PT Time Calculation (min) (ACUTE ONLY): 25 min  Charges:  $Gait Training: 8-22 mins $Self Care/Home Management: 8-22                     Deborah [name] B. Beverely Risen PT, DPT Acute Rehabilitation Services Pager [phone] Office [phone]    Deborah [name] 07/28/2020, 1:05 PM

## 2020-07-28 NOTE — Progress Notes (Signed)
ANTICOAGULATION CONSULT NOTE  Pharmacy Consult for heparin/coumadin Indication: mechanical mitral valve  Labs: Recent Labs    07/26/20 0317 07/26/20 1118 07/26/20 1715 07/27/20 0114 07/28/20 0024  HGB 8.6*  --  9.5* 8.4* 8.2*  HCT 26.3*  --  29.0* 25.7* 25.1*  PLT 137*  --  184 164 180  LABPROT 15.0  --   --  15.3* 16.7*  INR 1.2  --   --  1.2 1.4*  HEPARINUNFRC 0.45 0.36  --  0.55 0.45   Assessment: 75 yo F on warfarin PTA (Per med rec: patient takes warfarin 7.5mg  PO every day) to start heparin drip while INR is subtherapeutic. Patient s/p EGD with gastritis and non-bleeding ulcers. Heparin restarted after EGD. Pt had 1 episode of tarry stool and heparin was held 4/16 at  0334. Received request from MD to restart heparin infusion for anticoagulation. Will continue to aim for low 0.3-0.5 with no bolus.    HL is therapeutic this AM. INR trended up slightly to 1.4. Hgb remains in 8s. He is also H.pylori positive. GI is planning on treating with Protonix 40 bid, Bismuth subsalicylate 300 mg qid, Metronidazole 250 mg qid, Tetracycline 500 mg qid. However, the metronidazole will be an issue with coumadin. Will reach to see if we can use an alternative regimen.   Goal of Therapy:  Heparin level 0.3-0.5 units/ml INR goal 2.5-3.5 for warfarin therapy Monitor platelets by anticoagulation protocol: Yes   Plan:  Cont heparin gtt 600 units/hr  Daily HL and INR Warfarin 10mg  po x1 tonight  , PharmD, [place], AAHIVP, CPP Infectious Disease Pharmacist 07/28/2020 9:13 AM

## 2020-07-28 NOTE — Progress Notes (Signed)
PROGRESS NOTE        PATIENT DETAILS Name: Deborah [name] Age: 75 y.o. Sex: female Date of Birth: 05/20/1945 Admit Date: 07/20/2020 Admitting Physician Angie Fava, DO [id number], Campbell Lerner, MD  Brief Narrative: Patient is a 75 y.o. female with history of chronic systolic heart failure, mitral valve replacement (mechanical valve)-prior history of GI bleeding due to gastric/duodenal ulcers-presenting with dark-colored FOBT positive stools and a hemoglobin of 6.9-in the setting of supratherapeutic INR.  See below for further details.  Significant events: 4/11>> admit for upper GI bleeding-acute blood loss anemia-supratherapeutic INR. 4/15>> EGD  Significant studies: 4/11>> chest x-ray: Possible airspace disease in the left base 4/13>> Echo: EF 20-25%, old septal segment/basal inferior wall appears akinetic, rest of the LV wall severely hypokinetic.  Antimicrobial therapy: Vancomycin: 4/11 x 1 Flagyl: 4/11 x 1 Cefepime: 4/11 x 1  Microbiology data: 4/11>> blood culture: No growth 4/11>> Covid/influenza PCR: Negative  Procedures : 4/15>> EGD: Nonbleeding gastric ulcer, nonbleeding duodenal ulcer.  Duodenitis  Consults: GI, cardiology  DVT Prophylaxis : SCDs Start: 07/20/20 2158  IV heparin   Subjective: Had BM still dark but not as dark as the past few days-she thinks stools are now lightening up.  Assessment/Plan: Upper GI bleeding with acute blood loss anemia: Did not have BM for 4 days post hospitalization-has had a few BMs post EGD that were dark in color-her last BM was on 4/18-although dark-it is apparently no with a lighter shade.  Hemoglobin relatively stable.  Suspect that dark stools post EGD is due to old blood making its way out.  Continue PPI-remains on overlapping heparin and Coumadin.  EGD findings as above.  H. pylori infection: GI recommending Protonix/clarithromycin/amoxicillin-on discharge.  AKI: Hemodynamically  mediated-slowly improving-follow periodically.  Thrombocytopenia: Resolved.  Chronic systolic heart failure: Euvolemic-tolerating Coreg-resume Entresto in the next few days.  History of severe mitral regurgitation-s/p mechanical mitral valve replacement: Currently on IV heparin/overlapping Coumadin-watch closely for any recurrence of GI bleeding-see above.  Low risk findings on EGD done a few days back.  Cardiology following and recommending inpatient monitoring-to ensure hemoglobin stability-and no recurrence of GI bleeding  Obesity: Estimated body mass index is 27.1 kg/m as calculated from the following:   Height as of this encounter: 5\' 3"  (1.6 m).   Weight as of this encounter: 69.4 kg.    Diet: Diet Order            Diet heart healthy/carb modified Room service appropriate? No; Fluid consistency: Thin  Diet effective now                  Code Status: Full code   Family Communication: Sister-Norma [phone] voicemail on 4/14, 4/15,4/16, 4/18  Disposition Plan: Status is: Inpatient  Remains inpatient appropriate because:Inpatient level of care appropriate due to severity of illness   Dispo: The patient is from: Home              Anticipated d/c is to: Home              Patient currently is not medically stable to d/c.   Difficult to place patient No    Barriers to Discharge: Recent GI bleeding-mechanical mitral valve-having black stools post EGD but suspect this is old blood-hemoglobin fluctuating-needs to remain inpatient until we are sure that patient does not have recurrent bleeding while anticoagulation is being  restarted.  Currently on IV heparin with overlapping Coumadin.  Antimicrobial agents: Anti-infectives (From admission, onward)   Start     Dose/Rate Route Frequency Ordered Stop   07/21/20 2000  ceFEPIme (MAXIPIME) 2 g in sodium chloride 0.9 % 100 mL IVPB  Status:  Discontinued        2 g 200 mL/hr over 30 Minutes Intravenous Every 24 hours  07/20/20 2037 07/20/20 2156   07/21/20 2000  ceFEPIme (MAXIPIME) 2 g in sodium chloride 0.9 % 100 mL IVPB  Status:  Discontinued        2 g 200 mL/hr over 30 Minutes Intravenous Every 24 hours 07/21/20 0234 07/21/20 1604   07/21/20 0330  vancomycin (VANCOREADY) IVPB 750 mg/150 mL  Status:  Discontinued        750 mg 150 mL/hr over 60 Minutes Intravenous Every 48 hours 07/21/20 0234 07/21/20 1604   07/20/20 1915  ceFEPIme (MAXIPIME) 2 g in sodium chloride 0.9 % 100 mL IVPB        2 g 200 mL/hr over 30 Minutes Intravenous  Once 07/20/20 1905 07/20/20 2029   07/20/20 1915  metroNIDAZOLE (FLAGYL) IVPB 500 mg        500 mg 100 mL/hr over 60 Minutes Intravenous  Once 07/20/20 1905 07/20/20 2137       Time spent: 25- minutes-Greater than 50% of this time was spent in counseling, explanation of diagnosis, planning of further management, and coordination of care.  MEDICATIONS: Scheduled Meds: . atorvastatin  40 mg Oral Daily  . carvedilol  6.25 mg Oral BID WC  . insulin aspart  0-5 Units Subcutaneous QHS  . insulin aspart  0-6 Units Subcutaneous TID WC  . pantoprazole  40 mg Oral BID  . warfarin  10 mg Oral ONCE-1600  . Warfarin - Pharmacist Dosing Inpatient   Does not apply q1600   Continuous Infusions: . heparin 600 Units/hr (07/27/20 1639)   PRN Meds:.acetaminophen, ondansetron (ZOFRAN) IV, polyethylene glycol   PHYSICAL EXAM: Vital signs: Vitals:   07/27/20 2015 07/27/20 2354 07/28/20 0409 07/28/20 0736  BP: 117/78 108/60 (!) 99/57 (!) 109/53  Pulse: 84 69 71 79  Resp: 19 17 17  (!) 23  Temp: 99 F (37.2 C) 98.9 F (37.2 C) 98.7 F (37.1 C) 98 F (36.7 C)  TempSrc: Axillary Axillary Axillary Oral  SpO2: 100% 98% 99% 96%  Weight:      Height:       Filed Weights   07/25/20 0357 07/26/20 0514 07/27/20 0558  Weight: 68.9 kg 69.2 kg 69.4 kg   Body mass index is 27.1 kg/m.   Gen Exam:Alert awake-not in any distress HEENT:atraumatic, normocephalic Chest: B/L clear to  auscultation anteriorly CVS:S1S2 regular Abdomen:soft non tender, non distended Extremities:no edema Neurology: Non focal Skin: no rash  I have personally reviewed following labs and imaging studies  LABORATORY DATA: CBC: Recent Labs  Lab 07/25/20 1558 07/26/20 0317 07/26/20 1715 07/27/20 0114 07/28/20 0024  WBC 12.8* 10.1 10.5 9.7 8.9  HGB 9.9* 8.6* 9.5* 8.4* 8.2*  HCT 30.1* 26.3* 29.0* 25.7* 25.1*  MCV 95.9 96.0 95.4 96.3 96.5  PLT 151 137* 184 164 180    Basic Metabolic Panel: Recent Labs  Lab 07/22/20 0630 07/23/20 0058 07/24/20 0034 07/25/20 0014  NA 144 141 138 141  K 4.3 4.0 4.2 4.2  CL 112* 111 110 111  CO2 28 27 28 28   GLUCOSE 112* 108* 106* 85  BUN 52* 50* 49* 35*  CREATININE 1.29* 1.33* 1.35*  1.16*  CALCIUM 8.4* 8.0* 7.9* 8.1*  MG 1.7  --   --   --   PHOS 2.6  --   --   --     GFR: Estimated Creatinine Clearance: 38.6 mL/min (A) (by C-G formula based on SCr of 1.16 mg/dL (H)).  Liver Function Tests: No results for input(s): AST, ALT, ALKPHOS, BILITOT, PROT, ALBUMIN in the last 168 hours. No results for input(s): LIPASE, AMYLASE in the last 168 hours. No results for input(s): AMMONIA in the last 168 hours.  Coagulation Profile: Recent Labs  Lab 07/24/20 1926 07/25/20 0014 07/26/20 0317 07/27/20 0114 07/28/20 0024  INR 1.4* 1.3* 1.2 1.2 1.4*    Cardiac Enzymes: No results for input(s): CKTOTAL, CKMB, CKMBINDEX, TROPONINI in the last 168 hours.  BNP (last 3 results) No results for input(s): PROBNP in the last 8760 hours.  Lipid Profile: No results for input(s): CHOL, HDL, LDLCALC, TRIG, CHOLHDL, LDLDIRECT in the last 72 hours.  Thyroid Function Tests: No results for input(s): TSH, T4TOTAL, FREET4, T3FREE, THYROIDAB in the last 72 hours.  Anemia Panel: No results for input(s): VITAMINB12, FOLATE, FERRITIN, TIBC, IRON, RETICCTPCT in the last 72 hours.  Urine analysis:    Component Value Date/Time   COLORURINE YELLOW 07/20/2020  2355   APPEARANCEUR HAZY (A) 07/20/2020 2355   LABSPEC 1.014 07/20/2020 2355   PHURINE 5.0 07/20/2020 2355   GLUCOSEU NEGATIVE 07/20/2020 2355   HGBUR LARGE (A) 07/20/2020 2355   BILIRUBINUR NEGATIVE 07/20/2020 2355   KETONESUR 5 (A) 07/20/2020 2355   PROTEINUR NEGATIVE 07/20/2020 2355   UROBILINOGEN 1.0 05/10/2011 1440   NITRITE NEGATIVE 07/20/2020 2355   LEUKOCYTESUR TRACE (A) 07/20/2020 2355    Sepsis Labs: Lactic Acid, Venous    Component Value Date/Time   LATICACIDVEN 1.8 07/21/2020 0107    MICROBIOLOGY: Recent Results (from the past 240 hour(s))  Urine culture     Status: None   Collection Time: 07/20/20  7:05 PM   Specimen: In/Out Cath Urine  Result Value Ref Range Status   Specimen Description IN/OUT CATH URINE  Final   Special Requests NONE  Final   Culture   Final    NO GROWTH Performed at [facility], [address] [place], [place] [address]    Report Status 07/23/2020 FINAL  Final  Blood Culture (routine x 2)     Status: None   Collection Time: 07/20/20  7:33 PM   Specimen: BLOOD  Result Value Ref Range Status   Specimen Description BLOOD SITE NOT SPECIFIED  Final   Special Requests   Final    BOTTLES DRAWN AEROBIC AND ANAEROBIC Blood Culture results may not be optimal due to an inadequate volume of blood received in culture bottles   Culture   Final    NO GROWTH 5 DAYS Performed at [facility], [address], [place], [place] [address]    Report Status 07/25/2020 FINAL  Final  Resp Panel by RT-PCR (Flu A&B, Covid) Nasopharyngeal Swab     Status: None   Collection Time: 07/20/20  7:46 PM   Specimen: Nasopharyngeal Swab; Nasopharyngeal(NP) swabs in vial transport medium  Result Value Ref Range Status   SARS Coronavirus 2 by RT PCR NEGATIVE NEGATIVE Final    Comment: (NOTE) SARS-CoV-2 target nucleic acids are NOT DETECTED.  The SARS-CoV-2 RNA is generally detectable in upper respiratory specimens during the acute phase of infection.  The lowest concentration of SARS-CoV-2 viral copies this assay can detect is 138  copies/mL. A negative result does not preclude SARS-Cov-2 infection and should not be used as the sole basis for treatment or other patient management decisions. A negative result may occur with  improper specimen collection/handling, submission of specimen other than nasopharyngeal swab, presence of viral mutation(s) within the areas targeted by this assay, and inadequate number of viral copies(<138 copies/mL). A negative result must be combined with clinical observations, patient history, and epidemiological information. The expected result is Negative.  Fact Sheet for Patients:  BloggerCourse.com  Fact Sheet for Healthcare Providers:  SeriousBroker.it  This test is no t yet approved or cleared by the [place] FDA and  has been authorized for detection and/or diagnosis of SARS-CoV-2 by FDA under an Emergency Use Authorization (EUA). This EUA will remain  in effect (meaning this test can be used) for the duration of the COVID-19 declaration under Section 564(b)(1) of the Act, 21 U.S.C.section 360bbb-3(b)(1), unless the authorization is terminated  or revoked sooner.       Influenza A by PCR NEGATIVE NEGATIVE Final   Influenza B by PCR NEGATIVE NEGATIVE Final    Comment: (NOTE) The Xpert Xpress SARS-CoV-2/FLU/RSV plus assay is intended as an aid in the diagnosis of influenza from Nasopharyngeal swab specimens and should not be used as a sole basis for treatment. Nasal washings and aspirates are unacceptable for Xpert Xpress SARS-CoV-2/FLU/RSV testing.  Fact Sheet for Patients: BloggerCourse.com  Fact Sheet for Healthcare Providers: SeriousBroker.it  This test is not yet approved or cleared by the [place] FDA and has been authorized for detection and/or diagnosis of SARS-CoV-2 by FDA  under an Emergency Use Authorization (EUA). This EUA will remain in effect (meaning this test can be used) for the duration of the COVID-19 declaration under Section 564(b)(1) of the Act, 21 U.S.C. section 360bbb-3(b)(1), unless the authorization is terminated or revoked.  Performed at [facility], [address] [place], [place] [address]   Blood Culture (routine x 2)     Status: None   Collection Time: 07/20/20  7:47 PM   Specimen: BLOOD  Result Value Ref Range Status   Specimen Description BLOOD SITE NOT SPECIFIED  Final   Special Requests   Final    BOTTLES DRAWN AEROBIC AND ANAEROBIC Blood Culture results may not be optimal due to an inadequate volume of blood received in culture bottles   Culture   Final    NO GROWTH 5 DAYS Performed at [facility], [address] [place], [place] [address]    Report Status 07/25/2020 FINAL  Final  MRSA PCR Screening     Status: None   Collection Time: 07/24/20  7:14 AM   Specimen: Nasal Mucosa; Nasopharyngeal  Result Value Ref Range Status   MRSA by PCR NEGATIVE NEGATIVE Final    Comment:        The GeneXpert MRSA Assay (FDA approved for NASAL specimens only), is one component of a comprehensive MRSA colonization surveillance program. It is not intended to diagnose MRSA infection nor to guide or monitor treatment for MRSA infections. Performed at [facility], [address], [place], [place] [address]     RADIOLOGY STUDIES/RESULTS: No results found.   LOS: 8 days   Jeoffrey Massed, MD  [facility]    To contact the attending provider between 7A-7P or the covering provider during after hours 7P-7A, please log into the web site [email or web address] and access using universal [place] password for that web site. If you do  not have the password, please call the hospital operator.  07/28/2020, 10:07 AM

## 2020-07-28 NOTE — Progress Notes (Signed)
Progress Note  Patient Name: Deborah [name] Date of Encounter: 07/28/2020  [facility] Cardiologist: Arvilla Meres, MD   Subjective   Patient denies CP   Breathing is OK  Eager to go home   Inpatient Medications    Scheduled Meds: . atorvastatin  40 mg Oral Daily  . carvedilol  6.25 mg Oral BID WC  . insulin aspart  0-5 Units Subcutaneous QHS  . insulin aspart  0-6 Units Subcutaneous TID WC  . pantoprazole  40 mg Oral BID  . Warfarin - Pharmacist Dosing Inpatient   Does not apply q1600   Continuous Infusions: . heparin 600 Units/hr (07/27/20 1639)   PRN Meds: acetaminophen, ondansetron (ZOFRAN) IV, polyethylene glycol   Vital Signs    Vitals:   07/27/20 1700 07/27/20 2015 07/27/20 2354 07/28/20 0409  BP: 112/60 117/78 108/60 (!) 99/57  Pulse: 71 84 69 71  Resp: 16 19 17 17   Temp:  99 F (37.2 C) 98.9 F (37.2 C) 98.7 F (37.1 C)  TempSrc:  Axillary Axillary Axillary  SpO2: 99% 100% 98% 99%  Weight:      Height:        Intake/Output Summary (Last 24 hours) at 07/28/2020 0611 Last data filed at 07/27/2020 1639 Gross per 24 hour  Intake 324.68 ml  Output 450 ml  Net -125.32 ml   Last 3 Weights 07/27/2020 07/26/2020 07/25/2020  Weight (lbs) 153 lb 152 lb 8.9 oz 151 lb 14.4 oz  Weight (kg) 69.4 kg 69.2 kg 68.9 kg      Telemetry    Sinus rhythm- Personally Reviewed  ECG    No new tracings - Personally Reviewed  Physical Exam   GEN: No acute distress.   Neck: No JVD Cardiac: RRR, mechanical click No signif murmurs, Respiratory: Clear to auscultation bilaterally. GI: Soft, nontender, non-distended  MS: No edema; No deformity. Neuro:  Nonfocal    Labs    High Sensitivity Troponin:  No results for input(s): TROPONINIHS in the last 720 hours.    Chemistry Recent Labs  Lab 07/21/20 0929 07/22/20 0630 07/23/20 0058 07/24/20 0034 07/25/20 0014  NA 144   < > 141 138 141  K 4.2   < > 4.0 4.2 4.2  CL 114*   < > 111 110 111  CO2 23   < > 27  28 28   GLUCOSE 188*   < > 108* 106* 85  BUN 64*   < > 50* 49* 35*  CREATININE 1.60*   < > 1.33* 1.35* 1.16*  CALCIUM 8.2*   < > 8.0* 7.9* 8.1*  PROT 5.0*  --   --   --   --   ALBUMIN 3.0*  --   --   --   --   AST 16  --   --   --   --   ALT 13  --   --   --   --   ALKPHOS 35*  --   --   --   --   BILITOT 0.7  --   --   --   --   GFRNONAA 33*   < > 41* 41* 49*  ANIONGAP 7   < > 3* 0* 2*   < > = values in this interval not displayed.     Hematology Recent Labs  Lab 07/26/20 1715 07/27/20 0114 07/28/20 0024  WBC 10.5 9.7 8.9  RBC 3.04* 2.67* 2.60*  HGB 9.5* 8.4* 8.2*  HCT 29.0* 25.7*  25.1*  MCV 95.4 96.3 96.5  MCH 31.3 31.5 31.5  MCHC 32.8 32.7 32.7  RDW 15.6* 15.4 15.2  PLT 184 164 180    BNPNo results for input(s): BNP, PROBNP in the last 168 hours.   DDimer No results for input(s): DDIMER in the last 168 hours.   Radiology    No results found.  Cardiac Studies   ECHO 07/22/20   1. Left ventricular ejection fraction, by estimation, is 20 to 25%. The  left ventricle has severely decreased function. The left ventricle  demonstrates regional wall motion abnormalities.   All septal segments and the basal inferior segment appear akinetic.  The rest of the LV walls appears severely hypokinetic. The left  ventricular internal cavity size was moderately dilated. Diastolic  function indeterminant due to mechanical mitral  valve.  2. Right ventricular systolic function is normal. The right ventricular  size is normal. There is moderately elevated pulmonary artery systolic  pressure. The estimated right ventricular systolic pressure is 45.8 mmHg.  3. Left atrial size was severely dilated.  4. The mitral valve has been repaired/replaced. A mechanical [employer]. Jude  mitral valve is present and appears well seated with normal function by  doppler interrogation. Mean gradient at HR 86bpm. There is trivial  mitral regurgitation.  5. The aortic valve is  tricuspid. There is mild thickening of the aortic  valve. Aortic valve regurgitation is not visualized. Mild aortic valve  sclerosis is present, with no evidence of aortic valve stenosis.  6. The inferior vena cava is normal in size with greater than 50%  respiratory variability, suggesting right atrial pressure of 3 mmHg.   Comparison(s): No significant change from prior study.   Patient Profile     Deborah [name] a 75 y.o.femalewith a hx of NICM w/ EF 25%, STJ singe chamber ICD, STJ MVR 2005 on coumadin, DM, HTN, HLD, nephrolithiasis, S-CHF, mild AS,ABL anemia requiring 3 PRBCs2016 w/ gastric and duodenal ulcers on EGD (INR 4.5),who is being followed by cardiology for the management of anticoagulation  Assessment & Plan    1. Mitral valve disease s/p mechanical MVR in 2005: patient has been on coumadin long term following MVR. Presented with acute on chronic anemia in the setting of GI bleed and supratherapeutic INR.  She is now getting back on coumadin   INR today is 1.4 It would be good to get INR a little higher, closer to 2 before sending home    WIll need close f/u   - Continue coumadin per pharmacy - goal INR 2.5-3.5 - 2. Chronic combined CHF: longstanding NICM with EF 20-25% on echo this admission. Home carvedilol and entresto initially on hold, now back on carvedilol with ongoing soft blood pressures. She appears euvolemic on exam - Continue carvedilol at 6.25    Would add Entresto 24/26 today and follow BP   -   3. Acute on chronic anemia in the setting of GI bleed and supratherapeutic INR: Patient presented with N/V and + occult stool. Required 3u PRBC this admission.  Found to have gastritis and non-bleeding stomach/duodenal ulcers on EGD this admission.  Bx positive for H Pylori Rx PPI, ABX Bismuth    4. HTN: BP is a little better  Add back Entresto    5. HLD: no recent lipids on file - Continue atorvastatin  6. DM type 2: appears to be diet controlled at  home. No recent A1C on file - Continue management per primary team  Again, patient  eager to go home  Needs to ambulate some, follow BP on meds     Check labs in AM        For questions or updates, please contact [facility] Please consult [email or web address] for contact info under        Signed, Dietrich Pates, MD  07/28/2020, 6:11 AM

## 2020-07-29 LAB — CBC
HCT: 25.5 % — ABNORMAL LOW (ref 36.0–46.0)
Hemoglobin: 8.2 g/dL — ABNORMAL LOW (ref 12.0–15.0)
MCH: 30.9 pg (ref 26.0–34.0)
MCHC: 32.2 g/dL (ref 30.0–36.0)
MCV: 96.2 fL (ref 80.0–100.0)
Platelets: 197 10*3/uL (ref 150–400)
RBC: 2.65 MIL/uL — ABNORMAL LOW (ref 3.87–5.11)
RDW: 15 % (ref 11.5–15.5)
WBC: 8.9 10*3/uL (ref 4.0–10.5)
nRBC: 0 % (ref 0.0–0.2)

## 2020-07-29 LAB — HEPARIN LEVEL (UNFRACTIONATED): Heparin Unfractionated: 0.47 IU/mL (ref 0.30–0.70)

## 2020-07-29 LAB — GLUCOSE, CAPILLARY
Glucose-Capillary: 87 mg/dL (ref 70–99)
Glucose-Capillary: 136 mg/dL — ABNORMAL HIGH (ref 70–99)
Glucose-Capillary: 133 mg/dL — ABNORMAL HIGH (ref 70–99)
Glucose-Capillary: 112 mg/dL — ABNORMAL HIGH (ref 70–99)

## 2020-07-29 LAB — PROTIME-INR
INR: 1.6 — ABNORMAL HIGH (ref 0.8–1.2)
Prothrombin Time: 18.9 seconds — ABNORMAL HIGH (ref 11.4–15.2)

## 2020-07-29 MED ORDER — SACUBITRIL-VALSARTAN 24-26 MG PO TABS
1.0000 | ORAL_TABLET | Freq: Two times a day (BID) | ORAL | Status: DC
  Administered 2020-07-29 – 2020-07-31 (×4): 1 via ORAL
  Filled 2020-07-29 (×6): qty 1

## 2020-07-29 MED ORDER — WARFARIN SODIUM 7.5 MG PO TABS
7.5000 mg | ORAL_TABLET | Freq: Once | ORAL | Status: AC
  Administered 2020-07-29: 7.5 mg via ORAL
  Filled 2020-07-29: qty 1

## 2020-07-29 NOTE — Progress Notes (Signed)
PROGRESS NOTE        PATIENT DETAILS Name: Deborah [name] Age: 75 y.o. Sex: female Date of Birth: March 22, 1946 Admit Date: 07/20/2020 Admitting Physician Angie Fava, DO KQA:[id number], Campbell Lerner, MD  Brief Narrative: Patient is a 75 y.o. female with history of chronic systolic heart failure, mitral valve replacement (mechanical valve)-prior history of GI bleeding due to gastric/duodenal ulcers-presenting with dark-colored FOBT positive stools and a hemoglobin of 6.9-in the setting of supratherapeutic INR.  See below for further details.  Significant events: 4/11>> admit for upper GI bleeding-acute blood loss anemia-supratherapeutic INR. 4/15>> EGD  Significant studies: 4/11>> chest x-ray: Possible airspace disease in the left base 4/13>> Echo: EF 20-25%, old septal segment/basal inferior wall appears akinetic, rest of the LV wall severely hypokinetic.  Antimicrobial therapy: Vancomycin: 4/11 x 1 Flagyl: 4/11 x 1 Cefepime: 4/11 x 1  Microbiology data: 4/11>> blood culture: No growth 4/11>> Covid/influenza PCR: Negative  Procedures : 4/15>> EGD: Nonbleeding gastric ulcer, nonbleeding duodenal ulcer.  Duodenitis  Consults: GI, cardiology  DVT Prophylaxis : SCDs Start: 07/20/20 2158  IV heparin   Subjective: No BM yesterday or today.  Anxious about going home.  Assessment/Plan: Upper GI bleeding with acute blood loss anemia: Did not have BM for 4 days post hospitalization-has had a few BMs post EGD that were dark in color-her last BM was on 4/18-although dark-per patient not as dark as BM compared to the past few days.  No BM since 4/18.  Hemoglobin stable.  Suspect black stools post EGD likely related to old blood that is making his way out.  On PPI-EGD findings as above.    H. pylori infection: GI recommending Protonix/clarithromycin/amoxicillin-on discharge.  AKI: Hemodynamically mediated-slowly improving-follow  periodically.  Thrombocytopenia: Resolved.  Chronic systolic heart failure: Euvolemic-tolerating Coreg-resume Entresto in the next few days.  History of severe mitral regurgitation-s/p mechanical mitral valve replacement: Currently on IV heparin/overlapping Coumadin-watch closely for any recurrence of GI bleeding-see above.  Low risk findings on EGD done a few days back.  Cardiology following and recommending inpatient monitoring-to ensure hemoglobin stability-and no recurrence of GI bleeding  Obesity: Estimated body mass index is 27.34 kg/m as calculated from the following:   Height as of this encounter: 5\' 3"  (1.6 m).   Weight as of this encounter: 70 kg.    Diet: Diet Order            Diet heart healthy/carb modified Room service appropriate? No; Fluid consistency: Thin  Diet effective now                  Code Status: Full code   Family Communication: Sister-Norma [phone] voicemail on 4/14, 4/15,4/16, 4/18  Disposition Plan: Status is: Inpatient  Remains inpatient appropriate because:Inpatient level of care appropriate due to severity of illness   Dispo: The patient is from: Home              Anticipated d/c is to: Home              Patient currently is not medically stable to d/c.   Difficult to place patient No    Barriers to Discharge: Recent GI bleeding-mechanical mitral valve-having black stools post EGD but suspect this is old blood-hemoglobin fluctuating-needs to remain inpatient until we are sure that patient does not have recurrent bleeding while anticoagulation is being restarted.  Currently on  IV heparin with overlapping Coumadin.  Antimicrobial agents: Anti-infectives (From admission, onward)   Start     Dose/Rate Route Frequency Ordered Stop   07/21/20 2000  ceFEPIme (MAXIPIME) 2 g in sodium chloride 0.9 % 100 mL IVPB  Status:  Discontinued        2 g 200 mL/hr over 30 Minutes Intravenous Every 24 hours 07/20/20 2037 07/20/20 2156    07/21/20 2000  ceFEPIme (MAXIPIME) 2 g in sodium chloride 0.9 % 100 mL IVPB  Status:  Discontinued        2 g 200 mL/hr over 30 Minutes Intravenous Every 24 hours 07/21/20 0234 07/21/20 1604   07/21/20 0330  vancomycin (VANCOREADY) IVPB 750 mg/150 mL  Status:  Discontinued        750 mg 150 mL/hr over 60 Minutes Intravenous Every 48 hours 07/21/20 0234 07/21/20 1604   07/20/20 1915  ceFEPIme (MAXIPIME) 2 g in sodium chloride 0.9 % 100 mL IVPB        2 g 200 mL/hr over 30 Minutes Intravenous  Once 07/20/20 1905 07/20/20 2029   07/20/20 1915  metroNIDAZOLE (FLAGYL) IVPB 500 mg        500 mg 100 mL/hr over 60 Minutes Intravenous  Once 07/20/20 1905 07/20/20 2137       Time spent: 25- minutes-Greater than 50% of this time was spent in counseling, explanation of diagnosis, planning of further management, and coordination of care.  MEDICATIONS: Scheduled Meds: . atorvastatin  40 mg Oral Daily  . carvedilol  6.25 mg Oral BID WC  . insulin aspart  0-5 Units Subcutaneous QHS  . insulin aspart  0-6 Units Subcutaneous TID WC  . pantoprazole  40 mg Oral BID  . warfarin  7.5 mg Oral ONCE-1600  . Warfarin - Pharmacist Dosing Inpatient   Does not apply q1600   Continuous Infusions: . heparin 600 Units/hr (07/29/20 0709)   PRN Meds:.acetaminophen, ondansetron (ZOFRAN) IV, polyethylene glycol   PHYSICAL EXAM: Vital signs: Vitals:   07/29/20 0413 07/29/20 0500 07/29/20 0734 07/29/20 1133  BP: (!) 93/55  (!) 111/59 (!) 100/57  Pulse: 60  (!) 56 65  Resp: 18  (!) 21 19  Temp: 98.7 F (37.1 C)  98.5 F (36.9 C) 98.6 F (37 C)  TempSrc: Axillary  Oral Oral  SpO2: 98%  100% 100%  Weight:  70 kg    Height:       Filed Weights   07/26/20 0514 07/27/20 0558 07/29/20 0500  Weight: 69.2 kg 69.4 kg 70 kg   Body mass index is 27.34 kg/m.   Gen Exam:Alert awake-not in any distress HEENT:atraumatic, normocephalic Chest: B/L clear to auscultation anteriorly CVS:S1S2 regular Abdomen:soft  non tender, non distended Extremities:no edema Neurology: Non focal Skin: no rash  I have personally reviewed following labs and imaging studies  LABORATORY DATA: CBC: Recent Labs  Lab 07/26/20 0317 07/26/20 1715 07/27/20 0114 07/28/20 0024 07/29/20 0143  WBC 10.1 10.5 9.7 8.9 8.9  HGB 8.6* 9.5* 8.4* 8.2* 8.2*  HCT 26.3* 29.0* 25.7* 25.1* 25.5*  MCV 96.0 95.4 96.3 96.5 96.2  PLT 137* 184 164 180 197    Basic Metabolic Panel: Recent Labs  Lab 07/23/20 0058 07/24/20 0034 07/25/20 0014  NA 141 138 141  K 4.0 4.2 4.2  CL 111 110 111  CO2 27 28 28   GLUCOSE 108* 106* 85  BUN 50* 49* 35*  CREATININE 1.33* 1.35* 1.16*  CALCIUM 8.0* 7.9* 8.1*    GFR: Estimated Creatinine Clearance:  38.7 mL/min (A) (by C-G formula based on SCr of 1.16 mg/dL (H)).  Liver Function Tests: No results for input(s): AST, ALT, ALKPHOS, BILITOT, PROT, ALBUMIN in the last 168 hours. No results for input(s): LIPASE, AMYLASE in the last 168 hours. No results for input(s): AMMONIA in the last 168 hours.  Coagulation Profile: Recent Labs  Lab 07/25/20 0014 07/26/20 0317 07/27/20 0114 07/28/20 0024 07/29/20 0143  INR 1.3* 1.2 1.2 1.4* 1.6*    Cardiac Enzymes: No results for input(s): CKTOTAL, CKMB, CKMBINDEX, TROPONINI in the last 168 hours.  BNP (last 3 results) No results for input(s): PROBNP in the last 8760 hours.  Lipid Profile: No results for input(s): CHOL, HDL, LDLCALC, TRIG, CHOLHDL, LDLDIRECT in the last 72 hours.  Thyroid Function Tests: No results for input(s): TSH, T4TOTAL, FREET4, T3FREE, THYROIDAB in the last 72 hours.  Anemia Panel: No results for input(s): VITAMINB12, FOLATE, FERRITIN, TIBC, IRON, RETICCTPCT in the last 72 hours.  Urine analysis:    Component Value Date/Time   COLORURINE YELLOW 07/20/2020 2355   APPEARANCEUR HAZY (A) 07/20/2020 2355   LABSPEC 1.014 07/20/2020 2355   PHURINE 5.0 07/20/2020 2355   GLUCOSEU NEGATIVE 07/20/2020 2355   HGBUR LARGE  (A) 07/20/2020 2355   BILIRUBINUR NEGATIVE 07/20/2020 2355   KETONESUR 5 (A) 07/20/2020 2355   PROTEINUR NEGATIVE 07/20/2020 2355   UROBILINOGEN 1.0 05/10/2011 1440   NITRITE NEGATIVE 07/20/2020 2355   LEUKOCYTESUR TRACE (A) 07/20/2020 2355    Sepsis Labs: Lactic Acid, Venous    Component Value Date/Time   LATICACIDVEN 1.8 07/21/2020 0107    MICROBIOLOGY: Recent Results (from the past 240 hour(s))  Urine culture     Status: None   Collection Time: 07/20/20  7:05 PM   Specimen: In/Out Cath Urine  Result Value Ref Range Status   Specimen Description IN/OUT CATH URINE  Final   Special Requests NONE  Final   Culture   Final    NO GROWTH Performed at [facility], [address], [place], [place] [address]    Report Status 07/23/2020 FINAL  Final  Blood Culture (routine x 2)     Status: None   Collection Time: 07/20/20  7:33 PM   Specimen: BLOOD  Result Value Ref Range Status   Specimen Description BLOOD SITE NOT SPECIFIED  Final   Special Requests   Final    BOTTLES DRAWN AEROBIC AND ANAEROBIC Blood Culture results may not be optimal due to an inadequate volume of blood received in culture bottles   Culture   Final    NO GROWTH 5 DAYS Performed at [facility], [address], [place], [place] [address]    Report Status 07/25/2020 FINAL  Final  Resp Panel by RT-PCR (Flu A&B, Covid) Nasopharyngeal Swab     Status: None   Collection Time: 07/20/20  7:46 PM   Specimen: Nasopharyngeal Swab; Nasopharyngeal(NP) swabs in vial transport medium  Result Value Ref Range Status   SARS Coronavirus 2 by RT PCR NEGATIVE NEGATIVE Final    Comment: (NOTE) SARS-CoV-2 target nucleic acids are NOT DETECTED.  The SARS-CoV-2 RNA is generally detectable in upper respiratory specimens during the acute phase of infection. The lowest concentration of SARS-CoV-2 viral copies this assay can detect is 138 copies/mL. A negative result does not preclude SARS-Cov-2 infection and should  not be used as the sole basis for treatment or other patient management decisions. A negative result may occur with  improper specimen collection/handling, submission of specimen  other than nasopharyngeal swab, presence of viral mutation(s) within the areas targeted by this assay, and inadequate number of viral copies(<138 copies/mL). A negative result must be combined with clinical observations, patient history, and epidemiological information. The expected result is Negative.  Fact Sheet for Patients:  BloggerCourse.com  Fact Sheet for Healthcare Providers:  SeriousBroker.it  This test is no t yet approved or cleared by the [place] FDA and  has been authorized for detection and/or diagnosis of SARS-CoV-2 by FDA under an Emergency Use Authorization (EUA). This EUA will remain  in effect (meaning this test can be used) for the duration of the COVID-19 declaration under Section 564(b)(1) of the Act, 21 U.S.C.section 360bbb-3(b)(1), unless the authorization is terminated  or revoked sooner.       Influenza A by PCR NEGATIVE NEGATIVE Final   Influenza B by PCR NEGATIVE NEGATIVE Final    Comment: (NOTE) The Xpert Xpress SARS-CoV-2/FLU/RSV plus assay is intended as an aid in the diagnosis of influenza from Nasopharyngeal swab specimens and should not be used as a sole basis for treatment. Nasal washings and aspirates are unacceptable for Xpert Xpress SARS-CoV-2/FLU/RSV testing.  Fact Sheet for Patients: BloggerCourse.com  Fact Sheet for Healthcare Providers: SeriousBroker.it  This test is not yet approved or cleared by the [place] FDA and has been authorized for detection and/or diagnosis of SARS-CoV-2 by FDA under an Emergency Use Authorization (EUA). This EUA will remain in effect (meaning this test can be used) for the duration of the COVID-19 declaration under  Section 564(b)(1) of the Act, 21 U.S.C. section 360bbb-3(b)(1), unless the authorization is terminated or revoked.  Performed at [facility], [address], [place], [place] [address]   Blood Culture (routine x 2)     Status: None   Collection Time: 07/20/20  7:47 PM   Specimen: BLOOD  Result Value Ref Range Status   Specimen Description BLOOD SITE NOT SPECIFIED  Final   Special Requests   Final    BOTTLES DRAWN AEROBIC AND ANAEROBIC Blood Culture results may not be optimal due to an inadequate volume of blood received in culture bottles   Culture   Final    NO GROWTH 5 DAYS Performed at [facility], [address], [place], [place] [address]    Report Status 07/25/2020 FINAL  Final  MRSA PCR Screening     Status: None   Collection Time: 07/24/20  7:14 AM   Specimen: Nasal Mucosa; Nasopharyngeal  Result Value Ref Range Status   MRSA by PCR NEGATIVE NEGATIVE Final    Comment:        The GeneXpert MRSA Assay (FDA approved for NASAL specimens only), is one component of a comprehensive MRSA colonization surveillance program. It is not intended to diagnose MRSA infection nor to guide or monitor treatment for MRSA infections. Performed at [facility], [address], [place], [place] [address]     RADIOLOGY STUDIES/RESULTS: No results found.   LOS: 9 days   Jeoffrey Massed, MD  [facility]    To contact the attending provider between 7A-7P or the covering provider during after hours 7P-7A, please log into the web site [email or web address] and access using universal [place] password for that web site. If you do not have the password, please call the hospital operator.  07/29/2020, 11:42 AM

## 2020-07-29 NOTE — Progress Notes (Signed)
ANTICOAGULATION CONSULT NOTE  Pharmacy Consult for heparin/coumadin Indication: mechanical mitral valve  Labs: Recent Labs    07/27/20 0114 07/28/20 0024 07/29/20 0143  HGB 8.4* 8.2* 8.2*  HCT 25.7* 25.1* 25.5*  PLT 164 180 197  LABPROT 15.3* 16.7* 18.9*  INR 1.2 1.4* 1.6*  HEPARINUNFRC 0.55 0.45 0.47   Assessment: 75 yo F on warfarin PTA (Per med rec: patient takes warfarin 7.5mg  PO every day) to start heparin drip while INR is subtherapeutic. Patient s/p EGD with gastritis and non-bleeding ulcers. Heparin restarted after EGD. Pt had 1 episode of tarry stool and heparin was held 4/16 at  0334. Received request from MD to restart heparin infusion for anticoagulation. Will continue to aim for low 0.3-0.5 with no bolus.    HL is therapeutic this AM. INR trended up slightly to 1.6. Hgb remains in 8s. He is also H.pylori positive. GI plan to use a metronidazole free regimen to avoid drug interaction with coumadin. Pt has gotten 2 doses of coumadin 10mg  reload.   PTA coumadin 7.5mg  PO qday  Goal of Therapy:  Heparin level 0.3-0.5 units/ml INR goal 2.5-3.5 for warfarin therapy Monitor platelets by anticoagulation protocol: Yes   Plan:  Cont heparin gtt 600 units/hr  Daily HL and INR Warfarin 7.5mg  po x1 tonight  , PharmD, [place], AAHIVP, CPP Infectious Disease Pharmacist 07/29/2020 9:39 AM

## 2020-07-29 NOTE — TOC Progression Note (Signed)
Transition of Care [facility]) - Progression Note    Patient Details  Name: Deborah [name] MRN: [id number] Date of Birth: 1945/09/21  [facility]) CM/SW Contact  Huston Foley Jacklynn Ganong, RN Phone Number: 07/29/2020, 10:32 AM  Clinical Narrative:  Case Manager spoke with patient concerning discharge needs. Discussed Home Health Agencies. Patient has no preferences, Referral called to Lorenza Chick, [facility]  [facility] Liaison. Patient states she has RW and that her sister will assist her at discharge.   Expected Discharge Plan: Home w Home Health Services Barriers to Discharge: No Barriers Identified  Expected Discharge Plan and Services Expected Discharge Plan: Home w Home Health Services In-house Referral: NA Discharge Planning Services: CM Consult Post Acute Care Choice: Home Health Living arrangements for the past 2 months: Single Family Home                 DME Arranged: N/A DME Agency: NA       HH Arranged: PT HH Agency: [facility] Date [facility] Agency Contacted: 07/29/20 Time HH Agency Contacted: 1021     Social Determinants of Health (SDOH) Interventions    Readmission Risk Interventions No flowsheet data found.

## 2020-07-29 NOTE — Progress Notes (Signed)
Progress Note  Patient Name: Deborah [name] Date of Encounter: 07/29/2020  [facility] Cardiologist: Arvilla Meres, MD   Subjective   Breathing comfortablly  Eating lunch  No BM today or yesterday  Inpatient Medications    Scheduled Meds: . atorvastatin  40 mg Oral Daily  . carvedilol  6.25 mg Oral BID WC  . insulin aspart  0-5 Units Subcutaneous QHS  . insulin aspart  0-6 Units Subcutaneous TID WC  . pantoprazole  40 mg Oral BID  . Warfarin - Pharmacist Dosing Inpatient   Does not apply q1600   Continuous Infusions: . heparin 600 Units/hr (07/29/20 0709)   PRN Meds: acetaminophen, ondansetron (ZOFRAN) IV, polyethylene glycol   Vital Signs    Vitals:   07/28/20 2353 07/29/20 0413 07/29/20 0500 07/29/20 0734  BP: 103/67 (!) 93/55  (!) 111/59  Pulse: 61 60  (!) 56  Resp: 18 18  (!) 21  Temp: 98.9 F (37.2 C) 98.7 F (37.1 C)  98.5 F (36.9 C)  TempSrc: Axillary Axillary  Oral  SpO2: 99% 98%  100%  Weight:   70 kg   Height:        Intake/Output Summary (Last 24 hours) at 07/29/2020 0741 Last data filed at 07/28/2020 2300 Gross per 24 hour  Intake 182.03 ml  Output 760 ml  Net -577.97 ml   Last 3 Weights 07/29/2020 07/27/2020 07/26/2020  Weight (lbs) 154 lb 5.2 oz 153 lb 152 lb 8.9 oz  Weight (kg) 70 kg 69.4 kg 69.2 kg      Telemetry  NSR- Personally Reviewed  ECG    No new tracings - Personally Reviewed  Physical Exam   GEN: No acute distress.   Neck: No JVD Cardiac: RRR, Crisp valve sounds   Respiratory: Clear to auscultation bilaterally. GI: Soft, nontender, non-distended  MS: No edema; No deformity. Neuro:  Nonfocal    Labs    High Sensitivity Troponin:  No results for input(s): TROPONINIHS in the last 720 hours.    Chemistry Recent Labs  Lab 07/23/20 0058 07/24/20 0034 07/25/20 0014  NA 141 138 141  K 4.0 4.2 4.2  CL 111 110 111  CO2 27 28 28   GLUCOSE 108* 106* 85  BUN 50* 49* 35*  CREATININE 1.33* 1.35* 1.16*  CALCIUM  8.0* 7.9* 8.1*  GFRNONAA 41* 41* 49*  ANIONGAP 3* 0* 2*     Hematology Recent Labs  Lab 07/27/20 0114 07/28/20 0024 07/29/20 0143  WBC 9.7 8.9 8.9  RBC 2.67* 2.60* 2.65*  HGB 8.4* 8.2* 8.2*  HCT 25.7* 25.1* 25.5*  MCV 96.3 96.5 96.2  MCH 31.5 31.5 30.9  MCHC 32.7 32.7 32.2  RDW 15.4 15.2 15.0  PLT 164 180 197    BNPNo results for input(s): BNP, PROBNP in the last 168 hours.   DDimer No results for input(s): DDIMER in the last 168 hours.   Radiology    No results found.  Cardiac Studies   ECHO 07/22/20   1. Left ventricular ejection fraction, by estimation, is 20 to 25%. The  left ventricle has severely decreased function. The left ventricle  demonstrates regional wall motion abnormalities.   All septal segments and the basal inferior segment appear akinetic.  The rest of the LV walls appears severely hypokinetic. The left  ventricular internal cavity size was moderately dilated. Diastolic  function indeterminant due to mechanical mitral  valve.  2. Right ventricular systolic function is normal. The right ventricular  size is normal. There  is moderately elevated pulmonary artery systolic  pressure. The estimated right ventricular systolic pressure is 45.8 mmHg.  3. Left atrial size was severely dilated.  4. The mitral valve has been repaired/replaced. A mechanical [employer]  mitral valve is present and appears well seated with normal function by  doppler interrogation. Mean gradient at HR 86bpm. There is trivial  mitral regurgitation.  5. The aortic valve is tricuspid. There is mild thickening of the aortic  valve. Aortic valve regurgitation is not visualized. Mild aortic valve  sclerosis is present, with no evidence of aortic valve stenosis.  6. The inferior vena cava is normal in size with greater than 50%  respiratory variability, suggesting right atrial pressure of 3 mmHg.   Comparison(s): No significant change from prior study.    Patient Profile     Deborah [name] a 75 y.o.femalewith a hx of NICM w/ EF 25%, STJ singe chamber ICD, STJ MVR 2005 on coumadin, DM, HTN, HLD, nephrolithiasis, S-CHF, mild AS,ABL anemia requiring 3 PRBCs2016 w/ gastric and duodenal ulcers on EGD (INR 4.5),who is being followed by cardiology for the management of anticoagulation  Assessment & Plan    1. Mitral valve disease s/p mechanical MVR in 2005: patient has been on coumadin long term following MVR. Presented with acute on chronic anemia in the setting of GI bleed and supratherapeutic INR.  INR is now 1.6  Keep on heparin and coumadin   Would be good to get her to 2 before d/cing heparin  Goal of course is higher   2.5 to 3.5    - 2. Chronic combined CHF: longstanding NICM with EF 20-25% on echo this admission. Home carvedilol and entresto initially on hold, now back on carvedilol with ongoing soft blood pressures. She appears euvolemic on exam BP is OK on  Will add back low dose Entresto and follow     -   3. Acute on chronic anemia in the setting of GI bleed and supratherapeutic INR: Patient presented with N/V and + occult stool. Required 3u PRBC this admission.  Found to have gastritis and non-bleeding stomach/duodenal ulcers on EGD this admission.  Bx positive for H Pylori Rx PPI, ABX Bismuth   H/H rel stable 8.2    4. HTN: BP is a little better  Add back Entresto    5. HLD: no recent lipids on file - Continue atorvastatin  6. DM type 2: appears to be diet controlled at home. No recent A1C on file - Continue management per primary team        For questions or updates, please contact [facility] Please consult [email or web address] for contact info under        Signed, Dietrich Pates, MD  07/29/2020, 7:41 AM

## 2020-07-30 LAB — CBC
HCT: 25.4 % — ABNORMAL LOW (ref 36.0–46.0)
Hemoglobin: 8.2 g/dL — ABNORMAL LOW (ref 12.0–15.0)
MCH: 30.9 pg (ref 26.0–34.0)
MCHC: 32.3 g/dL (ref 30.0–36.0)
MCV: 95.8 fL (ref 80.0–100.0)
Platelets: 217 10*3/uL (ref 150–400)
RBC: 2.65 MIL/uL — ABNORMAL LOW (ref 3.87–5.11)
RDW: 14.9 % (ref 11.5–15.5)
WBC: 8.4 10*3/uL (ref 4.0–10.5)
nRBC: 0 % (ref 0.0–0.2)

## 2020-07-30 LAB — GLUCOSE, CAPILLARY
Glucose-Capillary: 89 mg/dL (ref 70–99)
Glucose-Capillary: 75 mg/dL (ref 70–99)
Glucose-Capillary: 141 mg/dL — ABNORMAL HIGH (ref 70–99)
Glucose-Capillary: 117 mg/dL — ABNORMAL HIGH (ref 70–99)

## 2020-07-30 LAB — HEPARIN LEVEL (UNFRACTIONATED): Heparin Unfractionated: 0.4 IU/mL (ref 0.30–0.70)

## 2020-07-30 LAB — PROTIME-INR
INR: 1.8 — ABNORMAL HIGH (ref 0.8–1.2)
Prothrombin Time: 21.1 seconds — ABNORMAL HIGH (ref 11.4–15.2)

## 2020-07-30 MED ORDER — WARFARIN SODIUM 5 MG PO TABS
10.0000 mg | ORAL_TABLET | Freq: Once | ORAL | Status: AC
  Administered 2020-07-30: 10 mg via ORAL
  Filled 2020-07-30: qty 2

## 2020-07-30 NOTE — Progress Notes (Signed)
PROGRESS NOTE        PATIENT DETAILS Name: Deborah [name] Age: 75 y.o. Sex: female Date of Birth: 08/01/1945 Admit Date: 07/20/2020 Admitting Physician Angie Fava, DO DEY:[id number], Campbell Lerner, MD  Brief Narrative: Patient is a 75 y.o. female with history of chronic systolic heart failure, mitral valve replacement (mechanical valve)-prior history of GI bleeding due to gastric/duodenal ulcers-presenting with dark-colored FOBT positive stools and a hemoglobin of 6.9-in the setting of supratherapeutic INR.  See below for further details.  Significant events: 4/11>> admit for upper GI bleeding-acute blood loss anemia-supratherapeutic INR. 4/15>> EGD  Significant studies: 4/11>> chest x-ray: Possible airspace disease in the left base 4/13>> Echo: EF 20-25%, old septal segment/basal inferior wall appears akinetic, rest of the LV wall severely hypokinetic.  Antimicrobial therapy: Vancomycin: 4/11 x 1 Flagyl: 4/11 x 1 Cefepime: 4/11 x 1  Microbiology data: 4/11>> blood culture: No growth 4/11>> Covid/influenza PCR: Negative  Procedures : 4/15>> EGD: Nonbleeding gastric ulcer, nonbleeding duodenal ulcer.  Duodenitis  Consults: GI, cardiology  DVT Prophylaxis : SCDs Start: 07/20/20 2158  IV heparin   Subjective: Had brown-colored stools yesterday.  Assessment/Plan: Upper GI bleeding with acute blood loss anemia: Upper GI bleeding has resolved-she did have a few episodes of black stools post EGD-but that was likely old blood making its way out-she finally had a bowel colored stools yesterday.  Hemoglobin remained stable.  Remains on PPI-EGD findings as above.    H. pylori infection: GI recommending Protonix/clarithromycin/amoxicillin-on discharge.  AKI: Hemodynamically mediated-slowly improving-follow periodically.  Thrombocytopenia: Resolved.  Chronic systolic heart failure: Euvolemic-tolerating Coreg-resume Entresto in the next few  days.  History of severe mitral regurgitation-s/p mechanical mitral valve replacement: Currently on IV heparin/overlapping Coumadin-INR slowly coming back up-recommendations from cardiology are to allow INR to track to more than 2 before consideration of discharge.  She had supratherapeutic INR on presentation.  Appreciate cardiology input.    Obesity: Estimated body mass index is 27.61 kg/m as calculated from the following:   Height as of this encounter: 5\' 3"  (1.6 m).   Weight as of this encounter: 70.7 kg.    Diet: Diet Order            Diet heart healthy/carb modified Room service appropriate? No; Fluid consistency: Thin  Diet effective now                  Code Status: Full code   Family Communication: Sister-Norma [phone] voicemail on 4/14, 4/15,4/16, 4/18  Disposition Plan: Status is: Inpatient  Remains inpatient appropriate because:Inpatient level of care appropriate due to severity of illness   Dispo: The patient is from: Home              Anticipated d/c is to: Home              Patient currently is not medically stable to d/c.   Difficult to place patient No    Barriers to Discharge: Overlapping IV heparin/Coumadin-has mechanical mitral valve-plan to discharge once INR is more than 2 as recommended by cardiology.  Antimicrobial agents: Anti-infectives (From admission, onward)   Start     Dose/Rate Route Frequency Ordered Stop   07/21/20 2000  ceFEPIme (MAXIPIME) 2 g in sodium chloride 0.9 % 100 mL IVPB  Status:  Discontinued        2 g 200 mL/hr over 30 Minutes  Intravenous Every 24 hours 07/20/20 2037 07/20/20 2156   07/21/20 2000  ceFEPIme (MAXIPIME) 2 g in sodium chloride 0.9 % 100 mL IVPB  Status:  Discontinued        2 g 200 mL/hr over 30 Minutes Intravenous Every 24 hours 07/21/20 0234 07/21/20 1604   07/21/20 0330  vancomycin (VANCOREADY) IVPB 750 mg/150 mL  Status:  Discontinued        750 mg 150 mL/hr over 60 Minutes Intravenous Every  48 hours 07/21/20 0234 07/21/20 1604   07/20/20 1915  ceFEPIme (MAXIPIME) 2 g in sodium chloride 0.9 % 100 mL IVPB        2 g 200 mL/hr over 30 Minutes Intravenous  Once 07/20/20 1905 07/20/20 2029   07/20/20 1915  metroNIDAZOLE (FLAGYL) IVPB 500 mg        500 mg 100 mL/hr over 60 Minutes Intravenous  Once 07/20/20 1905 07/20/20 2137       Time spent: 25- minutes-Greater than 50% of this time was spent in counseling, explanation of diagnosis, planning of further management, and coordination of care.  MEDICATIONS: Scheduled Meds: . atorvastatin  40 mg Oral Daily  . carvedilol  6.25 mg Oral BID WC  . insulin aspart  0-5 Units Subcutaneous QHS  . insulin aspart  0-6 Units Subcutaneous TID WC  . pantoprazole  40 mg Oral BID  . sacubitril-valsartan  1 tablet Oral BID  . warfarin  10 mg Oral ONCE-1600  . Warfarin - Pharmacist Dosing Inpatient   Does not apply q1600   Continuous Infusions: . heparin 600 Units/hr (07/29/20 0709)   PRN Meds:.acetaminophen, ondansetron (ZOFRAN) IV, polyethylene glycol   PHYSICAL EXAM: Vital signs: Vitals:   07/30/20 0414 07/30/20 0746 07/30/20 0836 07/30/20 1231  BP: 110/68  114/60 (!) 106/59  Pulse: (!) 57  85 60  Resp: 20     Temp: 98.6 F (37 C) 98.8 F (37.1 C)  98.3 F (36.8 C)  TempSrc: Axillary Oral  Oral  SpO2:      Weight:      Height:       Filed Weights   07/27/20 0558 07/29/20 0500 07/30/20 0349  Weight: 69.4 kg 70 kg 70.7 kg   Body mass index is 27.61 kg/m.   Gen Exam:Alert awake-not in any distress HEENT:atraumatic, normocephalic Chest: B/L clear to auscultation anteriorly CVS:S1S2 regular Abdomen:soft non tender, non distended Extremities:no edema Neurology: Non focal Skin: no rash  I have personally reviewed following labs and imaging studies  LABORATORY DATA: CBC: Recent Labs  Lab 07/26/20 1715 07/27/20 0114 07/28/20 0024 07/29/20 0143 07/30/20 0019  WBC 10.5 9.7 8.9 8.9 8.4  HGB 9.5* 8.4* 8.2* 8.2*  8.2*  HCT 29.0* 25.7* 25.1* 25.5* 25.4*  MCV 95.4 96.3 96.5 96.2 95.8  PLT 184 164 180 197 217    Basic Metabolic Panel: Recent Labs  Lab 07/24/20 0034 07/25/20 0014  NA 138 141  K 4.2 4.2  CL 110 111  CO2 28 28  GLUCOSE 106* 85  BUN 49* 35*  CREATININE 1.35* 1.16*  CALCIUM 7.9* 8.1*    GFR: Estimated Creatinine Clearance: 38.9 mL/min (A) (by C-G formula based on SCr of 1.16 mg/dL (H)).  Liver Function Tests: No results for input(s): AST, ALT, ALKPHOS, BILITOT, PROT, ALBUMIN in the last 168 hours. No results for input(s): LIPASE, AMYLASE in the last 168 hours. No results for input(s): AMMONIA in the last 168 hours.  Coagulation Profile: Recent Labs  Lab 07/26/20 0317 07/27/20 0114 07/28/20  0024 07/29/20 0143 07/30/20 0019  INR 1.2 1.2 1.4* 1.6* 1.8*    Cardiac Enzymes: No results for input(s): CKTOTAL, CKMB, CKMBINDEX, TROPONINI in the last 168 hours.  BNP (last 3 results) No results for input(s): PROBNP in the last 8760 hours.  Lipid Profile: No results for input(s): CHOL, HDL, LDLCALC, TRIG, CHOLHDL, LDLDIRECT in the last 72 hours.  Thyroid Function Tests: No results for input(s): TSH, T4TOTAL, FREET4, T3FREE, THYROIDAB in the last 72 hours.  Anemia Panel: No results for input(s): VITAMINB12, FOLATE, FERRITIN, TIBC, IRON, RETICCTPCT in the last 72 hours.  Urine analysis:    Component Value Date/Time   COLORURINE YELLOW 07/20/2020 2355   APPEARANCEUR HAZY (A) 07/20/2020 2355   LABSPEC 1.014 07/20/2020 2355   PHURINE 5.0 07/20/2020 2355   GLUCOSEU NEGATIVE 07/20/2020 2355   HGBUR LARGE (A) 07/20/2020 2355   BILIRUBINUR NEGATIVE 07/20/2020 2355   KETONESUR 5 (A) 07/20/2020 2355   PROTEINUR NEGATIVE 07/20/2020 2355   UROBILINOGEN 1.0 05/10/2011 1440   NITRITE NEGATIVE 07/20/2020 2355   LEUKOCYTESUR TRACE (A) 07/20/2020 2355    Sepsis Labs: Lactic Acid, Venous    Component Value Date/Time   LATICACIDVEN 1.8 07/21/2020 0107     MICROBIOLOGY: Recent Results (from the past 240 hour(s))  Urine culture     Status: None   Collection Time: 07/20/20  7:05 PM   Specimen: In/Out Cath Urine  Result Value Ref Range Status   Specimen Description IN/OUT CATH URINE  Final   Special Requests NONE  Final   Culture   Final    NO GROWTH Performed at [facility], [address] [place], [place] [address]    Report Status 07/23/2020 FINAL  Final  Blood Culture (routine x 2)     Status: None   Collection Time: 07/20/20  7:33 PM   Specimen: BLOOD  Result Value Ref Range Status   Specimen Description BLOOD SITE NOT SPECIFIED  Final   Special Requests   Final    BOTTLES DRAWN AEROBIC AND ANAEROBIC Blood Culture results may not be optimal due to an inadequate volume of blood received in culture bottles   Culture   Final    NO GROWTH 5 DAYS Performed at [facility], [address] [place], [place] [address]    Report Status 07/25/2020 FINAL  Final  Resp Panel by RT-PCR (Flu A&B, Covid) Nasopharyngeal Swab     Status: None   Collection Time: 07/20/20  7:46 PM   Specimen: Nasopharyngeal Swab; Nasopharyngeal(NP) swabs in vial transport medium  Result Value Ref Range Status   SARS Coronavirus 2 by RT PCR NEGATIVE NEGATIVE Final    Comment: (NOTE) SARS-CoV-2 target nucleic acids are NOT DETECTED.  The SARS-CoV-2 RNA is generally detectable in upper respiratory specimens during the acute phase of infection. The lowest concentration of SARS-CoV-2 viral copies this assay can detect is 138 copies/mL. A negative result does not preclude SARS-Cov-2 infection and should not be used as the sole basis for treatment or other patient management decisions. A negative result may occur with  improper specimen collection/handling, submission of specimen other than nasopharyngeal swab, presence of viral mutation(s) within the areas targeted by this assay, and inadequate number of viral copies(<138 copies/mL). A negative  result must be combined with clinical observations, patient history, and epidemiological information. The expected result is Negative.  Fact Sheet for Patients:  BloggerCourse.com  Fact Sheet for Healthcare Providers:  SeriousBroker.it  This test is no t yet approved or  cleared by the [place] and  has been authorized for detection and/or diagnosis of SARS-CoV-2 by FDA under an Emergency Use Authorization (EUA). This EUA will remain  in effect (meaning this test can be used) for the duration of the COVID-19 declaration under Section 564(b)(1) of the Act, 21 U.S.C.section 360bbb-3(b)(1), unless the authorization is terminated  or revoked sooner.       Influenza A by PCR NEGATIVE NEGATIVE Final   Influenza B by PCR NEGATIVE NEGATIVE Final    Comment: (NOTE) The Xpert Xpress SARS-CoV-2/FLU/RSV plus assay is intended as an aid in the diagnosis of influenza from Nasopharyngeal swab specimens and should not be used as a sole basis for treatment. Nasal washings and aspirates are unacceptable for Xpert Xpress SARS-CoV-2/FLU/RSV testing.  Fact Sheet for Patients: BloggerCourse.com  Fact Sheet for Healthcare Providers: SeriousBroker.it  This test is not yet approved or cleared by the [place] FDA and has been authorized for detection and/or diagnosis of SARS-CoV-2 by FDA under an Emergency Use Authorization (EUA). This EUA will remain in effect (meaning this test can be used) for the duration of the COVID-19 declaration under Section 564(b)(1) of the Act, 21 U.S.C. section 360bbb-3(b)(1), unless the authorization is terminated or revoked.  Performed at [facility], [address] [place], [place] [address]   Blood Culture (routine x 2)     Status: None   Collection Time: 07/20/20  7:47 PM   Specimen: BLOOD  Result Value Ref Range Status   Specimen Description  BLOOD SITE NOT SPECIFIED  Final   Special Requests   Final    BOTTLES DRAWN AEROBIC AND ANAEROBIC Blood Culture results may not be optimal due to an inadequate volume of blood received in culture bottles   Culture   Final    NO GROWTH 5 DAYS Performed at [facility], [address] [place], [place] [address]    Report Status 07/25/2020 FINAL  Final  MRSA PCR Screening     Status: None   Collection Time: 07/24/20  7:14 AM   Specimen: Nasal Mucosa; Nasopharyngeal  Result Value Ref Range Status   MRSA by PCR NEGATIVE NEGATIVE Final    Comment:        The GeneXpert MRSA Assay (FDA approved for NASAL specimens only), is one component of a comprehensive MRSA colonization surveillance program. It is not intended to diagnose MRSA infection nor to guide or monitor treatment for MRSA infections. Performed at [facility], [address], [place], [place] [address]     RADIOLOGY STUDIES/RESULTS: No results found.   LOS: 10 days   Jeoffrey Massed, MD  [facility]    To contact the attending provider between 7A-7P or the covering provider during after hours 7P-7A, please log into the web site [email or web address] and access using universal [place] password for that web site. If you do not have the password, please call the hospital operator.  07/30/2020, 2:43 PM

## 2020-07-30 NOTE — Progress Notes (Signed)
ANTICOAGULATION CONSULT NOTE  Pharmacy Consult for heparin/coumadin Indication: mechanical mitral valve  Labs: Recent Labs    07/28/20 0024 07/29/20 0143 07/30/20 0019  HGB 8.2* 8.2* 8.2*  HCT 25.1* 25.5* 25.4*  PLT 180 197 217  LABPROT 16.7* 18.9* 21.1*  INR 1.4* 1.6* 1.8*  HEPARINUNFRC 0.45 0.47 0.40   Assessment: 75 yo F on warfarin PTA (Per med rec: patient takes warfarin 7.5mg  PO every day) to start heparin drip while INR is subtherapeutic. Patient s/p EGD with gastritis and non-bleeding ulcers. Heparin restarted after EGD. Pt had 1 episode of tarry stool and heparin was held 4/16 at  0334. Received request from MD to restart heparin infusion for anticoagulation. Will continue to aim for low 0.3-0.5 with no bolus.    HL is therapeutic this AM. INR trended up slightly to 1.8. Hgb remains in 8s. He is also H.pylori positive. GI plan to use a metronidazole free regimen to avoid drug interaction with coumadin. We will give a slightly higher dose of coumadin today and INR should be higher than 2 tomorrow.   PTA coumadin 7.5mg  PO qday  Goal of Therapy:  Heparin level 0.3-0.5 units/ml INR goal 2.5-3.5 for warfarin therapy Monitor platelets by anticoagulation protocol: Yes   Plan:  Cont heparin gtt 600 units/hr  Daily HL and INR Warfarin 10mg  po x1 tonight  , PharmD, [place], AAHIVP, CPP Infectious Disease Pharmacist 07/30/2020 9:09 AM

## 2020-07-30 NOTE — Progress Notes (Signed)
Progress Note  Patient Name: Deborah [name] Date of Encounter: 07/30/2020  [facility] Cardiologist: Arvilla Meres, MD   Subjective   Pt breathing OK  No CP   Inpatient Medications    Scheduled Meds: . atorvastatin  40 mg Oral Daily  . carvedilol  6.25 mg Oral BID WC  . insulin aspart  0-5 Units Subcutaneous QHS  . insulin aspart  0-6 Units Subcutaneous TID WC  . pantoprazole  40 mg Oral BID  . sacubitril-valsartan  1 tablet Oral BID  . Warfarin - Pharmacist Dosing Inpatient   Does not apply q1600   Continuous Infusions: . heparin 600 Units/hr (07/29/20 0709)   PRN Meds: acetaminophen, ondansetron (ZOFRAN) IV, polyethylene glycol   Vital Signs    Vitals:   07/29/20 2312 07/30/20 0344 07/30/20 0349 07/30/20 0414  BP: 111/60 (!) 90/49  110/68  Pulse: 67 (!) 50  (!) 57  Resp: 20 19  20   Temp: 98.9 F (37.2 C) 98.6 F (37 C)  98.6 F (37 C)  TempSrc: Axillary Axillary  Axillary  SpO2: 98% 98%    Weight:   70.7 kg   Height:        Intake/Output Summary (Last 24 hours) at 07/30/2020 0754 Last data filed at 07/30/2020 0630 Gross per 24 hour  Intake 143.18 ml  Output 1350 ml  Net -1206.82 ml   Last 3 Weights 07/30/2020 07/29/2020 07/27/2020  Weight (lbs) 155 lb 13.8 oz 154 lb 5.2 oz 153 lb  Weight (kg) 70.7 kg 70 kg 69.4 kg      Telemetry  NSR- Personally Reviewed  ECG    No new tracings - Personally Reviewed  Physical Exam   GEN: No acute distress.   Neck: JVP is normal  Cardiac: RRR, Crisp valve sounds   Respiratory: Clear to auscultation bilaterally. GI: Soft, nontender, non-distended  MS: No edema; No deformity. Neuro:  Nonfocal    Labs    High Sensitivity Troponin:  No results for input(s): TROPONINIHS in the last 720 hours.    Chemistry Recent Labs  Lab 07/24/20 0034 07/25/20 0014  NA 138 141  K 4.2 4.2  CL 110 111  CO2 28 28  GLUCOSE 106* 85  BUN 49* 35*  CREATININE 1.35* 1.16*  CALCIUM 7.9* 8.1*  GFRNONAA 41* 49*   ANIONGAP 0* 2*     Hematology Recent Labs  Lab 07/28/20 0024 07/29/20 0143 07/30/20 0019  WBC 8.9 8.9 8.4  RBC 2.60* 2.65* 2.65*  HGB 8.2* 8.2* 8.2*  HCT 25.1* 25.5* 25.4*  MCV 96.5 96.2 95.8  MCH 31.5 30.9 30.9  MCHC 32.7 32.2 32.3  RDW 15.2 15.0 14.9  PLT 180 197 217    BNPNo results for input(s): BNP, PROBNP in the last 168 hours.   DDimer No results for input(s): DDIMER in the last 168 hours.   Radiology    No results found.  Cardiac Studies   ECHO 07/22/20   1. Left ventricular ejection fraction, by estimation, is 20 to 25%. The  left ventricle has severely decreased function. The left ventricle  demonstrates regional wall motion abnormalities.   All septal segments and the basal inferior segment appear akinetic.  The rest of the LV walls appears severely hypokinetic. The left  ventricular internal cavity size was moderately dilated. Diastolic  function indeterminant due to mechanical mitral  valve.  2. Right ventricular systolic function is normal. The right ventricular  size is normal. There is moderately elevated pulmonary artery systolic  pressure. The estimated right ventricular systolic pressure is 45.8 mmHg.  3. Left atrial size was severely dilated.  4. The mitral valve has been repaired/replaced. A mechanical [employer]  mitral valve is present and appears well seated with normal function by  doppler interrogation. Mean gradient at HR 86bpm. There is trivial  mitral regurgitation.  5. The aortic valve is tricuspid. There is mild thickening of the aortic  valve. Aortic valve regurgitation is not visualized. Mild aortic valve  sclerosis is present, with no evidence of aortic valve stenosis.  6. The inferior vena cava is normal in size with greater than 50%  respiratory variability, suggesting right atrial pressure of 3 mmHg.   Comparison(s): No significant change from prior study.   Patient Profile     Deborah Burkel Foustis a 75  y.o.femalewith a hx of NICM w/ EF 25%, STJ singe chamber ICD, STJ MVR 2005 on coumadin, DM, HTN, HLD, nephrolithiasis, S-CHF, mild AS,ABL anemia requiring 3 PRBCs2016 w/ gastric and duodenal ulcers on EGD (INR 4.5),who is being followed by cardiology for the management of anticoagulation  Assessment & Plan    1. Mitral valve disease s/p mechanical MVR in 2005: patient has been on coumadin long term following MVR. Presented with acute on chronic anemia in the setting of GI bleed and supratherapeutic INR.  INR is now 1.3  Keep on heparin and coumadin   Plan for d/c tomorrow   She can then be followed in coumadin clinc on Mon.   - 2. Chronic combined CHF: longstanding NICM with EF 20-25% on echo this admission. Home carvedilol and entresto initially on hold   Seems to be tolerating  Titrate as outpt-   3. Acute on chronic anemia in the setting of GI bleed and supratherapeutic INR: Patient presented with N/V and + occult stool. Required 3u PRBC this admission.  Found to have gastritis and non-bleeding stomach/duodenal ulcers on EGD this admission.  Bx positive for H Pylori   Being treated   H/H stable 8.2    4. HTN: BP   BP is a little better   I would not push meds too much now given anemia and the fact that she has not been dooing much.  Need to follow as she gets more mobile.   5. HLD: no recent lipids on file - Continue atorvastatin       For questions or updates, please contact [facility] Please consult [email or web address] for contact info under        Signed, Dietrich Pates, MD  07/30/2020, 7:54 AM

## 2020-07-30 NOTE — Care Management Important Message (Signed)
Important Message  Patient Details  Name: Deborah [name] MRN: [id number] Date of Birth: [birth year]   Medicare Important Message Given:  Yes - Important Message mailed due to current National Emergency  Verbal consent obtained due to current National Emergency  Relationship to patient: Self Contact Name: Ocean Call Date: 07/30/20  Time: 1433 Phone: [phone] Outcome: Spoke with contact Important Message mailed to: Patient address on file    Orson Aloe 07/30/2020, 2:33 PM

## 2020-07-30 NOTE — Plan of Care (Signed)
  Problem: Education: Goal: Knowledge of General Education information will improve Description: Including pain rating scale, medication(s)/side effects and non-pharmacologic comfort measures Outcome: Progressing   Problem: Health Behavior/Discharge Planning: Goal: Ability to manage health-related needs will improve Outcome: Progressing   Problem: Clinical Measurements: Goal: Ability to maintain clinical measurements within normal limits will improve Outcome: Progressing Goal: Will remain free from infection Outcome: Progressing Goal: Diagnostic test results will improve Outcome: Progressing Goal: Respiratory complications will improve Outcome: Progressing Goal: Cardiovascular complication will be avoided Outcome: Progressing   Problem: Education: Goal: Ability to identify signs and symptoms of gastrointestinal bleeding will improve Outcome: Progressing   Problem: Bowel/Gastric: Goal: Will show no signs and symptoms of gastrointestinal bleeding Outcome: Progressing   Problem: Fluid Volume: Goal: Will show no signs and symptoms of excessive bleeding Outcome: Progressing   Problem: Clinical Measurements: Goal: Complications related to the disease process, condition or treatment will be avoided or minimized Outcome: Progressing   Problem: Education: Goal: Ability to demonstrate management of disease process will improve Outcome: Progressing Goal: Ability to verbalize understanding of medication therapies will improve Outcome: Progressing Goal: Individualized Educational Video(s) Outcome: Progressing   Problem: Activity: Goal: Capacity to carry out activities will improve Outcome: Progressing

## 2020-07-31 ENCOUNTER — Other Ambulatory Visit (HOSPITAL_COMMUNITY): Payer: Self-pay

## 2020-07-31 LAB — CBC
HCT: 25.7 % — ABNORMAL LOW (ref 36.0–46.0)
Hemoglobin: 8.2 g/dL — ABNORMAL LOW (ref 12.0–15.0)
MCH: 30.4 pg (ref 26.0–34.0)
MCHC: 31.9 g/dL (ref 30.0–36.0)
MCV: 95.2 fL (ref 80.0–100.0)
Platelets: 222 10*3/uL (ref 150–400)
RBC: 2.7 MIL/uL — ABNORMAL LOW (ref 3.87–5.11)
RDW: 14.6 % (ref 11.5–15.5)
WBC: 7 10*3/uL (ref 4.0–10.5)
nRBC: 0 % (ref 0.0–0.2)

## 2020-07-31 LAB — FERRITIN: Ferritin: 23 ng/mL (ref 11–307)

## 2020-07-31 LAB — IRON AND TIBC
Iron: 28 ug/dL (ref 28–170)
Saturation Ratios: 7 % — ABNORMAL LOW (ref 10.4–31.8)
TIBC: 395 ug/dL (ref 250–450)
UIBC: 367 ug/dL

## 2020-07-31 LAB — RETICULOCYTES
Immature Retic Fract: 15.8 % (ref 2.3–15.9)
RBC.: 2.93 MIL/uL — ABNORMAL LOW (ref 3.87–5.11)
Retic Count, Absolute: 75 10*3/uL (ref 19.0–186.0)
Retic Ct Pct: 2.6 % (ref 0.4–3.1)

## 2020-07-31 LAB — HEPARIN LEVEL (UNFRACTIONATED): Heparin Unfractionated: <0.1 IU/mL — ABNORMAL LOW (ref 0.30–0.70)

## 2020-07-31 LAB — GLUCOSE, CAPILLARY
Glucose-Capillary: 78 mg/dL (ref 70–99)
Glucose-Capillary: 108 mg/dL — ABNORMAL HIGH (ref 70–99)

## 2020-07-31 LAB — PROTIME-INR
INR: 2 — ABNORMAL HIGH (ref 0.8–1.2)
Prothrombin Time: 22.9 seconds — ABNORMAL HIGH (ref 11.4–15.2)

## 2020-07-31 MED ORDER — CLARITHROMYCIN 500 MG PO TABS
500.0000 mg | ORAL_TABLET | Freq: Two times a day (BID) | ORAL | 0 refills | Status: AC
  Filled 2020-07-31: qty 20, 10d supply, fill #0

## 2020-07-31 MED ORDER — AMOXICILLIN 500 MG PO CAPS
1000.0000 mg | ORAL_CAPSULE | Freq: Two times a day (BID) | ORAL | 0 refills | Status: AC
  Filled 2020-07-31: qty 40, 10d supply, fill #0

## 2020-07-31 MED ORDER — SACUBITRIL-VALSARTAN 24-26 MG PO TABS
1.0000 | ORAL_TABLET | Freq: Two times a day (BID) | ORAL | 0 refills | Status: DC
  Filled 2020-07-31: qty 60, 30d supply, fill #0

## 2020-07-31 MED ORDER — WARFARIN SODIUM 5 MG PO TABS
7.5000 mg | ORAL_TABLET | Freq: Every day | ORAL | 0 refills | Status: DC
  Filled 2020-07-31: qty 30, 20d supply, fill #0

## 2020-07-31 MED ORDER — PANTOPRAZOLE SODIUM 40 MG PO TBEC
40.0000 mg | DELAYED_RELEASE_TABLET | Freq: Two times a day (BID) | ORAL | 0 refills | Status: DC
  Filled 2020-07-31: qty 120, 60d supply, fill #0

## 2020-07-31 MED ORDER — WARFARIN SODIUM 7.5 MG PO TABS: 7.5000 mg | ORAL_TABLET | Freq: Every day | ORAL | Status: DC

## 2020-07-31 MED ORDER — WARFARIN SODIUM 5 MG PO TABS
10.0000 mg | ORAL_TABLET | Freq: Once | ORAL | Status: AC
  Administered 2020-07-31: 10 mg via ORAL
  Filled 2020-07-31: qty 2

## 2020-07-31 MED ORDER — ATORVASTATIN CALCIUM 40 MG PO TABS: 40.0000 mg | ORAL_TABLET | Freq: Every day | ORAL | Status: DC

## 2020-07-31 NOTE — Progress Notes (Signed)
Patient discharged from facility at this time. All discharge paperwork reviewed at bedside. Patient verbalized understanding of all follow up appointments and new medication instructions. Both IV sites removed, catheters intact, pressure dressings applied. Patient denies any further needs at this time. All patient belongings in disposition of the patient.

## 2020-07-31 NOTE — Discharge Summary (Addendum)
PATIENT DETAILS Name: Deborah [name] Age: 75 y.o. Sex: female Date of Birth: Jun 17, 1945 MRN: [id number]. Admitting Physician: Angie Fava, DO IRS:[id number], Campbell Lerner, MD  Admit Date: 07/20/2020 Discharge date: 07/31/2020  Recommendations for Outpatient Follow-up:  1. Follow up with PCP in 1-2 weeks 2. Please obtain CMP/CBC in one week 3. Cardiology will arrange follow up in the Coumadin clinic on Monday for further adjustment of her INR dosage.  Admitted From:  Home   Disposition: Home   Home Health: No  Equipment/Devices: None  Discharge Condition: Stable  CODE STATUS: FULL CODE  Diet recommendation:  Diet Order            Diet - low sodium heart healthy           Diet heart healthy/carb modified Room service appropriate? No; Fluid consistency: Thin  Diet effective now                  Brief Narrative: Patient is a 75 y.o. female with history of chronic systolic heart failure, mitral valve replacement (mechanical valve)-prior history of GI bleeding due to gastric/duodenal ulcers-presenting with dark-colored FOBT positive stools and a hemoglobin of 6.9-in the setting of supratherapeutic INR.  See below for further details.  Significant events: 4/11>> admit for upper GI bleeding-acute blood loss anemia-supratherapeutic INR. 4/15>> EGD  Significant studies: 4/11>> chest x-ray: Possible airspace disease in the left base 4/13>> Echo: EF 20-25%, old septal segment/basal inferior wall appears akinetic, rest of the LV wall severely hypokinetic.  Antimicrobial therapy: Vancomycin: 4/11 x 1 Flagyl: 4/11 x 1 Cefepime: 4/11 x 1  Microbiology data: 4/11>> blood culture: No growth 4/11>> Covid/influenza PCR: Negative  Procedures : 4/15>> EGD: Nonbleeding gastric ulcer, nonbleeding duodenal ulcer.  Duodenitis  Consults: GI, cardiology  Brief Hospital Course: Upper GI bleeding with acute blood loss anemia: Upper GI bleeding has resolved-she did  have a few episodes of black stools post EGD-but that was likely old blood making its way out-she finally had a bowel colored stools yesterday.  Hemoglobin remained stable.  Remains on PPI-EGD findings as above.    H. pylori infection: GI recommending Protonix/clarithromycin/amoxicillin-on discharge.  AKI: Hemodynamically mediated-slowly improving-follow periodically.  Thrombocytopenia: Resolved.  Chronic systolic heart failure: Euvolemic-continue Coreg and Entresto.  History of severe mitral regurgitation-s/p mechanical mitral valve replacement: She had supratherapeutic INR on presentation that slowly came back to normal without the use of vitamin K or FFP.  Once INR was acceptable she underwent EGD-post ED she was placed on IV heparin and overlapping Coumadin.  Per cardiology-once INR got close to 2-okay for discharge-cardiology will arrange for follow-up in the Coumadin clinic on Monday.  Plans are to give her 10 mg of Coumadin on 4/22-and have her resume her usual dosing from 4/23.    Obesity: Estimated body mass index is 27.61 kg/m as calculated from the following:   Height as of this encounter: 5\' 3"  (1.6 m).   Weight as of this encounter: 70.7 kg.    Discharge Diagnoses:  Principal Problem:   Acute upper GI bleed Active Problems:   Mitral valve disorder. (Status post mechanical valve replacement)   SYSTOLIC HEART FAILURE, CHRONIC   Long term (current) use of anticoagulants   Acute on chronic anemia   AKI (acute kidney injury) (HCC)   Lactic acidosis   Warfarin-induced coagulopathy (HCC)   Acute gastric ulcer without hemorrhage or perforation   Duodenal ulcer   Discharge Instructions:  Activity:  As tolerated  Discharge Instructions  Diet - low sodium heart healthy   Complete by: As directed    Discharge instructions   Complete by: As directed    Follow with Primary MD  Georgianne Fick, MD in 1-2 weeks  Follow-up with Coumadin clinic on Monday-4/25 for  further dosing instructions regarding Coumadin.  Please get a complete blood count and chemistry panel checked by your Primary MD at your next visit, and again as instructed by your Primary MD.  Get Medicines reviewed and adjusted: Please take all your medications with you for your next visit with your Primary MD  Laboratory/radiological data: Please request your Primary MD to go over all hospital tests and procedure/radiological results at the follow up, please ask your Primary MD to get all Hospital records sent to his/her office.  In some cases, they will be blood work, cultures and biopsy results pending at the time of your discharge. Please request that your primary care M.D. follows up on these results.  Also Note the following: If you experience worsening of your admission symptoms, develop shortness of breath, life threatening emergency, suicidal or homicidal thoughts you must seek medical attention immediately by calling 911 or calling your MD immediately  if symptoms less severe.  You must read complete instructions/literature along with all the possible adverse reactions/side effects for all the Medicines you take and that have been prescribed to you. Take any new Medicines after you have completely understood and accpet all the possible adverse reactions/side effects.   Do not drive when taking Pain medications or sleeping medications (Benzodaizepines)  Do not take more than prescribed Pain, Sleep and Anxiety Medications. It is not advisable to combine anxiety,sleep and pain medications without talking with your primary care practitioner  Special Instructions: If you have smoked or chewed Tobacco  in the last 2 yrs please stop smoking, stop any regular Alcohol  and or any Recreational drug use.  Wear Seat belts while driving.  Please note: You were cared for by a hospitalist during your hospital stay. Once you are discharged, your primary care physician will handle any further  medical issues. Please note that NO REFILLS for any discharge medications will be authorized once you are discharged, as it is imperative that you return to your primary care physician (or establish a relationship with a primary care physician if you do not have one) for your post hospital discharge needs so that they can reassess your need for medications and monitor your lab values.   Increase activity slowly   Complete by: As directed      Allergies as of 07/31/2020      Reactions   Strawberry Extract Rash   Rash & itching.      Medication List    STOP taking these medications   aspirin EC 81 MG tablet   Entresto 97-103 MG Generic drug: sacubitril-valsartan Replaced by: sacubitril-valsartan 24-26 MG     TAKE these medications   amoxicillin 500 MG tablet Commonly known as: AMOXIL Take 2 tablets (1,000 mg total) by mouth 2 (two) times daily for 10 days.   atorvastatin 40 MG tablet Commonly known as: LIPITOR Take 1 tablet (40 mg total) by mouth daily. Start taking on: Aug 11, 2020 What changed: These instructions start on Aug 11, 2020. If you are unsure what to do until then, ask your doctor or other care provider.   bisacodyl 5 MG EC tablet Generic drug: bisacodyl Take 5 mg by mouth daily as needed for moderate constipation.   carvedilol 25  MG tablet Commonly known as: COREG Take 25 mg by mouth 2 (two) times daily with a meal.   clarithromycin 500 MG tablet Commonly known as: BIAXIN Take 1 tablet (500 mg total) by mouth 2 (two) times daily for 10 days.   HYDROcodone-acetaminophen 7.5-325 MG tablet Commonly known as: NORCO Take 1 tablet by mouth 2 (two) times daily as needed for moderate pain.   ondansetron 4 MG tablet Commonly known as: ZOFRAN Take 4 mg by mouth 3 (three) times daily as needed for nausea.   pantoprazole 40 MG tablet Commonly known as: PROTONIX Take 1 tablet (40 mg total) by mouth 2 (two) times daily.   sacubitril-valsartan 24-26 MG Commonly  known as: ENTRESTO Take 1 tablet by mouth 2 (two) times daily. Replaces: Entresto 97-103 MG   temazepam 15 MG capsule Commonly known as: RESTORIL Take 15 mg by mouth at bedtime as needed for sleep.   tiZANidine 2 MG tablet Commonly known as: ZANAFLEX Take 2 mg by mouth every 8 (eight) hours as needed for muscle spasms.   warfarin 5 MG tablet Commonly known as: COUMADIN Take as directed. If you are unsure how to take this medication, talk to your nurse or doctor. Original instructions: Take 1.5 tablets (7.5 mg total) by mouth daily at 4 PM. Start taking on: August 01, 2020       Follow-up Information    [facility], [facility] Follow up.   Specialty: Home Health Services Why: A representative from [facility] will contact you to arrange start date and time for your therapy. Contact information: [address] [place] [address] [phone]        [facility] Follow up on 08/03/2020.   Why: 10:15 AM - coumadin clinic follow-up Contact information: [address] [place] [address] [phone]       [facility] Follow up on 08/14/2020.   Specialty: Cardiology Why: 11 AM - use garage code # [phone] Contact information: [address] [id number] mc [address] [place] [address] [phone]       Georgianne Fick, MD. Schedule an appointment as soon as possible for a visit in 1 week(s).   Specialty: Internal Medicine Contact information: [address] [place] [address] [place] [address] [phone]        Bensimhon, Bevelyn Buckles, MD .   Specialty: Cardiology Contact information: [address] [place] [address] [phone]        Duke Salvia, MD Follow up in 1 month(s).   Specialty: Cardiology Contact information: [address] [place]  [address] [phone]              Allergies  Allergen Reactions  . Strawberry Extract Rash    Rash & itching.     Other Procedures/Studies: DG Chest Port 1 View  Result Date: 07/20/2020 CLINICAL DATA:  Possible sepsis EXAM: PORTABLE CHEST 1 VIEW COMPARISON:  04/11/2012 FINDINGS: Post sternotomy changes. Fracture through superior sternal wire as before. Left-sided single lead pacing device over the right ventricle. Cardiomegaly. Possible airspace disease at left base with poorly visible diaphragm. The right lung is clear. No pneumothorax. IMPRESSION: 1. Possible airspace disease at the left base with poorly visible diaphragm. 2. Cardiomegaly. Electronically Signed   By: Jasmine Pang M.D.   On: 07/20/2020 19:57   ECHOCARDIOGRAM COMPLETE  Result Date: 07/22/2020    ECHOCARDIOGRAM REPORT   Patient  Name:   Armando Reichert Date of Exam: 07/22/2020 Medical Rec #:  [id number]      Height:       63.0 in Accession #:    [id number]     Weight:       172.0 lb Date of Birth:  Apr 22, 1945       BSA:          1.813 m Patient Age:    74 years       BP:           99/74 mmHg Patient Gender: F              HR:           91 bpm. Exam Location:  Inpatient Procedure: 2D Echo, Cardiac Doppler and Color Doppler Indications:    CHF-Acute Systolic I50.21  History:        Patient has prior history of Echocardiogram examinations, most                 recent 06/18/2018. CHF; Risk Factors:Hypertension, Dyslipidemia                 and Diabetes.  Sonographer:    Eulah Pont RDCS Referring Phys: [id number] RHONDA G BARRETT IMPRESSIONS  1. Left ventricular ejection fraction, by estimation, is 20 to 25%. The left ventricle has severely decreased function. The left ventricle demonstrates regional wall motion abnormalities.         All septal segments and the basal inferior segment appear akinetic. The rest of the LV walls appears severely hypokinetic. The left ventricular internal cavity size was moderately dilated. Diastolic function  indeterminant due to mechanical mitral valve.  2. Right ventricular systolic function is normal. The right ventricular size is normal. There is moderately elevated pulmonary artery systolic pressure. The estimated right ventricular systolic pressure is 45.8 mmHg.  3. Left atrial size was severely dilated.  4. The mitral valve has been repaired/replaced. A mechanical St. Jude mitral valve is present and appears well seated with normal function by doppler interrogation. Mean gradient at HR 86bpm. There is trivial mitral regurgitation.  5. The aortic valve is tricuspid. There is mild thickening of the aortic valve. Aortic valve regurgitation is not visualized. Mild aortic valve sclerosis is present, with no evidence of aortic valve stenosis.  6. The inferior vena cava is normal in size with greater than 50% respiratory variability, suggesting right atrial pressure of 3 mmHg. Comparison(s): No significant change from prior study. FINDINGS  Left Ventricle: Left ventricular ejection fraction, by estimation, is 20 to 25%. The left ventricle has severely decreased function. The left ventricle demonstrates regional wall motion abnormalities. All septal segments and the basal inferior segment appear akinetic. The rest of the LV walls appears severely hypokinetic. The left ventricular internal cavity size was moderately dilated. There is no left ventricular hypertrophy. Diastolic function indeterminant due to mechanical mitral valve. Right Ventricle: The right ventricular size is normal. No increase in right ventricular wall thickness. Right ventricular systolic function is normal. There is moderately elevated pulmonary artery systolic pressure. The tricuspid regurgitant velocity is 3.27 m/s, and with an assumed right atrial pressure of 3 mmHg, the estimated right ventricular systolic pressure is 45.8 mmHg. Left Atrium: Left atrial size was severely dilated. Right Atrium: Right atrial size was normal in size.  Pericardium: Trivial pericardial effusion is present. Mitral Valve: A mechanical St. Jude mitral valve is present and appears well seated with normal function by doppler interrogation. Mean  gradient at HR 86bpm. There is trivial mitral regurgitation. The mitral valve has been repaired/replaced. Trivial mitral valve regurgitation. There is a St. Jude mechanical valve present in the mitral position. MV peak gradient, 6.6 mmHg. The mean mitral valve gradient is 5.0 mmHg. Tricuspid Valve: The tricuspid valve is normal in structure. Tricuspid valve regurgitation is mild. Aortic Valve: The aortic valve is tricuspid. There is mild thickening of the aortic valve. Aortic valve regurgitation is not visualized. Mild aortic valve sclerosis is present, with no evidence of aortic valve stenosis. Pulmonic Valve: The pulmonic valve was normal in structure. Pulmonic valve regurgitation is not visualized. Aorta: The aortic root and ascending aorta are structurally normal, with no evidence of dilitation. Venous: The inferior vena cava is normal in size with greater than 50% respiratory variability, suggesting right atrial pressure of 3 mmHg. IAS/Shunts: No atrial level shunt detected by color flow Doppler. Additional Comments: A device lead is visualized.  LEFT VENTRICLE PLAX 2D LVIDd:         5.30 cm LVIDs:         4.60 cm LV PW:         1.00 cm LV IVS:        0.80 cm LVOT diam:     1.70 cm LV SV:         36 LV SV Index:   20 LVOT Area:     2.27 cm  LV Volumes (MOD) LV vol d, MOD A2C: 111.0 ml LV vol d, MOD A4C: 92.4 ml LV vol s, MOD A2C: 60.1 ml LV vol s, MOD A4C: 62.1 ml LV SV MOD A2C:     50.9 ml LV SV MOD A4C:     92.4 ml LV SV MOD BP:      42.4 ml RIGHT VENTRICLE RV S prime:     9.73 cm/s TAPSE (M-mode): 2.0 cm LEFT ATRIUM              Index       RIGHT ATRIUM           Index LA diam:        2.80 cm  1.54 cm/m  RA Area:     12.50 cm LA Vol (A2C):   95.5 ml  52.66 ml/m RA Volume:   23.70 ml  13.07 ml/m LA Vol (A4C):    111.0 ml 61.21 ml/m LA Biplane Vol: 103.0 ml 56.80 ml/m  AORTIC VALVE LVOT Vmax:   109.12 cm/s LVOT Vmean:  66.625 cm/s LVOT VTI:    0.158 m  AORTA Ao Root diam: 2.70 cm Ao Asc diam:  2.90 cm MITRAL VALVE                TRICUSPID VALVE MV Area (PHT): 2.11 cm     TR Peak grad:   42.8 mmHg MV Area VTI:   1.26 cm     TR Vmax:        327.00 cm/s MV Peak grad:  6.6 mmHg MV Mean grad:  5.0 mmHg     SHUNTS MV Vmax:       1.28 m/s     Systemic VTI:  0.16 m MV Vmean:      111.0 cm/s   Systemic Diam: 1.70 cm MV Decel Time: 359 msec MR Peak grad: 110.7 mmHg MR Mean grad: 67.0 mmHg MR Vmax:      526.00 cm/s MR Vmean:     387.0 cm/s MV E velocity: 106.00 cm/s MV A velocity: 105.00  cm/s MV E/A ratio:  1.01 Laurance Flatten MD Electronically signed by Laurance Flatten MD Signature Date/Time: 07/22/2020/12:42:50 PM    Final      TODAY-DAY OF DISCHARGE:  Subjective:   [place] today has no headache,no chest abdominal pain,no new weakness tingling or numbness, feels much better wants to go home today.   Objective:   Blood pressure 128/63, pulse 74, temperature 98.2 F (36.8 C), temperature source Oral, resp. rate 15, height  (1.6 m), weight 70.7 kg, SpO2 99 %.  Intake/Output Summary (Last 24 hours) at 07/31/2020 1121 Last data filed at 07/31/2020 0923 Gross per 24 hour  Intake 540 ml  Output 2 ml  Net 538 ml   Filed Weights   07/27/20 0558 07/29/20 0500 07/30/20 0349  Weight: 69.4 kg 70 kg 70.7 kg    Exam: Awake Alert, Oriented *3, No new F.N deficits, Normal affect Nash.AT,PERRAL Supple Neck,No JVD, No cervical lymphadenopathy appriciated.  Symmetrical Chest wall movement, Good air movement bilaterally, CTAB RRR,No Gallops,Rubs or new Murmurs, No Parasternal Heave +ve B.Sounds, Abd Soft, Non tender, No organomegaly appriciated, No rebound -guarding or rigidity. No Cyanosis, Clubbing or edema, No new Rash or bruise   PERTINENT RADIOLOGIC STUDIES: No results found.   PERTINENT LAB  RESULTS: CBC: Recent Labs    07/30/20 0019 07/31/20 0338  WBC 8.4 7.0  HGB 8.2* 8.2*  HCT 25.4* 25.7*  PLT 217 222   CMET CMP     Component Value Date/Time   NA 141 07/25/2020 0014   NA 142 02/01/2019 1334   K 4.2 07/25/2020 0014   CL 111 07/25/2020 0014   CO2 28 07/25/2020 0014   GLUCOSE 85 07/25/2020 0014   BUN 35 (H) 07/25/2020 0014   BUN 20 02/01/2019 1334   CREATININE 1.16 (H) 07/25/2020 0014   CREATININE 1.14 (H) 10/22/2015 1252   CALCIUM 8.1 (L) 07/25/2020 0014   PROT 5.0 (L) 07/21/2020 0929   ALBUMIN 3.0 (L) 07/21/2020 0929   AST 16 07/21/2020 0929   ALT 13 07/21/2020 0929   ALKPHOS 35 (L) 07/21/2020 0929   BILITOT 0.7 07/21/2020 0929   GFRNONAA 49 (L) 07/25/2020 0014   GFRAA 53 (L) 02/01/2019 1334    GFR Estimated Creatinine Clearance: 38.9 mL/min (A) (by C-G formula based on SCr of 1.16 mg/dL (H)). No results for input(s): LIPASE, AMYLASE in the last 72 hours. No results for input(s): CKTOTAL, CKMB, CKMBINDEX, TROPONINI in the last 72 hours. Invalid input(s): POCBNP No results for input(s): DDIMER in the last 72 hours. No results for input(s): HGBA1C in the last 72 hours. No results for input(s): CHOL, HDL, LDLCALC, TRIG, CHOLHDL, LDLDIRECT in the last 72 hours. No results for input(s): TSH, T4TOTAL, T3FREE, THYROIDAB in the last 72 hours.  Invalid input(s): FREET3 Recent Labs    07/31/20 0736  FERRITIN 23  TIBC 395  IRON 28  RETICCTPCT 2.6   Coags: Recent Labs    07/30/20 0019 07/31/20 0338  INR 1.8* 2.0*   Microbiology: Recent Results (from the past 240 hour(s))  MRSA PCR Screening     Status: None   Collection Time: 07/24/20  7:14 AM   Specimen: Nasal Mucosa; Nasopharyngeal  Result Value Ref Range Status   MRSA by PCR NEGATIVE NEGATIVE Final    Comment:        The GeneXpert MRSA Assay (FDA approved for NASAL specimens only), is one component of a comprehensive MRSA colonization surveillance program. It is not intended to diagnose  MRSA infection  nor to guide or monitor treatment for MRSA infections. Performed at [facility], [address] [place], [place] [address]     FURTHER DISCHARGE INSTRUCTIONS:  Get Medicines reviewed and adjusted: Please take all your medications with you for your next visit with your Primary MD  Laboratory/radiological data: Please request your Primary MD to go over all hospital tests and procedure/radiological results at the follow up, please ask your Primary MD to get all Hospital records sent to his/her office.  In some cases, they will be blood work, cultures and biopsy results pending at the time of your discharge. Please request that your primary care M.D. goes through all the records of your hospital data and follows up on these results.  Also Note the following: If you experience worsening of your admission symptoms, develop shortness of breath, life threatening emergency, suicidal or homicidal thoughts you must seek medical attention immediately by calling 911 or calling your MD immediately  if symptoms less severe.  You must read complete instructions/literature along with all the possible adverse reactions/side effects for all the Medicines you take and that have been prescribed to you. Take any new Medicines after you have completely understood and accpet all the possible adverse reactions/side effects.   Do not drive when taking Pain medications or sleeping medications (Benzodaizepines)  Do not take more than prescribed Pain, Sleep and Anxiety Medications. It is not advisable to combine anxiety,sleep and pain medications without talking with your primary care practitioner  Special Instructions: If you have smoked or chewed Tobacco  in the last 2 yrs please stop smoking, stop any regular Alcohol  and or any Recreational drug use.  Wear Seat belts while driving.  Please note: You were cared for by a hospitalist during your hospital stay. Once you are discharged, your  primary care physician will handle any further medical issues. Please note that NO REFILLS for any discharge medications will be authorized once you are discharged, as it is imperative that you return to your primary care physician (or establish a relationship with a primary care physician if you do not have one) for your post hospital discharge needs so that they can reassess your need for medications and monitor your lab values.  Total Time spent coordinating discharge including counseling, education and face to face time equals 35 minutes.  SignedJeoffrey Massed 07/31/2020 11:21 AM

## 2020-07-31 NOTE — Progress Notes (Signed)
ANTICOAGULATION CONSULT NOTE  Pharmacy Consult for heparin/coumadin Indication: mechanical mitral valve  Labs: Recent Labs    07/29/20 0143 07/30/20 0019 07/31/20 0338  HGB 8.2* 8.2* 8.2*  HCT 25.5* 25.4* 25.7*  PLT 197 217 222  LABPROT 18.9* 21.1* 22.9*  INR 1.6* 1.8* 2.0*  HEPARINUNFRC 0.47 0.40 <0.10*   Assessment: 75 yo F on warfarin PTA (Per med rec: patient takes warfarin 7.5mg  PO every day) to start heparin drip while INR is subtherapeutic. Patient s/p EGD with gastritis and non-bleeding ulcers. Heparin restarted after EGD. Pt had 1 episode of tarry stool and heparin was held 4/16 at  0334. Received request from MD to restart heparin infusion for anticoagulation. Will continue to aim for low 0.3-0.5 with no bolus.    HL is therapeutic this AM. INR trended up slightly to 2. Hgb remains in 8s. He is also H.pylori positive. GI plan to use a metronidazole free regimen to avoid drug interaction with coumadin. We will give one more 10mg  dose then resume the home dose. Ok to stop heparin bridge per Dr. . Possible dc today and INR check on Monday.  PTA coumadin 7.5mg  PO qday  Goal of Therapy:  Heparin level 0.3-0.5 units/ml INR goal 2.5-3.5 for warfarin therapy Monitor platelets by anticoagulation protocol: Yes   Plan:  Dc heparin  Daily INR Warfarin 10mg  po x1 the resume 7.5mg  PO qday  Sunday, PharmD, [place], AAHIVP, CPP Infectious Disease Pharmacist 07/31/2020 8:40 AM

## 2020-07-31 NOTE — Progress Notes (Signed)
PT Cancellation Note  Patient Details Name: Deborah [name] MRN: [id number] DOB: [birth year]   Cancelled Treatment:    Reason Eval/Treat Not Completed: Other (comment).  DC from hospital was done.   Ivar Drape 07/31/2020, 1:46 PM Samul Dada, PT MS Acute Rehab Dept. Number: [facility] [id number] and [facility] [phone]

## 2020-07-31 NOTE — Progress Notes (Signed)
Progress Note  Patient Name: GERDA YIN Date of Encounter: 07/31/2020  [facility] Cardiologist: Arvilla Meres, MD   Subjective   Pt denies CP  Breahting is OK   Eager to go home  Inpatient Medications    Scheduled Meds: . atorvastatin  40 mg Oral Daily  . carvedilol  6.25 mg Oral BID WC  . insulin aspart  0-5 Units Subcutaneous QHS  . insulin aspart  0-6 Units Subcutaneous TID WC  . pantoprazole  40 mg Oral BID  . sacubitril-valsartan  1 tablet Oral BID  . Warfarin - Pharmacist Dosing Inpatient   Does not apply q1600   Continuous Infusions: . heparin 600 Units/hr (07/31/20 0352)   PRN Meds: acetaminophen, ondansetron (ZOFRAN) IV, polyethylene glycol   Vital Signs    Vitals:   07/30/20 1725 07/30/20 1957 07/30/20 2329 07/31/20 0350  BP: (!) 107/58 109/63 (!) 100/56 107/74  Pulse:  (!) 58 (!) 57 65  Resp: 20 20 18 15   Temp: 99.2 F (37.3 C) 98.2 F (36.8 C) 98 F (36.7 C) 98.2 F (36.8 C)  TempSrc: Oral Oral Oral Oral  SpO2:  100% 99% 99%  Weight:      Height:        Intake/Output Summary (Last 24 hours) at 07/31/2020 08/02/2020 Last data filed at 07/30/2020 1958 Gross per 24 hour  Intake 300 ml  Output 502 ml  Net -202 ml   Last 3 Weights 07/30/2020 07/29/2020 07/27/2020  Weight (lbs) 155 lb 13.8 oz 154 lb 5.2 oz 153 lb  Weight (kg) 70.7 kg 70 kg 69.4 kg      Telemetry  NSR- Personally Reviewed  ECG    No new tracings - Personally Reviewed  Physical Exam   GEN: No acute distress.   Neck: JVP is not elevated   Cardiac: RRR, Crisp valve sounds   Respiratory: Clear to auscultation bilaterally. GI: Soft, nontender, non-distended  MS: No edema; No deformity. Neuro:  Nonfocal    Labs    High Sensitivity Troponin:  No results for input(s): TROPONINIHS in the last 720 hours.    Chemistry Recent Labs  Lab 07/25/20 0014  NA 141  K 4.2  CL 111  CO2 28  GLUCOSE 85  BUN 35*  CREATININE 1.16*  CALCIUM 8.1*  GFRNONAA 49*  ANIONGAP 2*      Hematology Recent Labs  Lab 07/29/20 0143 07/30/20 0019 07/31/20 0338  WBC 8.9 8.4 7.0  RBC 2.65* 2.65* 2.70*  HGB 8.2* 8.2* 8.2*  HCT 25.5* 25.4* 25.7*  MCV 96.2 95.8 95.2  MCH 30.9 30.9 30.4  MCHC 32.2 32.3 31.9  RDW 15.0 14.9 14.6  PLT 197 217 222    BNPNo results for input(s): BNP, PROBNP in the last 168 hours.   DDimer No results for input(s): DDIMER in the last 168 hours.   Radiology    No results found.  Cardiac Studies   ECHO 07/22/20   1. Left ventricular ejection fraction, by estimation, is 20 to 25%. The  left ventricle has severely decreased function. The left ventricle  demonstrates regional wall motion abnormalities.   All septal segments and the basal inferior segment appear akinetic.  The rest of the LV walls appears severely hypokinetic. The left  ventricular internal cavity size was moderately dilated. Diastolic  function indeterminant due to mechanical mitral  valve.  2. Right ventricular systolic function is normal. The right ventricular  size is normal. There is moderately elevated pulmonary artery systolic  pressure.  The estimated right ventricular systolic pressure is 45.8 mmHg.  3. Left atrial size was severely dilated.  4. The mitral valve has been repaired/replaced. A mechanical St. Jude  mitral valve is present and appears well seated with normal function by  doppler interrogation. Mean gradient at HR 86bpm. There is trivial  mitral regurgitation.  5. The aortic valve is tricuspid. There is mild thickening of the aortic  valve. Aortic valve regurgitation is not visualized. Mild aortic valve  sclerosis is present, with no evidence of aortic valve stenosis.  6. The inferior vena cava is normal in size with greater than 50%  respiratory variability, suggesting right atrial pressure of 3 mmHg.   Comparison(s): No significant change from prior study.   Patient Profile     Tesslyn Baumert Foustis a 75 y.o.femalewith a  hx of NICM w/ EF 25%, STJ singe chamber ICD, STJ MVR 2005 on coumadin, DM, HTN, HLD, nephrolithiasis, S-CHF, mild AS,ABL anemia requiring 3 PRBCs2016 w/ gastric and duodenal ulcers on EGD (INR 4.5),who is being followed by cardiology for the management of anticoagulation  Assessment & Plan    1. Mitral valve disease s/p mechanical MVR in 2005: patient has been on coumadin long term following MVR. Presented with acute on chronic anemia in the setting of GI bleed and supratherapeutic INR.  INR is now 2    Follow up on Mon in coumadin clinic to check INR WIll review with pharmacy dosing    2. Chronic combined CHF: longstanding NICM with EF 20-25% on echo this admission.  Coreg and entresto have been restarted at lower doses.  BP marginal at times  I would keep on this dose for now  Meds can be titrated as outpt   Pt has significant deconditioning and is anemic   Do not want orthostasis/fall  3. Acute on chronic anemia in the setting of GI bleed and supratherapeutic INR: Patient presented with N/V and + occult stool. Required 3u PRBC this admission.  Found to have gastritis and non-bleeding stomach/duodenal ulcers on EGD this admission.  Bx positive for H Pylori   Being treated   H/H stable 8.2   4  Anemia  H/H have remained at 8.2 for a few days   Will check Fe studies   4. HTN: BP 90s to 110    Follow as outpt  And titrate CHF meds as tolerates    5. HLD: no recent lipids on file - Continue atorvastatin  OK to d/c home   Again will arrange appts with coumadin clinc and with CHF clnic ([facility])     For questions or updates, please contact [facility] Please consult [email or web address] for contact info under        Signed, Dietrich Pates, MD  07/31/2020, 6:21 AM

## 2020-07-31 NOTE — Plan of Care (Signed)
  Problem: Education: Goal: Knowledge of General Education information will improve Description: Including pain rating scale, medication(s)/side effects and non-pharmacologic comfort measures Outcome: Adequate for Discharge   Problem: Health Behavior/Discharge Planning: Goal: Ability to manage health-related needs will improve Outcome: Adequate for Discharge   Problem: Clinical Measurements: Goal: Ability to maintain clinical measurements within normal limits will improve Outcome: Adequate for Discharge Goal: Will remain free from infection Outcome: Adequate for Discharge Goal: Diagnostic test results will improve Outcome: Adequate for Discharge Goal: Respiratory complications will improve Outcome: Adequate for Discharge Goal: Cardiovascular complication will be avoided Outcome: Adequate for Discharge   Problem: Education: Goal: Ability to identify signs and symptoms of gastrointestinal bleeding will improve Outcome: Adequate for Discharge   Problem: Bowel/Gastric: Goal: Will show no signs and symptoms of gastrointestinal bleeding Outcome: Adequate for Discharge   Problem: Fluid Volume: Goal: Will show no signs and symptoms of excessive bleeding Outcome: Adequate for Discharge   Problem: Clinical Measurements: Goal: Complications related to the disease process, condition or treatment will be avoided or minimized Outcome: Adequate for Discharge   Problem: Education: Goal: Ability to demonstrate management of disease process will improve Outcome: Adequate for Discharge Goal: Ability to verbalize understanding of medication therapies will improve Outcome: Adequate for Discharge Goal: Individualized Educational Video(s) Outcome: Adequate for Discharge   Problem: Activity: Goal: Capacity to carry out activities will improve Outcome: Adequate for Discharge   Problem: Cardiac: Goal: Ability to achieve and maintain adequate cardiopulmonary perfusion will improve Outcome:  Adequate for Discharge

## 2020-07-31 NOTE — TOC Transition Note (Signed)
Transition of Care [facility]) - CM/SW Discharge Note   Patient Details  Name: Deborah [name] MRN: [id number] Date of Birth: 06-27-1945  Transition of Care [facility]) CM/SW Contact:  Lockie Pares, RN Phone Number: 07/31/2020, 9:35 AM   Clinical Narrative:     Patient will be DC today with a INR check on Monday. Patient has been on coumadin and understands lab checks. Set up with [facility] PT.   No further needs verbalized.  Final next level of care: Home w Home Health Services Barriers to Discharge: No Barriers Identified   Patient Goals and CMS Choice Patient states their goals for this hospitalization and ongoing recovery are:: get better   Choice offered to / list presented to : Patient  Discharge Placement                       Discharge Plan and Services In-house Referral: NA Discharge Planning Services: CM Consult Post Acute Care Choice: Home Health          DME Arranged: N/A DME Agency: NA       HH Arranged: PT HH Agency: [facility] Date [facility] Agency Contacted: 07/29/20 Time HH Agency Contacted: 1021    Social Determinants of Health (SDOH) Interventions     Readmission Risk Interventions No flowsheet data found.

## 2020-07-31 NOTE — Discharge Instructions (Signed)
Information on my medicine - Coumadin   (Warfarin)  This medication education was reviewed with me or my healthcare representative as part of my discharge preparation.  The pharmacist that spoke with me during my hospital stay was:  Ulyses Southward, RPH-CPP  Why was Coumadin prescribed for you? Coumadin was prescribed for you because you have a blood clot or a medical condition that can cause an increased risk of forming blood clots. Blood clots can cause serious health problems by blocking the flow of blood to the heart, lung, or brain. Coumadin can prevent harmful blood clots from forming. As a reminder your indication for Coumadin is:   Blood Clot Prevention After Heart Valve Surgery  What test will check on my response to Coumadin? While on Coumadin (warfarin) you will need to have an INR test regularly to ensure that your dose is keeping you in the desired range. The INR (international normalized ratio) number is calculated from the result of the laboratory test called prothrombin time (PT).  If an INR APPOINTMENT HAS NOT ALREADY BEEN MADE FOR YOU please schedule an appointment to have this lab work done by your health care provider within 7 days. Your INR goal is usually a number between:  2.5-3.5.  Ask your health care provider during an office visit what your goal INR is.  What  do you need to  know  About  COUMADIN? Take Coumadin (warfarin) exactly as prescribed by your healthcare provider about the same time each day.  DO NOT stop taking without talking to the doctor who prescribed the medication.  Stopping without other blood clot prevention medication to take the place of Coumadin may increase your risk of developing a new clot or stroke.  Get refills before you run out.  What do you do if you miss a dose? If you miss a dose, take it as soon as you remember on the same day then continue your regularly scheduled regimen the next day.  Do not take two doses of Coumadin at the same  time.  Important Safety Information A possible side effect of Coumadin (Warfarin) is an increased risk of bleeding. You should call your healthcare provider right away if you experience any of the following: ? Bleeding from an injury or your nose that does not stop. ? Unusual colored urine (red or dark brown) or unusual colored stools (red or black). ? Unusual bruising for unknown reasons. ? A serious fall or if you hit your head (even if there is no bleeding).  Some foods or medicines interact with Coumadin (warfarin) and might alter your response to warfarin. To help avoid this: ? Eat a balanced diet, maintaining a consistent amount of Vitamin K. ? Notify your provider about major diet changes you plan to make. ? Avoid alcohol or limit your intake to 1 drink for women and 2 drinks for men per day. (1 drink is 5 oz. wine, 12 oz. beer, or 1.5 oz. liquor.)  Make sure that ANY health care provider who prescribes medication for you knows that you are taking Coumadin (warfarin).  Also make sure the healthcare provider who is monitoring your Coumadin knows when you have started a new medication including herbals and non-prescription products.  Coumadin (Warfarin)  Major Drug Interactions  Increased Warfarin Effect Decreased Warfarin Effect  Alcohol (large quantities) Antibiotics (esp. Septra/Bactrim, Flagyl, Cipro) Amiodarone (Cordarone) Aspirin (ASA) Cimetidine (Tagamet) Megestrol (Megace) NSAIDs (ibuprofen, naproxen, etc.) Piroxicam (Feldene) Propafenone (Rythmol SR) Propranolol (Inderal) Isoniazid (INH) Posaconazole (Noxafil) Barbiturates (  Phenobarbital) Carbamazepine (Tegretol) Chlordiazepoxide (Librium) Cholestyramine (Questran) Griseofulvin Oral Contraceptives Rifampin Sucralfate (Carafate) Vitamin K   Coumadin (Warfarin) Major Herbal Interactions  Increased Warfarin Effect Decreased Warfarin Effect  Garlic Ginseng Ginkgo biloba Coenzyme Q10 Green tea St. John's wort     Coumadin (Warfarin) FOOD Interactions  Eat a consistent number of servings per week of foods HIGH in Vitamin K (1 serving =  cup)  Collards (cooked, or boiled & drained) Kale (cooked, or boiled & drained) Mustard greens (cooked, or boiled & drained) Parsley *serving size only =  cup Spinach (cooked, or boiled & drained) Swiss chard (cooked, or boiled & drained) Turnip greens (cooked, or boiled & drained)  Eat a consistent number of servings per week of foods MEDIUM-HIGH in Vitamin K (1 serving = 1 cup)  Asparagus (cooked, or boiled & drained) Broccoli (cooked, boiled & drained, or raw & chopped) Brussel sprouts (cooked, or boiled & drained) *serving size only =  cup Lettuce, raw (green leaf, endive, romaine) Spinach, raw Turnip greens, raw & chopped   These websites have more information on Coumadin (warfarin):  http://www.king-russell.com/; https://www.hines.net/;

## 2020-08-03 ENCOUNTER — Ambulatory Visit (INDEPENDENT_AMBULATORY_CARE_PROVIDER_SITE_OTHER): Payer: Medicare HMO

## 2020-08-03 DIAGNOSIS — I428 Other cardiomyopathies: Secondary | ICD-10-CM

## 2020-08-03 LAB — CUP PACEART REMOTE DEVICE CHECK
Battery Remaining Longevity: 60 mo
Battery Remaining Percentage: 66 %
Battery Voltage: 2.98 V
Brady Statistic RV Percent Paced: 1 %
Date Time Interrogation Session: 20220422212621
HighPow Impedance: 35 Ohm
HighPow Impedance: 36 Ohm
Implantable Lead Implant Date: 20090410
Implantable Lead Location: 753860
Implantable Lead Model: 7120
Implantable Pulse Generator Implant Date: 20180423
Lead Channel Impedance Value: 390 Ohm
Lead Channel Pacing Threshold Amplitude: 0.5 V
Lead Channel Pacing Threshold Pulse Width: 0.5 ms
Lead Channel Sensing Intrinsic Amplitude: 11.7 mV
Lead Channel Setting Pacing Amplitude: 2.5 V
Lead Channel Setting Pacing Pulse Width: 0.5 ms
Lead Channel Setting Sensing Sensitivity: 0.5 mV
Pulse Gen Serial Number: 7423042

## 2020-08-04 ENCOUNTER — Other Ambulatory Visit (HOSPITAL_COMMUNITY): Payer: Self-pay

## 2020-08-07 ENCOUNTER — Other Ambulatory Visit: Payer: Self-pay

## 2020-08-07 ENCOUNTER — Ambulatory Visit (INDEPENDENT_AMBULATORY_CARE_PROVIDER_SITE_OTHER): Payer: Medicare HMO | Admitting: *Deleted

## 2020-08-07 DIAGNOSIS — Z5181 Encounter for therapeutic drug level monitoring: Secondary | ICD-10-CM | POA: Diagnosis not present

## 2020-08-07 DIAGNOSIS — Z952 Presence of prosthetic heart valve: Secondary | ICD-10-CM

## 2020-08-07 DIAGNOSIS — I059 Rheumatic mitral valve disease, unspecified: Secondary | ICD-10-CM

## 2020-08-07 LAB — PROTIME-INR
INR: 7.7 (ref 0.9–1.2)
Prothrombin Time: 75.7 s — ABNORMAL HIGH (ref 9.1–12.0)

## 2020-08-07 LAB — POCT INR: INR: 7.7 — AB (ref 2.0–3.0)

## 2020-08-07 NOTE — Patient Instructions (Addendum)
Description   Called and spoke to pt instructed her to hold warfarin 4/29, 4/30 and 5/1. Come in on 5/2 to have INR checked.   Dose of warfarin decreased to 1.5 tablets daily except for 1 tablet on Fridays. Coumadin Clinic [phone].

## 2020-08-10 ENCOUNTER — Other Ambulatory Visit: Payer: Self-pay

## 2020-08-10 ENCOUNTER — Ambulatory Visit (INDEPENDENT_AMBULATORY_CARE_PROVIDER_SITE_OTHER): Payer: Medicare HMO | Admitting: *Deleted

## 2020-08-10 DIAGNOSIS — I059 Rheumatic mitral valve disease, unspecified: Secondary | ICD-10-CM | POA: Diagnosis not present

## 2020-08-10 DIAGNOSIS — Z5181 Encounter for therapeutic drug level monitoring: Secondary | ICD-10-CM

## 2020-08-10 LAB — POCT INR: INR: 2.5 (ref 2.0–3.0)

## 2020-08-10 NOTE — Patient Instructions (Addendum)
Description   Take 1 tablet of warfarin daily while on bactrim. Recheck INR on 5/6. Coumadin Clinic [phone].   Normal dose: Warfarin 1.5 tablets daily except for 1 tablet on Fridays.

## 2020-08-11 DIAGNOSIS — E1165 Type 2 diabetes mellitus with hyperglycemia: Secondary | ICD-10-CM | POA: Diagnosis not present

## 2020-08-11 DIAGNOSIS — I10 Essential (primary) hypertension: Secondary | ICD-10-CM | POA: Diagnosis not present

## 2020-08-13 ENCOUNTER — Telehealth (HOSPITAL_COMMUNITY): Payer: Self-pay

## 2020-08-13 NOTE — Telephone Encounter (Signed)
Heart Failure Nurse Navigator Progress Note  Called to confirm Heart & Vascular Transitions of Care appointment at [facility] Friday 5/6. Patient reminded to bring all medications and pill box organizer with them. Confirmed patient has transportation. Gave directions, instructed to utilize valet parking.  Confirmed appointment prior to ending call.   Ozella Rocks, RN, BSN Heart Failure Nurse Navigator [phone]

## 2020-08-13 NOTE — Progress Notes (Signed)
[facility] Transitions of Care Clinic  PCP: Georgianne Fick HF Cardiologist: Arvilla Meres EP Cardiologist: Sherryl Manges  HPI:  Deborah [name] is a 75 y.o.  female  with a history of systolic CHF, nonischemic cardiomyopathy, ejection fraction 25% s/p St Jude single chamber ICD in 2009.  She also has a history of mitral valve disease and status post mitral valve replacement with a St. Jude valve in 2005.  T2DM, hypertension, hyperlipidemia, and kidney stones.   CPX performed June 2011 pVO2 16.8 slope 26.1 RER 1.11 O2 pulse 90% predicted O2 sat 87% at peak with possible hypoventilation.  05/12/11 ECHO 20-25%.  Mechanical MVR appears normal.   03/19/13 Echo 25% Mechanical MVR appears normal.   12/04/14 Echo EF 20-25% 08/10/16 EF 20% MVR ok   04/2017 Entresto was increased. Saw Dr Graciela Husbands 01/2018 and coreg was decreased due to dizziness. Overall doing fine. Denies SOB. Does all her housework and grocery store shopping with no problems. Does not do stairs. No BLE edema, orthopnea, or PND. No dizziness. No bleeding on coumadin. Energy level is good. No palpitations. Did not take medications yet today. Has not taken PRN lasix. Does not check BP at home.   Recent admission 07/2020 for supratherapeutic INR, H. Pylori  and upper GI bleed peptic ulcer disease, duodenitis.  Started on PPI, H. Pylori abx and given vit K and ffp to improve INR down to therapeutic range.       Took her medications this morning.  Overall feels good since hospital discharge. Denies shortness of breath.  Sometimes traversing steep inclines she will need to slow her pace down due to shortness of breath.  Does housework, ADL's, goes grocery shopping pushes her own buggy, cooks her own meals.  Sister living with her currently.  Denies any further bleeding or dark stools.  INR 2.5 four days ago.  Weighing herself at home 145-147 has been stable.    ROS: All systems negative except as listed in HPI, PMH and  Problem List.  SH:  Social History   Socioeconomic History  . Marital status: Single    Spouse name: Not on file  . Number of children: Not on file  . Years of education: Not on file  . Highest education level: Not on file  Occupational History  . Occupation: Scientist, research (medical): RETIRED    Comment: disabled  Tobacco Use  . Smoking status: Former Smoker    Quit date: 06/16/1984    Years since quitting: 36.1  . Smokeless tobacco: Never Used  Substance and Sexual Activity  . Alcohol use: No  . Drug use: No  . Sexual activity: Not on file  Other Topics Concern  . Not on file  Social History Narrative   Former smoker, started in early 20's - less than 1 ppd / Quit in her late 20's   Pt is single   Pt has one child, a son   Pt lives alone   Pt is disabled, previously worked as Lawyer x 12 years   Social Determinants of Corporate investment banker Strain: Not on [employer] Insecurity: Not on file  Transportation Needs: Not on file  Physical Activity: Not on file  Stress: Not on file  Social Connections: Not on file  Intimate Partner Violence: Not on file    FH:  Family History  Problem Relation Age of Onset  . Heart disease Mother     Past Medical History:  Diagnosis Date  .  AICD (automatic cardioverter/defibrillator) present   . Aortic stenosis, mild    mean gradient echo 09/09  . CHF (congestive heart failure) (HCC)    --Non-ischemic CM EF 30-35% by echo 9/09 (Previous 25%)  --s/p St. Jude single chamber ICD 04/09 --Minimal  CAD by cath 2004 LAD 20% LCX 20% Ramus 30% RCA nl --echo 05/11 EF 20-25%  . Diabetes mellitus    type 2  . Hyperlipidemia   . Hypertension   . Kidney stones   . MR (congenital mitral regurgitation)    Severe MR s/p St. Jude mechanical MVR by Gasper Lloyd 2005    Current Outpatient Medications  Medication Sig Dispense Refill  . atorvastatin (LIPITOR) 40 MG tablet Take 1 tablet (40 mg total) by mouth daily.    . carvedilol (COREG) 25 MG  tablet Take 25 mg by mouth 2 (two) times daily with a meal.    . HYDROcodone-acetaminophen (NORCO) 7.5-325 MG tablet Take 1 tablet by mouth 2 (two) times daily as needed for moderate pain.     Marland Kitchen ondansetron (ZOFRAN) 4 MG tablet Take 4 mg by mouth 3 (three) times daily as needed for nausea.    . pantoprazole (PROTONIX) 40 MG tablet Take 1 tablet (40 mg total) by mouth 2 (two) times daily. 120 tablet 0  . sacubitril-valsartan (ENTRESTO) 24-26 MG Take 1 tablet by mouth 2 (two) times daily. 60 tablet 0  . temazepam (RESTORIL) 15 MG capsule Take 15 mg by mouth at bedtime as needed for sleep.    Marland Kitchen tiZANidine (ZANAFLEX) 2 MG tablet Take 2 mg by mouth every 8 (eight) hours as needed for muscle spasms.    Marland Kitchen warfarin (COUMADIN) 5 MG tablet Take 1.5 tablets (7.5 mg total) by mouth daily at 4 PM. 30 tablet 0  . bisacodyl (BISACODYL) 5 MG EC tablet Take 5 mg by mouth daily as needed for moderate constipation. (Patient not taking: Reported on 08/14/2020)     No current facility-administered medications for this encounter.    Vitals:   08/14/20 1054  BP: (!) 152/90  Pulse: 80  SpO2: 98%  Weight: 66.6 kg (146 lb 12.8 oz)    PHYSICAL EXAM: Cardiac: JVD 7-8, normal rate and rhythm, clear s1 and s2, no murmurs, rubs or gallops, trace LE edema Pulmonary: CTAB, not in distress Abdominal: non distended abdomen, soft and nontender Psych: Alert, conversant, in good spirits    ASSESSMENT & PLAN:  Chronic Systolic Heart Failure  - nonischemic cardiomyopathy, ejection fraction 25% s/p St Jude single chamber ICD in 2009  - EF 20-25% in 11/2014, Echo 5/18. EF 20% - NYHA II. Volume status stable on exam  - Currently on entresto 24/26, coreg 25  - was planning on adding spiro today however cr returned worse and she is hyperkalemic so no spiro and hold entresto - Just given abx for UTI so no SGLT2i.   - FU with me in one week and I will check bp, volume status and repeat bmp  Mitral valve disease s/p mechanical  MVR in 2005 -on coumadin, check  INR today she has appt with coumadin clinic this afternoon.  HTN - Elevated today, but has not taken medications yet  AKI on CKD stage III: - cr 2.35 today compared with 1.16 on admission, her GDMT was titrated down before discharge - hold spiro and entresto and repeat bmp next week  Iron deficiency anemia: -2/2 recent GI blood loss in the setting of supratherapeutic INR and H. Pylori  -iron  studies checked just before discharge consistent with iron deficiency anemia will arrange IV iron infusion -check cbc and INR today    Follow up with me in one week

## 2020-08-14 ENCOUNTER — Encounter (HOSPITAL_COMMUNITY): Payer: Self-pay

## 2020-08-14 ENCOUNTER — Ambulatory Visit (HOSPITAL_COMMUNITY)
Admit: 2020-08-14 | Discharge: 2020-08-14 | Disposition: A | Discharge status: Home / Self Care | Payer: Medicare HMO | Source: Ambulatory Visit | Attending: Internal Medicine | Admitting: Internal Medicine

## 2020-08-14 ENCOUNTER — Other Ambulatory Visit (HOSPITAL_COMMUNITY): Payer: Self-pay | Admitting: Cardiology

## 2020-08-14 ENCOUNTER — Other Ambulatory Visit: Payer: Self-pay

## 2020-08-14 ENCOUNTER — Telehealth (HOSPITAL_COMMUNITY): Payer: Self-pay | Admitting: Cardiology

## 2020-08-14 VITALS — BP 152/90 | HR 80 | Wt 146.8 lb

## 2020-08-14 DIAGNOSIS — N183 Chronic kidney disease, stage 3 unspecified: Secondary | ICD-10-CM | POA: Diagnosis not present

## 2020-08-14 DIAGNOSIS — R0602 Shortness of breath: Secondary | ICD-10-CM | POA: Diagnosis not present

## 2020-08-14 DIAGNOSIS — I428 Other cardiomyopathies: Secondary | ICD-10-CM | POA: Insufficient documentation

## 2020-08-14 DIAGNOSIS — I5022 Chronic systolic (congestive) heart failure: Secondary | ICD-10-CM

## 2020-08-14 DIAGNOSIS — N179 Acute kidney failure, unspecified: Secondary | ICD-10-CM | POA: Diagnosis not present

## 2020-08-14 DIAGNOSIS — Z952 Presence of prosthetic heart valve: Secondary | ICD-10-CM

## 2020-08-14 DIAGNOSIS — I130 Hypertensive heart and chronic kidney disease with heart failure and stage 1 through stage 4 chronic kidney disease, or unspecified chronic kidney disease: Secondary | ICD-10-CM | POA: Diagnosis not present

## 2020-08-14 DIAGNOSIS — Z7901 Long term (current) use of anticoagulants: Secondary | ICD-10-CM | POA: Insufficient documentation

## 2020-08-14 DIAGNOSIS — Z8249 Family history of ischemic heart disease and other diseases of the circulatory system: Secondary | ICD-10-CM | POA: Insufficient documentation

## 2020-08-14 DIAGNOSIS — D649 Anemia, unspecified: Secondary | ICD-10-CM

## 2020-08-14 DIAGNOSIS — D509 Iron deficiency anemia, unspecified: Secondary | ICD-10-CM | POA: Insufficient documentation

## 2020-08-14 DIAGNOSIS — Z79899 Other long term (current) drug therapy: Secondary | ICD-10-CM | POA: Diagnosis not present

## 2020-08-14 DIAGNOSIS — Z87891 Personal history of nicotine dependence: Secondary | ICD-10-CM | POA: Insufficient documentation

## 2020-08-14 DIAGNOSIS — I10 Essential (primary) hypertension: Secondary | ICD-10-CM | POA: Diagnosis not present

## 2020-08-14 DIAGNOSIS — Z95810 Presence of automatic (implantable) cardiac defibrillator: Secondary | ICD-10-CM | POA: Diagnosis not present

## 2020-08-14 LAB — CBC
HCT: 27.8 % — ABNORMAL LOW (ref 36.0–46.0)
Hemoglobin: 8.7 g/dL — ABNORMAL LOW (ref 12.0–15.0)
MCH: 30.2 pg (ref 26.0–34.0)
MCHC: 31.3 g/dL (ref 30.0–36.0)
MCV: 96.5 fL (ref 80.0–100.0)
Platelets: 255 10*3/uL (ref 150–400)
RBC: 2.88 MIL/uL — ABNORMAL LOW (ref 3.87–5.11)
RDW: 14.9 % (ref 11.5–15.5)
WBC: 7.8 10*3/uL (ref 4.0–10.5)
nRBC: 0 % (ref 0.0–0.2)

## 2020-08-14 LAB — PROTIME-INR
INR: 1.9 — ABNORMAL HIGH (ref 0.8–1.2)
Prothrombin Time: 21.6 seconds — ABNORMAL HIGH (ref 11.4–15.2)

## 2020-08-14 LAB — BASIC METABOLIC PANEL
Anion gap: 5 (ref 5–15)
BUN: 32 mg/dL — ABNORMAL HIGH (ref 8–23)
CO2: 23 mmol/L (ref 22–32)
Calcium: 8.9 mg/dL (ref 8.9–10.3)
Chloride: 111 mmol/L (ref 98–111)
Creatinine, Ser: 2.35 mg/dL — ABNORMAL HIGH (ref 0.44–1.00)
GFR, Estimated: 21 mL/min — ABNORMAL LOW (ref >60)
Glucose, Bld: 110 mg/dL — ABNORMAL HIGH (ref 70–99)
Potassium: 5.5 mmol/L — ABNORMAL HIGH (ref 3.5–5.1)
Sodium: 139 mmol/L (ref 135–145)

## 2020-08-14 MED ORDER — SPIRONOLACTONE 25 MG PO TABS: 12.5000 mg | ORAL_TABLET | Freq: Every day | ORAL | 3 refills | Status: DC

## 2020-08-14 NOTE — Patient Instructions (Addendum)
Hold spironolactone and Entresto until your follow up appointment with me next week.    Your physician recommends that you schedule a follow-up appointment in: 4-6 weeks  in the Advanced Practitioners (PA/NP) Clinic   You have been referred to [facility] -see appt details  Do the following things EVERYDAY: 1) Weigh yourself in the morning before breakfast. Write it down and keep it in a log. 2) Take your medicines as prescribed 3) Eat low salt foods--Limit salt (sodium) to 2000 mg per day.  4) Stay as active as you can everyday 5) Limit all fluids for the day to less than 2 liters  At the Advanced Heart Failure Clinic, you and your health needs are our priority. As part of our continuing mission to provide you with exceptional heart care, we have created designated Provider Care Teams. These Care Teams include your primary Cardiologist (physician) and Advanced Practice Providers (APPs- Physician Assistants and Nurse Practitioners) who all work together to provide you with the care you need, when you need it.   You may see any of the following providers on your designated Care Team at your next follow up: Marland Kitchen Dr Arvilla Meres . Dr Marca Ancona . Dr Thornell Mule . Tonye Becket, NP . Robbie Lis, PA . Shanda Bumps [place],NP . Karle Plumber, PharmD   Please be sure to bring in all your medications bottles to every appointment.   If you have any questions or concerns before your next appointment please send [place] a message through Erick or call our office at [phone].    TO LEAVE A MESSAGE FOR THE NURSE SELECT OPTION 2, PLEASE LEAVE A MESSAGE INCLUDING: . YOUR NAME . DATE OF BIRTH . CALL BACK NUMBER . REASON FOR CALL**this is important as we prioritize the call backs  YOU WILL RECEIVE A CALL BACK THE SAME DAY AS LONG AS YOU CALL BEFORE 4:00 PM

## 2020-08-14 NOTE — Telephone Encounter (Signed)
Per Dr Frances Furbish  K and Cr elevated Hold spiro/dont start Hold entresto  Return for OV next week with labs   Pt aware and voiced understanding  return to clinic 5/13 @ 11 for f/u

## 2020-08-18 DIAGNOSIS — I10 Essential (primary) hypertension: Secondary | ICD-10-CM | POA: Diagnosis not present

## 2020-08-18 DIAGNOSIS — E1165 Type 2 diabetes mellitus with hyperglycemia: Secondary | ICD-10-CM | POA: Diagnosis not present

## 2020-08-18 DIAGNOSIS — I5022 Chronic systolic (congestive) heart failure: Secondary | ICD-10-CM | POA: Diagnosis not present

## 2020-08-18 DIAGNOSIS — D509 Iron deficiency anemia, unspecified: Secondary | ICD-10-CM | POA: Diagnosis not present

## 2020-08-18 DIAGNOSIS — E782 Mixed hyperlipidemia: Secondary | ICD-10-CM | POA: Diagnosis not present

## 2020-08-18 DIAGNOSIS — I130 Hypertensive heart and chronic kidney disease with heart failure and stage 1 through stage 4 chronic kidney disease, or unspecified chronic kidney disease: Secondary | ICD-10-CM | POA: Diagnosis not present

## 2020-08-19 ENCOUNTER — Ambulatory Visit (INDEPENDENT_AMBULATORY_CARE_PROVIDER_SITE_OTHER): Payer: Medicare HMO | Admitting: *Deleted

## 2020-08-19 ENCOUNTER — Other Ambulatory Visit: Payer: Self-pay

## 2020-08-19 DIAGNOSIS — I059 Rheumatic mitral valve disease, unspecified: Secondary | ICD-10-CM | POA: Diagnosis not present

## 2020-08-19 DIAGNOSIS — Z5181 Encounter for therapeutic drug level monitoring: Secondary | ICD-10-CM

## 2020-08-19 LAB — POCT INR: INR: 1.3 — AB (ref 2.0–3.0)

## 2020-08-19 NOTE — Patient Instructions (Signed)
Description   Today and tomorrow take 2 tablets then continue taking 1.5 tablets daily except 1 tablet on Fridays. Plan to eat 1 green leafy vegetable a week and livers & stay consistent weekly. Recheck INR in 1 week. Coumadin Clinic [phone].

## 2020-08-20 NOTE — Progress Notes (Signed)
Remote ICD transmission.   

## 2020-08-21 ENCOUNTER — Telehealth (HOSPITAL_COMMUNITY): Payer: Self-pay

## 2020-08-21 ENCOUNTER — Other Ambulatory Visit (HOSPITAL_COMMUNITY): Payer: Self-pay

## 2020-08-21 ENCOUNTER — Other Ambulatory Visit: Payer: Self-pay

## 2020-08-21 ENCOUNTER — Other Ambulatory Visit (HOSPITAL_COMMUNITY): Payer: Medicare HMO

## 2020-08-21 ENCOUNTER — Ambulatory Visit (HOSPITAL_COMMUNITY)
Admission: RE | Admit: 2020-08-21 | Discharge: 2020-08-21 | Disposition: A | Discharge status: Home / Self Care | Payer: Medicare HMO | Source: Ambulatory Visit | Attending: Internal Medicine | Admitting: Internal Medicine

## 2020-08-21 ENCOUNTER — Encounter (HOSPITAL_COMMUNITY): Payer: Self-pay

## 2020-08-21 VITALS — BP 150/82 | HR 71 | Wt 149.0 lb

## 2020-08-21 DIAGNOSIS — Z8711 Personal history of peptic ulcer disease: Secondary | ICD-10-CM | POA: Diagnosis not present

## 2020-08-21 DIAGNOSIS — N390 Urinary tract infection, site not specified: Secondary | ICD-10-CM | POA: Insufficient documentation

## 2020-08-21 DIAGNOSIS — N183 Chronic kidney disease, stage 3 unspecified: Secondary | ICD-10-CM | POA: Diagnosis not present

## 2020-08-21 DIAGNOSIS — Z95810 Presence of automatic (implantable) cardiac defibrillator: Secondary | ICD-10-CM | POA: Insufficient documentation

## 2020-08-21 DIAGNOSIS — E1122 Type 2 diabetes mellitus with diabetic chronic kidney disease: Secondary | ICD-10-CM | POA: Insufficient documentation

## 2020-08-21 DIAGNOSIS — I5022 Chronic systolic (congestive) heart failure: Secondary | ICD-10-CM | POA: Diagnosis not present

## 2020-08-21 DIAGNOSIS — Z8249 Family history of ischemic heart disease and other diseases of the circulatory system: Secondary | ICD-10-CM | POA: Insufficient documentation

## 2020-08-21 DIAGNOSIS — Z952 Presence of prosthetic heart valve: Secondary | ICD-10-CM | POA: Insufficient documentation

## 2020-08-21 DIAGNOSIS — I428 Other cardiomyopathies: Secondary | ICD-10-CM | POA: Insufficient documentation

## 2020-08-21 DIAGNOSIS — I130 Hypertensive heart and chronic kidney disease with heart failure and stage 1 through stage 4 chronic kidney disease, or unspecified chronic kidney disease: Secondary | ICD-10-CM | POA: Diagnosis not present

## 2020-08-21 DIAGNOSIS — D509 Iron deficiency anemia, unspecified: Secondary | ICD-10-CM | POA: Insufficient documentation

## 2020-08-21 DIAGNOSIS — Z87891 Personal history of nicotine dependence: Secondary | ICD-10-CM | POA: Diagnosis not present

## 2020-08-21 DIAGNOSIS — Z79899 Other long term (current) drug therapy: Secondary | ICD-10-CM | POA: Insufficient documentation

## 2020-08-21 DIAGNOSIS — Z8719 Personal history of other diseases of the digestive system: Secondary | ICD-10-CM | POA: Insufficient documentation

## 2020-08-21 DIAGNOSIS — N179 Acute kidney failure, unspecified: Secondary | ICD-10-CM | POA: Diagnosis not present

## 2020-08-21 DIAGNOSIS — Z7901 Long term (current) use of anticoagulants: Secondary | ICD-10-CM | POA: Insufficient documentation

## 2020-08-21 DIAGNOSIS — E875 Hyperkalemia: Secondary | ICD-10-CM | POA: Diagnosis not present

## 2020-08-21 LAB — BASIC METABOLIC PANEL
Anion gap: 6 (ref 5–15)
BUN: 27 mg/dL — ABNORMAL HIGH (ref 8–23)
CO2: 23 mmol/L (ref 22–32)
Calcium: 8.8 mg/dL — ABNORMAL LOW (ref 8.9–10.3)
Chloride: 113 mmol/L — ABNORMAL HIGH (ref 98–111)
Creatinine, Ser: 1.36 mg/dL — ABNORMAL HIGH (ref 0.44–1.00)
GFR, Estimated: 40 mL/min — ABNORMAL LOW (ref >60)
Glucose, Bld: 99 mg/dL (ref 70–99)
Potassium: 5.1 mmol/L (ref 3.5–5.1)
Sodium: 142 mmol/L (ref 135–145)

## 2020-08-21 MED ORDER — BIDIL 20-37.5 MG PO TABS: 1.0000 | ORAL_TABLET | Freq: Three times a day (TID) | ORAL | 3 refills | Status: DC

## 2020-08-21 MED ORDER — CARVEDILOL 12.5 MG PO TABS: 12.5000 mg | ORAL_TABLET | Freq: Two times a day (BID) | ORAL | 3 refills | Status: DC

## 2020-08-21 NOTE — Telephone Encounter (Signed)
Heart Failure Nurse Navigator Progress Note  Called to confirm Heart & Vascular Transitions of Care appointment at 11AM. Patient reminded to bring all medications and pill box organizer with them. Confirmed patient has transportation. Gave directions, instructed to utilize valet parking.  Confirmed appointment prior to ending call.   Ellianne Gowen, RN, BSN Heart Failure Nurse Navigator [phone]  

## 2020-08-21 NOTE — Patient Instructions (Signed)
Labs done today. We will contact you only if your labs are abnormal.  START Bidil 20-37.5mg  (1 tablet) by mouth 3 times daily.   DECREASE Carvedilol to 12.5mg  ( 1 tablet) by mouth 2 times daily.   No other medication changes were made. Please continue all current medications as prescribed.  If you have any questions or concerns before your next appointment please send [place] a message through [place] or call our office at [phone].    TO LEAVE A MESSAGE FOR THE NURSE SELECT OPTION 2, PLEASE LEAVE A MESSAGE INCLUDING: . YOUR NAME . DATE OF BIRTH . CALL BACK NUMBER . REASON FOR CALL**this is important as we prioritize the call backs  YOU WILL RECEIVE A CALL BACK THE SAME DAY AS LONG AS YOU CALL BEFORE 4:00 PM   Do the following things EVERYDAY: 1) Weigh yourself in the morning before breakfast. Write it down and keep it in a log. 2) Take your medicines as prescribed 3) Eat low salt foods--Limit salt (sodium) to 2000 mg per day.  4) Stay as active as you can everyday 5) Limit all fluids for the day to less than 2 liters   At the [facility], you and your health needs are our priority. As part of our continuing mission to provide you with exceptional heart care, we have created designated Provider Care Teams. These Care Teams include your primary Cardiologist (physician) and Advanced Practice Providers (APPs- Physician Assistants and Nurse Practitioners) who all work together to provide you with the care you need, when you need it.   You may see any of the following providers on your designated Care Team at your next follow up: Marland Kitchen Dr Arvilla Meres . Dr Marca Ancona . Tonye Becket, NP . Robbie Lis, PA . Karle Plumber, PharmD   Please be sure to bring in all your medications bottles to every appointment.

## 2020-08-21 NOTE — Progress Notes (Signed)
[facility]  PCP: Georgianne Fick HF Cardiologist: Arvilla Meres EP Cardiologist: Sherryl Manges  HPI:  Deborah [name] is a 75 y.o.  female  with a history of systolic CHF, nonischemic cardiomyopathy, ejection fraction 25% s/p St Jude single chamber ICD in 2009.  She also has a history of mitral valve disease and status post mitral valve replacement with a St. Jude valve in 2005.  T2DM, hypertension, hyperlipidemia, and kidney stones.   CPX performed June 2011 pVO2 16.8 slope 26.1 RER 1.11 O2 pulse 90% predicted O2 sat 87% at peak with possible hypoventilation.  05/12/11 ECHO 20-25%.  Mechanical MVR appears normal.   03/19/13 Echo 25% Mechanical MVR appears normal.   12/04/14 Echo EF 20-25% 08/10/16 EF 20% MVR ok   04/2017 Entresto was increased. Saw Dr Graciela Husbands 01/2018 and coreg was decreased due to dizziness. Overall doing fine. Denies SOB. Does all her housework and grocery store shopping with no problems. Does not do stairs. No BLE edema, orthopnea, or PND. No dizziness. No bleeding on coumadin. Energy level is good. No palpitations. Did not take medications yet today. Has not taken PRN lasix. Does not check BP at home.   Recent admission 07/2020 for supratherapeutic INR, H. Pylori  and upper GI bleed peptic ulcer disease, duodenitis.  Started on PPI, H. Pylori abx and given vit K and ffp to improve INR down to therapeutic range.       Last visit was doing well, we were going to attempt to continue GDMT titration but labs returned with hyperkalemia and AKI so we had to hold off on adding spiro and held her Entresto.    Weight stable today by her scales up a little by ours.  Denies shortness of breath with activity or at rest.  Is able to do housework, ADL's, goes grocery shopping pushes her own buggy, cooks her own meals.  Sister living with her currently.  Denies any further bleeding or dark stools.  Says she held her entresto as directed but  decided to take it today before the visit.     ROS: All systems negative except as listed in HPI, PMH and Problem List.  SH:  Social History   Socioeconomic History  . Marital status: Single    Spouse name: Not on file  . Number of children: Not on file  . Years of education: Not on file  . Highest education level: Not on file  Occupational History  . Occupation: Scientist, research (medical): RETIRED    Comment: disabled  Tobacco Use  . Smoking status: Former Smoker    Quit date: 06/16/1984    Years since quitting: 36.2  . Smokeless tobacco: Never Used  Substance and Sexual Activity  . Alcohol use: No  . Drug use: No  . Sexual activity: Not on file  Other Topics Concern  . Not on file  Social History Narrative   Former smoker, started in early 20's - less than 1 ppd / Quit in her late 20's   Pt is single   Pt has one child, a son   Pt lives alone   Pt is disabled, previously worked as Lawyer x 12 years   Social Determinants of Corporate investment banker Strain: Not on [employer] Insecurity: Not on file  Transportation Needs: Not on file  Physical Activity: Not on file  Stress: Not on file  Social Connections: Not on file  Intimate Partner Violence: Not  on file    FH:  Family History  Problem Relation Age of Onset  . Heart disease Mother     Past Medical History:  Diagnosis Date  . AICD (automatic cardioverter/defibrillator) present   . Aortic stenosis, mild    mean gradient echo 09/09  . CHF (congestive heart failure) (HCC)    --Non-ischemic CM EF 30-35% by echo 9/09 (Previous 25%)  --s/p St. Jude single chamber ICD 04/09 --Minimal  CAD by cath 2004 LAD 20% LCX 20% Ramus 30% RCA nl --echo 05/11 EF 20-25%  . Diabetes mellitus    type 2  . Hyperlipidemia   . Hypertension   . Kidney stones   . MR (congenital mitral regurgitation)    Severe MR s/p St. Jude mechanical MVR by Gasper Lloyd 2005    Current Outpatient Medications  Medication Sig Dispense Refill  .  atorvastatin (LIPITOR) 40 MG tablet Take 1 tablet (40 mg total) by mouth daily.    Marland Kitchen HYDROcodone-acetaminophen (NORCO) 7.5-325 MG tablet Take 1 tablet by mouth 2 (two) times daily as needed for moderate pain.     . isosorbide-hydrALAZINE (BIDIL) 20-37.5 MG tablet Take 1 tablet by mouth 3 (three) times daily. 270 tablet 3  . pantoprazole (PROTONIX) 40 MG tablet Take 1 tablet (40 mg total) by mouth 2 (two) times daily. 120 tablet 0  . temazepam (RESTORIL) 15 MG capsule Take 15 mg by mouth at bedtime as needed for sleep.    Marland Kitchen tiZANidine (ZANAFLEX) 2 MG tablet Take 2 mg by mouth every 8 (eight) hours as needed for muscle spasms.    Marland Kitchen warfarin (COUMADIN) 5 MG tablet Take 1.5 tablets (7.5 mg total) by mouth daily at 4 PM. 30 tablet 0  . carvedilol (COREG) 12.5 MG tablet Take 1 tablet (12.5 mg total) by mouth 2 (two) times daily with a meal. 180 tablet 3   No current facility-administered medications for this encounter.    Vitals:   08/21/20 1102  BP: (!) 150/82  Pulse: 71  SpO2: 100%  Weight: 67.6 kg (149 lb)    PHYSICAL EXAM: Cardiac: JVD 8, normal rate and rhythm, clear s1 and s2, no murmurs, rubs or gallops, trace LE edema Pulmonary: CTAB, not in distress Abdominal: non distended abdomen, soft and nontender Psych: Alert, conversant, in good spirits    ASSESSMENT & PLAN:  Chronic Systolic Heart Failure  - nonischemic cardiomyopathy, ejection fraction 25% s/p St Jude single chamber ICD in 2009  - EF 20-25% in 11/2014, Echo 08/2016. EF 15% - 07/2020 EF 20-25%, RWMA, normal functioning mechanical mitral valve.  Moderately elevated PASP - with RWMA it is possible she has developed CAD since 2009 but denies any chest discomfort, risks of repeat ischemic eval certainly outweigh benefits currently - NYHA II. Volume status stable on exam  - last visit was planning on adding spiro however cr returned worse and she was hyperkalemic. Was able to call pharmacy she never picked spiro up and we were  able to get them to cancel this.  Instructed to hold entresto and we will not be starting spiro and says she never went to pick up spiro as instructed and held her entresto but she did decide to take it this morning before the visit for unclear reasons although I suspect so she would have a better bp for the visit given how upset she was that bp was high.  - Instructed her to continue holding entresto for now and asked her if I could keep  it for her here in the clinic until okay to start it back but she refused - Start bidil 1 tab TID, decrease carvedilol to 12.5mg  BID to help aid in renal recovery - perhaps renal function not currently tolerating entresto due to worsening EF and/or significant decrease in her baseline hgb after GI bleed.   - We have scheduled her to get an iron infusion - Just given abx for UTI so no SGLT2i.   - Has FU visit with [facility] in two weeks to reestablish care  Mitral valve disease s/p mechanical MVR in 2005 -on coumadin, coumadin clinic managing  HTN - elevated today, will make changes above  AKI on CKD stage III: - cr 2.35 last visit compared with 1.16 on discharge from recent admission. - continue holding spiro and entresto.  - recheck bmp today expect it will be improved   Iron deficiency anemia: -2/2 recent upper GI blood loss in the setting of supratherapeutic INR and H. Pylori both have been treated -iron studies checked just before discharge consistent with iron deficiency anemia  -denies further bleeding -Has IV iron infusion scheduled for 08/25/20   Follow up with [facility] to re-establish care and continue medication optimization in 2 weeks

## 2020-08-21 NOTE — Addendum Note (Signed)
Encounter addended by: Angelita Ingles, MD on: 08/21/2020 1:39 PM  Actions taken: Level of Service modified

## 2020-08-24 NOTE — Discharge Instructions (Signed)
Ferumoxytol injection What is this medicine? FERUMOXYTOL is an iron complex. Iron is used to make healthy red blood cells, which carry oxygen and nutrients throughout the body. This medicine is used to treat iron deficiency anemia. This medicine may be used for other purposes; ask your health care provider or pharmacist if you have questions. COMMON BRAND NAME(S): Feraheme What should I tell my health care provider before I take this medicine? They need to know if you have any of these conditions:  anemia not caused by low iron levels  high levels of iron in the blood  magnetic resonance imaging (MRI) test scheduled  an unusual or allergic reaction to iron, other medicines, foods, dyes, or preservatives  pregnant or trying to get pregnant  breast-feeding How should I use this medicine? This medicine is for injection into a vein. It is given by a health care professional in a hospital or clinic setting. Talk to your pediatrician regarding the use of this medicine in children. Special care may be needed. Overdosage: If you think you have taken too much of this medicine contact a poison control center or emergency room at once. NOTE: This medicine is only for you. Do not share this medicine with others. What if I miss a dose? It is important not to miss your dose. Call your doctor or health care professional if you are unable to keep an appointment. What may interact with this medicine? This medicine may interact with the following medications:  other iron products This list may not describe all possible interactions. Give your health care provider a list of all the medicines, herbs, non-prescription drugs, or dietary supplements you use. Also tell them if you smoke, drink alcohol, or use illegal drugs. Some items may interact with your medicine. What should I watch for while using this medicine? Visit your doctor or healthcare professional regularly. Tell your doctor or healthcare  professional if your symptoms do not start to get better or if they get worse. You may need blood work done while you are taking this medicine. You may need to follow a special diet. Talk to your doctor. Foods that contain iron include: whole grains/cereals, dried fruits, beans, or peas, leafy green vegetables, and organ meats (liver, kidney). What side effects may I notice from receiving this medicine? Side effects that you should report to your doctor or health care professional as soon as possible:  allergic reactions like skin rash, itching or hives, swelling of the face, lips, or tongue  breathing problems  changes in blood pressure  feeling faint or lightheaded, falls  fever or chills  flushing, sweating, or hot feelings  swelling of the ankles or feet Side effects that usually do not require medical attention (report to your doctor or health care professional if they continue or are bothersome):  diarrhea  headache  nausea, vomiting  stomach pain This list may not describe all possible side effects. Call your doctor for medical advice about side effects. You may report side effects to FDA at 1-800-FDA-1088. Where should I keep my medicine? This drug is given in a hospital or clinic and will not be stored at home. NOTE: This sheet is a summary. It may not cover all possible information. If you have questions about this medicine, talk to your doctor, pharmacist, or health care provider.  2021 Elsevier/Gold Standard (2016-05-16 20:21:10)  

## 2020-08-25 ENCOUNTER — Inpatient Hospital Stay (HOSPITAL_COMMUNITY)
Admission: RE | Admit: 2020-08-25 | Discharge: 2020-08-25 | Disposition: A | Discharge status: Home / Self Care | Payer: Medicare HMO | Source: Ambulatory Visit | Attending: Internal Medicine | Admitting: Internal Medicine

## 2020-08-25 ENCOUNTER — Encounter (HOSPITAL_COMMUNITY): Payer: Self-pay

## 2020-08-26 ENCOUNTER — Ambulatory Visit (INDEPENDENT_AMBULATORY_CARE_PROVIDER_SITE_OTHER): Payer: Medicare HMO | Admitting: *Deleted

## 2020-08-26 ENCOUNTER — Other Ambulatory Visit: Payer: Self-pay

## 2020-08-26 DIAGNOSIS — Z5181 Encounter for therapeutic drug level monitoring: Secondary | ICD-10-CM | POA: Diagnosis not present

## 2020-08-26 DIAGNOSIS — I059 Rheumatic mitral valve disease, unspecified: Secondary | ICD-10-CM

## 2020-08-26 LAB — POCT INR: INR: 3.7 — AB (ref 2.0–3.0)

## 2020-08-26 NOTE — Patient Instructions (Signed)
Description   Today take 1 tablet then continue taking 1.5 tablets daily except 1 tablet on Fridays. Continue to eat 1-2 servings of green leafy veggies a week & stay consistent weekly. Recheck INR in 2 weeks. [facility]in Clinic [phone].

## 2020-08-28 DIAGNOSIS — E119 Type 2 diabetes mellitus without complications: Secondary | ICD-10-CM | POA: Diagnosis not present

## 2020-09-09 NOTE — Discharge Instructions (Signed)
Ferumoxytol injection What is this medicine? FERUMOXYTOL is an iron complex. Iron is used to make healthy red blood cells, which carry oxygen and nutrients throughout the body. This medicine is used to treat iron deficiency anemia. This medicine may be used for other purposes; ask your health care provider or pharmacist if you have questions. COMMON BRAND NAME(S): Feraheme What should I tell my health care provider before I take this medicine? They need to know if you have any of these conditions:  anemia not caused by low iron levels  high levels of iron in the blood  magnetic resonance imaging (MRI) test scheduled  an unusual or allergic reaction to iron, other medicines, foods, dyes, or preservatives  pregnant or trying to get pregnant  breast-feeding How should I use this medicine? This medicine is for injection into a vein. It is given by a health care professional in a hospital or clinic setting. Talk to your pediatrician regarding the use of this medicine in children. Special care may be needed. Overdosage: If you think you have taken too much of this medicine contact a poison control center or emergency room at once. NOTE: This medicine is only for you. Do not share this medicine with others. What if I miss a dose? It is important not to miss your dose. Call your doctor or health care professional if you are unable to keep an appointment. What may interact with this medicine? This medicine may interact with the following medications:  other iron products This list may not describe all possible interactions. Give your health care provider a list of all the medicines, herbs, non-prescription drugs, or dietary supplements you use. Also tell them if you smoke, drink alcohol, or use illegal drugs. Some items may interact with your medicine. What should I watch for while using this medicine? Visit your doctor or healthcare professional regularly. Tell your doctor or healthcare  professional if your symptoms do not start to get better or if they get worse. You may need blood work done while you are taking this medicine. You may need to follow a special diet. Talk to your doctor. Foods that contain iron include: whole grains/cereals, dried fruits, beans, or peas, leafy green vegetables, and organ meats (liver, kidney). What side effects may I notice from receiving this medicine? Side effects that you should report to your doctor or health care professional as soon as possible:  allergic reactions like skin rash, itching or hives, swelling of the face, lips, or tongue  breathing problems  changes in blood pressure  feeling faint or lightheaded, falls  fever or chills  flushing, sweating, or hot feelings  swelling of the ankles or feet Side effects that usually do not require medical attention (report to your doctor or health care professional if they continue or are bothersome):  diarrhea  headache  nausea, vomiting  stomach pain This list may not describe all possible side effects. Call your doctor for medical advice about side effects. You may report side effects to FDA at 1-800-FDA-1088. Where should I keep my medicine? This drug is given in a hospital or clinic and will not be stored at home. NOTE: This sheet is a summary. It may not cover all possible information. If you have questions about this medicine, talk to your doctor, pharmacist, or health care provider.  2021 Elsevier/Gold Standard (2016-05-16 20:21:10)  

## 2020-09-10 ENCOUNTER — Ambulatory Visit: Payer: Medicare HMO | Admitting: Pharmacist

## 2020-09-10 ENCOUNTER — Other Ambulatory Visit: Payer: Self-pay

## 2020-09-10 ENCOUNTER — Encounter (HOSPITAL_COMMUNITY): Payer: Medicare HMO

## 2020-09-10 ENCOUNTER — Ambulatory Visit (HOSPITAL_COMMUNITY)
Admission: RE | Admit: 2020-09-10 | Discharge: 2020-09-10 | Disposition: A | Discharge status: Home / Self Care | Payer: Medicare HMO | Source: Ambulatory Visit | Attending: Internal Medicine | Admitting: Internal Medicine

## 2020-09-10 DIAGNOSIS — Z5181 Encounter for therapeutic drug level monitoring: Secondary | ICD-10-CM | POA: Diagnosis not present

## 2020-09-10 DIAGNOSIS — D649 Anemia, unspecified: Secondary | ICD-10-CM | POA: Diagnosis not present

## 2020-09-10 DIAGNOSIS — I059 Rheumatic mitral valve disease, unspecified: Secondary | ICD-10-CM

## 2020-09-10 LAB — POCT INR: INR: 3.4 — AB (ref 2.0–3.0)

## 2020-09-10 MED ORDER — SODIUM CHLORIDE 0.9 % IV SOLN
510.0000 mg | Freq: Once | INTRAVENOUS | Status: AC
  Administered 2020-09-10: 510 mg via INTRAVENOUS
  Filled 2020-09-10: qty 510

## 2020-09-10 MED ORDER — FERUMOXYTOL INJECTION 510 MG/17 ML: 510.0000 mg | Freq: Once | INTRAVENOUS | Status: DC

## 2020-09-10 NOTE — Patient Instructions (Signed)
Continue taking 1.5 tablets daily except 1 tablet on Fridays. Continue to eat 1-2 servings of green leafy veggies a week & stay consistent weekly. Recheck INR in 3 weeks. [facility] [phone].

## 2020-10-01 ENCOUNTER — Ambulatory Visit: Payer: Medicare HMO | Admitting: *Deleted

## 2020-10-01 ENCOUNTER — Other Ambulatory Visit: Payer: Self-pay

## 2020-10-01 DIAGNOSIS — I059 Rheumatic mitral valve disease, unspecified: Secondary | ICD-10-CM

## 2020-10-01 DIAGNOSIS — Z5181 Encounter for therapeutic drug level monitoring: Secondary | ICD-10-CM | POA: Diagnosis not present

## 2020-10-01 LAB — POCT INR: INR: 2 (ref 2.0–3.0)

## 2020-10-01 NOTE — Patient Instructions (Addendum)
Description   Today take another 1/2 tablet (already taken 1.5 tablets today so a total of 2 tablets today) then continue taking 1.5 tablets daily except 1 tablet on Fridays. Continue to eat 1-2 servings of green leafy veggies a week & stay consistent weekly. Recheck INR in 3 weeks. [facility] [phone].

## 2020-10-19 DIAGNOSIS — E782 Mixed hyperlipidemia: Secondary | ICD-10-CM | POA: Diagnosis not present

## 2020-10-19 DIAGNOSIS — I130 Hypertensive heart and chronic kidney disease with heart failure and stage 1 through stage 4 chronic kidney disease, or unspecified chronic kidney disease: Secondary | ICD-10-CM | POA: Diagnosis not present

## 2020-10-19 DIAGNOSIS — I10 Essential (primary) hypertension: Secondary | ICD-10-CM | POA: Diagnosis not present

## 2020-10-19 DIAGNOSIS — E1165 Type 2 diabetes mellitus with hyperglycemia: Secondary | ICD-10-CM | POA: Diagnosis not present

## 2020-10-19 DIAGNOSIS — D508 Other iron deficiency anemias: Secondary | ICD-10-CM | POA: Diagnosis not present

## 2020-10-19 DIAGNOSIS — D509 Iron deficiency anemia, unspecified: Secondary | ICD-10-CM | POA: Diagnosis not present

## 2020-10-19 DIAGNOSIS — I5022 Chronic systolic (congestive) heart failure: Secondary | ICD-10-CM | POA: Diagnosis not present

## 2020-10-22 ENCOUNTER — Other Ambulatory Visit: Payer: Self-pay

## 2020-10-22 ENCOUNTER — Ambulatory Visit: Payer: Medicare HMO

## 2020-10-22 DIAGNOSIS — Z5181 Encounter for therapeutic drug level monitoring: Secondary | ICD-10-CM | POA: Diagnosis not present

## 2020-10-22 DIAGNOSIS — I059 Rheumatic mitral valve disease, unspecified: Secondary | ICD-10-CM | POA: Diagnosis not present

## 2020-10-22 LAB — POCT INR: INR: 1.9 — AB (ref 2.0–3.0)

## 2020-10-22 NOTE — Patient Instructions (Signed)
Description   Today take another 1/2 tablet (already taken 1.5 tablets today so a total of 2 tablets today) then start taking 1.5 tablets daily. Continue to eat 1-2 servings of green leafy veggies a week & stay consistent weekly. Recheck INR in 2 weeks. [facility]in Clinic [phone].

## 2020-11-02 ENCOUNTER — Ambulatory Visit (INDEPENDENT_AMBULATORY_CARE_PROVIDER_SITE_OTHER): Payer: Medicare HMO

## 2020-11-02 DIAGNOSIS — I428 Other cardiomyopathies: Secondary | ICD-10-CM | POA: Diagnosis not present

## 2020-11-04 LAB — CUP PACEART REMOTE DEVICE CHECK
Battery Remaining Longevity: 58 mo
Battery Remaining Percentage: 64 %
Battery Voltage: 2.96 V
Brady Statistic RV Percent Paced: 1 %
Date Time Interrogation Session: 20220726093108
HighPow Impedance: 38 Ohm
HighPow Impedance: 38 Ohm
Implantable Lead Implant Date: 20090410
Implantable Lead Location: 753860
Implantable Lead Model: 7120
Implantable Pulse Generator Implant Date: 20180423
Lead Channel Impedance Value: 390 Ohm
Lead Channel Pacing Threshold Amplitude: 0.5 V
Lead Channel Pacing Threshold Pulse Width: 0.5 ms
Lead Channel Sensing Intrinsic Amplitude: 12 mV
Lead Channel Setting Pacing Amplitude: 2.5 V
Lead Channel Setting Pacing Pulse Width: 0.5 ms
Lead Channel Setting Sensing Sensitivity: 0.5 mV
Pulse Gen Serial Number: 7423042

## 2020-11-05 ENCOUNTER — Ambulatory Visit (INDEPENDENT_AMBULATORY_CARE_PROVIDER_SITE_OTHER): Payer: Medicare HMO

## 2020-11-05 ENCOUNTER — Other Ambulatory Visit: Payer: Self-pay

## 2020-11-05 DIAGNOSIS — I059 Rheumatic mitral valve disease, unspecified: Secondary | ICD-10-CM

## 2020-11-05 DIAGNOSIS — Z5181 Encounter for therapeutic drug level monitoring: Secondary | ICD-10-CM

## 2020-11-05 LAB — POCT INR: INR: 3.4 — AB (ref 2.0–3.0)

## 2020-11-05 NOTE — Patient Instructions (Signed)
Description   Continue taking 1.5 tablets daily. Continue to eat 1-2 servings of green leafy veggies a week & stay consistent weekly. Recheck INR in 3 weeks. [facility] [phone].

## 2020-11-18 DIAGNOSIS — E119 Type 2 diabetes mellitus without complications: Secondary | ICD-10-CM | POA: Diagnosis not present

## 2020-11-19 ENCOUNTER — Other Ambulatory Visit: Payer: Self-pay | Admitting: Internal Medicine

## 2020-11-20 NOTE — Telephone Encounter (Signed)
Prescription refill request received for warfarin Lov:  10/01/2020 Next INR check: 8/18 Warfarin tablet strength: 5mg 

## 2020-11-25 NOTE — Progress Notes (Signed)
Remote ICD transmission.   

## 2020-11-26 ENCOUNTER — Ambulatory Visit (INDEPENDENT_AMBULATORY_CARE_PROVIDER_SITE_OTHER): Payer: Medicare HMO | Admitting: *Deleted

## 2020-11-26 ENCOUNTER — Other Ambulatory Visit: Payer: Self-pay

## 2020-11-26 DIAGNOSIS — I059 Rheumatic mitral valve disease, unspecified: Secondary | ICD-10-CM

## 2020-11-26 DIAGNOSIS — Z5181 Encounter for therapeutic drug level monitoring: Secondary | ICD-10-CM | POA: Diagnosis not present

## 2020-11-26 LAB — PROTIME-INR
INR: 6.8 (ref 0.9–1.2)
Prothrombin Time: 63.5 s — ABNORMAL HIGH (ref 9.1–12.0)

## 2020-11-26 LAB — POCT INR: INR: 6.2 — AB (ref 2.0–3.0)

## 2020-11-26 NOTE — Patient Instructions (Addendum)
Description   Spoke with pt (has already taken today's dose) do not take any Warfarin tomorrow,  No Warfarin Saturday, No Warfarin Sunday then resume taking 1.5 tablets daily. Report to ER with any bleeding, falls, accidents. If your diarrhea persists, please call us & let us know and take medications for it. Continue to eat 1-2 servings of green leafy veggies a week & stay consistent weekly. Recheck INR in 1 week. [facility] [phone].

## 2020-12-10 ENCOUNTER — Other Ambulatory Visit: Payer: Self-pay

## 2020-12-10 ENCOUNTER — Ambulatory Visit (INDEPENDENT_AMBULATORY_CARE_PROVIDER_SITE_OTHER): Payer: Medicare HMO

## 2020-12-10 DIAGNOSIS — Z5181 Encounter for therapeutic drug level monitoring: Secondary | ICD-10-CM | POA: Diagnosis not present

## 2020-12-10 DIAGNOSIS — I059 Rheumatic mitral valve disease, unspecified: Secondary | ICD-10-CM | POA: Diagnosis not present

## 2020-12-10 LAB — POCT INR: INR: 3 (ref 2.0–3.0)

## 2020-12-10 NOTE — Patient Instructions (Signed)
Description   Continue taking 1.5 tablets daily. Report to ER with any bleeding, falls, accidents. If your diarrhea persists, please call us & let us know and take medications for it. Continue to eat 1-2 servings of green leafy veggies a week & stay consistent weekly. Recheck INR in 3 weeks. [facility] [phone].

## 2020-12-18 ENCOUNTER — Telehealth: Payer: Self-pay | Admitting: Student

## 2020-12-18 NOTE — Telephone Encounter (Signed)
New Message:      Pt is just not feeling good and stays very fatigued. She wants to know if she can start back taking Entresto? Marland Kitchen She wanted to be seen, I made her an appointment on Thursday(12-24-20) with Otilio Saber.Marland Kitchen

## 2020-12-23 NOTE — Progress Notes (Signed)
Electrophysiology Office Note Date: 12/24/2020  ID:  Deborah [name], DOB May 16, 1945, MRN [id number]  PCP: Georgianne Fick, MD Primary Cardiologist: Arvilla Meres, MD Electrophysiologist: Sherryl Manges, MD   CC: Routine ICD follow-up  Deborah [name] is a 75 y.o. female seen today for Sherryl Manges, MD for acute visit due to fatigue .  Since last being seen in our clinic the patient reports fatigue over the past several months. She feels like some of it had to do with coming off of Entresto. She took a dose this am to "see if it would help". Despite being off it for months due to AKI and hyperkalemia. She had a N/V type illness about a month ago. She remains fatigue. She has SOB with moderate or more exertion. Denies chest pain, lightheadedness, or syncope.   Device History: St. Jude Single Chamber ICD implanted 2009, gen change 2018 for Chronic CHF  Past Medical History:  Diagnosis Date   AICD (automatic cardioverter/defibrillator) present    Aortic stenosis, mild    mean gradient echo 09/09   CHF (congestive heart failure) (HCC)    --Non-ischemic CM EF 30-35% by echo 9/09 (Previous 25%)  --s/p St. Jude single chamber ICD 04/09 --Minimal  CAD by cath 2004 LAD 20% LCX 20% Ramus 30% RCA nl --echo 05/11 EF 20-25%   Diabetes mellitus    type 2   Hyperlipidemia    Hypertension    Kidney stones    MR (congenital mitral regurgitation)    Severe MR s/p St. Jude mechanical MVR by Gasper Lloyd 2005   Past Surgical History:  Procedure Laterality Date   ABDOMINAL HYSTERECTOMY     For menorrhagia and.  Associated severe pain.  Sounds like she had fibroids.   APPENDECTOMY  1979   BIOPSY  07/24/2020   Procedure: BIOPSY;  Surgeon: Beverley Fiedler, MD;  Location: [facility] ENDOSCOPY;  Service: Gastroenterology;;   CARDIAC DEFIBRILLATOR PLACEMENT  2009   CARDIAC VALVE SURGERY  2005   ESOPHAGOGASTRODUODENOSCOPY N/A 12/05/2014   Procedure: ESOPHAGOGASTRODUODENOSCOPY (EGD);  Surgeon: Dorena Cookey,  MD;  Location[facility] ENDOSCOPY;  Service: Endoscopy;  Laterality: N/A;   ESOPHAGOGASTRODUODENOSCOPY (EGD) WITH PROPOFOL N/A 07/24/2020   Procedure: ESOPHAGOGASTRODUODENOSCOPY (EGD) WITH PROPOFOL;  Surgeon: Beverley Fiedler, MD;  Location: [facility] ENDOSCOPY;  Service: Gastroenterology;  Laterality: N/A;   ICD GENERATOR CHANGEOUT N/A 08/01/2016   Procedure: ICD Generator Changeout;  Surgeon: Duke Salvia, MD;  Location: [facility] INVASIVE CV LAB;  Service: Cardiovascular;  Laterality: N/A;   INSERT / REPLACE / REMOVE PACEMAKER      Current Outpatient Medications  Medication Sig Dispense Refill   atorvastatin (LIPITOR) 40 MG tablet Take 1 tablet (40 mg total) by mouth daily.     carvedilol (COREG) 12.5 MG tablet Take 1 tablet (12.5 mg total) by mouth 2 (two) times daily with a meal. 180 tablet 3   temazepam (RESTORIL) 15 MG capsule Take 15 mg by mouth at bedtime as needed for sleep.     tiZANidine (ZANAFLEX) 2 MG tablet Take 2 mg by mouth every 8 (eight) hours as needed for muscle spasms.     warfarin (COUMADIN) 5 MG tablet Take 1 and 1/2 tablets by mouth daily as directed by the coumadin clinic 150 tablet 0   HYDROcodone-acetaminophen (NORCO) 7.5-325 MG tablet Take 1 tablet by mouth 2 (two) times daily as needed for moderate pain.  (Patient not taking: Reported on 12/24/2020)     isosorbide-hydrALAZINE (BIDIL) 20-37.5 MG tablet Take 1  tablet by mouth 3 (three) times daily. (Patient not taking: Reported on 12/24/2020) 270 tablet 3   pantoprazole (PROTONIX) 40 MG tablet Take 1 tablet (40 mg total) by mouth 2 (two) times daily. (Patient not taking: Reported on 12/24/2020) 120 tablet 0   No current facility-administered medications for this visit.    Allergies:   Strawberry extract   Social History: Social History   Socioeconomic History   Marital status: Single    Spouse name: Not on file   Number of children: Not on file   Years of education: Not on file   Highest education level: Not on file  Occupational  History   Occupation: CNA    Employer: RETIRED    Comment: disabled  Tobacco Use   Smoking status: Former    Types: Cigarettes    Quit date: 06/16/1984    Years since quitting: 36.5   Smokeless tobacco: Never  Substance and Sexual Activity   Alcohol use: No   Drug use: No   Sexual activity: Not on file  Other Topics Concern   Not on file  Social History Narrative   Former smoker, started in early 20's - less than 1 ppd / Quit in her late 20's   Pt is single   Pt has one child, a son   Pt lives alone   Pt is disabled, previously worked as Lawyer x 12 years   Social Determinants of Corporate investment banker Strain: Not on [employer] Insecurity: Not on file  Transportation Needs: Not on file  Physical Activity: Not on file  Stress: Not on file  Social Connections: Not on file  Intimate Partner Violence: Not on file    Family History: Family History  Problem Relation Age of Onset   Heart disease Mother     Review of Systems: All other systems reviewed and are otherwise negative except as noted above.   Physical Exam: Vitals:   12/24/20 0819  BP: 120/80  Pulse: 81  SpO2: 96%  Weight: 153 lb (69.4 kg)  Height: 5\' 3"  (1.6 m)     GEN- The patient is well appearing, alert and oriented x 3 today.   HEENT: normocephalic, atraumatic; sclera clear, conjunctiva pink; hearing intact; oropharynx clear; neck supple, no JVP Lymph- no cervical lymphadenopathy Lungs- Clear to ausculation bilaterally, normal work of breathing.  No wheezes, rales, rhonchi Heart- Regular rate and rhythm, no murmurs, rubs or gallops, PMI not laterally displaced GI- soft, non-tender, non-distended, bowel sounds present, no hepatosplenomegaly Extremities- no clubbing or cyanosis. No edema; DP/PT/radial pulses 2+ bilaterally MS- no significant deformity or atrophy Skin- warm and dry, no rash or lesion; ICD pocket well healed Psych- euthymic mood, full affect Neuro- strength and sensation are  intact  ICD interrogation- reviewed in detail today,  See PACEART report  EKG:  EKG is ordered today. Personal review of EKG ordered today shows NSR at 81 bpm with occasional PVC  Recent Labs: 07/21/2020: ALT 13; TSH 0.272 07/22/2020: Magnesium 1.7 08/14/2020: Hemoglobin 8.7; Platelets 255 08/21/2020: BUN 27; Creatinine, Ser 1.36; Potassium 5.1; Sodium 142   Wt Readings from Last 3 Encounters:  12/24/20 153 lb (69.4 kg)  08/21/20 149 lb (67.6 kg)  08/14/20 146 lb 12.8 oz (66.6 kg)     Other studies Reviewed: Additional studies/ records that were reviewed today include: Previous EP office notes.   Assessment and Plan:  1.  Chronic systolic dysfunction s/p St. Jude single chamber ICD  Echo 07/2020 LVEF 20-25%  euvolemic today She is very confused about her medications. She is definitely taking coreg.  She has spiro but states she is not taking She does NOT have Bidil which was prescribed in May. Will re-send as she has BP room and we are unsure of current kidney function.  She took a dose of Entresto this am to see if it "would help. She has a bottle of 24/26 and 93/107 and is not sure which she took.  Normal ICD function See Arita Miss Art report No changes today  2. S/p MVR Stable by Echo 07/2020  3. HTN Stable on current regimen   4. HLD Per PCP  Current medicines are reviewed at length with the patient today.  She is not very sure of her medications. I have asked her to take them to her next HF appointment  Labs/ tests ordered today include:  Orders Placed This Encounter  Procedures   Basic metabolic panel   CBC   EKG 12-Lead   Disposition:   Follow up with Dr. Graciela Husbands in 12 months from ICD perspective. They plan to call HF clinic today for f/u. She should also follow up with PCP closely.    Dustin Flock, PA-C  12/24/2020 8:37 AM  [facility] [address] [place] [address] [phone] (office) [phone] (fax)

## 2020-12-24 ENCOUNTER — Other Ambulatory Visit: Payer: Self-pay

## 2020-12-24 ENCOUNTER — Ambulatory Visit (INDEPENDENT_AMBULATORY_CARE_PROVIDER_SITE_OTHER): Payer: Medicare HMO | Admitting: Student

## 2020-12-24 ENCOUNTER — Encounter: Payer: Self-pay | Admitting: Student

## 2020-12-24 ENCOUNTER — Other Ambulatory Visit (HOSPITAL_COMMUNITY): Payer: Self-pay

## 2020-12-24 ENCOUNTER — Other Ambulatory Visit: Payer: Self-pay | Admitting: Student

## 2020-12-24 ENCOUNTER — Telehealth: Payer: Self-pay | Admitting: Student

## 2020-12-24 VITALS — BP 120/80 | HR 81 | Ht 63.0 in | Wt 153.0 lb

## 2020-12-24 DIAGNOSIS — I059 Rheumatic mitral valve disease, unspecified: Secondary | ICD-10-CM

## 2020-12-24 DIAGNOSIS — I5022 Chronic systolic (congestive) heart failure: Secondary | ICD-10-CM | POA: Diagnosis not present

## 2020-12-24 DIAGNOSIS — I10 Essential (primary) hypertension: Secondary | ICD-10-CM | POA: Diagnosis not present

## 2020-12-24 LAB — BASIC METABOLIC PANEL
BUN/Creatinine Ratio: 22 (ref 12–28)
BUN: 29 mg/dL — ABNORMAL HIGH (ref 8–27)
CO2: 24 mmol/L (ref 20–29)
Calcium: 9.4 mg/dL (ref 8.7–10.3)
Chloride: 105 mmol/L (ref 96–106)
Creatinine, Ser: 1.3 mg/dL — ABNORMAL HIGH (ref 0.57–1.00)
Glucose: 207 mg/dL — ABNORMAL HIGH (ref 65–99)
Potassium: 5.3 mmol/L — ABNORMAL HIGH (ref 3.5–5.2)
Sodium: 143 mmol/L (ref 134–144)
eGFR: 42 mL/min/{1.73_m2} — ABNORMAL LOW (ref >59)

## 2020-12-24 LAB — CUP PACEART INCLINIC DEVICE CHECK
Battery Remaining Longevity: 58 mo
Brady Statistic RV Percent Paced: 0.32 %
Date Time Interrogation Session: 20220915084348
HighPow Impedance: 33.4744
Implantable Lead Implant Date: 20090410
Implantable Lead Location: 753860
Implantable Lead Model: 7120
Implantable Pulse Generator Implant Date: 20180423
Lead Channel Impedance Value: 362.5 Ohm
Lead Channel Pacing Threshold Amplitude: 0.5 V
Lead Channel Pacing Threshold Amplitude: 0.5 V
Lead Channel Pacing Threshold Pulse Width: 0.5 ms
Lead Channel Pacing Threshold Pulse Width: 0.5 ms
Lead Channel Sensing Intrinsic Amplitude: 11.7 mV
Lead Channel Setting Pacing Amplitude: 2.5 V
Lead Channel Setting Pacing Pulse Width: 0.5 ms
Lead Channel Setting Sensing Sensitivity: 0.5 mV
Pulse Gen Serial Number: 7423042

## 2020-12-24 LAB — CBC
Hematocrit: 37 % (ref 34.0–46.6)
Hemoglobin: 11.5 g/dL (ref 11.1–15.9)
MCH: 30.2 pg (ref 26.6–33.0)
MCHC: 31.1 g/dL — ABNORMAL LOW (ref 31.5–35.7)
MCV: 97 fL (ref 79–97)
Platelets: 245 10*3/uL (ref 150–450)
RBC: 3.81 x10E6/uL (ref 3.77–5.28)
RDW: 13.5 % (ref 11.7–15.4)
WBC: 7.7 10*3/uL (ref 3.4–10.8)

## 2020-12-24 MED ORDER — ISOSORB DINITRATE-HYDRALAZINE 20-37.5 MG PO TABS: 1.0000 | ORAL_TABLET | Freq: Three times a day (TID) | ORAL | 3 refills | Status: DC

## 2020-12-24 NOTE — Patient Instructions (Signed)
Medication Instructions:  Your physician has recommended you make the following change in your medication:   START: Bidil 1 tablet 3 times daily  *If you need a refill on your cardiac medications before your next appointment, please call your pharmacy*   Lab Work: TODAY: BMET, CBC  If you have labs (blood work) drawn today and your tests are completely normal, you will receive your results only by: MyChart Message (if you have MyChart) OR A paper copy in the mail If you have any lab test that is abnormal or we need to change your treatment, we will call you to review the results.   Follow-Up: At [facility], you and your health needs are our priority.  As part of our continuing mission to provide you with exceptional heart care, we have created designated Provider Care Teams.  These Care Teams include your primary Cardiologist (physician) and Advanced Practice Providers (APPs -  Physician Assistants and Nurse Practitioners) who all work together to provide you with the care you need, when you need it.  We recommend signing up for the patient portal called "MyChart".  Sign up information is provided on this After Visit Summary.  MyChart is used to connect with patients for Virtual Visits (Telemedicine).  Patients are able to view lab/test results, encounter notes, upcoming appointments, etc.  Non-urgent messages can be sent to your provider as well.   To learn more about what you can do with MyChart, go to [email or web address].    Your next appointment:   1 year(s)  The format for your next appointment:   In Person  Provider:   You may see Sherryl Manges, MD or one of the following Advanced Practice Providers on your designated Care Team:   Francis Dowse, [place] "[facility]" [place], [place]

## 2020-12-24 NOTE — Telephone Encounter (Signed)
Pt c/o medication issue:  1. Name of Medication: isosorbide-hydrALAZINE (BIDIL) 20-37.5 MG tablet  2. How are you currently taking this medication (dosage and times per day)? Patient not taking  3. Are you having a reaction (difficulty breathing--STAT)?   4. What is your medication issue? Cost  Patient said she is not taking this because her insurance would not cover the cost. The Doctor needs to contact the pharmacy to get it approved by her insurance

## 2020-12-24 NOTE — Telephone Encounter (Signed)
**Note De-Identified  Obfuscation** I have not received a request for the pts Bidil but I will contact [facility] as requested by the pt.

## 2020-12-25 NOTE — Telephone Encounter (Signed)
**Note De-Identified Deborah [name] Obfuscation** Deborah [name] Deborah [name]: Deborah [name] Deborah [name]: The patient currently has access to the requested medication and a Prior Authorization is not needed for the patient/medication. Drug: Deborah [name] 20-37.5MG  tablets Form [employer] Electronic PA Form [phone] NCPDP)  I have made [facility] aware that a PA is not required for the pts Deborah [name] Deborah [name] VM.

## 2020-12-28 ENCOUNTER — Other Ambulatory Visit: Payer: Self-pay | Admitting: *Deleted

## 2020-12-29 ENCOUNTER — Other Ambulatory Visit: Payer: Self-pay

## 2020-12-29 MED ORDER — HYDRALAZINE HCL 25 MG PO TABS: 25.0000 mg | ORAL_TABLET | Freq: Three times a day (TID) | ORAL | 3 refills | Status: DC

## 2020-12-29 MED ORDER — ISOSORBIDE MONONITRATE ER 30 MG PO TB24: 30.0000 mg | ORAL_TABLET | Freq: Every day | ORAL | 3 refills | Status: DC

## 2020-12-31 ENCOUNTER — Other Ambulatory Visit: Payer: Self-pay

## 2020-12-31 ENCOUNTER — Ambulatory Visit (INDEPENDENT_AMBULATORY_CARE_PROVIDER_SITE_OTHER): Payer: Medicare HMO | Admitting: *Deleted

## 2020-12-31 DIAGNOSIS — Z5181 Encounter for therapeutic drug level monitoring: Secondary | ICD-10-CM | POA: Diagnosis not present

## 2020-12-31 DIAGNOSIS — I059 Rheumatic mitral valve disease, unspecified: Secondary | ICD-10-CM | POA: Diagnosis not present

## 2020-12-31 LAB — POCT INR: INR: 3.4 — AB (ref 2.0–3.0)

## 2020-12-31 NOTE — Patient Instructions (Signed)
Description   Continue taking 1.5 tablets daily. Report to ER with any bleeding, falls, accidents. If your diarrhea persists, please call us & let us know and take medications for it. Continue to eat 1-2 servings of green leafy veggies a week & stay consistent weekly. Recheck INR in 4 weeks. Coumadin Clinic [phone].

## 2021-01-08 ENCOUNTER — Ambulatory Visit: Payer: Medicare HMO

## 2021-01-13 ENCOUNTER — Telehealth: Payer: Self-pay | Admitting: Student

## 2021-01-13 MED ORDER — WARFARIN SODIUM 5 MG PO TABS: ORAL_TABLET | ORAL | 0 refills | Status: DC

## 2021-01-13 NOTE — Addendum Note (Signed)
Addended by: Memory Dance on: 01/13/2021 10:09 AM   Modules accepted: Orders

## 2021-01-13 NOTE — Telephone Encounter (Signed)
Prescription refill request received for warfarin Lov: 12/24/20 Lanna Poche)  Next INR check: 01/2021 Warfarin tablet strength: 5mg   Appropriate dose and refill sent to requested pharmacy.

## 2021-01-13 NOTE — Telephone Encounter (Signed)
*  STAT* If patient is at the pharmacy, call can be transferred to refill team.   1. Which medications need to be refilled? (please list name of each medication and dose if known)  warfarin (COUMADIN) 5 MG tablet 1.5 tablets daily  2. Which pharmacy/location (including street and city if local pharmacy) is medication to be sent to? [id number] [place], [place] - [address]  3. Do they need a 30 day or 90 day supply? 90 with refills

## 2021-01-14 ENCOUNTER — Telehealth (HOSPITAL_COMMUNITY): Payer: Self-pay

## 2021-01-14 NOTE — Telephone Encounter (Signed)
Called and left patient a voicemail to confirm/remind patient of their appointment at the [facility] on 01/15/21 and to return a call to our office if needed.

## 2021-01-14 NOTE — Progress Notes (Unsigned)
Patient ID: [name]                 DOB: Aug 21, 1945                      MRN: [id number]     HPI: Deborah [name] is a 75 y.o. female referred by Dr. Lanna Poche to pharmacy clinic for HF medication management. PMH is significant for CHF, minimal LAD with 20% stenosis to LAD and LCX, T2DM, HTN, HLD, congential mitral regurgitation, aortic stenosis, ICD. Most recent LVEF 30-35% (improved 25%) on 12/18/20.  Patient was last seen by Dr. Lanna Poche for heart failure management where she expressed confusion about medications. She reportedly is not taking spironolactone, and she was unsure of entresto dose. She claimed to have 24/26 and 97/103 dose at home. She was asked to bring her medications in to visit with Pharm D to clarify regimen. Also, since her BP was stable, hydralazine and isosorbide mononitrate was prescribed at this visit.  Her potassium at this visit was 5.3 and patient was called and told to hold spironolactone and Entresto. She was encouraged to adhere to a low potassium diet and recheck labs in 3-4 weeks at medication reconciliation.  Today potassium is 3.8   (Today she returns to pharmacy clinic for further medication titration. At last visit with MD ***. Symptomatically, she is feeling ***, *** dizziness, lightheadedness, and fatigue. *** chest pain or palpitations. Feels SOB when ***. Able to complete all ADLs. Activity level ***. She *** checks her weight at home (normal range *** - *** lbs). *** LEE, PND, or orthopnea. Appetite has been ***. She *** adheres to a low-salt diet.)  --------------------------------------------- - Confirm her BP in office - Confirm what shes taking at home - why is she not on spiro? (Hx of Hyperkalemia?) - confirm dose of Entresto? (Hx of Hyper K+?) Was on both entresto / spiro historically - carvedilol can increase CMP today   K 3.8 Scr 1.51 GFR 35  3 weeks ago  K 5.3 Scr 1.3 GFR - 4 months ago K 5.1 Scr  1.36 GFR - 40 (Baseline  1.35)  ---------------------------------------------    Current CHF meds: carvedilol 12.5 mg, isosorbide 30 mg ER daily, hydralazine 25 mg TID, spironolactone (not taking), Entresto (unsure of dose - not taking) Previously tried: Bidil 20-37.5 mg, furosemide 40 mg, quinapril 20 mg, lisinopril 40 g BP goal: <130/80  Family History: Mother's history included heart disease  Social History: Former smoker, started in early 20's - less than 1 ppd / Quit in her late 20's  Diet:   Exercise:   Home BP readings:   Wt Readings from Last 3 Encounters:  01/15/21 149 lb 9.6 oz (67.9 kg)  12/24/20 153 lb (69.4 kg)  08/21/20 149 lb (67.6 kg)   BP Readings from Last 3 Encounters:  01/15/21 130/80  12/24/20 120/80  09/10/20 121/67   Pulse Readings from Last 3 Encounters:  01/15/21 68  12/24/20 81  09/10/20 67    Renal function: Estimated Creatinine Clearance: 28.9 mL/min (A) (by C-G formula based on SCr of 1.51 mg/dL (H)).  Past Medical History:  Diagnosis Date   AICD (automatic cardioverter/defibrillator) present    Aortic stenosis, mild    mean gradient echo 09/09   CHF (congestive heart failure) (HCC)    --Non-ischemic CM EF 30-35% by echo 9/09 (Previous 25%)  --s/p St. Jude single chamber ICD 04/09 --Minimal  CAD by cath 2004 LAD 20%  LCX 20% Ramus 30% RCA nl --echo 05/11 EF 20-25%   Diabetes mellitus    type 2   Hyperlipidemia    Hypertension    Kidney stones    MR (congenital mitral regurgitation)    Severe MR s/p St. Jude mechanical MVR by Gasper Lloyd 2005    Current Outpatient Medications on File Prior to Visit  Medication Sig Dispense Refill   atorvastatin (LIPITOR) 40 MG tablet Take 1 tablet (40 mg total) by mouth daily.     carvedilol (COREG) 12.5 MG tablet Take 1 tablet (12.5 mg total) by mouth 2 (two) times daily with a meal. 180 tablet 3   hydrALAZINE (APRESOLINE) 25 MG tablet Take 1 tablet (25 mg total) by mouth 3 (three) times daily. 270 tablet 3    HYDROcodone-acetaminophen (NORCO) 7.5-325 MG tablet Take 1 tablet by mouth 2 (two) times daily as needed for moderate pain.     isosorbide mononitrate (IMDUR) 30 MG 24 hr tablet Take 1 tablet (30 mg total) by mouth daily. 90 tablet 3   pantoprazole (PROTONIX) 40 MG tablet Take 1 tablet (40 mg total) by mouth 2 (two) times daily. (Patient taking differently: Take 40 mg by mouth 2 (two) times daily. As needed) 120 tablet 0   temazepam (RESTORIL) 15 MG capsule Take 15 mg by mouth at bedtime as needed for sleep.     tiZANidine (ZANAFLEX) 2 MG tablet Take 2 mg by mouth every 8 (eight) hours as needed for muscle spasms.     warfarin (COUMADIN) 5 MG tablet Take 1 and 1/2 tablets by mouth daily as directed by the coumadin clinic 150 tablet 0   No current facility-administered medications on file prior to visit.    Allergies  Allergen Reactions   Strawberry Extract Rash    Rash & itching.     Assessment/Plan:  1. CHF -

## 2021-01-15 ENCOUNTER — Other Ambulatory Visit: Payer: Self-pay

## 2021-01-15 ENCOUNTER — Ambulatory Visit (HOSPITAL_COMMUNITY)
Admission: RE | Admit: 2021-01-15 | Discharge: 2021-01-15 | Disposition: A | Discharge status: Home / Self Care | Payer: Medicare HMO | Source: Ambulatory Visit | Attending: Adult Health | Admitting: Adult Health

## 2021-01-15 ENCOUNTER — Ambulatory Visit: Payer: Medicare HMO

## 2021-01-15 ENCOUNTER — Encounter (HOSPITAL_COMMUNITY): Payer: Self-pay

## 2021-01-15 VITALS — BP 130/80 | HR 68 | Wt 149.6 lb

## 2021-01-15 DIAGNOSIS — I2510 Atherosclerotic heart disease of native coronary artery without angina pectoris: Secondary | ICD-10-CM | POA: Insufficient documentation

## 2021-01-15 DIAGNOSIS — Z7901 Long term (current) use of anticoagulants: Secondary | ICD-10-CM | POA: Diagnosis not present

## 2021-01-15 DIAGNOSIS — Z79899 Other long term (current) drug therapy: Secondary | ICD-10-CM | POA: Insufficient documentation

## 2021-01-15 DIAGNOSIS — Z8719 Personal history of other diseases of the digestive system: Secondary | ICD-10-CM | POA: Diagnosis not present

## 2021-01-15 DIAGNOSIS — I130 Hypertensive heart and chronic kidney disease with heart failure and stage 1 through stage 4 chronic kidney disease, or unspecified chronic kidney disease: Secondary | ICD-10-CM | POA: Insufficient documentation

## 2021-01-15 DIAGNOSIS — Z87891 Personal history of nicotine dependence: Secondary | ICD-10-CM | POA: Insufficient documentation

## 2021-01-15 DIAGNOSIS — N200 Calculus of kidney: Secondary | ICD-10-CM | POA: Insufficient documentation

## 2021-01-15 DIAGNOSIS — Z950 Presence of cardiac pacemaker: Secondary | ICD-10-CM | POA: Diagnosis not present

## 2021-01-15 DIAGNOSIS — N1832 Chronic kidney disease, stage 3b: Secondary | ICD-10-CM | POA: Diagnosis not present

## 2021-01-15 DIAGNOSIS — N183 Chronic kidney disease, stage 3 unspecified: Secondary | ICD-10-CM | POA: Diagnosis not present

## 2021-01-15 DIAGNOSIS — R630 Anorexia: Secondary | ICD-10-CM | POA: Diagnosis not present

## 2021-01-15 DIAGNOSIS — E785 Hyperlipidemia, unspecified: Secondary | ICD-10-CM | POA: Insufficient documentation

## 2021-01-15 DIAGNOSIS — I5022 Chronic systolic (congestive) heart failure: Secondary | ICD-10-CM | POA: Insufficient documentation

## 2021-01-15 DIAGNOSIS — Z87440 Personal history of urinary (tract) infections: Secondary | ICD-10-CM | POA: Diagnosis not present

## 2021-01-15 DIAGNOSIS — E1122 Type 2 diabetes mellitus with diabetic chronic kidney disease: Secondary | ICD-10-CM | POA: Diagnosis not present

## 2021-01-15 DIAGNOSIS — I059 Rheumatic mitral valve disease, unspecified: Secondary | ICD-10-CM | POA: Insufficient documentation

## 2021-01-15 DIAGNOSIS — I428 Other cardiomyopathies: Secondary | ICD-10-CM | POA: Diagnosis not present

## 2021-01-15 DIAGNOSIS — E875 Hyperkalemia: Secondary | ICD-10-CM | POA: Diagnosis not present

## 2021-01-15 DIAGNOSIS — D509 Iron deficiency anemia, unspecified: Secondary | ICD-10-CM | POA: Diagnosis not present

## 2021-01-15 DIAGNOSIS — I10 Essential (primary) hypertension: Secondary | ICD-10-CM

## 2021-01-15 DIAGNOSIS — Z952 Presence of prosthetic heart valve: Secondary | ICD-10-CM | POA: Insufficient documentation

## 2021-01-15 LAB — BRAIN NATRIURETIC PEPTIDE: B Natriuretic Peptide: 614.5 pg/mL — ABNORMAL HIGH (ref 0.0–100.0)

## 2021-01-15 LAB — CBC
HCT: 33.6 % — ABNORMAL LOW (ref 36.0–46.0)
Hemoglobin: 10.6 g/dL — ABNORMAL LOW (ref 12.0–15.0)
MCH: 31.8 pg (ref 26.0–34.0)
MCHC: 31.5 g/dL (ref 30.0–36.0)
MCV: 100.9 fL — ABNORMAL HIGH (ref 80.0–100.0)
Platelets: 206 10*3/uL (ref 150–400)
RBC: 3.33 MIL/uL — ABNORMAL LOW (ref 3.87–5.11)
RDW: 14 % (ref 11.5–15.5)
WBC: 7 10*3/uL (ref 4.0–10.5)
nRBC: 0 % (ref 0.0–0.2)

## 2021-01-15 LAB — COMPREHENSIVE METABOLIC PANEL
ALT: 39 U/L (ref 0–44)
AST: 42 U/L — ABNORMAL HIGH (ref 15–41)
Albumin: 3.4 g/dL — ABNORMAL LOW (ref 3.5–5.0)
Alkaline Phosphatase: 50 U/L (ref 38–126)
Anion gap: 8 (ref 5–15)
BUN: 32 mg/dL — ABNORMAL HIGH (ref 8–23)
CO2: 23 mmol/L (ref 22–32)
Calcium: 8.9 mg/dL (ref 8.9–10.3)
Chloride: 110 mmol/L (ref 98–111)
Creatinine, Ser: 1.51 mg/dL — ABNORMAL HIGH (ref 0.44–1.00)
GFR, Estimated: 35 mL/min — ABNORMAL LOW (ref >60)
Glucose, Bld: 75 mg/dL (ref 70–99)
Potassium: 3.8 mmol/L (ref 3.5–5.1)
Sodium: 141 mmol/L (ref 135–145)
Total Bilirubin: 1.1 mg/dL (ref 0.3–1.2)
Total Protein: 5.7 g/dL — ABNORMAL LOW (ref 6.5–8.1)

## 2021-01-15 NOTE — Progress Notes (Addendum)
Advanced [facility] Failure Clinic Note  PCP: Georgianne Fick HF Cardiologist: Arvilla Meres EP Cardiologist: Sherryl Manges  HPI:  Deborah [name] is a 75 y.o.  female  with a history of systolic CHF, nonischemic cardiomyopathy, ejection fraction 25% s/p St Jude single chamber ICD in 2009.  She also has a history of mitral valve disease and status post mitral valve replacement with a St. Jude valve in 2005.  T2DM, hypertension, hyperlipidemia, and kidney stones.    CPX performed June 2011 pVO2 16.8 slope 26.1 RER 1.11 O2 pulse 90% predicted O2 sat 87% at peak with possible hypoventilation.   05/12/11 ECHO 20-25%.  Mechanical MVR appears normal.   03/19/13 Echo 25% Mechanical MVR appears normal.   12/04/14 Echo EF 20-25% 08/10/16 EF 20% MVR ok    04/2017 Entresto was increased. Saw Dr Graciela Husbands 01/2018 and coreg was decreased due to dizziness.   Last seen in HF clinic in March 2020. Stable at that time. Echo 03/20 EF 20-25%, RV okay, mild MR.  Echo 04/22: EF 20-25%, RV okay, RVSP 46 mmHg, trivial MR, mean gradient of 4 mmHg across mechanical aortic valve.   Recent admission 07/2020 for supratherapeutic INR, H. Pylori  and upper GI bleed peptic ulcer disease, duodenitis.  Started on PPI, H. Pylori abx and given vit K and ffp to improve INR down to therapeutic range.       Seen in [facility] clinic 05/22. Cleda Daub not started and entresto held d/t increased Scr and hyperkalemia. Started on bidil and coreg decreased. Iron infusion administered d/t iron deficiency anemia noted during recent admit. Saw EP 09/22 and was not taking bidil d/t cost. Now on imdur and hydralazine.  She is here today to reestablish with HF clinic. Weight stable from last f/u in May. Denies significant dyspnea. Reports walking 1 block to catch the bus without limitation. No chest pain. Reports chronic RLE d/t prior ankle surgery. A little more edema left ankle recently. Does not wear compression stockings. Decreased appetite since admit  earlier this year. Reports a couple of years ago weight was 170s, now 149 lb.    ROS: All systems negative except as listed in HPI, PMH and Problem List.  SH:  Social History   Socioeconomic History   Marital status: Single    Spouse name: Not on file   Number of children: Not on file   Years of education: Not on file   Highest education level: Not on file  Occupational History   Occupation: CNA    Employer: RETIRED    Comment: disabled  Tobacco Use   Smoking status: Former    Types: Cigarettes    Quit date: 06/16/1984    Years since quitting: 36.6   Smokeless tobacco: Never  Substance and Sexual Activity   Alcohol use: No   Drug use: No   Sexual activity: Not on file  Other Topics Concern   Not on file  Social History Narrative   Former smoker, started in early 20's - less than 1 ppd / Quit in her late 20's   Pt is single   Pt has one child, a son   Pt lives alone   Pt is disabled, previously worked as Lawyer x 12 years   Social Determinants of Corporate investment banker Strain: Not on [employer] Insecurity: Not on file  Transportation Needs: Not on file  Physical Activity: Not on file  Stress: Not on file  Social Connections: Not on file  Intimate Partner  Violence: Not on file    FH:  Family History  Problem Relation Age of Onset   Heart disease Mother     Past Medical History:  Diagnosis Date   AICD (automatic cardioverter/defibrillator) present    Aortic stenosis, mild    mean gradient echo 09/09   CHF (congestive heart failure) (HCC)    --Non-ischemic CM EF 30-35% by echo 9/09 (Previous 25%)  --s/p St. Jude single chamber ICD 04/09 --Minimal  CAD by cath 2004 LAD 20% LCX 20% Ramus 30% RCA nl --echo 05/11 EF 20-25%   Diabetes mellitus    type 2   Hyperlipidemia    Hypertension    Kidney stones    MR (congenital mitral regurgitation)    Severe MR s/p St. Jude mechanical MVR by Gasper Lloyd 2005    Current Outpatient Medications  Medication  Sig Dispense Refill   atorvastatin (LIPITOR) 40 MG tablet Take 1 tablet (40 mg total) by mouth daily.     carvedilol (COREG) 12.5 MG tablet Take 1 tablet (12.5 mg total) by mouth 2 (two) times daily with a meal. 180 tablet 3   hydrALAZINE (APRESOLINE) 25 MG tablet Take 1 tablet (25 mg total) by mouth 3 (three) times daily. 270 tablet 3   HYDROcodone-acetaminophen (NORCO) 7.5-325 MG tablet Take 1 tablet by mouth 2 (two) times daily as needed for moderate pain.     isosorbide mononitrate (IMDUR) 30 MG 24 hr tablet Take 1 tablet (30 mg total) by mouth daily. 90 tablet 3   pantoprazole (PROTONIX) 40 MG tablet Take 1 tablet (40 mg total) by mouth 2 (two) times daily. (Patient taking differently: Take 40 mg by mouth 2 (two) times daily. As needed) 120 tablet 0   temazepam (RESTORIL) 15 MG capsule Take 15 mg by mouth at bedtime as needed for sleep.     tiZANidine (ZANAFLEX) 2 MG tablet Take 2 mg by mouth every 8 (eight) hours as needed for muscle spasms.     warfarin (COUMADIN) 5 MG tablet Take 1 and 1/2 tablets by mouth daily as directed by the coumadin clinic 150 tablet 0   No current facility-administered medications for this encounter.    Vitals:   01/15/21 1041  BP: 130/80  Pulse: 68  SpO2: 98%  Weight: 67.9 kg (149 lb 9.6 oz)     PHYSICAL EXAM: General: Elderly AAF, no acute distress Cardiac: No JVD. Regular rhythm. S1, S2. No murmurs, rubs or gallops Pulmonary: CTA Abdominal: non distended abdomen, soft and nontender Extremities: 1+ lower extremity edema Neuro: Alert, conversant, no focal deficits  ReDS 27%  ICD interrogation: No VT, CorVue impedance stable, activity averages about 2-4hrs/day  ASSESSMENT & PLAN:  Chronic Systolic Heart Failure  - nonischemic cardiomyopathy, ejection fraction 25% s/p St Jude single chamber ICD in 2009  - EF 20-25% in 11/2014 - Echo 08/2016. EF 15% - Echo 06/2018 EF 20-25%  - 07/2020 EF 20-25%, RWMA, normal functioning mechanical mitral valve.   Moderately elevated PASP - with RWMA it is possible she has developed CAD since 2009 but denies any chest discomfort - NYHA II. Volume status stable on exam. Not on loop diuretic. ReDS 27%. Has mild LE. Provided prescription for compression stockings - Continue coreg 12.5 mg BID - Continue imdur 30 mg daily and hydralazine 25 mg TID - Previously stopped entresto d/t worsening creatinine and hyperkalemia - abx for UTI earlier this year so no SGLT2i.   - CMET and BNP today. Depending on labs, may add  back ARNi.  Mitral valve disease s/p mechanical MVR in 2005 -on coumadin, coumadin clinic managing  HTN - BP okay today, enough room to titrate GDMT  Hx AKI on CKD stage III: - cr 2.35 in May, improved to 1.30 09/22. K was 5.3 - Labs today  Iron deficiency anemia: -2/2 recent upper GI blood loss in the setting of supratherapeutic INR and H. Pylori both have been treated -iron infusion in 06/22 -CBC today  Decreased appetite: -Recommend f/u with PCP for workup -Weight stable last few months but down 20 lb from 2 years ago.  -CMET today  F/u 1 month for titration of GDMT

## 2021-01-15 NOTE — Patient Instructions (Signed)
It was great to see you today! No medication changes are needed at this time.   Labs today We will only contact you if something comes back abnormal or we need to make some changes. Otherwise no news is good news!  Your physician recommends that you schedule a follow-up appointment in: 1 month  in the Advanced Practitioners (PA/NP) Clinic    Do the following things EVERYDAY: Weigh yourself in the morning before breakfast. Write it down and keep it in a log. Take your medicines as prescribed Eat low salt foods--Limit salt (sodium) to 2000 mg per day.  Stay as active as you can everyday Limit all fluids for the day to less than 2 liters

## 2021-01-15 NOTE — Progress Notes (Signed)
ReDS Vest / Clip - 01/15/21 1100       ReDS Vest / Clip   Station Marker A    Ruler Value 28    ReDS Value Range Low volume    ReDS Actual Value 27    Anatomical Comments sitting

## 2021-01-20 ENCOUNTER — Telehealth (HOSPITAL_COMMUNITY): Payer: Self-pay

## 2021-01-20 DIAGNOSIS — I5022 Chronic systolic (congestive) heart failure: Secondary | ICD-10-CM

## 2021-01-20 MED ORDER — POTASSIUM CHLORIDE CRYS ER 20 MEQ PO TBCR: 20.0000 meq | EXTENDED_RELEASE_TABLET | Freq: Every day | ORAL | 11 refills | Status: DC

## 2021-01-20 MED ORDER — FUROSEMIDE 20 MG PO TABS: 20.0000 mg | ORAL_TABLET | Freq: Every day | ORAL | 11 refills | Status: DC

## 2021-01-20 NOTE — Telephone Encounter (Signed)
-----   Message from Andrey Farmer, [place] sent at 01/15/2021  2:08 PM EDT ----- BNP up from prior baseline. May have some extra volume on board to explain some of her leg swelling. Add lasix 20 mg daily. Start K supplement 20 mEq daily. Repeat BMET in 1 week. If stable, can try to add back entresto in near future

## 2021-01-20 NOTE — Telephone Encounter (Signed)
Patient advised and verbalized understanding. Lab appt scheduled, lab order entered,med list updated to reflect changes.   Meds ordered this encounter  Medications   potassium chloride SA (KLOR-CON M20) 20 MEQ tablet    Sig: Take 1 tablet (20 mEq total) by mouth daily.    Dispense:  30 tablet    Refill:  11   furosemide (LASIX) 20 MG tablet    Sig: Take 1 tablet (20 mg total) by mouth daily.    Dispense:  30 tablet    Refill:  11   Orders Placed This Encounter  Procedures   Basic metabolic panel    Standing Status:   Future    Standing Expiration Date:   01/20/2022    Order Specific Question:   Release to patient    Answer:   Immediate

## 2021-01-21 ENCOUNTER — Ambulatory Visit: Payer: Medicare HMO

## 2021-01-25 DIAGNOSIS — D692 Other nonthrombocytopenic purpura: Secondary | ICD-10-CM | POA: Diagnosis not present

## 2021-01-25 DIAGNOSIS — D6869 Other thrombophilia: Secondary | ICD-10-CM | POA: Diagnosis not present

## 2021-01-25 DIAGNOSIS — Z23 Encounter for immunization: Secondary | ICD-10-CM | POA: Diagnosis not present

## 2021-01-25 DIAGNOSIS — I130 Hypertensive heart and chronic kidney disease with heart failure and stage 1 through stage 4 chronic kidney disease, or unspecified chronic kidney disease: Secondary | ICD-10-CM | POA: Diagnosis not present

## 2021-01-25 DIAGNOSIS — I429 Cardiomyopathy, unspecified: Secondary | ICD-10-CM | POA: Diagnosis not present

## 2021-01-25 DIAGNOSIS — I5022 Chronic systolic (congestive) heart failure: Secondary | ICD-10-CM | POA: Diagnosis not present

## 2021-01-25 DIAGNOSIS — E1165 Type 2 diabetes mellitus with hyperglycemia: Secondary | ICD-10-CM | POA: Diagnosis not present

## 2021-01-25 DIAGNOSIS — E782 Mixed hyperlipidemia: Secondary | ICD-10-CM | POA: Diagnosis not present

## 2021-01-25 DIAGNOSIS — I10 Essential (primary) hypertension: Secondary | ICD-10-CM | POA: Diagnosis not present

## 2021-01-28 ENCOUNTER — Ambulatory Visit (INDEPENDENT_AMBULATORY_CARE_PROVIDER_SITE_OTHER): Payer: Medicare HMO

## 2021-01-28 ENCOUNTER — Other Ambulatory Visit: Payer: Self-pay

## 2021-01-28 DIAGNOSIS — I059 Rheumatic mitral valve disease, unspecified: Secondary | ICD-10-CM

## 2021-01-28 DIAGNOSIS — Z5181 Encounter for therapeutic drug level monitoring: Secondary | ICD-10-CM | POA: Diagnosis not present

## 2021-01-28 LAB — PROTIME-INR
INR: 6.9 (ref 0.8–1.2)
Prothrombin Time: 59.9 seconds — ABNORMAL HIGH (ref 11.4–15.2)

## 2021-01-29 NOTE — Patient Instructions (Signed)
Description   Spoke with pt and instructed to hold Warfarin today (10/21), Saturday (10/22), and Sunday (10/23) and then resume normal dose of Warfarin 1.5 tablets daily. Recheck INR Thursday 02/04/21. Report to ER with any bleeding, falls, accidents. Continue to eat 1-2 servings of green leafy veggies a week & stay consistent weekly. Coumadin Clinic [phone].

## 2021-02-01 ENCOUNTER — Other Ambulatory Visit (HOSPITAL_COMMUNITY): Payer: Medicare HMO

## 2021-02-01 ENCOUNTER — Ambulatory Visit (INDEPENDENT_AMBULATORY_CARE_PROVIDER_SITE_OTHER): Payer: Medicare HMO

## 2021-02-01 DIAGNOSIS — I428 Other cardiomyopathies: Secondary | ICD-10-CM | POA: Diagnosis not present

## 2021-02-01 LAB — CUP PACEART REMOTE DEVICE CHECK
Battery Remaining Longevity: 55 mo
Battery Remaining Percentage: 62 %
Battery Voltage: 2.95 V
Brady Statistic RV Percent Paced: 1 %
Date Time Interrogation Session: 20221024020017
HighPow Impedance: 30 Ohm
HighPow Impedance: 30 Ohm
Implantable Lead Implant Date: 20090410
Implantable Lead Location: 753860
Implantable Lead Model: 7120
Implantable Pulse Generator Implant Date: 20180423
Lead Channel Impedance Value: 350 Ohm
Lead Channel Pacing Threshold Amplitude: 0.5 V
Lead Channel Pacing Threshold Pulse Width: 0.5 ms
Lead Channel Sensing Intrinsic Amplitude: 11.7 mV
Lead Channel Setting Pacing Amplitude: 2.5 V
Lead Channel Setting Pacing Pulse Width: 0.5 ms
Lead Channel Setting Sensing Sensitivity: 0.5 mV
Pulse Gen Serial Number: 7423042

## 2021-02-04 ENCOUNTER — Ambulatory Visit (INDEPENDENT_AMBULATORY_CARE_PROVIDER_SITE_OTHER): Payer: Medicare HMO

## 2021-02-04 ENCOUNTER — Other Ambulatory Visit: Payer: Self-pay

## 2021-02-04 DIAGNOSIS — I059 Rheumatic mitral valve disease, unspecified: Secondary | ICD-10-CM | POA: Diagnosis not present

## 2021-02-04 DIAGNOSIS — Z5181 Encounter for therapeutic drug level monitoring: Secondary | ICD-10-CM

## 2021-02-04 LAB — POCT INR: INR: 2 (ref 2.0–3.0)

## 2021-02-04 NOTE — Patient Instructions (Signed)
Description   Take an extra 1/2 tablet today, then resume same dosage of Warfarin 1.5 tablets daily. Recheck INR in 2 weeks. Continue to eat 1-2 servings of green leafy veggies a week & stay consistent weekly. Coumadin Clinic [phone].

## 2021-02-09 NOTE — Progress Notes (Signed)
Remote ICD transmission.   

## 2021-02-15 ENCOUNTER — Telehealth (HOSPITAL_COMMUNITY): Payer: Self-pay

## 2021-02-15 NOTE — Telephone Encounter (Signed)
Called and left patient a message of all the below information to confirm/remind patient of their appointment at the Advanced [facility] Failure Clinic on 02/15/21.

## 2021-02-15 NOTE — Progress Notes (Incomplete)
Advanced Heart Failure Clinic Note  PCP: Merrilee Seashore HF Cardiologist: Glori Bickers EP Cardiologist: Virl Axe  HPI:  BAYLOR RANUM is a 75 y.o.  female  with a history of systolic CHF, nonischemic cardiomyopathy, ejection fraction 25% s/p St Jude single chamber ICD in 2009.  She also has a history of mitral valve disease and status post mitral valve replacement with a St. Jude valve in 2005.  T2DM, hypertension, hyperlipidemia, and kidney stones.    CPX performed June 2011 pVO2 16.8 slope 26.1 RER 1.11 O2 pulse 90% predicted O2 sat 87% at peak with possible hypoventilation.   05/12/11 ECHO 20-25%.  Mechanical MVR appears normal.   03/19/13 Echo 25% Mechanical MVR appears normal.   12/04/14 Echo EF 20-25% 08/10/16 EF 20% MVR ok    04/2017 Entresto was increased. Saw Dr Caryl Comes 01/2018 and coreg was decreased due to dizziness.   Last seen in HF clinic in March 2020. Stable at that time. Echo 03/20 EF 20-25%, RV okay, mild MR.  Echo 04/22: EF 20-25%, RV okay, RVSP 46 mmHg, trivial MR, mean gradient of 4 mmHg across mechanical aortic valve.   Recent admission 07/2020 for supratherapeutic INR, H. Pylori  and upper GI bleed peptic ulcer disease, duodenitis.  Started on PPI, H. Pylori abx and given vit K and ffp to improve INR down to therapeutic range.       Seen in [facility] clinic 05/22. Arlyce Harman not started and entresto held d/t increased Scr and hyperkalemia. Started on bidil and coreg decreased. Iron infusion administered d/t iron deficiency anemia noted during recent admit. Saw EP 09/22 and was not taking bidil d/t cost. Now on imdur and hydralazine.  She is here today to reestablish with HF clinic. Weight stable from last f/u in May. Denies significant dyspnea. Reports walking 1 block to catch the bus without limitation. No chest pain. Reports chronic RLE d/t prior ankle surgery. A little more edema left ankle recently. Does not wear compression stockings. Decreased appetite since admit  earlier this year. Reports a couple of years ago weight was 170s, now 149 lb.    ROS: All systems negative except as listed in HPI, PMH and Problem List.  SH:  Social History   Socioeconomic History   Marital status: Single    Spouse name: Not on file   Number of children: Not on file   Years of education: Not on file   Highest education level: Not on file  Occupational History   Occupation: CNA    Employer: RETIRED    Comment: disabled  Tobacco Use   Smoking status: Former    Types: Cigarettes    Quit date: 06/16/1984    Years since quitting: 36.6   Smokeless tobacco: Never  Substance and Sexual Activity   Alcohol use: No   Drug use: No   Sexual activity: Not on file  Other Topics Concern   Not on file  Social History Narrative   Former smoker, started in early 20's - less than 1 ppd / Quit in her late 20's   Pt is single   Pt has one child, a son   Pt lives alone   Pt is disabled, previously worked as Quarry manager x 12 years   Social Determinants of Radio broadcast assistant Strain: Not on [employer] Insecurity: Not on file  Transportation Needs: Not on file  Physical Activity: Not on file  Stress: Not on file  Social Connections: Not on file  Intimate Partner  Violence: Not on file    FH:  Family History  Problem Relation Age of Onset   Heart disease Mother     Past Medical History:  Diagnosis Date   AICD (automatic cardioverter/defibrillator) present    Aortic stenosis, mild    mean gradient 51mmHg echo 09/09   CHF (congestive heart failure) ([facility])    --Non-ischemic CM EF 30-35% by echo 9/09 (Previous 25%)  --s/p St. Jude single chamber ICD 04/09 --Minimal  CAD by cath 2004 LAD 20% LCX 20% Ramus 30% RCA nl --echo 05/11 EF 20-25%   Diabetes mellitus    type 2   Hyperlipidemia    Hypertension    Kidney stones    MR (congenital mitral regurgitation)    Severe MR s/p St. Jude mechanical MVR by Gerrit Friends 2005    Current Outpatient Medications  Medication  Sig Dispense Refill   atorvastatin (LIPITOR) 40 MG tablet Take 1 tablet (40 mg total) by mouth daily.     carvedilol (COREG) 12.5 MG tablet Take 1 tablet (12.5 mg total) by mouth 2 (two) times daily with a meal. 180 tablet 3   furosemide (LASIX) 20 MG tablet Take 1 tablet (20 mg total) by mouth daily. 30 tablet 11   hydrALAZINE (APRESOLINE) 25 MG tablet Take 1 tablet (25 mg total) by mouth 3 (three) times daily. 270 tablet 3   HYDROcodone-acetaminophen (NORCO) 7.5-325 MG tablet Take 1 tablet by mouth 2 (two) times daily as needed for moderate pain.     isosorbide mononitrate (IMDUR) 30 MG 24 hr tablet Take 1 tablet (30 mg total) by mouth daily. 90 tablet 3   pantoprazole (PROTONIX) 40 MG tablet Take 1 tablet (40 mg total) by mouth 2 (two) times daily. (Patient taking differently: Take 40 mg by mouth 2 (two) times daily. As needed) 120 tablet 0   potassium chloride SA (KLOR-CON M20) 20 MEQ tablet Take 1 tablet (20 mEq total) by mouth daily. 30 tablet 11   temazepam (RESTORIL) 15 MG capsule Take 15 mg by mouth at bedtime as needed for sleep.     tiZANidine (ZANAFLEX) 2 MG tablet Take 2 mg by mouth every 8 (eight) hours as needed for muscle spasms.     warfarin (COUMADIN) 5 MG tablet Take 1 and 1/2 tablets by mouth daily as directed by the coumadin clinic 150 tablet 0   No current facility-administered medications for this visit.    There were no vitals filed for this visit.    PHYSICAL EXAM: General: Elderly AAF, no acute distress Cardiac: No JVD. Regular rhythm. S1, S2. No murmurs, rubs or gallops Pulmonary: CTA Abdominal: non distended abdomen, soft and nontender Extremities: 1+ lower extremity edema Neuro: Alert, conversant, no focal deficits  ReDS 27%  ICD interrogation: No VT, CorVue impedance stable, activity averages about 2-4hrs/day  ASSESSMENT & PLAN:  Chronic Systolic Heart Failure  - nonischemic cardiomyopathy, ejection fraction 25% s/p St Jude single chamber ICD in 2009   - EF 20-25% in 11/2014 - Echo 08/2016. EF 15% - Echo 06/2018 EF 20-25%  - 07/2020 EF 20-25%, RWMA, normal functioning mechanical mitral valve.  Moderately elevated PASP - with RWMA it is possible she has developed CAD since 2009 but denies any chest discomfort - NYHA II. Volume status stable on exam. Not on loop diuretic. ReDS 27%. Has mild LE. Provided prescription for compression stockings - Continue coreg 12.5 mg BID - Continue imdur 30 mg daily and hydralazine 25 mg TID - Previously stopped entresto d/t  worsening creatinine and hyperkalemia - abx for UTI earlier this year so no SGLT2i.   - CMET and BNP today. Depending on labs, may add back ARNi.  Mitral valve disease s/p mechanical MVR in 2005 -on coumadin, coumadin clinic managing  HTN - BP okay today, enough room to titrate GDMT  Hx AKI on CKD stage III: - cr 2.35 in May, improved to 1.30 09/22. K was 5.3 - Labs today  Iron deficiency anemia: -2/2 recent upper GI blood loss in the setting of supratherapeutic INR and H. Pylori both have been treated -iron infusion in 06/22 -CBC today  Decreased appetite: -Recommend f/u with PCP for workup -Weight stable last few months but down 20 lb from 2 years ago.  -CMET today  F/u 1 month for titration of GDMT

## 2021-02-16 ENCOUNTER — Encounter (HOSPITAL_COMMUNITY): Payer: Medicare HMO

## 2021-02-23 ENCOUNTER — Other Ambulatory Visit: Payer: Self-pay

## 2021-02-23 ENCOUNTER — Ambulatory Visit (INDEPENDENT_AMBULATORY_CARE_PROVIDER_SITE_OTHER): Payer: Medicare HMO | Admitting: *Deleted

## 2021-02-23 DIAGNOSIS — Z5181 Encounter for therapeutic drug level monitoring: Secondary | ICD-10-CM

## 2021-02-23 DIAGNOSIS — I059 Rheumatic mitral valve disease, unspecified: Secondary | ICD-10-CM | POA: Diagnosis not present

## 2021-02-23 LAB — POCT INR: INR: 4.2 — AB (ref 2.0–3.0)

## 2021-02-23 NOTE — Patient Instructions (Addendum)
Description   Do not take any Warfarin tomorrow, then continue taking Warfarin 1.5 tablets daily. Recheck INR in 2 weeks. Continue to eat 1-2 servings of green leafy veggies a week & stay consistent weekly. Coumadin Clinic [phone].

## 2021-03-08 ENCOUNTER — Telehealth: Payer: Self-pay | Admitting: Student

## 2021-03-08 NOTE — Telephone Encounter (Signed)
Spoke with pt who complains of weight loss, nausea and loss of appetite over the last 2 months or more.  Pt reports current weight is 141 pounds.  Her weight at her 12/24/2020 visit with Otilio Saber was 153 pounds.  Pt states she is actually down from her original 175 pounds.  Pt denies additional symptoms at this time.  She states her PCP visit is scheduled for 05/2020.  Encouraged pt to contact her PCP and request an earlier visit due to the weight loss.  Pt verbalizes understanding and agrees with current plan.

## 2021-03-08 NOTE — Telephone Encounter (Signed)
Pt states she have been Nauseated, Loss of Appetite and loosing weight for a few months now. She said she have a appt with her PCP but that's not til a while from now

## 2021-03-10 ENCOUNTER — Emergency Department (HOSPITAL_COMMUNITY)
Admission: EM | Admit: 2021-03-10 | Discharge: 2021-03-10 | Disposition: A | Discharge status: Home / Self Care | Payer: Medicare HMO | Attending: Emergency Medicine | Admitting: Emergency Medicine

## 2021-03-10 ENCOUNTER — Encounter (HOSPITAL_COMMUNITY): Payer: Self-pay | Admitting: Emergency Medicine

## 2021-03-10 ENCOUNTER — Other Ambulatory Visit: Payer: Self-pay

## 2021-03-10 DIAGNOSIS — T161XXA Foreign body in right ear, initial encounter: Secondary | ICD-10-CM | POA: Insufficient documentation

## 2021-03-10 DIAGNOSIS — Z79899 Other long term (current) drug therapy: Secondary | ICD-10-CM | POA: Insufficient documentation

## 2021-03-10 DIAGNOSIS — E119 Type 2 diabetes mellitus without complications: Secondary | ICD-10-CM | POA: Diagnosis not present

## 2021-03-10 DIAGNOSIS — I110 Hypertensive heart disease with heart failure: Secondary | ICD-10-CM | POA: Diagnosis not present

## 2021-03-10 DIAGNOSIS — Z7901 Long term (current) use of anticoagulants: Secondary | ICD-10-CM | POA: Insufficient documentation

## 2021-03-10 DIAGNOSIS — I5022 Chronic systolic (congestive) heart failure: Secondary | ICD-10-CM | POA: Insufficient documentation

## 2021-03-10 DIAGNOSIS — Z87891 Personal history of nicotine dependence: Secondary | ICD-10-CM | POA: Insufficient documentation

## 2021-03-10 DIAGNOSIS — X58XXXA Exposure to other specified factors, initial encounter: Secondary | ICD-10-CM | POA: Diagnosis not present

## 2021-03-10 DIAGNOSIS — J449 Chronic obstructive pulmonary disease, unspecified: Secondary | ICD-10-CM | POA: Insufficient documentation

## 2021-03-10 NOTE — ED Provider Notes (Signed)
[facility] EMERGENCY DEPARTMENT Provider Note   CSN: [id number] Arrival date & time: 03/10/21  1015     History Chief Complaint  Patient presents with   Foreign Body in Ear    Deborah [name] is a 75 y.o. female history of ICD, CHF, mitral regurg who presents for evaluation of foreign object to right ear.  Wears hearing aids at baseline.  Took these out yesterday noticed the right one was missing. She has no pain, drainage.  No headache, lightheadedness or dizziness.  Denies additional aggravating or alleviating factors.  History obtained from patient and past medical records.  No interpreter used.  HPI     Past Medical History:  Diagnosis Date   AICD (automatic cardioverter/defibrillator) present    Aortic stenosis, mild    mean gradient echo 09/09   CHF (congestive heart failure) (HCC)    --Non-ischemic CM EF 30-35% by echo 9/09 (Previous 25%)  --s/p St. Jude single chamber ICD 04/09 --Minimal  CAD by cath 2004 LAD 20% LCX 20% Ramus 30% RCA nl --echo 05/11 EF 20-25%   Diabetes mellitus    type 2   Hyperlipidemia    Hypertension    Kidney stones    MR (congenital mitral regurgitation)    Severe MR s/p St. Jude mechanical MVR by Gasper Lloyd 2005    Patient Active Problem List   Diagnosis Date Noted   Acute gastric ulcer without hemorrhage or perforation    Duodenal ulcer    Warfarin-induced coagulopathy (HCC)    Acute on chronic anemia 07/21/2020   AKI (acute kidney injury) (HCC) 07/21/2020   Lactic acidosis 07/21/2020   Acute upper GI bleed 07/20/2020   Diabetes type 2, controlled (HCC)    Chronic systolic CHF (congestive heart failure) (HCC)    Pulmonary hypertension (HCC)    History of mitral valve replacement with mechanical valve    HLD (hyperlipidemia)    Diabetes type 2, uncontrolled    Onychomycosis 01/03/2014   Encounter for therapeutic drug monitoring 07/22/2013   Valvular cardiomyopathy (HCC) 08/03/2012   ICD-St.Jude  08/02/2010   Long term (current) use of anticoagulants 07/01/2010   COPD, MILD 11/19/2009   Aortic valve disorder 07/30/2008   HYPERTENSION, BENIGN 01/24/2008   Mitral valve disorder. (Status post mechanical valve replacement) 01/24/2008   SYSTOLIC HEART FAILURE, CHRONIC 01/24/2008    Past Surgical History:  Procedure Laterality Date   ABDOMINAL HYSTERECTOMY     For menorrhagia and.  Associated severe pain.  Sounds like she had fibroids.   APPENDECTOMY  1979   BIOPSY  07/24/2020   Procedure: BIOPSY;  Surgeon: Beverley Fiedler, MD;  Location: [facility] ENDOSCOPY;  Service: Gastroenterology;;   CARDIAC DEFIBRILLATOR PLACEMENT  2009   CARDIAC VALVE SURGERY  2005   ESOPHAGOGASTRODUODENOSCOPY N/A 12/05/2014   Procedure: ESOPHAGOGASTRODUODENOSCOPY (EGD);  Surgeon: Dorena Cookey, MD;  Location: [facility] ENDOSCOPY;  Service: Endoscopy;  Laterality: N/A;   ESOPHAGOGASTRODUODENOSCOPY (EGD) WITH PROPOFOL N/A 07/24/2020   Procedure: ESOPHAGOGASTRODUODENOSCOPY (EGD) WITH PROPOFOL;  Surgeon: Beverley Fiedler, MD;  Location: [facility] ENDOSCOPY;  Service: Gastroenterology;  Laterality: N/A;   ICD GENERATOR CHANGEOUT N/A 08/01/2016   Procedure: ICD Generator Changeout;  Surgeon: Duke Salvia, MD;  Location: [facility] INVASIVE CV LAB;  Service: Cardiovascular;  Laterality: N/A;   INSERT / REPLACE / REMOVE PACEMAKER       OB History   No obstetric history on file.     Family History  Problem Relation Age of Onset   Heart  disease Mother     Social History   Tobacco Use   Smoking status: Former    Types: Cigarettes    Quit date: 06/16/1984    Years since quitting: 36.7   Smokeless tobacco: Never  Substance Use Topics   Alcohol use: No   Drug use: No    Home Medications Prior to Admission medications   Medication Sig Start Date End Date Taking? Authorizing Provider  atorvastatin (LIPITOR) 40 MG tablet Take 1 tablet (40 mg total) by mouth daily. 08/11/20   Ghimire, Werner Lean, MD  carvedilol (COREG) 12.5 MG tablet Take 1  tablet (12.5 mg total) by mouth 2 (two) times daily with a meal. 08/21/20   Winfrey, Kimberlee Nearing, MD  furosemide (LASIX) 20 MG tablet Take 1 tablet (20 mg total) by mouth daily. 01/20/21 01/20/22  Andrey Farmer, PA-C  hydrALAZINE (APRESOLINE) 25 MG tablet Take 1 tablet (25 mg total) by mouth 3 (three) times daily. 12/29/20 03/29/21  Graciella Freer, PA-C  HYDROcodone-acetaminophen (NORCO) 7.5-325 MG tablet Take 1 tablet by mouth 2 (two) times daily as needed for moderate pain. 06/18/16   [provider]  isosorbide mononitrate (IMDUR) 30 MG 24 hr tablet Take 1 tablet (30 mg total) by mouth daily. 12/29/20 03/29/21  Graciella Freer, PA-C  pantoprazole (PROTONIX) 40 MG tablet Take 1 tablet (40 mg total) by mouth 2 (two) times daily. Patient taking differently: Take 40 mg by mouth 2 (two) times daily. As needed 07/31/20   Maretta Bees, MD  potassium chloride SA (KLOR-CON M20) 20 MEQ tablet Take 1 tablet (20 mEq total) by mouth daily. 01/20/21   Andrey Farmer, PA-C  temazepam (RESTORIL) 15 MG capsule Take 15 mg by mouth at bedtime as needed for sleep. 07/11/16   [provider]  tiZANidine (ZANAFLEX) 2 MG tablet Take 2 mg by mouth every 8 (eight) hours as needed for muscle spasms.    [provider]  warfarin (COUMADIN) 5 MG tablet Take 1 and 1/2 tablets by mouth daily as directed by the coumadin clinic 01/13/21   Duke Salvia, MD    Allergies    Strawberry extract  Review of Systems   Review of Systems  Constitutional: Negative.   HENT: Negative.    Respiratory: Negative.    Cardiovascular: Negative.   Gastrointestinal: Negative.   Genitourinary: Negative.   Musculoskeletal: Negative.   Skin: Negative.   Neurological: Negative.   All other systems reviewed and are negative.  Physical Exam Updated Vital Signs BP 136/80 (BP Location: Right Arm)   Pulse 73   Temp 98.9 F (37.2 C)   Resp 16   SpO2 96%   Physical Exam Vitals and  nursing note reviewed.  Constitutional:      General: She is not in acute distress.    Appearance: She is well-developed. She is not ill-appearing, toxic-appearing or diaphoretic.  HENT:     Head: Normocephalic and atraumatic.     Right Ear: External ear normal. No drainage, swelling or tenderness. A foreign body is present.     Left Ear: Tympanic membrane, ear canal and external ear normal. No drainage, swelling or tenderness. There is no impacted cerumen.     Ears:     Comments: Wallace Cullens foreign object right ear.  Nontender external ear, no drainage Eyes:     Pupils: Pupils are equal, round, and reactive to light.  Cardiovascular:     Rate and Rhythm: Normal rate.  Pulmonary:  Effort: No respiratory distress.  Abdominal:     General: There is no distension.  Musculoskeletal:        General: Normal range of motion.     Cervical back: Normal range of motion.  Skin:    General: Skin is warm and dry.  Neurological:     General: No focal deficit present.     Mental Status: She is alert.  Psychiatric:        Mood and Affect: Mood normal.    ED Results / Procedures / Treatments   Labs (all labs ordered are listed, but only abnormal results are displayed) Labs Reviewed - No data to display  EKG None  Radiology No results found.  Procedures .Foreign Body Removal  Date/Time: 03/10/2021 1:13 PM Performed by: Linwood Dibbles, PA-C Authorized by: Linwood Dibbles, PA-C  Consent: Verbal consent obtained. Written consent not obtained. Risks and benefits: risks, benefits and alternatives were discussed Consent given by: patient Patient understanding: patient states understanding of the procedure being performed Patient consent: the patient's understanding of the procedure matches consent given Procedure consent: procedure consent matches procedure scheduled Relevant documents: relevant documents present and verified Test results: test results available and properly  labeled Site marked: the operative site was marked Imaging studies: imaging studies available Required items: required blood products, implants, devices, and special equipment available Patient identity confirmed: verbally with patient Time out: Immediately prior to procedure a "time out" was called to verify the correct patient, procedure, equipment, support staff and site/side marked as required. Body area: ear Location details: right ear  Sedation: Patient sedated: no  Patient restrained: no Patient cooperative: yes Localization method: ENT speculum Removal mechanism: alligator forceps Complexity: simple 1 objects recovered. Objects recovered: plastic end of hearing aid Post-procedure assessment: foreign body removed Patient tolerance: patient tolerated the procedure well with no immediate complications    Medications Ordered in ED Medications - No data to display  ED Course  I have reviewed the triage vital signs and the nursing notes.  Pertinent labs & imaging results that were available during my care of the patient were reviewed by me and considered in my medical decision making (see chart for details).  Here for evaluation of foreign object in right ear.  Wears hearing aids at baseline.  Removed yesterday and noted plastic flexible end was missing.  She denies any pain.  Has nonfocal exam.  Was able to remove object to right ear.  Ear visualized after procedure without any complications.  TMs clear.  Traumatic injury to canal.  DC home with symptomatic management  The patient has been appropriately medically screened and/or stabilized in the ED. I have low suspicion for any other emergent medical condition which would require further screening, evaluation or treatment in the ED or require inpatient management.  Patient is hemodynamically stable and in no acute distress.  Patient able to ambulate in department prior to ED.  Evaluation does not show acute pathology that  would require ongoing or additional emergent interventions while in the emergency department or further inpatient treatment.  I have discussed the diagnosis with the patient and answered all questions.  Pain is been managed while in the emergency department and patient has no further complaints prior to discharge.  Patient is comfortable with plan discussed in room and is stable for discharge at this time.  I have discussed strict return precautions for returning to the emergency department.  Patient was encouraged to follow-up with PCP/specialist refer to at discharge.  MDM Rules/Calculators/A&P                           Final Clinical Impression(s) / ED Diagnoses Final diagnoses:  Foreign body of right ear, initial encounter    Rx / DC Orders ED Discharge Orders     None        Kenniel Bergsma A, PA-C 03/10/21 1315    Virgina Norfolk, DO 03/10/21 1333

## 2021-03-10 NOTE — ED Triage Notes (Signed)
Pt states part of her hearing aid is stuck in R ear since last night.

## 2021-03-12 ENCOUNTER — Other Ambulatory Visit: Payer: Self-pay

## 2021-03-12 ENCOUNTER — Ambulatory Visit (INDEPENDENT_AMBULATORY_CARE_PROVIDER_SITE_OTHER): Payer: Medicare HMO

## 2021-03-12 ENCOUNTER — Encounter (INDEPENDENT_AMBULATORY_CARE_PROVIDER_SITE_OTHER): Payer: Self-pay

## 2021-03-12 DIAGNOSIS — Z5181 Encounter for therapeutic drug level monitoring: Secondary | ICD-10-CM

## 2021-03-12 DIAGNOSIS — I059 Rheumatic mitral valve disease, unspecified: Secondary | ICD-10-CM | POA: Diagnosis not present

## 2021-03-12 LAB — PROTIME-INR
INR: 7.9 (ref 0.9–1.2)
Prothrombin Time: 73.1 s — ABNORMAL HIGH (ref 9.1–12.0)

## 2021-03-12 LAB — POCT INR: INR: 6.1 — AB (ref 2.0–3.0)

## 2021-03-12 NOTE — Patient Instructions (Signed)
Description   Called and instructed pt to hold Warfarin tomorrow, Sunday, Monday, and Tuesday and then START taking 1.5 tablets daily EXCEPT 1 tablet on Mondays and Fridays.  Recheck INR in 1 week. Continue to eat 1-2 servings of green leafy veggies a week & stay consistent weekly. Coumadin Clinic [phone].

## 2021-03-19 ENCOUNTER — Other Ambulatory Visit: Payer: Self-pay

## 2021-03-19 ENCOUNTER — Ambulatory Visit: Payer: Medicare HMO

## 2021-03-19 DIAGNOSIS — I059 Rheumatic mitral valve disease, unspecified: Secondary | ICD-10-CM | POA: Diagnosis not present

## 2021-03-19 DIAGNOSIS — Z5181 Encounter for therapeutic drug level monitoring: Secondary | ICD-10-CM | POA: Diagnosis not present

## 2021-03-19 LAB — POCT INR: INR: 1.4 — AB (ref 2.0–3.0)

## 2021-03-19 NOTE — Patient Instructions (Signed)
Description   Take 1.5 tablets today and 2 tablets tomorrow and then continue taking 1.5 tablets daily EXCEPT 1 tablet on Mondays and Fridays.   Recheck INR in 1 week. Continue to eat 1-2 servings of green leafy veggies a week & stay consistent weekly. Coumadin Clinic [phone].

## 2021-03-26 ENCOUNTER — Ambulatory Visit (INDEPENDENT_AMBULATORY_CARE_PROVIDER_SITE_OTHER): Payer: Medicare HMO

## 2021-03-26 ENCOUNTER — Other Ambulatory Visit: Payer: Self-pay

## 2021-03-26 DIAGNOSIS — Z5181 Encounter for therapeutic drug level monitoring: Secondary | ICD-10-CM

## 2021-03-26 DIAGNOSIS — I059 Rheumatic mitral valve disease, unspecified: Secondary | ICD-10-CM | POA: Diagnosis not present

## 2021-03-26 LAB — POCT INR: INR: 2.8 (ref 2.0–3.0)

## 2021-03-26 NOTE — Patient Instructions (Signed)
Description   Continue on same dosage of Warfarin 1.5 tablets daily except 1 tablet on Mondays and Fridays. Recheck INR in 2 weeks. Continue to eat 1-2 servings of green leafy veggies a week & stay consistent weekly. Coumadin Clinic [phone].

## 2021-04-17 ENCOUNTER — Other Ambulatory Visit: Payer: Self-pay | Admitting: Internal Medicine

## 2021-04-19 NOTE — Telephone Encounter (Incomplete Revision)
Called pt since she is overdue for INR monitoring. She stated she is still sick and not able to come out, advised we have a warfarin refill pending for her. She is aware that we cannot fill her warfarin refill since she is overdue and this med is monitored. She confirmed she has enough warfarin and will call back soon.

## 2021-04-19 NOTE — Telephone Encounter (Addendum)
Called pt since she is overdue for INR monitoring. She stated she is still sick and not able to come out, advised we have a warfarin refill pending for her. She is aware that we cannot fill her warfarin refill since she is overdue and this med is monitored. She confirmed she has enough warfarin and will call back soon.  Pt states she will call back to r/s and has enough meds to last.

## 2021-04-19 NOTE — Telephone Encounter (Signed)
Pt is overdue for appt; will need to call before refilling.

## 2021-04-28 NOTE — Telephone Encounter (Signed)
Called pt since she is overdue for INR monitoring. Pt continues to state she is sick and doesn't feel well enough to come to any appts. I reminded her that we are unable to refill her Warfarin since she is overdue and this medication requires monitoring. Pt verbalized understanding and stated she would schedule an appt as soon as she felt better.

## 2021-05-03 ENCOUNTER — Ambulatory Visit (INDEPENDENT_AMBULATORY_CARE_PROVIDER_SITE_OTHER): Payer: Medicare Other

## 2021-05-03 DIAGNOSIS — I428 Other cardiomyopathies: Secondary | ICD-10-CM

## 2021-05-03 LAB — CUP PACEART REMOTE DEVICE CHECK
Battery Remaining Longevity: 53 mo
Battery Remaining Percentage: 59 %
Battery Voltage: 2.96 V
Brady Statistic RV Percent Paced: 1 %
Date Time Interrogation Session: 20230123020018
HighPow Impedance: 30 Ohm
HighPow Impedance: 30 Ohm
Implantable Lead Implant Date: 20090410
Implantable Lead Location: 753860
Implantable Lead Model: 7120
Implantable Pulse Generator Implant Date: 20180423
Lead Channel Impedance Value: 340 Ohm
Lead Channel Pacing Threshold Amplitude: 0.5 V
Lead Channel Pacing Threshold Pulse Width: 0.5 ms
Lead Channel Sensing Intrinsic Amplitude: 11.7 mV
Lead Channel Setting Pacing Amplitude: 2.5 V
Lead Channel Setting Pacing Pulse Width: 0.5 ms
Lead Channel Setting Sensing Sensitivity: 0.5 mV
Pulse Gen Serial Number: 7423042

## 2021-05-05 ENCOUNTER — Other Ambulatory Visit: Payer: Self-pay

## 2021-05-05 ENCOUNTER — Emergency Department (HOSPITAL_COMMUNITY): Payer: Medicare Other

## 2021-05-05 ENCOUNTER — Inpatient Hospital Stay (HOSPITAL_COMMUNITY)
Admission: EM | Admit: 2021-05-05 | Discharge: 2021-05-21 | DRG: 025 | Disposition: A | Discharge status: Hospice | Payer: Medicare Other | Attending: Internal Medicine | Admitting: Internal Medicine

## 2021-05-05 ENCOUNTER — Encounter (HOSPITAL_COMMUNITY): Payer: Self-pay

## 2021-05-05 DIAGNOSIS — R4182 Altered mental status, unspecified: Secondary | ICD-10-CM | POA: Diagnosis present

## 2021-05-05 DIAGNOSIS — E785 Hyperlipidemia, unspecified: Secondary | ICD-10-CM | POA: Diagnosis not present

## 2021-05-05 DIAGNOSIS — I6203 Nontraumatic chronic subdural hemorrhage: Secondary | ICD-10-CM | POA: Diagnosis not present

## 2021-05-05 DIAGNOSIS — W19XXXA Unspecified fall, initial encounter: Secondary | ICD-10-CM | POA: Diagnosis present

## 2021-05-05 DIAGNOSIS — Z87891 Personal history of nicotine dependence: Secondary | ICD-10-CM

## 2021-05-05 DIAGNOSIS — Z6823 Body mass index (BMI) 23.0-23.9, adult: Secondary | ICD-10-CM

## 2021-05-05 DIAGNOSIS — M852 Hyperostosis of skull: Secondary | ICD-10-CM | POA: Diagnosis not present

## 2021-05-05 DIAGNOSIS — Z79899 Other long term (current) drug therapy: Secondary | ICD-10-CM

## 2021-05-05 DIAGNOSIS — J3489 Other specified disorders of nose and nasal sinuses: Secondary | ICD-10-CM | POA: Diagnosis not present

## 2021-05-05 DIAGNOSIS — R310 Gross hematuria: Secondary | ICD-10-CM | POA: Diagnosis not present

## 2021-05-05 DIAGNOSIS — Y92019 Unspecified place in single-family (private) house as the place of occurrence of the external cause: Secondary | ICD-10-CM

## 2021-05-05 DIAGNOSIS — R300 Dysuria: Secondary | ICD-10-CM | POA: Diagnosis not present

## 2021-05-05 DIAGNOSIS — I5042 Chronic combined systolic (congestive) and diastolic (congestive) heart failure: Secondary | ICD-10-CM | POA: Diagnosis present

## 2021-05-05 DIAGNOSIS — G9341 Metabolic encephalopathy: Secondary | ICD-10-CM | POA: Diagnosis not present

## 2021-05-05 DIAGNOSIS — F0390 Unspecified dementia without behavioral disturbance: Secondary | ICD-10-CM | POA: Diagnosis present

## 2021-05-05 DIAGNOSIS — Z7401 Bed confinement status: Secondary | ICD-10-CM | POA: Diagnosis not present

## 2021-05-05 DIAGNOSIS — R509 Fever, unspecified: Secondary | ICD-10-CM | POA: Diagnosis not present

## 2021-05-05 DIAGNOSIS — Z743 Need for continuous supervision: Secondary | ICD-10-CM | POA: Diagnosis not present

## 2021-05-05 DIAGNOSIS — Z952 Presence of prosthetic heart valve: Secondary | ICD-10-CM | POA: Diagnosis not present

## 2021-05-05 DIAGNOSIS — S065X0A Traumatic subdural hemorrhage without loss of consciousness, initial encounter: Secondary | ICD-10-CM

## 2021-05-05 DIAGNOSIS — I110 Hypertensive heart disease with heart failure: Secondary | ICD-10-CM | POA: Diagnosis not present

## 2021-05-05 DIAGNOSIS — Z91018 Allergy to other foods: Secondary | ICD-10-CM

## 2021-05-05 DIAGNOSIS — N200 Calculus of kidney: Secondary | ICD-10-CM | POA: Diagnosis not present

## 2021-05-05 DIAGNOSIS — Z66 Do not resuscitate: Secondary | ICD-10-CM | POA: Diagnosis not present

## 2021-05-05 DIAGNOSIS — J449 Chronic obstructive pulmonary disease, unspecified: Secondary | ICD-10-CM | POA: Diagnosis not present

## 2021-05-05 DIAGNOSIS — Z95810 Presence of automatic (implantable) cardiac defibrillator: Secondary | ICD-10-CM

## 2021-05-05 DIAGNOSIS — R531 Weakness: Secondary | ICD-10-CM | POA: Diagnosis not present

## 2021-05-05 DIAGNOSIS — E43 Unspecified severe protein-calorie malnutrition: Secondary | ICD-10-CM | POA: Diagnosis present

## 2021-05-05 DIAGNOSIS — I5022 Chronic systolic (congestive) heart failure: Secondary | ICD-10-CM | POA: Diagnosis present

## 2021-05-05 DIAGNOSIS — N179 Acute kidney failure, unspecified: Secondary | ICD-10-CM | POA: Diagnosis present

## 2021-05-05 DIAGNOSIS — R079 Chest pain, unspecified: Secondary | ICD-10-CM | POA: Diagnosis not present

## 2021-05-05 DIAGNOSIS — Z7189 Other specified counseling: Secondary | ICD-10-CM | POA: Diagnosis not present

## 2021-05-05 DIAGNOSIS — R64 Cachexia: Secondary | ICD-10-CM | POA: Diagnosis present

## 2021-05-05 DIAGNOSIS — N029 Recurrent and persistent hematuria with unspecified morphologic changes: Secondary | ICD-10-CM | POA: Diagnosis not present

## 2021-05-05 DIAGNOSIS — M6281 Muscle weakness (generalized): Secondary | ICD-10-CM | POA: Diagnosis not present

## 2021-05-05 DIAGNOSIS — N1832 Chronic kidney disease, stage 3b: Secondary | ICD-10-CM | POA: Diagnosis present

## 2021-05-05 DIAGNOSIS — R627 Adult failure to thrive: Secondary | ICD-10-CM | POA: Diagnosis present

## 2021-05-05 DIAGNOSIS — B964 Proteus (mirabilis) (morganii) as the cause of diseases classified elsewhere: Secondary | ICD-10-CM | POA: Diagnosis present

## 2021-05-05 DIAGNOSIS — I5082 Biventricular heart failure: Secondary | ICD-10-CM | POA: Diagnosis present

## 2021-05-05 DIAGNOSIS — Z781 Physical restraint status: Secondary | ICD-10-CM

## 2021-05-05 DIAGNOSIS — R9431 Abnormal electrocardiogram [ECG] [EKG]: Secondary | ICD-10-CM | POA: Diagnosis not present

## 2021-05-05 DIAGNOSIS — Z515 Encounter for palliative care: Secondary | ICD-10-CM

## 2021-05-05 DIAGNOSIS — K5900 Constipation, unspecified: Secondary | ICD-10-CM

## 2021-05-05 DIAGNOSIS — I6200 Nontraumatic subdural hemorrhage, unspecified: Secondary | ICD-10-CM | POA: Diagnosis not present

## 2021-05-05 DIAGNOSIS — D631 Anemia in chronic kidney disease: Secondary | ICD-10-CM | POA: Diagnosis present

## 2021-05-05 DIAGNOSIS — I6201 Nontraumatic acute subdural hemorrhage: Secondary | ICD-10-CM | POA: Diagnosis not present

## 2021-05-05 DIAGNOSIS — Z8249 Family history of ischemic heart disease and other diseases of the circulatory system: Secondary | ICD-10-CM

## 2021-05-05 DIAGNOSIS — I10 Essential (primary) hypertension: Secondary | ICD-10-CM | POA: Diagnosis not present

## 2021-05-05 DIAGNOSIS — U071 COVID-19: Secondary | ICD-10-CM | POA: Diagnosis present

## 2021-05-05 DIAGNOSIS — M47812 Spondylosis without myelopathy or radiculopathy, cervical region: Secondary | ICD-10-CM | POA: Diagnosis not present

## 2021-05-05 DIAGNOSIS — I428 Other cardiomyopathies: Secondary | ICD-10-CM | POA: Diagnosis present

## 2021-05-05 DIAGNOSIS — S065XAA Traumatic subdural hemorrhage with loss of consciousness status unknown, initial encounter: Principal | ICD-10-CM | POA: Diagnosis present

## 2021-05-05 DIAGNOSIS — J90 Pleural effusion, not elsewhere classified: Secondary | ICD-10-CM | POA: Diagnosis not present

## 2021-05-05 DIAGNOSIS — Z7901 Long term (current) use of anticoagulants: Secondary | ICD-10-CM | POA: Diagnosis not present

## 2021-05-05 DIAGNOSIS — D689 Coagulation defect, unspecified: Secondary | ICD-10-CM | POA: Diagnosis present

## 2021-05-05 DIAGNOSIS — R319 Hematuria, unspecified: Secondary | ICD-10-CM

## 2021-05-05 DIAGNOSIS — I5020 Unspecified systolic (congestive) heart failure: Secondary | ICD-10-CM | POA: Diagnosis not present

## 2021-05-05 DIAGNOSIS — I059 Rheumatic mitral valve disease, unspecified: Secondary | ICD-10-CM | POA: Diagnosis present

## 2021-05-05 DIAGNOSIS — D6832 Hemorrhagic disorder due to extrinsic circulating anticoagulants: Secondary | ICD-10-CM | POA: Diagnosis present

## 2021-05-05 DIAGNOSIS — T45515A Adverse effect of anticoagulants, initial encounter: Secondary | ICD-10-CM | POA: Diagnosis not present

## 2021-05-05 DIAGNOSIS — Z8673 Personal history of transient ischemic attack (TIA), and cerebral infarction without residual deficits: Secondary | ICD-10-CM | POA: Diagnosis not present

## 2021-05-05 DIAGNOSIS — I509 Heart failure, unspecified: Secondary | ICD-10-CM | POA: Diagnosis not present

## 2021-05-05 DIAGNOSIS — N281 Cyst of kidney, acquired: Secondary | ICD-10-CM | POA: Diagnosis not present

## 2021-05-05 DIAGNOSIS — H9190 Unspecified hearing loss, unspecified ear: Secondary | ICD-10-CM | POA: Diagnosis present

## 2021-05-05 DIAGNOSIS — I130 Hypertensive heart and chronic kidney disease with heart failure and stage 1 through stage 4 chronic kidney disease, or unspecified chronic kidney disease: Secondary | ICD-10-CM | POA: Diagnosis present

## 2021-05-05 DIAGNOSIS — E1122 Type 2 diabetes mellitus with diabetic chronic kidney disease: Secondary | ICD-10-CM | POA: Diagnosis not present

## 2021-05-05 DIAGNOSIS — I517 Cardiomegaly: Secondary | ICD-10-CM | POA: Diagnosis not present

## 2021-05-05 DIAGNOSIS — R296 Repeated falls: Secondary | ICD-10-CM | POA: Diagnosis present

## 2021-05-05 DIAGNOSIS — S065X0D Traumatic subdural hemorrhage without loss of consciousness, subsequent encounter: Secondary | ICD-10-CM | POA: Diagnosis not present

## 2021-05-05 DIAGNOSIS — E876 Hypokalemia: Secondary | ICD-10-CM | POA: Diagnosis not present

## 2021-05-05 DIAGNOSIS — E440 Moderate protein-calorie malnutrition: Secondary | ICD-10-CM | POA: Insufficient documentation

## 2021-05-05 DIAGNOSIS — N390 Urinary tract infection, site not specified: Secondary | ICD-10-CM | POA: Diagnosis not present

## 2021-05-05 DIAGNOSIS — K5730 Diverticulosis of large intestine without perforation or abscess without bleeding: Secondary | ICD-10-CM | POA: Diagnosis not present

## 2021-05-05 DIAGNOSIS — I6202 Nontraumatic subacute subdural hemorrhage: Secondary | ICD-10-CM | POA: Diagnosis not present

## 2021-05-05 DIAGNOSIS — E872 Acidosis, unspecified: Secondary | ICD-10-CM | POA: Diagnosis not present

## 2021-05-05 DIAGNOSIS — D72829 Elevated white blood cell count, unspecified: Secondary | ICD-10-CM

## 2021-05-05 DIAGNOSIS — I959 Hypotension, unspecified: Secondary | ICD-10-CM | POA: Diagnosis not present

## 2021-05-05 DIAGNOSIS — E1165 Type 2 diabetes mellitus with hyperglycemia: Secondary | ICD-10-CM | POA: Diagnosis not present

## 2021-05-05 DIAGNOSIS — R404 Transient alteration of awareness: Secondary | ICD-10-CM | POA: Diagnosis not present

## 2021-05-05 DIAGNOSIS — E860 Dehydration: Secondary | ICD-10-CM | POA: Diagnosis not present

## 2021-05-05 DIAGNOSIS — N3001 Acute cystitis with hematuria: Secondary | ICD-10-CM | POA: Diagnosis not present

## 2021-05-05 HISTORY — DX: Acute gastric ulcer without hemorrhage or perforation: K25.3

## 2021-05-05 LAB — CBC WITH DIFFERENTIAL/PLATELET
Abs Immature Granulocytes: 0.03 10*3/uL (ref 0.00–0.07)
Basophils Absolute: 0 10*3/uL (ref 0.0–0.1)
Basophils Relative: 0 %
Eosinophils Absolute: 0 10*3/uL (ref 0.0–0.5)
Eosinophils Relative: 0 %
HCT: 32.1 % — ABNORMAL LOW (ref 36.0–46.0)
Hemoglobin: 10.4 g/dL — ABNORMAL LOW (ref 12.0–15.0)
Immature Granulocytes: 0 %
Lymphocytes Relative: 6 %
Lymphs Abs: 0.6 10*3/uL — ABNORMAL LOW (ref 0.7–4.0)
MCH: 32.4 pg (ref 26.0–34.0)
MCHC: 32.4 g/dL (ref 30.0–36.0)
MCV: 100 fL (ref 80.0–100.0)
Monocytes Absolute: 1 10*3/uL (ref 0.1–1.0)
Monocytes Relative: 9 %
Neutro Abs: 9.5 10*3/uL — ABNORMAL HIGH (ref 1.7–7.7)
Neutrophils Relative %: 85 %
Platelets: 302 10*3/uL (ref 150–400)
RBC: 3.21 MIL/uL — ABNORMAL LOW (ref 3.87–5.11)
RDW: 13.3 % (ref 11.5–15.5)
WBC: 11.2 10*3/uL — ABNORMAL HIGH (ref 4.0–10.5)
nRBC: 0 % (ref 0.0–0.2)

## 2021-05-05 LAB — AMMONIA: Ammonia: 25 umol/L (ref 9–35)

## 2021-05-05 LAB — CK: Total CK: 1011 U/L — ABNORMAL HIGH (ref 38–234)

## 2021-05-05 LAB — URINALYSIS, ROUTINE W REFLEX MICROSCOPIC
Bilirubin Urine: NEGATIVE
Glucose, UA: NEGATIVE mg/dL
Ketones, ur: 5 mg/dL — AB
Nitrite: NEGATIVE
Protein, ur: >=300 mg/dL — AB
Specific Gravity, Urine: 1.02 (ref 1.005–1.030)
pH: 5 (ref 5.0–8.0)

## 2021-05-05 LAB — TROPONIN I (HIGH SENSITIVITY)
Troponin I (High Sensitivity): 58 ng/L — ABNORMAL HIGH (ref <18)
Troponin I (High Sensitivity): 56 ng/L — ABNORMAL HIGH (ref <18)

## 2021-05-05 LAB — GLUCOSE, CAPILLARY
Glucose-Capillary: 114 mg/dL — ABNORMAL HIGH (ref 70–99)
Glucose-Capillary: 124 mg/dL — ABNORMAL HIGH (ref 70–99)
Glucose-Capillary: 212 mg/dL — ABNORMAL HIGH (ref 70–99)

## 2021-05-05 LAB — COMPREHENSIVE METABOLIC PANEL
ALT: 24 U/L (ref 0–44)
AST: 40 U/L (ref 15–41)
Albumin: 3.2 g/dL — ABNORMAL LOW (ref 3.5–5.0)
Alkaline Phosphatase: 65 U/L (ref 38–126)
Anion gap: 11 (ref 5–15)
BUN: 29 mg/dL — ABNORMAL HIGH (ref 8–23)
CO2: 27 mmol/L (ref 22–32)
Calcium: 8.7 mg/dL — ABNORMAL LOW (ref 8.9–10.3)
Chloride: 101 mmol/L (ref 98–111)
Creatinine, Ser: 1.39 mg/dL — ABNORMAL HIGH (ref 0.44–1.00)
GFR, Estimated: 39 mL/min — ABNORMAL LOW (ref >60)
Glucose, Bld: 181 mg/dL — ABNORMAL HIGH (ref 70–99)
Potassium: 4.2 mmol/L (ref 3.5–5.1)
Sodium: 139 mmol/L (ref 135–145)
Total Bilirubin: 1.2 mg/dL (ref 0.3–1.2)
Total Protein: 6.3 g/dL — ABNORMAL LOW (ref 6.5–8.1)

## 2021-05-05 LAB — RAPID URINE DRUG SCREEN, HOSP PERFORMED
Amphetamines: NOT DETECTED
Barbiturates: NOT DETECTED
Benzodiazepines: NOT DETECTED
Cocaine: NOT DETECTED
Opiates: POSITIVE — AB
Tetrahydrocannabinol: NOT DETECTED

## 2021-05-05 LAB — PROTIME-INR
INR: >10 (ref 0.8–1.2)
INR: 1.5 — ABNORMAL HIGH (ref 0.8–1.2)
INR: 1.3 — ABNORMAL HIGH (ref 0.8–1.2)
Prothrombin Time: >90 seconds — ABNORMAL HIGH (ref 11.4–15.2)
Prothrombin Time: 18.2 seconds — ABNORMAL HIGH (ref 11.4–15.2)
Prothrombin Time: 16 seconds — ABNORMAL HIGH (ref 11.4–15.2)

## 2021-05-05 LAB — MRSA NEXT GEN BY PCR, NASAL: MRSA by PCR Next Gen: NOT DETECTED

## 2021-05-05 LAB — RESP PANEL BY RT-PCR (FLU A&B, COVID) ARPGX2
Influenza A by PCR: NEGATIVE
Influenza B by PCR: NEGATIVE
SARS Coronavirus 2 by RT PCR: POSITIVE — AB

## 2021-05-05 LAB — LACTIC ACID, PLASMA: Lactic Acid, Venous: 2.8 mmol/L (ref 0.5–1.9)

## 2021-05-05 MED ORDER — HYDRALAZINE HCL 25 MG PO TABS
25.0000 mg | ORAL_TABLET | Freq: Three times a day (TID) | ORAL | Status: DC
  Administered 2021-05-05 – 2021-05-17 (×29): 25 mg via ORAL
  Filled 2021-05-05 (×36): qty 1

## 2021-05-05 MED ORDER — ENSURE ENLIVE PO LIQD
237.0000 mL | Freq: Two times a day (BID) | ORAL | Status: DC
  Administered 2021-05-06: 237 mL via ORAL

## 2021-05-05 MED ORDER — ONDANSETRON HCL 4 MG/2ML IJ SOLN
4.0000 mg | Freq: Four times a day (QID) | INTRAMUSCULAR | Status: DC | PRN
  Administered 2021-05-17: 4 mg via INTRAVENOUS
  Filled 2021-05-05: qty 2

## 2021-05-05 MED ORDER — ACETAMINOPHEN 325 MG PO TABS
650.0000 mg | ORAL_TABLET | ORAL | Status: DC | PRN
  Administered 2021-05-10 – 2021-05-19 (×5): 650 mg via ORAL
  Filled 2021-05-05 (×5): qty 2

## 2021-05-05 MED ORDER — SODIUM CHLORIDE 0.9 % IV BOLUS
500.0000 mL | Freq: Once | INTRAVENOUS | Status: AC
Start: 2021-05-05 — End: 2021-05-05
  Administered 2021-05-05: 14:00:00 500 mL via INTRAVENOUS

## 2021-05-05 MED ORDER — PROTHROMBIN COMPLEX CONC HUMAN 500 UNITS IV KIT
2115.0000 [IU] | PACK | Status: AC
  Administered 2021-05-05: 2115 [IU] via INTRAVENOUS
  Filled 2021-05-05: qty 2115

## 2021-05-05 MED ORDER — VITAMIN K1 10 MG/ML IJ SOLN
10.0000 mg | INTRAVENOUS | Status: AC
  Administered 2021-05-05: 16:00:00 10 mg via INTRAVENOUS
  Filled 2021-05-05: qty 1

## 2021-05-05 MED ORDER — ATORVASTATIN CALCIUM 40 MG PO TABS
40.0000 mg | ORAL_TABLET | Freq: Every day | ORAL | Status: DC
  Administered 2021-05-06 – 2021-05-19 (×14): 40 mg via ORAL
  Filled 2021-05-05 (×14): qty 1

## 2021-05-05 MED ORDER — PROTHROMBIN COMPLEX CONC HUMAN 500 UNITS IV KIT: 50.0000 [IU]/kg | PACK | Status: DC

## 2021-05-05 MED ORDER — CARVEDILOL 12.5 MG PO TABS
12.5000 mg | ORAL_TABLET | Freq: Two times a day (BID) | ORAL | Status: DC
  Administered 2021-05-06: 12.5 mg via ORAL
  Filled 2021-05-05 (×2): qty 1

## 2021-05-05 MED ORDER — SODIUM CHLORIDE 0.9 % IV SOLN: INTRAVENOUS | Status: DC

## 2021-05-05 MED ORDER — ISOSORBIDE MONONITRATE ER 30 MG PO TB24
30.0000 mg | ORAL_TABLET | Freq: Every day | ORAL | Status: DC
  Administered 2021-05-06 – 2021-05-19 (×13): 30 mg via ORAL
  Filled 2021-05-05 (×14): qty 1

## 2021-05-05 NOTE — ED Notes (Signed)
Deborah [name] [phone] would like a provider to contact to give more medical information. Notified EDP

## 2021-05-05 NOTE — Assessment & Plan Note (Signed)
Found down at home.  Denies headache, but poor insight into condition. On exam, awake and communicative but responses are slow. Difficulty following complex commands. No focal deficits but generalized weakness.  CT shows bifrontal isodense SDH and acute parafalcine SDH  Acute on chronic SDH, likely from recurrent falls.   Plan: - No clear indication for surgical intervention given the lack of focal deficits - Reverse INR to prevent extension.

## 2021-05-05 NOTE — ED Notes (Signed)
Attempted report x1. 

## 2021-05-05 NOTE — Assessment & Plan Note (Signed)
INR > 10 despite conflicting information regarding warfarin use. Patient states she continues to take but pharmacy reports she has not refilled. Suspect concurrent nutrition deficiency.  Plan: - KCentra to reverse - Vit K  - Serial INR.

## 2021-05-05 NOTE — Progress Notes (Signed)
eLink Physician-Brief Progress Note Patient Name: REBBECCA OSUNA DOB: Jun 08, 1945 MRN: [id number]   Date of Service  05/05/2021  HPI/Events of Note  Pt receiving fluids, but tolerating diet. PT tested positive for COVID  eICU Interventions  D/c IVF. Would want to avoid fluid overload state.         Levoy Geisen M DELA CRUZ 05/05/2021, 10:00 PM

## 2021-05-05 NOTE — Assessment & Plan Note (Signed)
Patient reports that she does her own cooking but cannot elaborate on details.  On examination: severe generalized loss of muscle mass and subcutaneous fat.   Plan:  - RD assessment  - Will likely need placement.

## 2021-05-05 NOTE — Assessment & Plan Note (Signed)
On examination, appears volume contracted.  Plan: - gentle hydration - hold furosemide

## 2021-05-05 NOTE — Assessment & Plan Note (Signed)
Recurrent falls at home and found down.   Plan:  - PT/OT - Home safety assessment - Will likely need placement.

## 2021-05-05 NOTE — Assessment & Plan Note (Signed)
Normal S1 without murmur.  No signs of valve dysfunction  Plan: - Well endothelialized valve at this point, low per day risk of thrombosis. Can safely interrupt until SDH stability can be assessed.

## 2021-05-05 NOTE — ED Triage Notes (Signed)
Pt arrived via [facility] from home. Pt's sister tried to call pt yesterday, but pt didn't answer phone, sister went to pt's house and found pt on bathroom floor. Per EMS, pt stated she was cleaning floor and couldn't get back up. Pt denied falling. Per EMS, pt's sister states pt has had generalized weakness for 2-3 mos and it's getting progressively worse and pt is slow to answer questions and respond. Per EMS pt is A&Ox4.

## 2021-05-05 NOTE — H&P (Signed)
NAME:  Deborah [name], MRN:  [id number], DOB:  1945-12-08, LOS: 0 ADMISSION DATE:  05/05/2021, CONSULTATION DATE:  05/05/2021 REFERRING MD:  Dr. 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 on coumadin, HLD, DM, and anemia who presented to [facility] ER by EMS with altered mental status and weakness.  Patient lives alone.  Her sister was unable to reach her yesterday or today.  They went to check on her and she was found her on the bathroom floor.  EMS report notes that she stated she had lost control of her bladder and sat on the floor to clean her self but was too weak to get up.  Denied falling, dizziness, syncope, etc.  Also reports not eating/ drinking for 16 hours.  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 on coumadin, HLD, DM, anemia, prior GI bleed   Significant Hospital Events: Including procedures, antibiotic start and stop dates in addition to other pertinent events   1/25 Admitted, NSGY consulted, reversed with Eppie Gibson  Interim History / Subjective:  Finished Kcentra   Objective   Blood pressure 128/86, pulse 80, temperature 97.8 F (36.6 C), temperature source Oral, resp. rate 16, height 5\' 3"  (1.6 m), weight 67.9 kg, SpO2 93 %.       No intake or output data in the 24 hours ending 05/05/21 1614 Filed Weights   05/05/21 1241  Weight: 67.9 kg    Examination: General: Cachectic, chronically ill appearing elderly F, reclined in bed NAD HENT: NCAT. Missing teeth. Pink mm Lungs: CTAb even unlabored on RA  Cardiovascular: mitral click. Rr. Cap refill brisk. Visible subcutaneous Bumps from sternotomy wires  Abdomen: soft thin ndnt + bowel sounds  Extremities: no acute joint deformity no cyanosis or clubbing  Neuro: Awake, lethargic. Following  commands. Pupils 4mm ERRLA  GU: defer  Resolved Hospital Problem list     Assessment & Plan:   Bilateral SDH -- acute vs subacute, complicated by severe coagulopathy  -seen by NSGY, no surgical indication at this time  P -NSGY following  -Kcentra  -admit to ICU  -q1hr neuro exam   S/p mechanical mitral valve Coagulopathy, severe -? If taking coumadin incorrectly or if nutrition state is contributing? P -s/p Kcentra -trend INR   HFrEF HTN HLD P -holding home meds for now --need a med rec to figure out what she is actually taking   Elevated CK CKD vs AKI -unknown how long pt was down on the ground P -gentle IVF  Protein calorie malnutrition  P -NPO for now, but if stays stable, RDN consult   GOC / Dispo -pending acute course, may need to consider supported living situation for pt -- in review of records, sounds like there may be a functional decline over the past year(s). Worry about management of home meds. Worry about PO intake.   Best Practice (right click and "Reselect all SmartList Selections" daily)   Diet/type: NPO DVT prophylaxis: other GI prophylaxis: N/A Lines: N/A Foley:  N/A Code Status:  full code Last date of multidisciplinary goals of care discussion [--]  Labs   CBC: Recent Labs  Lab 05/05/21 1300  WBC 11.2*  NEUTROABS 9.5*  HGB 10.4*  HCT 32.1*  MCV 100.0  PLT 302  Basic Metabolic Panel: Recent Labs  Lab 05/05/21 1300  NA 139  K 4.2  CL 101  CO2 27  GLUCOSE 181*  BUN 29*  CREATININE 1.39*  CALCIUM 8.7*   GFR: Estimated Creatinine Clearance: 31.4 mL/min (A) (by C-G formula based on SCr of 1.39 mg/dL (H)). Recent Labs  Lab 05/05/21 1300  WBC 11.2*  LATICACIDVEN 2.8*    Liver Function Tests: Recent Labs  Lab 05/05/21 1300  AST 40  ALT 24  ALKPHOS 65  BILITOT 1.2  PROT 6.3*  ALBUMIN 3.2*   No results for input(s): LIPASE, AMYLASE in the last 168 hours. Recent Labs  Lab 05/05/21 1300  AMMONIA 25     ABG    Component Value Date/Time   HCO3 28.2 (H) 12/07/2006 1925   TCO2 28 05/10/2011 1420     Coagulation Profile: Recent Labs  Lab 05/05/21 1320  INR >10.0*    Cardiac Enzymes: Recent Labs  Lab 05/05/21 1300  CKTOTAL 1,011*    HbA1C: Hgb A1c MFr Bld  Date/Time Value Ref Range Status  12/04/2014 12:12 PM 5.9 (H) 4.8 - 5.6 % Final    Comment:    (NOTE)         Pre-diabetes: 5.7 - 6.4         Diabetes: >6.4         Glycemic control for adults with diabetes: <7.0   12/08/2006 04:10 AM   Final   6.1 (NOTE)   The ADA recommends the following therapeutic goals for glycemic   control related to Hgb A1C measurement:   Goal of Therapy:   < 7.0% Hgb A1C   Action Suggested:  > 8.0% Hgb A1C   Ref:  Diabetes Care, 22, Suppl. 1, 1999    CBG: No results for input(s): GLUCAP in the last 168 hours.  Review of Systems:   Review of Systems  Constitutional: Negative.   HENT: Negative.    Eyes: Negative.   Respiratory: Negative.    Cardiovascular: Negative.   Gastrointestinal: Negative.   Genitourinary: Negative.   Musculoskeletal:  Positive for myalgias.  Skin: Negative.   Neurological:  Negative for dizziness, tingling, speech change, focal weakness, weakness and headaches.  Endo/Heme/Allergies:  Bruises/bleeds easily.  Psychiatric/Behavioral: Negative.      Past Medical History:  She,  has a past medical history of AICD (automatic cardioverter/defibrillator) present, Aortic stenosis, mild, CHF (congestive heart failure) ([place]), Diabetes mellitus, Hyperlipidemia, Hypertension, Kidney stones, and MR (congenital mitral regurgitation).   Surgical History:   Past Surgical History:  Procedure Laterality Date   ABDOMINAL HYSTERECTOMY     For menorrhagia and.  Associated severe pain.  Sounds like she had fibroids.   APPENDECTOMY  1979   BIOPSY  07/24/2020   Procedure: BIOPSY;  Surgeon: Jerene Bears, MD;  Location: [facility] ENDOSCOPY;  Service: Gastroenterology;;   CARDIAC  DEFIBRILLATOR PLACEMENT  2009   CARDIAC VALVE SURGERY  2005   ESOPHAGOGASTRODUODENOSCOPY N/A 12/05/2014   Procedure: ESOPHAGOGASTRODUODENOSCOPY (EGD);  Surgeon: Teena Irani, MD;  Location: [facility] ENDOSCOPY;  Service: Endoscopy;  Laterality: N/A;   ESOPHAGOGASTRODUODENOSCOPY (EGD) WITH PROPOFOL N/A 07/24/2020   Procedure: ESOPHAGOGASTRODUODENOSCOPY (EGD) WITH PROPOFOL;  Surgeon: Jerene Bears, MD;  Location: [facility] ENDOSCOPY;  Service: Gastroenterology;  Laterality: N/A;   ICD GENERATOR CHANGEOUT N/A 08/01/2016   Procedure: ICD Generator Changeout;  Surgeon: Deboraha Sprang, MD;  Location: [place] [facility] LAB;  Service: Cardiovascular;  Laterality: N/A;   INSERT / REPLACE / REMOVE PACEMAKER  Social History:   reports that she quit smoking about 36 years ago. Her smoking use included cigarettes. She has never used smokeless tobacco. She reports that she does not drink alcohol and does not use drugs.   Family History:  Her family history includes Heart disease in her mother.   Allergies Allergies  Allergen Reactions   Strawberry Extract Rash    Rash & itching.     Home Medications  Prior to Admission medications   Medication Sig Start Date End Date Taking? Authorizing Provider  atorvastatin (LIPITOR) 40 MG tablet Take 1 tablet (40 mg total) by mouth daily. 08/11/20   Ghimire, Henreitta Leber, MD  carvedilol (COREG) 12.5 MG tablet Take 1 tablet (12.5 mg total) by mouth 2 (two) times daily with a meal. 08/21/20   Winfrey, Jenne Pane, MD  furosemide (LASIX) 20 MG tablet Take 1 tablet (20 mg total) by mouth daily. 01/20/21 01/20/22  Joette Catching, PA-C  hydrALAZINE (APRESOLINE) 25 MG tablet Take 1 tablet (25 mg total) by mouth 3 (three) times daily. 12/29/20 03/29/21  Shirley Friar, PA-C  HYDROcodone-acetaminophen (NORCO) 7.5-325 MG tablet Take 1 tablet by mouth 2 (two) times daily as needed for moderate pain. 06/18/16   [provider]  isosorbide mononitrate (IMDUR) 30 MG 24 hr  tablet Take 1 tablet (30 mg total) by mouth daily. 12/29/20 03/29/21  Shirley Friar, PA-C  pantoprazole (PROTONIX) 40 MG tablet Take 1 tablet (40 mg total) by mouth 2 (two) times daily. Patient taking differently: Take 40 mg by mouth 2 (two) times daily. As needed 07/31/20   Jonetta Osgood, MD  potassium chloride SA (KLOR-CON M20) 20 MEQ tablet Take 1 tablet (20 mEq total) by mouth daily. 01/20/21   Joette Catching, PA-C  temazepam (RESTORIL) 15 MG capsule Take 15 mg by mouth at bedtime as needed for sleep. 07/11/16   [provider]  tiZANidine (ZANAFLEX) 2 MG tablet Take 2 mg by mouth every 8 (eight) hours as needed for muscle spasms.    [provider]  warfarin (COUMADIN) 5 MG tablet Take 1 and 1/2 tablets by mouth daily as directed by the coumadin clinic 01/13/21   Deboraha Sprang, MD     Critical care time: 45 minutes        CRITICAL CARE Performed by: Cristal Generous   Total critical care time: 45 minutes  Critical care time was exclusive of separately billable procedures and treating other patients. Critical care was necessary to treat or prevent imminent or life-threatening deterioration.  Critical care was time spent personally by me on the following activities: development of treatment plan with patient and/or surrogate as well as nursing, discussions with consultants, evaluation of patient's response to treatment, examination of patient, obtaining history from patient or surrogate, ordering and performing treatments and interventions, ordering and review of laboratory studies, ordering and review of radiographic studies, pulse oximetry and re-evaluation of patient's condition.  Eliseo Gum MSN, AGACNP-BC [place] for pager  05/05/2021, 5:06 PM

## 2021-05-05 NOTE — ED Provider Notes (Addendum)
[facility] EMERGENCY DEPARTMENT Provider Note   CSN: [id number] Arrival date & time: 05/05/21  1224     History  Chief Complaint  Patient presents with   Weakness    Deborah [name] is a 76 y.o. female.  With a past medical history of chronic anemia history of GI bleed, type 2 diabetes, hyperlipidemia, valvular cardiomyopathy on chronic Coumadin who presents with altered mental status and weakness.  EMS reports that they were called out for wellness check, patient was found on the floor.  The patient does not remember what happened or why she is here.  She is alert only to person and place.  Additional history obtained from patient's sister, Deborah [name] by telephone.  She states that she and her other sister have been unable to reach the patient by phone all day yesterday so they called to have a well check.  She has had progressively worsening weakness and weight loss over the past several weeks.   Weakness     Home Medications Prior to Admission medications   Medication Sig Start Date End Date Taking? Authorizing Provider  atorvastatin (LIPITOR) 40 MG tablet Take 1 tablet (40 mg total) by mouth daily. 08/11/20   Ghimire, Werner Lean, MD  carvedilol (COREG) 12.5 MG tablet Take 1 tablet (12.5 mg total) by mouth 2 (two) times daily with a meal. 08/21/20   Winfrey, Kimberlee Nearing, MD  furosemide (LASIX) 20 MG tablet Take 1 tablet (20 mg total) by mouth daily. 01/20/21 01/20/22  Andrey Farmer, PA-C  hydrALAZINE (APRESOLINE) 25 MG tablet Take 1 tablet (25 mg total) by mouth 3 (three) times daily. 12/29/20 03/29/21  Graciella Freer, PA-C  HYDROcodone-acetaminophen (NORCO) 7.5-325 MG tablet Take 1 tablet by mouth 2 (two) times daily as needed for moderate pain. 06/18/16   [provider]  isosorbide mononitrate (IMDUR) 30 MG 24 hr tablet Take 1 tablet (30 mg total) by mouth daily. 12/29/20 03/29/21  Graciella Freer, PA-C  pantoprazole (PROTONIX) 40 MG  tablet Take 1 tablet (40 mg total) by mouth 2 (two) times daily. Patient taking differently: Take 40 mg by mouth 2 (two) times daily. As needed 07/31/20   Maretta Bees, MD  potassium chloride SA (KLOR-CON M20) 20 MEQ tablet Take 1 tablet (20 mEq total) by mouth daily. 01/20/21   Andrey Farmer, PA-C  temazepam (RESTORIL) 15 MG capsule Take 15 mg by mouth at bedtime as needed for sleep. 07/11/16   [provider]  tiZANidine (ZANAFLEX) 2 MG tablet Take 2 mg by mouth every 8 (eight) hours as needed for muscle spasms.    [provider]  warfarin (COUMADIN) 5 MG tablet Take 1 and 1/2 tablets by mouth daily as directed by the coumadin clinic 01/13/21   Duke Salvia, MD      Allergies    Strawberry extract    Review of Systems   Review of Systems  Neurological:  Positive for weakness.   Physical Exam Updated Vital Signs BP 124/64    Pulse (!) 111    Temp 97.8 F (36.6 C) (Oral)    Resp 16    Ht 5\' 3"  (1.6 m)    Wt 67.9 kg    SpO2 96%    BMI 26.52 kg/m  Physical Exam Constitutional:      Appearance: She is underweight. She is ill-appearing.  HENT:     Head: Normocephalic and atraumatic.  Eyes:     Extraocular Movements: Extraocular movements  intact.     Conjunctiva/sclera: Conjunctivae normal.     Pupils: Pupils are equal, round, and reactive to light.  Cardiovascular:     Rate and Rhythm: Tachycardia present.     Pulses: Normal pulses.  Pulmonary:     Effort: Pulmonary effort is normal.     Breath sounds: Normal breath sounds.  Abdominal:     General: Abdomen is flat. There is no distension.     Tenderness: There is no abdominal tenderness.  Musculoskeletal:        General: No deformity.     Cervical back: Normal range of motion and neck supple.  Skin:    General: Skin is warm and dry.  Neurological:     Mental Status: She is lethargic, disoriented and confused.     GCS: GCS eye subscore is 2. GCS verbal subscore is 4. GCS motor subscore is 6.      Cranial Nerves: No cranial nerve deficit.     Sensory: No sensory deficit.     Motor: No weakness.    ED Results / Procedures / Treatments   Labs (all labs ordered are listed, but only abnormal results are displayed) Labs Reviewed  CULTURE, BLOOD (ROUTINE X 2)  CULTURE, BLOOD (ROUTINE X 2)  COMPREHENSIVE METABOLIC PANEL  CBC WITH DIFFERENTIAL/PLATELET  URINALYSIS, ROUTINE W REFLEX MICROSCOPIC  AMMONIA  LACTIC ACID, PLASMA  RAPID URINE DRUG SCREEN, HOSP PERFORMED  CK  CBG MONITORING, ED  TROPONIN I (HIGH SENSITIVITY)    EKG None  Radiology No results found.  Procedures .Critical Care Performed by: Arthor Captain, PA-C Authorized by: Arthor Captain, PA-C   Critical care provider statement:    Critical care time (minutes):  60   Critical care was necessary to treat or prevent imminent or life-threatening deterioration of the following conditions:  CNS failure or compromise   Critical care was time spent personally by me on the following activities:  Development of treatment plan with patient or surrogate, discussions with consultants, evaluation of patient's response to treatment, examination of patient, ordering and review of laboratory studies, ordering and review of radiographic studies, ordering and performing treatments and interventions, pulse oximetry, re-evaluation of patient's condition and review of old charts    Medications Ordered in ED Medications  sodium chloride 0.9 % bolus 500 mL (has no administration in time range)    ED Course/ Medical Decision Making/ A&P Clinical Course as of 05/06/21 1013  Wed May 05, 2021  1412 Troponin I (High Sensitivity)(!): 58 [AH]  1412 CK Total(!): 1,011 [AH]  1413 Spoke with LAb INR >10- vitamin K, Kcentra ordered   [AH]  1417 Troponin I (High Sensitivity)(!): 58 [AH]  1418 CT HEAD WO CONTRAST Discussed radiologic findings by phone with radiologist.  Patient is noted to have bilateral subdurals with mass-effect, I  have ordered a consult with neurosurgery [AH]  1420 CT CERVICAL SPINE WO CONTRAST Independent interpretation: no acute findings [AH]  1438 INR(!!): >10.0 [AH]  1438 Comprehensive metabolic panel(!) [AH]  1438 Creatinine(!): 1.39 Creatine at baseline  [AH]  1559 Case  discussed with patient's other sister, Tajuanna Burnett.  She states that she is a 1 who found the patient on the floor today [AH]    Clinical Course User Index [AH] Arthor Captain, PA-C                           Medical Decision Making This patient presents to the ED for concern of  ams, this involves an extensive number of treatment options, and is a complaint that carries with it a high risk of complications and morbidity.  The differential diagnosis includes The differential diagnosis for AMS is extensive but for this patient I have concern for stroke, encephalopathy, dehydration, infection/sepsis, rhabdomyolysis, acs,cancer or brain tumor,  kidney failure. Low suspicion for hypercapnia   Cardiac Monitoring:       The patient was maintained on a cardiac monitor.  I personally viewed and interpreted the cardiac monitored which showed an underlying rhythm of: sinus rhythm-rate  of 82   Medicines ordered and prescription drug management: Kcentra and vitamin K for life threatening bleed  Reevaluation shows the the patient is maintaining her current level of consciousness  I have reviewed the patients home medicines and have made adjustments as needed   Test Considered:       ABG- to rule out hypercapnia/ acidosis, however patient in no reps distress and no hx of resp illness   Critical Interventions:       reversal of anticoagulation  Reevaluation:  After the interventions noted above, I reevaluated the patient and found that they have :stayed the same  Dispostion:  After consideration of the diagnostic results and the patients response to treatment, I feel that the patent would benefit from admission to  ICU.    Problems Addressed: Subdural hematoma: complicated acute illness or injury that poses a threat to life or bodily functions  Amount and/or Complexity of Data Reviewed Independent Historian:     Details: both sisters Labs: ordered. Decision-making details documented in ED Course. Radiology: ordered and independent interpretation performed. Decision-making details documented in ED Course. ECG/medicine tests: ordered.    Details: sinus rhythm at a rate of 71 Discussion of management or test interpretation with external provider(s): Neurosurgery who recommend medical mgmt and ICU admission PCCM who will admit the patient  Risk Decision regarding hospitalization. Diagnosis or treatment significantly limited by social determinants of health. Risk Details: Anticoagulated female with supratherapeutic INR Lives alone, FTT Familial support limited  Critical Care Total time providing critical care: 30-74 minutes  Final Clinical Impression(s) / ED Diagnoses Final diagnoses:  None    Rx / DC Orders ED Discharge Orders     None         Arthor Captain, PA-C 05/06/21 1011    Arthor Captain, PA-C 05/06/21 1013    Pollyann Savoy, MD 05/06/21 1512

## 2021-05-05 NOTE — Assessment & Plan Note (Signed)
Denies dyspnea. On exam, no JVD, mechanical S1  Plan:  - continue home HF meds.

## 2021-05-05 NOTE — Consult Note (Signed)
Reason for Consult:SDH Referring Physician: EDP  SAWYER HARGADON is an 76 y.o. female.   HPI:  76 year old female presented to the ED today after being found down. According to family no one was able to contact her since yesterday. She was found down in her bathroom. She is a very poor historian and a little confused today. She is also very hard of hearing. She reports that she does take coumadin. Denies any HA NV dizziness or visions changes. Has a history of mechanical mitral heart valve and CHF and diabetes. She is amnestic to the event and does not recall falling. 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  Past Medical History:  Diagnosis Date   AICD (automatic cardioverter/defibrillator) present    Aortic stenosis, mild    mean gradient 42mmHg echo 09/09   CHF (congestive heart failure) ([place])    --Non-ischemic CM EF 30-35% by echo 9/09 (Previous 25%)  --s/p St. Jude single chamber ICD 04/09 --Minimal  CAD by cath 2004 LAD 20% LCX 20% Ramus 30% RCA nl --echo 05/11 EF 20-25%   Diabetes mellitus    type 2   Hyperlipidemia    Hypertension    Kidney stones    MR (congenital mitral regurgitation)    Severe MR s/p St. Jude mechanical MVR by Gerrit Friends 2005    Past Surgical History:  Procedure Laterality Date   ABDOMINAL HYSTERECTOMY     For menorrhagia and.  Associated severe pain.  Sounds like she had fibroids.   APPENDECTOMY  1979   BIOPSY  07/24/2020   Procedure: BIOPSY;  Surgeon: Jerene Bears, MD;  Location: [facility] ENDOSCOPY;  Service: Gastroenterology;;   CARDIAC DEFIBRILLATOR PLACEMENT  2009   CARDIAC VALVE SURGERY  2005   ESOPHAGOGASTRODUODENOSCOPY N/A 12/05/2014   Procedure: ESOPHAGOGASTRODUODENOSCOPY (EGD);  Surgeon: Teena Irani, MD;  Location: [facility] ENDOSCOPY;  Service: Endoscopy;  Laterality: N/A;    ESOPHAGOGASTRODUODENOSCOPY (EGD) WITH PROPOFOL N/A 07/24/2020   Procedure: ESOPHAGOGASTRODUODENOSCOPY (EGD) WITH PROPOFOL;  Surgeon: Jerene Bears, MD;  Location: [facility] ENDOSCOPY;  Service: Gastroenterology;  Laterality: N/A;   ICD GENERATOR CHANGEOUT N/A 08/01/2016   Procedure: ICD Generator Changeout;  Surgeon: Deboraha Sprang, MD;  Location: [facility];  Service: Cardiovascular;  Laterality: N/A;   INSERT / REPLACE / REMOVE PACEMAKER      Allergies  Allergen Reactions   Strawberry Extract Rash    Rash & itching.    Social History   Tobacco Use   Smoking status: Former    Types: Cigarettes    Quit date: 06/16/1984    Years since quitting: 36.9   Smokeless tobacco: Never  Substance Use Topics   Alcohol use: No    Family History  Problem Relation Age of Onset   Heart disease Mother      Review of Systems  Positive ROS: as above  All other systems have been reviewed and were otherwise negative with the exception of those mentioned in the HPI and as above.  Objective: Vital signs in last 24 hours: Temp:  [97.8 F (36.6 C)] 97.8 F (36.6 C) (01/25 1236) Pulse Rate:  [80-111] 80 (01/25 1444) Resp:  [16] 16 (01/25 1444) BP: (124-128)/(64-86) 128/86 (01/25 1444) SpO2:  [93 %-96 %] 93 % (01/25 1444) Weight:  [67.9 kg] 67.9 kg (01/25 1241)  General Appearance: Alert, cooperative, no distress, appears stated age Head: Normocephalic, without obvious abnormality, atraumatic Eyes: PERRL, conjunctiva/corneas clear, EOM's intact, fundi benign, both eyes      Lungs: respirations unlabored Heart: Regular rate and rhythm Pulses: 2+ and symmetric all extremities Skin: Skin color, texture, turgor normal, no rashes or lesions  NEUROLOGIC:   Mental status: A&O x3, no aphasia, good attention span, impaired memory and fund of knowledge Motor Exam - grossly normal, normal tone and bulk Sensory Exam - grossly normal Reflexes: symmetric, no pathologic reflexes, No Hoffman's, No  clonus Coordination - not tested Gait - not tested Balance - not tested Cranial Nerves: I: smell Not tested  II: visual acuity  OS: na    OD: na  II: visual fields Full to confrontation  II: pupils Equal, round, reactive to light  III,VII: ptosis None  III,IV,VI: extraocular muscles  Full ROM  V: mastication Normal  V: facial light touch sensation  Normal  V,VII: corneal reflex  Present  VII: facial muscle function - upper  Normal  VII: facial muscle function - lower Normal  VIII: hearing Not tested  IX: soft palate elevation  Normal  IX,X: gag reflex Present  XI: trapezius strength  5/5  XI: sternocleidomastoid strength 5/5  XI: neck flexion strength  5/5  XII: tongue strength  Normal    Data Review Lab Results  Component Value Date   WBC 11.2 (H) 05/05/2021   HGB 10.4 (L) 05/05/2021   HCT 32.1 (L) 05/05/2021   MCV 100.0 05/05/2021   PLT 302 05/05/2021   Lab Results  Component Value Date   NA 139 05/05/2021   K 4.2 05/05/2021   CL 101 05/05/2021   CO2 27 05/05/2021   BUN 29 (H) 05/05/2021   CREATININE 1.39 (H) 05/05/2021   GLUCOSE 181 (H) 05/05/2021   Lab Results  Component Value Date   INR >10.0 (HH) 05/05/2021    Radiology: DG Chest 2 View  Result Date: 05/05/2021 CLINICAL DATA:  Altered mental status, weakness. EXAM: CHEST - 2 VIEW COMPARISON:  July 20, 2020 FINDINGS: Left chest AICD with lead projecting over the right ventricle. Similar cardiomegaly. Prior median sternotomy with unchanged fracture of the superior most wire. No focal airspace consolidation. No visible pleural effusion or pneumothorax. Thoracic spondylosis with degenerative changes shoulders. IMPRESSION: Unchanged cardiomegaly without overt pulmonary edema or focal airspace consolidation. Electronically Signed   By: Dahlia Bailiff M.D.   On: 05/05/2021 13:48   CT HEAD WO CONTRAST  Result Date: 05/05/2021 CLINICAL DATA:  Found down. Altered mental status. Generalized weakness for 2-3 months.  EXAM: CT HEAD WITHOUT CONTRAST CT CERVICAL SPINE WITHOUT CONTRAST TECHNIQUE: Multidetector CT imaging of the head and cervical spine was performed following the standard protocol without intravenous contrast. Multiplanar CT image reconstructions of the cervical spine were also generated. RADIATION DOSE REDUCTION: This exam was performed according to the departmental dose-optimization program which includes automated exposure control, adjustment of the mA and/or kV according to patient size and/or use of iterative reconstruction technique. COMPARISON:  None. FINDINGS: CT HEAD FINDINGS Brain: There are acute hemispheric subdural hematomas bilaterally. On the right, the hematoma measures up to 14 mm in thickness over the frontal lobe. On the left, the hematoma measures up to 11 mm in thickness over the frontal lobe. There is a posterior interhemispheric component as well which measures up to 11 mm in thickness. There are high density components and fluid-fluid levels associated with the subdural hematomas. Mass effect is present  on the right frontal lobe, although no midline shift or hydrocephalus is seen. There is no evidence of intraparenchymal hemorrhage or definite acute stroke. Vascular: Intracranial vascular calcifications. No hyperdense vessel identified. Skull: Calvarial hyperostosis without acute abnormality. Sinuses/Orbits: The visualized paranasal sinuses and mastoid air cells are clear. No orbital abnormalities are seen. Other: None. CT CERVICAL SPINE FINDINGS Alignment: Reversal of lordosis without focal angulation or listhesis. Skull base and vertebrae: No evidence of acute cervical spine fracture or traumatic subluxation. Soft tissues and spinal canal: No prevertebral fluid or swelling. No visible canal hematoma. Disc levels: Multilevel cervical spondylosis with uncinate spurring. Loss of disc height is greatest at C6-7. No large disc herniation identified. Upper chest: Clear lung apices. 1.9 cm  low-density right thyroid nodule. Other: None. IMPRESSION: 1. Sizable acute hemispheric subdural hematomas bilaterally with posterior interhemispheric extension. There is mass effect on the right frontal lobe, but no midline shift or hydrocephalus. Neurosurgical consultation recommended. 2. No evidence of intraparenchymal hemorrhage or acute stroke. 3. No evidence of acute cervical spine fracture, traumatic subluxation or static signs of instability. 4. Cervical spondylosis as described. 5. 1.9 cm incidental right thyroid nodule. Recommend thyroid US unless contraindicated by patient morbidities. Reference: J Am Coll Radiol. 2015 Feb;12(2): 143-50 6. Critical Value/emergent results were called by telephone at the time of interpretation on 05/05/2021 at 2:12 pm to provider ABIGAIL HARRIS , who verbally acknowledged these results. Electronically Signed   By: Richardean Sale M.D.   On: 05/05/2021 14:17   CT CERVICAL SPINE WO CONTRAST  Result Date: 05/05/2021 CLINICAL DATA:  Found down. Altered mental status. Generalized weakness for 2-3 months. EXAM: CT HEAD WITHOUT CONTRAST CT CERVICAL SPINE WITHOUT CONTRAST TECHNIQUE: Multidetector CT imaging of the head and cervical spine was performed following the standard protocol without intravenous contrast. Multiplanar CT image reconstructions of the cervical spine were also generated. RADIATION DOSE REDUCTION: This exam was performed according to the departmental dose-optimization program which includes automated exposure control, adjustment of the mA and/or kV according to patient size and/or use of iterative reconstruction technique. COMPARISON:  None. FINDINGS: CT HEAD FINDINGS Brain: There are acute hemispheric subdural hematomas bilaterally. On the right, the hematoma measures up to 14 mm in thickness over the frontal lobe. On the left, the hematoma measures up to 11 mm in thickness over the frontal lobe. There is a posterior interhemispheric component as well which  measures up to 11 mm in thickness. There are high density components and fluid-fluid levels associated with the subdural hematomas. Mass effect is present on the right frontal lobe, although no midline shift or hydrocephalus is seen. There is no evidence of intraparenchymal hemorrhage or definite acute stroke. Vascular: Intracranial vascular calcifications. No hyperdense vessel identified. Skull: Calvarial hyperostosis without acute abnormality. Sinuses/Orbits: The visualized paranasal sinuses and mastoid air cells are clear. No orbital abnormalities are seen. Other: None. CT CERVICAL SPINE FINDINGS Alignment: Reversal of lordosis without focal angulation or listhesis. Skull base and vertebrae: No evidence of acute cervical spine fracture or traumatic subluxation. Soft tissues and spinal canal: No prevertebral fluid or swelling. No visible canal hematoma. Disc levels: Multilevel cervical spondylosis with uncinate spurring. Loss of disc height is greatest at C6-7. No large disc herniation identified. Upper chest: Clear lung apices. 1.9 cm low-density right thyroid nodule. Other: None. IMPRESSION: 1. Sizable acute hemispheric subdural hematomas bilaterally with posterior interhemispheric extension. There is mass effect on the right frontal lobe, but no midline shift or hydrocephalus. Neurosurgical consultation recommended. 2. No evidence of  intraparenchymal hemorrhage or acute stroke. 3. No evidence of acute cervical spine fracture, traumatic subluxation or static signs of instability. 4. Cervical spondylosis as described. 5. 1.9 cm incidental right thyroid nodule. Recommend thyroid US unless contraindicated by patient morbidities. Reference: J Am Coll Radiol. 2015 Feb;12(2): 143-50 6. Critical Value/emergent results were called by telephone at the time of interpretation on 05/05/2021 at 2:12 pm to provider ABIGAIL HARRIS , who verbally acknowledged these results. Electronically Signed   By: Richardean Sale M.D.   On:  05/05/2021 14:17    Assessment/Plan: 75 year old female presented to the ED today after being found down at her house for over 24 hours. Family was  not present at bedside during todays examination. CT head shows acute on subacute bilateral convexity SDH and interhemispheric. She has been reversed with Kcentra. Obviously reversing her coumadin puts her at high risk for stroke since she has a mechanical mitral valve. This is very concerning. Right now there is no need for neurosurgical intervention as she is awake alert and fairly oriented. Would recommend CCM admit to ICU because of her extensive medical history with close monitoring and repeat head CT in the morning unless she has a change in neurological status to warrant a CT sooner. Spoke with her son on the phone to update him on plan of care. He was unsure of what aggressive measures that she would want to take as they have never had this conversation before. Consider cardiology to consult.   Ocie Cornfield Neddie Steedman 05/05/2021 4:06 PM

## 2021-05-05 NOTE — ED Notes (Signed)
Patient transported to X-ray 

## 2021-05-06 ENCOUNTER — Inpatient Hospital Stay (HOSPITAL_COMMUNITY): Payer: Medicare Other

## 2021-05-06 ENCOUNTER — Other Ambulatory Visit (HOSPITAL_COMMUNITY): Payer: Self-pay

## 2021-05-06 DIAGNOSIS — I059 Rheumatic mitral valve disease, unspecified: Secondary | ICD-10-CM | POA: Diagnosis not present

## 2021-05-06 DIAGNOSIS — I5022 Chronic systolic (congestive) heart failure: Secondary | ICD-10-CM | POA: Diagnosis not present

## 2021-05-06 DIAGNOSIS — S065X0A Traumatic subdural hemorrhage without loss of consciousness, initial encounter: Secondary | ICD-10-CM | POA: Diagnosis not present

## 2021-05-06 DIAGNOSIS — E440 Moderate protein-calorie malnutrition: Secondary | ICD-10-CM | POA: Insufficient documentation

## 2021-05-06 LAB — CBC
HCT: 30 % — ABNORMAL LOW (ref 36.0–46.0)
Hemoglobin: 9.4 g/dL — ABNORMAL LOW (ref 12.0–15.0)
MCH: 30.7 pg (ref 26.0–34.0)
MCHC: 31.3 g/dL (ref 30.0–36.0)
MCV: 98 fL (ref 80.0–100.0)
Platelets: 281 10*3/uL (ref 150–400)
RBC: 3.06 MIL/uL — ABNORMAL LOW (ref 3.87–5.11)
RDW: 13.5 % (ref 11.5–15.5)
WBC: 13.6 10*3/uL — ABNORMAL HIGH (ref 4.0–10.5)
nRBC: 0 % (ref 0.0–0.2)

## 2021-05-06 LAB — LIPID PANEL
Cholesterol: 121 mg/dL (ref 0–200)
HDL: 43 mg/dL (ref >40)
LDL Cholesterol: 65 mg/dL (ref 0–99)
Total CHOL/HDL Ratio: 2.8 RATIO
Triglycerides: 64 mg/dL (ref <150)
VLDL: 13 mg/dL (ref 0–40)

## 2021-05-06 LAB — BASIC METABOLIC PANEL
Anion gap: 8 (ref 5–15)
BUN: 32 mg/dL — ABNORMAL HIGH (ref 8–23)
CO2: 28 mmol/L (ref 22–32)
Calcium: 8.3 mg/dL — ABNORMAL LOW (ref 8.9–10.3)
Chloride: 104 mmol/L (ref 98–111)
Creatinine, Ser: 1.46 mg/dL — ABNORMAL HIGH (ref 0.44–1.00)
GFR, Estimated: 37 mL/min — ABNORMAL LOW (ref >60)
Glucose, Bld: 219 mg/dL — ABNORMAL HIGH (ref 70–99)
Potassium: 3.7 mmol/L (ref 3.5–5.1)
Sodium: 140 mmol/L (ref 135–145)

## 2021-05-06 LAB — GLUCOSE, CAPILLARY
Glucose-Capillary: 214 mg/dL — ABNORMAL HIGH (ref 70–99)
Glucose-Capillary: 169 mg/dL — ABNORMAL HIGH (ref 70–99)
Glucose-Capillary: 246 mg/dL — ABNORMAL HIGH (ref 70–99)
Glucose-Capillary: 162 mg/dL — ABNORMAL HIGH (ref 70–99)
Glucose-Capillary: 129 mg/dL — ABNORMAL HIGH (ref 70–99)

## 2021-05-06 LAB — PROTIME-INR
INR: 1.1 (ref 0.8–1.2)
Prothrombin Time: 14.5 seconds (ref 11.4–15.2)

## 2021-05-06 LAB — HEMOGLOBIN A1C
Hgb A1c MFr Bld: 5.4 % (ref 4.8–5.6)
Mean Plasma Glucose: 108 mg/dL

## 2021-05-06 LAB — TSH: TSH: 0.457 u[IU]/mL (ref 0.350–4.500)

## 2021-05-06 MED ORDER — POTASSIUM CHLORIDE CRYS ER 20 MEQ PO TBCR
40.0000 meq | EXTENDED_RELEASE_TABLET | Freq: Once | ORAL | Status: AC
  Administered 2021-05-06: 40 meq via ORAL
  Filled 2021-05-06: qty 2

## 2021-05-06 MED ORDER — INSULIN ASPART 100 UNIT/ML IJ SOLN
0.0000 [IU] | Freq: Three times a day (TID) | INTRAMUSCULAR | Status: DC
  Administered 2021-05-06: 16:00:00 3 [IU] via SUBCUTANEOUS
  Administered 2021-05-06: 13:00:00 5 [IU] via SUBCUTANEOUS
  Administered 2021-05-07: 13:00:00 2 [IU] via SUBCUTANEOUS
  Administered 2021-05-08 (×2): 3 [IU] via SUBCUTANEOUS
  Administered 2021-05-09 (×2): 2 [IU] via SUBCUTANEOUS
  Administered 2021-05-09 – 2021-05-10 (×3): 3 [IU] via SUBCUTANEOUS
  Administered 2021-05-10: 2 [IU] via SUBCUTANEOUS
  Administered 2021-05-11 – 2021-05-13 (×3): 3 [IU] via SUBCUTANEOUS
  Administered 2021-05-13: 2 [IU] via SUBCUTANEOUS
  Administered 2021-05-13: 3 [IU] via SUBCUTANEOUS
  Administered 2021-05-14 (×2): 2 [IU] via SUBCUTANEOUS
  Administered 2021-05-15 – 2021-05-17 (×5): 3 [IU] via SUBCUTANEOUS

## 2021-05-06 MED ORDER — ENSURE ENLIVE PO LIQD: 237.0000 mL | Freq: Three times a day (TID) | ORAL | Status: DC

## 2021-05-06 MED ORDER — CHLORHEXIDINE GLUCONATE CLOTH 2 % EX PADS
6.0000 | MEDICATED_PAD | Freq: Every day | CUTANEOUS | Status: DC
  Administered 2021-05-05 – 2021-05-17 (×12): 6 via TOPICAL

## 2021-05-06 MED ORDER — SODIUM CHLORIDE 0.9% FLUSH
3.0000 mL | Freq: Two times a day (BID) | INTRAVENOUS | Status: DC
  Administered 2021-05-06 – 2021-05-21 (×30): 3 mL via INTRAVENOUS

## 2021-05-06 MED ORDER — PANTOPRAZOLE 2 MG/ML SUSPENSION
40.0000 mg | Freq: Every day | ORAL | Status: DC
  Filled 2021-05-06: qty 20

## 2021-05-06 MED ORDER — PANTOPRAZOLE 2 MG/ML SUSPENSION
40.0000 mg | Freq: Every day | ORAL | Status: DC
  Administered 2021-05-06: 40 mg via ORAL
  Filled 2021-05-06: qty 20

## 2021-05-06 MED ORDER — PANTOPRAZOLE SODIUM 40 MG PO TBEC
40.0000 mg | DELAYED_RELEASE_TABLET | Freq: Every day | ORAL | Status: DC
  Administered 2021-05-07 – 2021-05-11 (×5): 40 mg via ORAL
  Filled 2021-05-06 (×6): qty 1

## 2021-05-06 MED ORDER — GLUCERNA SHAKE PO LIQD
237.0000 mL | Freq: Three times a day (TID) | ORAL | Status: DC
  Administered 2021-05-06 – 2021-05-12 (×11): 237 mL via ORAL

## 2021-05-06 NOTE — Progress Notes (Signed)
Patient seen and examined.  She is awake and alert.  She moves all extremities.  Her neurologic exam has not changed.  Her CT scan was reviewed and the hypodense portions of the bilateral subdural hematomas have enlarged and there is a little more mass-effect on the frontal lobes.  Her Kcentra reversed her coagulopathy to an INR of 1.8 last night.  Very difficult situation given her mechanical mitral valve.  Reversal of her coagulopathy puts her at significant risk of complications regarding the valve.  We do not really know her goals of care.  She is on Lipitor 40 mg/day and there are at least 3 recalls in our literature suggesting that Lipitor changes micro vascularity of subdural hematomas and can reduce the size of them over time.  I actually think she is an excellent candidate for middle meningeal artery embolization as there is evidence in our literature that this also can reduce the size of chronic subdural hematomas.  We will put her on antiseizure prophylaxis.  I am hesitant to operate on her with her EF of 25%.  She is COVID-positive by PCR.

## 2021-05-06 NOTE — Progress Notes (Signed)
°  NEUROSURGERY PROGRESS NOTE   Spoke with Dr. Yetta Barre, history reviewed in EMR and with patient and son, daughter-in-law at bedside. Pt admitted with AMS, hx of prosthetic mitral valve on coumadin. INR 10 upon admission with CT demonstrating relatively large bilateral convexity subacute on chronic SDH.  EXAM:  BP (!) 100/52 (BP Location: Right Arm)    Pulse 82    Temp 97.8 F (36.6 C) (Oral)    Resp (!) 22    Ht 5\' 3"  (1.6 m)    Wt 67.9 kg    SpO2 94%    BMI 26.52 kg/m   Awake, alert Speech fluent, appropriate  CN grossly intact, decrease hearing AU 5/5 BUE/BLE   IMAGING: CTH reviewed and demonstrates bilateral convexity subacute SDH. There is associated mass effect upon the frontal lobes bilaterally. No HCP. There is also posterior falcine SDH.  IMPRESSION:  76 y.o. female with prosthetic mitral valve and CHF with supratherapeutic INR and likely spontaneous bilateral SDH. I agree with Dr. 62 that she would be relatively higher risk surgical candidate, however there is also significant risk being off AC with a prosthetic valve. I therefore do think the attempt at bilateral MMA embolization is a reasonable option to attempt to avoid open surgical evacuation and minimize time off AC.   PLAN: - We will plan on proceeding with endovascular bilateral middle meningeal artery embolization tomorrow - Hold coumadin for now, assuming no complications with procedure tomorrow, could potentially restart AC over weekend. - Cont current mgmt per PCCM/cardiology  I have reviewed the treatment options for the SDH with the patient and her family. I did review the somewhat difficult situation but I told them I think the lowest risk option is likely to attempt MMA embo. I did review with them the risks of the procedure to include stroke, dissection, contrast nephropathy, and groin hematoma. We also discussed the postoperative course and recovery. In addition, we did discuss the possibility that she may require  surgery even with MMA embolization. All their questions today were answered, the patient is willing to proceed and her son provided consent on her behalf.  Yetta Barre, MD [facility]

## 2021-05-06 NOTE — Evaluation (Signed)
Occupational Therapy Evaluation Patient Details Name: Deborah [name] MRN: [id number] DOB: December 12, 1945 Today's Date: 05/06/2021   History of Present Illness 76 yo admitted 1/25 after found on bathroom floor by family. Pt with bifrontal and acute parafalcine SDH, pt denies fall. PMHx: CHF, FTT, HOH, DM, HLD, HTN   Clinical Impression   PTA, pt lives alone and reports Independence with all ADLs, IADLs, mobility without AD. Pt also reports still driving, grocery shopping and managing meds. Pt denies hx of falls initially though does report correctly being at hospital due to a fall. Pt presents now with flat affect, slower processing and decreased overall awareness of deficits. Pt requires Mod A for bed mobility, Min A for short mobility in room with hands on assist needed due to unsteadiness. Pt requires Supervision for UB ADLs and Mod A for LB ADLs. At this time, pt significantly below reported baseline and would recommend SNF rehab prior to return home to maximize independence. Plan to further address cognition during functional tasks, as well as progress ADL mobility with DME trial.   BP stable pre and post activity, denies lightheadedness     Recommendations for follow up therapy are one component of a multi-disciplinary discharge planning process, led by the attending physician.  Recommendations may be updated based on patient status, additional functional criteria and insurance authorization.   Follow Up Recommendations  Skilled nursing-short term rehab (<3 hours/day)    Assistance Recommended at Discharge Frequent or constant Supervision/Assistance  Patient can return home with the following A little help with walking and/or transfers;A lot of help with bathing/dressing/bathroom;Assistance with cooking/housework;Direct supervision/assist for medications management;Direct supervision/assist for financial management;Assist for transportation;Help with stairs or ramp for entrance    Functional  Status Assessment  Patient has had a recent decline in their functional status and demonstrates the ability to make significant improvements in function in a reasonable and predictable amount of time.  Equipment Recommendations  Other (comment) (defer to next venue)    Recommendations for Other Services       Precautions / Restrictions Precautions Precautions: Fall Restrictions Weight Bearing Restrictions: No      Mobility Bed Mobility Overal bed mobility: Needs Assistance Bed Mobility: Supine to Sit     Supine to sit: Mod assist, HOB elevated     General bed mobility comments: able to initiate with cues but difficulty sustaining task, assist for  BLE and to lift trunk    Transfers Overall transfer level: Needs assistance Equipment used: 1 person hand held assist Transfers: Sit to/from Stand Sit to Stand: Min assist           General transfer comment: Min A to steady in standing, noted to lean B LE against bed      Balance Overall balance assessment: Needs assistance, History of Falls Sitting-balance support: No upper extremity supported, Feet supported Sitting balance-Leahy Scale: Fair     Standing balance support: Single extremity supported, During functional activity, No upper extremity supported Standing balance-Leahy Scale: Poor Standing balance comment: reliant on UE support in standing, reaching to bed/footboard for mobility; narrow BOS                           ADL either performed or assessed with clinical judgement   ADL Overall ADL's : Needs assistance/impaired Eating/Feeding: Independent;Sitting Eating/Feeding Details (indicate cue type and reason): able to cut pancakes, open banana Grooming: Set up;Sitting Grooming Details (indicate cue type and reason): spit in spit cup,  wipe mouth Upper Body Bathing: Supervision/ safety;Sitting   Lower Body Bathing: Moderate assistance;Sit to/from stand   Upper Body Dressing :  Supervision/safety;Sitting   Lower Body Dressing: Moderate assistance;Sit to/from stand   Toilet Transfer: Minimal assistance;Ambulation   Toileting- Clothing Manipulation and Hygiene: Moderate assistance;Sit to/from stand       Functional mobility during ADLs: Minimal assistance General ADL Comments: Pt with flat affect, decreased awareness of current deficits with further cognitive assessment needed     Vision Ability to See in Adequate Light: 1 Impaired Patient Visual Report: No change from baseline Vision Assessment?: No apparent visual deficits     Perception     Praxis      Pertinent Vitals/Pain Pain Assessment Pain Assessment: No/denies pain     Hand Dominance Right   Extremity/Trunk Assessment Upper Extremity Assessment Upper Extremity Assessment: Generalized weakness   Lower Extremity Assessment Lower Extremity Assessment: Defer to PT evaluation   Cervical / Trunk Assessment Cervical / Trunk Assessment: Normal   Communication Communication Communication: HOH   Cognition Arousal/Alertness: Awake/alert Behavior During Therapy: Flat affect Overall Cognitive Status: No family/caregiver present to determine baseline cognitive functioning Area of Impairment: Attention, Memory, Following commands, Safety/judgement, Awareness, Problem solving                   Current Attention Level: Selective Memory: Decreased short-term memory Following Commands: Follows one step commands with increased time, Follows one step commands consistently Safety/Judgement: Decreased awareness of deficits Awareness: Intellectual Problem Solving: Slow processing, Decreased initiation, Difficulty sequencing, Requires verbal cues, Requires tactile cues General Comments: Pt able to answer orientation questions appropriately (reports being here due to a fall) but when asked initially if she has had falls at home, she reported no. Pt flat affect, requires repetition of cues though  largely due to [facility]. Decreased awareness of deficits (reported not feeling unsteady when walking though observed deficits)     General Comments  VSS on RA, BP stable, pt denies dizziness/lightheadedness    Exercises     Shoulder Instructions      Home Living Family/patient expects to be discharged to:: Private residence Living Arrangements: Alone Available Help at Discharge: Family;Available PRN/intermittently Type of Home: Apartment Home Access: Level entry     Home Layout: One level     Bathroom Shower/Tub: Teacher, early years/pre: Standard     Home Equipment: Conservation officer, nature (2 wheels)          Prior Functioning/Environment Prior Level of Function : Independent/Modified Independent;Driving;History of Falls (last six months);Patient poor historian/Family not available             Mobility Comments: pt reports no use of AD, was using RW after admission 4/22 ADLs Comments: Pt reports "I do it all", ADLs, IADLs including cooking, laundry, driving and grocery shopping        OT Problem List: Decreased strength;Decreased activity tolerance;Impaired balance (sitting and/or standing);Decreased cognition;Decreased safety awareness      OT Treatment/Interventions: Self-care/ADL training;Therapeutic exercise;Energy conservation;DME and/or AE instruction;Therapeutic activities;Patient/family education;Balance training    OT Goals(Current goals can be found in the care plan section) Acute Rehab OT Goals Patient Stated Goal: eat some breakfast OT Goal Formulation: With patient Time For Goal Achievement: 05/13/21 Potential to Achieve Goals: Good  OT Frequency: Min 2X/week    Co-evaluation              AM-PAC OT "6 Clicks" Daily Activity     Outcome Measure Help from another person eating meals?:  None Help from another person taking care of personal grooming?: A Little Help from another person toileting, which includes using toliet, bedpan, or urinal?: A  Lot Help from another person bathing (including washing, rinsing, drying)?: A Lot Help from another person to put on and taking off regular upper body clothing?: A Little Help from another person to put on and taking off regular lower body clothing?: A Lot 6 Click Score: 16   End of Session Equipment Utilized During Treatment: Gait belt Nurse Communication: Mobility status  Activity Tolerance: Patient tolerated treatment well Patient left: in chair;with call bell/phone within reach;with chair alarm set;Other (comment) (floor mat)  OT Visit Diagnosis: Unsteadiness on feet (R26.81);Other abnormalities of gait and mobility (R26.89);Muscle weakness (generalized) (M62.81);History of falling (Z91.81);Other symptoms and signs involving cognitive function                Time: [id number] OT Time Calculation (min): 29 min Charges:  OT General Charges $OT Visit: 1 Visit OT Evaluation $OT Eval Moderate Complexity: 1 Mod OT Treatments $Self Care/Home Management : 8-22 mins  Malachy Chamber, OTR/L Acute Rehab Services Office: [phone]   Layla Maw 05/06/2021, 8:56 AM

## 2021-05-06 NOTE — Progress Notes (Signed)
Initial Nutrition Assessment  DOCUMENTATION CODES:   Non-severe (moderate) malnutrition in context of chronic illness  INTERVENTION:   Liberalize diet to regular, discussed with MD.  Toni Arthurs Ensure Enlive po TID, each supplement provides 350 kcal and 20 grams of protein.  MVI with minerals daily.  RN to obtain accurate weight.  NUTRITION DIAGNOSIS:   Moderate Malnutrition related to chronic illness (CHF) as evidenced by mild fat depletion, mild muscle depletion, moderate muscle depletion.  GOAL:   Patient will meet greater than or equal to 90% of their needs  MONITOR:   PO intake, Supplement acceptance, Labs  REASON FOR ASSESSMENT:   Consult Assessment of nutrition requirement/status  ASSESSMENT:   76 yo female admitted with SDH, COVID 19 positive. PMH includes CHF, AICD, mitral valve replacement, DM, HLD, HTN.  Discussed patient in ICU rounds and with RN today. Received Kcentra to reverse coagulopathy last night.    Patient reports that she has been eating okay. At home, patient was living alone. She has lost a lot of weight; usually 150 lbs, now down to 133 lbs. She has been feeling bad with no appetite for the past few months. Family member in the room with patient states that he bought some Boost supplements for patient at home, but she didn't like the chocolate flavor. She prefers vanilla. Family members can tell that patient has lost a lot of weight by her appearance.   Labs reviewed.  CBG: 214-169  Medications reviewed and include Novolog, Klor-Con.  Current weight (67.9 kg) appears to be transferred over from last encounter on 01/15/21. Need to obtain accurate weight for nutrition assessment. Discussed with RN and she will obtain a current weight when able.   Patient meets criteria for moderate malnutrition given mild-moderate depletion of muscle and subcutaneous fat mass.   NUTRITION - FOCUSED PHYSICAL EXAM:  Flowsheet Row Most Recent Value  Orbital  Region Mild depletion  Upper Arm Region Mild depletion  Thoracic and Lumbar Region Mild depletion  Buccal Region Mild depletion  Temple Region Moderate depletion  Clavicle Bone Region Moderate depletion  Clavicle and Acromion Bone Region Mild depletion  Scapular Bone Region Mild depletion  Dorsal Hand Severe depletion  Patellar Region Moderate depletion  Anterior Thigh Region Moderate depletion  Posterior Calf Region Moderate depletion  Edema (RD Assessment) Mild  Hair Reviewed  Eyes Reviewed  Mouth Reviewed  Skin Reviewed  Nails Reviewed       Diet Order:   Diet Order             Diet heart healthy/carb modified Room service appropriate? Yes; Fluid consistency: Thin  Diet effective now                   EDUCATION NEEDS:   No education needs have been identified at this time  Skin:  Skin Assessment: Reviewed RN Assessment  Last BM:  no BM documented  Height:   Ht Readings from Last 1 Encounters:  05/05/21 5\' 3"  (1.6 m)    Weight:   Wt Readings from Last 1 Encounters:  05/05/21 67.9 kg    BMI:  Body mass index is 26.52 kg/m.  Estimated Nutritional Needs:   Kcal:  1550-1750  Protein:  85-95 gm  Fluid:  1.5-1.7 L    05/07/21 RD, LDN, CNSC Please refer to Amion for contact information.

## 2021-05-06 NOTE — Progress Notes (Signed)
[facility] ADULT ICU REPLACEMENT PROTOCOL   The patient does apply for the [facility] Adult ICU Electrolyte Replacment Protocol based on the criteria listed below:   1.Exclusion criteria: TCTS patients, ECMO patients, and Dialysis patients 2. Is GFR >/= 30 ml/min? Yes.    Patient's GFR today is 37 3. Is SCr </= 2? Yes.   Patient's SCr is 1.46 mg/dL 4. Did SCr increase >/= 0.5 in 24 hours? No. 5.Pt's weight >40kg  Yes.   6. Abnormal electrolyte(s): K+ 3.7  7. Electrolytes replaced per protocol 8.  Call MD STAT for K+ </= 2.5, Phos </= 1, or Mag </= 1 Physician:  n/a  Melvern Banker 05/06/2021 6:23 AM

## 2021-05-06 NOTE — Progress Notes (Signed)
NAME:  Deborah [name], MRN:  [id number], DOB:  May 17, 1945, LOS: 1 ADMISSION DATE:  05/05/2021, CONSULTATION DATE: 05/05/21 REFERRING MD: Dr. 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 on coumadin, HLD, DM, and anemia who presented to [facility] ER by EMS with altered mental status and weakness.   Patient lives alone.  Her sister was unable to reach her yesterday or today.  They went to check on her and she was found her on the bathroom floor.  EMS report notes that she stated she had lost control of her bladder and sat on the floor to clean her self but was too weak to get up.  Denied falling, dizziness, syncope, etc.  Also reports not eating/ drinking for 16 hours.  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 on coumadin, HLD, DM, anemia, prior GI bleed   Significant Hospital Events: Including procedures, antibiotic start and stop dates in addition to other pertinent events   1/25 Admitted, NSGY consulted, reversed with Theodoro Parma 1/25 CT head; sizable acute hemispheric subdural hematoma with mass-effect on right frontal lobe 1/26 CT head; progressive bilateral subdural hematoma with increased mass-effect on frontal lobe.  Interim History / Subjective:  No acute events overnight Patient resting comfortably this morning No acute complaints  Objective   Blood pressure 125/77, pulse 87, temperature 98.5 F (36.9 C), temperature source Oral, resp. rate (!) 25, height 5\' 3"  (1.6 m), weight 67.9 kg, SpO2 93 %.        Intake/Output Summary (Last 24 hours) at 05/06/2021 05/08/2021 Last data filed at 05/06/2021 0400 Gross per 24 hour  Intake 554.56 ml  Output 200 ml  Net 354.56 ml   Filed Weights   05/05/21 1241  Weight: 67.9 kg    Examination: General: Well-appearing,  frail elderly woman.  NAD HENT: [place]/AT. Moist mucous membranes Lungs: CTAB.  No wheezing or rales. Cardiovascular: RRR.  Mitral click murmur.  No rubs or gallops. Abdomen: Soft.  NT/ND.  Positive bowel sounds. Extremities: Well perfused.  No edema. Neuro: Alert and oriented x3.  Moves all extremities. Psych: Flat affect.  Decreased mood.  Labs reviewed: CBGs 212, 214, 169 Creatinine 1.46, WBC 13.6, Hgb 9.4  Resolved Hospital Problem list    Assessment & Plan:  Subdural hematoma without coma Repeat CT head mild enlargement of bilateral subdural hematomas with increased mass-effect on frontal lobes.  Remained neurologically stable.  Neurosurgery considering patient for the middle meningeal artery embolization and will likely need to discuss risk and benefits of this due to patient's severe heart failure. --Neurosurgery following, appreciate rec --Continue Lipitor 40 mg daily --Antiseizure prophylaxis --GOC discussion with patient and family  HFpEF Severe MR s/p mechanical MVR Echo on 07/22/2020 showed EF 20 to 25%, severely hypokinetic LV wall, moderately elevated PASP, stable MVR with mild regurg.  On room air.  No signs of acute heart failure exacerbation. --Gentle hydration in the setting of low EF --Continue Coreg --Strict I&O's --Hold Lasix  Warfarin-induced coagulopathy Unsure if patient was taking warfarin.  INR has been reversed with Kcentra.  PT/INR 14.5/1.1.  Hemoglobin stable --Trend PT/INR  COVID-19 positive Incidental finding on admission.  Chest x-ray does not show any cardiopulmonary disease.  Patient with mild white count but no fevers.  Remains on room  air and denies any URI symptoms. --Continue precautions --CTM  AKI on CKD3B Baseline creatinine around 1.3-1.4. Mild AKI likely due to decreased fluid intake. Will closely monitor I&O.  --Gentle fluid hydration --Encourage p.o. intake --Avoid nephrotoxic's agent  HTN HLD SBP in the 120s to 130s. --Continue  Coreg and Imdur --Continue on atorvastatin --Pending lipid panel   Protein calorie malnutrition Functional decline Tolerating diet so far.  Continue Ensure --RD following, appreciate rec  Hyperglycemia CBGs over 200 overnight and this morning.  -- SSI, moderate --CBG monitoring --Follow-up A1c   GOC / Dispo Some concerns about patient's living situation and ability to care for self.  Will need a multidisciplinary discussion with patient and family about risk and benefits of middle meningeal embolization versus medical therapy for her subdural hematoma. --TOC consult  Best Practice (right click and "Reselect all SmartList Selections" daily)   Diet/type: Regular consistency (see orders) DVT prophylaxis: not indicated GI prophylaxis: N/A Lines: N/A Foley:  N/A Code Status:  full code Last date of multidisciplinary goals of care discussion [pending]  Labs   CBC: Recent Labs  Lab 05/05/21 1300 05/06/21 0504  WBC 11.2* 13.6*  NEUTROABS 9.5*  --   HGB 10.4* 9.4*  HCT 32.1* 30.0*  MCV 100.0 98.0  PLT 302 281    Basic Metabolic Panel: Recent Labs  Lab 05/05/21 1300 05/06/21 0504  NA 139 140  K 4.2 3.7  CL 101 104  CO2 27 28  GLUCOSE 181* 219*  BUN 29* 32*  CREATININE 1.39* 1.46*  CALCIUM 8.7* 8.3*   GFR: Estimated Creatinine Clearance: 29.9 mL/min (A) (by C-G formula based on SCr of 1.46 mg/dL (H)). Recent Labs  Lab 05/05/21 1300 05/06/21 0504  WBC 11.2* 13.6*  LATICACIDVEN 2.8*  --     Liver Function Tests: Recent Labs  Lab 05/05/21 1300  AST 40  ALT 24  ALKPHOS 65  BILITOT 1.2  PROT 6.3*  ALBUMIN 3.2*   No results for input(s): LIPASE, AMYLASE in the last 168 hours. Recent Labs  Lab 05/05/21 1300  AMMONIA 25    ABG    Component Value Date/Time   HCO3 28.2 (H) 12/07/2006 1925   TCO2 28 05/10/2011 1420     Coagulation Profile: Recent Labs  Lab 05/05/21 1320 05/05/21 1752 05/05/21 2309 05/06/21 0504  INR >10.0* 1.5* 1.3* 1.1     Cardiac Enzymes: Recent Labs  Lab 05/05/21 1300  CKTOTAL 1,011*    HbA1C: Hgb A1c MFr Bld  Date/Time Value Ref Range Status  12/04/2014 12:12 PM 5.9 (H) 4.8 - 5.6 % Final    Comment:    (NOTE)         Pre-diabetes: 5.7 - 6.4         Diabetes: >6.4         Glycemic control for adults with diabetes: <7.0   12/08/2006 04:10 AM   Final   6.1 (NOTE)   The ADA recommends the following therapeutic goals for glycemic   control related to Hgb A1C measurement:   Goal of Therapy:   < 7.0% Hgb A1C   Action Suggested:  > 8.0% Hgb A1C   Ref:  Diabetes Care, 22, Suppl. 1, 1999    CBG: Recent Labs  Lab 05/05/21 1738 05/05/21 1930 05/05/21 2341 05/06/21 0352 05/06/21 0822  GLUCAP 124* 114* 212* 214* 169*     Critical care time: 30

## 2021-05-06 NOTE — Evaluation (Signed)
Physical Therapy Evaluation Patient Details Name: Deborah [name] MRN: [id number] DOB: November 08, 1945 Today's Date: 05/06/2021  History of Present Illness  76 yo admitted 1/25 after found on bathroom floor by family. Pt with bifrontal and acute parafalcine SDH, pt denies fall. PMHx: CHF, FTT, HOH, DM, HLD, HTN  Clinical Impression  Pt pleasant with slow processing, decreased strength, balance and mobility. Pt lives alone and normally is independent and walks or rides the bus. Family in [place] state she can live with them and pt states she is agreeable to this plan.Pt will benefit from acute therapy to maximize mobility, safety and independence.        Recommendations for follow up therapy are one component of a multi-disciplinary discharge planning process, led by the attending physician.  Recommendations may be updated based on patient status, additional functional criteria and insurance authorization.  Follow Up Recommendations Home health PT With assist of family, pt plans to live with son in [place]   Assistance Recommended at Discharge    Patient can return home with the following  A little help with bathing/dressing/bathroom;A little help with walking and/or transfers;Help with stairs or ramp for entrance;Assistance with cooking/housework;Direct supervision/assist for financial management    Equipment Recommendations Rolling walker (2 wheels);BSC/3in1  Recommendations for Other Services       Functional Status Assessment Patient has had a recent decline in their functional status and demonstrates the ability to make significant improvements in function in a reasonable and predictable amount of time.     Precautions / Restrictions Precautions Precautions: Fall      Mobility  Bed Mobility               General bed mobility comments: pt in recliner on arrival and end of session    Transfers Overall transfer level: Needs assistance   Transfers: Sit to/from Stand Sit to Stand:  Min assist           General transfer comment: min assist to rise from recliner and BSC, use of RW to pivot recliner>BSC with min assist to direct and control transfers    Ambulation/Gait Ambulation/Gait assistance: Min assist Gait Distance (Feet): 140 Feet Assistive device: Rolling walker (2 wheels) Gait Pattern/deviations: Step-through pattern, Decreased stride length   Gait velocity interpretation: <1.8 ft/sec, indicate of risk for recurrent falls   General Gait Details: cues for proximity to RW, assist to change direction, slow speed and directional cues  Stairs            Wheelchair Mobility    Modified Rankin (Stroke Patients Only) Modified Rankin (Stroke Patients Only) Pre-Morbid Rankin Score: No symptoms Modified Rankin: Moderately severe disability     Balance Overall balance assessment: Needs assistance, History of Falls Sitting-balance support: No upper extremity supported, Feet supported Sitting balance-Leahy Scale: Fair     Standing balance support: Bilateral upper extremity supported Standing balance-Leahy Scale: Poor Standing balance comment: RW support in standing and for gait                             Pertinent Vitals/Pain Pain Assessment Pain Assessment: No/denies pain    Home Living Family/patient expects to be discharged to:: Private residence   Available Help at Discharge: Family;Available PRN/intermittently Type of Home: Apartment Home Access: Level entry       Home Layout: One level Home Equipment: None Additional Comments: son lives in [place] and states pt can come live with him with nearly 24hrs  assist. but has flight of stairs    Prior Function Prior Level of Function : Independent/Modified Independent             Mobility Comments: pt without RW at home per family and very independent. Pt walks everywhere or takes the bus ADLs Comments: independent, does not drive     Hand Dominance         Extremity/Trunk Assessment   Upper Extremity Assessment Upper Extremity Assessment: Generalized weakness    Lower Extremity Assessment Lower Extremity Assessment: Generalized weakness    Cervical / Trunk Assessment Cervical / Trunk Assessment: Normal  Communication   Communication: HOH (hearing aids)  Cognition Arousal/Alertness: Awake/alert Behavior During Therapy: Flat affect Overall Cognitive Status: Impaired/Different from baseline Area of Impairment: Memory, Attention                   Current Attention Level: Selective Memory: Decreased short-term memory Following Commands: Follows one step commands with increased time, Follows one step commands consistently Safety/Judgement: Decreased awareness of deficits   Problem Solving: Slow processing General Comments: Pt with hearing aids present and able to provide updated PLOF which family confirms. PT able to voice need for toileting but lacks awareness of deficits and not sure how she ended up on the floor at home        General Comments      Exercises     Assessment/Plan    PT Assessment Patient needs continued PT services  PT Problem List Decreased strength;Decreased mobility;Decreased activity tolerance;Decreased balance;Decreased knowledge of use of DME       PT Treatment Interventions Gait training;Functional mobility training;Therapeutic activities;Patient/family education;DME instruction;Therapeutic exercise;Balance training    PT Goals (Current goals can be found in the Care Plan section)  Acute Rehab PT Goals Patient Stated Goal: be independent PT Goal Formulation: With patient/family Time For Goal Achievement: 05/20/21 Potential to Achieve Goals: Good    Frequency Min 3X/week     Co-evaluation               AM-PAC PT "6 Clicks" Mobility  Outcome Measure Help needed turning from your back to your side while in a flat bed without using bedrails?: A Little Help needed moving from  lying on your back to sitting on the side of a flat bed without using bedrails?: A Little Help needed moving to and from a bed to a chair (including a wheelchair)?: A Little Help needed standing up from a chair using your arms (e.g., wheelchair or bedside chair)?: A Little Help needed to walk in hospital room?: A Little Help needed climbing 3-5 steps with a railing? : A Lot 6 Click Score: 17    End of Session Equipment Utilized During Treatment: Gait belt Activity Tolerance: Patient tolerated treatment well Patient left: in chair;with call bell/phone within reach;with chair alarm set;with nursing/sitter in room;with family/visitor present Nurse Communication: Mobility status PT Visit Diagnosis: Other abnormalities of gait and mobility (R26.89);Difficulty in walking, not elsewhere classified (R26.2);Muscle weakness (generalized) (M62.81)    Time: [id number] PT Time Calculation (min) (ACUTE ONLY): 31 min   Charges:   PT Evaluation $PT Eval Moderate Complexity: 1 Mod PT Treatments $Gait Training: 8-22 mins        Althia Egolf P, PT Acute Rehabilitation Services Pager: [phone] Office: [phone]   Jerrard Bradburn B Lamerle Jabs 05/06/2021, 2:03 PM

## 2021-05-06 NOTE — Evaluation (Signed)
Speech Language Pathology Evaluation Patient Details Name: Deborah [name] MRN: [id number] DOB: [birth year] Today's Date: 05/06/2021 Time: 1324-4010 SLP Time Calculation (min) (ACUTE ONLY): 30 min  Problem List:  Patient Active Problem List   Diagnosis Date Noted   Subdural hematoma without coma 05/05/2021   Failure to thrive in adult 05/05/2021   Severe protein-calorie malnutrition (HCC) 05/05/2021   Recurrent falls 05/05/2021   Warfarin-induced coagulopathy (HCC)    Acute on chronic anemia 07/21/2020   AKI (acute kidney injury) (HCC) 07/21/2020   Lactic acidosis 07/21/2020   Diabetes type 2, controlled (HCC)    Chronic systolic CHF (congestive heart failure) (HCC)    Pulmonary hypertension (HCC)    HLD (hyperlipidemia)    ICD-St.Jude 08/02/2010   COPD, MILD 11/19/2009   Aortic valve disorder 07/30/2008   HYPERTENSION, BENIGN 01/24/2008   Mitral valve disorder. (Status post mechanical valve replacement) 01/24/2008   Past Medical History:  Past Medical History:  Diagnosis Date   Acute gastric ulcer without hemorrhage or perforation    AICD (automatic cardioverter/defibrillator) present    Aortic stenosis, mild    mean gradient echo 09/09   CHF (congestive heart failure) (HCC)    --Non-ischemic CM EF 30-35% by echo 9/09 (Previous 25%)  --s/p St. Jude single chamber ICD 04/09 --Minimal  CAD by cath 2004 LAD 20% LCX 20% Ramus 30% RCA nl --echo 05/11 EF 20-25%   Diabetes mellitus    type 2   Hyperlipidemia    Hypertension    Kidney stones    MR (congenital mitral regurgitation)    Severe MR s/p St. Jude mechanical MVR by Gasper Lloyd 2005   Past Surgical History:  Past Surgical History:  Procedure Laterality Date   ABDOMINAL HYSTERECTOMY     For menorrhagia and.  Associated severe pain.  Sounds like she had fibroids.   APPENDECTOMY  1979   BIOPSY  07/24/2020   Procedure: BIOPSY;  Surgeon: Beverley Fiedler, MD;  Location: [facility] ENDOSCOPY;  Service: Gastroenterology;;    CARDIAC DEFIBRILLATOR PLACEMENT  2009   CARDIAC VALVE SURGERY  2005   ESOPHAGOGASTRODUODENOSCOPY N/A 12/05/2014   Procedure: ESOPHAGOGASTRODUODENOSCOPY (EGD);  Surgeon: Dorena Cookey, MD;  Location[facility] ENDOSCOPY;  Service: Endoscopy;  Laterality: N/A;   ESOPHAGOGASTRODUODENOSCOPY (EGD) WITH PROPOFOL N/A 07/24/2020   Procedure: ESOPHAGOGASTRODUODENOSCOPY (EGD) WITH PROPOFOL;  Surgeon: Beverley Fiedler, MD;  Location: [facility] ENDOSCOPY;  Service: Gastroenterology;  Laterality: N/A;   ICD GENERATOR CHANGEOUT N/A 08/01/2016   Procedure: ICD Generator Changeout;  Surgeon: Duke Salvia, MD;  Location: [facility] INVASIVE CV LAB;  Service: Cardiovascular;  Laterality: N/A;   INSERT / REPLACE / REMOVE PACEMAKER     HPI:  76 yo admitted 1/25 after found on bathroom floor by family. Pt with bifrontal and acute parafalcine SDH, pt denies fall. PMHx: CHF, FTT, HOH, DM, HLD, HTN   Assessment / Plan / Recommendation Clinical Impression  Pt seen for cognitive linguistic evaluation. SLP administered SLUMS examination to pt; Unable to complete assessment d/t hearing loss and cooperation. Areas of deficit noted throughout the session include decreased awareness of deficits, short term memory recall, and safety. Throughout the evaluation, pt benefited from increased wait time, repetition, increasing vocal intensity, and leaning down to her ear in order to hear tasks. Pt asked to recall 3 objects; correctly identified 1/3 independently. Accuracy improved to 2/3 with choice cues. Pt also tasked with naming animals; Unable to complete task resulting in 0% accuracy. Pt will benefit from continued speech-language services targeting functional  tasks to recall new information, safety, awareness, and naming.    SLP Assessment  SLP Recommendation/Assessment: Patient needs continued Speech Lanaguage Pathology Services SLP Visit Diagnosis: Cognitive communication deficit (R41.841)    Recommendations for follow up therapy are one component of a  multi-disciplinary discharge planning process, led by the attending physician.  Recommendations may be updated based on patient status, additional functional criteria and insurance authorization.    Follow Up Recommendations  Skilled nursing-short term rehab (<3 hours/day)    Assistance Recommended at Discharge  Frequent or constant Supervision/Assistance  Functional Status Assessment Patient has had a recent decline in their functional status and demonstrates the ability to make significant improvements in function in a reasonable and predictable amount of time.  Frequency and Duration min 2x/week  2 weeks      SLP Evaluation Cognition  Overall Cognitive Status: No family/caregiver present to determine baseline cognitive functioning Arousal/Alertness: Awake/alert Orientation Level: Oriented to person;Oriented to place;Oriented to time;Oriented to situation Year: 2023 Day of Week: Correct Attention: Selective Selective Attention: Impaired Selective Attention Impairment: Verbal basic;Verbal complex;Functional basic Memory: Impaired Memory Impairment: Storage deficit;Decreased recall of new information;Decreased short term memory Decreased Short Term Memory: Verbal basic Awareness: Impaired (Simultaneous filing. User may not have seen previous data.) Awareness Impairment: Intellectual impairment;Emergent impairment;Anticipatory impairment Problem Solving: Impaired Problem Solving Impairment: Functional basic;Functional complex Behaviors: Poor frustration tolerance (pt frustrated "Y'all are annoying me") Safety/Judgment: Impaired       Comprehension  Auditory Comprehension Overall Auditory Comprehension: Appears within functional limits for tasks assessed (Simultaneous filing. User may not have seen previous data.) Yes/No Questions: Not tested Commands: Impaired;Within Functional Limits Conversation: Simple Interfering Components: Attention;Hearing EffectiveTechniques: Extra  processing time;Increased volume;Pausing;Repetition;Stressing words;Visual/Gestural cues Visual Recognition/Discrimination Discrimination: Not tested Reading Comprehension Reading Status: Not tested    Expression Expression Primary Mode of Expression: Verbal Verbal Expression Overall Verbal Expression: Appears within functional limits for tasks assessed Initiation: No impairment Automatic Speech: Name;Social Response Level of Generative/Spontaneous Verbalization: Sentence Naming: Impairment Responsive: 0-25% accurate Confrontation: Impaired Convergent: Not tested Divergent: Not tested Pragmatics: Unable to assess Interfering Components: Attention Effective Techniques: Open ended questions;Semantic cues Non-Verbal Means of Communication: Not applicable Written Expression Dominant Hand: Right Written Expression: Not tested   Oral / Motor  Oral Motor/Sensory Function Overall Oral Motor/Sensory Function: Within functional limits Motor Speech Overall Motor Speech: Appears within functional limits for tasks assessed Respiration: Within functional limits Phonation: Normal Resonance: Within functional limits Articulation: Within functional limitis Intelligibility: Intelligible Motor Planning: Witnin functional limits Motor Speech Errors: Not applicable Interfering Components: Hearing loss            NIKE 05/06/2021, 10:48 AM

## 2021-05-06 NOTE — Consult Note (Addendum)
Advanced Heart Failure Team Consult Note   Primary Physician: Merrilee Seashore, MD PCP-Cardiologist:  Glori Bickers, MD  Reason for Consultation: SDH in setting of anticoagulation for mechanical mitral valve, chronic systolic CHF  HPI:    Deborah [name] is seen today for evaluation of SDH in setting of anticoagulation for mechanical mitral valve, chronic systolic CHF at the request of Dr. Tamala Julian with [facility].   76 y.o.  female  with a history of systolic CHF, nonischemic cardiomyopathy, ejection fraction 25% s/p St Jude single chamber ICD in 2009.  She also has a history of mitral valve disease and status post mitral valve replacement with a St. Jude valve in 2005.  T2DM, hypertension, hyperlipidemia, and kidney stones.    CPX performed June 2011 pVO2 16.8 slope 26.1 RER 1.11 O2 pulse 90% predicted O2 sat 87% at peak with possible hypoventilation.   05/12/11 ECHO 20-25%.  Mechanical MVR appears normal.   03/19/13 Echo 25% Mechanical MVR appears normal.   12/04/14 Echo EF 20-25% 08/10/16 EF 20% MVR ok    04/2017 Entresto was increased. Saw Dr Caryl Comes 01/2018 and coreg was decreased due to dizziness.      Last seen in HF clinic in March 2020. Echo 03/20 EF 20-25%, RV okay, mild MR.   Echo 04/22: EF 20-25%, RV okay, RVSP 46 mmHg, trivial MR, mean gradient of 4 mmHg across mechanical aortic valve.    Admission 07/2020 for supratherapeutic INR, H. Pylori  and upper GI bleed peptic ulcer disease, duodenitis.  Started on PPI, H. Pylori abx and given vit K and ffp to improve INR down to therapeutic range.        Seen in [facility] clinic in May 2022 and reestablished in HF clinic 10/22. Was prescribed lasix d/t elevated BNP and leg edema but not taking. She was to follow-up in 1 month for HF medication titration but missed her appointment. She missed multiple scheduled appointments this past year.  Her son and daughter in law are present at bedside and provided history. EMS called for a well check  yesterday after her sister was unable to get in touch with her. She was found on the floor. Patient had no recollection of how she ended up on the floor. She was admitted for bilateral SDH. CT head showed bilateral subdural hematoma with mass effect on right frontal lobe, likely acute on chronic. Recurrent falls suspected. INR > 10 on presentation. She was given [place]. INR now 1.1 today. Repeat CT this am with increasing size of hypodense portions of bilateral subdural hematomas with more mass effect on frontal lobes.  Neurosurgery has seen. Planning for middle meningeal artery embolization tomorrow. She is incidentally COVID positive.  Patient's son and wife report they saw her at end of October and she seemed more like herself. She was previously independent and walked to the store. They note that she seems to have lost a lot of weight since then. They visited her home and found spoiled food and several expired prescriptions that she had been taking. Family mentions she had been taking coumadin religiously in the past. Last INR check on 12/16, missed INR draw 12/30.    Review of Systems: [y] = yes, [ ]  = no   General: Weight gain [ ] ; Weight loss [Y]; Anorexia [Y]; Fatigue [ ] ; Fever [ ] ; Chills [ ] ; Weakness [Y]  Cardiac: Chest pain/pressure [ ] ; Resting SOB [ ] ; Exertional SOB [ ] ; Orthopnea [ ] ; Pedal Edema [ ] ; Palpitations [ ] ;  Syncope [ ] ; Presyncope [ ] ; Paroxysmal nocturnal dyspnea[ ]   Pulmonary: Cough [ ] ; Wheezing[ ] ; Hemoptysis[ ] ; Sputum [ ] ; Snoring [ ]   GI: Vomiting[ ] ; Dysphagia[ ] ; Melena[ ] ; Hematochezia [ ] ; Heartburn[ ] ; Abdominal pain [ ] ; Constipation [ ] ; Diarrhea [ ] ; BRBPR [ ]   GU: Hematuria[ ] ; Dysuria [ ] ; Nocturia[ ]   Vascular: Pain in legs with walking [ ] ; Pain in feet with lying flat [ ] ; Non-healing sores [ ] ; Stroke [ ] ; TIA [ ] ; Slurred speech [ ] ;  Neuro: Headaches[ ] ; Vertigo[ ] ; Seizures[ ] ; Paresthesias[ ] ;Blurred vision [ ] ; Diplopia [ ] ; Vision changes [ ]    Ortho/Skin: Arthritis [ ] ; Joint pain [ ] ; Muscle pain [ ] ; Joint swelling [ ] ; Back Pain [ ] ; Rash [ ]   Psych: Depression[ ] ; Anxiety[ ]   Heme: Bleeding problems [ ] ; Clotting disorders [ ] ; Anemia [ ]   Endocrine: Diabetes [ ] ; Thyroid dysfunction[ ]   Home Medications Prior to Admission medications   Medication Sig Start Date End Date Taking? Authorizing Provider  atorvastatin (LIPITOR) 40 MG tablet Take 1 tablet (40 mg total) by mouth daily. 08/11/20  Yes Ghimire, Henreitta Leber, MD  carvedilol (COREG) 12.5 MG tablet Take 1 tablet (12.5 mg total) by mouth 2 (two) times daily with a meal. 08/21/20  Yes Winfrey, Jenne Pane, MD  HYDROcodone-acetaminophen (NORCO) 7.5-325 MG tablet Take 1 tablet by mouth 2 (two) times daily as needed for moderate pain. Expired. Said taken this month. Taken as needed for pain 06/18/16  Yes [provider]  ondansetron (ZOFRAN-ODT) 4 MG disintegrating tablet Take 4 mg by mouth every 8 (eight) hours as needed for nausea or vomiting.   Yes [provider]  temazepam (RESTORIL) 15 MG capsule Take 15 mg by mouth at bedtime as needed for sleep. 07/11/16  Yes [provider]  tiZANidine (ZANAFLEX) 2 MG tablet Take 2 mg by mouth at bedtime. Takes at night as needed   Yes [provider]  warfarin (COUMADIN) 5 MG tablet Take 1 and 1/2 tablets by mouth daily as directed by the coumadin clinic 01/13/21  Yes Deboraha Sprang, MD  furosemide (LASIX) 20 MG tablet Take 1 tablet (20 mg total) by mouth daily. Patient not taking: Reported on 05/06/2021 01/20/21 01/20/22  Joette Catching, PA-C  hydrALAZINE (APRESOLINE) 25 MG tablet Take 1 tablet (25 mg total) by mouth 3 (three) times daily. 12/29/20 03/29/21  Shirley Friar, PA-C  isosorbide mononitrate (IMDUR) 30 MG 24 hr tablet Take 1 tablet (30 mg total) by mouth daily. 12/29/20 03/29/21  Shirley Friar, PA-C  pantoprazole (PROTONIX) 40 MG tablet Take 1 tablet (40 mg total) by mouth 2  (two) times daily. Patient taking differently: Take 40 mg by mouth 2 (two) times daily. As needed 07/31/20   Jonetta Osgood, MD  potassium chloride SA (KLOR-CON M20) 20 MEQ tablet Take 1 tablet (20 mEq total) by mouth daily. Patient not taking: Reported on 05/06/2021 01/20/21   Joette Catching, PA-C    Past Medical History: Past Medical History:  Diagnosis Date   Acute gastric ulcer without hemorrhage or perforation    AICD (automatic cardioverter/defibrillator) present    Aortic stenosis, mild    mean gradient 65mmHg echo 09/09   CHF (congestive heart failure) ([place])    --Non-ischemic CM EF 30-35% by echo 9/09 (Previous 25%)  --s/p St. Jude single chamber ICD 04/09 --Minimal  CAD by cath 2004 LAD 20% LCX 20% Ramus 30% RCA  nl --echo 05/11 EF 20-25%   Diabetes mellitus    type 2   Hyperlipidemia    Hypertension    Kidney stones    MR (congenital mitral regurgitation)    Severe MR s/p St. Jude mechanical MVR by [facility] 2005    Past Surgical History: Past Surgical History:  Procedure Laterality Date   ABDOMINAL HYSTERECTOMY     For menorrhagia and.  Associated severe pain.  Sounds like she had fibroids.   APPENDECTOMY  1979   BIOPSY  07/24/2020   Procedure: BIOPSY;  Surgeon: Jerene Bears, MD;  Location: [facility] ENDOSCOPY;  Service: Gastroenterology;;   CARDIAC DEFIBRILLATOR PLACEMENT  2009   CARDIAC VALVE SURGERY  2005   ESOPHAGOGASTRODUODENOSCOPY N/A 12/05/2014   Procedure: ESOPHAGOGASTRODUODENOSCOPY (EGD);  Surgeon: Teena Irani, MD;  Location: [facility] ENDOSCOPY;  Service: Endoscopy;  Laterality: N/A;   ESOPHAGOGASTRODUODENOSCOPY (EGD) WITH PROPOFOL N/A 07/24/2020   Procedure: ESOPHAGOGASTRODUODENOSCOPY (EGD) WITH PROPOFOL;  Surgeon: Jerene Bears, MD;  Location: [facility] ENDOSCOPY;  Service: Gastroenterology;  Laterality: N/A;   ICD GENERATOR CHANGEOUT N/A 08/01/2016   Procedure: ICD Generator Changeout;  Surgeon: Deboraha Sprang, MD;  Location: [facility];  Service:  Cardiovascular;  Laterality: N/A;   INSERT / REPLACE / REMOVE PACEMAKER      Family History: Family History  Problem Relation Age of Onset   Heart disease Mother     Social History: Social History   Socioeconomic History   Marital status: Single    Spouse name: Not on file   Number of children: Not on file   Years of education: Not on file   Highest education level: Not on file  Occupational History   Occupation: CNA    Employer: RETIRED    Comment: disabled  Tobacco Use   Smoking status: Former    Types: Cigarettes    Quit date: 06/16/1984    Years since quitting: 36.9   Smokeless tobacco: Never  Substance and Sexual Activity   Alcohol use: No   Drug use: No   Sexual activity: Not on file  Other Topics Concern   Not on file  Social History Narrative   Former smoker, started in early 20's - less than 1 ppd / Quit in her late 20's   Pt is single   Pt has one child, a son   Pt lives alone   Pt is disabled, previously worked as Quarry manager x 12 years   Social Determinants of Radio broadcast assistant Strain: Not on [employer] Insecurity: Not on file  Transportation Needs: Not on file  Physical Activity: Not on file  Stress: Not on file  Social Connections: Not on file    Allergies:  Allergies  Allergen Reactions   Strawberry Extract Rash    Rash & itching.    Objective:    Vital Signs:   Temp:  [97.6 F (36.4 C)-98.9 F (37.2 C)] 97.6 F (36.4 C) (01/26 1200) Pulse Rate:  [70-112] 102 (01/26 1130) Resp:  [16-30] 30 (01/26 1130) BP: (102-138)/(56-105) 130/87 (01/26 1100) SpO2:  [89 %-100 %] 96 % (01/26 1130) Last BM Date:  (PTA)  Weight change: Filed Weights   05/05/21 1241  Weight: 67.9 kg    Intake/Output:   Intake/Output Summary (Last 24 hours) at 05/06/2021 1406 Last data filed at 05/06/2021 0958 Gross per 24 hour  Intake 557.56 ml  Output 200 ml  Net 357.56 ml      Physical Exam    General:  Cachectic, elderly female in no distress.  Appears frail. HEENT: normal Neck: supple. No JVD. Carotids 2+ bilat; no bruits. No lymphadenopathy or thyromegaly appreciated. Cor: PMI nondisplaced. Regular rate & rhythm. No rubs, gallops or murmurs, S1, mechanical S2 Lungs: clear Abdomen: soft, nontender, nondistended. No hepatosplenomegaly. No bruits or masses. Good bowel sounds. Extremities: no cyanosis, clubbing, rash, edema Neuro: Flat affect.    Telemetry   SR 80s-100s, ~ 10 PVCs/min  EKG    SR 71 bpm, PACs, 71 bpm  Labs   Basic Metabolic Panel: Recent Labs  Lab 05/05/21 1300 05/06/21 0504  NA 139 140  K 4.2 3.7  CL 101 104  CO2 27 28  GLUCOSE 181* 219*  BUN 29* 32*  CREATININE 1.39* 1.46*  CALCIUM 8.7* 8.3*    Liver Function Tests: Recent Labs  Lab 05/05/21 1300  AST 40  ALT 24  ALKPHOS 65  BILITOT 1.2  PROT 6.3*  ALBUMIN 3.2*   No results for input(s): LIPASE, AMYLASE in the last 168 hours. Recent Labs  Lab 05/05/21 1300  AMMONIA 25    CBC: Recent Labs  Lab 05/05/21 1300 05/06/21 0504  WBC 11.2* 13.6*  NEUTROABS 9.5*  --   HGB 10.4* 9.4*  HCT 32.1* 30.0*  MCV 100.0 98.0  PLT 302 281    Cardiac Enzymes: Recent Labs  Lab 05/05/21 1300  CKTOTAL 1,011*    BNP: BNP (last 3 results) Recent Labs    01/15/21 1121  BNP 614.5*    ProBNP (last 3 results) No results for input(s): PROBNP in the last 8760 hours.   CBG: Recent Labs  Lab 05/05/21 1930 05/05/21 2341 05/06/21 0352 05/06/21 0822 05/06/21 1213  GLUCAP 114* 212* 214* 169* 246*    Coagulation Studies: Recent Labs    05/05/21 1320 05/05/21 1752 05/05/21 2309 05/06/21 0504  LABPROT >90.0* 18.2* 16.0* 14.5  INR >10.0* 1.5* 1.3* 1.1     Imaging   CT HEAD WO CONTRAST (5MM)  Result Date: 05/06/2021 CLINICAL DATA:  Follow-up subdural hematoma EXAM: CT HEAD WITHOUT CONTRAST TECHNIQUE: Contiguous axial images were obtained from the base of the skull through the vertex without intravenous contrast. RADIATION  DOSE REDUCTION: This exam was performed according to the departmental dose-optimization program which includes automated exposure control, adjustment of the mA and/or kV according to patient size and/or use of iterative reconstruction technique. COMPARISON:  Head CT from yesterday FINDINGS: Brain: Bilateral mixed density subdural hematoma encompassing the cerebral convexities. Greater anterior low-density component bilaterally which may be from increasing hygroma component or layering of the clot. Maximal thickness on the left is 14 mm and on the right is 13 mm. These low-density components appear somewhat increased with further bifrontal mass effect. No hydrocephalus or complicating infarct. Chronic small vessel ischemia. Vascular: Negative Skull: No acute finding Sinuses/Orbits: No acute finding IMPRESSION: Extensive bilateral subdural hematoma with mixed density. The low-density components have increased with further bifrontal mass effect as described. Electronically Signed   By: Jorje Guild M.D.   On: 05/06/2021 06:02     Medications:     Current Medications:  atorvastatin  40 mg Oral Daily   carvedilol  12.5 mg Oral BID WC   Chlorhexidine Gluconate Cloth  6 each Topical Daily   feeding supplement  237 mL Oral TID BM   hydrALAZINE  25 mg Oral TID   insulin aspart  0-15 Units Subcutaneous TID WC   isosorbide mononitrate  30 mg Oral Daily   pantoprazole  40 mg Oral  Daily   sodium chloride flush  3 mL Intravenous Q12H    Infusions:     Patient Profile   76 y.o. female with history of chronic systolic CHF, NICM with EF 20-25% s/p ICD, hx mechanical mitral valve, hx GI bleed. Admitted with SDH in setting of supratherapeutic INR.  Assessment/Plan   SDH: -B/l, appear larger on CT head today -Neuro consdiering meningeal artery embolization tomorrow -In setting of supratherapeutic INR. INR >10 on admit. Uncertain if taking coumadin as prescribed +/- malnutrition. Given Kcentra. INR 1.1  today. Missed INR appointment on 12/30.  -? Falls at home  2. Chronic systolic CHF/NICM: - nonischemic cardiomyopathy, ejection fraction 25% s/p St Jude single chamber ICD in 2009  - EF 20-25% in 11/2014 - Echo 08/2016. EF 15% - Echo 06/2018 EF 20-25%  - 07/2020 EF 20-25%, RWMA, normal functioning mechanical mitral valve.  Moderately elevated PASP - with RWMA it is possible she has developed CAD since 2009 but denies any chest discomfort - Volume status stable on exam. Prescribed furosemide at home but not taking d/t urinary frequency - Continue coreg 12.5 mg BID - Continue imdur 30 mg daily and hydralazine 25 mg TID - Previously stopped entresto d/t worsening creatinine and hyperkalemia - Not on SGLT2i d/t UTIs - Repeat echo to reassess LV function and mechanical mitral valve  3. Mitral valve disease s/p mechanical MVR in 2005: - On coumadin, compliance not certain as above.  - anticoagulation on hold in view of SDH and anticipation of middle meningeal artery embolization tomorrow - MV functioning normally on last echo in 04/22 - Repeat echo as above  4. CKD IIIb: - Scr baseline around 1.3 - 1.46 today  5. COVID positive: - Appears asymptomatic - O2 stable on RA  6. Hx Iron deficiency anemia: -upper GI bleed 04/22 in the setting of supratherapeutic INR and H. Pylori  -iron infusion in 06/22 -Hgb 10.4>9.4  Worry about adherence with medical therapy at home. Missed her last follow-up in HF clinic. Concern about functional decline over last few months and her ability to manage at home. Her son is planning to move her to [place] with him after discharge.  TOC consulted  Length of Stay: 1  FINCH, LINDSAY N, PA-C  05/06/2021, 2:06 PM  Advanced Heart Failure Team Pager [phone] (M-F; 7a - 5p)  Please contact [facility] for night-coverage after hours (4p -7a ) and weekends on [email or web address]   Patient seen and examined with the above-signed Advanced Practice Provider and/or  Housestaff. I personally reviewed laboratory data, imaging studies and relevant notes. I independently examined the patient and formulated the important aspects of the plan. I have edited the note to reflect any of my changes or salient points. I have personally discussed the plan with the patient and/or family.  76 y/o women with systolic HF, mechanica MVR who we are asked to see in the setting of bilateral SDH.   I have not seen her for some time as she has missed multiple office visits. Apparently has not been doing well at home. Has lost 35-40 pounds. Family found her on the floor at home naked. She had bruises all over her and had been eating spoiled food.   Came to ER. INR > 10. Found to have bilateral SDH. Felt not to be a candidate for surgery. Planning embolization of bilateral MMA tomorrow.  She is currently sitting in chair. Looks weak. But in no distress.   General:  Weak appearing. No resp difficulty  HEENT: normal Neck: supple. no JVD. Carotids 2+ bilat; no bruits. No lymphadenopathy or thryomegaly appreciated. Cor: PMI nondisplaced. Regular rate & rhythm.Mechanical s1 Lungs: clear Abdomen: soft, nontender, nondistended. No hepatosplenomegaly. No bruits or masses. Good bowel sounds. Extremities: no cyanosis, clubbing, rash, edema Neuro: alert & orientedx3, cranial nerves grossly intact. moves all 4 extremities w/o difficulty. Affect flat   She is currently stable from a cardiac perspective. Appreciate NSU's care. If she needed emergent surgical drainage I think this would be OK from our standpoint. INR now 1.1. With mechanical MVR, will need to restart [facility] as soon as safely possible once OK from bleeding perspective.   My main concerns are her overall/general decline and her ability to live independently going forward and take her medicines effectively. I discussed this with her daughter and son-in-law who share my concerns. We will involve Palliative Care to help with goals of care  and planning next steps.   Glori Bickers, MD  6:43 PM

## 2021-05-06 NOTE — TOC CAGE-AID Note (Signed)
Transition of Care [facility]) - CAGE-AID Screening   Patient Details  Name: Deborah [name] MRN: [id number] Date of Birth: 01-Mar-1946  Transition of Care [facility]) CM/SW Contact:    Everrett Lacasse C Tarpley-Carter, LCSWA Phone Number: 05/06/2021, 10:24 AM   Clinical Narrative: Pt is unable to participate in Cage Aid. Pt is experiencing altered mental status.  Lavanda Nevels Tarpley-Carter, MSW, LCSW-A Pronouns:  She/Her/Hers [place] Transitions of Care Clinical Social Worker Direct Number:  [phone] [email or web address]  CAGE-AID Screening: Substance Abuse Screening unable to be completed due to: : Patient unable to participate             Substance Abuse Education Offered: No

## 2021-05-07 ENCOUNTER — Inpatient Hospital Stay (HOSPITAL_COMMUNITY): Payer: Medicare Other | Admitting: Anesthesiology

## 2021-05-07 ENCOUNTER — Other Ambulatory Visit (HOSPITAL_COMMUNITY): Payer: Self-pay | Admitting: *Deleted

## 2021-05-07 ENCOUNTER — Encounter (HOSPITAL_COMMUNITY): Payer: Self-pay | Admitting: Pulmonary Disease

## 2021-05-07 ENCOUNTER — Encounter (HOSPITAL_COMMUNITY)
Admission: EM | Disposition: A | Discharge status: Hospice | Payer: Self-pay | Source: Home / Self Care | Attending: Internal Medicine

## 2021-05-07 ENCOUNTER — Other Ambulatory Visit (HOSPITAL_COMMUNITY): Payer: Self-pay | Admitting: Emergency Medicine

## 2021-05-07 ENCOUNTER — Inpatient Hospital Stay (HOSPITAL_COMMUNITY): Payer: Medicare Other

## 2021-05-07 DIAGNOSIS — I5022 Chronic systolic (congestive) heart failure: Secondary | ICD-10-CM

## 2021-05-07 DIAGNOSIS — T45515A Adverse effect of anticoagulants, initial encounter: Secondary | ICD-10-CM

## 2021-05-07 DIAGNOSIS — N179 Acute kidney failure, unspecified: Secondary | ICD-10-CM | POA: Diagnosis not present

## 2021-05-07 DIAGNOSIS — S065X0A Traumatic subdural hemorrhage without loss of consciousness, initial encounter: Secondary | ICD-10-CM | POA: Diagnosis not present

## 2021-05-07 DIAGNOSIS — S065XAA Traumatic subdural hemorrhage with loss of consciousness status unknown, initial encounter: Secondary | ICD-10-CM | POA: Diagnosis not present

## 2021-05-07 DIAGNOSIS — R627 Adult failure to thrive: Secondary | ICD-10-CM | POA: Diagnosis not present

## 2021-05-07 DIAGNOSIS — I059 Rheumatic mitral valve disease, unspecified: Secondary | ICD-10-CM

## 2021-05-07 DIAGNOSIS — D6832 Hemorrhagic disorder due to extrinsic circulating anticoagulants: Secondary | ICD-10-CM

## 2021-05-07 DIAGNOSIS — E440 Moderate protein-calorie malnutrition: Secondary | ICD-10-CM

## 2021-05-07 DIAGNOSIS — Z515 Encounter for palliative care: Secondary | ICD-10-CM

## 2021-05-07 HISTORY — PX: RADIOLOGY WITH ANESTHESIA: SHX6223

## 2021-05-07 HISTORY — PX: IR NEURO EACH ADD'L AFTER BASIC UNI LEFT (MS): IMG5373

## 2021-05-07 HISTORY — PX: IR TRANSCATH/EMBOLIZ: IMG695

## 2021-05-07 HISTORY — PX: IR NEURO EACH ADD'L AFTER BASIC UNI RIGHT (MS): IMG5374

## 2021-05-07 HISTORY — PX: IR ANGIO EXTERNAL CAROTID SEL EXT CAROTID BILAT MOD SED: IMG5372

## 2021-05-07 LAB — GLUCOSE, CAPILLARY
Glucose-Capillary: 121 mg/dL — ABNORMAL HIGH (ref 70–99)
Glucose-Capillary: 149 mg/dL — ABNORMAL HIGH (ref 70–99)
Glucose-Capillary: 102 mg/dL — ABNORMAL HIGH (ref 70–99)
Glucose-Capillary: 120 mg/dL — ABNORMAL HIGH (ref 70–99)

## 2021-05-07 LAB — CBC
HCT: 29 % — ABNORMAL LOW (ref 36.0–46.0)
Hemoglobin: 9.3 g/dL — ABNORMAL LOW (ref 12.0–15.0)
MCH: 31.6 pg (ref 26.0–34.0)
MCHC: 32.1 g/dL (ref 30.0–36.0)
MCV: 98.6 fL (ref 80.0–100.0)
Platelets: 253 10*3/uL (ref 150–400)
RBC: 2.94 MIL/uL — ABNORMAL LOW (ref 3.87–5.11)
RDW: 13.4 % (ref 11.5–15.5)
WBC: 13 10*3/uL — ABNORMAL HIGH (ref 4.0–10.5)
nRBC: 0 % (ref 0.0–0.2)

## 2021-05-07 LAB — ECHOCARDIOGRAM COMPLETE
AV Peak grad: 7.5 mmHg
Ao pk vel: 1.37 m/s
Area-P 1/2: 4.63 cm2
Height: 63 in
MV M vel: 4.96 m/s
MV Peak grad: 98.4 mmHg
S' Lateral: 5.3 cm
Weight: 2165.8 oz

## 2021-05-07 LAB — TYPE AND SCREEN
ABO/RH(D): O POS
Antibody Screen: NEGATIVE

## 2021-05-07 LAB — BASIC METABOLIC PANEL
Anion gap: 7 (ref 5–15)
BUN: 30 mg/dL — ABNORMAL HIGH (ref 8–23)
CO2: 26 mmol/L (ref 22–32)
Calcium: 8.1 mg/dL — ABNORMAL LOW (ref 8.9–10.3)
Chloride: 105 mmol/L (ref 98–111)
Creatinine, Ser: 1.13 mg/dL — ABNORMAL HIGH (ref 0.44–1.00)
GFR, Estimated: 50 mL/min — ABNORMAL LOW (ref >60)
Glucose, Bld: 150 mg/dL — ABNORMAL HIGH (ref 70–99)
Potassium: 3.8 mmol/L (ref 3.5–5.1)
Sodium: 138 mmol/L (ref 135–145)

## 2021-05-07 LAB — PROTIME-INR
INR: 1.1 (ref 0.8–1.2)
Prothrombin Time: 14.6 seconds (ref 11.4–15.2)

## 2021-05-07 LAB — APTT: aPTT: 35 seconds (ref 24–36)

## 2021-05-07 LAB — MAGNESIUM: Magnesium: 1.6 mg/dL — ABNORMAL LOW (ref 1.7–2.4)

## 2021-05-07 SURGERY — IR WITH ANESTHESIA
Anesthesia: General

## 2021-05-07 MED ORDER — ROCURONIUM BROMIDE 10 MG/ML (PF) SYRINGE
PREFILLED_SYRINGE | INTRAVENOUS | Status: DC | PRN
  Administered 2021-05-07: 10 mg via INTRAVENOUS
  Administered 2021-05-07: 50 mg via INTRAVENOUS

## 2021-05-07 MED ORDER — CARVEDILOL 12.5 MG PO TABS
12.5000 mg | ORAL_TABLET | Freq: Two times a day (BID) | ORAL | Status: DC
  Administered 2021-05-07 – 2021-05-19 (×24): 12.5 mg via ORAL
  Filled 2021-05-07 (×24): qty 1

## 2021-05-07 MED ORDER — HEPARIN SODIUM (PORCINE) 1000 UNIT/ML IJ SOLN
INTRAMUSCULAR | Status: DC | PRN
Start: 2021-05-07 — End: 2021-05-07
  Administered 2021-05-07: 3000 [IU] via INTRAVENOUS

## 2021-05-07 MED ORDER — LIDOCAINE 2% (20 MG/ML) 5 ML SYRINGE
INTRAMUSCULAR | Status: DC | PRN
  Administered 2021-05-07: 60 mg via INTRAVENOUS

## 2021-05-07 MED ORDER — POTASSIUM CHLORIDE CRYS ER 20 MEQ PO TBCR
20.0000 meq | EXTENDED_RELEASE_TABLET | Freq: Once | ORAL | Status: AC
  Administered 2021-05-07: 20 meq via ORAL
  Filled 2021-05-07: qty 1

## 2021-05-07 MED ORDER — SUGAMMADEX SODIUM 200 MG/2ML IV SOLN
INTRAVENOUS | Status: DC | PRN
  Administered 2021-05-07: 150 mg via INTRAVENOUS

## 2021-05-07 MED ORDER — PROPOFOL 10 MG/ML IV BOLUS: INTRAVENOUS | Status: DC | PRN

## 2021-05-07 MED ORDER — ETOMIDATE 2 MG/ML IV SOLN
INTRAVENOUS | Status: DC | PRN
  Administered 2021-05-07: 10 mg via INTRAVENOUS

## 2021-05-07 MED ORDER — SODIUM CHLORIDE 0.9 % IV SOLN
INTRAVENOUS | Status: DC | PRN
Start: 2021-05-07 — End: 2021-05-07

## 2021-05-07 MED ORDER — IOHEXOL 300 MG/ML  SOLN
100.0000 mL | Freq: Once | INTRAMUSCULAR | Status: AC | PRN
  Administered 2021-05-07: 50 mL via INTRA_ARTERIAL

## 2021-05-07 MED ORDER — ACETAMINOPHEN 325 MG PO TABS: 650.0000 mg | ORAL_TABLET | Freq: Four times a day (QID) | ORAL | Status: DC | PRN

## 2021-05-07 MED ORDER — MAGNESIUM SULFATE 4 GM/100ML IV SOLN
4.0000 g | Freq: Once | INTRAVENOUS | Status: AC
Start: 2021-05-07 — End: 2021-05-07
  Administered 2021-05-07: 4 g via INTRAVENOUS
  Filled 2021-05-07: qty 100

## 2021-05-07 MED ORDER — IOHEXOL 300 MG/ML  SOLN: 100.0000 mL | Freq: Once | INTRAMUSCULAR | Status: DC | PRN

## 2021-05-07 MED ORDER — EPHEDRINE SULFATE-NACL 50-0.9 MG/10ML-% IV SOSY
PREFILLED_SYRINGE | INTRAVENOUS | Status: DC | PRN
  Administered 2021-05-07: 10 mg via INTRAVENOUS
  Administered 2021-05-07: 15 mg via INTRAVENOUS

## 2021-05-07 MED ORDER — ONDANSETRON HCL 4 MG/2ML IJ SOLN
INTRAMUSCULAR | Status: DC | PRN
Start: 2021-05-07 — End: 2021-05-07
  Administered 2021-05-07: 4 mg via INTRAVENOUS

## 2021-05-07 MED ORDER — PHENYLEPHRINE HCL-NACL 20-0.9 MG/250ML-% IV SOLN
INTRAVENOUS | Status: DC | PRN
  Administered 2021-05-07: 15 ug/min via INTRAVENOUS

## 2021-05-07 MED ORDER — FENTANYL CITRATE (PF) 100 MCG/2ML IJ SOLN
INTRAMUSCULAR | Status: DC | PRN
Start: 2021-05-07 — End: 2021-05-07
  Administered 2021-05-07: 50 ug via INTRAVENOUS

## 2021-05-07 MED ORDER — POLYETHYLENE GLYCOL 3350 17 G PO PACK: 17.0000 g | PACK | Freq: Every day | ORAL | Status: DC | PRN

## 2021-05-07 MED ORDER — PHENYLEPHRINE 40 MCG/ML (10ML) SYRINGE FOR IV PUSH (FOR BLOOD PRESSURE SUPPORT)
PREFILLED_SYRINGE | INTRAVENOUS | Status: DC | PRN
  Administered 2021-05-07: 40 ug via INTRAVENOUS
  Administered 2021-05-07: 160 ug via INTRAVENOUS
  Administered 2021-05-07: 80 ug via INTRAVENOUS

## 2021-05-07 NOTE — Progress Notes (Signed)
[facility] ADULT ICU REPLACEMENT PROTOCOL   The patient does apply for the [facility] Adult ICU Electrolyte Replacment Protocol based on the criteria listed below:   1.Exclusion criteria: TCTS patients, ECMO patients, and Dialysis patients 2. Is GFR >/= 30 ml/min? Yes.    Patient's GFR today is 50 3. Is SCr </= 2? Yes.   Patient's SCr is 1.13 mg/dL 4. Did SCr increase >/= 0.5 in 24 hours? No. 5.Pt's weight >40kg  Yes.   6. Abnormal electrolyte(s): mag 1.6  7. Electrolytes replaced per protocol 8.  Call MD STAT for K+ </= 2.5, Phos </= 1, or Mag </= 1 Physician:  n/a  Melvern Banker 05/07/2021 4:53 AM

## 2021-05-07 NOTE — Anesthesia Procedure Notes (Signed)
Procedure Name: Intubation Date/Time: 05/07/2021 5:57 PM Performed by: Barrington Ellison, CRNA Pre-anesthesia Checklist: Patient identified, Emergency Drugs available, Suction available and Patient being monitored Patient Re-evaluated:Patient Re-evaluated prior to induction Oxygen Delivery Method: Circle System Utilized Preoxygenation: Pre-oxygenation with 100% oxygen Induction Type: IV induction Ventilation: Mask ventilation without difficulty Laryngoscope Size: McGraph and 3 (covid +) Grade View: Grade I Tube type: Oral Tube size: 7.0 mm Number of attempts: 1 Airway Equipment and Method: Stylet and Oral airway Placement Confirmation: ETT inserted through vocal cords under direct vision, positive ETCO2 and breath sounds checked- equal and bilateral Secured at: 22 cm Tube secured with: Tape Dental Injury: Teeth and Oropharynx as per pre-operative assessment

## 2021-05-07 NOTE — Progress Notes (Signed)
NAME:  Deborah [name], MRN:  [id number], DOB:  07-Apr-1946, LOS: 2 ADMISSION DATE:  05/05/2021, CONSULTATION DATE: 05/05/21 REFERRING MD: Dr. 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 [facility] on coumadin, HLD, DM, and anemia who presented to [facility] ER by EMS with altered mental status and weakness.   Patient lives alone.  Her sister was unable to reach her yesterday or today.  They went to check on her and she was found her on the bathroom floor.  EMS report notes that she stated she had lost control of her bladder and sat on the floor to clean her self but was too weak to get up.  Denied falling, dizziness, syncope, etc.  Also reports not eating/ drinking for 16 hours.  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 [facility] on coumadin, HLD, DM, anemia, prior GI bleed   Significant Hospital Events: Including procedures, antibiotic start and stop dates in addition to other pertinent events   1/25 Admitted, NSGY consulted, reversed with Theodoro Parma 1/25 CT head; sizable acute hemispheric subdural hematoma with mass-effect on right frontal lobe 1/26 CT head; progressive bilateral subdural hematoma with increased mass-effect on frontal lobe.  Interim History / Subjective:  No acute events overnight Patient states she feels well this morning No acute complaints.   Objective   Blood pressure 114/71, pulse 67, temperature 98.1 F (36.7 C), temperature source Oral, resp. rate 19, height 5\' 3"  (1.6 m), weight 61.4 kg, SpO2 97 %.        Intake/Output Summary (Last 24 hours) at 05/07/2021 0721 Last data filed at 05/06/2021 05/08/2021 Gross per 24 hour  Intake 3 ml  Output --  Net 3 ml   Filed Weights   05/05/21 1241 05/07/21 0600  Weight: 67.9 kg 61.4 kg    Examination: General:  Well-appearing, frail elderly woman.  NAD HENT: [place]/AT. Moist mucous membranes Lungs: CTAB.  No wheezing or rales. Cardiovascular: RRR.  Mitral click murmur.  No rubs or gallops. Abdomen: Soft. NT/ND. Normal BS Extremities: Well perfused.  No edema. Neuro: Alert and oriented. Strength 4/5 in all extremities. Moves all extremities. Psych: Flat affect.    Labs reviewed: CBGs:162, 129, 121 WBCs 13.0, Hgb 9.3 APTT 35, PT/INR 14.6/1.1 Mag 1.6, creatinine 1.13  Resolved Hospital Problem list    Assessment & Plan:  Subdural hematoma without coma Mental status remained stable.  Awake and answers questions appropriately.  Denies any headaches or blurry visions.  Plan for middle meningeal artery embolization later this afternoon with neurosurgery. --Neurosurgery following, appreciate rec -- MMA embolization later today  HFpEF Severe MR s/p mechanical MVR Echo on 07/22/2020 showed EF 20 to 25%, severely hypokinetic LV wall, moderately elevated PASP, stable MVR with mild regurg.  On room air.  Remains euvolemic on exam. -- Encourage p.o. intake --TTE pending --Continue Coreg --Strict I&O's, daily weights -- Continue to hold Lasix, Imdur and hydralazine --Resume warfarin therapy shortly after MMA embolization  Warfarin-induced coagulopathy Unsure if patient was taking warfarin.  INR has been reversed with Kcentra.  PT/INR 14.6/1.1 today. --Trend PT/INR --Warfarin on hold for now  COVID-19 positive Incidental finding on admission. On room air.    --Continue precautions --CTM  AKI on CKD3B Baseline creatinine around 1.3-1.4. Mild AKI likely due to decreased fluid intake.  Creatinine back to baseline. --Gentle fluid hydration --Encourage p.o. intake --Avoid nephrotoxic's agent  HTN HLD SBP remains stable in the 110s-130s.  LDL goal at 65. --Continue Coreg  --Continue on atorvastatin --Hold Imdur and hydralazine as above   Protein calorie malnutrition Functional  decline Tolerating diet so far. Continue Ensure --RD following, appreciate rec  Hyperglycemia A1c 5.4.  CBGs remained stable and at goal. -- SSI, moderate --CBG monitoring   GOC / Dispo Some concerns about patient's living situation and ability to care for self.  Son and daughter-in-law has agreed to take patient back to [place] to live with them after discharge.  -- TOC following  Best Practice (right click and "Reselect all SmartList Selections" daily)   Diet/type: Regular consistency (see orders) DVT prophylaxis: not indicated GI prophylaxis: N/A Lines: N/A Foley:  N/A Code Status:  full code Last date of multidisciplinary goals of care discussion [pending]  Labs   CBC: Recent Labs  Lab 05/05/21 1300 05/06/21 0504 05/07/21 0042  WBC 11.2* 13.6* 13.0*  NEUTROABS 9.5*  --   --   HGB 10.4* 9.4* 9.3*  HCT 32.1* 30.0* 29.0*  MCV 100.0 98.0 98.6  PLT 302 281 253    Basic Metabolic Panel: Recent Labs  Lab 05/05/21 1300 05/06/21 0504 05/07/21 0042  NA 139 140 138  K 4.2 3.7 3.8  CL 101 104 105  CO2 27 28 26   GLUCOSE 181* 219* 150*  BUN 29* 32* 30*  CREATININE 1.39* 1.46* 1.13*  CALCIUM 8.7* 8.3* 8.1*  MG  --   --  1.6*   GFR: Estimated Creatinine Clearance: 34.5 mL/min (A) (by C-G formula based on SCr of 1.13 mg/dL (H)). Recent Labs  Lab 05/05/21 1300 05/06/21 0504 05/07/21 0042  WBC 11.2* 13.6* 13.0*  LATICACIDVEN 2.8*  --   --     Liver Function Tests: Recent Labs  Lab 05/05/21 1300  AST 40  ALT 24  ALKPHOS 65  BILITOT 1.2  PROT 6.3*  ALBUMIN 3.2*   No results for input(s): LIPASE, AMYLASE in the last 168 hours. Recent Labs  Lab 05/05/21 1300  AMMONIA 25    ABG    Component Value Date/Time   HCO3 28.2 (H) 12/07/2006 1925   TCO2 28 05/10/2011 1420     Coagulation Profile: Recent Labs  Lab 05/05/21 1320 05/05/21 1752 05/05/21 2309 05/06/21 0504 05/07/21 0042  INR >10.0* 1.5* 1.3* 1.1 1.1    Cardiac Enzymes: Recent Labs   Lab 05/05/21 1300  CKTOTAL 1,011*    HbA1C: Hgb A1c MFr Bld  Date/Time Value Ref Range Status  05/06/2021 09:06 AM 5.4 4.8 - 5.6 % Final    Comment:    (NOTE)         Prediabetes: 5.7 - 6.4         Diabetes: >6.4         Glycemic control for adults with diabetes: <7.0   12/04/2014 12:12 PM 5.9 (H) 4.8 - 5.6 % Final    Comment:    (NOTE)         Pre-diabetes: 5.7 - 6.4         Diabetes: >6.4         Glycemic control for adults with diabetes: <7.0     CBG: Recent Labs  Lab 05/06/21 0352 05/06/21 0822 05/06/21 1213 05/06/21 1602 05/06/21 1951  GLUCAP 214* 169* 246* 162* 129*     Critical care time: 30

## 2021-05-07 NOTE — Consult Note (Signed)
Palliative Medicine Inpatient Consult Note  Consulting Provider: Jolaine Artist, MD  Reason for consult:   [place] Palliative Medicine Consult  Reason for Consult? GOC   HPI:  Per intake H&P --> 76 year old female with prior history significant for HFrEF (4/22 EF 20-25%), AICD, HTN, congential MR s/p MVR [facility] [facility] on coumadin, HLD, DM, and anemia who presented to [facility] ER on 1/25 by EMS with altered mental status and weakness.  Palliative care has been asked to get involved in the setting of declined functional state, medication non-compliance, and chronic disease burden to further discuss goals of care.  Clinical Assessment/Goals of Care:  *Please note that this is a verbal dictation therefore any spelling or grammatical errors are due to the "[place] One" system interpretation.  I have reviewed medical records including EPIC notes, labs and imaging, received report from bedside RN, assessed the patient who is resting in in bed in NAD. Gotten OOB this morning to the commode. Reviewed her life briefly. Verified I could call her son for additional conversations this morning as she got confused during conversation. ______________________   I called patients son, Etta Grandchild. Etta Grandchild expresses the desire to meet this afternoon at [facility]. _______________________  Dr. Coy Saunas and myself met with patients son, Etta Grandchild and daughter in law this afternoon.   I met with  to further discuss diagnosis prognosis, GOC, EOL wishes, disposition and options.   I introduced Palliative Medicine as specialized medical care for people living with serious illness. It focuses on providing relief from the symptoms and stress of a serious illness. The goal is to improve quality of life for both the patient and the family.  Medical History Review and Understanding:  Reviewed Elianie's history of possibly an underlying undiagnosed dementia, severe heart failure with an AICD, a prior MVR  nineteen years ago.   We discussed the progression of patients heart failure evident by her lack of activity tolerance.  Reviewed patients recent non-medical compliance of unclear reason though thought to be related to something she had been told at a follow up appointment, increased falls, more social isolation.   Social History:  Quiera is from [place], [place]. She has one son, Etta Grandchild. 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 She loved spending time walking. She was able to use pulic transportation regularly. She is quite faithful and practices within the [facility] denomination.  Functional and Nutritional State:  She was previously independent and walked to the store has been slowly declining over the past years. Her most recent decline has been marked since November of this year. Her son shares that their daughter had stayed with her for two week periods of time in September and November whereby she was noted to have multiple items of perished food that she was still eating. She was also taking medications such as hydrocodone and a muscle relaxer which had been expired for many years.  Functionally she has suffered from multiple falls.  Shakaria has lost quite a bit of weight over the past few months.  Advance Directives: A detailed discussion was had today regarding advanced directives.  Patient has non on filed though her family is interested in completing these. They express concerns regarding multiple family members who may have disagreement with their thoughts on how Edee needs to be cared for.   Code Status: Concepts specific to code status, artifical  feeding and hydration, continued IV antibiotics and rehospitalization was had.  We reviewed the importance of considering cardiopulmonary resuscitation status. We reviewed that she presently has an AICD in place and this would be  something to consider as it would continue to shock her heart if it were at a point where it was trying to stop. We also reviewed the other reasons/causes of respiratory and cardiac arrest. Reviewed whether or not in Shakara's frail state we would be benefiting her by pursuing   The difference between a aggressive medical intervention path  and a palliative comfort care path for this patient at this time was had.   Values and goals of care important to patient and family were attempted to be elicited.  Provided "Hard Choices for Loving People" booklet and a MOST for for review.   Goals for the Future:  At this time patients son would like to see if Deshara could rehabilitate. He shares that ideally she would get stronger for transition to [place]. Patient son and daughter in law realize that she may not thrive at skilled nursing and if that is the case they would be open to bringing her home with hospice care. For now though, Etta Grandchild would like to see what improvements she can make.  Discussed the importance of continued conversation with family and their  medical providers regarding overall plan of care and treatment options, ensuring decisions are within the context of the patients values and GOCs.  Decision Maker: Fayne Mediate (son) [phone]  SUMMARY OF RECOMMENDATIONS   FULL CODE --> For the time being. Patients son and daughter will review the MOST form and further discuss with [place] though I shared she couldn't quite grasp the concept when we reviewed this  Discussed AICD and whether or not we should keep this activated  Patients son would like patient to maintain on continued medication management for heart failure as it stands presently  Plan for Neurosurgery to pursue an MMA embolization later today  Plan for ideally a short term skilled stay then transition to sons home in [place] - reviewed the "what if's" of this process not going well   Broached the topic of hospice  care  Appreciate Chaplain to help with advance directives  Ongoing goals of care conversations  Code Status/Advance Care Planning: FULL CODE  Palliative Prophylaxis:  Aspiration, Bowel Regimen, Delirium Protocol, Frequent Pain Assessment, Oral Care, Palliative Wound Care, and Turn Reposition  Additional Recommendations (Limitations, Scope, Preferences): Treat what is treatable  Psycho-social/Spiritual:  Desire for further Chaplaincy support: Yes Additional Recommendations: Education on heart failure progression, dementia, failure to thrive   Prognosis: Unclear presently though given patient recent clinical decline it would not be unexpected for her to continue to deteriorate. Family is hopeful having appropriate support may change this trajectory.   Discharge Planning: Discharge will be to skilled nursing.   Vitals:   05/07/21 0500 05/07/21 0600  BP: 111/69 114/71  Pulse: 68 67  Resp: 19 19  Temp:    SpO2: 93% 97%    Intake/Output Summary (Last 24 hours) at 05/07/2021 0650 Last data filed at 05/06/2021 4259 Gross per 24 hour  Intake 3 ml  Output --  Net 3 ml   Last Weight  Most recent update: 05/07/2021  6:48 AM    Weight  61.4 kg (135 lb 5.8 oz)            Gen:  Elderly frail AA F in NAD HEENT: moist mucous membranes CV: Regular rate and  rhythm  PULM:  On RA breathing is even and nonlabored ABD: soft/nontender  EXT: No edema, (+) LUB bruise Neuro: Alert and oriented x1-2 - gets easily disoriented  PPS: 50%   This conversation/these recommendations were discussed with patient primary care team, Dr. Coy Saunas  Total Time: 117  MDM High  Medical Decision Making:4 #/Complex Problems:4                      Data Reviewed: 4             Management:4 (1-Straightforward, 2-Low, 3-Moderate, 4-High) ______________________________________________________ [facility] Team Team Cell Phone: [phone] Please utilize secure  chat with additional questions, if there is no response within 30 minutes please call the above phone number  Palliative Medicine Team providers are available by phone from 7am to 7pm daily and can be reached through the team cell phone.  Should this patient require assistance outside of these hours, please call the patient's attending physician.

## 2021-05-07 NOTE — Progress Notes (Addendum)
Advanced Heart Failure Rounding Note  PCP-Cardiologist: Glori Bickers, MD   Subjective:    Going for MMA embolization later today.  Holding anticoagulation until after procedure.   Awake this am sitting up in chair. No complaints.   BP stable.    Objective:   Weight Range: 61.4 kg Body mass index is 23.98 kg/m.   Vital Signs:   Temp:  [97.4 F (36.3 C)-98.4 F (36.9 C)] 98.4 F (36.9 C) (01/27 0746) Pulse Rate:  [60-108] 81 (01/27 0915) Resp:  [17-30] 21 (01/27 0915) BP: (97-130)/(48-87) 125/69 (01/27 0908) SpO2:  [92 %-97 %] 95 % (01/27 0915) Weight:  [61.4 kg] 61.4 kg (01/27 0600) Last BM Date:  (PTA)  Weight change: Filed Weights   05/05/21 1241 05/07/21 0600  Weight: 67.9 kg 61.4 kg    Intake/Output:   Intake/Output Summary (Last 24 hours) at 05/07/2021 0944 Last data filed at 05/07/2021 0908 Gross per 24 hour  Intake 104.18 ml  Output --  Net 104.18 ml      Physical Exam    General:  Elderly AAF. Thin. No distress. Sitting up in chair. HEENT: Normal Neck: Supple. No JVD. Carotids 2+ bilat; no bruits.  Cor: PMI nondisplaced. Regular rate & rhythm. No rubs, gallops or murmurs. S1, mechanical S2. Lungs: Clear Abdomen: Soft, nontender, nondistended. No hepatosplenomegaly. No bruits or masses. Good bowel sounds. Extremities: No cyanosis, clubbing, rash, edema Neuro: Alert & orientedx3, cranial nerves grossly intact. moves all 4 extremities w/o difficulty. Affect pleasant   Telemetry   NSR 60s-70s, ~ 5 PVCs/min  Labs    CBC Recent Labs    05/05/21 1300 05/06/21 0504 05/07/21 0042  WBC 11.2* 13.6* 13.0*  NEUTROABS 9.5*  --   --   HGB 10.4* 9.4* 9.3*  HCT 32.1* 30.0* 29.0*  MCV 100.0 98.0 98.6  PLT 302 281 672   Basic Metabolic Panel Recent Labs    05/06/21 0504 05/07/21 0042  NA 140 138  K 3.7 3.8  CL 104 105  CO2 28 26  GLUCOSE 219* 150*  BUN 32* 30*  CREATININE 1.46* 1.13*  CALCIUM 8.3* 8.1*  MG  --  1.6*   Liver  Function Tests Recent Labs    05/05/21 1300  AST 40  ALT 24  ALKPHOS 65  BILITOT 1.2  PROT 6.3*  ALBUMIN 3.2*   No results for input(s): LIPASE, AMYLASE in the last 72 hours. Cardiac Enzymes Recent Labs    05/05/21 1300  CKTOTAL 1,011*    BNP: BNP (last 3 results) Recent Labs    01/15/21 1121  BNP 614.5*    ProBNP (last 3 results) No results for input(s): PROBNP in the last 8760 hours.   D-Dimer No results for input(s): DDIMER in the last 72 hours. Hemoglobin A1C Recent Labs    05/06/21 0906  HGBA1C 5.4   Fasting Lipid Panel Recent Labs    05/06/21 0930  CHOL 121  HDL 43  LDLCALC 65  TRIG 64  CHOLHDL 2.8   Thyroid Function Tests Recent Labs    05/06/21 1027  TSH 0.457    Other results:   Imaging    No results found.   Medications:     Scheduled Medications:  atorvastatin  40 mg Oral Daily   carvedilol  12.5 mg Oral BID WC   Chlorhexidine Gluconate Cloth  6 each Topical Daily   feeding supplement (GLUCERNA SHAKE)  237 mL Oral TID BM   hydrALAZINE  25 mg Oral TID  insulin aspart  0-15 Units Subcutaneous TID WC   isosorbide mononitrate  30 mg Oral Daily   pantoprazole  40 mg Oral Daily   sodium chloride flush  3 mL Intravenous Q12H    Infusions:   PRN Medications: acetaminophen, ondansetron (ZOFRAN) IV    Patient Profile   76 y.o. female with history of chronic systolic CHF, NICM with EF 20-25% s/p ICD, hx mechanical mitral valve, hx GI bleed. Admitted with SDH in setting of supratherapeutic INR.    Assessment/Plan   SDH: -B/l, appear larger on CT head today -Neuro planning meningeal artery embolization today -In setting of supratherapeutic INR. INR >10 on admit. Uncertain if taking coumadin as prescribed +/- malnutrition. Given Kcentra. INR down to 1.1. Missed INR appointment on 12/30.  -? Falls at home   2. Chronic systolic CHF/NICM: - nonischemic cardiomyopathy, ejection fraction 25% s/p St Jude single chamber ICD  in 2009  - EF 20-25% in 11/2014 - Echo 08/2016. EF 15% - Echo 06/2018 EF 20-25%  - 07/2020 EF 20-25%, RWMA, normal functioning mechanical mitral valve.  Moderately elevated PASP - with RWMA it is possible she has developed CAD since 2009 but denies any chest discomfort - Volume status stable on exam. Prescribed furosemide at home but not taking d/t urinary frequency. Does not need diuretic at this point.  - Continue coreg 12.5 mg BID - Continue imdur 30 mg daily and hydralazine 25 mg TID - Previously stopped entresto d/t worsening creatinine and hyperkalemia - Not on SGLT2i d/t UTIs - Echo pending   3. Mitral valve disease s/p mechanical MVR in 2005: - On coumadin, compliance not certain as above.  - anticoagulation on hold in view of SDH and anticipation of middle meningeal artery embolization. Resume AC as soon as okay from bleeding perspective. Appreciate NSU input. - MV functioning normally on last echo in 04/22 - Repeat echo pending   4. CKD IIIb: - Scr baseline around 1.3 - Stable at 1.13 today   5. COVID positive: - Appears asymptomatic - O2 stable on RA   6. Hx Iron deficiency anemia: -upper GI bleed 04/22 in the setting of supratherapeutic INR and H. Pylori  -iron infusion in 06/22 -Hgb 10.4>9.4  7. Hypomagnesemia: - Mag 1.6 - Supp  Concerned about her overall decline within the last few months to year. She's missed multiple office appointments. She has lost a significant amount of weight and worry about her ability to live independently and take her medicines on her own after discharge.  This was discussed with her son and daughter-in-law last night. Consulted Palliative Care to assist with goals of care discussion.  Length of Stay: 2  [name], Deborah Parents, PA-C  05/07/2021, 9:44 AM  Advanced Heart Failure Team Pager [phone] (M-F; 7a - 5p)  Please contact [facility] for night-coverage after hours (5p -7a ) and weekends on [email or web address]    Patient seen and examined  with the above-signed Advanced Practice Provider and/or Housestaff. I personally reviewed laboratory data, imaging studies and relevant notes. I independently examined the patient and formulated the important aspects of the plan. I have edited the note to reflect any of my changes or salient points. I have personally discussed the plan with the patient and/or family.  Remains alert and nonfocal. Denies CP or SOB. Off AC.   Going for MMA embolization today  General:  Sitting in chair Weak appearing. Thin No resp difficulty HEENT: normal Neck: supple. no JVD. Carotids 2+ bilat; no bruits. No lymphadenopathy  or thryomegaly appreciated. Cor: PMI nondisplaced. Regular rate & rhythm. Mechanical s1 Lungs: clear Abdomen: soft, nontender, nondistended. No hepatosplenomegaly. No bruits or masses. Good bowel sounds. Extremities: no cyanosis, clubbing, rash, edema Neuro: alert & orientedx3, cranial nerves grossly intact. moves all 4 extremities w/o difficulty. Affect pleasant  Plan for bilateral MMA embolization today. Remains stable from HF perspective. Resume AC as soon as possible from bleeding perspective.   Palliative Care met with patient and family today. Remains full code with plan to d/c to SNF followed by possibly relocating to live with her son in [place]. I do not believe she can return home alone.   HF team will see again Monday. Please call over the weekend with questions.   Glori Bickers, MD  5:18 PM

## 2021-05-07 NOTE — Anesthesia Preprocedure Evaluation (Addendum)
Anesthesia Evaluation  Patient identified by MRN, date of birth, ID band  Reviewed: Allergy & Precautions, NPO status , Patient's Chart, lab work & pertinent test results, reviewed documented beta blocker date and time , Unable to perform ROS - Chart review only  Airway Mallampati: II  TM Distance: >3 FB Neck ROM: Full    Dental  (+) Dental Advisory Given, Poor Dentition, Missing, Partial Upper, Partial Lower   Pulmonary COPD, former smoker,    Pulmonary exam normal breath sounds clear to auscultation       Cardiovascular hypertension, Pt. on home beta blockers and Pt. on medications pulmonary hypertension+CHF  + Cardiac Defibrillator + Valvular Problems/Murmurs  Rhythm:Regular Rate:Normal + Systolic murmurs Echo 05/07/2021 1. Left ventricular ejection fraction, by estimation, is <20%. The left ventricle has severely decreased function. The left ventricle demonstrates global hypokinesis. The left ventricular internal cavity size was severely dilated. Left ventricular diastolic function could not be evaluated.  2. Right ventricular systolic function is moderately reduced. The right ventricular size is normal. There is moderately elevated pulmonary artery systolic pressure. The estimated right ventricular systolic pressure is  58.1 mmHg.  3. Left atrial size was severely dilated.  4. Right atrial size was moderately dilated.  5. There is a trivial pericardial effusion \ posterior to the left ventricle.  6. The mitral valve has been repaired/replaced. Cannot assess for MR due to LA shadowing from the mechanical MVR mitral valve regurgitation. No evidence of mitral stenosis. The mean mitral valve gradient is 2.5 mmHg.  7. Tricuspid valve regurgitation is moderate.  8. The aortic valve is tricuspid. Aortic valve regurgitation is not visualized. Aortic valve sclerosis/calcification is present, without any evidence of aortic stenosis.  Aortic valve Vmax measures 1.37 m/s.  9. The inferior vena cava is dilated in size with >50% respiratory variability, suggesting right atrial pressure of 8 mmHg.    Echo 07/22/2020 1. Left ventricular ejection fraction, by estimation, is 20 to 25%. The left ventricle has severely decreased function. The left ventricle demonstrates regional wall motion abnormalities.      All septal segments and the basal inferior segment appear akinetic. The rest of the LV walls appears severely hypokinetic. The left ventricular internal cavity size was moderately dilated. Diastolic function indeterminant due to mechanical mitral valve.  2. Right ventricular systolic function is normal. The right ventricular size is normal. There is moderately elevated pulmonary artery systolic pressure. The estimated right ventricular systolic pressure is 45.8 mmHg.  3. Left atrial size was severely dilated.  4. The mitral valve has been repaired/replaced. A mechanical St. Jude mitral valve is present and appears well seated with normal function by doppler interrogation. Mean gradient at HR 86bpm. There is trivial mitral regurgitation.  5. The aortic valve is tricuspid. There is mild thickening of the aortic valve. Aortic valve regurgitation is not visualized. Mild aortic valve sclerosis is present, with no evidence of aortic valve stenosis.  6. The inferior vena cava is normal in size with greater than 50% respiratory variability, suggesting right atrial pressure of 3 mmHg.     Neuro/Psych negative neurological ROS     GI/Hepatic Neg liver ROS, PUD,   Endo/Other  diabetes  Renal/GU Renal disease     Musculoskeletal negative musculoskeletal ROS (+)   Abdominal   Peds  Hematology  (+) Blood dyscrasia, anemia ,   Anesthesia Other Findings   Reproductive/Obstetrics  Anesthesia Physical  Anesthesia Plan  ASA: 4  Anesthesia Plan: General    Post-op Pain Management: Minimal or no pain anticipated   Induction: Intravenous  PONV Risk Score and Plan: 3 and Treatment may vary due to age or medical condition and Ondansetron  Airway Management Planned: Oral ETT  Additional Equipment: Arterial line  Intra-op Plan:   Post-operative Plan: Possible Post-op intubation/ventilation  Informed Consent: I have reviewed the patients History and Physical, chart, labs and discussed the procedure including the risks, benefits and alternatives for the proposed anesthesia with the patient or authorized representative who has indicated his/her understanding and acceptance.     Dental advisory given  Plan Discussed with: CRNA  Anesthesia Plan Comments:        Anesthesia Quick Evaluation

## 2021-05-07 NOTE — Progress Notes (Signed)
She looks the best today that she has looked.  She is awake and alert and oriented.  She is moving all extremities.  Dr. Conchita Paris and I have discussed the case and we feel like the most appropriate initial measure is the Lipitor and MMA embolization and I believe the goal is to achieve that today.  Obviously this will not reduce the acute component of the subdural at this time, but hopefully this will help reduce the chronic component without surgery and the acute component can liquefy into a more chronic component that can be reabsorbed hopefully.  Our goal here is to offer the most benefit with the least risk of harm.  Difficult situation.  Would like for her to get back on her anticoagulant as quickly as possible.  We feel this can be resumed very quickly after her MMA embolization.

## 2021-05-07 NOTE — Anesthesia Procedure Notes (Signed)
Arterial Line Insertion Start/End1/27/2023 5:53 PM Performed by: Dairl Ponder, CRNA  Patient location: Pre-op. Preanesthetic checklist: patient identified, IV checked, site marked, risks and benefits discussed, surgical consent, monitors and equipment checked, pre-op evaluation, timeout performed and anesthesia consent Lidocaine 1% used for infiltration Left, radial was placed Catheter size: 20 G Hand hygiene performed  and maximum sterile barriers used   Attempts: 1 Procedure performed without using ultrasound guided technique. Following insertion, dressing applied. Post procedure assessment: normal and unchanged

## 2021-05-07 NOTE — Brief Op Note (Signed)
°  NEUROSURGERY BRIEF OPERATIVE  NOTE   PREOP DX: Bilateral subdural hematoma  POSTOP DX: Same  PROCEDURE: Diagnostic cerebral angiogram, Onyx embolization of bilateral middle meningeal arteries  SURGEON: Dr. Consuella Lose, MD  ANESTHESIA: GETAl  EBL: Minimal  SPECIMENS: None  COMPLICATIONS: None  CONDITION: Stable to ICU  FINDINGS (Full report in [facility]): 1. Successful Onyx embolization of bilateral middle meningeal arteries.   Consuella Lose, MD [facility]

## 2021-05-07 NOTE — Transfer of Care (Signed)
Immediate Anesthesia Transfer of Care Note  Patient: Deborah [name]  Procedure(s) Performed: IR WITH ANESTHESIA EMBOLIZATION  Patient Location: ICU  Anesthesia Type:General  Level of Consciousness: awake and patient cooperative  Airway & Oxygen Therapy: Patient Spontanous Breathing and Patient connected to nasal cannula oxygen  Post-op Assessment: Report given to RN  Post vital signs: Reviewed and stable  Last Vitals:  Vitals Value Taken Time  BP 117/85 05/07/21 2000  Temp    Pulse 72 05/07/21 2014  Resp 18 05/07/21 2014  SpO2 98 % 05/07/21 2014  Vitals shown include unvalidated device data.  Last Pain:  Vitals:   05/07/21 1600  TempSrc:   PainSc: 0-No pain         Complications: No notable events documented.

## 2021-05-07 NOTE — Progress Notes (Signed)
Subjective: Patient reports no headaches or acute events overnight   Objective: Vital signs in last 24 hours: Temp:  [97.4 F (36.3 C)-98.9 F (37.2 C)] 98.4 F (36.9 C) (01/27 0746) Pulse Rate:  [60-108] 67 (01/27 0600) Resp:  [17-30] 19 (01/27 0600) BP: (97-135)/(48-87) 114/71 (01/27 0600) SpO2:  [91 %-97 %] 97 % (01/27 0600) Weight:  [61.4 kg] 61.4 kg (01/27 0600)  Intake/Output from previous day: 01/26 0701 - 01/27 0700 In: 3 [I.V.:3] Out: -  Intake/Output this shift: No intake/output data recorded.  Neurologic: Grossly normal  Lab Results: Lab Results  Component Value Date   WBC 13.0 (H) 05/07/2021   HGB 9.3 (L) 05/07/2021   HCT 29.0 (L) 05/07/2021   MCV 98.6 05/07/2021   PLT 253 05/07/2021   Lab Results  Component Value Date   INR 1.1 05/07/2021   BMET Lab Results  Component Value Date   NA 138 05/07/2021   K 3.8 05/07/2021   CL 105 05/07/2021   CO2 26 05/07/2021   GLUCOSE 150 (H) 05/07/2021   BUN 30 (H) 05/07/2021   CREATININE 1.13 (H) 05/07/2021   CALCIUM 8.1 (L) 05/07/2021    Studies/Results: DG Chest 2 View  Result Date: 05/05/2021 CLINICAL DATA:  Altered mental status, weakness. EXAM: CHEST - 2 VIEW COMPARISON:  July 20, 2020 FINDINGS: Left chest AICD with lead projecting over the right ventricle. Similar cardiomegaly. Prior median sternotomy with unchanged fracture of the superior most wire. No focal airspace consolidation. No visible pleural effusion or pneumothorax. Thoracic spondylosis with degenerative changes shoulders. IMPRESSION: Unchanged cardiomegaly without overt pulmonary edema or focal airspace consolidation. Electronically Signed   By: Maudry Mayhew M.D.   On: 05/05/2021 13:48   CT HEAD WO CONTRAST ( )  Result Date: 05/06/2021 CLINICAL DATA:  Follow-up subdural hematoma EXAM: CT HEAD WITHOUT CONTRAST TECHNIQUE: Contiguous axial images were obtained from the base of the skull through the vertex without intravenous contrast.  RADIATION DOSE REDUCTION: This exam was performed according to the departmental dose-optimization program which includes automated exposure control, adjustment of the mA and/or kV according to patient size and/or use of iterative reconstruction technique. COMPARISON:  Head CT from yesterday FINDINGS: Brain: Bilateral mixed density subdural hematoma encompassing the cerebral convexities. Greater anterior low-density component bilaterally which may be from increasing hygroma component or layering of the clot. Maximal thickness on the left is 14 mm and on the right is 13 mm. These low-density components appear somewhat increased with further bifrontal mass effect. No hydrocephalus or complicating infarct. Chronic small vessel ischemia. Vascular: Negative Skull: No acute finding Sinuses/Orbits: No acute finding IMPRESSION: Extensive bilateral subdural hematoma with mixed density. The low-density components have increased with further bifrontal mass effect as described. Electronically Signed   By: Tiburcio Pea M.D.   On: 05/06/2021 06:02   CT HEAD WO CONTRAST  Result Date: 05/05/2021 CLINICAL DATA:  Found down. Altered mental status. Generalized weakness for 2-3 months. EXAM: CT HEAD WITHOUT CONTRAST CT CERVICAL SPINE WITHOUT CONTRAST TECHNIQUE: Multidetector CT imaging of the head and cervical spine was performed following the standard protocol without intravenous contrast. Multiplanar CT image reconstructions of the cervical spine were also generated. RADIATION DOSE REDUCTION: This exam was performed according to the departmental dose-optimization program which includes automated exposure control, adjustment of the mA and/or kV according to patient size and/or use of iterative reconstruction technique. COMPARISON:  None. FINDINGS: CT HEAD FINDINGS Brain: There are acute hemispheric subdural hematomas bilaterally. On the right, the hematoma measures up to  14 mm in thickness over the frontal lobe. On the left, the  hematoma measures up to 11 mm in thickness over the frontal lobe. There is a posterior interhemispheric component as well which measures up to 11 mm in thickness. There are high density components and fluid-fluid levels associated with the subdural hematomas. Mass effect is present on the right frontal lobe, although no midline shift or hydrocephalus is seen. There is no evidence of intraparenchymal hemorrhage or definite acute stroke. Vascular: Intracranial vascular calcifications. No hyperdense vessel identified. Skull: Calvarial hyperostosis without acute abnormality. Sinuses/Orbits: The visualized paranasal sinuses and mastoid air cells are clear. No orbital abnormalities are seen. Other: None. CT CERVICAL SPINE FINDINGS Alignment: Reversal of lordosis without focal angulation or listhesis. Skull base and vertebrae: No evidence of acute cervical spine fracture or traumatic subluxation. Soft tissues and spinal canal: No prevertebral fluid or swelling. No visible canal hematoma. Disc levels: Multilevel cervical spondylosis with uncinate spurring. Loss of disc height is greatest at C6-7. No large disc herniation identified. Upper chest: Clear lung apices. 1.9 cm low-density right thyroid nodule. Other: None. IMPRESSION: 1. Sizable acute hemispheric subdural hematomas bilaterally with posterior interhemispheric extension. There is mass effect on the right frontal lobe, but no midline shift or hydrocephalus. Neurosurgical consultation recommended. 2. No evidence of intraparenchymal hemorrhage or acute stroke. 3. No evidence of acute cervical spine fracture, traumatic subluxation or static signs of instability. 4. Cervical spondylosis as described. 5. 1.9 cm incidental right thyroid nodule. Recommend thyroid US unless contraindicated by patient morbidities. Reference: J Am Coll Radiol. 2015 Feb;12(2): 143-50 6. Critical Value/emergent results were called by telephone at the time of interpretation on 05/05/2021 at 2:12  pm to provider ABIGAIL HARRIS , who verbally acknowledged these results. Electronically Signed   By: Carey Bullocks M.D.   On: 05/05/2021 14:17   CT CERVICAL SPINE WO CONTRAST  Result Date: 05/05/2021 CLINICAL DATA:  Found down. Altered mental status. Generalized weakness for 2-3 months. EXAM: CT HEAD WITHOUT CONTRAST CT CERVICAL SPINE WITHOUT CONTRAST TECHNIQUE: Multidetector CT imaging of the head and cervical spine was performed following the standard protocol without intravenous contrast. Multiplanar CT image reconstructions of the cervical spine were also generated. RADIATION DOSE REDUCTION: This exam was performed according to the departmental dose-optimization program which includes automated exposure control, adjustment of the mA and/or kV according to patient size and/or use of iterative reconstruction technique. COMPARISON:  None. FINDINGS: CT HEAD FINDINGS Brain: There are acute hemispheric subdural hematomas bilaterally. On the right, the hematoma measures up to 14 mm in thickness over the frontal lobe. On the left, the hematoma measures up to 11 mm in thickness over the frontal lobe. There is a posterior interhemispheric component as well which measures up to 11 mm in thickness. There are high density components and fluid-fluid levels associated with the subdural hematomas. Mass effect is present on the right frontal lobe, although no midline shift or hydrocephalus is seen. There is no evidence of intraparenchymal hemorrhage or definite acute stroke. Vascular: Intracranial vascular calcifications. No hyperdense vessel identified. Skull: Calvarial hyperostosis without acute abnormality. Sinuses/Orbits: The visualized paranasal sinuses and mastoid air cells are clear. No orbital abnormalities are seen. Other: None. CT CERVICAL SPINE FINDINGS Alignment: Reversal of lordosis without focal angulation or listhesis. Skull base and vertebrae: No evidence of acute cervical spine fracture or traumatic  subluxation. Soft tissues and spinal canal: No prevertebral fluid or swelling. No visible canal hematoma. Disc levels: Multilevel cervical spondylosis with uncinate spurring. Loss of  disc height is greatest at C6-7. No large disc herniation identified. Upper chest: Clear lung apices. 1.9 cm low-density right thyroid nodule. Other: None. IMPRESSION: 1. Sizable acute hemispheric subdural hematomas bilaterally with posterior interhemispheric extension. There is mass effect on the right frontal lobe, but no midline shift or hydrocephalus. Neurosurgical consultation recommended. 2. No evidence of intraparenchymal hemorrhage or acute stroke. 3. No evidence of acute cervical spine fracture, traumatic subluxation or static signs of instability. 4. Cervical spondylosis as described. 5. 1.9 cm incidental right thyroid nodule. Recommend thyroid US unless contraindicated by patient morbidities. Reference: J Am Coll Radiol. 2015 Feb;12(2): 143-50 6. Critical Value/emergent results were called by telephone at the time of interpretation on 05/05/2021 at 2:12 pm to provider ABIGAIL HARRIS , who verbally acknowledged these results. Electronically Signed   By: Carey Bullocks M.D.   On: 05/05/2021 14:17    Assessment/Plan: 76 year old who came in with bilateral SDH. Her CT scan yesterday showed increase in size of her SDH's. Today she is doing really well, she is oriented x4 and having appropriate conversation. Dr. Conchita Paris will be taking her to IR today for MMA embolization. Hold blood thinners for now.    LOS: 2 days    Tiana Loft [facility] 05/07/2021, 7:51 AM

## 2021-05-07 NOTE — Progress Notes (Signed)
Echocardiogram 2D Echocardiogram has been performed.  Deborah [name] 05/07/2021, 2:23 PM

## 2021-05-08 ENCOUNTER — Inpatient Hospital Stay (HOSPITAL_COMMUNITY): Payer: Medicare Other

## 2021-05-08 DIAGNOSIS — I5022 Chronic systolic (congestive) heart failure: Secondary | ICD-10-CM | POA: Diagnosis not present

## 2021-05-08 DIAGNOSIS — S065XAA Traumatic subdural hemorrhage with loss of consciousness status unknown, initial encounter: Secondary | ICD-10-CM | POA: Diagnosis not present

## 2021-05-08 DIAGNOSIS — R627 Adult failure to thrive: Secondary | ICD-10-CM | POA: Diagnosis not present

## 2021-05-08 DIAGNOSIS — N179 Acute kidney failure, unspecified: Secondary | ICD-10-CM | POA: Diagnosis not present

## 2021-05-08 LAB — GLUCOSE, CAPILLARY
Glucose-Capillary: 78 mg/dL (ref 70–99)
Glucose-Capillary: 197 mg/dL — ABNORMAL HIGH (ref 70–99)
Glucose-Capillary: 185 mg/dL — ABNORMAL HIGH (ref 70–99)
Glucose-Capillary: 99 mg/dL (ref 70–99)

## 2021-05-08 LAB — CBC
HCT: 26.2 % — ABNORMAL LOW (ref 36.0–46.0)
Hemoglobin: 8.5 g/dL — ABNORMAL LOW (ref 12.0–15.0)
MCH: 32.4 pg (ref 26.0–34.0)
MCHC: 32.4 g/dL (ref 30.0–36.0)
MCV: 100 fL (ref 80.0–100.0)
Platelets: 237 10*3/uL (ref 150–400)
RBC: 2.62 MIL/uL — ABNORMAL LOW (ref 3.87–5.11)
RDW: 13.6 % (ref 11.5–15.5)
WBC: 9.5 10*3/uL (ref 4.0–10.5)
nRBC: 0 % (ref 0.0–0.2)

## 2021-05-08 LAB — BASIC METABOLIC PANEL
Anion gap: 8 (ref 5–15)
BUN: 19 mg/dL (ref 8–23)
CO2: 26 mmol/L (ref 22–32)
Calcium: 8 mg/dL — ABNORMAL LOW (ref 8.9–10.3)
Chloride: 107 mmol/L (ref 98–111)
Creatinine, Ser: 0.9 mg/dL (ref 0.44–1.00)
GFR, Estimated: >60 mL/min (ref >60)
Glucose, Bld: 107 mg/dL — ABNORMAL HIGH (ref 70–99)
Potassium: 3.9 mmol/L (ref 3.5–5.1)
Sodium: 141 mmol/L (ref 135–145)

## 2021-05-08 LAB — PROTIME-INR
INR: 1.2 (ref 0.8–1.2)
Prothrombin Time: 15.5 seconds — ABNORMAL HIGH (ref 11.4–15.2)

## 2021-05-08 LAB — MAGNESIUM: Magnesium: 2.2 mg/dL (ref 1.7–2.4)

## 2021-05-08 LAB — HEPARIN LEVEL (UNFRACTIONATED): Heparin Unfractionated: <0.1 IU/mL — ABNORMAL LOW (ref 0.30–0.70)

## 2021-05-08 MED ORDER — HEPARIN (PORCINE) 25000 UT/250ML-% IV SOLN
1000.0000 [IU]/h | INTRAVENOUS | Status: DC
  Administered 2021-05-08: 600 [IU]/h via INTRAVENOUS
  Administered 2021-05-09: 1000 [IU]/h via INTRAVENOUS
  Administered 2021-05-11: 1050 [IU]/h via INTRAVENOUS
  Filled 2021-05-08 (×3): qty 250

## 2021-05-08 MED ORDER — WARFARIN - PHARMACIST DOSING INPATIENT: Freq: Every day | Status: DC

## 2021-05-08 NOTE — Progress Notes (Signed)
ANTICOAGULATION CONSULT NOTE - Initial Consult  Pharmacy Consult for Heparin IV Indication:  Mechanical valve  Allergies  Allergen Reactions   Strawberry Extract Rash    Rash & itching.    Patient Measurements: Height: 5\' 3"  (160 cm) Weight: 61.4 kg (135 lb 5.8 oz) IBW/kg (Calculated) : 52.4 Heparin Dosing Weight: 61.4 kg  Vital Signs: Temp: 97.4 F (36.3 C) (01/28 1215) Temp Source: Oral (01/28 1215) BP: 111/58 (01/28 1000) Pulse Rate: 72 (01/28 1000)  Labs: Recent Labs    05/05/21 1300 05/05/21 1320 05/05/21 1752 05/05/21 2309 05/06/21 0504 05/07/21 0042 05/08/21 0122  HGB 10.4*  --   --   --  9.4* 9.3* 8.5*  HCT 32.1*  --   --   --  30.0* 29.0* 26.2*  PLT 302  --   --   --  281 253 237  APTT  --   --   --   --   --  35  --   LABPROT  --    < > 18.2*   < > 14.5 14.6 15.5*  INR  --    < > 1.5*   < > 1.1 1.1 1.2  CREATININE 1.39*  --   --   --  1.46* 1.13* 0.90  CKTOTAL 1,011*  --   --   --   --   --   --   TROPONINIHS 58*  --  56*  --   --   --   --    < > = values in this interval not displayed.    Estimated Creatinine Clearance: 43.3 mL/min (by C-G formula based on SCr of 0.9 mg/dL).   Medical History: Past Medical History:  Diagnosis Date   Acute gastric ulcer without hemorrhage or perforation    AICD (automatic cardioverter/defibrillator) present    Aortic stenosis, mild    mean gradient 68mmHg echo 09/09   CHF (congestive heart failure) (HCC)    --Non-ischemic CM EF 30-35% by echo 9/09 (Previous 25%)  --s/p St. Jude single chamber ICD 04/09 --Minimal  CAD by cath 2004 LAD 20% LCX 20% Ramus 30% RCA nl --echo 05/11 EF 20-25%   Diabetes mellitus    type 2   Hyperlipidemia    Hypertension    Kidney stones    MR (congenital mitral regurgitation)    Severe MR s/p St. Jude mechanical MVR by Gerrit Friends 2005    Medications:  Scheduled:   atorvastatin  40 mg Oral Daily   carvedilol  12.5 mg Oral BID WC   Chlorhexidine Gluconate Cloth  6 each Topical  Daily   feeding supplement (GLUCERNA SHAKE)  237 mL Oral TID BM   hydrALAZINE  25 mg Oral TID   insulin aspart  0-15 Units Subcutaneous TID WC   isosorbide mononitrate  30 mg Oral Daily   pantoprazole  40 mg Oral Daily   sodium chloride flush  3 mL Intravenous Q12H   Warfarin - Pharmacist Dosing Inpatient   Does not apply q1600   Infusions: none  Assessment: 76 years of age female with history significant for congenital MR with St Judes mechanical valve on chronic warfarin who was admitted for AMS and weakness found to have INR >10 and bilateral SDH who received reversal of anticoagulation with Kcentra and Vitamin K IV on 1/25 and is now status post Onxy embolization of bilateral middle meningeal arteries on 1/27.   Repeat CT Head this AM is stable. Ok per Neurosurgery to resume anticoagulation with  IV Heparin without boluses. Pharmacy consulted to start IV Heparin (no bolus). Hgb down to 8.5 today. Platelets stable. INR currently at 1.2. No overt signs of bleeding. Patient reported mild headache this AM.   Plan per Neurosurgery - once patient is therapeutic on IV Heparin plan to repeat CT Head and if remains stable, then resume Warfarin.  Goal of Therapy:  Heparin level 0.3 to 0.5 units/ml Monitor platelets by anticoagulation protocol: Yes   Plan:  Start IV Heparin (No bolus) at rate of 600 units/hr. Heparin level in 8 hours.  Daily PT/INR, heparin level, and CBC.  Monitor for any signs or symptoms of bleeding and mental status changes.  Once therapeutic on IV Heparin, follow-up repeat Head CT to start Warfarin. Will need overlap.   Sloan Leiter, PharmD, BCPS, BCCCP Clinical Pharmacist Please refer to [facility] for [place] numbers 05/08/2021,12:44 PM

## 2021-05-08 NOTE — Progress Notes (Signed)
NAME:  Deborah [name], MRN:  [id number], DOB:  Nov 06, 1945, LOS: 3 ADMISSION DATE:  05/05/2021, CONSULTATION DATE: 05/05/21 REFERRING MD: Dr. 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 Judes on coumadin, HLD, DM, and anemia who presented to [facility] ER by EMS with altered mental status and weakness.   Patient lives alone.  Her sister was unable to reach her yesterday or today.  They went to check on her and she was found her on the bathroom floor.  EMS report notes that she stated she had lost control of her bladder and sat on the floor to clean her self but was too weak to get up.  Denied falling, dizziness, syncope, etc.  Also reports not eating/ drinking for 16 hours.  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 Judes on coumadin, HLD, DM, anemia, prior GI bleed   Significant Hospital Events: Including procedures, antibiotic start and stop dates in addition to other pertinent events   1/25 Admitted, NSGY consulted, reversed with Theodoro Parma 1/25 CT head; sizable acute hemispheric subdural hematoma with mass-effect on right frontal lobe 1/26 CT head; progressive bilateral subdural hematoma with increased mass-effect on frontal lobe. 1/27 MMA embolization with nsgy  Interim History / Subjective:  No overnight events. No pain.   Objective   Blood pressure 127/64, pulse (!) 59, temperature (!) 97.5 F (36.4 C), temperature source Oral, resp. rate 18, height 5\' 3"  (1.6 m), weight 61.4 kg, SpO2 98 %.        Intake/Output Summary (Last 24 hours) at 05/08/2021 0925 Last data filed at 05/08/2021 0300 Gross per 24 hour  Intake --  Output 610 ml  Net -610 ml   Filed Weights   05/05/21 1241 05/07/21 0600  Weight: 67.9 kg 61.4 kg    Examination: Well appearing, no  distress Breathing is non labored Mechanical mitral valve auscultated No facial asymmetry Moves all 4 extremities independently and symmetrically   Labs reviewed: Na 141 K 3.9 Cr 0.9 WBC 9.5 Hgb 8.5 INR 1.2 Glucose <180  Echo Jan 27 reviewed - LVEF <20%, global hypokinesis  Resolved Hospital Problem list   Supratherapeutic INR on admission  Assessment & Plan:  Subdural hematoma without coma Mental status remained stable.  Awake and answers questions appropriately.  Denies any headaches or blurry visions.  Plan for middle meningeal artery embolization later this afternoon with neurosurgery. --Neurosurgery following, appreciate rec -- MMA embolization on 1/28  Severe biventricular heart failure Severe MR s/p mechanical MVR Echo on 07/22/2020 showed EF 20 to 25%, severely hypokinetic LV wall, moderately elevated PASP, stable MVR with mild regurg.  On room air.  Remains euvolemic on exam. -- Encourage p.o. intake --Continue Coreg --Strict I&O's, daily weights -- Continue to hold Lasix, Imdur and hydralazine -- discussed with nsgy. Plan is for repeat Head Ct today. If bleeding is stable will be able to resume heparin gtt bridge no bolus and initiate warfarin with goal INR 2.5-3.5 -- will need to remain in ICU for 24 hours after starting [facility].   COVID-19 positive Incidental finding on admission. On room air.    --Continue precautions --CTM  AKI on CKD3B Baseline creatinine around 1.3-1.4. Mild AKI likely due to decreased fluid intake.  Creatinine back to baseline. --Gentle fluid hydration --Encourage p.o. intake --  Avoid nephrotoxic's agent  HTN HLD SBP remains stable in the 110s-130s.  LDL goal at 65. --Continue Coreg  --Continue on atorvastatin --Hold Imdur and hydralazine as above   Protein calorie malnutrition Functional decline Tolerating diet so far. Continue Ensure --RD following, appreciate rec  Hyperglycemia A1c 5.4.  CBGs remained stable and at goal. --  SSI, moderate --CBG monitoring   GOC / Dispo Some concerns about patient's living situation and ability to care for self.  Son and daughter-in-law has agreed to take patient back to [place] to live with them after discharge.  -- TOC following  Best Practice (right click and "Reselect all SmartList Selections" daily)   Diet/type: Regular consistency (see orders) DVT prophylaxis: not indicated GI prophylaxis: N/A Lines: N/A Foley:  N/A Code Status:  full code Last date of multidisciplinary goals of care discussion [1/27 family meeting with palliative care and ccm at bedside. Continue full measures for now. Plan is to help patient transition to home with family out of state]  Labs   CBC: Recent Labs  Lab 05/05/21 1300 05/06/21 0504 05/07/21 0042 05/08/21 0122  WBC 11.2* 13.6* 13.0* 9.5  NEUTROABS 9.5*  --   --   --   HGB 10.4* 9.4* 9.3* 8.5*  HCT 32.1* 30.0* 29.0* 26.2*  MCV 100.0 98.0 98.6 100.0  PLT 302 281 253 237    Basic Metabolic Panel: Recent Labs  Lab 05/05/21 1300 05/06/21 0504 05/07/21 0042 05/08/21 0122  NA 139 140 138 141  K 4.2 3.7 3.8 3.9  CL 101 104 105 107  CO2 27 28 26 26   GLUCOSE 181* 219* 150* 107*  BUN 29* 32* 30* 19  CREATININE 1.39* 1.46* 1.13* 0.90  CALCIUM 8.7* 8.3* 8.1* 8.0*  MG  --   --  1.6* 2.2   GFR: Estimated Creatinine Clearance: 43.3 mL/min (by C-G formula based on SCr of 0.9 mg/dL). Recent Labs  Lab 05/05/21 1300 05/06/21 0504 05/07/21 0042 05/08/21 0122  WBC 11.2* 13.6* 13.0* 9.5  LATICACIDVEN 2.8*  --   --   --     Liver Function Tests: Recent Labs  Lab 05/05/21 1300  AST 40  ALT 24  ALKPHOS 65  BILITOT 1.2  PROT 6.3*  ALBUMIN 3.2*   No results for input(s): LIPASE, AMYLASE in the last 168 hours. Recent Labs  Lab 05/05/21 1300  AMMONIA 25    ABG    Component Value Date/Time   HCO3 28.2 (H) 12/07/2006 1925   TCO2 28 05/10/2011 1420     Coagulation Profile: Recent Labs  Lab 05/05/21 1752  05/05/21 2309 05/06/21 0504 05/07/21 0042 05/08/21 0122  INR 1.5* 1.3* 1.1 1.1 1.2    Cardiac Enzymes: Recent Labs  Lab 05/05/21 1300  CKTOTAL 1,011*    HbA1C: Hgb A1c MFr Bld  Date/Time Value Ref Range Status  05/06/2021 09:06 AM 5.4 4.8 - 5.6 % Final    Comment:    (NOTE)         Prediabetes: 5.7 - 6.4         Diabetes: >6.4         Glycemic control for adults with diabetes: <7.0   12/04/2014 12:12 PM 5.9 (H) 4.8 - 5.6 % Final    Comment:    (NOTE)         Pre-diabetes: 5.7 - 6.4         Diabetes: >6.4         Glycemic control for adults with diabetes: <7.0  CBG: Recent Labs  Lab 05/07/21 0744 05/07/21 1050 05/07/21 1534 05/07/21 2132 05/08/21 0741  GLUCAP 121* 149* 102* 120* 78     Critical care time: 31     The patient is critically ill due to subdural hematoma, active bleeding.  Critical care was necessary to treat or prevent imminent or life-threatening deterioration.  Critical care was time spent personally by me on the following activities: development of treatment plan with patient and/or surrogate as well as nursing, discussions with consultants, evaluation of patient's response to treatment, examination of patient, obtaining history from patient or surrogate, ordering and performing treatments and interventions, ordering and review of laboratory studies, ordering and review of radiographic studies, pulse oximetry, re-evaluation of patient's condition and participation in multidisciplinary rounds.   Critical Care Time devoted to patient care services described in this note is 31 minutes. This time reflects time of care of this signee Charlott Holler . This critical care time does not reflect separately billable procedures or procedure time, teaching time or supervisory time of PA/NP/Med student/Med Resident etc but could involve care discussion time.       Charlott Holler [facility] 05/08/2021 11:01 AM  Pager: see  AMION  If no response to pager , please call critical care on call (see AMION) until 7pm After 7:00 pm call Elink

## 2021-05-08 NOTE — Progress Notes (Signed)
ANTICOAGULATION CONSULT NOTE   Pharmacy Consult for Heparin IV Indication:  Mechanical valve  Allergies  Allergen Reactions   Strawberry Extract Rash    Rash & itching.    Patient Measurements: Height: 5\' 3"  (160 cm) Weight: 61.4 kg (135 lb 5.8 oz) IBW/kg (Calculated) : 52.4 Heparin Dosing Weight: 61.4 kg  Vital Signs: Temp: 98.1 F (36.7 C) (01/28 1941) Temp Source: Oral (01/28 1941) Pulse Rate: 70 (01/28 2215)  Labs: Recent Labs    05/06/21 0504 05/07/21 0042 05/08/21 0122 05/08/21 2200  HGB 9.4* 9.3* 8.5*  --   HCT 30.0* 29.0* 26.2*  --   PLT 281 253 237  --   APTT  --  35  --   --   LABPROT 14.5 14.6 15.5*  --   INR 1.1 1.1 1.2  --   HEPARINUNFRC  --   --   --  <0.10*  CREATININE 1.46* 1.13* 0.90  --      Estimated Creatinine Clearance: 43.3 mL/min (by C-G formula based on SCr of 0.9 mg/dL).   Medical History: Past Medical History:  Diagnosis Date   Acute gastric ulcer without hemorrhage or perforation    AICD (automatic cardioverter/defibrillator) present    Aortic stenosis, mild    mean gradient 05/10/21 echo 09/09   CHF (congestive heart failure) (HCC)    --Non-ischemic CM EF 30-35% by echo 9/09 (Previous 25%)  --s/p St. Jude single chamber ICD 04/09 --Minimal  CAD by cath 2004 LAD 20% LCX 20% Ramus 30% RCA nl --echo 05/11 EF 20-25%   Diabetes mellitus    type 2   Hyperlipidemia    Hypertension    Kidney stones    MR (congenital mitral regurgitation)    Severe MR s/p St. Jude mechanical MVR by 07/11 2005    Medications:  Scheduled:   atorvastatin  40 mg Oral Daily   carvedilol  12.5 mg Oral BID WC   Chlorhexidine Gluconate Cloth  6 each Topical Daily   feeding supplement (GLUCERNA SHAKE)  237 mL Oral TID BM   hydrALAZINE  25 mg Oral TID   insulin aspart  0-15 Units Subcutaneous TID WC   isosorbide mononitrate  30 mg Oral Daily   pantoprazole  40 mg Oral Daily   sodium chloride flush  3 mL Intravenous Q12H   Infusions:   heparin 700  Units/hr (05/08/21 2235)  none  Assessment: 76 years of age female with history significant for congenital MR with St Judes mechanical valve on chronic warfarin who was admitted for AMS and weakness found to have INR >10 and bilateral SDH who received reversal of anticoagulation with Kcentra and Vitamin K IV on 1/25 and is now status post Onxy embolization of bilateral middle meningeal arteries on 1/27.   Repeat CT Head this AM is stable. Ok per Neurosurgery to resume anticoagulation with IV Heparin without boluses. Pharmacy consulted to start IV Heparin (no bolus). Hgb down to 8.5 today. Platelets stable. INR currently at 1.2. No overt signs of bleeding. Patient reported mild headache this AM.   Plan per Neurosurgery - once patient is therapeutic on IV Heparin plan to repeat CT Head and if remains stable, then resume Warfarin.  PM f/u - heparin level undetectable.  Discussed with RN, no known issues with IV infusion.  No overt bleeding or complications noted.  Goal of Therapy:  Heparin level 0.3 to 0.5 units/ml Monitor platelets by anticoagulation protocol: Yes   Plan:  Increase IV heparin to 700 units/hr.  Heparin level in 8 hours.  Daily PT/INR, heparin level, and CBC.  Monitor for any signs or symptoms of bleeding and mental status changes.  Once therapeutic on IV Heparin, follow-up repeat Head CT to start Warfarin. Will need overlap.   Reece Leader, Colon Flattery, BCCP Clinical Pharmacist  05/08/2021 10:37 PM   [facility] pharmacy phone numbers are listed on [email or web address]

## 2021-05-08 NOTE — Progress Notes (Signed)
Subjective: Patient reports that she has a mild headache. Otherwise, she stated "I'm good." No acute events overnight.   Objective: Vital signs in last 24 hours: Temp:  [97.5 F (36.4 C)-98.2 F (36.8 C)] 97.5 F (36.4 C) (01/28 0750) Pulse Rate:  [56-100] 59 (01/28 0700) Resp:  [12-24] 18 (01/28 0700) BP: (102-127)/(51-90) 127/64 (01/28 0700) SpO2:  [94 %-100 %] 98 % (01/28 0700) Arterial Line BP: (117-136)/(49-68) 130/63 (01/28 0700)  Intake/Output from previous day: 01/27 0701 - 01/28 0700 In: 43.6 [I.V.:3; IV Piggyback:40.6] Out: 610 [Urine:600; Blood:10] Intake/Output this shift: No intake/output data recorded.  Physical Exam: Patient is awake and A/O X 3. She is disoriented to time (thought month was June or July). Follows commands in BUE and BLE. She is conversant with appropriate speech and thought content. She is in NAD. VSS. MAEW. BUE and BLE 4/5. Sensation to light touch is intact. PERLA, EOMI. CNs grossly intact. Right femoral insertion site with CDI dressing, Level 0. BLE distal pulses +1.    Lab Results: Recent Labs    05/07/21 0042 05/08/21 0122  WBC 13.0* 9.5  HGB 9.3* 8.5*  HCT 29.0* 26.2*  PLT 253 237   BMET Recent Labs    05/07/21 0042 05/08/21 0122  NA 138 141  K 3.8 3.9  CL 105 107  CO2 26 26  GLUCOSE 150* 107*  BUN 30* 19  CREATININE 1.13* 0.90  CALCIUM 8.1* 8.0*    Studies/Results: IR Transcath/Emboliz  Result Date: 05/07/2021 PROCEDURE: DIAGNOSTIC CEREBRAL ANGIOGRAM ONYX EMBOLIZATION OF RIGHT MIDDLE MENINGEAL ARTERY ONYX EMBOLIZATION OF LEFT MIDDLE MENINGEAL ARTERY HISTORY: The patient is a 76 year old woman presenting to the hospital with altered mental status with a 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 Her INR upon admission was greater than 10. 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 Patient does also have multiple underlying medical comorbidities including congestive heart  failure. She was felt to be a poor surgical candidate with relatively high risk of perioperative morbidity for open surgical evacuation of her subdural hematomas. In addition, this would require significant time off her anticoagulation increasing her risk with her prosthetic valve. Middle meningeal artery embolization on both sides was therefore requested in an attempt to avoid open surgical evacuation and minimize time off anticoagulation. ACCESS: The technical aspects of the procedure as well as its potential risks and benefits were reviewed with the patient's family in detail. These risks included but were not limited to stroke, intracranial hemorrhage, bleeding, infection, allergic reaction, damage to organs or vital structures, stroke, non-diagnostic procedure, and the catastrophic outcomes of heart attack, coma, and death. With an understanding of these risks, informed consent was obtained and witnessed. The patient was placed in the supine position on the angiography table and the skin of right groin prepped in the usual sterile fashion. The procedure was performed under general anesthesia. A 5-French sheath was introduced in the right common femoral artery using Seldinger technique. MEDICATIONS: HEPARIN: 3000 Units total. CONTRAST:  34mL OMNIPAQUE IOHEXOL 300 MG/ML  SOLNcc, Omnipaque 300 FLUOROSCOPY TIME:  FLUOROSCOPY TIME: See IR records TECHNIQUE: CATHETERS AND WIRES 5-French MPD guide catheter 180 cm 0.035" glidewire Apollo microcatheter 3 cm tip Apollo microcatheter 1.5 cm tip [employer] 10 microwire LIQUID EMBOLIC USED Onyx 34 VESSELS CATHETERIZED Right internal carotid Right external carotid Right middle meningeal artery Left internal carotid Left external carotid Left middle meningeal artery Right common femoral VESSELS STUDIED Right internal carotid, head Right external carotid,  head Right middle meningeal, microcatheter run Right external carotid, post embolization Right internal carotid, post  embolization Left internal carotid, head Left external carotid, head Left middle meningeal, microcatheter run Left external carotid, post embolization Left internal carotid, post embolization Right common femoral PROCEDURAL NARRATIVE The 5-Fr MPD guide catheter was advanced over a 0.035 glidewire into the aortic arch. Initially, the right common carotid artery was catheterized. The right internal carotid artery was catheterized and cerebral angiogram taken. The right external carotid artery was also catheterized and angiogram taken. No abnormal anastomoses were noted and the decision was made to proceed with embolization of the middle meningeal artery. Under roadmap guidance, the Apollo microcatheter was advanced over the microwire into the right middle meningeal artery. The catheter was then flushed with DMSO, and under blank roadmap technique, the right middle meningeal artery was embolized with onyx 34. Once the middle meningeal artery embolization was complete and there was significant contrast reflux, the Apollo microcatheter was removed without incident. Post embolization angiogram was taken through the guide catheter in the right external carotid artery. The guide catheter was then taken into the right internal carotid artery and final angiogram was taken. The catheter was then withdrawn back down into the aortic arch and the left common carotid artery followed by the left internal carotid artery was catheterized. Angiogram was taken. The catheter was then advanced over the Glidewire into the left external carotid artery. Angiogram was taken. No abnormal anastomoses were seen and the decision was made to proceed with embolization of the left middle meningeal artery. Under roadmap guidance, the Apollo microcatheter was advanced over the microwire into the left middle meningeal artery. The catheter was then flushed with DMSO and the left middle meningeal artery was embolized with onyx 34. Once the embolization  was completed, the microcatheter was removed without incident. Final angiogram was taken through the guide catheter in the left external carotid artery. The guide catheter was then repositioned into the left internal carotid artery and final angiogram was taken. The guide catheter was then removed without incident. FINDINGS: Right internal carotid, head: Injection reveals the presence of a widely patent ICA, M1, and A1 segments and their branches. Of note, there is a patent anterior communicating artery with perfusion of bilateral ACA territories through this right-sided injection. Of note, there is mass effect upon the right cerebral convexity away from the calvarium consistent with the known subdural hematoma. Also noted is mass effect upon the medial hemispheres away from the falx again consistent with the patient's known falcine subdural hematoma. The parenchymal and venous phases are normal. The venous sinuses are widely patent. Right external carotid, head: The visualized cranial branches of the right external carotid artery are unremarkable. Of note, there is both a middle meningeal and accessory middle meningeal artery. No clear opacification of a subdural membrane is identified. Right middle meningeal artery, head: Microcatheter injection from the right middle meningeal artery does not demonstrate any anastomoses to the underlying brain parenchyma. There may be some subtle contrast blush in the orbital frontal region possibly representing perfusion to a subdural membrane. Right external carotid, head (post embolization) The right middle meningeal artery is occluded near its origin. Onyx cast is seen in the more proximal portions of the right middle meningeal artery. The accessory middle meningeal artery remains patent. The remainder of the cranial branches of the right external carotid artery are unremarkable. Again, no clear identification of a subdural membrane is seen. Right internal carotid, head (final  control): Injection reveals  the presence of a widely patent ICA that leads to a patent ACA and MCA. No thrombus is visualized. Capillary phase does not demonstrate any branch occlusions or perfusion deficits. Venous sinuses are patent. Left internal carotid, head : Injection reveals the presence of a widely patent ICA and M1 segments and their branches. The left A1 is hypoplastic. The parenchymal and venous phases are normal. The venous sinuses are widely patent. Again noted is mass effect upon the left cerebral convexity related to the patient's known subdural hematoma. Left external carotid, head: The visualized cranial branches of the left external carotid artery are unremarkable. Of note, there is a patent left middle meningeal and accessory middle meningeal arteries. There is no definitive identification of a subdural membrane. Left middle meningeal artery: Microcatheter run taken from the left middle meningeal artery does not reveal any anastomosis to the brain parenchyma. There is no clear identification of a subdural membrane. Left external carotid, head (post embolization): The left middle meningeal artery is occluded near its origin. The accessory middle meningeal artery does remain patent. On ax cast is seen in the more frontally directed branches of the left middle meningeal artery. The remainder of the cranial branches of the external carotid artery are unremarkable. Left internal carotid artery, head (final control): Injection reveals the presence of a widely patent ICA that leads to a patent MCA. No thrombus is visualized. Capillary phase does not demonstrate any branch occlusions or perfusion deficits. Venous sinuses are patent. Right femoral: Normal vessel. No significant atherosclerotic disease. Arterial sheath in adequate position. DISPOSITION: Upon completion of the study, the femoral sheath was removed and hemostasis obtained using a 5-Fr ExoSeal closure device. Good proximal and distal lower  extremity pulses were documented upon achievement of hemostasis. The procedure was well tolerated and no early complications were observed. The patient was transferred to the intensive care unit in stable hemodynamic condition. IMPRESSION: 1. Successful Onyx embolization of bilateral middle meningeal arteries for subdural hematoma The preliminary results of this procedure were shared with the patient's family. Electronically Signed   By: Lisbeth Renshaw   On: 05/07/2021 19:34   ECHOCARDIOGRAM COMPLETE  Result Date: 05/07/2021    ECHOCARDIOGRAM REPORT   Patient Name:   Deborah [name] Date of Exam: 05/07/2021 Medical Rec #:  [id number]      Height:       63.0 in Accession #:    [id number]     Weight:       135.4 lb Date of Birth:  [birth year]       BSA:          1.638 m Patient Age:    75 years       BP:           104/56 mmHg Patient Gender: F              HR:           68 bpm. Exam Location:  Inpatient Procedure: 2D Echo Indications:    CHF  History:        Patient has prior history of Echocardiogram examinations, most                 recent 07/22/2020. CHF, COPD, Mitral Valve Disease; Risk                 Factors:Hypertension.  Sonographer:    Eduard Roux Referring Phys: [phone] LINDSAY NICOLE FINCH IMPRESSIONS  1. Left ventricular ejection fraction, by estimation, is <20%. The  left ventricle has severely decreased function. The left ventricle demonstrates global hypokinesis. The left ventricular internal cavity size was severely dilated. Left ventricular diastolic function could not be evaluated.  2. Right ventricular systolic function is moderately reduced. The right ventricular size is normal. There is moderately elevated pulmonary artery systolic pressure. The estimated right ventricular systolic pressure is Q000111Q mmHg.  3. Left atrial size was severely dilated.  4. Right atrial size was moderately dilated.  5. There is a trivial pericardial effusion \ posterior to the left ventricle.  6. The mitral valve has  been repaired/replaced. Cannot assess for MR due to LA shadowing from the mechanical MVR mitral valve regurgitation. No evidence of mitral stenosis. The mean mitral valve gradient is 2.5 mmHg.  7. Tricuspid valve regurgitation is moderate.  8. The aortic valve is tricuspid. Aortic valve regurgitation is not visualized. Aortic valve sclerosis/calcification is present, without any evidence of aortic stenosis. Aortic valve Vmax measures 1.37 m/s.  9. The inferior vena cava is dilated in size with >50% respiratory variability, suggesting right atrial pressure of 8 mmHg. FINDINGS  Left Ventricle: Left ventricular ejection fraction, by estimation, is <20%. The left ventricle has severely decreased function. The left ventricle demonstrates global hypokinesis. The left ventricular internal cavity size was severely dilated. There is no left ventricular hypertrophy. Left ventricular diastolic function could not be evaluated due to mitral valve replacement. Left ventricular diastolic function could not be evaluated. Right Ventricle: The right ventricular size is normal. No increase in right ventricular wall thickness. Right ventricular systolic function is moderately reduced. There is moderately elevated pulmonary artery systolic pressure. The tricuspid regurgitant velocity is 3.54 m/s, and with an assumed right atrial pressure of 8 mmHg, the estimated right ventricular systolic pressure is Q000111Q mmHg. Left Atrium: Left atrial size was severely dilated. Right Atrium: Right atrial size was moderately dilated. Pericardium: Trivial pericardial effusion is present. The pericardial effusion is posterior to the left ventricle. Mitral Valve: The mitral valve has been repaired/replaced. Cannot assess for MR due to LA shadowing from the mechanical MVR mitral valve regurgitation. There is a mechanical valve present in the mitral position. No evidence of mitral valve stenosis. MV peak gradient, 7.5 mmHg. The mean mitral valve gradient is  2.5 mmHg. Tricuspid Valve: The tricuspid valve is normal in structure. Tricuspid valve regurgitation is moderate . No evidence of tricuspid stenosis. Aortic Valve: The aortic valve is tricuspid. Aortic valve regurgitation is not visualized. Aortic valve sclerosis/calcification is present, without any evidence of aortic stenosis. Aortic valve peak gradient measures 7.5 mmHg. Pulmonic Valve: The pulmonic valve was normal in structure. Pulmonic valve regurgitation is not visualized. No evidence of pulmonic stenosis. Aorta: The aortic root is normal in size and structure. Venous: The inferior vena cava is dilated in size with greater than 50% respiratory variability, suggesting right atrial pressure of 8 mmHg. IAS/Shunts: There is right bowing of the interatrial septum, suggestive of elevated left atrial pressure. No atrial level shunt detected by color flow Doppler. Additional Comments: A device lead is visualized.  LEFT VENTRICLE PLAX 2D LVIDd:         6.00 cm LVIDs:         5.30 cm LV PW:         1.00 cm LV IVS:        0.80 cm  RIGHT VENTRICLE            IVC RV Basal diam:  2.60 cm    IVC diam: 2.70 cm RV  S prime:     6.43 cm/s TAPSE (M-mode): 1.3 cm LEFT ATRIUM              Index         RIGHT ATRIUM           Index LA diam:        3.90 cm  2.38 cm/m    RA Area:     19.60 cm LA Vol (A2C):   171.0 ml 104.39 ml/m  RA Volume:   54.30 ml  33.15 ml/m LA Vol (A4C):   101.0 ml 61.66 ml/m LA Biplane Vol: 131.0 ml 79.97 ml/m  AORTIC VALVE              PULMONIC VALVE AV Vmax:      137.00 cm/s PV Vmax:       0.59 m/s AV Peak Grad: 7.5 mmHg    PV Peak grad:  1.4 mmHg LVOT Vmax:    86.20 cm/s LVOT Vmean:   55.100 cm/s LVOT VTI:     0.166 m  AORTA Ao Asc diam: 3.00 cm MITRAL VALVE                TRICUSPID VALVE MV Area (PHT): 4.63 cm     TR Peak grad:   50.1 mmHg MV Peak grad:  7.5 mmHg     TR Vmax:        354.00 cm/s MV Mean grad:  2.5 mmHg MV Vmax:       1.37 m/s     SHUNTS MV Vmean:      67.0 cm/s    Systemic VTI: 0.17  m MV Decel Time: 164 msec MR Peak grad: 98.4 mmHg MR Vmax:      496.00 cm/s MV E velocity: 100.50 cm/s MV A velocity: 81.00 cm/s MV E/A ratio:  1.24 Fransico Him MD Electronically signed by Fransico Him MD Signature Date/Time: 05/07/2021/3:23:13 PM    Final    IR ANGIO EXTERNAL CAROTID SEL EXT CAROTID BILAT MOD SED  Result Date: 05/07/2021 PROCEDURE: DIAGNOSTIC CEREBRAL ANGIOGRAM ONYX EMBOLIZATION OF RIGHT MIDDLE MENINGEAL ARTERY ONYX EMBOLIZATION OF LEFT MIDDLE MENINGEAL ARTERY HISTORY: The patient is a 76 year old woman presenting to the hospital with altered mental status with a 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 Her INR upon admission was greater than 10. 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 Patient does also have multiple underlying medical comorbidities including congestive heart failure. She was felt to be a poor surgical candidate with relatively high risk of perioperative morbidity for open surgical evacuation of her subdural hematomas. In addition, this would require significant time off her anticoagulation increasing her risk with her prosthetic valve. Middle meningeal artery embolization on both sides was therefore requested in an attempt to avoid open surgical evacuation and minimize time off anticoagulation. ACCESS: The technical aspects of the procedure as well as its potential risks and benefits were reviewed with the patient's family in detail. These risks included but were not limited to stroke, intracranial hemorrhage, bleeding, infection, allergic reaction, damage to organs or vital structures, stroke, non-diagnostic procedure, and the catastrophic outcomes of heart attack, coma, and death. With an understanding of these risks, informed consent was obtained and witnessed. The patient was placed in the supine position on the angiography table and the skin of right groin prepped in the usual sterile fashion. The procedure was  performed under general anesthesia. A 5-French sheath was introduced in the right common  femoral artery using Seldinger technique. MEDICATIONS: HEPARIN: 3000 Units total. CONTRAST:  54mL OMNIPAQUE IOHEXOL 300 MG/ML  SOLNcc, Omnipaque 300 FLUOROSCOPY TIME:  FLUOROSCOPY TIME: See IR records TECHNIQUE: CATHETERS AND WIRES 5-French MPD guide catheter 180 cm 0.035" glidewire Apollo microcatheter 3 cm tip Apollo microcatheter 1.5 cm tip [employer] 10 microwire LIQUID EMBOLIC USED Onyx 34 VESSELS CATHETERIZED Right internal carotid Right external carotid Right middle meningeal artery Left internal carotid Left external carotid Left middle meningeal artery Right common femoral VESSELS STUDIED Right internal carotid, head Right external carotid, head Right middle meningeal, microcatheter run Right external carotid, post embolization Right internal carotid, post embolization Left internal carotid, head Left external carotid, head Left middle meningeal, microcatheter run Left external carotid, post embolization Left internal carotid, post embolization Right common femoral PROCEDURAL NARRATIVE The 5-Fr MPD guide catheter was advanced over a 0.035 glidewire into the aortic arch. Initially, the right common carotid artery was catheterized. The right internal carotid artery was catheterized and cerebral angiogram taken. The right external carotid artery was also catheterized and angiogram taken. No abnormal anastomoses were noted and the decision was made to proceed with embolization of the middle meningeal artery. Under roadmap guidance, the Apollo microcatheter was advanced over the microwire into the right middle meningeal artery. The catheter was then flushed with DMSO, and under blank roadmap technique, the right middle meningeal artery was embolized with onyx 34. Once the middle meningeal artery embolization was complete and there was significant contrast reflux, the Apollo microcatheter was removed without incident. Post  embolization angiogram was taken through the guide catheter in the right external carotid artery. The guide catheter was then taken into the right internal carotid artery and final angiogram was taken. The catheter was then withdrawn back down into the aortic arch and the left common carotid artery followed by the left internal carotid artery was catheterized. Angiogram was taken. The catheter was then advanced over the Glidewire into the left external carotid artery. Angiogram was taken. No abnormal anastomoses were seen and the decision was made to proceed with embolization of the left middle meningeal artery. Under roadmap guidance, the Apollo microcatheter was advanced over the microwire into the left middle meningeal artery. The catheter was then flushed with DMSO and the left middle meningeal artery was embolized with onyx 34. Once the embolization was completed, the microcatheter was removed without incident. Final angiogram was taken through the guide catheter in the left external carotid artery. The guide catheter was then repositioned into the left internal carotid artery and final angiogram was taken. The guide catheter was then removed without incident. FINDINGS: Right internal carotid, head: Injection reveals the presence of a widely patent ICA, M1, and A1 segments and their branches. Of note, there is a patent anterior communicating artery with perfusion of bilateral ACA territories through this right-sided injection. Of note, there is mass effect upon the right cerebral convexity away from the calvarium consistent with the known subdural hematoma. Also noted is mass effect upon the medial hemispheres away from the falx again consistent with the patient's known falcine subdural hematoma. The parenchymal and venous phases are normal. The venous sinuses are widely patent. Right external carotid, head: The visualized cranial branches of the right external carotid artery are unremarkable. Of note, there is  both a middle meningeal and accessory middle meningeal artery. No clear opacification of a subdural membrane is identified. Right middle meningeal artery, head: Microcatheter injection from the right middle meningeal artery does not demonstrate any anastomoses to  the underlying brain parenchyma. There may be some subtle contrast blush in the orbital frontal region possibly representing perfusion to a subdural membrane. Right external carotid, head (post embolization) The right middle meningeal artery is occluded near its origin. Onyx cast is seen in the more proximal portions of the right middle meningeal artery. The accessory middle meningeal artery remains patent. The remainder of the cranial branches of the right external carotid artery are unremarkable. Again, no clear identification of a subdural membrane is seen. Right internal carotid, head (final control): Injection reveals the presence of a widely patent ICA that leads to a patent ACA and MCA. No thrombus is visualized. Capillary phase does not demonstrate any branch occlusions or perfusion deficits. Venous sinuses are patent. Left internal carotid, head : Injection reveals the presence of a widely patent ICA and M1 segments and their branches. The left A1 is hypoplastic. The parenchymal and venous phases are normal. The venous sinuses are widely patent. Again noted is mass effect upon the left cerebral convexity related to the patient's known subdural hematoma. Left external carotid, head: The visualized cranial branches of the left external carotid artery are unremarkable. Of note, there is a patent left middle meningeal and accessory middle meningeal arteries. There is no definitive identification of a subdural membrane. Left middle meningeal artery: Microcatheter run taken from the left middle meningeal artery does not reveal any anastomosis to the brain parenchyma. There is no clear identification of a subdural membrane. Left external carotid, head  (post embolization): The left middle meningeal artery is occluded near its origin. The accessory middle meningeal artery does remain patent. On ax cast is seen in the more frontally directed branches of the left middle meningeal artery. The remainder of the cranial branches of the external carotid artery are unremarkable. Left internal carotid artery, head (final control): Injection reveals the presence of a widely patent ICA that leads to a patent MCA. No thrombus is visualized. Capillary phase does not demonstrate any branch occlusions or perfusion deficits. Venous sinuses are patent. Right femoral: Normal vessel. No significant atherosclerotic disease. Arterial sheath in adequate position. DISPOSITION: Upon completion of the study, the femoral sheath was removed and hemostasis obtained using a 5-Fr ExoSeal closure device. Good proximal and distal lower extremity pulses were documented upon achievement of hemostasis. The procedure was well tolerated and no early complications were observed. The patient was transferred to the intensive care unit in stable hemodynamic condition. IMPRESSION: 1. Successful Onyx embolization of bilateral middle meningeal arteries for subdural hematoma The preliminary results of this procedure were shared with the patient's family. 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 Her INR upon admission was greater than 10. 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 Patient does also have multiple underlying medical comorbidities including congestive  heart failure. She was felt to be a poor surgical candidate with relatively high risk of perioperative morbidity for open surgical evacuation of her subdural hematomas. In addition, this would require significant time off her anticoagulation increasing her risk with her prosthetic valve. Middle meningeal artery embolization on both sides was therefore requested in  an attempt to avoid open surgical evacuation and minimize time off anticoagulation. ACCESS: The technical aspects of the procedure as well as its potential risks and benefits were reviewed with the patient's family in detail. These risks included but were not limited to stroke, intracranial hemorrhage, bleeding, infection, allergic reaction, damage to organs or vital structures, stroke, non-diagnostic procedure, and the catastrophic outcomes of heart attack, coma, and death. With an understanding of these risks, informed consent was obtained and witnessed. The patient was placed in the supine position on the angiography table and the skin of right groin prepped in the usual sterile fashion. The procedure was performed under general anesthesia. A 5-French sheath was introduced in the right common femoral artery using Seldinger technique. MEDICATIONS: HEPARIN: 3000 Units total. CONTRAST:  32mL OMNIPAQUE IOHEXOL 300 MG/ML  SOLNcc, Omnipaque 300 FLUOROSCOPY TIME:  FLUOROSCOPY TIME: See IR records TECHNIQUE: CATHETERS AND WIRES 5-French MPD guide catheter 180 cm 0.035" glidewire Apollo microcatheter 3 cm tip Apollo microcatheter 1.5 cm tip [employer] 10 microwire LIQUID EMBOLIC USED Onyx 34 VESSELS CATHETERIZED Right internal carotid Right external carotid Right middle meningeal artery Left internal carotid Left external carotid Left middle meningeal artery Right common femoral VESSELS STUDIED Right internal carotid, head Right external carotid, head Right middle meningeal, microcatheter run Right external carotid, post embolization Right internal carotid,  post embolization Left internal carotid, head Left external carotid, head Left middle meningeal, microcatheter run Left external carotid, post embolization Left internal carotid, post embolization Right common femoral PROCEDURAL NARRATIVE The 5-Fr MPD guide catheter was advanced over a 0.035 glidewire into the aortic arch. Initially, the right common carotid artery was catheterized. The right internal carotid artery was catheterized and cerebral angiogram taken. The right external carotid artery was also catheterized and angiogram taken. No abnormal anastomoses were noted and the decision was made to proceed with embolization of the middle meningeal artery. Under roadmap guidance, the Apollo microcatheter was advanced over the microwire into the right middle meningeal artery. The catheter was then flushed with DMSO, and under blank roadmap technique, the right middle meningeal artery was embolized with onyx 34. Once the middle meningeal artery embolization was complete and there was significant contrast reflux, the Apollo microcatheter was removed without incident. Post embolization angiogram was taken through the guide catheter in the right external carotid artery. The guide catheter was then taken into the right internal carotid artery and final angiogram was taken. The catheter was then withdrawn back down into the aortic arch and the left common carotid artery followed by the left internal carotid artery was catheterized. Angiogram was taken. The catheter was then advanced over the Glidewire into the left external carotid artery. Angiogram was taken. No abnormal anastomoses were seen and the decision was made to proceed with embolization of the left middle meningeal artery. Under roadmap guidance, the Apollo microcatheter was advanced over the microwire into the left middle meningeal artery. The catheter was then flushed with DMSO and the left middle meningeal artery was embolized with onyx 34. Once the  embolization was completed, the microcatheter was removed without incident. Final angiogram was taken through the guide catheter in the left external carotid artery. The guide catheter was then repositioned into the left internal carotid artery and final angiogram was taken. The guide catheter was then removed without incident. FINDINGS: Right internal carotid, head: Injection reveals the presence of a widely patent ICA, M1, and A1 segments and their branches. Of note, there is a patent anterior communicating artery with perfusion  of bilateral ACA territories through this right-sided injection. Of note, there is mass effect upon the right cerebral convexity away from the calvarium consistent with the known subdural hematoma. Also noted is mass effect upon the medial hemispheres away from the falx again consistent with the patient's known falcine subdural hematoma. The parenchymal and venous phases are normal. The venous sinuses are widely patent. Right external carotid, head: The visualized cranial branches of the right external carotid artery are unremarkable. Of note, there is both a middle meningeal and accessory middle meningeal artery. No clear opacification of a subdural membrane is identified. Right middle meningeal artery, head: Microcatheter injection from the right middle meningeal artery does not demonstrate any anastomoses to the underlying brain parenchyma. There may be some subtle contrast blush in the orbital frontal region possibly representing perfusion to a subdural membrane. Right external carotid, head (post embolization) The right middle meningeal artery is occluded near its origin. Onyx cast is seen in the more proximal portions of the right middle meningeal artery. The accessory middle meningeal artery remains patent. The remainder of the cranial branches of the right external carotid artery are unremarkable. Again, no clear identification of a subdural membrane is seen. Right internal  carotid, head (final control): Injection reveals the presence of a widely patent ICA that leads to a patent ACA and MCA. No thrombus is visualized. Capillary phase does not demonstrate any branch occlusions or perfusion deficits. Venous sinuses are patent. Left internal carotid, head : Injection reveals the presence of a widely patent ICA and M1 segments and their branches. The left A1 is hypoplastic. The parenchymal and venous phases are normal. The venous sinuses are widely patent. Again noted is mass effect upon the left cerebral convexity related to the patient's known subdural hematoma. Left external carotid, head: The visualized cranial branches of the left external carotid artery are unremarkable. Of note, there is a patent left middle meningeal and accessory middle meningeal arteries. There is no definitive identification of a subdural membrane. Left middle meningeal artery: Microcatheter run taken from the left middle meningeal artery does not reveal any anastomosis to the brain parenchyma. There is no clear identification of a subdural membrane. Left external carotid, head (post embolization): The left middle meningeal artery is occluded near its origin. The accessory middle meningeal artery does remain patent. On ax cast is seen in the more frontally directed branches of the left middle meningeal artery. The remainder of the cranial branches of the external carotid artery are unremarkable. Left internal carotid artery, head (final control): Injection reveals the presence of a widely patent ICA that leads to a patent MCA. No thrombus is visualized. Capillary phase does not demonstrate any branch occlusions or perfusion deficits. Venous sinuses are patent. Right femoral: Normal vessel. No significant atherosclerotic disease. Arterial sheath in adequate position. DISPOSITION: Upon completion of the study, the femoral sheath was removed and hemostasis obtained using a 5-Fr ExoSeal closure device. Good  proximal and distal lower extremity pulses were documented upon achievement of hemostasis. The procedure was well tolerated and no early complications were observed. The patient was transferred to the intensive care unit in stable hemodynamic condition. IMPRESSION: 1. Successful Onyx embolization of bilateral middle meningeal arteries for subdural hematoma The preliminary results of this procedure were shared with the patient's family. 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 Her INR upon admission was greater than 10. 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 Patient does also have multiple underlying medical comorbidities including congestive heart failure. She was felt to be a poor surgical candidate with relatively high risk of perioperative morbidity for open surgical evacuation of her subdural hematomas. In addition, this would require significant time off her anticoagulation increasing her risk with her prosthetic valve. Middle meningeal artery embolization on both sides was therefore requested in an attempt to avoid open surgical evacuation and minimize time off anticoagulation. ACCESS: The technical aspects of the procedure as well as its potential risks and benefits were reviewed with the patient's family in detail. These risks included but were not limited to stroke, intracranial hemorrhage, bleeding, infection, allergic reaction, damage to organs or vital structures, stroke, non-diagnostic procedure, and the catastrophic outcomes of heart attack, coma, and  death. With an understanding of these risks, informed consent was obtained and witnessed. The patient was placed in the supine position on the angiography table and the skin of right groin prepped in the usual sterile fashion. The procedure was performed under general anesthesia. A 5-French sheath was introduced in the right common femoral artery using Seldinger technique. MEDICATIONS: HEPARIN: 3000 Units total. CONTRAST:  64mL OMNIPAQUE IOHEXOL 300 MG/ML  SOLNcc, Omnipaque 300 FLUOROSCOPY TIME:  FLUOROSCOPY TIME: See IR records TECHNIQUE: CATHETERS AND WIRES 5-French MPD guide catheter 180 cm 0.035" glidewire Apollo microcatheter 3 cm tip Apollo microcatheter 1.5 cm tip [employer] 10 microwire LIQUID EMBOLIC USED Onyx 34 VESSELS CATHETERIZED Right internal carotid Right external carotid Right middle meningeal artery Left internal carotid Left external carotid Left middle meningeal artery Right common femoral VESSELS STUDIED Right internal carotid, head Right external carotid, head Right middle meningeal, microcatheter run Right external carotid, post embolization Right internal carotid, post embolization Left internal carotid, head Left external carotid, head Left middle meningeal, microcatheter run Left external carotid, post embolization Left internal carotid, post embolization Right common femoral PROCEDURAL NARRATIVE The 5-Fr MPD guide catheter was advanced over a 0.035 glidewire into the aortic arch. Initially, the right common carotid artery was catheterized. The right internal carotid artery was catheterized and cerebral angiogram taken. The right external carotid artery was also catheterized and angiogram taken. No abnormal anastomoses were noted and the decision was made to proceed with embolization of the middle meningeal artery. Under roadmap guidance, the Apollo microcatheter was advanced over the microwire into the right middle meningeal artery. The catheter was then flushed with DMSO, and under  blank roadmap technique, the right middle meningeal artery was embolized with onyx 34. Once the middle meningeal artery embolization was complete and there was significant contrast reflux, the Apollo microcatheter was removed without incident. Post embolization angiogram was taken through the guide catheter in the right external carotid artery. The guide catheter was then taken into the right internal carotid artery and final angiogram was taken. The catheter was then withdrawn back down into the aortic arch and the left common carotid artery followed by the left internal carotid artery was catheterized. Angiogram was taken. The catheter was then advanced over the Glidewire into the left external carotid artery. Angiogram was taken. No abnormal anastomoses were seen and the decision was made to proceed with embolization of the  left middle meningeal artery. Under roadmap guidance, the Apollo microcatheter was advanced over the microwire into the left middle meningeal artery. The catheter was then flushed with DMSO and the left middle meningeal artery was embolized with onyx 34. Once the embolization was completed, the microcatheter was removed without incident. Final angiogram was taken through the guide catheter in the left external carotid artery. The guide catheter was then repositioned into the left internal carotid artery and final angiogram was taken. The guide catheter was then removed without incident. FINDINGS: Right internal carotid, head: Injection reveals the presence of a widely patent ICA, M1, and A1 segments and their branches. Of note, there is a patent anterior communicating artery with perfusion of bilateral ACA territories through this right-sided injection. Of note, there is mass effect upon the right cerebral convexity away from the calvarium consistent with the known subdural hematoma. Also noted is mass effect upon the medial hemispheres away from the falx again consistent with the patient's  known falcine subdural hematoma. The parenchymal and venous phases are normal. The venous sinuses are widely patent. Right external carotid, head: The visualized cranial branches of the right external carotid artery are unremarkable. Of note, there is both a middle meningeal and accessory middle meningeal artery. No clear opacification of a subdural membrane is identified. Right middle meningeal artery, head: Microcatheter injection from the right middle meningeal artery does not demonstrate any anastomoses to the underlying brain parenchyma. There may be some subtle contrast blush in the orbital frontal region possibly representing perfusion to a subdural membrane. Right external carotid, head (post embolization) The right middle meningeal artery is occluded near its origin. Onyx cast is seen in the more proximal portions of the right middle meningeal artery. The accessory middle meningeal artery remains patent. The remainder of the cranial branches of the right external carotid artery are unremarkable. Again, no clear identification of a subdural membrane is seen. Right internal carotid, head (final control): Injection reveals the presence of a widely patent ICA that leads to a patent ACA and MCA. No thrombus is visualized. Capillary phase does not demonstrate any branch occlusions or perfusion deficits. Venous sinuses are patent. Left internal carotid, head : Injection reveals the presence of a widely patent ICA and M1 segments and their branches. The left A1 is hypoplastic. The parenchymal and venous phases are normal. The venous sinuses are widely patent. Again noted is mass effect upon the left cerebral convexity related to the patient's known subdural hematoma. Left external carotid, head: The visualized cranial branches of the left external carotid artery are unremarkable. Of note, there is a patent left middle meningeal and accessory middle meningeal arteries. There is no definitive identification of a  subdural membrane. Left middle meningeal artery: Microcatheter run taken from the left middle meningeal artery does not reveal any anastomosis to the brain parenchyma. There is no clear identification of a subdural membrane. Left external carotid, head (post embolization): The left middle meningeal artery is occluded near its origin. The accessory middle meningeal artery does remain patent. On ax cast is seen in the more frontally directed branches of the left middle meningeal artery. The remainder of the cranial branches of the external carotid artery are unremarkable. Left internal carotid artery, head (final control): Injection reveals the presence of a widely patent ICA that leads to a patent MCA. No thrombus is visualized. Capillary phase does not demonstrate any branch occlusions or perfusion deficits. Venous sinuses are patent. Right femoral: Normal vessel. No significant atherosclerotic disease. Arterial  sheath in adequate position. DISPOSITION: Upon completion of the study, the femoral sheath was removed and hemostasis obtained using a 5-Fr ExoSeal closure device. Good proximal and distal lower extremity pulses were documented upon achievement of hemostasis. The procedure was well tolerated and no early complications were observed. The patient was transferred to the intensive care unit in stable hemodynamic condition. IMPRESSION: 1. Successful Onyx embolization of bilateral middle meningeal arteries for subdural hematoma The preliminary results of this procedure were shared with the patient's family. Electronically Signed   By: Consuella Lose   On: 05/07/2021 19:34    Assessment/Plan: 76 y.o. female with bilateral SDH who is POD #1 S/P Diagnostic cerebral angiogram, Onyx embolization of bilateral middle meningeal arteries. The patient is recovering well. Her neurological exam is stable. Follow up CT head is stable. It is ok to begin heparin gtt at a lower rate without bolus. Once patient is  therapeutic on the heparin, we will repeat a CT head. If repeat CT head is stable, may resume PO anticoagulation. Continue ICU level of care.     LOS: 3 days     Marvis Moeller, DNP, NP-C 05/08/2021, 10:17 AM

## 2021-05-09 ENCOUNTER — Inpatient Hospital Stay (HOSPITAL_COMMUNITY): Payer: Medicare Other

## 2021-05-09 DIAGNOSIS — N179 Acute kidney failure, unspecified: Secondary | ICD-10-CM | POA: Diagnosis not present

## 2021-05-09 DIAGNOSIS — E872 Acidosis, unspecified: Secondary | ICD-10-CM | POA: Diagnosis not present

## 2021-05-09 DIAGNOSIS — Z7189 Other specified counseling: Secondary | ICD-10-CM

## 2021-05-09 DIAGNOSIS — I5022 Chronic systolic (congestive) heart failure: Secondary | ICD-10-CM | POA: Diagnosis not present

## 2021-05-09 DIAGNOSIS — S065X0A Traumatic subdural hemorrhage without loss of consciousness, initial encounter: Secondary | ICD-10-CM | POA: Diagnosis not present

## 2021-05-09 LAB — BASIC METABOLIC PANEL
Anion gap: 7 (ref 5–15)
BUN: 18 mg/dL (ref 8–23)
CO2: 26 mmol/L (ref 22–32)
Calcium: 7.8 mg/dL — ABNORMAL LOW (ref 8.9–10.3)
Chloride: 105 mmol/L (ref 98–111)
Creatinine, Ser: 1.04 mg/dL — ABNORMAL HIGH (ref 0.44–1.00)
GFR, Estimated: 55 mL/min — ABNORMAL LOW (ref >60)
Glucose, Bld: 119 mg/dL — ABNORMAL HIGH (ref 70–99)
Potassium: 4 mmol/L (ref 3.5–5.1)
Sodium: 138 mmol/L (ref 135–145)

## 2021-05-09 LAB — CBC
HCT: 24.8 % — ABNORMAL LOW (ref 36.0–46.0)
Hemoglobin: 8 g/dL — ABNORMAL LOW (ref 12.0–15.0)
MCH: 32.4 pg (ref 26.0–34.0)
MCHC: 32.3 g/dL (ref 30.0–36.0)
MCV: 100.4 fL — ABNORMAL HIGH (ref 80.0–100.0)
Platelets: 222 10*3/uL (ref 150–400)
RBC: 2.47 MIL/uL — ABNORMAL LOW (ref 3.87–5.11)
RDW: 13.7 % (ref 11.5–15.5)
WBC: 9.2 10*3/uL (ref 4.0–10.5)
nRBC: 0 % (ref 0.0–0.2)

## 2021-05-09 LAB — HEPARIN LEVEL (UNFRACTIONATED)
Heparin Unfractionated: <0.1 IU/mL — ABNORMAL LOW (ref 0.30–0.70)
Heparin Unfractionated: <0.1 IU/mL — ABNORMAL LOW (ref 0.30–0.70)

## 2021-05-09 LAB — PROTIME-INR
INR: 1.4 — ABNORMAL HIGH (ref 0.8–1.2)
Prothrombin Time: 17 seconds — ABNORMAL HIGH (ref 11.4–15.2)

## 2021-05-09 LAB — GLUCOSE, CAPILLARY
Glucose-Capillary: 124 mg/dL — ABNORMAL HIGH (ref 70–99)
Glucose-Capillary: 136 mg/dL — ABNORMAL HIGH (ref 70–99)
Glucose-Capillary: 177 mg/dL — ABNORMAL HIGH (ref 70–99)
Glucose-Capillary: 156 mg/dL — ABNORMAL HIGH (ref 70–99)

## 2021-05-09 NOTE — Progress Notes (Signed)
ANTICOAGULATION CONSULT NOTE   Pharmacy Consult for Heparin IV Indication:  Mechanical valve  Allergies  Allergen Reactions   Strawberry Extract Rash    Rash & itching.    Patient Measurements: Height: 5\' 3"  (160 cm) Weight: 61.4 kg (135 lb 5.8 oz) IBW/kg (Calculated) : 52.4 Heparin Dosing Weight: 61.4 kg  Vital Signs: Temp: 98.1 F (36.7 C) (01/29 1554) Temp Source: Oral (01/29 1554) BP: 144/77 (01/29 1500) Pulse Rate: 69 (01/29 1200)  Labs: Recent Labs    05/07/21 0042 05/08/21 0122 05/08/21 2200 05/09/21 0606 05/09/21 0607 05/09/21 1502  HGB 9.3* 8.5*  --  8.0*  --   --   HCT 29.0* 26.2*  --  24.8*  --   --   PLT 253 237  --  222  --   --   APTT 35  --   --   --   --   --   LABPROT 14.6 15.5*  --  17.0*  --   --   INR 1.1 1.2  --  1.4*  --   --   HEPARINUNFRC  --   --  <0.10*  --  <0.10* <0.10*  CREATININE 1.13* 0.90  --  1.04*  --   --      Estimated Creatinine Clearance: 37.5 mL/min (A) (by C-G formula based on SCr of 1.04 mg/dL (H)).   Medical History: Past Medical History:  Diagnosis Date   Acute gastric ulcer without hemorrhage or perforation    AICD (automatic cardioverter/defibrillator) present    Aortic stenosis, mild    mean gradient 05/11/21 echo 09/09   CHF (congestive heart failure) (HCC)    --Non-ischemic CM EF 30-35% by echo 9/09 (Previous 25%)  --s/p [employer] single chamber ICD 04/09 --Minimal  CAD by cath 2004 LAD 20% LCX 20% Ramus 30% RCA nl --echo 05/11 EF 20-25%   Diabetes mellitus    type 2   Hyperlipidemia    Hypertension    Kidney stones    MR (congenital mitral regurgitation)    Severe MR s/p St. Jude mechanical MVR by 07/11 2005    Medications:  Scheduled:   atorvastatin  40 mg Oral Daily   carvedilol  12.5 mg Oral BID WC   Chlorhexidine Gluconate Cloth  6 each Topical Daily   feeding supplement (GLUCERNA SHAKE)  237 mL Oral TID BM   hydrALAZINE  25 mg Oral TID   insulin aspart  0-15 Units Subcutaneous TID WC    isosorbide mononitrate  30 mg Oral Daily   pantoprazole  40 mg Oral Daily   sodium chloride flush  3 mL Intravenous Q12H   Infusions:   heparin 850 Units/hr (05/09/21 1200)  none  Assessment: 76 years of age female with history significant for congenital MR with [employer] Judes mechanical valve on chronic warfarin who was admitted for AMS and weakness found to have INR >10 and bilateral SDH who received reversal of anticoagulation with Kcentra and Vitamin K IV on 1/25 and is now status post Onxy embolization of bilateral middle meningeal arteries on 1/27.   Repeat CT Head this AM is stable. Ok per Neurosurgery to resume anticoagulation with IV Heparin without boluses. Pharmacy consulted to start IV Heparin (no bolus). Hgb down to 8.5 today. Platelets stable. INR currently at 1.2. No overt signs of bleeding. Patient reported mild headache this AM.   Plan per Neurosurgery - once patient is therapeutic on IV Heparin plan to repeat CT Head and if remains stable,  then resume Warfarin.  PM f/u > heparin level still undetectable on 850 units/hr.  No overt bleeding or complications noted.  Goal of Therapy:  Heparin level 0.3 to 0.5 units/ml Monitor platelets by anticoagulation protocol: Yes   Plan:  Increase IV heparin to 1000 units/hr. Heparin level in 8 hours.  Daily PT/INR, heparin level, and CBC.  Monitor for any signs or symptoms of bleeding and mental status changes.  Once therapeutic on IV Heparin, follow-up repeat Head CT to start Warfarin. Will need overlap.   Reece Leader, Colon Flattery, BCCP Clinical Pharmacist  05/09/2021 3:59 PM   [facility] pharmacy phone numbers are listed on [email or web address]

## 2021-05-09 NOTE — Progress Notes (Addendum)
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 Judes on coumadin, HLD, DM, and anemia who presented to [facility] ER on 1/25 by EMS with altered mental status and weakness.   Palliative care has been asked to get involved in the setting of declined functional state, medication non-compliance, and chronic disease burden to further discuss goals of care.  Today's Discussion (05/09/2021):  *Please note that this is a verbal dictation therefore any spelling or grammatical errors are due to the "[employer] One" system interpretation.  Chart reviewed inclusive of vital signs, progress notes, laboratory results, and diagnostic images.   I met with Deborah [name] at bedside. She is pleasant this afternoon and denies any pain, nausea, or shortness of breath. We reviewed the plan for her to go to a rehabilitation if PT/OT evaluates her again and deems this to be needed.   Patients son present. He shares that he and his wife plan to review MOST and Advance directives. He otherwise has no questions or Palliative concerns at this time. I shared that we will be peripherally involved to support Deborah [name] however we can and provided my direct phone number should additional questions arise.   Questions and concerns addressed   Palliative Support Provided  Objective Assessment: Vital Signs Vitals:   05/09/21 1100 05/09/21 1200  BP: 110/65 (!) 95/53  Pulse: 71 69  Resp: 19 20  Temp:    SpO2:  100%    Intake/Output Summary (Last 24 hours) at 05/09/2021 1446 Last data filed at 05/09/2021 1601 Gross per 24 hour  Intake 82.42 ml  Output 500 ml  Net -417.58 ml   Last Weight  Most recent update: 05/07/2021  6:48 AM    Weight  61.4 kg (135 lb 5.8 oz)            Gen:  Elderly frail AA F in NAD HEENT: moist mucous membranes CV: Regular rate and rhythm  PULM:  On RA  breathing is even and nonlabored ABD: soft/nontender  EXT: No edema, (+) LUB bruise Neuro: Alert and oriented x2  SUMMARY OF RECOMMENDATIONS   FULL CODE presently family continues to discuss this    Appreciate Chaplain to help with advance directives  Goal: For patient to rehabilitate and move to [place]  Will need a new PT/OT eval as presently it appears SNF was not recommended   Ongoing incremental Palliative support  MDM - Moderate   ______________________________________________________________________________________ [place] Team Team Cell Phone: [phone] Please utilize secure chat with additional questions, if there is no response within 30 minutes please call the above phone number  Palliative Medicine Team providers are available by phone from 7am to 7pm daily and can be reached through the team cell phone.  Should this patient require assistance outside of these hours, please call the patient's attending physician.

## 2021-05-09 NOTE — Progress Notes (Signed)
Neurosurgery  S: NAEs o/n  Appears deconditioned Eyes open spont, PERRL Hard of hearing, Ox2, does require several prompts but FCx4.  Minimal left sided drift  Bilateral SDH - cont hep gtt - CT head when therapeutic on heparin; if stable, can resume oral anticoagultion

## 2021-05-09 NOTE — Progress Notes (Signed)
NAME:  TANESHA VULLO, MRN:  [id number], DOB:  06/23/1945, LOS: 4 ADMISSION DATE:  05/05/2021, CONSULTATION DATE: 05/05/21 REFERRING MD: Dr. 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 Judes on coumadin, HLD, DM, and anemia who presented to [facility] ER by EMS with altered mental status and weakness.   Patient lives alone.  Her sister was unable to reach her yesterday or today.  They went to check on her and she was found her on the bathroom floor.  EMS report notes that she stated she had lost control of her bladder and sat on the floor to clean her self but was too weak to get up.  Denied falling, dizziness, syncope, etc.  Also reports not eating/ drinking for 16 hours.  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 Judes on coumadin, HLD, DM, anemia, prior GI bleed   Significant Hospital Events: Including procedures, antibiotic start and stop dates in addition to other pertinent events   1/25 Admitted, NSGY consulted, reversed with Theodoro Parma 1/25 CT head; sizable acute hemispheric subdural hematoma with mass-effect on right frontal lobe 1/26 CT head; progressive bilateral subdural hematoma with increased mass-effect on frontal lobe. 1/27 MMA embolization with nsgy 1/28 resumed heparin no bolus Interim History / Subjective:  Weak but alert. No substantial change in condition or neurologic exam since starting heparin  Objective   Blood pressure (!) 95/53, pulse 69, temperature 98.3 F (36.8 C), temperature source Oral, resp. rate 20, height 5\' 3"  (1.6 m), weight 61.4 kg, SpO2 100 %.        Intake/Output Summary (Last 24 hours) at 05/09/2021 1315 Last data filed at 05/09/2021 2409 Gross per 24 hour  Intake 82.42 ml  Output 500 ml  Net -417.58 ml   Filed Weights   05/05/21  1241 05/07/21 0600  Weight: 67.9 kg 61.4 kg    Examination: Resting in bed  On nasal cannula No respiratory distress Lungs are clear RRR HOH No focal deficits   Labs reviewed: Na 138 K 4.0 Cr 1.04 WBC 9.2 Hgb 8.0 'Heparin level low at 0.1   Echo Jan 27 reviewed - LVEF <20%, global hypokinesis  Resolved Hospital Problem list   Supratherapeutic INR on admission  Assessment & Plan:  Subdural hematoma without coma --Neurosurgery following, appreciate rec -- s/p MMA embolization on 1/28 -- plan is for repeat CT head once her heparin level is therapeutic. If stable can start warfarin and transfer out of ICU.   Severe biventricular heart failure Severe MR s/p mechanical MVR Echo on 07/22/2020 showed EF 20 to 25%, severely hypokinetic LV wall, moderately elevated PASP, stable MVR with mild regurg.  On room air.  Remains euvolemic on exam. -- Encourage p.o. intake --Continue Coreg --Strict I&O's, daily weights -- Continue to hold Lasix, Imdur and hydralazine -- heparin gtt no bolus, will need this bridge with warfarin which will be started once CT head is stable  COVID-19 positive Incidental finding on admission. On room air vs 2LNC.    --Continue precautions --CTM  AKI on CKD3B Baseline creatinine around 1.3-1.4. Mild AKI likely due to decreased fluid intake.  Creatinine back to baseline. --Gentle fluid hydration --Encourage p.o. intake --Avoid nephrotoxic's agent  HTN HLD SBP remains stable in the 110s-130s.  LDL goal at 65. --Continue  Coreg  --Continue on atorvastatin --Hold Imdur and hydralazine as above. Resume as appropriate for BP control   Protein calorie malnutrition Functional decline Tolerating diet so far. Continue Ensure --RD following, appreciate rec - PT/OT  Hyperglycemia A1c 5.4.  CBGs remained stable and at goal. -- SSI, moderate --CBG monitoring   GOC / Dispo Some concerns about patient's living situation and ability to care for self.   Son and daughter-in-law has agreed to take patient back to [place] to live with them after discharge.  -- TOC following  Best Practice (right click and "Reselect all SmartList Selections" daily)   Diet/type: Regular consistency (see orders) DVT prophylaxis: not indicated GI prophylaxis: N/A Lines: N/A Foley:  N/A Code Status:  full code Last date of multidisciplinary goals of care discussion [1/27 family meeting with palliative care and ccm at bedside. Continue full measures for now. Plan is to help patient transition to home with family out of state]  Labs   CBC: Recent Labs  Lab 05/05/21 1300 05/06/21 0504 05/07/21 0042 05/08/21 0122 05/09/21 0606  WBC 11.2* 13.6* 13.0* 9.5 9.2  NEUTROABS 9.5*  --   --   --   --   HGB 10.4* 9.4* 9.3* 8.5* 8.0*  HCT 32.1* 30.0* 29.0* 26.2* 24.8*  MCV 100.0 98.0 98.6 100.0 100.4*  PLT 302 281 253 237 222    Basic Metabolic Panel: Recent Labs  Lab 05/05/21 1300 05/06/21 0504 05/07/21 0042 05/08/21 0122 05/09/21 0606  NA 139 140 138 141 138  K 4.2 3.7 3.8 3.9 4.0  CL 101 104 105 107 105  CO2 27 28 26 26 26   GLUCOSE 181* 219* 150* 107* 119*  BUN 29* 32* 30* 19 18  CREATININE 1.39* 1.46* 1.13* 0.90 1.04*  CALCIUM 8.7* 8.3* 8.1* 8.0* 7.8*  MG  --   --  1.6* 2.2  --    GFR: Estimated Creatinine Clearance: 37.5 mL/min (A) (by C-G formula based on SCr of 1.04 mg/dL (H)). Recent Labs  Lab 05/05/21 1300 05/06/21 0504 05/07/21 0042 05/08/21 0122 05/09/21 0606  WBC 11.2* 13.6* 13.0* 9.5 9.2  LATICACIDVEN 2.8*  --   --   --   --     Liver Function Tests: Recent Labs  Lab 05/05/21 1300  AST 40  ALT 24  ALKPHOS 65  BILITOT 1.2  PROT 6.3*  ALBUMIN 3.2*   No results for input(s): LIPASE, AMYLASE in the last 168 hours. Recent Labs  Lab 05/05/21 1300  AMMONIA 25    ABG    Component Value Date/Time   HCO3 28.2 (H) 12/07/2006 1925   TCO2 28 05/10/2011 1420     Coagulation Profile: Recent Labs  Lab 05/05/21 2309  05/06/21 0504 05/07/21 0042 05/08/21 0122 05/09/21 0606  INR 1.3* 1.1 1.1 1.2 1.4*    Cardiac Enzymes: Recent Labs  Lab 05/05/21 1300  CKTOTAL 1,011*    HbA1C: Hgb A1c MFr Bld  Date/Time Value Ref Range Status  05/06/2021 09:06 AM 5.4 4.8 - 5.6 % Final    Comment:    (NOTE)         Prediabetes: 5.7 - 6.4         Diabetes: >6.4         Glycemic control for adults with diabetes: <7.0   12/04/2014 12:12 PM 5.9 (H) 4.8 - 5.6 % Final    Comment:    (NOTE)         Pre-diabetes: 5.7 - 6.4  Diabetes: >6.4         Glycemic control for adults with diabetes: <7.0     CBG: Recent Labs  Lab 05/08/21 1124 05/08/21 1528 05/08/21 1940 05/09/21 0747 05/09/21 1117  GLUCAP 197* 185* 99 124* 136*     Critical care time: 46    The patient is critically ill due to intracranial hemorrhage, risk for decompensation.  Critical care was necessary to treat or prevent imminent or life-threatening deterioration.  Critical care was time spent personally by me on the following activities: development of treatment plan with patient and/or surrogate as well as nursing, discussions with consultants, evaluation of patient's response to treatment, examination of patient, obtaining history from patient or surrogate, ordering and performing treatments and interventions, ordering and review of laboratory studies, ordering and review of radiographic studies, pulse oximetry, re-evaluation of patient's condition and participation in multidisciplinary rounds.   Critical Care Time devoted to patient care services described in this note is 31 minutes. This time reflects time of care of this signee Charlott Holler . This critical care time does not reflect separately billable procedures or procedure time, teaching time or supervisory time of PA/NP/Med student/Med Resident etc but could involve care discussion time.       Charlott Holler [facility] 05/09/2021 1:15 PM   Pager: see AMION  If no response to pager , please call critical care on call (see AMION) until 7pm After 7:00 pm call Elink

## 2021-05-09 NOTE — Progress Notes (Addendum)
ANTICOAGULATION CONSULT NOTE   Pharmacy Consult for Heparin IV Indication:  Mechanical valve  Allergies  Allergen Reactions   Strawberry Extract Rash    Rash & itching.    Patient Measurements: Height: 5\' 3"  (160 cm) Weight: 61.4 kg (135 lb 5.8 oz) IBW/kg (Calculated) : 52.4 Heparin Dosing Weight: 61.4 kg  Vital Signs: Temp: 98.8 F (37.1 C) (01/29 0330) Temp Source: Oral (01/29 0330) Pulse Rate: 63 (01/29 0715)  Labs: Recent Labs    05/07/21 0042 05/08/21 0122 05/08/21 2200 05/09/21 0606 05/09/21 0607  HGB 9.3* 8.5*  --  8.0*  --   HCT 29.0* 26.2*  --  24.8*  --   PLT 253 237  --  222  --   APTT 35  --   --   --   --   LABPROT 14.6 15.5*  --  17.0*  --   INR 1.1 1.2  --  1.4*  --   HEPARINUNFRC  --   --  <0.10*  --  <0.10*  CREATININE 1.13* 0.90  --  1.04*  --     Estimated Creatinine Clearance: 37.5 mL/min (A) (by C-G formula based on SCr of 1.04 mg/dL (H)).   Medical History: Past Medical History:  Diagnosis Date   Acute gastric ulcer without hemorrhage or perforation    AICD (automatic cardioverter/defibrillator) present    Aortic stenosis, mild    mean gradient 05/11/21 echo 09/09   CHF (congestive heart failure) (HCC)    --Non-ischemic CM EF 30-35% by echo 9/09 (Previous 25%)  --s/p St. Jude single chamber ICD 04/09 --Minimal  CAD by cath 2004 LAD 20% LCX 20% Ramus 30% RCA nl --echo 05/11 EF 20-25%   Diabetes mellitus    type 2   Hyperlipidemia    Hypertension    Kidney stones    MR (congenital mitral regurgitation)    Severe MR s/p St. Jude mechanical MVR by 07/11 2005    Medications:  Scheduled:   atorvastatin  40 mg Oral Daily   carvedilol  12.5 mg Oral BID WC   Chlorhexidine Gluconate Cloth  6 each Topical Daily   feeding supplement (GLUCERNA SHAKE)  237 mL Oral TID BM   hydrALAZINE  25 mg Oral TID   insulin aspart  0-15 Units Subcutaneous TID WC   isosorbide mononitrate  30 mg Oral Daily   pantoprazole  40 mg Oral Daily   sodium  chloride flush  3 mL Intravenous Q12H   Infusions:   heparin 700 Units/hr (05/08/21 2235)  none  Assessment: 76 years of age female with history significant for congenital MR with St Judes mechanical valve on chronic warfarin who was admitted for AMS and weakness found to have INR >10 and bilateral SDH who received reversal of anticoagulation with Kcentra and Vitamin K IV on 1/25 and is now status post Onxy embolization of bilateral middle meningeal arteries on 1/27.   Repeat CT Head this AM is stable. Ok per Neurosurgery to resume anticoagulation with IV Heparin without boluses. Pharmacy consulted to start IV Heparin (no bolus). Hgb down to 8.5 today. Platelets stable. INR currently at 1.2. No overt signs of bleeding. Patient reported mild headache this AM.   Plan per Neurosurgery - once patient is therapeutic on IV Heparin plan to repeat CT Head and if remains stable, then resume Warfarin.  AM update: Heparin level still undetectable after conservative increase to 700 units/hr. Remains alert and oriented x4. No change in neurologic assessment. Hgb 8 -  low, slow trend down. Platelets are within normal limits and stable. INR noted to have trended up some at 1.4. No overt bleeding reported.   Goal of Therapy:  Heparin level 0.3 to 0.5 units/ml Monitor platelets by anticoagulation protocol: Yes   Plan:  Increase IV heparin to 850 units/hr. Heparin level in 8 hours.  Daily PT/INR, heparin level, and CBC.  Monitor for any signs or symptoms of bleeding and mental status changes.  Once therapeutic on IV Heparin, follow-up repeat Head CT to start Warfarin. Will need overlap.   Reece Leader, Colon Flattery, BCCP Clinical Pharmacist  05/09/2021 7:24 AM   [facility] pharmacy phone numbers are listed on [email or web address]

## 2021-05-09 NOTE — Anesthesia Postprocedure Evaluation (Signed)
Anesthesia Post Note  Patient: Deborah [name]  Procedure(s) Performed: IR WITH ANESTHESIA EMBOLIZATION     Patient location during evaluation: ICU Anesthesia Type: General Level of consciousness: awake and alert Pain management: pain level controlled Vital Signs Assessment: post-procedure vital signs reviewed and stable Respiratory status: spontaneous breathing, nonlabored ventilation, respiratory function stable and patient connected to nasal cannula oxygen Cardiovascular status: blood pressure returned to baseline and stable Postop Assessment: no apparent nausea or vomiting Anesthetic complications: no   No notable events documented.  Last Vitals:  Vitals:   05/09/21 0757 05/09/21 0800  BP: 119/66   Pulse: 68   Resp:    Temp:  36.8 C  SpO2:      Last Pain:  Vitals:   05/09/21 0800  TempSrc: Oral  PainSc:                  Jayuya S

## 2021-05-10 ENCOUNTER — Inpatient Hospital Stay (HOSPITAL_COMMUNITY): Payer: Medicare Other

## 2021-05-10 ENCOUNTER — Encounter (HOSPITAL_COMMUNITY): Payer: Self-pay | Admitting: Neurosurgery

## 2021-05-10 DIAGNOSIS — E43 Unspecified severe protein-calorie malnutrition: Secondary | ICD-10-CM | POA: Diagnosis not present

## 2021-05-10 DIAGNOSIS — S065X0A Traumatic subdural hemorrhage without loss of consciousness, initial encounter: Secondary | ICD-10-CM | POA: Diagnosis not present

## 2021-05-10 DIAGNOSIS — T45515A Adverse effect of anticoagulants, initial encounter: Secondary | ICD-10-CM | POA: Diagnosis not present

## 2021-05-10 DIAGNOSIS — D6832 Hemorrhagic disorder due to extrinsic circulating anticoagulants: Secondary | ICD-10-CM | POA: Diagnosis not present

## 2021-05-10 DIAGNOSIS — N1832 Chronic kidney disease, stage 3b: Secondary | ICD-10-CM

## 2021-05-10 DIAGNOSIS — I5022 Chronic systolic (congestive) heart failure: Secondary | ICD-10-CM | POA: Diagnosis not present

## 2021-05-10 DIAGNOSIS — S065XAA Traumatic subdural hemorrhage with loss of consciousness status unknown, initial encounter: Secondary | ICD-10-CM | POA: Diagnosis not present

## 2021-05-10 DIAGNOSIS — I059 Rheumatic mitral valve disease, unspecified: Secondary | ICD-10-CM | POA: Diagnosis not present

## 2021-05-10 LAB — BASIC METABOLIC PANEL
Anion gap: 7 (ref 5–15)
Anion gap: 6 (ref 5–15)
BUN: 23 mg/dL (ref 8–23)
BUN: 19 mg/dL (ref 8–23)
CO2: 26 mmol/L (ref 22–32)
CO2: 27 mmol/L (ref 22–32)
Calcium: 8.1 mg/dL — ABNORMAL LOW (ref 8.9–10.3)
Calcium: 8.4 mg/dL — ABNORMAL LOW (ref 8.9–10.3)
Chloride: 105 mmol/L (ref 98–111)
Chloride: 104 mmol/L (ref 98–111)
Creatinine, Ser: 1.4 mg/dL — ABNORMAL HIGH (ref 0.44–1.00)
Creatinine, Ser: 1 mg/dL (ref 0.44–1.00)
GFR, Estimated: 39 mL/min — ABNORMAL LOW (ref >60)
GFR, Estimated: 58 mL/min — ABNORMAL LOW (ref >60)
Glucose, Bld: 159 mg/dL — ABNORMAL HIGH (ref 70–99)
Glucose, Bld: 153 mg/dL — ABNORMAL HIGH (ref 70–99)
Potassium: 4.3 mmol/L (ref 3.5–5.1)
Potassium: 4.1 mmol/L (ref 3.5–5.1)
Sodium: 138 mmol/L (ref 135–145)
Sodium: 137 mmol/L (ref 135–145)

## 2021-05-10 LAB — URINALYSIS, ROUTINE W REFLEX MICROSCOPIC

## 2021-05-10 LAB — CBC
HCT: 25.1 % — ABNORMAL LOW (ref 36.0–46.0)
HCT: 24.7 % — ABNORMAL LOW (ref 36.0–46.0)
Hemoglobin: 7.8 g/dL — ABNORMAL LOW (ref 12.0–15.0)
Hemoglobin: 8 g/dL — ABNORMAL LOW (ref 12.0–15.0)
MCH: 31.7 pg (ref 26.0–34.0)
MCH: 32.3 pg (ref 26.0–34.0)
MCHC: 31.1 g/dL (ref 30.0–36.0)
MCHC: 32.4 g/dL (ref 30.0–36.0)
MCV: 102 fL — ABNORMAL HIGH (ref 80.0–100.0)
MCV: 99.6 fL (ref 80.0–100.0)
Platelets: 207 10*3/uL (ref 150–400)
Platelets: 214 10*3/uL (ref 150–400)
RBC: 2.46 MIL/uL — ABNORMAL LOW (ref 3.87–5.11)
RBC: 2.48 MIL/uL — ABNORMAL LOW (ref 3.87–5.11)
RDW: 13.8 % (ref 11.5–15.5)
RDW: 13.7 % (ref 11.5–15.5)
WBC: 10.2 10*3/uL (ref 4.0–10.5)
WBC: 9.5 10*3/uL (ref 4.0–10.5)
nRBC: 0 % (ref 0.0–0.2)
nRBC: 0 % (ref 0.0–0.2)

## 2021-05-10 LAB — TYPE AND SCREEN
ABO/RH(D): O POS
Antibody Screen: NEGATIVE

## 2021-05-10 LAB — CULTURE, BLOOD (ROUTINE X 2)
Culture: NO GROWTH
Culture: NO GROWTH
Special Requests: ADEQUATE

## 2021-05-10 LAB — MAGNESIUM: Magnesium: 1.8 mg/dL (ref 1.7–2.4)

## 2021-05-10 LAB — GLUCOSE, CAPILLARY
Glucose-Capillary: 146 mg/dL — ABNORMAL HIGH (ref 70–99)
Glucose-Capillary: 186 mg/dL — ABNORMAL HIGH (ref 70–99)
Glucose-Capillary: 180 mg/dL — ABNORMAL HIGH (ref 70–99)
Glucose-Capillary: 176 mg/dL — ABNORMAL HIGH (ref 70–99)

## 2021-05-10 LAB — URINALYSIS, MICROSCOPIC (REFLEX): RBC / HPF: >50 RBC/hpf (ref 0–5)

## 2021-05-10 LAB — PROTIME-INR
INR: 1.3 — ABNORMAL HIGH (ref 0.8–1.2)
Prothrombin Time: 16.4 seconds — ABNORMAL HIGH (ref 11.4–15.2)

## 2021-05-10 LAB — HEPARIN LEVEL (UNFRACTIONATED)
Heparin Unfractionated: <0.1 IU/mL — ABNORMAL LOW (ref 0.30–0.70)
Heparin Unfractionated: 0.38 IU/mL (ref 0.30–0.70)
Heparin Unfractionated: 0.42 IU/mL (ref 0.30–0.70)

## 2021-05-10 MED ORDER — FENTANYL CITRATE (PF) 100 MCG/2ML IJ SOLN
25.0000 ug | INTRAMUSCULAR | Status: DC | PRN
  Administered 2021-05-11: 25 ug via INTRAVENOUS
  Filled 2021-05-10: qty 2

## 2021-05-10 MED ORDER — IOHEXOL 300 MG/ML  SOLN
80.0000 mL | Freq: Once | INTRAMUSCULAR | Status: AC | PRN
  Administered 2021-05-10: 80 mL via INTRAVENOUS

## 2021-05-10 MED ORDER — SODIUM CHLORIDE 0.9 % IV SOLN
1.0000 g | INTRAVENOUS | Status: DC
  Administered 2021-05-10 – 2021-05-12 (×3): 1 g via INTRAVENOUS
  Filled 2021-05-10 (×3): qty 10

## 2021-05-10 NOTE — TOC Initial Note (Addendum)
Transition of Care [facility]) - Initial/Assessment Note    Patient Details  Name: Deborah [name] MRN: [id number] Date of Birth: [birth year]  [facility]) CM/SW Contact:    Elliot Cousin, RN Phone Number: [phone] 05/10/2021, 11:41 AM  Clinical Narrative:                 HF TOC CM spoke to pt's son, Sharion Balloon and states plan to receive SNF rehab in [place] if needed prior to permanent moving to [place] to his home. Will need PT/OT evaluation for recommendations.   Expected Discharge Plan: Skilled Nursing Facility Barriers to Discharge: Continued Medical Work up   Patient Goals and CMS Choice Patient states their goals for this hospitalization and ongoing recovery are:: wants pt to receive rehab prior to moving to [facility].gov Compare Post Acute Care list provided to:: Patient Represenative (must comment) (son, Kem Boroughs) Choice offered to / list presented to : Adult Children  Expected Discharge Plan and Services Expected Discharge Plan: Skilled Nursing Facility In-house Referral: Clinical Social Work Discharge Planning Services: CM Consult Post Acute Care Choice: Skilled Nursing Facility Living arrangements for the past 2 months: Apartment   Prior Living Arrangements/Services Living arrangements for the past 2 months: Apartment Lives with:: Self Patient language and need for interpreter reviewed:: Yes Do you feel safe going back to the place where you live?: No   will need assistance at home  Need for Family Participation in Patient Care: Yes (Comment) Care giver support system in place?: Yes (comment)   Criminal Activity/Legal Involvement Pertinent to Current Situation/Hospitalization: No - Comment as needed  Activities of Daily Living      Permission Sought/Granted Permission sought to share information with : Case Manager, Family Supports, PCP Permission granted to share information with : Yes, Verbal Permission Granted  Share  Information with NAME: Kem Boroughs  Permission granted to share info w AGENCY: SNF rehab  Permission granted to share info w Relationship: son  Permission granted to share info w Contact Information: [phone]  Emotional Assessment           Psych Involvement: No (comment)  Admission diagnosis:  Subdural hematoma without coma [S06.5XAA] Patient Active Problem List   Diagnosis Date Noted   Malnutrition of moderate degree 05/06/2021   Subdural hematoma without coma 05/05/2021   Failure to thrive in adult 05/05/2021   Severe protein-calorie malnutrition (HCC) 05/05/2021   Recurrent falls 05/05/2021   Warfarin-induced coagulopathy (HCC)    Acute on chronic anemia 07/21/2020   AKI (acute kidney injury) (HCC) 07/21/2020   Lactic acidosis 07/21/2020   Diabetes type 2, controlled (HCC)    Chronic systolic CHF (congestive heart failure) (HCC)    Pulmonary hypertension (HCC)    HLD (hyperlipidemia)    ICD-St.Jude 08/02/2010   COPD, MILD 11/19/2009   Aortic valve disorder 07/30/2008   HYPERTENSION, BENIGN 01/24/2008   Mitral valve disorder. (Status post mechanical valve replacement) 01/24/2008   PCP:  Georgianne Fick, MD Pharmacy:   [facility] [phone] [place] Otto, [place] - [address] [place] [address] Phone: [phone] Fax: [phone]     Social Determinants of Health (SDOH) Interventions    Readmission Risk Interventions No flowsheet data found.

## 2021-05-10 NOTE — Progress Notes (Signed)
ANTICOAGULATION CONSULT NOTE   Pharmacy Consult for Heparin IV Indication:  Mechanical valve  Allergies  Allergen Reactions   Strawberry Extract Rash    Rash & itching.    Patient Measurements: Height: 5\' 3"  (160 cm) Weight: 61.4 kg (135 lb 5.8 oz) IBW/kg (Calculated) : 52.4 Heparin Dosing Weight: 61.4 kg  Vital Signs: Temp: 98.2 F (36.8 C) (01/30 1900) Temp Source: Oral (01/30 1900) BP: 135/79 (01/30 2100) Pulse Rate: 90 (01/30 2100)  Labs: Recent Labs    05/08/21 0122 05/08/21 2200 05/09/21 0606 05/09/21 0607 05/10/21 0140 05/10/21 1214 05/10/21 1321 05/10/21 1933 05/10/21 2200  HGB 8.5*  --  8.0*  --  7.8*  --  8.0*  --   --   HCT 26.2*  --  24.8*  --  25.1*  --  24.7*  --   --   PLT 237  --  222  --  207  --  214  --   --   LABPROT 15.5*  --  17.0*  --  16.4*  --   --   --   --   INR 1.2  --  1.4*  --  1.3*  --   --   --   --   HEPARINUNFRC  --    < >  --    < > <0.10* 0.38  --   --  0.42  CREATININE 0.90  --  1.04*  --  1.40*  --   --  1.00  --    < > = values in this interval not displayed.    Estimated Creatinine Clearance: 39 mL/min (by C-G formula based on SCr of 1 mg/dL).   Medical History: Past Medical History:  Diagnosis Date   Acute gastric ulcer without hemorrhage or perforation    AICD (automatic cardioverter/defibrillator) present    Aortic stenosis, mild    mean gradient 40mmHg echo 09/09   CHF (congestive heart failure) (HCC)    --Non-ischemic CM EF 30-35% by echo 9/09 (Previous 25%)  --s/p St. Jude single chamber ICD 04/09 --Minimal  CAD by cath 2004 LAD 20% LCX 20% Ramus 30% RCA nl --echo 05/11 EF 20-25%   Diabetes mellitus    type 2   Hyperlipidemia    Hypertension    Kidney stones    MR (congenital mitral regurgitation)    Severe MR s/p St. Jude mechanical MVR by [facility] 2005    Medications:  Scheduled:   atorvastatin  40 mg Oral Daily   carvedilol  12.5 mg Oral BID WC   Chlorhexidine Gluconate Cloth  6 each Topical  Daily   feeding supplement (GLUCERNA SHAKE)  237 mL Oral TID BM   hydrALAZINE  25 mg Oral TID   insulin aspart  0-15 Units Subcutaneous TID WC   isosorbide mononitrate  30 mg Oral Daily   pantoprazole  40 mg Oral Daily   sodium chloride flush  3 mL Intravenous Q12H   Infusions:   cefTRIAXone (ROCEPHIN)  IV Stopped (05/10/21 1248)   heparin 1,100 Units/hr (05/10/21 2000)  none  Assessment: 76 years of age female with history significant for congenital MR with St Judes mechanical valve 2005 on chronic warfarin who was admitted for AMS and weakness found to have INR >10 and bilateral SDH who received reversal of anticoagulation with Kcentra and Vitamin K IV on 1/25 and is now status post Onxy embolization of bilateral middle meningeal arteries on 1/27. Repeat CT Head is stable. Ok per Neurosurgery to  resume anticoagulation with IV Heparin without boluses.   Plan per Neurosurgery - once patient is therapeutic on IV Heparin plan to repeat CT Head and if remains stable, then resume Warfarin.  Heparin level 0.42 is therapeutic on 1100 units/hr and slightly up trending. Per RN, still with hematuria but somewhat improved. No urological intervention planned at this time. Will decrease slightly to target low end of goal given SDH and hematuria.     Goal of Therapy:  Heparin level 0.3 to 0.5 units/ml Monitor platelets by anticoagulation protocol: Yes   Plan:  Decrease heparin slightly to 1050 units/hr  Monitor heparin level, CBC and s/s of bleeding daily   Benetta Spar, PharmD, BCPS, [facility] Clinical Pharmacist  Please check AMION for all [place] phone numbers After 10:00 PM, call [place] [phone]

## 2021-05-10 NOTE — Progress Notes (Signed)
Physical Therapy Treatment Patient Details Name: Deborah [name] MRN: [id number] DOB: 1945/05/19 Today's Date: 05/10/2021   History of Present Illness 76 yo admitted 1/25 after found on bathroom floor by family. Pt with bifrontal and acute parafalcine SDH, pt denies fall. s/p 1/27 Onyx embolization of bilateral middle meningeal arteries PMHx: CHF, FTT, HOH, DM, HLD, HTN    PT Comments    Pt supine in bed with family in room on entrance. First PT session s/p embolization process. Pt is mod A for bed mobility, and coming to seated EoB. Once at [facility] pt able to participate in LE exercise however requires increase multimodal cuing. Session abbreviated due to need for transport to CT. Pt is modA for coming to standing and min A for stepping to HoB to position hips up in bed. Given pt decreased strength and endurance pt will likely need SNF level rehab prior to discharge to sons home in [place].      Recommendations for follow up therapy are one component of a multi-disciplinary discharge planning process, led by the attending physician.  Recommendations may be updated based on patient status, additional functional criteria and insurance authorization.  Follow Up Recommendations  Skilled nursing-short term rehab (<3 hours/day)     Assistance Recommended at Discharge Frequent or constant Supervision/Assistance  Patient can return home with the following A little help with walking and/or transfers;Help with stairs or ramp for entrance;Assistance with cooking/housework;Direct supervision/assist for financial management;A lot of help with bathing/dressing/bathroom;Direct supervision/assist for medications management;Assist for transportation   Equipment Recommendations  Rolling walker (2 wheels);BSC/3in1       Precautions / Restrictions Precautions Precautions: Fall Restrictions Weight Bearing Restrictions: No     Mobility  Bed Mobility Overal bed mobility: Needs Assistance Bed Mobility: Supine  to Sit     Supine to sit: Mod assist, HOB elevated     General bed mobility comments: modA for LE off bed and trunk to upright    Transfers Overall transfer level: Needs assistance Equipment used: 1 person hand held assist Transfers: Sit to/from Stand Sit to Stand: Mod assist           General transfer comment: modA for power up and steadying in standing    Ambulation/Gait Ambulation/Gait assistance: Min assist Gait Distance (Feet): 2 Feet Assistive device: Rolling walker (2 wheels), 1 person hand held assist Gait Pattern/deviations: Decreased stride length, Step-to pattern   Gait velocity interpretation: <1.31 ft/sec, indicative of household ambulator          Modified Rankin (Stroke Patients Only) Modified Rankin (Stroke Patients Only) Pre-Morbid Rankin Score: No symptoms Modified Rankin: Moderately severe disability     Balance Overall balance assessment: Needs assistance, History of Falls Sitting-balance support: No upper extremity supported, Feet supported Sitting balance-Leahy Scale: Fair     Standing balance support: Bilateral upper extremity supported Standing balance-Leahy Scale: Poor Standing balance comment: RW support in standing and for gait                            Cognition Arousal/Alertness: Awake/alert Behavior During Therapy: Flat affect                               Awareness: Intellectual Problem Solving: Slow processing General Comments: Lost one hearing aide with bathing so now has even more difficulty with hearing        Exercises General Exercises - Lower Extremity  Ankle Circles/Pumps: AROM, Both, 10 reps, Seated Long Arc Quad: AROM, Both, 5 reps, Seated Hip Flexion/Marching: AROM, 5 reps, Seated    General Comments General comments (skin integrity, edema, etc.): VSS on RA      Pertinent Vitals/Pain Pain Assessment Pain Assessment: Faces Faces Pain Scale: Hurts a little bit Pain Location:  urge to urinate Pain Intervention(s): Limited activity within patient's tolerance, Monitored during session, Repositioned     PT Goals (current goals can now be found in the care plan section) Acute Rehab PT Goals Patient Stated Goal: be independent PT Goal Formulation: With patient/family Time For Goal Achievement: 05/20/21 Potential to Achieve Goals: Good Progress towards PT goals: Progressing toward goals    Frequency    Min 3X/week      PT Plan Discharge plan needs to be updated       AM-PAC PT "6 Clicks" Mobility   Outcome Measure  Help needed turning from your back to your side while in a flat bed without using bedrails?: A Little Help needed moving from lying on your back to sitting on the side of a flat bed without using bedrails?: A Little Help needed moving to and from a bed to a chair (including a wheelchair)?: A Little Help needed standing up from a chair using your arms (e.g., wheelchair or bedside chair)?: A Little Help needed to walk in hospital room?: A Little Help needed climbing 3-5 steps with a railing? : A Lot 6 Click Score: 17    End of Session Equipment Utilized During Treatment: Gait belt Activity Tolerance: Patient tolerated treatment well Patient left: in chair;with call bell/phone within reach;with chair alarm set;with nursing/sitter in room;with family/visitor present Nurse Communication: Mobility status PT Visit Diagnosis: Other abnormalities of gait and mobility (R26.89);Difficulty in walking, not elsewhere classified (R26.2);Muscle weakness (generalized) (M62.81)     Time: 3614-4315 PT Time Calculation (min) (ACUTE ONLY): 15 min  Charges:  $Therapeutic Activity: 8-22 mins                     Deborah [name] B. Beverely Risen PT, DPT Acute Rehabilitation Services Pager [phone] Office [phone]    Elon Alas Fleet 05/10/2021, 4:51 PM

## 2021-05-10 NOTE — Progress Notes (Signed)
This chaplain was updated by PMT on the status of notary consult for Pt. Advance Directive.  The chaplain understands the Pt. son is guiding the AD process and will contact spiritual care as needed.  Chaplain Sallyanne Kuster [phone]

## 2021-05-10 NOTE — Progress Notes (Signed)
[facility] Progress Note Patient Name: Deborah [name] DOB: [birth year] MRN: [id number]   Date of Service  05/10/2021  HPI/Events of Note  Agitation - Patient trying to get out of bed. Nursing request for bilateral soft wrist restraints and PRN sedation. Patient on Norco PRN at home.  eICU Interventions  Plan: Bilateral soft wrist restraints X 12 hours. Fentanyl 25 mcg IV Q 2 hours PRN severe pain or agitation.     Intervention Category Major Interventions: Delirium, psychosis, severe agitation - evaluation and management  Lysle Dingwall 05/10/2021, 8:43 PM

## 2021-05-10 NOTE — Progress Notes (Signed)
Son updated during his visit today.  Julian Hy, DO 05/10/21 1:20 PM [facility]

## 2021-05-10 NOTE — Progress Notes (Signed)
NAME:  Deborah [name], MRN:  [id number], DOB:  [birth year], LOS: 5 ADMISSION DATE:  05/05/2021, CONSULTATION DATE: 05/05/21 REFERRING MD: Dr. 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 on coumadin, HLD, DM, and anemia who presented to [facility] ER by EMS with altered mental status and weakness.   Patient lives alone.  Her sister was unable to reach her yesterday or today.  They went to check on her and she was found her on the bathroom floor.  EMS report notes that she stated she had lost control of her bladder and sat on the floor to clean her self but was too weak to get up.  Denied falling, dizziness, syncope, etc.  Also reports not eating/ drinking for 16 hours.  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 [facility] on coumadin, HLD, DM, anemia, prior GI bleed   Significant Hospital Events: Including procedures, antibiotic start and stop dates in addition to other pertinent events   1/25 Admitted, NSGY consulted, reversed with Theodoro Parma 1/25 CT head; sizable acute hemispheric subdural hematoma with mass-effect on right frontal lobe 1/26 CT head; progressive bilateral subdural hematoma with increased mass-effect on frontal lobe. 1/27 MMA embolization with nsgy 1/28 resumed heparin no bolus  Interim History / Subjective:  No acute events overnight Sleepy but oriented this AM No complaints  Objective   Blood pressure 116/80, pulse (!) 102, temperature 98 F (36.7 C), temperature source Oral, resp. rate 19, height 5\' 3"  (1.6 m), weight 61.4 kg, SpO2 100 %.        Intake/Output Summary (Last 24 hours) at 05/10/2021 0814 Last data filed at 05/10/2021 0500 Gross per 24 hour  Intake 454.9 ml  Output 240 ml  Net 214.9 ml   Filed Weights   05/05/21 1241 05/07/21 0600   Weight: 67.9 kg 61.4 kg    Examination: General: Well-appearing, frail elderly woman.  NAD HENT: [place]/AT. Moist mucous membranes Lungs: CTAB.  No wheezing or rales. Cardiovascular: RRR.  Mitral click murmur.  No rubs or gallops. Abdomen: Soft. NT/ND. Normal BS Extremities: Well perfused.  No edema. Neuro: Sleepy but oriented x3. Moves all extremities  Labs reviewed: CBGs: 146, 156, 177 PT/INR 16.4/1.3 WBC 10.2, hgb 7.8 Creatinine 1.4, K+ 4.3 Pending heparin levels  Resolved Hospital Problem list    Assessment & Plan:  Subdural hematoma without coma S/p MMA embolization on 1/28.  Neurologically stable.  Plan for repeat head CT was held for levels at the therapeutic range. --Neurosurgery following, appreciate rec --Pending CT head --Restart home warfarin once heparin level is therapeutic  Combined systolic and diastolic HF Severe MR s/p mechanical MVR Echo on 07/22/2020 showed EF 20 to 25%, severely hypokinetic LV wall, moderately elevated PASP, stable MVR with mild regurg. Euvolemic on exam. Stable O2 sats on RA this AM. --Continue Coreg, hydra and Imdur --Strict I&O's, daily weights --Heparin bridge to warfarin once stable CT head  Warfarin-induced coagulopathy Unsure if patient was taking warfarin.  INR has been reversed with Kcentra.  PT/INR 16.4/1.3 --Trend PT/INR --Plan to restart warfarin as above  COVID-19 positive Incidental finding on admission. On room air.    --Continue precautions --CTM  AKI on CKD3B Baseline creatinine around 1.3-1.4. Mild AKI likely due to decreased fluid intake.  Creatinine back to baseline. --  Gentle fluid hydration --Encourage p.o. intake --Avoid nephrotoxic's agent  HTN HLD BP remained stable with SBP in the 110s to 120s.  LDL at goal at 65. --Continue antihypertensives as above --Continue on atorvastatin   Protein calorie malnutrition Functional decline Tolerating diet so far. Continue Ensure --RD following, appreciate  rec  Hyperglycemia A1c 5.4.  CBGs remained stable and at goal. --SSI, moderate --CBG monitoring   GOC / Dispo Some concerns about patient's living situation and ability to care for self.  Son and daughter-in-law has agreed to take patient back to [place] to live with them after discharge.  -- TOC following  Best Practice (right click and "Reselect all SmartList Selections" daily)   Diet/type: Regular consistency (see orders) DVT prophylaxis: not indicated GI prophylaxis: N/A Lines: N/A Foley:  N/A Code Status:  full code Last date of multidisciplinary goals of care discussion [pending]  Labs   CBC: Recent Labs  Lab 05/05/21 1300 05/06/21 0504 05/07/21 0042 05/08/21 0122 05/09/21 0606 05/10/21 0140  WBC 11.2* 13.6* 13.0* 9.5 9.2 10.2  NEUTROABS 9.5*  --   --   --   --   --   HGB 10.4* 9.4* 9.3* 8.5* 8.0* 7.8*  HCT 32.1* 30.0* 29.0* 26.2* 24.8* 25.1*  MCV 100.0 98.0 98.6 100.0 100.4* 102.0*  PLT 302 281 253 237 222 207    Basic Metabolic Panel: Recent Labs  Lab 05/06/21 0504 05/07/21 0042 05/08/21 0122 05/09/21 0606 05/10/21 0140  NA 140 138 141 138 138  K 3.7 3.8 3.9 4.0 4.3  CL 104 105 107 105 105  CO2 28 26 26 26 26   GLUCOSE 219* 150* 107* 119* 159*  BUN 32* 30* 19 18 23   CREATININE 1.46* 1.13* 0.90 1.04* 1.40*  CALCIUM 8.3* 8.1* 8.0* 7.8* 8.1*  MG  --  1.6* 2.2  --   --    GFR: Estimated Creatinine Clearance: 27.8 mL/min (A) (by C-G formula based on SCr of 1.4 mg/dL (H)). Recent Labs  Lab 05/05/21 1300 05/06/21 0504 05/07/21 0042 05/08/21 0122 05/09/21 0606 05/10/21 0140  WBC 11.2*   < > 13.0* 9.5 9.2 10.2  LATICACIDVEN 2.8*  --   --   --   --   --    < > = values in this interval not displayed.    Liver Function Tests: Recent Labs  Lab 05/05/21 1300  AST 40  ALT 24  ALKPHOS 65  BILITOT 1.2  PROT 6.3*  ALBUMIN 3.2*   No results for input(s): LIPASE, AMYLASE in the last 168 hours. Recent Labs  Lab 05/05/21 1300  AMMONIA 25     ABG    Component Value Date/Time   HCO3 28.2 (H) 12/07/2006 1925   TCO2 28 05/10/2011 1420     Coagulation Profile: Recent Labs  Lab 05/06/21 0504 05/07/21 0042 05/08/21 0122 05/09/21 0606 05/10/21 0140  INR 1.1 1.1 1.2 1.4* 1.3*    Cardiac Enzymes: Recent Labs  Lab 05/05/21 1300  CKTOTAL 1,011*    HbA1C: Hgb A1c MFr Bld  Date/Time Value Ref Range Status  05/06/2021 09:06 AM 5.4 4.8 - 5.6 % Final    Comment:    (NOTE)         Prediabetes: 5.7 - 6.4         Diabetes: >6.4         Glycemic control for adults with diabetes: <7.0   12/04/2014 12:12 PM 5.9 (H) 4.8 - 5.6 % Final    Comment:    (NOTE)  Pre-diabetes: 5.7 - 6.4         Diabetes: >6.4         Glycemic control for adults with diabetes: <7.0     CBG: Recent Labs  Lab 05/09/21 0747 05/09/21 1117 05/09/21 1556 05/09/21 2108 05/10/21 0748  GLUCAP 124* 136* 177* 156* 146*     Critical care time: 30

## 2021-05-10 NOTE — Consult Note (Signed)
Urology Consult   Physician requesting consult: Karie Fetch  Reason for consult: Hematuria  History of Present Illness: Deborah [name] is a 76 y.o. female with PMH significant for AICD, CHF, DM II, HTN, CKD III (baseline Cr 1.3), kidney stones, mechanical MVR, and iron deficiency anemia who was admitted on 05/05/21 for eval and tx of a subdural hematoma obtained after a fall at home. Her INR was 10 on admission. Coumadin was reversed and she was taken for embolization of bilateral middle meningeal arteries on 05/07/21.  Foley was placed prior to procedure.  Heparin gtt started on 05/07/21 after procedure. Per pts son urine remained clear until this morning at which time dark, red urine was noted in the bag. UA on 05/05/21 nitrite negative, small leukocytes, few bacteria, 6-10 RBCs.  UA and culture ordered for today but have not yet been collected.  Pt started on Rocephin today for possible UTI.  Renal U/S with no calculi or hydro.   Pt is very hard of hearing and difficult to communicate with.  Per her son she was able to void on her own prior to admission and had never c/o hematuria in the past.  Review of previous UAs reveal microhematuria back to 2008.  Son is not aware of any issues with recurrent UTIs. Pt has been on anticoagulation since 2005. She has never been evaled by a urologist. She smoked in the 1980s.    Pt is Covid positive.   Past Medical History:  Diagnosis Date   Acute gastric ulcer without hemorrhage or perforation    AICD (automatic cardioverter/defibrillator) present    Aortic stenosis, mild    mean gradient echo 09/09   CHF (congestive heart failure) (HCC)    --Non-ischemic CM EF 30-35% by echo 9/09 (Previous 25%)  --s/p St. Jude single chamber ICD 04/09 --Minimal  CAD by cath 2004 LAD 20% LCX 20% Ramus 30% RCA nl --echo 05/11 EF 20-25%   Diabetes mellitus    type 2   Hyperlipidemia    Hypertension    Kidney stones    MR (congenital mitral regurgitation)    Severe  MR s/p St. Jude mechanical MVR by Gasper Lloyd 2005    Past Surgical History:  Procedure Laterality Date   ABDOMINAL HYSTERECTOMY     For menorrhagia and.  Associated severe pain.  Sounds like she had fibroids.   APPENDECTOMY  1979   BIOPSY  07/24/2020   Procedure: BIOPSY;  Surgeon: Beverley Fiedler, MD;  Location: [facility] ENDOSCOPY;  Service: Gastroenterology;;   CARDIAC DEFIBRILLATOR PLACEMENT  2009   CARDIAC VALVE SURGERY  2005   ESOPHAGOGASTRODUODENOSCOPY N/A 12/05/2014   Procedure: ESOPHAGOGASTRODUODENOSCOPY (EGD);  Surgeon: Dorena Cookey, MD;  Location: [facility] ENDOSCOPY;  Service: Endoscopy;  Laterality: N/A;   ESOPHAGOGASTRODUODENOSCOPY (EGD) WITH PROPOFOL N/A 07/24/2020   Procedure: ESOPHAGOGASTRODUODENOSCOPY (EGD) WITH PROPOFOL;  Surgeon: Beverley Fiedler, MD;  Location: [facility] ENDOSCOPY;  Service: Gastroenterology;  Laterality: N/A;   ICD GENERATOR CHANGEOUT N/A 08/01/2016   Procedure: ICD Generator Changeout;  Surgeon: Duke Salvia, MD;  Location: [facility] INVASIVE CV LAB;  Service: Cardiovascular;  Laterality: N/A;   INSERT / REPLACE / REMOVE PACEMAKER     IR ANGIO EXTERNAL CAROTID SEL EXT CAROTID BILAT MOD SED  05/07/2021   IR NEURO EACH ADD'L AFTER BASIC UNI LEFT (MS)  05/07/2021   IR NEURO EACH ADD'L AFTER BASIC UNI RIGHT (MS)  05/07/2021   IR TRANSCATH/EMBOLIZ  05/07/2021   RADIOLOGY WITH ANESTHESIA N/A 05/07/2021  Procedure: IR WITH ANESTHESIA EMBOLIZATION;  Surgeon: Consuella Lose, MD;  Location: [facility];  Service: Radiology;  Laterality: N/A;     Current Hospital Medications:  Home meds:  . Current Meds  Medication Sig   atorvastatin (LIPITOR) 40 MG tablet Take 1 tablet (40 mg total) by mouth daily.   carvedilol (COREG) 12.5 MG tablet Take 1 tablet (12.5 mg total) by mouth 2 (two) times daily with a meal.   HYDROcodone-acetaminophen (NORCO) 7.5-325 MG tablet Take 1 tablet by mouth 2 (two) times daily as needed for moderate pain. Expired. Said taken this month. Taken as needed for pain    ondansetron (ZOFRAN-ODT) 4 MG disintegrating tablet Take 4 mg by mouth every 8 (eight) hours as needed for nausea or vomiting.   temazepam (RESTORIL) 15 MG capsule Take 15 mg by mouth at bedtime as needed for sleep.   tiZANidine (ZANAFLEX) 2 MG tablet Take 2 mg by mouth at bedtime. Takes at night as needed   warfarin (COUMADIN) 5 MG tablet Take 1 and 1/2 tablets by mouth daily as directed by the coumadin clinic     Scheduled Meds:  atorvastatin  40 mg Oral Daily   carvedilol  12.5 mg Oral BID WC   Chlorhexidine Gluconate Cloth  6 each Topical Daily   feeding supplement (GLUCERNA SHAKE)  237 mL Oral TID BM   hydrALAZINE  25 mg Oral TID   insulin aspart  0-15 Units Subcutaneous TID WC   isosorbide mononitrate  30 mg Oral Daily   pantoprazole  40 mg Oral Daily   sodium chloride flush  3 mL Intravenous Q12H   Continuous Infusions:  cefTRIAXone (ROCEPHIN)  IV 1 g (05/10/21 1210)   heparin 1,100 Units/hr (05/10/21 1330)   PRN Meds:.acetaminophen, ondansetron (ZOFRAN) IV, polyethylene glycol  Allergies:  Allergies  Allergen Reactions   Strawberry Extract Rash    Rash & itching.    Family History  Problem Relation Age of Onset   Heart disease Mother     Social History:  reports that she quit smoking about 36 years ago. Her smoking use included cigarettes. She has never used smokeless tobacco. She reports that she does not drink alcohol and does not use drugs.  ROS: A complete review of systems was not performed as pt was not able to hear well enough to provide accurate information.     Physical Exam:  Vital signs in last 24 hours: Temp:  [98 F (36.7 C)-98.7 F (37.1 C)] 98 F (36.7 C) (01/30 0750) Pulse Rate:  [65-203] 79 (01/30 1100) Resp:  [15-25] 25 (01/30 1100) BP: (92-144)/(45-102) 92/53 (01/30 1100) SpO2:  [91 %-100 %] 95 % (01/30 1100) Arterial Line BP: (109-144)/(41-78) 109/41 (01/30 1100) Constitutional:  Alert and oriented, No acute distress Cardiovascular:  Regular rate and rhythm Respiratory: Normal respiratory effort GI: Abdomen is soft, nontender, nondistended, no abdominal masses GU: Foley cath in place with dark red urine in tubing and bag Lymphatic: No lymphadenopathy Neurologic: Grossly intact, no focal deficits Psychiatric: Normal mood and affect  Laboratory Data:  Recent Labs    05/08/21 0122 05/09/21 0606 05/10/21 0140 05/10/21 1321  WBC 9.5 9.2 10.2 9.5  HGB 8.5* 8.0* 7.8* 8.0*  HCT 26.2* 24.8* 25.1* 24.7*  PLT 237 222 207 214    Recent Labs    05/08/21 0122 05/09/21 0606 05/10/21 0140  NA 141 138 138  K 3.9 4.0 4.3  CL 107 105 105  GLUCOSE 107* 119* 159*  BUN 19 18 23  CALCIUM 8.0* 7.8* 8.1*  CREATININE 0.90 1.04* 1.40*     Results for orders placed or performed during the hospital encounter of 05/05/21 (from the past 24 hour(s))  Heparin level (unfractionated)     Status: Abnormal   Collection Time: 05/09/21  3:02 PM  Result Value Ref Range   Heparin Unfractionated <0.10 (L) 0.30 - 0.70 IU/mL  Glucose, capillary     Status: Abnormal   Collection Time: 05/09/21  3:56 PM  Result Value Ref Range   Glucose-Capillary 177 (H) 70 - 99 mg/dL  Glucose, capillary     Status: Abnormal   Collection Time: 05/09/21  9:08 PM  Result Value Ref Range   Glucose-Capillary 156 (H) 70 - 99 mg/dL  Heparin level (unfractionated)     Status: Abnormal   Collection Time: 05/10/21  1:40 AM  Result Value Ref Range   Heparin Unfractionated <0.10 (L) 0.30 - 0.70 IU/mL  Basic metabolic panel     Status: Abnormal   Collection Time: 05/10/21  1:40 AM  Result Value Ref Range   Sodium 138 135 - 145 mmol/L   Potassium 4.3 3.5 - 5.1 mmol/L   Chloride 105 98 - 111 mmol/L   CO2 26 22 - 32 mmol/L   Glucose, Bld 159 (H) 70 - 99 mg/dL   BUN 23 8 - 23 mg/dL   Creatinine, Ser 1.40 (H) 0.44 - 1.00 mg/dL   Calcium 8.1 (L) 8.9 - 10.3 mg/dL   GFR, Estimated 39 (L) >60 mL/min   Anion gap 7 5 - 15  CBC     Status: Abnormal   Collection  Time: 05/10/21  1:40 AM  Result Value Ref Range   WBC 10.2 4.0 - 10.5 K/uL   RBC 2.46 (L) 3.87 - 5.11 MIL/uL   Hemoglobin 7.8 (L) 12.0 - 15.0 g/dL   HCT 25.1 (L) 36.0 - 46.0 %   MCV 102.0 (H) 80.0 - 100.0 fL   MCH 31.7 26.0 - 34.0 pg   MCHC 31.1 30.0 - 36.0 g/dL   RDW 13.8 11.5 - 15.5 %   Platelets 207 150 - 400 K/uL   nRBC 0.0 0.0 - 0.2 %  Protime-INR     Status: Abnormal   Collection Time: 05/10/21  1:40 AM  Result Value Ref Range   Prothrombin Time 16.4 (H) 11.4 - 15.2 seconds   INR 1.3 (H) 0.8 - 1.2  Glucose, capillary     Status: Abnormal   Collection Time: 05/10/21  7:48 AM  Result Value Ref Range   Glucose-Capillary 146 (H) 70 - 99 mg/dL  Glucose, capillary     Status: Abnormal   Collection Time: 05/10/21 11:52 AM  Result Value Ref Range   Glucose-Capillary 186 (H) 70 - 99 mg/dL  Type and screen [place]     Status: None (Preliminary result)   Collection Time: 05/10/21 12:11 PM  Result Value Ref Range   ABO/RH(D) PENDING    Antibody Screen PENDING    Sample Expiration      05/13/2021,2359 Performed at [facility], [address], [place], [place] [address]   Heparin level (unfractionated)     Status: None   Collection Time: 05/10/21 12:14 PM  Result Value Ref Range   Heparin Unfractionated 0.38 0.30 - 0.70 IU/mL  CBC     Status: Abnormal   Collection Time: 05/10/21  1:21 PM  Result Value Ref Range   WBC 9.5 4.0 - 10.5 K/uL   RBC 2.48 (L)  3.87 - 5.11 MIL/uL   Hemoglobin 8.0 (L) 12.0 - 15.0 g/dL   HCT 24.7 (L) 36.0 - 46.0 %   MCV 99.6 80.0 - 100.0 fL   MCH 32.3 26.0 - 34.0 pg   MCHC 32.4 30.0 - 36.0 g/dL   RDW 13.7 11.5 - 15.5 %   Platelets 214 150 - 400 K/uL   nRBC 0.0 0.0 - 0.2 %   Recent Results (from the past 240 hour(s))  Blood Cultures (routine x 2)     Status: None   Collection Time: 05/05/21  1:00 PM   Specimen: BLOOD  Result Value Ref Range Status   Specimen Description BLOOD RIGHT ANTECUBITAL  Final   Special Requests    Final    BOTTLES DRAWN AEROBIC AND ANAEROBIC Blood Culture adequate volume   Culture   Final    NO GROWTH 5 DAYS Performed at [facility], [address], [place], [place] [address]    Report Status 05/10/2021 FINAL  Final  Blood Cultures (routine x 2)     Status: None   Collection Time: 05/05/21  1:10 PM   Specimen: BLOOD RIGHT HAND  Result Value Ref Range Status   Specimen Description BLOOD RIGHT HAND  Final   Special Requests   Final    BOTTLES DRAWN AEROBIC AND ANAEROBIC Blood Culture results may not be optimal due to an inadequate volume of blood received in culture bottles   Culture   Final    NO GROWTH 5 DAYS Performed at [facility], [place] [address], [place],  [address]    Report Status 05/10/2021 FINAL  Final  Resp Panel by RT-PCR (Flu A&B, Covid) Nasopharyngeal Swab     Status: Abnormal   Collection Time: 05/05/21  5:06 PM   Specimen: Nasopharyngeal Swab; Nasopharyngeal(NP) swabs in vial transport medium  Result Value Ref Range Status   SARS Coronavirus 2 by RT PCR POSITIVE (A) NEGATIVE Final    Comment: (NOTE) SARS-CoV-2 target nucleic acids are DETECTED.  The SARS-CoV-2 RNA is generally detectable in upper respiratory specimens during the acute phase of infection. Positive results are indicative of the presence of the identified virus, but do not rule out bacterial infection or co-infection with other pathogens not detected by the test. Clinical correlation with patient history and other diagnostic information is necessary to determine patient infection status. The expected result is Negative.  Fact Sheet for Patients: EntrepreneurPulse.com.au  Fact Sheet for Healthcare Providers: IncredibleEmployment.be  This test is not yet approved or cleared by the [place] FDA and  has been authorized for detection and/or diagnosis of SARS-CoV-2 by FDA under an Emergency Use Authorization (EUA).  This EUA  will remain in effect (meaning this test can be used) for the duration of  the COVID-19 declaration under Section 564(b)(1) of the A ct, 21 U.S.C. section 360bbb-3(b)(1), unless the authorization is terminated or revoked sooner.     Influenza A by PCR NEGATIVE NEGATIVE Final   Influenza B by PCR NEGATIVE NEGATIVE Final    Comment: (NOTE) The Xpert Xpress SARS-CoV-2/FLU/RSV plus assay is intended as an aid in the diagnosis of influenza from Nasopharyngeal swab specimens and should not be used as a sole basis for treatment. Nasal washings and aspirates are unacceptable for Xpert Xpress SARS-CoV-2/FLU/RSV testing.  Fact Sheet for Patients: EntrepreneurPulse.com.au  Fact Sheet for Healthcare Providers: IncredibleEmployment.be  This test is not yet approved or cleared by the [place] FDA and has been authorized for detection and/or  diagnosis of SARS-CoV-2 by FDA under an Emergency Use Authorization (EUA). This EUA will remain in effect (meaning this test can be used) for the duration of the COVID-19 declaration under Section 564(b)(1) of the Act, 21 U.S.C. section 360bbb-3(b)(1), unless the authorization is terminated or revoked.  Performed at [facility], [place] [address], [place], [place] [address]   MRSA Next Gen by PCR, Nasal     Status: None   Collection Time: 05/05/21  6:40 PM   Specimen: Nasal Mucosa; Nasal Swab  Result Value Ref Range Status   MRSA by PCR Next Gen NOT DETECTED NOT DETECTED Final    Comment: (NOTE) The GeneXpert MRSA Assay (FDA approved for NASAL specimens only), is one component of a comprehensive MRSA colonization surveillance program. It is not intended to diagnose MRSA infection nor to guide or monitor treatment for MRSA infections. Test performance is not FDA approved in patients less than 65 years old. Performed at [facility], [place] [address], [place], [place] [address]     Renal  Function: Recent Labs    05/05/21 1300 05/06/21 0504 05/07/21 0042 05/08/21 0122 05/09/21 0606 05/10/21 0140  CREATININE 1.39* 1.46* 1.13* 0.90 1.04* 1.40*   Estimated Creatinine Clearance: 27.8 mL/min (A) (by C-G formula based on SCr of 1.4 mg/dL (H)).  Radiologic Imaging: US RENAL  Result Date: 05/10/2021 CLINICAL DATA:  Dysuria EXAM: RENAL / URINARY TRACT ULTRASOUND COMPLETE COMPARISON:  None. FINDINGS: Right Kidney: Renal measurements: 9.1 x 4.5 x 4.8 cm = volume: 103 mL. Lobular cortical contour. Increased cortical echogenicity. No mass or hydronephrosis visualized. Simple cyst. Left Kidney: Renal measurements: 9.4 x 4.7 x 4.6 cm = volume: 105 mL. Lobular cortical contour. Increased cortical echogenicity. No mass or hydronephrosis visualized. Simple cysts. Bladder: Decompressed by Foley catheter. Other: None. IMPRESSION: 1. Increased cortical echogenicity bilaterally, as can be seen in medical renal disease. 2. No specific findings to explain hematuria. No calculi or hydronephrosis. Electronically Signed   By: Delanna Ahmadi M.D.   On: 05/10/2021 11:34    Procedure:  Foley hand irrigated with approx 300cc sterile saline.  No clots returned and irrigant light pink. Pt tolerated well.   Impression/Recommendation:  Gross hematuria--likely related to foley irritation in setting of anticoagulation. Pt has had microscopic hematuria on UAs since at least 2008 and she has been on anticoagulation since 2005. UA and urine culture ordered for today but have not been sent yet. Await review of culture.  Prophy Rocephin ordered by IM to start today to cover possible UTI.  Renal U/S negative and CT A/P pending.  Hand irrigate foley prn and plan to remove foley tomorrow for TOV as pt is mobile and was able to void without difficulty prior to admission. If gross hematuria persists pt may need cysto once she is stable from all other current medical issues.   Debbrah Alar 05/10/2021, 2:12 PM

## 2021-05-10 NOTE — Progress Notes (Signed)
Repeat CT head stable on heparing.  CT abd/pelvis did not show significant renal calculi or other abnormalities likely to explain hematuria.  Julian Hy, DO 05/10/21 6:44 PM [facility]

## 2021-05-10 NOTE — Progress Notes (Addendum)
Advanced Heart Failure Rounding Note  PCP-Cardiologist: Glori Bickers, MD   Subjective:    S/p MMA embolization on 01/27  On heparin gtt. Heparin level low this am, increased.   Neurosurgery planning to recheck head CT on [facility].  Hematuria noted this am.  Hgb trending down since admit, 10.6 > 9.4 > 8.5 > 7.8  Sitting up in bed. No complaints this am. Denies dyspnea.    Objective:   Weight Range: 61.4 kg Body mass index is 23.98 kg/m.   Vital Signs:   Temp:  [98 F (36.7 C)-98.7 F (37.1 C)] 98 F (36.7 C) (01/30 0750) Pulse Rate:  [65-203] 69 (01/30 0800) Resp:  [15-24] 18 (01/30 0800) BP: (95-144)/(45-80) 119/67 (01/30 0800) SpO2:  [91 %-100 %] 96 % (01/30 0800) Arterial Line BP: (112-144)/(46-78) 137/59 (01/30 0800) Last BM Date: 05/08/21  Weight change: Filed Weights   05/05/21 1241 05/07/21 0600  Weight: 67.9 kg 61.4 kg    Intake/Output:   Intake/Output Summary (Last 24 hours) at 05/10/2021 0930 Last data filed at 05/10/2021 0701 Gross per 24 hour  Intake 368.11 ml  Output 150 ml  Net 218.11 ml      Physical Exam    General:  Elderly, thin AAF. Appears weak.  HEENT: HOH Neck: supple. no JVD. Carotids 2+ bilat; no bruits. No lymphadenopathy or thryomegaly appreciated. Cor: PMI nondisplaced. Regular rate & rhythm. S1, mechanical S2. No rubs, gallops or murmurs. Lungs: clear Abdomen: soft, nontender, nondistended. No hepatosplenomegaly. No bruits or masses. Good bowel sounds. Extremities: no cyanosis, clubbing, rash, edema Neuro: alert & orientedx3, cranial nerves grossly intact. moves all 4 extremities w/o difficulty. Affect flat   Telemetry   NSR 60s-70s, ~ 5 PVCs/min  Labs    CBC Recent Labs    05/09/21 0606 05/10/21 0140  WBC 9.2 10.2  HGB 8.0* 7.8*  HCT 24.8* 25.1*  MCV 100.4* 102.0*  PLT 222 [id number]   Basic Metabolic Panel Recent Labs    05/08/21 0122 05/09/21 0606 05/10/21 0140  NA 141 138 138  K 3.9 4.0 4.3  CL 107  105 105  CO2 26 26 26   GLUCOSE 107* 119* 159*  BUN 19 18 23   CREATININE 0.90 1.04* 1.40*  CALCIUM 8.0* 7.8* 8.1*  MG 2.2  --   --    Liver Function Tests No results for input(s): AST, ALT, ALKPHOS, BILITOT, PROT, ALBUMIN in the last 72 hours.  No results for input(s): LIPASE, AMYLASE in the last 72 hours. Cardiac Enzymes No results for input(s): CKTOTAL, CKMB, CKMBINDEX, TROPONINI in the last 72 hours.   BNP: BNP (last 3 results) Recent Labs    01/15/21 1121  BNP 614.5*    ProBNP (last 3 results) No results for input(s): PROBNP in the last 8760 hours.   D-Dimer No results for input(s): DDIMER in the last 72 hours. Hemoglobin A1C No results for input(s): HGBA1C in the last 72 hours.  Fasting Lipid Panel No results for input(s): CHOL, HDL, LDLCALC, TRIG, CHOLHDL, LDLDIRECT in the last 72 hours.  Thyroid Function Tests No results for input(s): TSH, T4TOTAL, T3FREE, THYROIDAB in the last 72 hours.  Invalid input(s): FREET3   Other results:   Imaging    No results found.   Medications:     Scheduled Medications:  atorvastatin  40 mg Oral Daily   carvedilol  12.5 mg Oral BID WC   Chlorhexidine Gluconate Cloth  6 each Topical Daily   feeding supplement (GLUCERNA SHAKE)  237 mL  Oral TID BM   hydrALAZINE  25 mg Oral TID   insulin aspart  0-15 Units Subcutaneous TID WC   isosorbide mononitrate  30 mg Oral Daily   pantoprazole  40 mg Oral Daily   sodium chloride flush  3 mL Intravenous Q12H    Infusions:  heparin 1,150 Units/hr (05/10/21 0800)    PRN Medications: acetaminophen, iohexol, ondansetron (ZOFRAN) IV, polyethylene glycol    Patient Profile   76 y.o. female with history of chronic systolic CHF, NICM with EF 20-25% s/p ICD, hx mechanical mitral valve, hx GI bleed. Admitted with SDH in setting of supratherapeutic INR.    Assessment/Plan   SDH: -B/l, appear larger on CT head today -S/p MMA embolization on 01/27 -In setting of  supratherapeutic INR. INR >10 on admit. Uncertain if taking coumadin as prescribed +/- malnutrition. Given Kcentra. Missed last INR appointment -? Falls at home -Now on heparin gtt. Planning to check head CT once therapeutic.   2. Chronic systolic CHF/NICM: - nonischemic cardiomyopathy, ejection fraction 25% s/p St Jude single chamber ICD in 2009  - EF 20-25% in 11/2014 - Echo 08/2016. EF 15% - Echo 06/2018 EF 20-25%  - Echo 07/2020 EF 20-25%, RWMA, normal functioning mechanical mitral valve.  Moderately elevated PASP - Echo 04/2021 EF < 20%, RV moderately reduced, mean gradient across mitral valve prosthesis 2.5 mmHg, unable to assess MR, moderate TR - Volume status stable on exam. Prescribed furosemide at home but not taking d/t urinary frequency. Does not need diuretic at this point.  - Continue coreg 12.5 mg BID - Continue imdur 30 mg daily and hydralazine 25 mg TID - Previously stopped entresto d/t worsening creatinine and hyperkalemia - Not on SGLT2i d/t UTIs - BP stable   3. Mitral valve disease s/p mechanical MVR in 2005: - On coumadin, compliance not certain as above.  - anticoagulation on hold in view of SDH and anticipation of middle meningeal artery embolization. Resume AC as soon as okay from bleeding perspective. Appreciate NSU input. - MV functioning normally on last echo in 04/22 - Repeat echo as above. Unable to assess for MR. Mean gradient 2.5 mmHg across MV prosthesis. EF < 20%   4. CKD IIIb: - Scr baseline around 1.3 - Scr 1.40 today   5. COVID positive: - Appears asymptomatic - O2 stable on RA   6. Hx Iron deficiency anemia: -upper GI bleed 04/22 in the setting of supratherapeutic INR and H. Pylori  -iron infusion in 06/22 -Hgb 10.4>9.4>8.5>7.8 -Follow closely with anticoagulation -Hematuria noted today. Discussed with CCM at bedside. Planning for renal US   GOC: Remains full code. Has been seen by palliative care. Planning to return to [place] with her son.    Worry about her long-term prognosis with significant weight loss and functional decline in recent months    Length of Stay: 5  FINCH, LINDSAY N, PA-C  05/10/2021, 9:30 AM  Advanced Heart Failure Team Pager [phone] (M-F; 7a - 5p)  Please contact [facility] for night-coverage after hours (5p -7a ) and weekends on [email or web address]   Patient seen and examined with the above-signed Advanced Practice Provider and/or Housestaff. I personally reviewed laboratory data, imaging studies and relevant notes. I independently examined the patient and formulated the important aspects of the plan. I have edited the note to reflect any of my changes or salient points. I have personally discussed the plan with the patient and/or family.  Clinically appears stable but hgb trending down on heparin. Pending repeat  head CT today.   HF status stable.   General:  Weak appearing. No resp difficulty HEENT: normal Neck: supple. no JVD. Carotids 2+ bilat; no bruits. No lymphadenopathy or thryomegaly appreciated. Cor: PMI nondisplaced. Regular rate & rhythm. Mechanical s1 Lungs: clear Abdomen: soft, nontender, nondistended. No hepatosplenomegaly. No bruits or masses. Good bowel sounds. Extremities: no cyanosis, clubbing, rash, edema Neuro: alert & orientedx3, cranial nerves grossly intact. moves all 4 extremities w/o difficulty. Affect pleasant  Remains stable from a HF perspective. Appears neurologically stable. On heparin but hgb dropping. Wait repeat head CT to reassess SDH -> agree with plan. If she needs acute surgical drainage would likely be able to tolerate from cardiac perspective if absolutely needed.   Overall tenuous.   Family planning to take her home to [place] after she gets out of the hospital   Glori Bickers, MD  11:43 AM

## 2021-05-10 NOTE — Progress Notes (Signed)
Subjective: Patient reports\ she's doing ok, no headache, no NTW  Objective: Vital signs in last 24 hours: Temp:  [98.1 F (36.7 C)-98.7 F (37.1 C)] 98.4 F (36.9 C) (01/30 0300) Pulse Rate:  [63-203] 102 (01/30 0500) Resp:  [15-24] 19 (01/30 0500) BP: (95-144)/(45-87) 116/80 (01/30 0500) SpO2:  [91 %-100 %] 100 % (01/30 0500) Arterial Line BP: (106-144)/(46-78) 144/78 (01/30 0500)  Intake/Output from previous day: 01/29 0701 - 01/30 0700 In: 454.9 [P.O.:220; I.V.:234.9] Out: 240 [Urine:240] Intake/Output this shift: No intake/output data recorded.  Awake and alert, MAEx4, FC  Lab Results: Lab Results  Component Value Date   WBC 10.2 05/10/2021   HGB 7.8 (L) 05/10/2021   HCT 25.1 (L) 05/10/2021   MCV 102.0 (H) 05/10/2021   PLT 207 05/10/2021   Lab Results  Component Value Date   INR 1.3 (H) 05/10/2021   BMET Lab Results  Component Value Date   NA 138 05/10/2021   K 4.3 05/10/2021   CL 105 05/10/2021   CO2 26 05/10/2021   GLUCOSE 159 (H) 05/10/2021   BUN 23 05/10/2021   CREATININE 1.40 (H) 05/10/2021   CALCIUM 8.1 (L) 05/10/2021    Studies/Results: CT HEAD WO CONTRAST ( )  Result Date: 05/08/2021 CLINICAL DATA:  Stroke follow-up. Middle meningeal artery embolization EXAM: CT HEAD WITHOUT CONTRAST TECHNIQUE: Contiguous axial images were obtained from the base of the skull through the vertex without intravenous contrast. RADIATION DOSE REDUCTION: This exam was performed according to the departmental dose-optimization program which includes automated exposure control, adjustment of the mA and/or kV according to patient size and/or use of iterative reconstruction technique. COMPARISON:  Head CT from yesterday FINDINGS: Brain: Bilateral mixed density subdural hematoma, confluent along the lateral cerebral convexities and on the left also present in the inter hemispheric fissure. No progressive bleeding. The collections continue to measure up to 14 mm in thickness on  the right and 15 mm in thickness on the left. Bilateral cortical mass effect. No hydrocephalus or visible infarct Vascular: Linear high-density along the bilateral middle meningeal artery grooves. On the left there is extension towards the facial artery. Skull: Normal. Negative for fracture or focal lesion. Sinuses/Orbits: No acute finding. IMPRESSION: Unchanged mixed density subdural hematoma along the bilateral cerebral convexities with cortical mass effect. Electronically Signed   By: Tiburcio Pea M.D.   On: 05/08/2021 12:04    Assessment/Plan: Overall stable after MMA embolization, on hep gtt, await new HCT. Difficult situation with risk either way, but we feel risk of valve complications off anticoag is higher than risk from stable B SDH. Hopefully the acute components will liquify and resolve without further intervention. Difficult to know.  Estimated body mass index is 23.98 kg/m as calculated from the following:   Height as of this encounter: 5\' 3"  (1.6 m).   Weight as of this encounter: 61.4 kg.    LOS: 5 days    05/10/2021, 7:32 AM

## 2021-05-10 NOTE — Progress Notes (Addendum)
ANTICOAGULATION CONSULT NOTE   Pharmacy Consult for Heparin IV Indication:  Mechanical valve  Allergies  Allergen Reactions   Strawberry Extract Rash    Rash & itching.    Patient Measurements: Height: 5\' 3"  (160 cm) Weight: 61.4 kg (135 lb 5.8 oz) IBW/kg (Calculated) : 52.4 Heparin Dosing Weight: 61.4 kg  Vital Signs: Temp: 98 F (36.7 C) (01/30 0750) Temp Source: Oral (01/30 0750) BP: 92/53 (01/30 1100) Pulse Rate: 79 (01/30 1100)  Labs: Recent Labs    05/08/21 0122 05/08/21 2200 05/09/21 0606 05/09/21 0607 05/09/21 1502 05/10/21 0140 05/10/21 1214  HGB 8.5*  --  8.0*  --   --  7.8*  --   HCT 26.2*  --  24.8*  --   --  25.1*  --   PLT 237  --  222  --   --  207  --   LABPROT 15.5*  --  17.0*  --   --  16.4*  --   INR 1.2  --  1.4*  --   --  1.3*  --   HEPARINUNFRC  --    < >  --    < > <0.10* <0.10* 0.38  CREATININE 0.90  --  1.04*  --   --  1.40*  --    < > = values in this interval not displayed.     Estimated Creatinine Clearance: 27.8 mL/min (A) (by C-G formula based on SCr of 1.4 mg/dL (H)).   Medical History: Past Medical History:  Diagnosis Date   Acute gastric ulcer without hemorrhage or perforation    AICD (automatic cardioverter/defibrillator) present    Aortic stenosis, mild    mean gradient 37mmHg echo 09/09   CHF (congestive heart failure) (HCC)    --Non-ischemic CM EF 30-35% by echo 9/09 (Previous 25%)  --s/p St. Jude single chamber ICD 04/09 --Minimal  CAD by cath 2004 LAD 20% LCX 20% Ramus 30% RCA nl --echo 05/11 EF 20-25%   Diabetes mellitus    type 2   Hyperlipidemia    Hypertension    Kidney stones    MR (congenital mitral regurgitation)    Severe MR s/p St. Jude mechanical MVR by Gerrit Friends 2005    Medications:  Scheduled:   atorvastatin  40 mg Oral Daily   carvedilol  12.5 mg Oral BID WC   Chlorhexidine Gluconate Cloth  6 each Topical Daily   feeding supplement (GLUCERNA SHAKE)  237 mL Oral TID BM   hydrALAZINE  25 mg Oral  TID   insulin aspart  0-15 Units Subcutaneous TID WC   isosorbide mononitrate  30 mg Oral Daily   pantoprazole  40 mg Oral Daily   sodium chloride flush  3 mL Intravenous Q12H   Infusions:   cefTRIAXone (ROCEPHIN)  IV 1 g (05/10/21 1210)   heparin 1,150 Units/hr (05/10/21 0800)  none  Assessment: 76 years of age female with history significant for congenital MR with St Judes mechanical valve on chronic warfarin who was admitted for AMS and weakness found to have INR >10 and bilateral SDH who received reversal of anticoagulation with Kcentra and Vitamin K IV on 1/25 and is now status post Onxy embolization of bilateral middle meningeal arteries on 1/27. Repeat CT Head is stable. Ok per Neurosurgery to resume anticoagulation with IV Heparin without boluses.   Plan per Neurosurgery - once patient is therapeutic on IV Heparin plan to repeat CT Head and if remains stable, then resume Warfarin.  Heparin level  of 0.38 is therapeutic on heparin 1150 units/hr. Level drawn appropriately. Hgb 7.8. Plt 207. Now with significant hematuria likely developed overnight per RNs report - urology consulted.    Goal of Therapy:  Heparin level 0.3 to 0.5 units/ml Monitor platelets by anticoagulation protocol: Yes   Plan:  Decrease heparin slightly to 1100 units/hr due to hematuria Discussed with Dr. Carlis Abbott and will follow up CBC at 1400 and hematuria for need to hold heparin  Check 8 hr heparin level  Monitor heparin level, CBC and s/s of bleeding daily   Cristela Felt, PharmD, BCPS Clinical Pharmacist 05/10/2021 1:19 PM   Addendum: CBC resulted - hgb 8.0, hct 24.7, plt 214. Discussed results with Dr. Carlis Abbott. Okay to continue heparin.   Cristela Felt, PharmD, BCPS Clinical Pharmacist 05/10/2021 2:02 PM

## 2021-05-10 NOTE — Progress Notes (Signed)
ANTICOAGULATION CONSULT NOTE   Pharmacy Consult for Heparin IV Indication:  Mechanical valve  Allergies  Allergen Reactions   Strawberry Extract Rash    Rash & itching.    Patient Measurements: Height: 5\' 3"  (160 cm) Weight: 61.4 kg (135 lb 5.8 oz) IBW/kg (Calculated) : 52.4 Heparin Dosing Weight: 61.4 kg  Vital Signs: Temp: 98.7 F (37.1 C) (01/29 1900) Temp Source: Oral (01/29 1900) BP: 115/57 (01/30 0000) Pulse Rate: 66 (01/30 0000)  Labs: Recent Labs    05/08/21 0122 05/08/21 2200 05/09/21 0606 05/09/21 0607 05/09/21 1502 05/10/21 0140  HGB 8.5*  --  8.0*  --   --  7.8*  HCT 26.2*  --  24.8*  --   --  25.1*  PLT 237  --  222  --   --  207  LABPROT 15.5*  --  17.0*  --   --   --   INR 1.2  --  1.4*  --   --   --   HEPARINUNFRC  --    < >  --  <0.10* <0.10* <0.10*  CREATININE 0.90  --  1.04*  --   --  1.40*   < > = values in this interval not displayed.     Estimated Creatinine Clearance: 27.8 mL/min (A) (by C-G formula based on SCr of 1.4 mg/dL (H)).   Medical History: Past Medical History:  Diagnosis Date   Acute gastric ulcer without hemorrhage or perforation    AICD (automatic cardioverter/defibrillator) present    Aortic stenosis, mild    mean gradient 62mmHg echo 09/09   CHF (congestive heart failure) (HCC)    --Non-ischemic CM EF 30-35% by echo 9/09 (Previous 25%)  --s/p St. Jude single chamber ICD 04/09 --Minimal  CAD by cath 2004 LAD 20% LCX 20% Ramus 30% RCA nl --echo 05/11 EF 20-25%   Diabetes mellitus    type 2   Hyperlipidemia    Hypertension    Kidney stones    MR (congenital mitral regurgitation)    Severe MR s/p St. Jude mechanical MVR by Gerrit Friends 2005    Medications:  Scheduled:   atorvastatin  40 mg Oral Daily   carvedilol  12.5 mg Oral BID WC   Chlorhexidine Gluconate Cloth  6 each Topical Daily   feeding supplement (GLUCERNA SHAKE)  237 mL Oral TID BM   hydrALAZINE  25 mg Oral TID   insulin aspart  0-15 Units Subcutaneous  TID WC   isosorbide mononitrate  30 mg Oral Daily   pantoprazole  40 mg Oral Daily   sodium chloride flush  3 mL Intravenous Q12H   Infusions:   heparin 1,000 Units/hr (05/09/21 2107)  none  Assessment: 76 years of age female with history significant for congenital MR with St Judes mechanical valve on chronic warfarin who was admitted for AMS and weakness found to have INR >10 and bilateral SDH who received reversal of anticoagulation with Kcentra and Vitamin K IV on 1/25 and is now status post Onxy embolization of bilateral middle meningeal arteries on 1/27.   Repeat CT Head this AM is stable. Ok per Neurosurgery to resume anticoagulation with IV Heparin without boluses. Pharmacy consulted to start IV Heparin (no bolus). Hgb down to 8.5 today. Platelets stable. INR currently at 1.2. No overt signs of bleeding. Patient reported mild headache this AM.   Plan per Neurosurgery - once patient is therapeutic on IV Heparin plan to repeat CT Head and if remains stable, then resume  Warfarin.  PM f/u > heparin level still undetectable on 850 units/hr.  No overt bleeding or complications noted.  1/30 AM update:  Heparin level remains low No issues per RN  Goal of Therapy:  Heparin level 0.3 to 0.5 units/ml Monitor platelets by anticoagulation protocol: Yes   Plan:  Inc heparin to 1150 units/hr 8 hour heparin level  Narda Bonds, PharmD, BCPS Clinical Pharmacist Phone: [phone]

## 2021-05-11 ENCOUNTER — Inpatient Hospital Stay (HOSPITAL_COMMUNITY): Payer: Medicare Other

## 2021-05-11 DIAGNOSIS — Z952 Presence of prosthetic heart valve: Secondary | ICD-10-CM | POA: Diagnosis not present

## 2021-05-11 DIAGNOSIS — I5022 Chronic systolic (congestive) heart failure: Secondary | ICD-10-CM | POA: Diagnosis not present

## 2021-05-11 DIAGNOSIS — S065XAA Traumatic subdural hemorrhage with loss of consciousness status unknown, initial encounter: Secondary | ICD-10-CM | POA: Diagnosis not present

## 2021-05-11 DIAGNOSIS — Z7901 Long term (current) use of anticoagulants: Secondary | ICD-10-CM | POA: Diagnosis not present

## 2021-05-11 LAB — BASIC METABOLIC PANEL
Anion gap: 8 (ref 5–15)
BUN: 17 mg/dL (ref 8–23)
CO2: 26 mmol/L (ref 22–32)
Calcium: 8.5 mg/dL — ABNORMAL LOW (ref 8.9–10.3)
Chloride: 102 mmol/L (ref 98–111)
Creatinine, Ser: 0.92 mg/dL (ref 0.44–1.00)
GFR, Estimated: >60 mL/min (ref >60)
Glucose, Bld: 155 mg/dL — ABNORMAL HIGH (ref 70–99)
Potassium: 3.9 mmol/L (ref 3.5–5.1)
Sodium: 136 mmol/L (ref 135–145)

## 2021-05-11 LAB — HEPARIN LEVEL (UNFRACTIONATED)
Heparin Unfractionated: 0.45 IU/mL (ref 0.30–0.70)
Heparin Unfractionated: 0.18 IU/mL — ABNORMAL LOW (ref 0.30–0.70)

## 2021-05-11 LAB — CBC
HCT: 26.4 % — ABNORMAL LOW (ref 36.0–46.0)
Hemoglobin: 8.6 g/dL — ABNORMAL LOW (ref 12.0–15.0)
MCH: 32.2 pg (ref 26.0–34.0)
MCHC: 32.6 g/dL (ref 30.0–36.0)
MCV: 98.9 fL (ref 80.0–100.0)
Platelets: 258 10*3/uL (ref 150–400)
RBC: 2.67 MIL/uL — ABNORMAL LOW (ref 3.87–5.11)
RDW: 13.9 % (ref 11.5–15.5)
WBC: 11.1 10*3/uL — ABNORMAL HIGH (ref 4.0–10.5)
nRBC: 0 % (ref 0.0–0.2)

## 2021-05-11 LAB — GLUCOSE, CAPILLARY
Glucose-Capillary: 100 mg/dL — ABNORMAL HIGH (ref 70–99)
Glucose-Capillary: 105 mg/dL — ABNORMAL HIGH (ref 70–99)
Glucose-Capillary: 161 mg/dL — ABNORMAL HIGH (ref 70–99)
Glucose-Capillary: 89 mg/dL (ref 70–99)
Glucose-Capillary: 99 mg/dL (ref 70–99)

## 2021-05-11 LAB — PROTIME-INR
INR: 1.2 (ref 0.8–1.2)
Prothrombin Time: 15.3 seconds — ABNORMAL HIGH (ref 11.4–15.2)

## 2021-05-11 MED ORDER — HEPARIN (PORCINE) 25000 UT/250ML-% IV SOLN
1150.0000 [IU]/h | INTRAVENOUS | Status: DC
  Administered 2021-05-11: 15:00:00 1000 [IU]/h via INTRAVENOUS
  Administered 2021-05-12: 07:00:00 1050 [IU]/h via INTRAVENOUS
  Administered 2021-05-13 – 2021-05-14 (×2): 1200 [IU]/h via INTRAVENOUS
  Administered 2021-05-15: 06:00:00 1150 [IU]/h via INTRAVENOUS
  Administered 2021-05-16: 09:00:00 1100 [IU]/h via INTRAVENOUS
  Filled 2021-05-11 (×6): qty 250

## 2021-05-11 MED ORDER — PROTAMINE SULFATE 10 MG/ML IV SOLN
20.0000 mg | Freq: Once | INTRAVENOUS | Status: AC
  Administered 2021-05-11: 20 mg via INTRAVENOUS
  Filled 2021-05-11: qty 2

## 2021-05-11 MED ORDER — MAGNESIUM SULFATE IN D5W 1-5 GM/100ML-% IV SOLN
1.0000 g | Freq: Once | INTRAVENOUS | Status: AC
  Administered 2021-05-11: 1 g via INTRAVENOUS
  Filled 2021-05-11: qty 100

## 2021-05-11 MED ORDER — LEVETIRACETAM IN NACL 500 MG/100ML IV SOLN
500.0000 mg | Freq: Two times a day (BID) | INTRAVENOUS | Status: DC
  Administered 2021-05-11 (×2): 500 mg via INTRAVENOUS
  Filled 2021-05-11 (×2): qty 100

## 2021-05-11 NOTE — TOC Progression Note (Addendum)
Transition of Care [facility]) - Progression Note    Patient Details  Name: Deborah [name] MRN: [id number] Date of Birth: 12/14/1945  Transition of Care [facility]) CM/SW Contact  Sharai Overbay, LCSW Phone Number: 05/11/2021, 10:19 AM  Clinical Narrative:    HF CSW received consult for possible SNF placement at time of discharge. CSW spoke with patient and son, [name]. Patient reported that patient's son is currently unable to care for patient at their home given patients current physical needs and fall risk. Patient expressed understanding of PT recommendation and is agreeable to SNF placement at time of discharge. Patient reports preference for [place], [place]. CSW discussed insurance authorization process and will provide Medicare SNF ratings list. Patient has received the COVID vaccines. CSW will send out referrals for review. Patient expressed being hopeful for rehab and to feel better soon. No further questions reported at this time.   Skilled Nursing Rehab Facilities-   [email or web address]   Ratings out of 5 possible   Name Address  Phone # Quality Care Staffing Health Inspection Overall  [facility] [address], [place] [phone] 5 5 2 4   [facility]  [address], [place] [phone] 4 2 5 5   [facility] [address] [place], [address] [place] 4 1 1 1   [facility] [address] 2 2 4 4   [facility] [address], [phone] 2 1 2 1   [facility] [phone] [address] [phone] 3 1 4 3   [facility] [address], [address] [place] 5 2 2 3   [facility] [address], [facility] [place] 4 1 2 1   [address] ([place]) [address] [facility] 5 1 2 2   [facility] [phone], [phone] 4 1 1 1   [facility] [address] [facility] [phone] 4 1 2 1    [facility] ([place]) [address], [place] [phone] 4 1 1 1           [facility] [address] [facility]      [facility] [address] [facility] [place] 4 2 3 3   [facility] [address], [place] [phone] 3 1 5 4   [facility] [place] [address] [facility] [address], [place] [phone] 2 1 1 1   [facility] [address] [place] [phone] 2 2 3 3           [address] (no [facility]) [address], [place] [phone] 4 5 5 5   [facility] (No Hum[employer]) [address] [place] 4 1 4 3   [facility] (No UHC) [address] [place], [place] [phone] 5 5 5 5   [facility] [address] [place] [phone] 3 3 4 4   [facility] [address], [address]  [phone] 2 2 2 2   [facility] [address] [place] 3 1 3 2   [facility] [address] [place] [phone] 1 1 2 1   [facility] [address], [place] 2 1 1 1   [facility] [address] [facility] [place] 5 2 4 5   [facility] [address] [place] [phone] 2 1 1 1   [facility] [address], [phone] 3 1 1 1   [facility] [address] [place], [phone] 2 1 2 1           [facility] [address], [place] [phone]  5 3 3 4   [facility] [place] [address], [place] [phone] 2 1 1 1   [facility] (No Humana) [address], [place] 2 1 2 1   [facility] [address], [phone] 3 1 1 1           [facility] [address] [place], [facility] 4 1 5 4   [facility]) [place] [address] [phone] 2 1 3 2   [facility]) [address] [place], [address] [place] 3 1 4 3   [facility] [address], [facility] [place] 4 1 4 3   [address] [place], [facility] [place] 3 3 1 1   [facility]) [address] [place] [phone] 3 2 3 3     2:21pm - HF CSW left a message with Mr. Sethberg about the bed offers that the CSW brought by to the room and to call when he and his mother have chosen a facility so the CSW can start the insurance authorization.  CSW will continue to follow throughout discharge.  Expected Discharge Plan: Skilled Nursing Facility Barriers to Discharge: Continued Medical Work up  Expected Discharge Plan and Services Expected Discharge Plan: Skilled Nursing Facility In-house Referral: Clinical Social Work Discharge Planning Services: CM Consult Post Acute Care Choice: Skilled Nursing Facility Living arrangements for the past 2 months: Apartment                                       Social Determinants of Health (SDOH) Interventions    Readmission Risk Interventions No flowsheet data found.  Khori Rosevear, MSW, LCSWA [phone] Heart Failure Social Worker

## 2021-05-11 NOTE — Progress Notes (Signed)
° °  Palliative Medicine Inpatient Follow Up Note   Chart reviewed.  Secure chat sent to Dr. Chestine Spore to see if any additional Palliative involvement is needed at this time. Dr. Chestine Spore shared presently, no support is needed.   Palliative care will continue to shadow [name]'s chart in the oncoming days.   Please contact the PMT if additional support is needed at any time.  ______________________________________________________________________________________ Deborah [name] [place] Palliative Medicine Team Team Cell Phone: [phone] Please utilize secure chat with additional questions, if there is no response within 30 minutes please call the above phone number  Palliative Medicine Team providers are available by phone from 7am to 7pm daily and can be reached through the team cell phone.  Should this patient require assistance outside of these hours, please call the patient's attending physician.

## 2021-05-11 NOTE — Progress Notes (Signed)
SLP Cancellation Note  Patient Details Name: Deborah [name] MRN: [id number] DOB: 1945/12/06   Cancelled treatment:       Reason Eval/Treat Not Completed: Other (comment) (Physician in room with patient and reporting to nursing that patient appears more lethargic and not following commands. Repeat head CT was ordered. SLP will attempt to see patient later this date or next date schedule permitting.)  Angela Nevin, MA, CCC-SLP Speech Therapy

## 2021-05-11 NOTE — Progress Notes (Signed)
OT Cancellation Note  Patient Details Name: KATHELEEN STELLA MRN: [id number] DOB: 02/25/1946   Cancelled Treatment:    Reason Eval/Treat Not Completed: Medical issues which prohibited therapy Coordinated with RN after pt's repeat head CT. Per RN, MD reports worsening brain bleed and recommends holding any therapy attempts today. Will follow-up at a later date.  Lorre Munroe 05/11/2021, 12:15 PM

## 2021-05-11 NOTE — Progress Notes (Signed)
Subjective: Patient reports no headache  Objective: Vital signs in last 24 hours: Temp:  [97.7 F (36.5 C)-99.2 F (37.3 C)] 98.3 F (36.8 C) (01/31 0737) Pulse Rate:  [63-111] 78 (01/31 0743) Resp:  [10-31] 10 (01/31 0600) BP: (92-150)/(49-106) 135/80 (01/31 0743) SpO2:  [91 %-100 %] 97 % (01/31 0600) Arterial Line BP: (100-161)/(39-84) 148/73 (01/31 0600)  Intake/Output from previous day: 01/30 0701 - 01/31 0700 In: 878.7 [P.O.:420; I.V.:258.7; IV Piggyback:200] Out: 1300 [Urine:1300] Intake/Output this shift: Total I/O In: -  Out: 100 [Urine:100]\  Easily arousable, moves all ext, FC, some disorientation, overall stable  Lab Results: Lab Results  Component Value Date   WBC 11.1 (H) 05/11/2021   HGB 8.6 (L) 05/11/2021   HCT 26.4 (L) 05/11/2021   MCV 98.9 05/11/2021   PLT 258 05/11/2021   Lab Results  Component Value Date   INR 1.2 05/11/2021   BMET Lab Results  Component Value Date   NA 136 05/11/2021   K 3.9 05/11/2021   CL 102 05/11/2021   CO2 26 05/11/2021   GLUCOSE 155 (H) 05/11/2021   BUN 17 05/11/2021   CREATININE 0.92 05/11/2021   CALCIUM 8.5 (L) 05/11/2021    Studies/Results: CT HEAD WO CONTRAST (5MM)  Result Date: 05/10/2021 CLINICAL DATA:  Stroke, follow up [place], s/p MMA embolization. follow up scan after starting heparin EXAM: CT HEAD WITHOUT CONTRAST TECHNIQUE: Contiguous axial images were obtained from the base of the skull through the vertex without intravenous contrast. RADIATION DOSE REDUCTION: This exam was performed according to the departmental dose-optimization program which includes automated exposure control, adjustment of the mA and/or kV according to patient size and/or use of iterative reconstruction technique. COMPARISON:  CT 05/08/2021. FINDINGS: Brain: Similar size of mixed density subdural hematomas along bilateral cerebral convexities, measuring up to approximately 1.5 cm on the left and 1.4 cm on the right. Subdural hemorrhage  layers along the falx and tentorial leaflets, similar. There is similar mass effect with partial effacement of the lateral ventricles and cortical mass effect. No progressive mass effect or evidence of new/interval acute hemorrhage. No evidence of acute large vascular territory infarct, hydrocephalus, or mass lesion. Vascular: Redemonstrated sequela of middle meningeal artery embolization, including linear hyperdensity along bilateral middle meningeal grooves and extending anteriorly on the left toward the facial artery. Skull: No acute fracture. Sinuses/Orbits: Paranasal sinus mucosal thickening with air-fluid levels in the sphenoid sinuses. Other: No mastoid effusions. IMPRESSION: Unchanged mixed density subdural hematomas along bilateral cerebral convexities with similar mass effect. Electronically Signed   By: Margaretha Sheffield M.D.   On: 05/10/2021 17:12   CT ABDOMEN PELVIS W CONTRAST  Result Date: 05/10/2021 CLINICAL DATA:  Hematuria. EXAM: CT ABDOMEN AND PELVIS WITH CONTRAST TECHNIQUE: Multidetector CT imaging of the abdomen and pelvis was performed using the standard protocol following bolus administration of intravenous contrast. RADIATION DOSE REDUCTION: This exam was performed according to the departmental dose-optimization program which includes automated exposure control, adjustment of the mA and/or kV according to patient size and/or use of iterative reconstruction technique. CONTRAST:  5mL OMNIPAQUE IOHEXOL 300 MG/ML  SOLN COMPARISON:  Renal ultrasound 05/10/2021. CT abdomen and pelvis 09/03/2010. FINDINGS: Lower chest: The heart is enlarged. Pacemaker lead is partially visualized. Sternotomy wires are present. There is a trace right pleural effusion. Hepatobiliary: There are rounded hypodensities in the liver which are too small to characterize, likely small cysts. The liver otherwise appears within normal limits. The gallbladder is contracted. No definite biliary ductal dilatation. Pancreas:  Unremarkable.  No pancreatic ductal dilatation or surrounding inflammatory changes. Spleen: Normal in size without focal abnormality. Adrenals/Urinary Tract: Adrenal glands are unremarkable. There is no hydronephrosis. There is normal renal enhancement and excretion bilaterally. There are hypodensities in both kidneys which are too small to characterize, most likely cysts. There is a 2 cm cyst in the posterior right kidney there is a single punctate nonobstructing calculus in the superior pole the left kidney. The bladder is decompressed by Foley catheter. Can not exclude diffuse bladder wall thickening. Stomach/Bowel: There is rectal wall thickening with surrounding inflammation and presacral edema. There is no evidence for bowel obstruction or free air. There is colonic diverticulosis without evidence for acute diverticulitis. Appendix is not seen. Stomach and small bowel are within normal limits. Vascular/Lymphatic: Aortic atherosclerosis. No enlarged abdominal or pelvic lymph nodes. Reproductive: Status post hysterectomy. No adnexal masses. Other: Mild body wall edema. No focal abdominal wall hernia. No ascites. Musculoskeletal: The bones are osteopenic. Multilevel degenerative changes affect the spine. IMPRESSION: 1. Rectal wall thickening with surrounding inflammation and presacral edema concerning for proctitis. Recommend clinical correlation and follow-up to exclude underlying lesion. 2. Foley catheter decompresses the urinary bladder. Can not exclude diffuse bladder wall thickening. 3. Bilateral renal cysts and hypodensities which are too small to characterize. 4. Left renal calculus. 5. Colonic diverticulosis. 6. Trace right pleural effusion. 7.  Aortic Atherosclerosis (ICD10-I70.0). Electronically Signed   By: Ronney Asters M.D.   On: 05/10/2021 17:13   US RENAL  Result Date: 05/10/2021 CLINICAL DATA:  Dysuria EXAM: RENAL / URINARY TRACT ULTRASOUND COMPLETE COMPARISON:  None. FINDINGS: Right Kidney:  Renal measurements: 9.1 x 4.5 x 4.8 cm = volume: 103 mL. Lobular cortical contour. Increased cortical echogenicity. No mass or hydronephrosis visualized. Simple cyst. Left Kidney: Renal measurements: 9.4 x 4.7 x 4.6 cm = volume: 105 mL. Lobular cortical contour. Increased cortical echogenicity. No mass or hydronephrosis visualized. Simple cysts. Bladder: Decompressed by Foley catheter. Other: None. IMPRESSION: 1. Increased cortical echogenicity bilaterally, as can be seen in medical renal disease. 2. No specific findings to explain hematuria. No calculi or hydronephrosis. Electronically Signed   By: Delanna Ahmadi M.D.   On: 05/10/2021 11:34    Assessment/Plan: Clinically stable, CTH stable, CCM  Estimated body mass index is 23.98 kg/m as calculated from the following:   Height as of this encounter: 5\' 3"  (1.6 m).   Weight as of this encounter: 61.4 kg.    LOS: 6 days    Eustace Moore 05/11/2021, 8:15 AM

## 2021-05-11 NOTE — Progress Notes (Signed)
NAME:  Deborah [name], MRN:  [id number], DOB:  1946-03-25, LOS: 6 ADMISSION DATE:  05/05/2021, CONSULTATION DATE: 05/05/21 REFERRING MD: Dr. Karle Starch, CHIEF COMPLAINT: AMS  History of Present Illness:  76 year old female with prior history significant for HFrEF (4/22 EF 20-25%), AICD, HTN, congential MR s/p MVR [facility]. Judes on coumadin, HLD, DM, and anemia who presented to [facility] ER by EMS with altered mental status and weakness.   Patient lives alone.  Her sister was unable to reach her yesterday or today.  They went to check on her and she was found her on the bathroom floor.  EMS report notes that she stated she had lost control of her bladder and sat on the floor to clean her self but was too weak to get up.  Denied falling, dizziness, syncope, etc.  Also reports not eating/ drinking for 16 hours.  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 Judes on coumadin, HLD, DM, anemia, prior GI bleed   Significant Hospital Events: Including procedures, antibiotic start and stop dates in addition to other pertinent events   1/25 Admitted, NSGY consulted, reversed with Eppie Gibson 1/25 CT head; sizable acute hemispheric subdural hematoma with mass-effect on right frontal lobe 1/26 CT head; progressive bilateral subdural hematoma with increased mass-effect on frontal lobe. 1/27 MMA embolization with nsgy 1/28 resumed heparin no bolus 1/30 renal US; no hydronephrosis or stones 1/30 CT head; stable bilateral subdural hematomas 1/30 CT AP; diverticulosis, possible proctitis  Interim History / Subjective:  Mild agitation overnight requiring soft restraints and as needed sedation Sleepy but arousable this morning Mild confusion and disorientation  Objective   Blood pressure (!) 147/85, pulse 83, temperature 99.2 F (37.3 C), temperature source Axillary, resp. rate 10, height 5\' 3"  (1.6 m), weight 61.4 kg, SpO2 97 %.         Intake/Output Summary (Last 24 hours) at 05/11/2021 0718 Last data filed at 05/11/2021 0600 Gross per 24 hour  Intake 867.13 ml  Output 1300 ml  Net -432.87 ml   Filed Weights   05/05/21 1241 05/07/21 0600  Weight: 67.9 kg 61.4 kg    Examination: General: Well-appearing, frail elderly woman.  NAD HENT: [place]/AT. Moist mucous membranes Lungs: CTAB.  No wheezing or rales. Cardiovascular: RRR.  Mitral click murmur.  No rubs or gallops. Abdomen: Soft. NT/ND. Normal BS Extremities: Well perfused.  No edema. Neuro: Somnolent but arousable.  Oriented to self and place.  Moves all extremities. GU: Foley in place with tea-colored urine  Labs reviewed: CBGs 180, 176, 161 PT/INR 15.3/1.2, WBC 11.1, Hgb 8.6 Creatinine 0.92, K+ 3.9  Resolved Hospital Problem list    Assessment & Plan:  Subdural hematoma without coma S/p MMA embolization on 1/28.  Repeat CT head 1/30 showed stable subdural hematomas.  Some agitation last but more somnolent this AM. Heparin level therapeutic. --Neurosurgery following, appreciate rec --STAT head CT --Pending transition to warfarin --PT/OT recommending SNF --Start delirium precautions, patient is high risk of ICU delirium  Combined systolic and diastolic HF Severe MR s/p mechanical MVR Echo on 07/22/2020 showed EF 20 to 25%, severely hypokinetic LV wall, moderately elevated PASP, stable MVR with mild regurg. Still euvolemic on exam with stable respiratory status. --Continue Coreg, hydra and Imdur --Strict I&O's, daily weights --Heparin bridge to warfarin  UTI Found to have WBCs on 2 UAs.  Remains asymptomatic.  Mild leukocytosis but no fevers. --Continue  ceftriaxone, discontinue after a short course.  --Trend WBCs, fever curve  Gross hematuria Resolved this morning. Patient has had microscopic hematuria for many years in the setting of warfarin use.  CT A/P showed possible proctitis but no abnormality to explain hematuria.  UOP of 1.3 liters last  24 hours.  Urine looks tea-colored this morning. --CTM --Discontinue Foley today  Warfarin-induced coagulopathy Unsure if patient was taking warfarin.  INR has been reversed with Kcentra. Some gross hematuria yesterday that has now resolved.  PT/INR 15.3/1.2 today. --Trend PT/INR --Plan to restart warfarin as above  COVID-19 positive Incidental finding on admission. On room air.    --Continue precautions --CTM  AKI on CKD3B Baseline creatinine around 1.3-1.4. Mild AKI likely due to decreased fluid intake.  Creatinine within normal limits. --Encourage p.o. intake --Avoid nephrotoxic's agent  HTN HLD BP remained stable with SBP in the 120s to 140s. LDL at goal at 65. --Continue antihypertensives as above --Continue on atorvastatin   Protein calorie malnutrition Functional decline Tolerating diet so far. Continue Ensure --RD following, appreciate rec  Hyperglycemia A1c 5.4.  CBGs remained stable and at goal. --SSI, moderate --CBG monitoring   GOC / Dispo Some concerns about patient's living situation and ability to care for self.  Son and daughter-in-law has agreed to take patient back to [place] to live with them after discharge.  -- TOC following  Best Practice (right click and "Reselect all SmartList Selections" daily)   Diet/type: Regular consistency (see orders) DVT prophylaxis: not indicated GI prophylaxis: N/A Lines: N/A Foley:  N/A Code Status:  full code Last date of multidisciplinary goals of care discussion [pending]  Labs   CBC: Recent Labs  Lab 05/05/21 1300 05/06/21 0504 05/08/21 0122 05/09/21 0606 05/10/21 0140 05/10/21 1321 05/11/21 0552  WBC 11.2*   < > 9.5 9.2 10.2 9.5 11.1*  NEUTROABS 9.5*  --   --   --   --   --   --   HGB 10.4*   < > 8.5* 8.0* 7.8* 8.0* 8.6*  HCT 32.1*   < > 26.2* 24.8* 25.1* 24.7* 26.4*  MCV 100.0   < > 100.0 100.4* 102.0* 99.6 98.9  PLT 302   < > 237 222 207 214 258   < > = values in this interval not displayed.     Basic Metabolic Panel: Recent Labs  Lab 05/07/21 0042 05/08/21 0122 05/09/21 0606 05/10/21 0140 05/10/21 1933 05/11/21 0552  NA 138 141 138 138 137 136  K 3.8 3.9 4.0 4.3 4.1 3.9  CL 105 107 105 105 104 102  CO2 26 26 26 26 27 26   GLUCOSE 150* 107* 119* 159* 153* 155*  BUN 30* 19 18 23 19 17   CREATININE 1.13* 0.90 1.04* 1.40* 1.00 0.92  CALCIUM 8.1* 8.0* 7.8* 8.1* 8.4* 8.5*  MG 1.6* 2.2  --   --  1.8  --    GFR: Estimated Creatinine Clearance: 42.4 mL/min (by C-G formula based on SCr of 0.92 mg/dL). Recent Labs  Lab 05/05/21 1300 05/06/21 0504 05/09/21 0606 05/10/21 0140 05/10/21 1321 05/11/21 0552  WBC 11.2*   < > 9.2 10.2 9.5 11.1*  LATICACIDVEN 2.8*  --   --   --   --   --    < > = values in this interval not displayed.    Liver Function Tests: Recent Labs  Lab 05/05/21 1300  AST 40  ALT 24  ALKPHOS 65  BILITOT 1.2  PROT 6.3*  ALBUMIN 3.2*  No results for input(s): LIPASE, AMYLASE in the last 168 hours. Recent Labs  Lab 05/05/21 1300  AMMONIA 25    ABG    Component Value Date/Time   HCO3 28.2 (H) 12/07/2006 1925   TCO2 28 05/10/2011 1420     Coagulation Profile: Recent Labs  Lab 05/07/21 0042 05/08/21 0122 05/09/21 0606 05/10/21 0140 05/11/21 0552  INR 1.1 1.2 1.4* 1.3* 1.2    Cardiac Enzymes: Recent Labs  Lab 05/05/21 1300  CKTOTAL 1,011*    HbA1C: Hgb A1c MFr Bld  Date/Time Value Ref Range Status  05/06/2021 09:06 AM 5.4 4.8 - 5.6 % Final    Comment:    (NOTE)         Prediabetes: 5.7 - 6.4         Diabetes: >6.4         Glycemic control for adults with diabetes: <7.0   12/04/2014 12:12 PM 5.9 (H) 4.8 - 5.6 % Final    Comment:    (NOTE)         Pre-diabetes: 5.7 - 6.4         Diabetes: >6.4         Glycemic control for adults with diabetes: <7.0     CBG: Recent Labs  Lab 05/09/21 2108 05/10/21 0748 05/10/21 1152 05/10/21 1619 05/10/21 2231  GLUCAP 156* 146* 186* 180* 176*     Critical care time:  30

## 2021-05-11 NOTE — NC FL2 (Signed)
[place] LEVEL OF CARE SCREENING TOOL     IDENTIFICATION  Patient Name: Deborah [name] Birthdate: 02/16/1946 Sex: female Admission Date (Current Location): 05/05/2021  [facility] and [place] Number:  Herbalist and Address:  [facility] [place] [address] [place], [place] [address]      Provider Number: [id number]  Attending Physician Name and Address:  Julian Hy, DO  Relative Name and Phone Number:  Salley Slaughter [phone]    Current Level of Care: Hospital Recommended Level of Care: [place] Prior Approval Number:    Date Approved/Denied:   PASRR Number: [id number]  Discharge Plan: SNF    Current Diagnoses: Patient Active Problem List   Diagnosis Date Noted   Malnutrition of moderate degree 05/06/2021   Subdural hematoma without coma 05/05/2021   Failure to thrive in adult 05/05/2021   Severe protein-calorie malnutrition ([place]) 05/05/2021   Recurrent falls 05/05/2021   Warfarin-induced coagulopathy ([place])    Acute on chronic anemia 07/21/2020   AKI (acute kidney injury) ([place]) 07/21/2020   Lactic acidosis 07/21/2020   Diabetes type 2, controlled ([place])    Chronic systolic CHF (congestive heart failure) ([place])    Pulmonary hypertension ([place])    HLD (hyperlipidemia)    ICD-St.Jude 08/02/2010   COPD, MILD 11/19/2009   Aortic valve disorder 07/30/2008   HYPERTENSION, BENIGN 01/24/2008   Mitral valve disorder. (Status post mechanical valve replacement) 01/24/2008    Orientation RESPIRATION BLADDER Height & Weight     Self, Time, Situation, Place  Normal   Weight: 135 lb 5.8 oz (61.4 kg) Height:  5\' 3"  (160 cm)  BEHAVIORAL SYMPTOMS/MOOD NEUROLOGICAL BOWEL NUTRITION STATUS        Diet (Please see DC summary)  AMBULATORY STATUS COMMUNICATION OF NEEDS Skin   Limited Assist Verbally PU Stage and Appropriate Care (Incision closed groin anterior proximal Foam dressing PRN)                        Personal Care Assistance Level of Assistance  Bathing, Feeding, Dressing Bathing Assistance: Maximum assistance Feeding assistance: Independent Dressing Assistance: Limited assistance     Functional Limitations Info  Sight, Hearing, Speech Sight Info: Adequate Hearing Info: Impaired Speech Info: Adequate    SPECIAL CARE FACTORS FREQUENCY  PT (By licensed PT), OT (By licensed OT)     PT Frequency: 5x/week OT Frequency: 5x/week            Contractures Contractures Info: Not present    Additional Factors Info  Code Status, Allergies, Isolation Precautions, Insulin Sliding Scale Code Status Info: Full Allergies Info: Strawberry Extract   Insulin Sliding Scale Info: See Med List Isolation Precautions Info: COVID+ last positive test on 05/05/21     Current Medications (05/11/2021):  This is the current hospital active medication list Current Facility-Administered Medications  Medication Dose Route Frequency Provider Last Rate Last Admin   acetaminophen (TYLENOL) tablet 650 mg  650 mg Oral Q4H PRN Agarwala, Einar Grad, MD   650 mg at 05/10/21 0510   atorvastatin (LIPITOR) tablet 40 mg  40 mg Oral Daily Agarwala, Einar Grad, MD   40 mg at 05/11/21 0928   carvedilol (COREG) tablet 12.5 mg  12.5 mg Oral BID WC Spero Geralds, MD   12.5 mg at 05/11/21 0743   cefTRIAXone (ROCEPHIN) 1 g in sodium chloride 0.9 % 100 mL IVPB  1 g Intravenous Q24H Noemi Chapel P, DO 200 mL/hr at  05/11/21 0930 1 g at 05/11/21 0930   Chlorhexidine Gluconate Cloth 2 % PADS 6 each  6 each Topical Daily Spero Geralds, MD   6 each at 05/10/21 1322   feeding supplement (GLUCERNA SHAKE) (GLUCERNA SHAKE) liquid 237 mL  237 mL Oral TID BM Candee Furbish, MD   237 mL at 05/11/21 0928   fentaNYL (SUBLIMAZE) injection 25 mcg  25 mcg Intravenous Q2H PRN Anders Simmonds, MD   25 mcg at 05/11/21 0110   hydrALAZINE (APRESOLINE) tablet 25 mg  25 mg Oral TID Kipp Brood, MD   25 mg at 05/11/21 0926   insulin aspart  (novoLOG) injection 0-15 Units  0-15 Units Subcutaneous TID WC Lacinda Axon, MD   3 Units at 05/11/21 0740   isosorbide mononitrate (IMDUR) 24 hr tablet 30 mg  30 mg Oral Daily Agarwala, Einar Grad, MD   30 mg at 05/11/21 0928   levETIRAcetam (KEPPRA) IVPB 500 mg/100 mL premix  500 mg Intravenous Q12H Meyran, Ocie Cornfield, NP       ondansetron (ZOFRAN) injection 4 mg  4 mg Intravenous Q6H PRN Agarwala, Einar Grad, MD       pantoprazole (PROTONIX) EC tablet 40 mg  40 mg Oral Daily Candee Furbish, MD   40 mg at 05/11/21 [id number]   polyethylene glycol (MIRALAX / GLYCOLAX) packet 17 g  17 g Oral Daily PRN Lacinda Axon, MD       sodium chloride flush (NS) 0.9 % injection 3 mL  3 mL Intravenous Q12H Kipp Brood, MD   3 mL at 05/11/21 [id number]     Discharge Medications: Please see discharge summary for a list of discharge medications.  Relevant Imaging Results:  Relevant Lab Results:   Additional Information SSN#: [id number]  [place] COVID-19 Vaccine 07/10/2019 , 06/09/2019  Arther Heisler, LCSW

## 2021-05-11 NOTE — Progress Notes (Signed)
[facility] Progress Note Patient Name: MONSERRATE WARNEKE DOB: 12/05/1945 MRN: [id number]   Date of Service  05/11/2021  HPI/Events of Note  Hypomagnesemia - Mg++ = 1.8.  eICU Interventions  Will replace Mg++.      Intervention Category Major Interventions: Electrolyte abnormality - evaluation and management  Janayla Marik Cornelia Copa 05/11/2021, 12:36 AM

## 2021-05-11 NOTE — Progress Notes (Addendum)
ANTICOAGULATION CONSULT NOTE   Pharmacy Consult for Heparin IV Indication:  Mechanical valve  Allergies  Allergen Reactions   Strawberry Extract Rash    Rash & itching.    Patient Measurements: Height: 5\' 3"  (160 cm) Weight: 61.4 kg (135 lb 5.8 oz) IBW/kg (Calculated) : 52.4 Heparin Dosing Weight: 61.4 kg  Vital Signs: Temp: 99.2 F (37.3 C) (01/31 0300) Temp Source: Axillary (01/31 0300) BP: 147/85 (01/31 0200) Pulse Rate: 83 (01/31 0600)  Labs: Recent Labs    05/09/21 0606 05/09/21 0607 05/10/21 0140 05/10/21 1214 05/10/21 1321 05/10/21 1933 05/10/21 2200 05/11/21 0552  HGB 8.0*  --  7.8*  --  8.0*  --   --  8.6*  HCT 24.8*  --  25.1*  --  24.7*  --   --  26.4*  PLT 222  --  207  --  214  --   --  258  LABPROT 17.0*  --  16.4*  --   --   --   --  15.3*  INR 1.4*  --  1.3*  --   --   --   --  1.2  HEPARINUNFRC  --    < > <0.10* 0.38  --   --  0.42 0.45  CREATININE 1.04*  --  1.40*  --   --  1.00  --  0.92   < > = values in this interval not displayed.     Estimated Creatinine Clearance: 42.4 mL/min (by C-G formula based on SCr of 0.92 mg/dL).   Medical History: Past Medical History:  Diagnosis Date   Acute gastric ulcer without hemorrhage or perforation    AICD (automatic cardioverter/defibrillator) present    Aortic stenosis, mild    mean gradient 29mmHg echo 09/09   CHF (congestive heart failure) (HCC)    --Non-ischemic CM EF 30-35% by echo 9/09 (Previous 25%)  --s/p St. Jude single chamber ICD 04/09 --Minimal  CAD by cath 2004 LAD 20% LCX 20% Ramus 30% RCA nl --echo 05/11 EF 20-25%   Diabetes mellitus    type 2   Hyperlipidemia    Hypertension    Kidney stones    MR (congenital mitral regurgitation)    Severe MR s/p St. Jude mechanical MVR by Gerrit Friends 2005    Medications:  Scheduled:   atorvastatin  40 mg Oral Daily   carvedilol  12.5 mg Oral BID WC   Chlorhexidine Gluconate Cloth  6 each Topical Daily   feeding supplement (GLUCERNA SHAKE)   237 mL Oral TID BM   hydrALAZINE  25 mg Oral TID   insulin aspart  0-15 Units Subcutaneous TID WC   isosorbide mononitrate  30 mg Oral Daily   pantoprazole  40 mg Oral Daily   sodium chloride flush  3 mL Intravenous Q12H   Infusions:   cefTRIAXone (ROCEPHIN)  IV Stopped (05/10/21 1248)   heparin 1,050 Units/hr (05/11/21 0600)  none  Assessment: 76 years of age female with history significant for congenital MR with St Judes mechanical valve 2005 on chronic warfarin who was admitted for AMS and weakness found to have INR >10 and bilateral SDH who received reversal of anticoagulation with Kcentra and Vitamin K IV on 1/25 and is now status post Onxy embolization of bilateral middle meningeal arteries on 1/27. Repeat CT Head is stable. Ok per Neurosurgery to resume anticoagulation with IV Heparin without boluses.   Plan per Neurosurgery - once patient is therapeutic on IV Heparin plan to repeat CT Head  and if remains stable, then resume Warfarin.  1/30 CT head unchanged SDH along bilateral cerebral convexities   Heparin level 0.45 is therapeutic on 1050 units/hr and slightly trending up. Per RN, hematuria slightly improved from yesterday's bright red color to more tea-colored this morning. No urological intervention planned at this time. Will decrease slightly to target low end of goal given SDH and hematuria.   Afternoon update: Heparin infusion was stopped and reversed with protamine after patient became increasingly somnolent and STAT CT demonstrated increased density of SDH. Heparin infusion was restarted (no bolus) at 1500 per neurosurgery. Will hold off on starting warfarin until more stable.  Goal of Therapy:  Heparin level 0.3 to 0.5 units/ml Monitor platelets by anticoagulation protocol: Yes   Plan:  Start heparin infusion at 1000 units/hr 6 hour heparin level Monitor heparin level, CBC and s/s of bleeding daily  F/u restart of warfarin per neurosurgery  Laurey Arrow,  PharmD PGY1 Pharmacy Resident 05/11/2021  2:47 PM  Please check [email or web address] for unit-specific pharmacy phone numbers.

## 2021-05-11 NOTE — Progress Notes (Signed)
ANTICOAGULATION CONSULT NOTE - Follow Up Consult  Pharmacy Consult for IV Heparin Indication:  Mechanical valve  MVR  Allergies  Allergen Reactions   Strawberry Extract Rash    Rash & itching.    Patient Measurements: Height: 5\' 3"  (160 cm) Weight: 61.4 kg (135 lb 5.8 oz) IBW/kg (Calculated) : 52.4 Heparin Dosing Weight: 61.4 kg  Vital Signs: Temp: 99 F (37.2 C) (01/31 1900) Temp Source: Axillary (01/31 1900) BP: 116/60 (01/31 2100) Pulse Rate: 87 (01/31 2100)  Labs: Recent Labs    05/09/21 0606 05/09/21 0607 05/10/21 0140 05/10/21 1214 05/10/21 1321 05/10/21 1933 05/10/21 2200 05/11/21 0552 05/11/21 2014  HGB 8.0*  --  7.8*  --  8.0*  --   --  8.6*  --   HCT 24.8*  --  25.1*  --  24.7*  --   --  26.4*  --   PLT 222  --  207  --  214  --   --  258  --   LABPROT 17.0*  --  16.4*  --   --   --   --  15.3*  --   INR 1.4*  --  1.3*  --   --   --   --  1.2  --   HEPARINUNFRC  --    < > <0.10*   < >  --   --  0.42 0.45 0.18*  CREATININE 1.04*  --  1.40*  --   --  1.00  --  0.92  --    < > = values in this interval not displayed.    Estimated Creatinine Clearance: 42.4 mL/min (by C-G formula based on SCr of 0.92 mg/dL).  Medical History: Past Medical History:  Diagnosis Date   Acute gastric ulcer without hemorrhage or perforation    AICD (automatic cardioverter/defibrillator) present    Aortic stenosis, mild    mean gradient 05/13/21 echo 09/09   CHF (congestive heart failure) (HCC)    --Non-ischemic CM EF 30-35% by echo 9/09 (Previous 25%)  --s/p St. Jude single chamber ICD 04/09 --Minimal  CAD by cath 2004 LAD 20% LCX 20% Ramus 30% RCA nl --echo 05/11 EF 20-25%   Diabetes mellitus    type 2   Hyperlipidemia    Hypertension    Kidney stones    MR (congenital mitral regurgitation)    Severe MR s/p St. Jude mechanical MVR by 07/11 2005    Assessment: 76 yr old woman with hx of congenital MR, S/P St Jude mechanical MVR in 2005, on chronic warfarin was  admitted for AMS and weakness; pt was found to have INR >10 and bilateral SDH (S/P reversal of anticoagulation with Kcentra and Vitamin K IV on 1/25). Pt is S/P Onxy embolization of bilateral middle meningeal arteries on 1/27. Repeat CT Head is stable. Ok per Neurosurgery to resume anticoagulation with IV heparin without boluses.    1/31 STAT CT demonstrated increased density of SDH. Heparin infusion was restarted (no bolus) at 1500 per neurosurgery. Will hold off on starting warfarin until more stable.  Heparin level ~7 hrs after resuming heparin infusion at 1000 units/hr (after protamine reversal) was 0.18 units/ml, which is below goal range.  No issues with infusion or bleeding per RN. Still having tea-colored urine and is responsive but will not talk to RN at this time (day shift RN reported more interaction). Given worsening SDH earlier today and HL up to 0.45 on 1050 units/hr 1/30, will continue current rate with  anticipated up trend in heparin level with AM labs.    Goal of Therapy:  Heparin level 0.3 to 0.5 units/ml Monitor platelets by anticoagulation protocol: Yes   Plan:  Continue Heparin at 1000 units/hr  Monitor heparin level, CBC and s/s of bleeding daily  F/u restart of warfarin per neurosurgery   Alphia Moh, PharmD, BCPS, BCCP Clinical Pharmacist  Please check AMION for all [facility] Pharmacy phone numbers After 10:00 PM, call Main Pharmacy [phone]

## 2021-05-12 DIAGNOSIS — S065XAA Traumatic subdural hemorrhage with loss of consciousness status unknown, initial encounter: Secondary | ICD-10-CM | POA: Diagnosis not present

## 2021-05-12 DIAGNOSIS — Z7901 Long term (current) use of anticoagulants: Secondary | ICD-10-CM | POA: Diagnosis not present

## 2021-05-12 DIAGNOSIS — N390 Urinary tract infection, site not specified: Secondary | ICD-10-CM | POA: Diagnosis not present

## 2021-05-12 DIAGNOSIS — N3001 Acute cystitis with hematuria: Secondary | ICD-10-CM

## 2021-05-12 DIAGNOSIS — I5020 Unspecified systolic (congestive) heart failure: Secondary | ICD-10-CM

## 2021-05-12 DIAGNOSIS — B964 Proteus (mirabilis) (morganii) as the cause of diseases classified elsewhere: Secondary | ICD-10-CM

## 2021-05-12 LAB — CBC
HCT: 25.8 % — ABNORMAL LOW (ref 36.0–46.0)
Hemoglobin: 8.3 g/dL — ABNORMAL LOW (ref 12.0–15.0)
MCH: 32.3 pg (ref 26.0–34.0)
MCHC: 32.2 g/dL (ref 30.0–36.0)
MCV: 100.4 fL — ABNORMAL HIGH (ref 80.0–100.0)
Platelets: 252 10*3/uL (ref 150–400)
RBC: 2.57 MIL/uL — ABNORMAL LOW (ref 3.87–5.11)
RDW: 13.8 % (ref 11.5–15.5)
WBC: 9.9 10*3/uL (ref 4.0–10.5)
nRBC: 0 % (ref 0.0–0.2)

## 2021-05-12 LAB — GLUCOSE, CAPILLARY
Glucose-Capillary: 79 mg/dL (ref 70–99)
Glucose-Capillary: 85 mg/dL (ref 70–99)
Glucose-Capillary: 86 mg/dL (ref 70–99)
Glucose-Capillary: 183 mg/dL — ABNORMAL HIGH (ref 70–99)
Glucose-Capillary: 154 mg/dL — ABNORMAL HIGH (ref 70–99)

## 2021-05-12 LAB — URINE CULTURE: Culture: >=100000 — AB

## 2021-05-12 LAB — PROTIME-INR
INR: 1.2 (ref 0.8–1.2)
Prothrombin Time: 15.1 seconds (ref 11.4–15.2)

## 2021-05-12 LAB — BASIC METABOLIC PANEL
Anion gap: 7 (ref 5–15)
BUN: 13 mg/dL (ref 8–23)
CO2: 27 mmol/L (ref 22–32)
Calcium: 8.3 mg/dL — ABNORMAL LOW (ref 8.9–10.3)
Chloride: 104 mmol/L (ref 98–111)
Creatinine, Ser: 1.05 mg/dL — ABNORMAL HIGH (ref 0.44–1.00)
GFR, Estimated: 55 mL/min — ABNORMAL LOW (ref >60)
Glucose, Bld: 81 mg/dL (ref 70–99)
Potassium: 3.6 mmol/L (ref 3.5–5.1)
Sodium: 138 mmol/L (ref 135–145)

## 2021-05-12 LAB — HEPARIN LEVEL (UNFRACTIONATED)
Heparin Unfractionated: 0.18 IU/mL — ABNORMAL LOW (ref 0.30–0.70)
Heparin Unfractionated: 0.16 IU/mL — ABNORMAL LOW (ref 0.30–0.70)

## 2021-05-12 MED ORDER — CEPHALEXIN 500 MG PO CAPS
500.0000 mg | ORAL_CAPSULE | Freq: Four times a day (QID) | ORAL | Status: DC
  Administered 2021-05-13 (×3): 500 mg via ORAL
  Filled 2021-05-12 (×5): qty 1

## 2021-05-12 MED ORDER — LEVETIRACETAM 500 MG PO TABS
500.0000 mg | ORAL_TABLET | Freq: Two times a day (BID) | ORAL | Status: AC
  Administered 2021-05-12 – 2021-05-17 (×12): 500 mg via ORAL
  Filled 2021-05-12 (×14): qty 1

## 2021-05-12 MED ORDER — LACTATED RINGERS IV BOLUS
500.0000 mL | Freq: Once | INTRAVENOUS | Status: AC
  Administered 2021-05-12: 500 mL via INTRAVENOUS

## 2021-05-12 MED ORDER — ENSURE ENLIVE PO LIQD
237.0000 mL | Freq: Three times a day (TID) | ORAL | Status: DC
  Administered 2021-05-13 – 2021-05-18 (×13): 237 mL via ORAL

## 2021-05-12 MED ORDER — WARFARIN SODIUM 7.5 MG PO TABS
7.5000 mg | ORAL_TABLET | Freq: Once | ORAL | Status: AC
Start: 2021-05-12 — End: 2021-05-12
  Administered 2021-05-12: 7.5 mg via ORAL
  Filled 2021-05-12: qty 1

## 2021-05-12 MED ORDER — WARFARIN - PHARMACIST DOSING INPATIENT: Freq: Every day | Status: DC

## 2021-05-12 NOTE — Progress Notes (Signed)
ANTICOAGULATION CONSULT NOTE   Pharmacy Consult for Heparin IV Indication:  Mechanical valve  Allergies  Allergen Reactions   Strawberry Extract Rash    Rash & itching.    Patient Measurements: Height: 5\' 3"  (160 cm) Weight: 61.4 kg (135 lb 5.8 oz) IBW/kg (Calculated) : 52.4 Heparin Dosing Weight: 61.4 kg  Vital Signs: Temp: 99.1 F (37.3 C) (02/01 0748) Temp Source: Oral (02/01 0748) BP: 131/83 (02/01 0600) Pulse Rate: 78 (02/01 0600)  Labs: Recent Labs    05/10/21 0140 05/10/21 1214 05/10/21 1321 05/10/21 1933 05/10/21 2200 05/11/21 0552 05/11/21 2014 05/12/21 0500  HGB 7.8*  --  8.0*  --   --  8.6*  --  8.3*  HCT 25.1*  --  24.7*  --   --  26.4*  --  25.8*  PLT 207  --  214  --   --  258  --  252  LABPROT 16.4*  --   --   --   --  15.3*  --  15.1  INR 1.3*  --   --   --   --  1.2  --  1.2  HEPARINUNFRC <0.10*   < >  --   --    < > 0.45 0.18* 0.18*  CREATININE 1.40*  --   --  1.00  --  0.92  --  1.05*   < > = values in this interval not displayed.     Estimated Creatinine Clearance: 37.1 mL/min (A) (by C-G formula based on SCr of 1.05 mg/dL (H)).   Medical History: Past Medical History:  Diagnosis Date   Acute gastric ulcer without hemorrhage or perforation    AICD (automatic cardioverter/defibrillator) present    Aortic stenosis, mild    mean gradient 07/10/21 echo 09/09   CHF (congestive heart failure) (HCC)    --Non-ischemic CM EF 30-35% by echo 9/09 (Previous 25%)  --s/p St. Jude single chamber ICD 04/09 --Minimal  CAD by cath 2004 LAD 20% LCX 20% Ramus 30% RCA nl --echo 05/11 EF 20-25%   Diabetes mellitus    type 2   Hyperlipidemia    Hypertension    Kidney stones    MR (congenital mitral regurgitation)    Severe MR s/p St. Jude mechanical MVR by 07/11 2005    Medications:  Scheduled:   atorvastatin  40 mg Oral Daily   carvedilol  12.5 mg Oral BID WC   Chlorhexidine Gluconate Cloth  6 each Topical Daily   feeding supplement (GLUCERNA  SHAKE)  237 mL Oral TID BM   hydrALAZINE  25 mg Oral TID   insulin aspart  0-15 Units Subcutaneous TID WC   isosorbide mononitrate  30 mg Oral Daily   pantoprazole  40 mg Oral Daily   sodium chloride flush  3 mL Intravenous Q12H   Infusions:   cefTRIAXone (ROCEPHIN)  IV Stopped (05/11/21 1000)   heparin 1,050 Units/hr (05/12/21 0800)   levETIRAcetam Stopped (05/11/21 2342)  none  Assessment: 76 years of age female with history significant for congenital MR with St Judes mechanical valve 2005 on chronic warfarin who was admitted for AMS and weakness found to have INR >10 and bilateral SDH who received reversal of anticoagulation with Kcentra and Vitamin K IV on 1/25 and is now status post Onxy embolization of bilateral middle meningeal arteries on 1/27. Repeat CT Head is stable. Ok per Neurosurgery to resume anticoagulation with IV Heparin without boluses.   Plan per Neurosurgery- will resume warfarin and  bridge with heparin.  1/30 CT head unchanged SDH along bilateral cerebral convexities   1/31: Heparin infusion was stopped and reversed with protamine after patient became increasingly somnolent and STAT CT demonstrated increased density of SDH. Heparin infusion was restarted (no bolus) at 1500 per neurosurgery.  2/1 HL returned at 0.16- not at goal. Will plan to slowly increase rate with SDH.  2/1 AM update:  Heparin level remains low  Goal of Therapy:  Heparin level 0.3 to 0.5 units/ml (closer to ~0.3) Monitor platelets by anticoagulation protocol: Yes   Plan:  Increase heparin to 1100 units/hr Start Coumadin 7.5 mg PO x dose at 1600 8 hour heparin level Monitor CBC and s/sx of bleeding  Shirlee More, PharmD PGY2 Infectious Diseases Pharmacy Resident   Please check [email or web address] for unit-specific pharmacy phone numbers

## 2021-05-12 NOTE — Progress Notes (Signed)
NAME:  Deborah [name], MRN:  [id number], DOB:  09/22/1945, LOS: 7 ADMISSION DATE:  05/05/2021, CONSULTATION DATE: 05/05/21 REFERRING MD: Dr. Karle Starch, CHIEF COMPLAINT: AMS  History of Present Illness:  76 year old female with prior history significant for HFrEF (4/22 EF 20-25%), AICD, HTN, congential MR s/p MVR St. Judes on coumadin, HLD, DM, and anemia who presented to [facility] ER by EMS with altered mental status and weakness.   Patient lives alone.  Her sister was unable to reach her yesterday or today.  They went to check on her and she was found her on the bathroom floor.  EMS report notes that she stated she had lost control of her bladder and sat on the floor to clean her self but was too weak to get up.  Denied falling, dizziness, syncope, etc.  Also reports not eating/ drinking for 16 hours.  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 Judes on coumadin, HLD, DM, anemia, prior GI bleed   Significant Hospital Events: Including procedures, antibiotic start and stop dates in addition to other pertinent events   1/25 Admitted, NSGY consulted, reversed with Eppie Gibson 1/25 CT head; sizable acute hemispheric subdural hematoma with mass-effect on right frontal lobe 1/26 CT head; progressive bilateral subdural hematoma with increased mass-effect on frontal lobe. 1/27 MMA embolization with nsgy 1/28 resumed heparin no bolus 1/30 renal US; no hydronephrosis or stones 1/30 CT head; stable bilateral subdural hematomas 1/30 CT AP; diverticulosis, possible proctitis 2/01 concern for rebleed yesterday given decreased mentation by afternoon patient became more alert and interactive.  Today she remains alert and ambulatory.  Interim History / Subjective:  No acute events overnight, remains alert and interactive Hard of hearing  Objective   Blood pressure 131/83, pulse 78, temperature 99.1 F (37.3 C), temperature  source Oral, resp. rate (!) 21, height 5\' 3"  (1.6 m), weight 61.4 kg, SpO2 93 %.        Intake/Output Summary (Last 24 hours) at 05/12/2021 [id number] Last data filed at 05/12/2021 0800 Gross per 24 hour  Intake 637.19 ml  Output 525 ml  Net 112.19 ml    Filed Weights   05/05/21 1241 05/07/21 0600  Weight: 67.9 kg 61.4 kg    Examination: General: Thin slightly deconditioned elderly female lying in bed in no acute distress HEENT: Portal/AT, MM pink/moist, PERRL, HOH Neuro: Alert and oriented x2, slightly confused,  CV: s1s2 regular rate and rhythm, no murmur, rubs, or gallops,  PULM: Clear to auscultation bilaterally, no increased work of breathing, no added breath sounds, on room air GI: soft, bowel sounds active in all 4 quadrants, non-tender, non-distended, tolerating oral diet Extremities: warm/dry, no edema  Skin: no rashes or lesions  Resolved Hospital Problem list    Assessment & Plan:   Subdural hematoma without coma -S/p MMA embolization on 1/28.  Repeat CT head 1/30 showed stable subdural hematomas.  Some agitation last but more somnolent this AM. Heparin level therapeutic. -Repeat head CT obtained 1/31 given concern for rebleed, neurosurgery evaluated and SDH appear stable. P: Neurosurgery consulted, appreciate assistance Maintain neuro protective measures Nutrition and bowel regiment  Seizure precautions  Aspirations precautions  Continue PT/OT efforts, currently recommendations for SNF Delirium precautions  Combined systolic and diastolic HF Severe MR s/p mechanical MVR -Echo on 07/22/2020 showed EF 20 to 25%, severely hypokinetic LV wall, moderately elevated PASP, stable MVR with mild regurg. Still euvolemic  on exam with stable respiratory status. Hx HTN/HLD -BP remained stable with SBP in the 120s to 140s. LDL at goal at 65. P: Continuous telemetry  ASA and statin continue statin Heart health diet with sodium restriction when able  Strict intake and output  Daily  weight to assess volume status Closely monitor renal function and electrolytes  Ensure hemodynamic control  Continue carvedilol, hydralazine, and Imdur Heparin bridge to warfarin today  UTI -Found to have WBCs on 2 UAs.  Remains asymptomatic.  Mild leukocytosis but no fevers. Gross hematuria -Resolved this morning. Patient has had microscopic hematuria for many years in the setting of warfarin use.  CT A/P showed possible proctitis but no abnormality to explain hematuria.  UOP of 1.3 liters last 24 hours.  Urine looks tea-colored this morning. P: Continue IV ceftriaxone, will add stop date Continue to trend CBC and fever curve  Warfarin-induced coagulopathy -Unsure if patient was taking warfarin.  INR has been reversed with Kcentra. Some gross hematuria yesterday that has now resolved.  PT/INR 15.3/1.2 today. P: Trend PT/INR IV heparin to p.o. warfarin bridge  COVID-19 positive -Incidental finding on admission. On room air.    P: Isolation precautions Supportive care  AKI on CKD3B -Baseline creatinine around 1.3-1.4. Mild AKI likely due to decreased fluid intake.  Creatinine within normal limits. P: Follow renal function  Monitor urine output Trend Bmet Avoid nephrotoxins Ensure adequate renal perfusion  Oral hydration  Protein calorie malnutrition Functional decline -Tolerating diet so far. Continue Ensure P: Continue to encourage protein supplementation Registered dietitian following  Hyperglycemia -A1c 5.4.  CBGs remained stable and at goal. P: Continue SSI CBG goal 140-180 Check CBG every 4 hours   GOC / Dispo Some concerns about patient's living situation and ability to care for self.  Son and daughter-in-law has agreed to take patient back to [place] to live with them after discharge.   Stable to transfer out of ICU today with hospitalist take over tomorrow  Best Practice (right click and "Reselect all SmartList Selections" daily)   Diet/type: Regular  consistency (see orders) DVT prophylaxis: not indicated GI prophylaxis: N/A Lines: N/A Foley:  N/A Code Status:  full code Last date of multidisciplinary goals of care discussion as above  Signature:   Aayana Reinertsen D. Kenton Kingfisher, NP-C [facility] Personal contact information can be found on Amion  05/12/2021, 8:44 AM

## 2021-05-12 NOTE — Progress Notes (Signed)
Nutrition Follow-up  DOCUMENTATION CODES:   Non-severe (moderate) malnutrition in context of chronic illness  INTERVENTION:   Continue regular diet.  Change supplement to Ensure Enlive po TID, each supplement provides 350 kcal and 20 grams of protein.  Continue MVI with minerals daily.  Consider Cortrak tube placement for supplemental enteral nutrition if oral intake of meals and supplements remains poor.    NUTRITION DIAGNOSIS:   Moderate Malnutrition related to chronic illness (CHF) as evidenced by mild fat depletion, mild muscle depletion, moderate muscle depletion.  GOAL:   Patient will meet greater than or equal to 90% of their needs  MONITOR:   PO intake, Supplement acceptance, Labs  REASON FOR ASSESSMENT:   Consult Assessment of nutrition requirement/status  ASSESSMENT:   76 yo female admitted with SDH, COVID 19 positive. PMH includes CHF, AICD, mitral valve replacement, DM, HLD, HTN.  Discussed patient in ICU rounds and with RN today. Supplement was changed to Glucerna Shake 1/26 d/t patient not wanting to drink it because her blood sugars were elevated. Two days ago, patient was consuming ~50% of meals on average. Yesterday she ate 0-10% of meals. She did not eat any breakfast today, but ate 80% of lunch. RN reports patient is very tired. Suspect this is a barrier to oral intake. Will change supplement back to Ensure Enlive/Plus to provide more calories and protein than Glucerna.   Labs reviewed.  CBG: [id number]  Medications reviewed and include Novolog, Warfarin.  Patient was re-weighed on 1/27 at 61.4 kg. She has had 12% weight loss within the past 4 months, which is severe. She is at least moderately malnourished and nutrition status is not improving with ongoing poor oral intake. May need to consider Cortrak tube placement for supplemental enteral nutrition given increased needs d/t acute (COVID-19+) and chronic (CHF) illnesses.   Diet Order:   Diet Order              Diet regular Room service appropriate? Yes; Fluid consistency: Thin  Diet effective now                   EDUCATION NEEDS:   No education needs have been identified at this time  Skin:  Skin Assessment: Reviewed RN Assessment  Last BM:  2/1 type 6  Height:   Ht Readings from Last 1 Encounters:  05/05/21 5\' 3"  (1.6 m)    Weight:   Wt Readings from Last 1 Encounters:  05/07/21 61.4 kg    BMI:  Body mass index is 23.98 kg/m.  Estimated Nutritional Needs:   Kcal:  1700-1900  Protein:  85-100 gm  Fluid:  1.7-1.9 L    05/09/21 RD, LDN, CNSC Please refer to Amion for contact information.

## 2021-05-12 NOTE — Progress Notes (Signed)
Speech Language Pathology Treatment: Cognitive-Linquistic  Patient Details Name: LAURAN ROMANSKI MRN: [id number] DOB: [birth year] Today's Date: 05/12/2021 Time: 4818-5631 SLP Time Calculation (min) (ACUTE ONLY): 15 min  Assessment / Plan / Recommendation Clinical Impression  Patient seen with son and RN in room for skilled SLP intervention focused on cognitive function goals. Patient is very [facility] and son recommended SLP stand on right side for optimal hearing. He reported that she does have hearing aides but they are not here in hospital. Patient is awake and alert but reluctant to respond to questions. She was oriented to month, year, date but when asked DOW, she would just repeat "February 1st". She would not elaborate when answering questions, such as when asked why she was in the hospital, saying "I got sick" and when asked how long she had been in the hospital saying "too long". SLP spoke with her son regarding his impressions of patient's current cognitive function and he feels she is at or near baseline. He thinks that her not having her hearing aides makes things difficult as well. SLP is recommending for patient to be screened versus evaluated at next venue of care by SLP but to s/o and end acute care level SLP intevention as it is not beneficial at this time.     HPI HPI: 76 yo admitted 1/25 after found on bathroom floor by family. Pt with bifrontal and acute parafalcine SDH, pt denies fall. PMHx: CHF, FTT, HOH, DM, HLD, HTN      SLP Plan  Discharge SLP treatment due to (comment) (not participating, recommending SLP at next venue of care)      Recommendations for follow up therapy are one component of a multi-disciplinary discharge planning process, led by the attending physician.  Recommendations may be updated based on patient status, additional functional criteria and insurance authorization.    Recommendations                   Follow Up Recommendations: Skilled nursing-short  term rehab (<3 hours/day) Assistance recommended at discharge: Frequent or constant Supervision/Assistance SLP Visit Diagnosis: Cognitive communication deficit (S97.026) Plan: Discharge SLP treatment due to (comment) (not participating, recommending SLP at next venue of care)          Angela Nevin, MA, CCC-SLP Speech Therapy

## 2021-05-12 NOTE — Progress Notes (Signed)
ANTICOAGULATION CONSULT NOTE   Pharmacy Consult for Heparin IV Indication:  Mechanical valve  Allergies  Allergen Reactions   Strawberry Extract Rash    Rash & itching.    Patient Measurements: Height: 5\' 3"  (160 cm) Weight: 61.4 kg (135 lb 5.8 oz) IBW/kg (Calculated) : 52.4 Heparin Dosing Weight: 61.4 kg  Vital Signs: Temp: 99.1 F (37.3 C) (02/01 0400) Temp Source: Oral (02/01 0400) BP: 113/64 (02/01 0230) Pulse Rate: 69 (02/01 0200)  Labs: Recent Labs    05/10/21 0140 05/10/21 1214 05/10/21 1321 05/10/21 1933 05/10/21 2200 05/11/21 0552 05/11/21 2014 05/12/21 0500  HGB 7.8*  --  8.0*  --   --  8.6*  --  8.3*  HCT 25.1*  --  24.7*  --   --  26.4*  --  25.8*  PLT 207  --  214  --   --  258  --  252  LABPROT 16.4*  --   --   --   --  15.3*  --  15.1  INR 1.3*  --   --   --   --  1.2  --  1.2  HEPARINUNFRC <0.10*   < >  --   --    < > 0.45 0.18* 0.18*  CREATININE 1.40*  --   --  1.00  --  0.92  --  1.05*   < > = values in this interval not displayed.     Estimated Creatinine Clearance: 37.1 mL/min (A) (by C-G formula based on SCr of 1.05 mg/dL (H)).   Medical History: Past Medical History:  Diagnosis Date   Acute gastric ulcer without hemorrhage or perforation    AICD (automatic cardioverter/defibrillator) present    Aortic stenosis, mild    mean gradient echo 09/09   CHF (congestive heart failure) (HCC)    --Non-ischemic CM EF 30-35% by echo 9/09 (Previous 25%)  --s/p St. Jude single chamber ICD 04/09 --Minimal  CAD by cath 2004 LAD 20% LCX 20% Ramus 30% RCA nl --echo 05/11 EF 20-25%   Diabetes mellitus    type 2   Hyperlipidemia    Hypertension    Kidney stones    MR (congenital mitral regurgitation)    Severe MR s/p St. Jude mechanical MVR by Gasper Lloyd 2005    Medications:  Scheduled:   atorvastatin  40 mg Oral Daily   carvedilol  12.5 mg Oral BID WC   Chlorhexidine Gluconate Cloth  6 each Topical Daily   feeding supplement (GLUCERNA  SHAKE)  237 mL Oral TID BM   hydrALAZINE  25 mg Oral TID   insulin aspart  0-15 Units Subcutaneous TID WC   isosorbide mononitrate  30 mg Oral Daily   pantoprazole  40 mg Oral Daily   sodium chloride flush  3 mL Intravenous Q12H   Infusions:   cefTRIAXone (ROCEPHIN)  IV Stopped (05/11/21 1000)   heparin 1,000 Units/hr (05/12/21 0400)   levETIRAcetam Stopped (05/11/21 2342)  none  Assessment: 76 years of age female with history significant for congenital MR with St Judes mechanical valve 2005 on chronic warfarin who was admitted for AMS and weakness found to have INR >10 and bilateral SDH who received reversal of anticoagulation with Kcentra and Vitamin K IV on 1/25 and is now status post Onxy embolization of bilateral middle meningeal arteries on 1/27. Repeat CT Head is stable. Ok per Neurosurgery to resume anticoagulation with IV Heparin without boluses.   Plan per Neurosurgery - once patient is  therapeutic on IV Heparin plan to repeat CT Head and if remains stable, then resume Warfarin.  1/30 CT head unchanged SDH along bilateral cerebral convexities   Heparin level 0.45 is therapeutic on 1050 units/hr and slightly trending up. Per RN, hematuria slightly improved from yesterday's bright red color to more tea-colored this morning. No urological intervention planned at this time. Will decrease slightly to target low end of goal given SDH and hematuria.   1/31Afternoon update: Heparin infusion was stopped and reversed with protamine after patient became increasingly somnolent and STAT CT demonstrated increased density of SDH. Heparin infusion was restarted (no bolus) at 1500 per neurosurgery. Will hold off on starting warfarin until more stable.  2/1 AM update:  Heparin level remains low  Goal of Therapy:  Heparin level 0.3 to 0.5 units/ml (closer to ~0.3) Monitor platelets by anticoagulation protocol: Yes   Plan:  Inc heparin slightly to 1050 units/hr 1300 heparin level  Narda Bonds, PharmD, BCPS Clinical Pharmacist Phone: [phone]

## 2021-05-12 NOTE — Progress Notes (Signed)
She looks better, OB to chair, progressing with therapies. I will out of town the rest of the week, please call on-call neurosurgery with any questions or concerns. I will plan to see her 1-2 wks after discharge or Monday if she is still here

## 2021-05-12 NOTE — Plan of Care (Signed)

## 2021-05-12 NOTE — Progress Notes (Signed)
Physical Therapy Treatment Patient Details Name: Deborah [name] MRN: [id number] DOB: [birth year] Today's Date: 05/12/2021   History of Present Illness 76 yo admitted 1/25 after found on bathroom floor by family. Pt with bifrontal and acute parafalcine SDH, pt denies fall. s/p 1/27 Onyx embolization of bilateral middle meningeal arteries PMHx: CHF, FTT, HOH, DM, HLD, HTN    PT Comments    Pt making steady progress with mobility. Continue to recommend SNF at DC.    Recommendations for follow up therapy are one component of a multi-disciplinary discharge planning process, led by the attending physician.  Recommendations may be updated based on patient status, additional functional criteria and insurance authorization.  Follow Up Recommendations  Skilled nursing-short term rehab (<3 hours/day)     Assistance Recommended at Discharge Frequent or constant Supervision/Assistance  Patient can return home with the following A little help with walking and/or transfers;Help with stairs or ramp for entrance;Assistance with cooking/housework;Direct supervision/assist for financial management;Direct supervision/assist for medications management;Assist for transportation   Equipment Recommendations  Rolling walker (2 wheels);BSC/3in1    Recommendations for Other Services       Precautions / Restrictions Precautions Precautions: Fall Restrictions Weight Bearing Restrictions: No     Mobility  Bed Mobility               General bed mobility comments: Pt up in chair    Transfers Overall transfer level: Needs assistance Equipment used: Rolling walker (2 wheels) Transfers: Sit to/from Stand Sit to Stand: Min assist           General transfer comment: Assist for stability to rise    Ambulation/Gait Ambulation/Gait assistance: Min assist Gait Distance (Feet): 60 Feet Assistive device: Rolling walker (2 wheels), 1 person hand held assist Gait Pattern/deviations: Decreased stride  length, Step-through pattern Gait velocity: decr Gait velocity interpretation: <1.31 ft/sec, indicative of household ambulator   General Gait Details: Assist for balance and intermittent assist to manage walker   Stairs             Wheelchair Mobility    Modified Rankin (Stroke Patients Only) Modified Rankin (Stroke Patients Only) Pre-Morbid Rankin Score: No symptoms Modified Rankin: Moderately severe disability     Balance Overall balance assessment: Needs assistance, History of Falls Sitting-balance support: No upper extremity supported, Feet supported Sitting balance-Leahy Scale: Fair     Standing balance support: Bilateral upper extremity supported Standing balance-Leahy Scale: Poor Standing balance comment: walker and min guard for static standing                            Cognition Arousal/Alertness: Awake/alert Behavior During Therapy: Flat affect Overall Cognitive Status: Impaired/Different from baseline Area of Impairment: Problem solving, Awareness, Following commands                       Following Commands: Follows one step commands consistently, Follows one step commands with increased time   Awareness: Intellectual Problem Solving: Slow processing, Requires verbal cues          Exercises      General Comments        Pertinent Vitals/Pain Pain Assessment Pain Assessment: No/denies pain    Home Living                          Prior Function            PT Goals (current goals  can now be found in the care plan section) Acute Rehab PT Goals Patient Stated Goal: be independent Progress towards PT goals: Progressing toward goals    Frequency    Min 3X/week      PT Plan Current plan remains appropriate    Co-evaluation              AM-PAC PT "6 Clicks" Mobility   Outcome Measure  Help needed turning from your back to your side while in a flat bed without using bedrails?: A Little Help  needed moving from lying on your back to sitting on the side of a flat bed without using bedrails?: A Little Help needed moving to and from a bed to a chair (including a wheelchair)?: A Little Help needed standing up from a chair using your arms (e.g., wheelchair or bedside chair)?: A Little Help needed to walk in hospital room?: A Little Help needed climbing 3-5 steps with a railing? : A Lot 6 Click Score: 17    End of Session   Activity Tolerance: Patient tolerated treatment well Patient left: in chair;with call bell/phone within reach;with chair alarm set   PT Visit Diagnosis: Other abnormalities of gait and mobility (R26.89);Difficulty in walking, not elsewhere classified (R26.2);Muscle weakness (generalized) (M62.81)     Time: 7124-5809 PT Time Calculation (min) (ACUTE ONLY): 24 min  Charges:  $Gait Training: 23-37 mins                     [facility] PT Acute Rehabilitation Services Pager [phone] Office [phone]    Angelina Ok [facility] 05/12/2021, 10:11 AM

## 2021-05-12 NOTE — TOC Progression Note (Addendum)
Transition of Care [facility]) - Progression Note    Patient Details  Name: Deborah [name] MRN: [id number] Date of Birth: [birth year]  Transition of Care [facility]) CM/SW Contact  Doyal Saric, LCSW Phone Number: 05/12/2021, 12:22 PM  Clinical Narrative:    HF CSW attempted to outreach Deborah [name] and her son Deborah [name] [phone] however he didn't not answer and the CSW left a voicemail for him to return the call. 1:42pm - HF CSW spoke with Deborah [name] over the phone and he reported that he did get the paperwork and bed offers and he and his wife will take a look and get back to the CSW.  CSW will continue to follow throughout discharge.   Expected Discharge Plan: Skilled Nursing Facility Barriers to Discharge: Continued Medical Work up  Expected Discharge Plan and Services Expected Discharge Plan: Skilled Nursing Facility In-house Referral: Clinical Social Work Discharge Planning Services: CM Consult Post Acute Care Choice: Skilled Nursing Facility Living arrangements for the past 2 months: Apartment                                       Social Determinants of Health (SDOH) Interventions    Readmission Risk Interventions No flowsheet data found.

## 2021-05-13 LAB — PROTIME-INR
INR: 1.1 (ref 0.8–1.2)
Prothrombin Time: 14.6 seconds (ref 11.4–15.2)

## 2021-05-13 LAB — CBC
HCT: 24.4 % — ABNORMAL LOW (ref 36.0–46.0)
Hemoglobin: 7.9 g/dL — ABNORMAL LOW (ref 12.0–15.0)
MCH: 32.4 pg (ref 26.0–34.0)
MCHC: 32.4 g/dL (ref 30.0–36.0)
MCV: 100 fL (ref 80.0–100.0)
Platelets: 246 10*3/uL (ref 150–400)
RBC: 2.44 MIL/uL — ABNORMAL LOW (ref 3.87–5.11)
RDW: 14 % (ref 11.5–15.5)
WBC: 9.7 10*3/uL (ref 4.0–10.5)
nRBC: 0 % (ref 0.0–0.2)

## 2021-05-13 LAB — GLUCOSE, CAPILLARY
Glucose-Capillary: 142 mg/dL — ABNORMAL HIGH (ref 70–99)
Glucose-Capillary: 157 mg/dL — ABNORMAL HIGH (ref 70–99)
Glucose-Capillary: 153 mg/dL — ABNORMAL HIGH (ref 70–99)
Glucose-Capillary: 130 mg/dL — ABNORMAL HIGH (ref 70–99)

## 2021-05-13 LAB — HEPARIN LEVEL (UNFRACTIONATED)
Heparin Unfractionated: 0.2 IU/mL — ABNORMAL LOW (ref 0.30–0.70)
Heparin Unfractionated: 0.4 IU/mL (ref 0.30–0.70)

## 2021-05-13 LAB — BASIC METABOLIC PANEL
Anion gap: 8 (ref 5–15)
BUN: 17 mg/dL (ref 8–23)
CO2: 27 mmol/L (ref 22–32)
Calcium: 8.6 mg/dL — ABNORMAL LOW (ref 8.9–10.3)
Chloride: 104 mmol/L (ref 98–111)
Creatinine, Ser: 1.12 mg/dL — ABNORMAL HIGH (ref 0.44–1.00)
GFR, Estimated: 51 mL/min — ABNORMAL LOW (ref >60)
Glucose, Bld: 136 mg/dL — ABNORMAL HIGH (ref 70–99)
Potassium: 3.4 mmol/L — ABNORMAL LOW (ref 3.5–5.1)
Sodium: 139 mmol/L (ref 135–145)

## 2021-05-13 MED ORDER — POLYETHYLENE GLYCOL 3350 17 G PO PACK
17.0000 g | PACK | Freq: Every day | ORAL | Status: DC
  Administered 2021-05-13 – 2021-05-21 (×5): 17 g via ORAL
  Filled 2021-05-13 (×6): qty 1

## 2021-05-13 MED ORDER — ASCORBIC ACID 500 MG PO TABS
500.0000 mg | ORAL_TABLET | Freq: Every day | ORAL | Status: DC
  Administered 2021-05-13 – 2021-05-19 (×7): 500 mg via ORAL
  Filled 2021-05-13 (×7): qty 1

## 2021-05-13 MED ORDER — DM-GUAIFENESIN ER 30-600 MG PO TB12
1.0000 | ORAL_TABLET | Freq: Two times a day (BID) | ORAL | Status: DC
  Administered 2021-05-13 – 2021-05-16 (×6): 1 via ORAL
  Filled 2021-05-13 (×6): qty 1

## 2021-05-13 MED ORDER — GUAIFENESIN-DM 100-10 MG/5ML PO SYRP
10.0000 mL | ORAL_SOLUTION | ORAL | Status: DC | PRN
  Administered 2021-05-13: 10 mL via ORAL
  Filled 2021-05-13: qty 10

## 2021-05-13 MED ORDER — CEPHALEXIN 500 MG PO CAPS
500.0000 mg | ORAL_CAPSULE | Freq: Two times a day (BID) | ORAL | Status: AC
Start: 2021-05-13 — End: 2021-05-14
  Administered 2021-05-13 – 2021-05-14 (×3): 500 mg via ORAL
  Filled 2021-05-13 (×3): qty 1

## 2021-05-13 MED ORDER — ZINC SULFATE 220 (50 ZN) MG PO CAPS
220.0000 mg | ORAL_CAPSULE | Freq: Every day | ORAL | Status: DC
  Administered 2021-05-13 – 2021-05-19 (×7): 220 mg via ORAL
  Filled 2021-05-13 (×7): qty 1

## 2021-05-13 MED ORDER — IPRATROPIUM-ALBUTEROL 20-100 MCG/ACT IN AERS
1.0000 | INHALATION_SPRAY | Freq: Four times a day (QID) | RESPIRATORY_TRACT | Status: DC
  Administered 2021-05-13 – 2021-05-18 (×12): 1 via RESPIRATORY_TRACT
  Filled 2021-05-13: qty 4

## 2021-05-13 NOTE — Progress Notes (Signed)
Physical Therapy Treatment Patient Details Name: Deborah [name] MRN: [id number] DOB: Jan 28, 1946 Today's Date: 05/13/2021   History of Present Illness 76 yo admitted 1/25 after found on bathroom floor by family. Pt with bifrontal and acute parafalcine SDH, pt denies fall. s/p 1/27 Onyx embolization of bilateral middle meningeal arteries PMHx: CHF, FTT, HOH, DM, HLD, HTN    PT Comments    Pt steadily improving.  Pt appears to have difficulty maintaining attention of task.  Needs cues for initiating and staying on task.  Also difficult to tell what is [facility] or decreased attention.  Emphasis on transfer safety and progression of gait stability, safety and stamina.  Pt still sounds congested, but VSS during gait trialon RA.    Recommendations for follow up therapy are one component of a multi-disciplinary discharge planning process, led by the attending physician.  Recommendations may be updated based on patient status, additional functional criteria and insurance authorization.  Follow Up Recommendations  Skilled nursing-short term rehab (<3 hours/day)     Assistance Recommended at Discharge Frequent or constant Supervision/Assistance  Patient can return home with the following A little help with walking and/or transfers;Help with stairs or ramp for entrance;Assistance with cooking/housework;Direct supervision/assist for financial management;Direct supervision/assist for medications management;Assist for transportation   Equipment Recommendations  Rolling walker (2 wheels);BSC/3in1    Recommendations for Other Services       Precautions / Restrictions Precautions Precautions: Fall     Mobility  Bed Mobility               General bed mobility comments: Pt up in chair    Transfers Overall transfer level: Needs assistance Equipment used: Rolling walker (2 wheels) Transfers: Sit to/from Stand Sit to Stand: Min assist           General transfer comment: stability assist and  minor forward assist    Ambulation/Gait Ambulation/Gait assistance: Min assist, Min guard Gait Distance (Feet): 260 Feet (with 4 standing rests, 2 initiated by pt.) Assistive device: Rolling walker (2 wheels) Gait Pattern/deviations: Decreased step length - right, Step-through pattern, Decreased step length - left, Decreased stride length Gait velocity: slower Gait velocity interpretation: <1.8 ft/sec, indicate of risk for recurrent falls   General Gait Details: cues for posture and proximity to the RW.  Stability assist, control for drift left with combo of cues and assist with RW   Stairs             Wheelchair Mobility    Modified Rankin (Stroke Patients Only) Modified Rankin (Stroke Patients Only) Modified Rankin: Moderately severe disability     Balance Overall balance assessment: Needs assistance, History of Falls   Sitting balance-Leahy Scale: Fair     Standing balance support: Bilateral upper extremity supported, Single extremity supported Standing balance-Leahy Scale: Poor Standing balance comment: still needing AD and or external stability assist                            Cognition Arousal/Alertness: Awake/alert Behavior During Therapy: Flat affect (very flat) Overall Cognitive Status: Impaired/Different from baseline                     Current Attention Level: Sustained, Selective   Following Commands: Follows one step commands consistently, Follows one step commands with increased time Safety/Judgement: Decreased awareness of deficits Awareness: Intellectual Problem Solving: Slow processing, Requires verbal cues General Comments: HOH makes it more difficult to discern present cognition  Exercises      General Comments General comments (skin integrity, edema, etc.): vss on RA during gait      Pertinent Vitals/Pain Pain Assessment Pain Assessment: Faces Faces Pain Scale: No hurt Pain Intervention(s): Monitored  during session    Home Living                          Prior Function            PT Goals (current goals can now be found in the care plan section) Acute Rehab PT Goals Patient Stated Goal: be independent PT Goal Formulation: With patient/family Time For Goal Achievement: 05/20/21 Potential to Achieve Goals: Good Progress towards PT goals: Progressing toward goals    Frequency    Min 3X/week      PT Plan Current plan remains appropriate    Co-evaluation              AM-PAC PT "6 Clicks" Mobility   Outcome Measure  Help needed turning from your back to your side while in a flat bed without using bedrails?: A Little Help needed moving from lying on your back to sitting on the side of a flat bed without using bedrails?: A Little Help needed moving to and from a bed to a chair (including a wheelchair)?: A Little Help needed standing up from a chair using your arms (e.g., wheelchair or bedside chair)?: A Little Help needed to walk in hospital room?: A Little Help needed climbing 3-5 steps with a railing? : A Lot 6 Click Score: 17    End of Session   Activity Tolerance: Patient tolerated treatment well Patient left: in chair;with call bell/phone within reach;with chair alarm set Nurse Communication: Mobility status PT Visit Diagnosis: Other abnormalities of gait and mobility (R26.89);Difficulty in walking, not elsewhere classified (R26.2);Muscle weakness (generalized) (M62.81)     Time: 9485-4627 PT Time Calculation (min) (ACUTE ONLY): 21 min  Charges:  $Gait Training: 8-22 mins                     05/13/2021  Jacinto Halim., PT Acute Rehabilitation Services [phone]  (pager) [phone]  (office)   Deborah [name] 05/13/2021, 2:22 PM

## 2021-05-13 NOTE — Progress Notes (Signed)
Remote ICD transmission.   

## 2021-05-13 NOTE — TOC Progression Note (Addendum)
Transition of Care [facility]) - Progression Note    Patient Details  Name: JONNY LONGINO MRN: [id number] Date of Birth: 07/26/1945  Transition of Care [facility]) CM/SW Contact  Jshon Ibe, LCSW Phone Number: 05/13/2021, 12:18 PM  Clinical Narrative:    HF CSW outreached the patient's son, [name] [phone] about SNF placement however he didn't answer the phone and CSW left a voicemail for him to return the call. HF CSW spoke with Mr. Rayvon Char wife Rhea Belton [phone] and she reported that they aren't happy with the SNF bed offers and would like her COVID precautions updated as they no longer have to wear PPE to go into the hospital room to see about getting other bed offers before they make a decision. CSW discussed about the insurance authorization and the sooner a choice can be made than that process can take place so that steps can be made to move forward. [name] asked for the CSW to outreach [facility] to see if they have any beds available for a weekend discharge. 3:59pm - HF CSW reached out to [facility] and had to leave a voicemail for her to return the call.  CSW will continue to follow throughout discharge.   Expected Discharge Plan: Skilled Nursing Facility Barriers to Discharge: Continued Medical Work up  Expected Discharge Plan and Services Expected Discharge Plan: Skilled Nursing Facility In-house Referral: Clinical Social Work Discharge Planning Services: CM Consult Post Acute Care Choice: Skilled Nursing Facility Living arrangements for the past 2 months: Apartment                                       Social Determinants of Health (SDOH) Interventions    Readmission Risk Interventions No flowsheet data found.

## 2021-05-13 NOTE — Progress Notes (Signed)
ANTICOAGULATION CONSULT NOTE - Follow Up Consult  Pharmacy Consult for Heparin + Warfarin Indication:  Mechanical heart valve  Allergies  Allergen Reactions   Strawberry Extract Rash    Rash & itching.    Patient Measurements: Height: 5\' 3"  (160 cm) Weight: 61.4 kg (135 lb 5.8 oz) IBW/kg (Calculated) : 52.4 Heparin Dosing Weight: 61.4 kg  Vital Signs: Temp: 99.8 F (37.7 C) (02/02 1145) Temp Source: Oral (02/02 1145) BP: 116/59 (02/02 1145) Pulse Rate: 80 (02/02 1145)  Labs: Recent Labs    05/11/21 0552 05/11/21 2014 05/12/21 0500 05/12/21 1304 05/13/21 0304 05/13/21 1254  HGB 8.6*  --  8.3*  --  7.9*  --   HCT 26.4*  --  25.8*  --  24.4*  --   PLT 258  --  252  --  246  --   LABPROT 15.3*  --  15.1  --  14.6  --   INR 1.2  --  1.2  --  1.1  --   HEPARINUNFRC 0.45   < > 0.18* 0.16* 0.20* 0.40  CREATININE 0.92  --  1.05*  --   --  1.12*   < > = values in this interval not displayed.    Estimated Creatinine Clearance: 34.8 mL/min (A) (by C-G formula based on SCr of 1.12 mg/dL (H)).   Assessment:  Anticoag: mechanical MVR  on Warfarin PTA (admit INR>10 on 7.5mg  daily except 5mg  on Mon/Fri) admitted with SDH - held, reversed with Kcentra + VitK 10 on 1/25. INR 1.1. Hgb down to 7.9. Hep level 0.4   -1/27 Successful Onyx embolization of B/L middle meningeal arteries >> repeat CTH stable - 1/31 CT: increased density of SDH > stopped heparin & reversed with protamine > restarted gtt after 4h (clot risk with mechanical valve outweighed bleed risk per neuro) - 2/1 Warfarin resumed.  Goal of Therapy:  Heparin level 0.3-0.5 units/ml INR  Monitor platelets by anticoagulation protocol: Yes   Plan:  Con't heparin at 1200 units/hr Coumadin 7.5mg  po x 1 again tonight Daily HL, CBC, INR Monitor hematuria with heparin gtt (targeting lower end of goal with SDH + hematuria)   Jehan Ranganathan S. Alford Highland, PharmD, BCPS Clinical Staff Pharmacist [email or web address] Alford Highland, [employer] 05/13/2021,2:17 PM

## 2021-05-13 NOTE — Progress Notes (Signed)
PROGRESS NOTE  Deborah [name] [id number] DOB: 1945-05-24 DOA: 05/05/2021 PCP: Merrilee Seashore, MD  HPI/Recap of past 24 hours: 76 year old female with prior history significant for HFrEF (4/22 EF 20-25%), AICD, HTN, congential MR s/p MVR [facility] [facility] on coumadin, HLD, DM, and anemia who presented to [facility] ER by EMS with altered mental status and weakness. Patient lives alone.  Her sister was unable to reach her, went to check on her and she was found on the bathroom floor.  EMS reports that she stated she had lost control of her bladder and sat on the floor to clean her self but was too weak to get up.  Denied falling, dizziness, syncope, etc.  Also reports not eating/ drinking for 16 hrs. Sister has noted a slow decline over the past several months.    Today, patient appeared sleepy, easily arousable. Noted to have nonproductive cough but does sound congested.  Reported feeling cold.  Oriented to self and date of birth, was unable to tell me where she was.    Assessment/Plan: Principal Problem:   Subdural hematoma without coma Active Problems:   Mitral valve disorder. (Status post mechanical valve replacement)   Chronic systolic CHF (congestive heart failure) (HCC)   AKI (acute kidney injury) (HCC)   Lactic acidosis   Warfarin-induced coagulopathy (HCC)   Failure to thrive in adult   Severe protein-calorie malnutrition (HCC)   Malnutrition of moderate degree   Subdural hematoma S/p MMA embolization on 1/28 Repeat CT head 1/30 and 1/31 showed stable subdural hematomas Neurosurgery consulted Seizure precautions with keppra  Continue PT/OT, currently recommendations for SNF Delirium precautions  COVID-19 positive Incidental finding on admission. On room air, although noted cough with congestion Isolation precautions Supportive care with inhaler, cough suppressant, vitamins   Combined systolic and diastolic HF Severe MR s/p mechanical MVR Appears euvolemic, on RA Echo on  07/22/2020 showed EF 20 to 25%, severely hypokinetic LV wall, moderately elevated PASP, stable MVR with mild regurg Strict intake and output, daily weight Telemetry  HTN/HLD BP stable  Continue carvedilol, hydralazine, and Imdur   Proteus mirabilis UTI Gross hematuria (ongoing microscopic for yrs due to warfarin) Improving S/P IV ceftriaxone--> PO Keflex to complete 5 days Monitor   Warfarin-induced coagulopathy INR reversed with Kcentra IV heparin to p.o. warfarin bridge Daily INR   AKI on CKD3B Improved Baseline creatinine around 1.3-1.4 Encourage oral hydration Daily BMP   Protein calorie malnutrition Functional decline Continue diet supplementation Registered dietitian consulted   Hyperglycemia A1c 5.4 Continue SSI, accuchecks, hypoglycemic protocol   GOC / Dispo Some concerns about patient's living situation and ability to care for self.  Son and daughter-in-law has agreed to take patient back to [place] to live with them after discharge Vs SNF placement Palliative consult for further [place] discussion (overall poor prognosis)    Malnutrition Type:  Nutrition Problem: Moderate Malnutrition Etiology: chronic illness (CHF)   Malnutrition Characteristics:  Signs/Symptoms: mild fat depletion, mild muscle depletion, moderate muscle depletion   Nutrition Interventions:  Interventions: Ensure Enlive (each supplement provides 350kcal and 20 grams of protein), MVI, Liberalize Diet    Estimated body mass index is 23.98 kg/m as calculated from the following:   Height as of this encounter: 5\' 3"  (1.6 m).   Weight as of this encounter: 61.4 kg.     Code Status: Full  Family Communication: None at bedside  Disposition Plan: Status is: Inpatient Remains inpatient appropriate because: Level of care     Consultants:  PCCM Neurosurgery Heart failure/cardiology   Procedures: MMA embolization on 1/28  Antimicrobials: P.o. Keflex  DVT prophylaxis: IV  Heparin, Coumadin   Objective: Vitals:   05/13/21 0003 05/13/21 0345 05/13/21 0736 05/13/21 1145  BP: (!) 109/53 117/63 (!) 126/91 (!) 116/59  Pulse: 72 81 (!) 101 80  Resp: 15 16 14 18   Temp: 98.4 F (36.9 C) 98.4 F (36.9 C) 98.5 F (36.9 C) 99.8 F (37.7 C)  TempSrc: Oral Oral Oral Oral  SpO2: 98% 96% 98% 100%  Weight:      Height:        Intake/Output Summary (Last 24 hours) at 05/13/2021 1328 Last data filed at 05/13/2021 1200 Gross per 24 hour  Intake 981.53 ml  Output 150 ml  Net 831.53 ml   Filed Weights   05/05/21 1241 05/07/21 0600  Weight: 67.9 kg 61.4 kg    Exam: General: NAD, sleepy, HOH Cardiovascular: S1, S2 present Respiratory: Diminished breath sounds bilaterally Abdomen: Soft, nontender, nondistended, bowel sounds present Musculoskeletal: No bilateral pedal edema noted Skin: Normal Psychiatry: Fair mood  Neurology: No obvious focal neurologic deficits noted, moving all extremities   Data Reviewed: CBC: Recent Labs  Lab 05/10/21 0140 05/10/21 1321 05/11/21 0552 05/12/21 0500 05/13/21 0304  WBC 10.2 9.5 11.1* 9.9 9.7  HGB 7.8* 8.0* 8.6* 8.3* 7.9*  HCT 25.1* 24.7* 26.4* 25.8* 24.4*  MCV 102.0* 99.6 98.9 100.4* 100.0  PLT 207 214 258 252 0000000   Basic Metabolic Panel: Recent Labs  Lab 05/07/21 0042 05/08/21 0122 05/09/21 0606 05/10/21 0140 05/10/21 1933 05/11/21 0552 05/12/21 0500  NA 138 141 138 138 137 136 138  K 3.8 3.9 4.0 4.3 4.1 3.9 3.6  CL 105 107 105 105 104 102 104  CO2 26 26 26 26 27 26 27   GLUCOSE 150* 107* 119* 159* 153* 155* 81  BUN 30* 19 18 23 19 17 13   CREATININE 1.13* 0.90 1.04* 1.40* 1.00 0.92 1.05*  CALCIUM 8.1* 8.0* 7.8* 8.1* 8.4* 8.5* 8.3*  MG 1.6* 2.2  --   --  1.8  --   --    GFR: Estimated Creatinine Clearance: 37.1 mL/min (A) (by C-G formula based on SCr of 1.05 mg/dL (H)). Liver Function Tests: No results for input(s): AST, ALT, ALKPHOS, BILITOT, PROT, ALBUMIN in the last 168 hours. No results for  input(s): LIPASE, AMYLASE in the last 168 hours. No results for input(s): AMMONIA in the last 168 hours. Coagulation Profile: Recent Labs  Lab 05/09/21 0606 05/10/21 0140 05/11/21 0552 05/12/21 0500 05/13/21 0304  INR 1.4* 1.3* 1.2 1.2 1.1   Cardiac Enzymes: No results for input(s): CKTOTAL, CKMB, CKMBINDEX, TROPONINI in the last 168 hours. BNP (last 3 results) No results for input(s): PROBNP in the last 8760 hours. HbA1C: No results for input(s): HGBA1C in the last 72 hours. CBG: Recent Labs  Lab 05/12/21 1128 05/12/21 1701 05/12/21 2113 05/13/21 0606 05/13/21 1144  GLUCAP 86 183* 154* 142* 157*   Lipid Profile: No results for input(s): CHOL, HDL, LDLCALC, TRIG, CHOLHDL, LDLDIRECT in the last 72 hours. Thyroid Function Tests: No results for input(s): TSH, T4TOTAL, FREET4, T3FREE, THYROIDAB in the last 72 hours. Anemia Panel: No results for input(s): VITAMINB12, FOLATE, FERRITIN, TIBC, IRON, RETICCTPCT in the last 72 hours. Urine analysis:    Component Value Date/Time   COLORURINE RED (A) 05/10/2021 1211   APPEARANCEUR TURBID (A) 05/10/2021 1211   LABSPEC  05/10/2021 1211    TEST NOT REPORTED DUE TO COLOR INTERFERENCE OF URINE  PIGMENT   PHURINE  05/10/2021 1211    TEST NOT REPORTED DUE TO COLOR INTERFERENCE OF URINE PIGMENT   GLUCOSEU (A) 05/10/2021 1211    TEST NOT REPORTED DUE TO COLOR INTERFERENCE OF URINE PIGMENT   HGBUR (A) 05/10/2021 1211    TEST NOT REPORTED DUE TO COLOR INTERFERENCE OF URINE PIGMENT   BILIRUBINUR (A) 05/10/2021 1211    TEST NOT REPORTED DUE TO COLOR INTERFERENCE OF URINE PIGMENT   KETONESUR (A) 05/10/2021 1211    TEST NOT REPORTED DUE TO COLOR INTERFERENCE OF URINE PIGMENT   PROTEINUR (A) 05/10/2021 1211    TEST NOT REPORTED DUE TO COLOR INTERFERENCE OF URINE PIGMENT   UROBILINOGEN 1.0 05/10/2011 1440   NITRITE (A) 05/10/2021 1211    TEST NOT REPORTED DUE TO COLOR INTERFERENCE OF URINE PIGMENT   LEUKOCYTESUR (A) 05/10/2021 1211    TEST  NOT REPORTED DUE TO COLOR INTERFERENCE OF URINE PIGMENT   Sepsis Labs: @LABRCNTIP (procalcitonin:4,lacticidven:4)  ) Recent Results (from the past 240 hour(s))  Blood Cultures (routine x 2)     Status: None   Collection Time: 05/05/21  1:00 PM   Specimen: BLOOD  Result Value Ref Range Status   Specimen Description BLOOD RIGHT ANTECUBITAL  Final   Special Requests   Final    BOTTLES DRAWN AEROBIC AND ANAEROBIC Blood Culture adequate volume   Culture   Final    NO GROWTH 5 DAYS Performed at [facility], [place] [address], [place], [place] [address]    Report Status 05/10/2021 FINAL  Final  Blood Cultures (routine x 2)     Status: None   Collection Time: 05/05/21  1:10 PM   Specimen: BLOOD RIGHT HAND  Result Value Ref Range Status   Specimen Description BLOOD RIGHT HAND  Final   Special Requests   Final    BOTTLES DRAWN AEROBIC AND ANAEROBIC Blood Culture results may not be optimal due to an inadequate volume of blood received in culture bottles   Culture   Final    NO GROWTH 5 DAYS Performed at [facility], [place] [address] [place], [place] [address]    Report Status 05/10/2021 FINAL  Final  Resp Panel by RT-PCR (Flu A&B, Covid) Nasopharyngeal Swab     Status: Abnormal   Collection Time: 05/05/21  5:06 PM   Specimen: Nasopharyngeal Swab; Nasopharyngeal(NP) swabs in vial transport medium  Result Value Ref Range Status   SARS Coronavirus 2 by RT PCR POSITIVE (A) NEGATIVE Final    Comment: (NOTE) SARS-CoV-2 target nucleic acids are DETECTED.  The SARS-CoV-2 RNA is generally detectable in upper respiratory specimens during the acute phase of infection. Positive results are indicative of the presence of the identified virus, but do not rule out bacterial infection or co-infection with other pathogens not detected by the test. Clinical correlation with patient history and other diagnostic information is necessary to determine patient infection status. The expected result  is Negative.  Fact Sheet for Patients: EntrepreneurPulse.com.au  Fact Sheet for Healthcare Providers: IncredibleEmployment.be  This test is not yet approved or cleared by the [place] FDA and  has been authorized for detection and/or diagnosis of SARS-CoV-2 by FDA under an Emergency Use Authorization (EUA).  This EUA will remain in effect (meaning this test can be used) for the duration of  the COVID-19 declaration under Section 564(b)(1) of the A ct, 21 U.S.C. section 360bbb-3(b)(1), unless the authorization is terminated or revoked sooner.     Influenza A by  PCR NEGATIVE NEGATIVE Final   Influenza B by PCR NEGATIVE NEGATIVE Final    Comment: (NOTE) The Xpert Xpress SARS-CoV-2/FLU/RSV plus assay is intended as an aid in the diagnosis of influenza from Nasopharyngeal swab specimens and should not be used as a sole basis for treatment. Nasal washings and aspirates are unacceptable for Xpert Xpress SARS-CoV-2/FLU/RSV testing.  Fact Sheet for Patients: EntrepreneurPulse.com.au  Fact Sheet for Healthcare Providers: IncredibleEmployment.be  This test is not yet approved or cleared by the [place] FDA and has been authorized for detection and/or diagnosis of SARS-CoV-2 by FDA under an Emergency Use Authorization (EUA). This EUA will remain in effect (meaning this test can be used) for the duration of the COVID-19 declaration under Section 564(b)(1) of the Act, 21 U.S.C. section 360bbb-3(b)(1), unless the authorization is terminated or revoked.  Performed at [facility], [place] [address] [place], [place] [address]   MRSA Next Gen by PCR, Nasal     Status: None   Collection Time: 05/05/21  6:40 PM   Specimen: Nasal Mucosa; Nasal Swab  Result Value Ref Range Status   MRSA by PCR Next Gen NOT DETECTED NOT DETECTED Final    Comment: (NOTE) The GeneXpert MRSA Assay (FDA approved for NASAL  specimens only), is one component of a comprehensive MRSA colonization surveillance program. It is not intended to diagnose MRSA infection nor to guide or monitor treatment for MRSA infections. Test performance is not FDA approved in patients less than 84 years old. Performed at [facility], [place] [address] [place], [place] [address]   Urine Culture     Status: Abnormal   Collection Time: 05/10/21  1:21 PM   Specimen: Urine, Catheterized  Result Value Ref Range Status   Specimen Description URINE, CATHETERIZED  Final   Special Requests   Final    NONE Performed at [facility], [place] [address], [place], [place] [address]    Culture >=100,000 COLONIES/mL PROTEUS MIRABILIS (A)  Final   Report Status 05/12/2021 FINAL  Final   Organism ID, Bacteria PROTEUS MIRABILIS (A)  Final      Susceptibility   Proteus mirabilis - MIC*    AMPICILLIN <=2 SENSITIVE Sensitive     CEFAZOLIN <=4 SENSITIVE Sensitive     CEFEPIME <=0.12 SENSITIVE Sensitive     CEFTRIAXONE <=0.25 SENSITIVE Sensitive     CIPROFLOXACIN <=0.25 SENSITIVE Sensitive     GENTAMICIN <=1 SENSITIVE Sensitive     IMIPENEM 1 SENSITIVE Sensitive     NITROFURANTOIN 128 RESISTANT Resistant     TRIMETH/SULFA <=20 SENSITIVE Sensitive     AMPICILLIN/SULBACTAM <=2 SENSITIVE Sensitive     PIP/TAZO <=4 SENSITIVE Sensitive     * >=100,000 COLONIES/mL PROTEUS MIRABILIS      Studies: No results found.  Scheduled Meds:  atorvastatin  40 mg Oral Daily   carvedilol  12.5 mg Oral BID WC   cephALEXin  500 mg Oral Q6H   Chlorhexidine Gluconate Cloth  6 each Topical Daily   feeding supplement  237 mL Oral TID BM   hydrALAZINE  25 mg Oral TID   insulin aspart  0-15 Units Subcutaneous TID WC   isosorbide mononitrate  30 mg Oral Daily   levETIRAcetam  500 mg Oral BID   sodium chloride flush  3 mL Intravenous Q12H   Warfarin - Pharmacist Dosing Inpatient   Does not apply q1600    Continuous Infusions:  heparin 1,200 Units/hr  (05/13/21 0600)     LOS:  8 days     Alma Friendly, MD [facility]  If 7PM-7AM, please contact night-coverage [email or web address] 05/13/2021, 1:28 PM

## 2021-05-13 NOTE — Progress Notes (Signed)
ANTICOAGULATION CONSULT NOTE   Pharmacy Consult for Heparin IV Indication:  Mechanical valve  Allergies  Allergen Reactions   Strawberry Extract Rash    Rash & itching.    Patient Measurements: Height: 5\' 3"  (160 cm) Weight: 61.4 kg (135 lb 5.8 oz) IBW/kg (Calculated) : 52.4 Heparin Dosing Weight: 61.4 kg  Vital Signs: Temp: 98.4 F (36.9 C) (02/02 0345) Temp Source: Oral (02/02 0345) BP: 117/63 (02/02 0345) Pulse Rate: 81 (02/02 0345)  Labs: Recent Labs    05/10/21 1933 05/10/21 2200 05/11/21 0552 05/11/21 2014 05/12/21 0500 05/12/21 1304 05/13/21 0304  HGB  --   --  8.6*  --  8.3*  --  7.9*  HCT  --   --  26.4*  --  25.8*  --  24.4*  PLT  --   --  258  --  252  --  246  LABPROT  --   --  15.3*  --  15.1  --  14.6  INR  --   --  1.2  --  1.2  --  1.1  HEPARINUNFRC  --    < > 0.45   < > 0.18* 0.16* 0.20*  CREATININE 1.00  --  0.92  --  1.05*  --   --    < > = values in this interval not displayed.     Estimated Creatinine Clearance: 37.1 mL/min (A) (by C-G formula based on SCr of 1.05 mg/dL (H)).   Assessment: 76 years of age female with history significant for congenital MR with [facility] mechanical valve 2005 on chronic warfarin who was admitted for AMS and weakness found to have INR >10 and bilateral SDH who received reversal of anticoagulation with Kcentra and Vitamin K IV on 1/25 and is now status post Onxy embolization of bilateral middle meningeal arteries on 1/27. Repeat CT Head is stable. Ok per Neurosurgery to resume anticoagulation with IV Heparin without boluses.   Plan per Neurosurgery - once patient is therapeutic on IV Heparin plan to repeat CT Head and if remains stable, then resume Warfarin.  1/30 CT head unchanged SDH along bilateral cerebral convexities   Heparin level 0.45 is therapeutic on 1050 units/hr and slightly trending up. Per RN, hematuria slightly improved from yesterday's bright red color to more tea-colored this morning. No  urological intervention planned at this time. Will decrease slightly to target low end of goal given SDH and hematuria.   1/31Afternoon update: Heparin infusion was stopped and reversed with protamine after patient became increasingly somnolent and STAT CT demonstrated increased density of SDH. Heparin infusion was restarted (no bolus) at 1500 per neurosurgery. Will hold off on starting warfarin until more stable.  2/2 AM update:  Heparin level remains low  Goal of Therapy:  Heparin level 0.3 to 0.5 units/ml (closer to ~0.3) Monitor platelets by anticoagulation protocol: Yes   Plan:  Inc heparin to 1200 units / hr 12 noon heparin level  Thank you Anette Guarneri, PharmD

## 2021-05-14 LAB — PROTIME-INR
INR: 1.3 — ABNORMAL HIGH (ref 0.8–1.2)
Prothrombin Time: 15.7 seconds — ABNORMAL HIGH (ref 11.4–15.2)

## 2021-05-14 LAB — CBC
HCT: 24.8 % — ABNORMAL LOW (ref 36.0–46.0)
Hemoglobin: 7.7 g/dL — ABNORMAL LOW (ref 12.0–15.0)
MCH: 31.4 pg (ref 26.0–34.0)
MCHC: 31 g/dL (ref 30.0–36.0)
MCV: 101.2 fL — ABNORMAL HIGH (ref 80.0–100.0)
Platelets: 240 10*3/uL (ref 150–400)
RBC: 2.45 MIL/uL — ABNORMAL LOW (ref 3.87–5.11)
RDW: 14.2 % (ref 11.5–15.5)
WBC: 9.5 10*3/uL (ref 4.0–10.5)
nRBC: 0 % (ref 0.0–0.2)

## 2021-05-14 LAB — BASIC METABOLIC PANEL
Anion gap: 7 (ref 5–15)
BUN: 18 mg/dL (ref 8–23)
CO2: 25 mmol/L (ref 22–32)
Calcium: 8.5 mg/dL — ABNORMAL LOW (ref 8.9–10.3)
Chloride: 106 mmol/L (ref 98–111)
Creatinine, Ser: 1.1 mg/dL — ABNORMAL HIGH (ref 0.44–1.00)
GFR, Estimated: 52 mL/min — ABNORMAL LOW (ref >60)
Glucose, Bld: 119 mg/dL — ABNORMAL HIGH (ref 70–99)
Potassium: 3.7 mmol/L (ref 3.5–5.1)
Sodium: 138 mmol/L (ref 135–145)

## 2021-05-14 LAB — TYPE AND SCREEN
ABO/RH(D): O POS
Antibody Screen: NEGATIVE

## 2021-05-14 LAB — HEPARIN LEVEL (UNFRACTIONATED): Heparin Unfractionated: 0.46 IU/mL (ref 0.30–0.70)

## 2021-05-14 LAB — GLUCOSE, CAPILLARY
Glucose-Capillary: 110 mg/dL — ABNORMAL HIGH (ref 70–99)
Glucose-Capillary: 149 mg/dL — ABNORMAL HIGH (ref 70–99)
Glucose-Capillary: 132 mg/dL — ABNORMAL HIGH (ref 70–99)
Glucose-Capillary: 124 mg/dL — ABNORMAL HIGH (ref 70–99)

## 2021-05-14 LAB — C-REACTIVE PROTEIN: CRP: 5.1 mg/dL — ABNORMAL HIGH (ref <1.0)

## 2021-05-14 MED ORDER — WARFARIN SODIUM 5 MG PO TABS
5.0000 mg | ORAL_TABLET | Freq: Once | ORAL | Status: AC
  Administered 2021-05-14: 5 mg via ORAL
  Filled 2021-05-14: qty 1

## 2021-05-14 NOTE — Plan of Care (Signed)

## 2021-05-14 NOTE — TOC Progression Note (Addendum)
Transition of Care [facility]) - Progression Note    Patient Details  Name: Deborah [name] MRN: [id number] Date of Birth: 23-Oct-1945  [facility] CM/SW Contact  Dorr Perrot, LCSW Phone Number: 05/14/2021, 9:18 AM  Clinical Narrative:    [name] reported [facility] will have a bed tomorrow for [name] and to let the family know they are in the midst of a COVID outbreak. HF CSW notified [name] about the bed available at [facility] as well as the current COVID outbreak. [name] reported that he plans to visit [facility] today at 10:30 and will provide an update afterwards. HF CSW reached out to [place] at [facility] to notify her about the family visiting and if she can take a look at [name] in the hub. [name] reported that they are reviewing her current notes and will make a final decision on a bed offer as they want to make sure her delirium has cleared. [facility] has a bed available tomorrow but they will not hold the bed very long and need to know a response from the family. Insurance authorization initiated on [facility] portal pending bed choice. Family needs to choose a facility for the insurance authorization decision to be made. Awaiting family choice and for [facility] to extend a bed offer if not [facility] if family is agreeable. 12:17pm - HF CSW received a call from [facility] and spoke with [facility] case#: [id number] and they are requesting a facility so that they can initiate a decision for the insurance authorization. The CSW notified [facility] that the family has not made a decision and will update them as soon as the family decides on a SNF. The patient's son, [name] is aware that a decision needs to be made as soon as possible.  CSW will continue to follow throughout discharge.   Expected Discharge Plan: Skilled Nursing Facility Barriers to Discharge: Continued Medical Work up  Expected Discharge Plan and Services Expected Discharge Plan: Skilled Nursing Facility In-house Referral: Clinical Social  Work Discharge Planning Services: CM Consult Post Acute Care Choice: Skilled Nursing Facility Living arrangements for the past 2 months: Apartment                                       Social Determinants of Health (SDOH) Interventions    Readmission Risk Interventions No flowsheet data found.  [name], MSW, LCSWA [phone] Heart Failure Social Worker

## 2021-05-14 NOTE — Progress Notes (Signed)
ANTICOAGULATION CONSULT NOTE - Follow Up Consult  Pharmacy Consult for Heparin + Warfarin Indication:  Mechanical heart valve  Allergies  Allergen Reactions   Strawberry Extract Rash    Rash & itching.    Patient Measurements: Height: 5\' 3"  (160 cm) Weight: 61.4 kg (135 lb 5.8 oz) IBW/kg (Calculated) : 52.4 Heparin Dosing Weight: 61.4 kg  Vital Signs: Temp: 97.5 F (36.4 C) (02/03 0345) Temp Source: Oral (02/03 0345) BP: 135/77 (02/03 0851) Pulse Rate: 83 (02/03 0851)  Labs: Recent Labs    05/12/21 0500 05/12/21 1304 05/13/21 0304 05/13/21 1254 05/14/21 0220  HGB 8.3*  --  7.9*  --  7.7*  HCT 25.8*  --  24.4*  --  24.8*  PLT 252  --  246  --  240  LABPROT 15.1  --  14.6  --  15.7*  INR 1.2  --  1.1  --  1.3*  HEPARINUNFRC 0.18*   < > 0.20* 0.40 0.46  CREATININE 1.05*  --   --  1.12* 1.10*   < > = values in this interval not displayed.     Estimated Creatinine Clearance: 35.4 mL/min (A) (by C-G formula based on SCr of 1.1 mg/dL (H)).   Assessment:  Anticoag: mechanical MVR  on Warfarin PTA (admit INR>10 on 7.5mg  daily except 5mg  on Mon/Fri) admitted with SDH - held, reversed with Kcentra + VitK 10 on 1/25.  - INR 1.3 up. Hgb down to 7.7. Hep level 0.46 (No warfarin last night. I must have forgotten to put in the Coumadin dose.)   -1/27 Successful Onyx embolization of B/L middle meningeal arteries >> repeat CTH stable - 1/31 CT: increased density of SDH > stopped heparin & reversed with protamine > restarted gtt after 4h (clot risk with mechanical valve outweighed bleed risk per neuro) - 2/1 Warfarin resumed.  Goal of Therapy:  Heparin level 0.3-0.5 units/ml INR  Monitor platelets by anticoagulation protocol: Yes   Plan:  Decrease heparin to 1150 units/hr so level does not keep trending up. Coumadin 5 MG PO X 1 tonight. Daily HL, CBC, INR Monitor hematuria with heparin gtt (targeting lower end of goal with SDH + hematuria)   Chrishelle Zito S. Alford Highland,  PharmD, BCPS Clinical Staff Pharmacist [email or web address] Alford Highland, Louann Hopson Stillinger 05/14/2021,8:55 AM

## 2021-05-14 NOTE — Progress Notes (Signed)
PROGRESS NOTE  Deborah [name] [id number] DOB: 03/29/1946 DOA: 05/05/2021 PCP: Merrilee Seashore, MD  HPI/Recap of past 43 hours: 76 year old female with prior history significant for HFrEF (4/22 EF 20-25%), AICD, HTN, congential MR s/p MVR [place] on coumadin, HLD, DM, and anemia who presented to [facility] ER by EMS with altered mental status and weakness. Patient lives alone.  Her sister was unable to reach her, went to check on her and she was found on the bathroom floor.  EMS reports that she stated she had lost control of her bladder and sat on the floor to clean her self but was too weak to get up.  Denied falling, dizziness, syncope, etc.  Also reports not eating/ drinking for 16 hrs. Sister has noted a slow decline over the past several months.    Today, patient denies any new complaints, hard of hearing, but also refuses to respond to questions asked.  Appeared comfortable.    Assessment/Plan: Principal Problem:   Subdural hematoma without coma Active Problems:   Mitral valve disorder. (Status post mechanical valve replacement)   Chronic systolic CHF (congestive heart failure) (HCC)   AKI (acute kidney injury) (HCC)   Lactic acidosis   Warfarin-induced coagulopathy (HCC)   Failure to thrive in adult   Severe protein-calorie malnutrition (HCC)   Malnutrition of moderate degree   Subdural hematoma S/p MMA embolization on 1/28 Repeat CT head 1/30 and 1/31 showed stable subdural hematomas Neurosurgery consulted Seizure precautions with keppra  Continue PT/OT, currently recommendations for SNF Delirium precautions  COVID-19 positive Incidental finding on admission. On room air, although noted cough with congestion Isolation precautions Supportive care with inhaler, cough suppressant, vitamins   Combined systolic and diastolic HF Severe MR s/p mechanical MVR Appears euvolemic, on RA Echo on 07/22/2020 showed EF 20 to 25%, severely hypokinetic LV wall, moderately  elevated PASP, stable MVR with mild regurg Strict intake and output, daily weight Telemetry  HTN/HLD BP stable  Continue carvedilol, hydralazine, and Imdur   Proteus mirabilis UTI Gross hematuria (ongoing microscopic for yrs due to warfarin) Improving S/P IV ceftriaxone--> PO Keflex to complete 5 days Monitor   Warfarin-induced coagulopathy INR reversed with Kcentra IV heparin to p.o. warfarin bridge Daily INR  Macrocytic anemia Baseline hemoglobin around 8-10, now on a downward trend On IV Heparin Anemia panel pending Type and screen done Daily CBC   AKI on CKD3B Improved Baseline creatinine around 1.3-1.4 Encourage oral hydration Daily BMP   Protein calorie malnutrition Functional decline Continue diet supplementation Registered dietitian consulted   Hyperglycemia A1c 5.4 Continue SSI, accuchecks, hypoglycemic protocol   GOC / Dispo Some concerns about patient's living situation and ability to care for self.  Son and daughter-in-law has agreed to take patient back to [place] to live with them after discharge Vs SNF placement Palliative consult for further [place] discussion (overall poor prognosis)       Malnutrition Type:  Nutrition Problem: Moderate Malnutrition Etiology: chronic illness (CHF)   Malnutrition Characteristics:  Signs/Symptoms: mild fat depletion, mild muscle depletion, moderate muscle depletion   Nutrition Interventions:  Interventions: Ensure Enlive (each supplement provides 350kcal and 20 grams of protein), MVI, Liberalize Diet    Estimated body mass index is 23.98 kg/m as calculated from the following:   Height as of this encounter: 5\' 3"  (1.6 m).   Weight as of this encounter: 61.4 kg.     Code Status: Full  Family Communication: None at bedside  Disposition Plan: Status is: Inpatient  Remains inpatient appropriate because: Level of care     Consultants: PCCM Neurosurgery Heart  failure/cardiology   Procedures: MMA embolization on 1/28  Antimicrobials: P.o. Keflex  DVT prophylaxis: IV Heparin, Coumadin   Objective: Vitals:   05/14/21 0851 05/14/21 1139 05/14/21 1556 05/14/21 1620  BP: 135/77 (!) 105/56 134/83   Pulse: 83 71 84   Resp: 16 14 14    Temp: 97.8 F (36.6 C) 98.4 F (36.9 C) 99.2 F (37.3 C) 98.4 F (36.9 C)  TempSrc: Oral Oral Axillary Oral  SpO2: 98% 95% 97%   Weight:      Height:        Intake/Output Summary (Last 24 hours) at 05/14/2021 1900 Last data filed at 05/14/2021 1800 Gross per 24 hour  Intake 586.16 ml  Output 2 ml  Net 584.16 ml   Filed Weights   05/05/21 1241 05/07/21 0600  Weight: 67.9 kg 61.4 kg    Exam: General: NAD, HOH Cardiovascular: S1, S2 present Respiratory: Diminished breath sounds bilaterally Abdomen: Soft, nontender, nondistended, bowel sounds present Musculoskeletal: No bilateral pedal edema noted Skin: Normal Psychiatry: Fair mood  Neurology: No obvious focal neurologic deficits noted, moving all extremities   Data Reviewed: CBC: Recent Labs  Lab 05/10/21 1321 05/11/21 0552 05/12/21 0500 05/13/21 0304 05/14/21 0220  WBC 9.5 11.1* 9.9 9.7 9.5  HGB 8.0* 8.6* 8.3* 7.9* 7.7*  HCT 24.7* 26.4* 25.8* 24.4* 24.8*  MCV 99.6 98.9 100.4* 100.0 101.2*  PLT 214 258 252 246 [id number]   Basic Metabolic Panel: Recent Labs  Lab 05/08/21 0122 05/09/21 0606 05/10/21 1933 05/11/21 0552 05/12/21 0500 05/13/21 1254 05/14/21 0220  NA 141   < > 137 136 138 139 138  K 3.9   < > 4.1 3.9 3.6 3.4* 3.7  CL 107   < > 104 102 104 104 106  CO2 26   < > 27 26 27 27 25   GLUCOSE 107*   < > 153* 155* 81 136* 119*  BUN 19   < > 19 17 13 17 18   CREATININE 0.90   < > 1.00 0.92 1.05* 1.12* 1.10*  CALCIUM 8.0*   < > 8.4* 8.5* 8.3* 8.6* 8.5*  MG 2.2  --  1.8  --   --   --   --    < > = values in this interval not displayed.   GFR: Estimated Creatinine Clearance: 35.4 mL/min (A) (by C-G formula based on SCr of 1.1  mg/dL (H)). Liver Function Tests: No results for input(s): AST, ALT, ALKPHOS, BILITOT, PROT, ALBUMIN in the last 168 hours. No results for input(s): LIPASE, AMYLASE in the last 168 hours. No results for input(s): AMMONIA in the last 168 hours. Coagulation Profile: Recent Labs  Lab 05/10/21 0140 05/11/21 0552 05/12/21 0500 05/13/21 0304 05/14/21 0220  INR 1.3* 1.2 1.2 1.1 1.3*   Cardiac Enzymes: No results for input(s): CKTOTAL, CKMB, CKMBINDEX, TROPONINI in the last 168 hours. BNP (last 3 results) No results for input(s): PROBNP in the last 8760 hours. HbA1C: No results for input(s): HGBA1C in the last 72 hours. CBG: Recent Labs  Lab 05/13/21 1604 05/13/21 2051 05/14/21 0619 05/14/21 1136 05/14/21 1552  GLUCAP 153* 130* 110* 149* 132*   Lipid Profile: No results for input(s): CHOL, HDL, LDLCALC, TRIG, CHOLHDL, LDLDIRECT in the last 72 hours. Thyroid Function Tests: No results for input(s): TSH, T4TOTAL, FREET4, T3FREE, THYROIDAB in the last 72 hours. Anemia Panel: No results for input(s): VITAMINB12, FOLATE, FERRITIN, TIBC, IRON, RETICCTPCT  in the last 72 hours. Urine analysis:    Component Value Date/Time   COLORURINE RED (A) 05/10/2021 1211   APPEARANCEUR TURBID (A) 05/10/2021 1211   LABSPEC  05/10/2021 1211    TEST NOT REPORTED DUE TO COLOR INTERFERENCE OF URINE PIGMENT   PHURINE  05/10/2021 1211    TEST NOT REPORTED DUE TO COLOR INTERFERENCE OF URINE PIGMENT   GLUCOSEU (A) 05/10/2021 1211    TEST NOT REPORTED DUE TO COLOR INTERFERENCE OF URINE PIGMENT   HGBUR (A) 05/10/2021 1211    TEST NOT REPORTED DUE TO COLOR INTERFERENCE OF URINE PIGMENT   BILIRUBINUR (A) 05/10/2021 1211    TEST NOT REPORTED DUE TO COLOR INTERFERENCE OF URINE PIGMENT   KETONESUR (A) 05/10/2021 1211    TEST NOT REPORTED DUE TO COLOR INTERFERENCE OF URINE PIGMENT   PROTEINUR (A) 05/10/2021 1211    TEST NOT REPORTED DUE TO COLOR INTERFERENCE OF URINE PIGMENT   UROBILINOGEN 1.0 05/10/2011  1440   NITRITE (A) 05/10/2021 1211    TEST NOT REPORTED DUE TO COLOR INTERFERENCE OF URINE PIGMENT   LEUKOCYTESUR (A) 05/10/2021 1211    TEST NOT REPORTED DUE TO COLOR INTERFERENCE OF URINE PIGMENT   Sepsis Labs: @LABRCNTIP (procalcitonin:4,lacticidven:4)  ) Recent Results (from the past 240 hour(s))  Blood Cultures (routine x 2)     Status: None   Collection Time: 05/05/21  1:00 PM   Specimen: BLOOD  Result Value Ref Range Status   Specimen Description BLOOD RIGHT ANTECUBITAL  Final   Special Requests   Final    BOTTLES DRAWN AEROBIC AND ANAEROBIC Blood Culture adequate volume   Culture   Final    NO GROWTH 5 DAYS Performed at [facility], [place] [address], [place], [place] [address]    Report Status 05/10/2021 FINAL  Final  Blood Cultures (routine x 2)     Status: None   Collection Time: 05/05/21  1:10 PM   Specimen: BLOOD RIGHT HAND  Result Value Ref Range Status   Specimen Description BLOOD RIGHT HAND  Final   Special Requests   Final    BOTTLES DRAWN AEROBIC AND ANAEROBIC Blood Culture results may not be optimal due to an inadequate volume of blood received in culture bottles   Culture   Final    NO GROWTH 5 DAYS Performed at [facility], [place] [address] [place], [place] [address]    Report Status 05/10/2021 FINAL  Final  Resp Panel by RT-PCR (Flu A&B, Covid) Nasopharyngeal Swab     Status: Abnormal   Collection Time: 05/05/21  5:06 PM   Specimen: Nasopharyngeal Swab; Nasopharyngeal(NP) swabs in vial transport medium  Result Value Ref Range Status   SARS Coronavirus 2 by RT PCR POSITIVE (A) NEGATIVE Final    Comment: (NOTE) SARS-CoV-2 target nucleic acids are DETECTED.  The SARS-CoV-2 RNA is generally detectable in upper respiratory specimens during the acute phase of infection. Positive results are indicative of the presence of the identified virus, but do not rule out bacterial infection or co-infection with other pathogens not detected by the test.  Clinical correlation with patient history and other diagnostic information is necessary to determine patient infection status. The expected result is Negative.  Fact Sheet for Patients: EntrepreneurPulse.com.au  Fact Sheet for Healthcare Providers: IncredibleEmployment.be  This test is not yet approved or cleared by the [place] FDA and  has been authorized for detection and/or diagnosis of SARS-CoV-2 by FDA under an Emergency Use Authorization (EUA).  This  EUA will remain in effect (meaning this test can be used) for the duration of  the COVID-19 declaration under Section 564(b)(1) of the A ct, 21 U.S.C. section 360bbb-3(b)(1), unless the authorization is terminated or revoked sooner.     Influenza A by PCR NEGATIVE NEGATIVE Final   Influenza B by PCR NEGATIVE NEGATIVE Final    Comment: (NOTE) The Xpert Xpress SARS-CoV-2/FLU/RSV plus assay is intended as an aid in the diagnosis of influenza from Nasopharyngeal swab specimens and should not be used as a sole basis for treatment. Nasal washings and aspirates are unacceptable for Xpert Xpress SARS-CoV-2/FLU/RSV testing.  Fact Sheet for Patients: EntrepreneurPulse.com.au  Fact Sheet for Healthcare Providers: IncredibleEmployment.be  This test is not yet approved or cleared by the [place] FDA and has been authorized for detection and/or diagnosis of SARS-CoV-2 by FDA under an Emergency Use Authorization (EUA). This EUA will remain in effect (meaning this test can be used) for the duration of the COVID-19 declaration under Section 564(b)(1) of the Act, 21 U.S.C. section 360bbb-3(b)(1), unless the authorization is terminated or revoked.  Performed at [facility], [place] [address], [place], [place] [address]   MRSA Next Gen by PCR, Nasal     Status: None   Collection Time: 05/05/21  6:40 PM   Specimen: Nasal Mucosa; Nasal Swab  Result Value  Ref Range Status   MRSA by PCR Next Gen NOT DETECTED NOT DETECTED Final    Comment: (NOTE) The GeneXpert MRSA Assay (FDA approved for NASAL specimens only), is one component of a comprehensive MRSA colonization surveillance program. It is not intended to diagnose MRSA infection nor to guide or monitor treatment for MRSA infections. Test performance is not FDA approved in patients less than 96 years old. Performed at [facility], [place] [address] [place], [place] [address]   Urine Culture     Status: Abnormal   Collection Time: 05/10/21  1:21 PM   Specimen: Urine, Catheterized  Result Value Ref Range Status   Specimen Description URINE, CATHETERIZED  Final   Special Requests   Final    NONE Performed at [facility], [place] [address] [place], [place] [address]    Culture >=100,000 COLONIES/mL PROTEUS MIRABILIS (A)  Final   Report Status 05/12/2021 FINAL  Final   Organism ID, Bacteria PROTEUS MIRABILIS (A)  Final      Susceptibility   Proteus mirabilis - MIC*    AMPICILLIN <=2 SENSITIVE Sensitive     CEFAZOLIN <=4 SENSITIVE Sensitive     CEFEPIME <=0.12 SENSITIVE Sensitive     CEFTRIAXONE <=0.25 SENSITIVE Sensitive     CIPROFLOXACIN <=0.25 SENSITIVE Sensitive     GENTAMICIN <=1 SENSITIVE Sensitive     IMIPENEM 1 SENSITIVE Sensitive     NITROFURANTOIN 128 RESISTANT Resistant     TRIMETH/SULFA <=20 SENSITIVE Sensitive     AMPICILLIN/SULBACTAM <=2 SENSITIVE Sensitive     PIP/TAZO <=4 SENSITIVE Sensitive     * >=100,000 COLONIES/mL PROTEUS MIRABILIS      Studies: No results found.  Scheduled Meds:  vitamin C  500 mg Oral Daily   atorvastatin  40 mg Oral Daily   carvedilol  12.5 mg Oral BID WC   cephALEXin  500 mg Oral Q12H   Chlorhexidine Gluconate Cloth  6 each Topical Daily   dextromethorphan-guaiFENesin  1 tablet Oral BID   feeding supplement  237 mL Oral TID BM   hydrALAZINE  25 mg Oral TID   insulin aspart  0-15 Units Subcutaneous TID WC    Ipratropium-Albuterol  1 puff Inhalation Q6H   isosorbide mononitrate  30 mg Oral Daily   levETIRAcetam  500 mg Oral BID   polyethylene glycol  17 g Oral Daily   sodium chloride flush  3 mL Intravenous Q12H   Warfarin - Pharmacist Dosing Inpatient   Does not apply q1600   zinc sulfate  220 mg Oral Daily    Continuous Infusions:  heparin 1,150 Units/hr (05/14/21 1800)     LOS: 9 days     Alma Friendly, MD [facility]  If 7PM-7AM, please contact night-coverage [email or web address] 05/14/2021, 7:00 PM

## 2021-05-14 NOTE — Progress Notes (Signed)
Occupational Therapy Treatment Patient Details Name: Deborah [name] MRN: [id number] DOB: June 04, 1945 Today's Date: 05/14/2021   History of present illness 76 yo admitted 1/25 after found on bathroom floor by family. Pt with bifrontal and acute parafalcine SDH, pt denies fall. s/p 1/27 [place]yx embolization of bilateral middle meningeal arteries PMHx: CHF, FTT, HOH, DM, HLD, HTN   OT comments  Session focused on attempts to progress standing balance and endurance for ADLs. However, pt somewhat self limiting, reporting "I don't feel good" but unable to specify symptoms. Despite using small volume level due to pt HOH, pt with inconsistent command following and answering of questions. Pt able to stand with min guard using RW and demo steps at bedside but declined to attempt further activities. Based on noted functioning below typical completely independent PLOF, continue to rec SNF rehab.   Recommendations for follow up therapy are one component of a multi-disciplinary discharge planning process, led by the attending physician.  Recommendations may be updated based on patient status, additional functional criteria and insurance authorization.    Follow Up Recommendations  Skilled nursing-short term rehab (<3 hours/day)    Assistance Recommended at Discharge Frequent or constant Supervision/Assistance  Patient can return home with the following  A little help with walking and/or transfers;A lot of help with bathing/dressing/bathroom;Assistance with cooking/housework;Direct supervision/assist for medications management;Direct supervision/assist for financial management;Assist for transportation;Help with stairs or ramp for entrance   Equipment Recommendations  Other (comment) (defer to next venue)    Recommendations for Other Services      Precautions / Restrictions Precautions Precautions: Fall Restrictions Weight Bearing Restrictions: No       Mobility Bed Mobility Overal bed mobility: Needs  Assistance Bed Mobility: Supine to Sit, Sit to Supine     Supine to sit: Mod assist Sit to supine: Min assist   General bed mobility comments: Mod A via handheld assist to lift trunk, increased time to scoot EOB. Min A to get BLE back into bed    Transfers Overall transfer level: Needs assistance Equipment used: Rolling walker (2 wheels) Transfers: Sit to/from Stand Sit to Stand: Min guard           General transfer comment: increased time to rise     Balance Overall balance assessment: Needs assistance, History of Falls Sitting-balance support: No upper extremity supported, Feet supported Sitting balance-Leahy Scale: Fair     Standing balance support: Bilateral upper extremity supported, Single extremity supported Standing balance-Leahy Scale: Poor                             ADL either performed or assessed with clinical judgement   ADL Overall ADL's : Needs assistance/impaired     Grooming: Set up;Sitting;Wash/dry hands Grooming Details (indicate cue type and reason): to remove sticky residue                               General ADL Comments: Pt limited by motivation vs cognition but improving physical abilities    Extremity/Trunk Assessment Upper Extremity Assessment Upper Extremity Assessment: Generalized weakness   Lower Extremity Assessment Lower Extremity Assessment: Defer to PT evaluation        Vision   Vision Assessment?: No apparent visual deficits   Perception     Praxis      Cognition Arousal/Alertness: Awake/alert Behavior During Therapy: Flat affect Overall Cognitive Status: Impaired/Different from baseline Area of  Impairment: Problem solving, Awareness, Following commands, Attention                   Current Attention Level: Selective   Following Commands: Follows one step commands with increased time, Follows one step commands inconsistently Safety/Judgement: Decreased awareness of  deficits Awareness: Intellectual Problem Solving: Slow processing, Requires verbal cues General Comments: Pt very flat. difficult to determine true cognitive impairment vs motivation vs HOH as pt inconsistently responding/following directions with same volume level from therapist        Exercises      Shoulder Instructions       General Comments      Pertinent Vitals/ Pain       Pain Assessment Pain Assessment: Faces Faces Pain Scale: No hurt Pain Intervention(s): Monitored during session  Home Living                                          Prior Functioning/Environment              Frequency  Min 2X/week        Progress Toward Goals  OT Goals(current goals can now be found in the care plan section)  Progress towards OT goals: Progressing toward goals  Acute Rehab OT Goals Patient Stated Goal: none stated OT Goal Formulation: With patient Time For Goal Achievement: 05/20/21 Potential to Achieve Goals: Good ADL Goals Pt Will Perform Grooming: with modified independence;standing Pt Will Perform Lower Body Bathing: with modified independence;sit to/from stand Pt Will Transfer to Toilet: with modified independence;ambulating Additional ADL Goal #1: Pt to participate in formal cognitive assessment Additional ADL Goal #2: Pt to verbalize at least 2 fall prevention strategies  Plan Discharge plan remains appropriate    Co-evaluation                 AM-PAC OT "6 Clicks" Daily Activity     Outcome Measure   Help from another person eating meals?: None Help from another person taking care of personal grooming?: A Little Help from another person toileting, which includes using toliet, bedpan, or urinal?: A Lot Help from another person bathing (including washing, rinsing, drying)?: A Lot Help from another person to put on and taking off regular upper body clothing?: A Little Help from another person to put on and taking off regular  lower body clothing?: A Lot 6 Click Score: 16    End of Session Equipment Utilized During Treatment: Gait belt;Rolling walker (2 wheels)  OT Visit Diagnosis: Unsteadiness on feet (R26.81);Other abnormalities of gait and mobility (R26.89);Muscle weakness (generalized) (M62.81);History of falling (Z91.81);Other symptoms and signs involving cognitive function   Activity Tolerance Patient tolerated treatment well   Patient Left in bed;with call bell/phone within reach;with bed alarm set   Nurse Communication Mobility status        Time: 1243-1310 OT Time Calculation (min): 27 min  Charges: OT General Charges $OT Visit: 1 Visit OT Treatments $Self Care/Home Management : 8-22 mins $Therapeutic Activity: 8-22 mins  Malachy Chamber, OTR/L Acute Rehab Services Office: [phone]   Layla Maw 05/14/2021, 1:29 PM

## 2021-05-15 LAB — CBC
HCT: 25.8 % — ABNORMAL LOW (ref 36.0–46.0)
HCT: 25.6 % — ABNORMAL LOW (ref 36.0–46.0)
Hemoglobin: 7.9 g/dL — ABNORMAL LOW (ref 12.0–15.0)
Hemoglobin: 8.1 g/dL — ABNORMAL LOW (ref 12.0–15.0)
MCH: 31.1 pg (ref 26.0–34.0)
MCH: 31.4 pg (ref 26.0–34.0)
MCHC: 30.6 g/dL (ref 30.0–36.0)
MCHC: 31.6 g/dL (ref 30.0–36.0)
MCV: 101.6 fL — ABNORMAL HIGH (ref 80.0–100.0)
MCV: 99.2 fL (ref 80.0–100.0)
Platelets: 277 10*3/uL (ref 150–400)
Platelets: 239 10*3/uL (ref 150–400)
RBC: 2.54 MIL/uL — ABNORMAL LOW (ref 3.87–5.11)
RBC: 2.58 MIL/uL — ABNORMAL LOW (ref 3.87–5.11)
RDW: 14.5 % (ref 11.5–15.5)
RDW: 14.5 % (ref 11.5–15.5)
WBC: 9.9 10*3/uL (ref 4.0–10.5)
WBC: 11.1 10*3/uL — ABNORMAL HIGH (ref 4.0–10.5)
nRBC: 0.3 % — ABNORMAL HIGH (ref 0.0–0.2)
nRBC: 0.2 % (ref 0.0–0.2)

## 2021-05-15 LAB — GLUCOSE, CAPILLARY
Glucose-Capillary: 105 mg/dL — ABNORMAL HIGH (ref 70–99)
Glucose-Capillary: 110 mg/dL — ABNORMAL HIGH (ref 70–99)
Glucose-Capillary: 191 mg/dL — ABNORMAL HIGH (ref 70–99)
Glucose-Capillary: 112 mg/dL — ABNORMAL HIGH (ref 70–99)

## 2021-05-15 LAB — BASIC METABOLIC PANEL
Anion gap: 10 (ref 5–15)
BUN: 15 mg/dL (ref 8–23)
CO2: 24 mmol/L (ref 22–32)
Calcium: 8.6 mg/dL — ABNORMAL LOW (ref 8.9–10.3)
Chloride: 104 mmol/L (ref 98–111)
Creatinine, Ser: 0.98 mg/dL (ref 0.44–1.00)
GFR, Estimated: 59 mL/min — ABNORMAL LOW (ref >60)
Glucose, Bld: 110 mg/dL — ABNORMAL HIGH (ref 70–99)
Potassium: 3.5 mmol/L (ref 3.5–5.1)
Sodium: 138 mmol/L (ref 135–145)

## 2021-05-15 LAB — HEPARIN LEVEL (UNFRACTIONATED): Heparin Unfractionated: 0.47 IU/mL (ref 0.30–0.70)

## 2021-05-15 LAB — IRON AND TIBC
Iron: 58 ug/dL (ref 28–170)
Saturation Ratios: 18 % (ref 10.4–31.8)
TIBC: 326 ug/dL (ref 250–450)
UIBC: 268 ug/dL

## 2021-05-15 LAB — PROTIME-INR
INR: 1.2 (ref 0.8–1.2)
Prothrombin Time: 15.4 seconds — ABNORMAL HIGH (ref 11.4–15.2)

## 2021-05-15 LAB — FOLATE: Folate: 17 ng/mL (ref >5.9)

## 2021-05-15 LAB — VITAMIN B12: Vitamin B-12: 691 pg/mL (ref 180–914)

## 2021-05-15 LAB — FERRITIN: Ferritin: 127 ng/mL (ref 11–307)

## 2021-05-15 MED ORDER — WARFARIN SODIUM 7.5 MG PO TABS
7.5000 mg | ORAL_TABLET | Freq: Once | ORAL | Status: AC
  Administered 2021-05-15: 7.5 mg via ORAL
  Filled 2021-05-15: qty 1

## 2021-05-15 NOTE — TOC Progression Note (Signed)
Transition of Care [facility]) - Initial/Assessment Note    Patient Details  Name: Deborah [name] MRN: [id number] Date of Birth: Sep 24, 1945  [facility] of [facility]) CM/SW Contact:    Ralene Bathe, LCSWA Phone Number: 05/15/2021, 1:09 PM  Clinical Narrative:                 CSW contacted the patient's son, Kem Boroughs, and presented bed offers. The son reported that he visited [facility] and would like for the patient to go to that facility.  The son reports that he was informed that the facility did not extend a bed offer yet, but that one may be extended next week.    CSW attempted to contact [place] with admissions at [facility] and is awaiting a returned call.    Expected Discharge Plan: Skilled Nursing Facility Barriers to Discharge: Continued Medical Work up   Patient Goals and CMS Choice Patient states their goals for this hospitalization and ongoing recovery are:: wants pt to receive rehab prior to [email or web address] Compare Post Acute Care list provided to:: Patient Represenative (must comment) (son, Kem Boroughs) Choice offered to / list presented to : Adult Children  Expected Discharge Plan and Services Expected Discharge Plan: Skilled Nursing Facility In-house Referral: Clinical Social Work Discharge Planning Services: CM Consult Post Acute Care Choice: Skilled Nursing Facility Living arrangements for the past 2 months: Apartment                                      Prior Living Arrangements/Services Living arrangements for the past 2 months: Apartment Lives with:: Self Patient language and need for interpreter reviewed:: Yes Do you feel safe going back to the place where you live?: No   will need assistance at home  Need for Family Participation in Patient Care: Yes (Comment) Care giver support system in place?: Yes (comment)   Criminal Activity/Legal Involvement Pertinent to Current Situation/Hospitalization: No - Comment as needed  Activities of  Daily Living      Permission Sought/Granted Permission sought to share information with : Case Manager, Family Supports, PCP Permission granted to share information with : Yes, Verbal Permission Granted  Share Information with NAME: Kem Boroughs  Permission granted to share info w AGENCY: SNF rehab  Permission granted to share info w Relationship: son  Permission granted to share info w Contact Information: [phone]  Emotional Assessment           Psych Involvement: No (comment)  Admission diagnosis:  Subdural hematoma without coma [S06.5XAA] Patient Active Problem List   Diagnosis Date Noted   Malnutrition of moderate degree 05/06/2021   Subdural hematoma without coma 05/05/2021   Failure to thrive in adult 05/05/2021   Severe protein-calorie malnutrition (HCC) 05/05/2021   Recurrent falls 05/05/2021   Warfarin-induced coagulopathy (HCC)    Acute on chronic anemia 07/21/2020   AKI (acute kidney injury) (HCC) 07/21/2020   Lactic acidosis 07/21/2020   Diabetes type 2, controlled (HCC)    Chronic systolic CHF (congestive heart failure) (HCC)    Pulmonary hypertension (HCC)    HLD (hyperlipidemia)    ICD-[facility] 08/02/2010   COPD, MILD 11/19/2009   Aortic valve disorder 07/30/2008   HYPERTENSION, BENIGN 01/24/2008   Mitral valve disorder. (Status post mechanical valve replacement) 01/24/2008   PCP:  Georgianne Fick, MD Pharmacy:   [facility] [phone] - [place], [place] - [phone]  [address] [place] [address] Phone: [phone] Fax: [phone]     Social Determinants of Health (SDOH) Interventions    Readmission Risk Interventions No flowsheet data found.

## 2021-05-15 NOTE — Plan of Care (Signed)
°  Problem: Education: Goal: Knowledge of General Education information will improve Description: Including pain rating scale, medication(s)/side effects and non-pharmacologic comfort measures Outcome: Not Progressing   Problem: Health Behavior/Discharge Planning: Goal: Ability to manage health-related needs will improve Outcome: Not Progressing   Problem: Clinical Measurements: Goal: Ability to maintain clinical measurements within normal limits will improve Outcome: Not Progressing Goal: Will remain free from infection Outcome: Progressing Goal: Diagnostic test results will improve Outcome: Not Progressing Goal: Respiratory complications will improve Outcome: Progressing Goal: Cardiovascular complication will be avoided Outcome: Progressing   Problem: Activity: Goal: Risk for activity intolerance will decrease Outcome: Not Progressing   Problem: Nutrition: Goal: Adequate nutrition will be maintained Outcome: Not Progressing   Problem: Coping: Goal: Level of anxiety will decrease Outcome: Progressing   Problem: Elimination: Goal: Will not experience complications related to bowel motility Outcome: Progressing Goal: Will not experience complications related to urinary retention Outcome: Progressing   Problem: Pain Managment: Goal: General experience of comfort will improve Outcome: Progressing   Problem: Safety: Goal: Ability to remain free from injury will improve Outcome: Progressing   Problem: Skin Integrity: Goal: Risk for impaired skin integrity will decrease Outcome: Progressing

## 2021-05-15 NOTE — Progress Notes (Addendum)
PROGRESS NOTE  Deborah [name] XFG:[id number] DOB: 07/16/1945 DOA: 05/05/2021 PCP: Merrilee Seashore, MD  HPI/Recap of past 21 hours: 76 year old female with prior history significant for HFrEF (4/22 EF 20-25%), AICD, HTN, congential MR s/p MVR [facility] [facility] on coumadin, HLD, DM, and anemia who presented to [facility] ER by EMS with altered mental status and weakness. Patient lives alone.  Her sister was unable to reach her, went to check on her and she was found on the bathroom floor.  EMS reports that she stated she had lost control of her bladder and sat on the floor to clean her self but was too weak to get up.  Denied falling, dizziness, syncope, etc.  Also reports not eating/ drinking for 16 hrs. Sister has noted a slow decline over the past several months.    Today, met patient sitting up in bed, refusing to eat, but was more communicative.  Still appears overall lethargic, and very deconditioned.  Noted to have recurrent/persistent hematuria.  Patient has a mechanical heart valve and will be monitored very closely while on Coumadin and bridging with IV heparin.    Assessment/Plan: Principal Problem:   Subdural hematoma without coma Active Problems:   Mitral valve disorder. (Status post mechanical valve replacement)   Chronic systolic CHF (congestive heart failure) (HCC)   AKI (acute kidney injury) (HCC)   Lactic acidosis   Warfarin-induced coagulopathy (HCC)   Failure to thrive in adult   Severe protein-calorie malnutrition (HCC)   Malnutrition of moderate degree   Subdural hematoma S/p MMA embolization on 1/28 Repeat CT head 1/30 and 1/31 showed stable subdural hematomas Neurosurgery consulted Seizure precautions with keppra  Continue PT/OT, currently recommendations for SNF Delirium precautions  COVID-19 positive Incidental finding on admission. On room air, although noted cough with congestion Isolation precautions Supportive care with inhaler, cough suppressant, vitamins    Combined systolic and diastolic HF Severe MR s/p mechanical MVR (mechanical heart valve) Appears euvolemic, on RA Echo on 07/22/2020 showed EF 20 to 25%, severely hypokinetic LV wall, moderately elevated PASP, stable MVR with mild regurg Strict intake and output, daily weight INR goal 2.5-3.5 Telemetry  HTN/HLD BP stable  Continue carvedilol, hydralazine, and Imdur   Proteus mirabilis UTI Gross hematuria (ongoing microscopic for yrs due to warfarin) Improving S/P IV ceftriaxone--> PO Keflex, completed 5 days on 05/15/21 Monitor   Warfarin-induced coagulopathy INR reversed with Kcentra IV heparin to p.o. warfarin bridge Daily INR (goal 2.5-3.5)  Macrocytic anemia Baseline hemoglobin around 8-10, now on a downward trend Noted recurrent/persistent hematuria while on anticoagulation On IV Heparin Anemia panel WNL Type and screen done Frequent CBC   AKI on CKD3B Improved Baseline creatinine around 1.3-1.4 Encourage oral hydration Daily BMP   Protein calorie malnutrition Functional decline Continue diet supplementation Registered dietitian consulted   Hyperglycemia A1c 5.4 Continue SSI, accuchecks, hypoglycemic protocol   GOC / Dispo Some concerns about patient's living situation and ability to care for self Plan for SNF placement Palliative consult for further [place] discussion (overall poor prognosis) Pt made DNR on 05/15/21 after discussion with son       Malnutrition Type:  Nutrition Problem: Moderate Malnutrition Etiology: chronic illness (CHF)   Malnutrition Characteristics:  Signs/Symptoms: mild fat depletion, mild muscle depletion, moderate muscle depletion   Nutrition Interventions:  Interventions: Ensure Enlive (each supplement provides 350kcal and 20 grams of protein), MVI, Liberalize Diet    Estimated body mass index is 23.98 kg/m as calculated from the following:   Height  as of this encounter: _0  (1.6 m).   Weight as of this encounter:  61.4 kg.     Code Status: DNR  Family Communication: Discussed with son on 05/15/21 about overall prognosis, wants patient to be a DNR  Disposition Plan: Status is: Inpatient Remains inpatient appropriate because: Level of care     Consultants: PCCM Neurosurgery Heart failure/cardiology   Procedures: MMA embolization on 1/28  Antimicrobials: Completed  p.o. Keflex  DVT prophylaxis: IV Heparin, Coumadin   Objective: Vitals:   05/15/21 0300 05/15/21 0840 05/15/21 1221 05/15/21 1708  BP: 121/77 (!) 157/85 122/78 122/85  Pulse: 85 87 80 87  Resp: _1 Temp: 98.5 F (36.9 C) 97.6 F (36.4 C) 99.5 F (37.5 C) 98.8 F (37.1 C)  TempSrc: Oral Oral Oral Oral  SpO2: 95% 99% 99% 100%  Weight:      Height:        Intake/Output Summary (Last 24 hours) at 05/15/2021 1848 Last data filed at 05/15/2021 0300 Gross per 24 hour  Intake 102.21 ml  Output 1 ml  Net 101.21 ml   Filed Weights   05/05/21 1241 05/07/21 0600  Weight: 67.9 kg 61.4 kg    Exam: General: NAD, HOH Cardiovascular: S1, S2 present Respiratory: Diminished breath sounds bilaterally Abdomen: Soft, nontender, nondistended, bowel sounds present Musculoskeletal: No bilateral pedal edema noted Skin: Normal Psychiatry: Fair mood  Neurology: No obvious focal neurologic deficits noted, moving all extremities   Data Reviewed: CBC: Recent Labs  Lab 05/12/21 0500 05/13/21 0304 05/14/21 0220 05/15/21 0323 05/15/21 1436  WBC 9.9 9.7 9.5 9.9 11.1*  HGB 8.3* 7.9* 7.7* 7.9* 8.1*  HCT 25.8* 24.4* 24.8* 25.8* 25.6*  MCV 100.4* 100.0 101.2* 101.6* 99.2  PLT 252 246 240 277 761   Basic Metabolic Panel: Recent Labs  Lab 05/10/21 1933 05/11/21 0552 05/12/21 0500 05/13/21 1254 05/14/21 0220 05/15/21 0323  NA 137 136 138 139 138 138  K 4.1 3.9 3.6 3.4* 3.7 3.5  CL 104 102 104 104 106 104  CO2 _2 GLUCOSE 153* 155* 81 136* 119* 110*  BUN _3 CREATININE 1.00 0.92  1.05* 1.12* 1.10* 0.98  CALCIUM 8.4* 8.5* 8.3* 8.6* 8.5* 8.6*  MG 1.8  --   --   --   --   --    GFR: Estimated Creatinine Clearance: 39.8 mL/min (by C-G formula based on SCr of 0.98 mg/dL). Liver Function Tests: No results for input(s): AST, ALT, ALKPHOS, BILITOT, PROT, ALBUMIN in the last 168 hours. No results for input(s): LIPASE, AMYLASE in the last 168 hours. No results for input(s): AMMONIA in the last 168 hours. Coagulation Profile: Recent Labs  Lab 05/11/21 0552 05/12/21 0500 05/13/21 0304 05/14/21 0220 05/15/21 0323  INR 1.2 1.2 1.1 1.3* 1.2   Cardiac Enzymes: No results for input(s): CKTOTAL, CKMB, CKMBINDEX, TROPONINI in the last 168 hours. BNP (last 3 results) No results for input(s): PROBNP in the last 8760 hours. HbA1C: No results for input(s): HGBA1C in the last 72 hours. CBG: Recent Labs  Lab 05/14/21 1552 05/14/21 2107 05/15/21 0631 05/15/21 1227 05/15/21 1711  GLUCAP 132* 124* 105* 110* 191*   Lipid Profile: No results for input(s): CHOL, HDL, LDLCALC, TRIG, CHOLHDL, LDLDIRECT in the last 72 hours. Thyroid Function Tests: No results for input(s): TSH, T4TOTAL, FREET4, T3FREE, THYROIDAB in the last 72 hours. Anemia Panel: Recent Labs    05/15/21 0323  [id number]  691  FOLATE 17.0  FERRITIN 127  TIBC 326  IRON 58   Urine analysis:    Component Value Date/Time   COLORURINE RED (A) 05/10/2021 1211   APPEARANCEUR TURBID (A) 05/10/2021 1211   LABSPEC  05/10/2021 1211    TEST NOT REPORTED DUE TO COLOR INTERFERENCE OF URINE PIGMENT   PHURINE  05/10/2021 1211    TEST NOT REPORTED DUE TO COLOR INTERFERENCE OF URINE PIGMENT   GLUCOSEU (A) 05/10/2021 1211    TEST NOT REPORTED DUE TO COLOR INTERFERENCE OF URINE PIGMENT   HGBUR (A) 05/10/2021 1211    TEST NOT REPORTED DUE TO COLOR INTERFERENCE OF URINE PIGMENT   BILIRUBINUR (A) 05/10/2021 1211    TEST NOT REPORTED DUE TO COLOR INTERFERENCE OF URINE PIGMENT   KETONESUR (A) 05/10/2021 1211    TEST  NOT REPORTED DUE TO COLOR INTERFERENCE OF URINE PIGMENT   PROTEINUR (A) 05/10/2021 1211    TEST NOT REPORTED DUE TO COLOR INTERFERENCE OF URINE PIGMENT   UROBILINOGEN 1.0 05/10/2011 1440   NITRITE (A) 05/10/2021 1211    TEST NOT REPORTED DUE TO COLOR INTERFERENCE OF URINE PIGMENT   LEUKOCYTESUR (A) 05/10/2021 1211    TEST NOT REPORTED DUE TO COLOR INTERFERENCE OF URINE PIGMENT   Sepsis Labs: _0 (procalcitonin:4,lacticidven:4)  ) Recent Results (from the past 240 hour(s))  Urine Culture     Status: Abnormal   Collection Time: 05/10/21  1:21 PM   Specimen: Urine, Catheterized  Result Value Ref Range Status   Specimen Description URINE, CATHETERIZED  Final   Special Requests   Final    NONE Performed at [facility], [place] [address], [place], [place] [address]    Culture >=100,000 COLONIES/mL PROTEUS MIRABILIS (A)  Final   Report Status 05/12/2021 FINAL  Final   Organism ID, Bacteria PROTEUS MIRABILIS (A)  Final      Susceptibility   Proteus mirabilis - MIC*    AMPICILLIN <=2 SENSITIVE Sensitive     CEFAZOLIN <=4 SENSITIVE Sensitive     CEFEPIME <=0.12 SENSITIVE Sensitive     CEFTRIAXONE <=0.25 SENSITIVE Sensitive     CIPROFLOXACIN <=0.25 SENSITIVE Sensitive     GENTAMICIN <=1 SENSITIVE Sensitive     IMIPENEM 1 SENSITIVE Sensitive     NITROFURANTOIN 128 RESISTANT Resistant     TRIMETH/SULFA <=20 SENSITIVE Sensitive     AMPICILLIN/SULBACTAM <=2 SENSITIVE Sensitive     PIP/TAZO <=4 SENSITIVE Sensitive     * >=100,000 COLONIES/mL PROTEUS MIRABILIS      Studies: No results found.  Scheduled Meds:  vitamin C  500 mg Oral Daily   atorvastatin  40 mg Oral Daily   carvedilol  12.5 mg Oral BID WC   Chlorhexidine Gluconate Cloth  6 each Topical Daily   dextromethorphan-guaiFENesin  1 tablet Oral BID   feeding supplement  237 mL Oral TID BM   hydrALAZINE  25 mg Oral TID   insulin aspart  0-15 Units Subcutaneous TID WC   Ipratropium-Albuterol  1 puff Inhalation Q6H    isosorbide mononitrate  30 mg Oral Daily   levETIRAcetam  500 mg Oral BID   polyethylene glycol  17 g Oral Daily   sodium chloride flush  3 mL Intravenous Q12H   Warfarin - Pharmacist Dosing Inpatient   Does not apply q1600   zinc sulfate  220 mg Oral Daily    Continuous Infusions:  heparin 1,100 Units/hr (05/15/21 0855)     LOS: 10 days     Adline Peals  Horris Latino, MD [facility]  If 7PM-7AM, please contact night-coverage [email or web address] 05/15/2021, 6:48 PM

## 2021-05-15 NOTE — Progress Notes (Signed)
Patient awake and alert with prompting but not answering orientation questions or following directions, unable to complete an adequate assessment at this time.

## 2021-05-15 NOTE — Progress Notes (Addendum)
RN told on report this AM of pt having blood in her urine for a few days. She is on continuous heparin drip going at 11 mL/hr. Pt just used BSC and RN noted urine to be a bright red color. Pt has also been drowsy and lethargic this morning, Hgl. 7.9. RN sent secure chat to MD, awaiting any new orders. Family at bedside.  Pharmacy will also be contacted.

## 2021-05-15 NOTE — Progress Notes (Signed)
ANTICOAGULATION CONSULT NOTE - Follow Up Consult  Pharmacy Consult for Heparin + Warfarin Indication:  mechanical heart valve  Allergies  Allergen Reactions   Strawberry Extract Rash    Rash & itching.   Patient Measurements: Height: 5\' 3"  (160 cm) Weight: 61.4 kg (135 lb 5.8 oz) IBW/kg (Calculated) : 52.4 kg Heparin Dosing Weight: 61.4 kg  Vital Signs: Temp: 98.5 F (36.9 C) (02/04 0300) Temp Source: Oral (02/04 0300) BP: 121/77 (02/04 0300) Pulse Rate: 85 (02/04 0300)  Labs: Recent Labs    05/13/21 0304 05/13/21 1254 05/14/21 0220 05/15/21 0323  HGB 7.9*  --  7.7* 7.9*  HCT 24.4*  --  24.8* 25.8*  PLT 246  --  240 277  LABPROT 14.6  --  15.7* 15.4*  INR 1.1  --  1.3* 1.2  HEPARINUNFRC 0.20* 0.40 0.46  --   CREATININE  --  1.12* 1.10* 0.98     Estimated Creatinine Clearance: 39.8 mL/min (by C-G formula based on SCr of 0.98 mg/dL).   Assessment: 76 yo female with a history of HFrEF, AICD, HTN, congential MR s/p [facility] MVR, and anemia presents with altered mental status and weakness. PTA the patient is on warfarin. INR upon admission was >10 and patient found to have SDH. Pharmacy is consulted to dose heparin and warfarin.   Warfarin Home Dose: 7.5 mg daily except 5 mg every Mon/Fri (per 03/25/21 visit)  -1/25: Warfarin held, [facility] 2115 units IV & vitamin K 10 mg IVPB given -1/27: Successful Onyx embolization of B/L middle meningeal arteries >> repeat CTH stable -1/31: CT shows increased density of SDH > stopped heparin & reversed with protamine > restarted gtt after 4h (clot risk with mechanical valve outweighed bleed risk per neuro) -2/1: Warfarin resumed (none given on 2/2)  INR subtherapeutic at 1.2 today. Heparin level is therapeutic at 0.47 while heparin infusion is running at 1150 units/hr. In light of SDH, will decrease the heparin infusion rate slightly to target a lower heparin level goal. Hgb 7.9, platelets 277. Per the RN, there have been no issues  with the heparin infusion and the patient's hematuria remains unchanged. There have been no significant diet changes or new interacting medications.    Goal of Therapy:  Heparin level 0.3-0.5 units/ml INR 2.5-3.5 Monitor platelets by anticoagulation protocol: Yes   Plan:  Decrease heparin IV to 1100 units/hr Give warfarin 7.5 mg PO x 1 dose tonight Obtain a daily heparin level, PT/INR, and CBC Monitor for signs and symptoms of bleeding Watch for significant diet changes and drug-drug interactions  Shauna Hugh, PharmD, [place]  PGY-2 Pharmacy Resident 05/15/2021 7:39 AM  Please check [email or web address] for unit-specific pharmacy phone numbers.

## 2021-05-16 ENCOUNTER — Inpatient Hospital Stay (HOSPITAL_COMMUNITY): Payer: Medicare Other

## 2021-05-16 LAB — URINALYSIS, ROUTINE W REFLEX MICROSCOPIC
Glucose, UA: NEGATIVE mg/dL
Ketones, ur: NEGATIVE mg/dL
Leukocytes,Ua: NEGATIVE
Nitrite: NEGATIVE
Protein, ur: >300 mg/dL — AB
Specific Gravity, Urine: 1.025 (ref 1.005–1.030)
pH: 5.5 (ref 5.0–8.0)

## 2021-05-16 LAB — URINALYSIS, MICROSCOPIC (REFLEX)
Bacteria, UA: NONE SEEN
RBC / HPF: >50 RBC/hpf (ref 0–5)

## 2021-05-16 LAB — HEPARIN LEVEL (UNFRACTIONATED): Heparin Unfractionated: 0.33 IU/mL (ref 0.30–0.70)

## 2021-05-16 LAB — BASIC METABOLIC PANEL
Anion gap: 10 (ref 5–15)
BUN: 14 mg/dL (ref 8–23)
CO2: 25 mmol/L (ref 22–32)
Calcium: 8.6 mg/dL — ABNORMAL LOW (ref 8.9–10.3)
Chloride: 104 mmol/L (ref 98–111)
Creatinine, Ser: 1.01 mg/dL — ABNORMAL HIGH (ref 0.44–1.00)
GFR, Estimated: 57 mL/min — ABNORMAL LOW (ref >60)
Glucose, Bld: 103 mg/dL — ABNORMAL HIGH (ref 70–99)
Potassium: 3.4 mmol/L — ABNORMAL LOW (ref 3.5–5.1)
Sodium: 139 mmol/L (ref 135–145)

## 2021-05-16 LAB — MAGNESIUM: Magnesium: 1.5 mg/dL — ABNORMAL LOW (ref 1.7–2.4)

## 2021-05-16 LAB — CBC
HCT: 24.6 % — ABNORMAL LOW (ref 36.0–46.0)
Hemoglobin: 7.9 g/dL — ABNORMAL LOW (ref 12.0–15.0)
MCH: 32.6 pg (ref 26.0–34.0)
MCHC: 32.1 g/dL (ref 30.0–36.0)
MCV: 101.7 fL — ABNORMAL HIGH (ref 80.0–100.0)
Platelets: 330 10*3/uL (ref 150–400)
RBC: 2.42 MIL/uL — ABNORMAL LOW (ref 3.87–5.11)
RDW: 14.6 % (ref 11.5–15.5)
WBC: 13.6 10*3/uL — ABNORMAL HIGH (ref 4.0–10.5)
nRBC: 0.1 % (ref 0.0–0.2)

## 2021-05-16 LAB — PROTIME-INR
INR: 1.4 — ABNORMAL HIGH (ref 0.8–1.2)
Prothrombin Time: 16.8 seconds — ABNORMAL HIGH (ref 11.4–15.2)

## 2021-05-16 LAB — GLUCOSE, CAPILLARY
Glucose-Capillary: 104 mg/dL — ABNORMAL HIGH (ref 70–99)
Glucose-Capillary: 156 mg/dL — ABNORMAL HIGH (ref 70–99)
Glucose-Capillary: 205 mg/dL — ABNORMAL HIGH (ref 70–99)

## 2021-05-16 MED ORDER — MAGNESIUM SULFATE 2 GM/50ML IV SOLN
2.0000 g | Freq: Once | INTRAVENOUS | Status: AC
Start: 2021-05-16 — End: 2021-05-17
  Administered 2021-05-16: 2 g via INTRAVENOUS
  Filled 2021-05-16: qty 50

## 2021-05-16 MED ORDER — WARFARIN SODIUM 7.5 MG PO TABS
7.5000 mg | ORAL_TABLET | Freq: Once | ORAL | Status: AC
  Administered 2021-05-16: 7.5 mg via ORAL
  Filled 2021-05-16: qty 1

## 2021-05-16 MED ORDER — DM-GUAIFENESIN ER 30-600 MG PO TB12: 1.0000 | ORAL_TABLET | Freq: Two times a day (BID) | ORAL | Status: DC | PRN

## 2021-05-16 MED ORDER — POTASSIUM CHLORIDE CRYS ER 20 MEQ PO TBCR
40.0000 meq | EXTENDED_RELEASE_TABLET | Freq: Once | ORAL | Status: AC
  Administered 2021-05-16: 40 meq via ORAL
  Filled 2021-05-16: qty 2

## 2021-05-16 MED ORDER — SODIUM CHLORIDE 0.9 % IV SOLN
1.0000 g | INTRAVENOUS | Status: DC
  Administered 2021-05-16: 1 g via INTRAVENOUS
  Filled 2021-05-16: qty 10

## 2021-05-16 NOTE — Progress Notes (Signed)
ANTICOAGULATION CONSULT NOTE - Follow Up Consult  Pharmacy Consult for Heparin + Warfarin Indication:  mechanical heart valve  Allergies  Allergen Reactions   Strawberry Extract Rash    Rash & itching.   Patient Measurements: Height: 5\' 3"  (160 cm) Weight: 61.4 kg (135 lb 5.8 oz) IBW/kg (Calculated) : 52.4 kg Heparin Dosing Weight: 61.4 kg  Vital Signs: Temp: 99.4 F (37.4 C) (02/05 0312) Temp Source: Oral (02/05 0312) BP: 134/78 (02/05 0312) Pulse Rate: 74 (02/05 0312)  Labs: Recent Labs    05/14/21 0220 05/15/21 0323 05/15/21 0724 05/15/21 1436 05/16/21 0107  HGB 7.7* 7.9*  --  8.1* 7.9*  HCT 24.8* 25.8*  --  25.6* 24.6*  PLT 240 277  --  239 330  LABPROT 15.7* 15.4*  --   --  16.8*  INR 1.3* 1.2  --   --  1.4*  HEPARINUNFRC 0.46  --  0.47  --  0.33  CREATININE 1.10* 0.98  --   --  1.01*     Estimated Creatinine Clearance: 38.6 mL/min (A) (by C-G formula based on SCr of 1.01 mg/dL (H)).   Assessment: 76 yo female with a history of HFrEF, AICD, HTN, congential MR s/p [facility] MVR, and anemia presents with altered mental status and weakness. PTA the patient is on warfarin. INR upon admission was >10 and patient found to have SDH. Pharmacy is consulted to dose heparin and warfarin.   Warfarin Home Dose: 7.5 mg daily except 5 mg every Mon/Fri (per 03/25/21 visit)  -1/25: Warfarin held, [facility] 2115 units IV & vitamin K 10 mg IVPB given -1/27: Successful Onyx embolization of B/L middle meningeal arteries >> repeat CTH stable -1/31: CT shows increased density of SDH > stopped heparin & reversed with protamine > restarted gtt after 4h (clot risk with mechanical valve outweighed bleed risk per neuro) -2/1: Warfarin resumed (none given on 2/2)  INR subtherapeutic at 1.4 today, but appropriate rise from yesterday. Heparin level is therapeutic at 0.33 while heparin infusion is running at 1100 units/hr. Hgb 7.9, platelets 330. Per the RN, there have been no issues with  the heparin infusion and the patient's hematuria remains unchanged from admission. There have been no significant diet changes or new interacting medications.    Goal of Therapy:  Heparin level 0.3-0.5 units/ml (targeting lower end of goal due to hematuria) INR 2.5-3.5 Monitor platelets by anticoagulation protocol: Yes   Plan:  Continue heparin IV at 1100 units/hr Give warfarin 7.5 mg PO x 1 dose tonight Obtain a daily heparin level, PT/INR, and CBC Monitor for signs and symptoms of bleeding Watch for significant diet changes and drug-drug interactions  Shauna Hugh, PharmD, [place]  PGY-2 Pharmacy Resident 05/16/2021 7:40 AM  Please check [email or web address] for unit-specific pharmacy phone numbers.

## 2021-05-16 NOTE — Plan of Care (Signed)

## 2021-05-16 NOTE — Progress Notes (Addendum)
PROGRESS NOTE  Deborah [name] [id number] DOB: Nov 02, 1945 DOA: 05/05/2021 PCP: Merrilee Seashore, MD  HPI/Recap of past 34 hours: 76 year old female with prior history significant for HFrEF (4/22 EF 20-25%), AICD, HTN, congential MR s/p MVR [facility] on coumadin, HLD, DM, and anemia who presented to [facility] ER by EMS with altered mental status and weakness. Patient lives alone.  Her sister was unable to reach her, went to check on her and she was found on the bathroom floor.  EMS reports that she stated she had lost control of her bladder and sat on the floor to clean her self but was too weak to get up.  Denied falling, dizziness, syncope, etc.  Also reports not eating/ drinking for 16 hrs. Sister has noted a slow decline over the past several months.    Today, pt reported she was doing ok. Still appears lethargic, very deconditioned. Poor appetite. Denies any new complaints.    Assessment/Plan: Principal Problem:   Subdural hematoma without coma Active Problems:   Mitral valve disorder. (Status post mechanical valve replacement)   Chronic systolic CHF (congestive heart failure) (HCC)   AKI (acute kidney injury) (HCC)   Lactic acidosis   Warfarin-induced coagulopathy (HCC)   Failure to thrive in adult   Severe protein-calorie malnutrition (HCC)   Malnutrition of moderate degree   Subdural hematoma S/p MMA embolization on 1/28 Repeat CT head 1/30, 1/31 and 05/16/21 showed stable subdural hematomas, no evidence of acute hemorrhage Neurosurgery consulted Seizure precautions with keppra  Continue PT/OT, currently recommendations for SNF Delirium precautions  COVID-19 positive Incidental finding on admission On room air, improved coughing Off isolation precautions Supportive care with inhaler, cough suppressant prn, vitamins  Leukocytosis Remains afebrile Chest x-ray unremarkable, repeat UA unremarkable, UC pending BC x2, procalcitonin pending Just completed 5 days of  antibiotics, received another dose on 05/16/2021, will hold for now Daily CBC  Hypokalemia/hypomagnesemia Place as needed   Combined systolic and diastolic HF Severe MR s/p mechanical MVR (mechanical heart valve) Appears euvolemic, on RA Echo on 05/07/2021 showed EF <20%, global hypokinesis Strict intake and output, daily weight INR goal 2.5-3.5 Telemetry  HTN/HLD BP stable  Continue carvedilol, hydralazine, and Imdur   Proteus mirabilis UTI Gross hematuria (ongoing microscopic for yrs due to warfarin) Improving S/P IV ceftriaxone--> PO Keflex, completed 5 days on 05/15/21 Monitor   Warfarin-induced coagulopathy INR reversed with Kcentra IV heparin to p.o. warfarin bridge Daily INR (goal 2.5-3.5)  Macrocytic anemia Baseline hemoglobin around 8-10, now on a downward trend Noted recurrent/persistent hematuria while on anticoagulation On IV Heparin Anemia panel WNL Type and screen done Frequent CBC   AKI on CKD3B Improved Baseline creatinine around 1.3-1.4 Encourage oral hydration Daily BMP   Protein calorie malnutrition Functional decline Continue diet supplementation Registered dietitian consulted   Hyperglycemia A1c 5.4 Continue SSI, accuchecks, hypoglycemic protocol   GOC / Dispo Some concerns about patient's living situation and ability to care for self Plan for SNF placement Palliative consult for further [place] discussion (overall very poor prognosis) Pt made DNR on 05/15/21 after discussion with son       Malnutrition Type:  Nutrition Problem: Moderate Malnutrition Etiology: chronic illness (CHF)   Malnutrition Characteristics:  Signs/Symptoms: mild fat depletion, mild muscle depletion, moderate muscle depletion   Nutrition Interventions:  Interventions: Ensure Enlive (each supplement provides 350kcal and 20 grams of protein), MVI, Liberalize Diet    Estimated body mass index is 23.98 kg/m as calculated from the following:  Height as of  this encounter: 5\' 3"  (1.6 m).   Weight as of this encounter: 61.4 kg.     Code Status: DNR  Family Communication: Discussed with son on 05/15/21 about overall prognosis, switched to DNR  Disposition Plan: Status is: Inpatient Remains inpatient appropriate because: Level of care     Consultants: PCCM Neurosurgery Heart failure/cardiology   Procedures: MMA embolization on 1/28  Antimicrobials: Completed  p.o. Keflex  DVT prophylaxis: IV Heparin, Coumadin   Objective: Vitals:   05/15/21 2337 05/16/21 0312 05/16/21 0743 05/16/21 1141  BP: 126/81 134/78 (!) 142/76 128/77  Pulse: 71 74 77 83  Resp: 16 16 16 16   Temp: 98.1 F (36.7 C) 99.4 F (37.4 C) 98 F (36.7 C) 98.3 F (36.8 C)  TempSrc: Oral Oral Oral Oral  SpO2: 100% 96% 95% 100%  Weight:      Height:       No intake or output data in the 24 hours ending 05/16/21 1441  Filed Weights   05/05/21 1241 05/07/21 0600  Weight: 67.9 kg 61.4 kg    Exam: General: NAD, HOH Cardiovascular: S1, S2 present Respiratory: Diminished breath sounds bilaterally Abdomen: Soft, nontender, nondistended, bowel sounds present Musculoskeletal: No bilateral pedal edema noted Skin: Normal Psychiatry: Fair mood  Neurology: No obvious focal neurologic deficits noted, moving all extremities   Data Reviewed: CBC: Recent Labs  Lab 05/13/21 0304 05/14/21 0220 05/15/21 0323 05/15/21 1436 05/16/21 0107  WBC 9.7 9.5 9.9 11.1* 13.6*  HGB 7.9* 7.7* 7.9* 8.1* 7.9*  HCT 24.4* 24.8* 25.8* 25.6* 24.6*  MCV 100.0 101.2* 101.6* 99.2 101.7*  PLT 246 240 277 239 [id number]   Basic Metabolic Panel: Recent Labs  Lab 05/10/21 1933 05/11/21 0552 05/12/21 0500 05/13/21 1254 05/14/21 0220 05/15/21 0323 05/16/21 0107  NA 137   < > 138 139 138 138 139  K 4.1   < > 3.6 3.4* 3.7 3.5 3.4*  CL 104   < > 104 104 106 104 104  CO2 27   < > 27 27 25 24 25   GLUCOSE 153*   < > 81 136* 119* 110* 103*  BUN 19   < > 13 17 18 15 14   CREATININE  1.00   < > 1.05* 1.12* 1.10* 0.98 1.01*  CALCIUM 8.4*   < > 8.3* 8.6* 8.5* 8.6* 8.6*  MG 1.8  --   --   --   --   --  1.5*   < > = values in this interval not displayed.   GFR: Estimated Creatinine Clearance: 38.6 mL/min (A) (by C-G formula based on SCr of 1.01 mg/dL (H)). Liver Function Tests: No results for input(s): AST, ALT, ALKPHOS, BILITOT, PROT, ALBUMIN in the last 168 hours. No results for input(s): LIPASE, AMYLASE in the last 168 hours. No results for input(s): AMMONIA in the last 168 hours. Coagulation Profile: Recent Labs  Lab 05/12/21 0500 05/13/21 0304 05/14/21 0220 05/15/21 0323 05/16/21 0107  INR 1.2 1.1 1.3* 1.2 1.4*   Cardiac Enzymes: No results for input(s): CKTOTAL, CKMB, CKMBINDEX, TROPONINI in the last 168 hours. BNP (last 3 results) No results for input(s): PROBNP in the last 8760 hours. HbA1C: No results for input(s): HGBA1C in the last 72 hours. CBG: Recent Labs  Lab 05/15/21 1227 05/15/21 1711 05/15/21 2059 05/16/21 0608 05/16/21 1224  GLUCAP 110* 191* 112* 104* 156*   Lipid Profile: No results for input(s): CHOL, HDL, LDLCALC, TRIG, CHOLHDL, LDLDIRECT in the last 72 hours. Thyroid Function  Tests: No results for input(s): TSH, T4TOTAL, FREET4, T3FREE, THYROIDAB in the last 72 hours. Anemia Panel: Recent Labs    05/15/21 0323  VITAMINB12 691  FOLATE 17.0  FERRITIN 127  TIBC 326  IRON 58   Urine analysis:    Component Value Date/Time   COLORURINE AMBER (A) 05/16/2021 0749   APPEARANCEUR CLOUDY (A) 05/16/2021 0749   LABSPEC 1.025 05/16/2021 0749   PHURINE 5.5 05/16/2021 0749   GLUCOSEU NEGATIVE 05/16/2021 0749   HGBUR LARGE (A) 05/16/2021 0749   BILIRUBINUR SMALL (A) 05/16/2021 0749   KETONESUR NEGATIVE 05/16/2021 0749   PROTEINUR >300 (A) 05/16/2021 0749   UROBILINOGEN 1.0 05/10/2011 1440   NITRITE NEGATIVE 05/16/2021 0749   LEUKOCYTESUR NEGATIVE 05/16/2021 0749   Sepsis  Labs: @LABRCNTIP (procalcitonin:4,lacticidven:4)  ) Recent Results (from the past 240 hour(s))  Urine Culture     Status: Abnormal   Collection Time: 05/10/21  1:21 PM   Specimen: Urine, Catheterized  Result Value Ref Range Status   Specimen Description URINE, CATHETERIZED  Final   Special Requests   Final    NONE Performed at [facility], [place] [address] [place], [place] [address]    Culture >=100,000 COLONIES/mL PROTEUS MIRABILIS (A)  Final   Report Status 05/12/2021 FINAL  Final   Organism ID, Bacteria PROTEUS MIRABILIS (A)  Final      Susceptibility   Proteus mirabilis - MIC*    AMPICILLIN <=2 SENSITIVE Sensitive     CEFAZOLIN <=4 SENSITIVE Sensitive     CEFEPIME <=0.12 SENSITIVE Sensitive     CEFTRIAXONE <=0.25 SENSITIVE Sensitive     CIPROFLOXACIN <=0.25 SENSITIVE Sensitive     GENTAMICIN <=1 SENSITIVE Sensitive     IMIPENEM 1 SENSITIVE Sensitive     NITROFURANTOIN 128 RESISTANT Resistant     TRIMETH/SULFA <=20 SENSITIVE Sensitive     AMPICILLIN/SULBACTAM <=2 SENSITIVE Sensitive     PIP/TAZO <=4 SENSITIVE Sensitive     * >=100,000 COLONIES/mL PROTEUS MIRABILIS      Studies: CT HEAD WO CONTRAST (5MM)  Result Date: 05/16/2021 CLINICAL DATA:  Follow-up subdural hematoma. EXAM: CT HEAD WITHOUT CONTRAST TECHNIQUE: Contiguous axial images were obtained from the base of the skull through the vertex without intravenous contrast. RADIATION DOSE REDUCTION: This exam was performed according to the departmental dose-optimization program which includes automated exposure control, adjustment of the mA and/or kV according to patient size and/or use of iterative reconstruction technique. COMPARISON:  05/11/2021. FINDINGS: Brain: There is redemonstration of a mixed attenuation subdural hematoma on the right measuring up to 1.5 cm, axial image 21. There is a subdural hematoma on the left measuring up to 1.2 cm, axial image 15. There is a parafalcine subdural hematoma on the left  continuing along the tentorium cerebella bilaterally common decreased in density from the prior exam. There is mass effect on the cerebral hemispheres bilaterally with midline shift to the left at the level of the septum pellucidum of approximally 3 mm. No hydrocephalus. Periventricular white matter hypodensities are noted bilaterally. Sequela of bilateral meningeal artery on X embolization is unchanged. Vascular: No hyperdense vessel or unexpected calcification. Skull: Normal. Negative for fracture or focal lesion. Sinuses/Orbits: Mucosal thickening is present in the ethmoid air cells and sphenoid sinuses bilaterally. The orbits are within normal limits. No acute orbital abnormality Other: None. IMPRESSION: 1. Mixed attenuation right subdural hematoma now measuring up to 1.5 mm, slightly increased in size from the prior exam. 2. Mixed attenuation left hematoma now measuring approximally 1.2 cm, not significantly  changed from the prior exam. 3. Mixed attenuation parafalcine subdural hematoma, not significantly changed from the prior exam. 4. Mass effect on the cerebral hemispheres bilaterally with 3 mm midline shift to the left at the level of the septum pellucidum. Electronically Signed   By: Brett Fairy M.D.   On: 05/16/2021 01:04   DG Chest Port 1 View  Result Date: 05/16/2021 CLINICAL DATA:  Fever EXAM: PORTABLE CHEST 1 VIEW COMPARISON:  05/05/2021 FINDINGS: Transverse diameter of heart is increased. Central pulmonary vessels are prominent. Interstitial markings in the parahilar regions are slightly prominent. There is no focal pulmonary consolidation. There is blunting of both lateral CP angles. There is no pneumothorax. Pacemaker/defibrillator battery is seen in the left infraclavicular region. Degenerative changes are noted in both shoulders. IMPRESSION: Cardiomegaly. Central pulmonary vessels are prominent. Increased interstitial markings in the parahilar regions may suggest mild interstitial edema.  Small bilateral pleural effusions are seen. Electronically Signed   By: Elmer Picker M.D.   On: 05/16/2021 12:04    Scheduled Meds:  vitamin C  500 mg Oral Daily   atorvastatin  40 mg Oral Daily   carvedilol  12.5 mg Oral BID WC   Chlorhexidine Gluconate Cloth  6 each Topical Daily   dextromethorphan-guaiFENesin  1 tablet Oral BID   feeding supplement  237 mL Oral TID BM   hydrALAZINE  25 mg Oral TID   insulin aspart  0-15 Units Subcutaneous TID WC   Ipratropium-Albuterol  1 puff Inhalation Q6H   isosorbide mononitrate  30 mg Oral Daily   levETIRAcetam  500 mg Oral BID   polyethylene glycol  17 g Oral Daily   sodium chloride flush  3 mL Intravenous Q12H   warfarin  7.5 mg Oral ONCE-1600   Warfarin - Pharmacist Dosing Inpatient   Does not apply q1600   zinc sulfate  220 mg Oral Daily    Continuous Infusions:  cefTRIAXone (ROCEPHIN)  IV 1 g (05/16/21 1136)   heparin 1,100 Units/hr (05/16/21 0924)     LOS: 11 days     Alma Friendly, MD [facility]  If 7PM-7AM, please contact night-coverage [email or web address] 05/16/2021, 2:41 PM

## 2021-05-16 NOTE — Progress Notes (Signed)
Patient somewhat more depressed today.  Follow-up head CT scan demonstrates evolution of her bilateral subdural hemorrhages.  Emerges now isodense to the brain.  The radiologist question some increase in size on the right side.  I do not appreciate this.  There is no change in mass-effect.  There is no evidence of acute hemorrhage.  I would not recommend any type of change in treatment plan.

## 2021-05-17 ENCOUNTER — Inpatient Hospital Stay (HOSPITAL_COMMUNITY): Payer: Medicare Other

## 2021-05-17 ENCOUNTER — Other Ambulatory Visit: Payer: Self-pay

## 2021-05-17 LAB — CBC
HCT: 25.7 % — ABNORMAL LOW (ref 36.0–46.0)
Hemoglobin: 8 g/dL — ABNORMAL LOW (ref 12.0–15.0)
MCH: 32.1 pg (ref 26.0–34.0)
MCHC: 31.1 g/dL (ref 30.0–36.0)
MCV: 103.2 fL — ABNORMAL HIGH (ref 80.0–100.0)
Platelets: 288 10*3/uL (ref 150–400)
RBC: 2.49 MIL/uL — ABNORMAL LOW (ref 3.87–5.11)
RDW: 15.1 % (ref 11.5–15.5)
WBC: 14.5 10*3/uL — ABNORMAL HIGH (ref 4.0–10.5)
nRBC: 0.2 % (ref 0.0–0.2)

## 2021-05-17 LAB — HEPARIN LEVEL (UNFRACTIONATED)
Heparin Unfractionated: 0.31 IU/mL (ref 0.30–0.70)
Heparin Unfractionated: 0.37 IU/mL (ref 0.30–0.70)

## 2021-05-17 LAB — GLUCOSE, CAPILLARY
Glucose-Capillary: 161 mg/dL — ABNORMAL HIGH (ref 70–99)
Glucose-Capillary: 162 mg/dL — ABNORMAL HIGH (ref 70–99)
Glucose-Capillary: 183 mg/dL — ABNORMAL HIGH (ref 70–99)
Glucose-Capillary: 174 mg/dL — ABNORMAL HIGH (ref 70–99)
Glucose-Capillary: 148 mg/dL — ABNORMAL HIGH (ref 70–99)

## 2021-05-17 LAB — MAGNESIUM: Magnesium: 2 mg/dL (ref 1.7–2.4)

## 2021-05-17 LAB — BASIC METABOLIC PANEL
Anion gap: 10 (ref 5–15)
BUN: 20 mg/dL (ref 8–23)
CO2: 24 mmol/L (ref 22–32)
Calcium: 8.6 mg/dL — ABNORMAL LOW (ref 8.9–10.3)
Chloride: 101 mmol/L (ref 98–111)
Creatinine, Ser: 1.35 mg/dL — ABNORMAL HIGH (ref 0.44–1.00)
GFR, Estimated: 40 mL/min — ABNORMAL LOW (ref >60)
Glucose, Bld: 200 mg/dL — ABNORMAL HIGH (ref 70–99)
Potassium: 4.2 mmol/L (ref 3.5–5.1)
Sodium: 135 mmol/L (ref 135–145)

## 2021-05-17 LAB — PROTIME-INR
INR: 1.8 — ABNORMAL HIGH (ref 0.8–1.2)
Prothrombin Time: 20.8 seconds — ABNORMAL HIGH (ref 11.4–15.2)

## 2021-05-17 LAB — URINE CULTURE: Culture: NO GROWTH

## 2021-05-17 LAB — PROCALCITONIN: Procalcitonin: <0.1 ng/mL

## 2021-05-17 MED ORDER — WARFARIN SODIUM 7.5 MG PO TABS
7.5000 mg | ORAL_TABLET | Freq: Once | ORAL | Status: AC
  Administered 2021-05-17: 7.5 mg via ORAL
  Filled 2021-05-17: qty 1

## 2021-05-17 NOTE — Progress Notes (Signed)
PROGRESS NOTE  Deborah [name] [id number] DOB: Apr 29, 1945 DOA: 05/05/2021 PCP: Merrilee Seashore, MD  HPI/Recap of past 23 hours: 76 year old female with prior history significant for HFrEF (4/22 EF 20-25%), AICD, HTN, congential MR s/p MVR [facility] on coumadin, HLD, DM, and anemia who presented to [facility] ER by EMS with altered mental status and weakness. Patient lives alone.  Her sister was unable to reach her, went to check on her and she was found on the bathroom floor.  EMS reports that she stated she had lost control of her bladder and sat on the floor to clean her self but was too weak to get up.  Denied falling, dizziness, syncope, etc.  Also reports not eating/ drinking for 16 hrs. Sister has noted a slow decline over the past several months.    Today, patient still lethargic, denies any new complaints.    Assessment/Plan: Principal Problem:   Subdural hematoma without coma Active Problems:   Mitral valve disorder. (Status post mechanical valve replacement)   Chronic systolic CHF (congestive heart failure) (HCC)   AKI (acute kidney injury) (HCC)   Lactic acidosis   Warfarin-induced coagulopathy (HCC)   Failure to thrive in adult   Severe protein-calorie malnutrition (HCC)   Malnutrition of moderate degree   Subdural hematoma S/p MMA embolization on 1/28 Repeat CT head 1/30, 1/31 and 05/16/21 showed stable subdural hematomas, no evidence of acute hemorrhage Neurosurgery consulted Seizure precautions with keppra  Continue PT/OT, currently recommendations for SNF Delirium precautions  COVID-19 positive Incidental finding on admission On room air, resolved coughing Off isolation precautions Supportive care with inhaler, cough suppressant prn, vitamins  Leukocytosis Remains afebrile Chest x-ray unremarkable, repeat UA unremarkable, UC no growth BC x2 pending, procalcitonin < 0.10 Abd xray pending Just completed 6 days of antibiotics on 05/16/2021, no current  indication to restart antibiotics Daily CBC  Hypokalemia/hypomagnesemia Replace as needed   Combined systolic and diastolic HF Severe MR s/p mechanical MVR (mechanical heart valve) Appears euvolemic, on RA Echo on 05/07/2021 showed EF <20%, global hypokinesis Strict intake and output, daily weight INR goal 2.5-3.5 Telemetry  HTN/HLD BP stable  Continue carvedilol, hydralazine, and Imdur   Proteus mirabilis UTI Gross hematuria (ongoing microscopic for yrs due to warfarin) Improving S/P IV ceftriaxone--> PO Keflex, completed 6 days on 05/16/21 Monitor   Warfarin-induced coagulopathy INR reversed with Kcentra IV heparin to p.o. warfarin bridge Daily INR (goal 2.5-3.5)  Macrocytic anemia Baseline hemoglobin around 8-10 Noted recurrent/persistent hematuria while on anticoagulation On IV Heparin Anemia panel WNL Type and screen done Daily CBC   AKI on CKD3B Baseline creatinine around 1.3-1.4 Encourage oral hydration, appears dry Daily BMP   Protein calorie malnutrition Functional decline Continue diet supplementation Registered dietitian consulted   Hyperglycemia A1c 5.4 Continue SSI, accuchecks, hypoglycemic protocol   GOC / Dispo Plan for SNF placement Palliative consult for further [place] discussion (overall very poor prognosis) Pt made DNR on 05/15/21 after discussion with son       Malnutrition Type:  Nutrition Problem: Moderate Malnutrition Etiology: chronic illness (CHF)   Malnutrition Characteristics:  Signs/Symptoms: mild fat depletion, mild muscle depletion, moderate muscle depletion   Nutrition Interventions:  Interventions: Ensure Enlive (each supplement provides 350kcal and 20 grams of protein), MVI, Liberalize Diet    Estimated body mass index is 23.98 kg/m as calculated from the following:   Height as of this encounter: 5\' 3"  (1.6 m).   Weight as of this encounter: 61.4 kg.  Code Status: DNR  Family Communication: Discussed with  son on 05/16/21  Disposition Plan: Status is: Inpatient Remains inpatient appropriate because: Level of care     Consultants: PCCM Neurosurgery Heart failure/cardiology   Procedures: MMA embolization on 1/28  Antimicrobials: Completed  ceftriaxone/Keflex  DVT prophylaxis: IV Heparin, Coumadin   Objective: Vitals:   05/17/21 0034 05/17/21 0347 05/17/21 0727 05/17/21 1109  BP: 95/77 115/71 134/85 (!) 114/59  Pulse: 86 81 91 81  Resp: 15 14 16 16   Temp: 98.2 F (36.8 C) 97.6 F (36.4 C) 97.6 F (36.4 C) 98 F (36.7 C)  TempSrc: Oral Oral Oral Oral  SpO2: 95% 94%  97%  Weight:      Height:        Intake/Output Summary (Last 24 hours) at 05/17/2021 1522 Last data filed at 05/17/2021 1343 Gross per 24 hour  Intake 770.69 ml  Output --  Net 770.69 ml    Filed Weights   05/05/21 1241 05/07/21 0600  Weight: 67.9 kg 61.4 kg    Exam: General: NAD, HOH Cardiovascular: S1, S2 present Respiratory: Diminished breath sounds bilaterally Abdomen: Soft, nontender, nondistended, bowel sounds present Musculoskeletal: No bilateral pedal edema noted Skin: Normal Psychiatry: Fair mood  Neurology: No obvious focal neurologic deficits noted, moving all extremities   Data Reviewed: CBC: Recent Labs  Lab 05/14/21 0220 05/15/21 0323 05/15/21 1436 05/16/21 0107 05/17/21 0416  WBC 9.5 9.9 11.1* 13.6* 14.5*  HGB 7.7* 7.9* 8.1* 7.9* 8.0*  HCT 24.8* 25.8* 25.6* 24.6* 25.7*  MCV 101.2* 101.6* 99.2 101.7* 103.2*  PLT 240 277 239 330 [id number]   Basic Metabolic Panel: Recent Labs  Lab 05/10/21 1933 05/11/21 0552 05/13/21 1254 05/14/21 0220 05/15/21 0323 05/16/21 0107 05/17/21 0416  NA 137   < > 139 138 138 139 135  K 4.1   < > 3.4* 3.7 3.5 3.4* 4.2  CL 104   < > 104 106 104 104 101  CO2 27   < > 27 25 24 25 24   GLUCOSE 153*   < > 136* 119* 110* 103* 200*  BUN 19   < > 17 18 15 14 20   CREATININE 1.00   < > 1.12* 1.10* 0.98 1.01* 1.35*  CALCIUM 8.4*   < > 8.6* 8.5* 8.6*  8.6* 8.6*  MG 1.8  --   --   --   --  1.5* 2.0   < > = values in this interval not displayed.   GFR: Estimated Creatinine Clearance: 28.9 mL/min (A) (by C-G formula based on SCr of 1.35 mg/dL (H)). Liver Function Tests: No results for input(s): AST, ALT, ALKPHOS, BILITOT, PROT, ALBUMIN in the last 168 hours. No results for input(s): LIPASE, AMYLASE in the last 168 hours. No results for input(s): AMMONIA in the last 168 hours. Coagulation Profile: Recent Labs  Lab 05/13/21 0304 05/14/21 0220 05/15/21 0323 05/16/21 0107 05/17/21 0416  INR 1.1 1.3* 1.2 1.4* 1.8*   Cardiac Enzymes: No results for input(s): CKTOTAL, CKMB, CKMBINDEX, TROPONINI in the last 168 hours. BNP (last 3 results) No results for input(s): PROBNP in the last 8760 hours. HbA1C: No results for input(s): HGBA1C in the last 72 hours. CBG: Recent Labs  Lab 05/16/21 1224 05/16/21 1650 05/16/21 2112 05/17/21 0626 05/17/21 1108  GLUCAP 156* 161* 205* 162* 183*   Lipid Profile: No results for input(s): CHOL, HDL, LDLCALC, TRIG, CHOLHDL, LDLDIRECT in the last 72 hours. Thyroid Function Tests: No results for input(s): TSH, T4TOTAL, FREET4, T3FREE, THYROIDAB in  the last 72 hours. Anemia Panel: Recent Labs    05/15/21 0323  VITAMINB12 691  FOLATE 17.0  FERRITIN 127  TIBC 326  IRON 58   Urine analysis:    Component Value Date/Time   COLORURINE AMBER (A) 05/16/2021 0749   APPEARANCEUR CLOUDY (A) 05/16/2021 0749   LABSPEC 1.025 05/16/2021 0749   PHURINE 5.5 05/16/2021 0749   GLUCOSEU NEGATIVE 05/16/2021 0749   HGBUR LARGE (A) 05/16/2021 0749   BILIRUBINUR SMALL (A) 05/16/2021 0749   KETONESUR NEGATIVE 05/16/2021 0749   PROTEINUR >300 (A) 05/16/2021 0749   UROBILINOGEN 1.0 05/10/2011 1440   NITRITE NEGATIVE 05/16/2021 0749   LEUKOCYTESUR NEGATIVE 05/16/2021 0749   Sepsis Labs: @LABRCNTIP (procalcitonin:4,lacticidven:4)  ) Recent Results (from the past 240 hour(s))  Urine Culture     Status:  Abnormal   Collection Time: 05/10/21  1:21 PM   Specimen: Urine, Catheterized  Result Value Ref Range Status   Specimen Description URINE, CATHETERIZED  Final   Special Requests   Final    NONE Performed at [facility], [place] [address] [place], [place] [address]    Culture >=100,000 COLONIES/mL PROTEUS MIRABILIS (A)  Final   Report Status 05/12/2021 FINAL  Final   Organism ID, Bacteria PROTEUS MIRABILIS (A)  Final      Susceptibility   Proteus mirabilis - MIC*    AMPICILLIN <=2 SENSITIVE Sensitive     CEFAZOLIN <=4 SENSITIVE Sensitive     CEFEPIME <=0.12 SENSITIVE Sensitive     CEFTRIAXONE <=0.25 SENSITIVE Sensitive     CIPROFLOXACIN <=0.25 SENSITIVE Sensitive     GENTAMICIN <=1 SENSITIVE Sensitive     IMIPENEM 1 SENSITIVE Sensitive     NITROFURANTOIN 128 RESISTANT Resistant     TRIMETH/SULFA <=20 SENSITIVE Sensitive     AMPICILLIN/SULBACTAM <=2 SENSITIVE Sensitive     PIP/TAZO <=4 SENSITIVE Sensitive     * >=100,000 COLONIES/mL PROTEUS MIRABILIS  Urine Culture     Status: None   Collection Time: 05/16/21  7:49 AM   Specimen: Urine, Clean Catch  Result Value Ref Range Status   Specimen Description URINE, CLEAN CATCH  Final   Special Requests NONE  Final   Culture   Final    NO GROWTH Performed at [facility], [place] [address] [place], [place] [address]    Report Status 05/17/2021 FINAL  Final      Studies: No results found.  Scheduled Meds:  vitamin C  500 mg Oral Daily   atorvastatin  40 mg Oral Daily   carvedilol  12.5 mg Oral BID WC   Chlorhexidine Gluconate Cloth  6 each Topical Daily   feeding supplement  237 mL Oral TID BM   hydrALAZINE  25 mg Oral TID   insulin aspart  0-15 Units Subcutaneous TID WC   Ipratropium-Albuterol  1 puff Inhalation Q6H   isosorbide mononitrate  30 mg Oral Daily   levETIRAcetam  500 mg Oral BID   polyethylene glycol  17 g Oral Daily   sodium chloride flush  3 mL Intravenous Q12H   warfarin  7.5 mg Oral ONCE-1600    Warfarin - Pharmacist Dosing Inpatient   Does not apply q1600   zinc sulfate  220 mg Oral Daily    Continuous Infusions:  heparin 1,150 Units/hr (05/17/21 1343)     LOS: 12 days     Alma Friendly, MD [facility]  If 7PM-7AM, please contact night-coverage [email or web address] 05/17/2021, 3:22 PM

## 2021-05-17 NOTE — Progress Notes (Signed)
ANTICOAGULATION CONSULT NOTE - Follow Up Consult  Pharmacy Consult for Heparin + Warfarin Indication:  mechanical heart valve  Allergies  Allergen Reactions   Strawberry Extract Rash    Rash & itching.   Patient Measurements: Height: 5\' 3"  (160 cm) Weight: 61.4 kg (135 lb 5.8 oz) IBW/kg (Calculated) : 52.4 kg Heparin Dosing Weight: 61.4 kg  Vital Signs: Temp: 98 F (36.7 C) (02/06 2004) Temp Source: Oral (02/06 2004) BP: 105/60 (02/06 2004) Pulse Rate: 76 (02/06 2004)  Labs: Recent Labs    05/15/21 0323 05/15/21 0724 05/15/21 1436 05/16/21 0107 05/17/21 0416 05/17/21 2122  HGB 7.9*  --  8.1* 7.9* 8.0*  --   HCT 25.8*  --  25.6* 24.6* 25.7*  --   PLT 277  --  239 330 288  --   LABPROT 15.4*  --   --  16.8* 20.8*  --   INR 1.2  --   --  1.4* 1.8*  --   HEPARINUNFRC  --    < >  --  0.33 0.31 0.37  CREATININE 0.98  --   --  1.01* 1.35*  --    < > = values in this interval not displayed.     Estimated Creatinine Clearance: 28.9 mL/min (A) (by C-G formula based on SCr of 1.35 mg/dL (H)).   Assessment: 76 yo female with a history of HFrEF, AICD, HTN, congential MR s/p [facility] [facility] MVR, and anemia presents with altered mental status and weakness. PTA the patient is on warfarin. INR upon admission was >10 and patient found to have SDH. Pharmacy is consulted to dose heparin and warfarin.   Warfarin Home Dose: 7.5 mg daily except 5 mg every Mon/Fri (per 03/25/21 visit)  -1/25: Warfarin held, [facility] 2115 units IV & vitamin K 10 mg IVPB given -1/27: Successful Onyx embolization of B/L middle meningeal arteries >> repeat CTH stable -1/31: CT shows increased density of SDH > stopped heparin & reversed with protamine > restarted gtt after 4h (clot risk with mechanical valve outweighed bleed risk per neuro) -2/1: Warfarin resumed (none given on 2/2)  INR subtherapeutic at 1.8 today. Heparin level is therapeutic at 0.37 while heparin infusion is running at 1150 units/hr. Hgb 8,  platelets 288. No bleeding issues noted.   Goal of Therapy:  Heparin level 0.3-0.5 units/ml (targeting lower end of goal due to hematuria) INR 2.5-3.5 Monitor platelets by anticoagulation protocol: Yes   Plan:  Continue heparin IV at 1150 units/hr Give warfarin 7.5 mg PO x 1 dose tonight Obtain a daily heparin level, PT/INR, and CBC Monitor for signs and symptoms of bleeding Watch for significant diet changes and drug-drug interactions  Erin Hearing PharmD., BCPS Clinical Pharmacist 05/17/2021 10:19 PM

## 2021-05-17 NOTE — Progress Notes (Signed)
ANTICOAGULATION CONSULT NOTE  Pharmacy Consult for Heparin + Warfarin Indication:  MVR  Allergies  Allergen Reactions   Strawberry Extract Rash    Rash & itching.    Patient Measurements: Height: 5\' 3"  (160 cm) Weight: 61.4 kg (135 lb 5.8 oz) IBW/kg (Calculated) : 52.4  Heparin Dosing Weight: 66 kg  Vital Signs: Temp: 98 F (36.7 C) (02/06 1109) Temp Source: Oral (02/06 1109) BP: 114/59 (02/06 1109) Pulse Rate: 81 (02/06 1109)  Labs: Recent Labs    05/15/21 0323 05/15/21 0724 05/15/21 1436 05/16/21 0107 05/17/21 0416  HGB 7.9*  --  8.1* 7.9* 8.0*  HCT 25.8*  --  25.6* 24.6* 25.7*  PLT 277  --  239 330 288  LABPROT 15.4*  --   --  16.8* 20.8*  INR 1.2  --   --  1.4* 1.8*  HEPARINUNFRC  --  0.47  --  0.33 0.31  CREATININE 0.98  --   --  1.01* 1.35*    Estimated Creatinine Clearance: 28.9 mL/min (A) (by C-G formula based on SCr of 1.35 mg/dL (H)).   Assessment: 76 yo female with a history of HFrEF, AICD, HTN, congential MR s/p [facility] [facility] MVR, and anemia presents with altered mental status and weakness. PTA the patient is on warfarin. INR upon admission was >10 and patient found to have SDH. Pharmacy is consulted to dose heparin and warfarin.    Warfarin Home Dose: 7.5 mg daily except 5 mg every Mon/Fri (per 03/25/21 visit)   -1/25: Warfarin held, [facility] 2115 units IV & vitamin K 10 mg IVPB given -1/27: Successful Onyx embolization of B/L middle meningeal arteries >> repeat CTH stable -1/31: CT shows increased density of SDH > stopped heparin & reversed with protamine > restarted gtt after 4h (clot risk with mechanical valve outweighed bleed risk per neuro) -2/01: Warfarin resumed (none given on 2/2) 2/05: CT shows evolution of bilateral subdural hemorrhages but no change in mass-effect or evidence of acute hemorrhage   INR remains subtherapeutic at 1.8, but appropriate rise from yesterday. Heparin level is therapeutic at 0.31 with heparin infusion running at 1100  units/hr. Hgb 8.0, platelets 288. No overt signs/symptoms of bleeding noted and no reported issues with the heparin infusion.  Goal of Therapy:  Heparin level 0.3-0.5 units/ml (targeting lower end of goal due to hematuria) INR 2.5-3.5 Monitor platelets by anticoagulation protocol: Yes   Plan:  Give warfarin 7.5 mg PO x 1 dose tonight Increase heparin infusion to 1150 units/hr Check heparin level in 8 hours and daily while on heparin Check INR daily while on warfarin Continue to monitor H&H and platelets    Thank you for allowing pharmacy to be a part of this patients care.  Ardyth Harps, PharmD Clinical Pharmacist

## 2021-05-17 NOTE — Progress Notes (Addendum)
Physical Therapy Treatment Patient Details Name: Deborah [name] MRN: [id number] DOB: 05/13/1945 Today's Date: 05/17/2021   History of Present Illness 76 yo admitted 1/25 after found on bathroom floor by family. Pt with bifrontal and acute parafalcine SDH, pt denies fall. s/p 1/27 Onyx embolization of bilateral middle meningeal arteries PMHx: CHF, FTT, HOH, DM, HLD, HTN    PT Comments    Pt asleep on arrival.  Now off covid isolation.  Difficult to arouse, but persistence and help from her children paid off.  Emphasis on transition to EOB, sitting balance, sit to stand, progression of gait with the RW and 2 persons   Recommendations for follow up therapy are one component of a multi-disciplinary discharge planning process, led by the attending physician.  Recommendations may be updated based on patient status, additional functional criteria and insurance authorization.  Follow Up Recommendations  Skilled nursing-short term rehab (<3 hours/day)     Assistance Recommended at Discharge Frequent or constant Supervision/Assistance  Patient can return home with the following A little help with walking and/or transfers;Help with stairs or ramp for entrance;Assistance with cooking/housework;Direct supervision/assist for financial management;Direct supervision/assist for medications management;Assist for transportation   Equipment Recommendations  Rolling walker (2 wheels);BSC/3in1    Recommendations for Other Services       Precautions / Restrictions Precautions Precautions: Fall     Mobility  Bed Mobility   Bed Mobility: Supine to Sit     Supine to sit: Mod assist     General bed mobility comments: verbal/physical cues  for direction, assist up and toward EOB.  continuous cues to keep pt on task and not letting her drift off.    Transfers Overall transfer level: Needs assistance Equipment used: Rolling walker (2 wheels) Transfers: Sit to/from Stand Sit to Stand: Mod assist            General transfer comment: cues for direction.  assist forward and to boost.    Ambulation/Gait Ambulation/Gait assistance: Min assist, +2 physical assistance Gait Distance (Feet): 40 Feet Assistive device: Rolling walker (2 wheels) Gait Pattern/deviations: Step-through pattern, Decreased step length - right, Decreased step length - left, Decreased stride length   Gait velocity interpretation: <1.8 ft/sec, indicate of risk for recurrent falls   General Gait Details: dues for task and redirection.  Stability assist and for progression of gait forward.   Stairs             Wheelchair Mobility    Modified Rankin (Stroke Patients Only) Modified Rankin (Stroke Patients Only) Modified Rankin: Moderately severe disability     Balance Overall balance assessment: Needs assistance   Sitting balance-Leahy Scale: Fair       Standing balance-Leahy Scale: Poor Standing balance comment: still needing AD and or external stability assist                            Cognition Arousal/Alertness: Lethargic, Awake/alert Behavior During Therapy: Flat affect Overall Cognitive Status: Impaired/Different from baseline                                 General Comments: defficult to determine the extent of cognitive impairment        Exercises      General Comments General comments (skin integrity, edema, etc.): Once cuing or activity stops, pt back in the chair, She drifts off asleep or closes her eyes and "turns  off"      Pertinent Vitals/Pain Pain Assessment Faces Pain Scale: No hurt Pain Intervention(s): Monitored during session    Home Living                          Prior Function            PT Goals (current goals can now be found in the care plan section) Acute Rehab PT Goals Patient Stated Goal: be independent PT Goal Formulation: With patient/family Time For Goal Achievement: 05/20/21 Potential to Achieve Goals:  Fair Progress towards PT goals: Progressing toward goals    Frequency           PT Plan Current plan remains appropriate    Co-evaluation              AM-PAC PT "6 Clicks" Mobility   Outcome Measure  Help needed turning from your back to your side while in a flat bed without using bedrails?: A Little Help needed moving from lying on your back to sitting on the side of a flat bed without using bedrails?: A Lot Help needed moving to and from a bed to a chair (including a wheelchair)?: A Lot Help needed standing up from a chair using your arms (e.g., wheelchair or bedside chair)?: A Lot Help needed to walk in hospital room?: A Lot Help needed climbing 3-5 steps with a railing? : A Lot 6 Click Score: 13    End of Session   Activity Tolerance: Patient tolerated treatment well;Patient limited by fatigue Patient left: in chair;with call bell/phone within reach;with family/visitor present Nurse Communication: Mobility status PT Visit Diagnosis: Other abnormalities of gait and mobility (R26.89);Difficulty in walking, not elsewhere classified (R26.2);Muscle weakness (generalized) (M62.81)     Time: 5631-4970 PT Time Calculation (min) (ACUTE ONLY): 20 min  Charges:  $Gait Training: 8-22 mins                     05/17/2021  Jacinto Halim., PT Acute Rehabilitation Services [phone]  (pager) [phone]  (office)   Eliseo Gum Allah Reason 05/17/2021, 4:23 PM

## 2021-05-17 NOTE — Care Management Important Message (Signed)
Important Message  Patient Details  Name: Deborah [name] MRN: [id number] Date of Birth: [birth year]   Medicare Important Message Given:  Yes     [name] 05/17/2021, 2:00 PM

## 2021-05-17 NOTE — Progress Notes (Signed)
Pt seen and examined. Exam non-focal, though she rests or sleeps most of the day. She arouses and regards [place] and FC. CTH from yesterday reviewed. SDHs are not really bigger, but no smaller. They are becoming less hyperdense, so they are evolving. Hopefully the MMA embolization will slowly reduce them? Would love to avoid surgical drainage if possible./ spoke to son/ daughter (I believe) at bedside today.

## 2021-05-18 LAB — BASIC METABOLIC PANEL
Anion gap: 8 (ref 5–15)
BUN: 29 mg/dL — ABNORMAL HIGH (ref 8–23)
CO2: 25 mmol/L (ref 22–32)
Calcium: 8.6 mg/dL — ABNORMAL LOW (ref 8.9–10.3)
Chloride: 104 mmol/L (ref 98–111)
Creatinine, Ser: 1.47 mg/dL — ABNORMAL HIGH (ref 0.44–1.00)
GFR, Estimated: 37 mL/min — ABNORMAL LOW (ref >60)
Glucose, Bld: 103 mg/dL — ABNORMAL HIGH (ref 70–99)
Potassium: 4.4 mmol/L (ref 3.5–5.1)
Sodium: 137 mmol/L (ref 135–145)

## 2021-05-18 LAB — URINALYSIS, ROUTINE W REFLEX MICROSCOPIC
Glucose, UA: NEGATIVE mg/dL
Ketones, ur: NEGATIVE mg/dL
Nitrite: NEGATIVE
Protein, ur: >300 mg/dL — AB
Specific Gravity, Urine: >1.03 — ABNORMAL HIGH (ref 1.005–1.030)
pH: 5.5 (ref 5.0–8.0)

## 2021-05-18 LAB — CBC
HCT: 24.7 % — ABNORMAL LOW (ref 36.0–46.0)
HCT: 24.7 % — ABNORMAL LOW (ref 36.0–46.0)
HCT: 27.5 % — ABNORMAL LOW (ref 36.0–46.0)
Hemoglobin: 7.8 g/dL — ABNORMAL LOW (ref 12.0–15.0)
Hemoglobin: 7.6 g/dL — ABNORMAL LOW (ref 12.0–15.0)
Hemoglobin: 8.4 g/dL — ABNORMAL LOW (ref 12.0–15.0)
MCH: 32.2 pg (ref 26.0–34.0)
MCH: 31.8 pg (ref 26.0–34.0)
MCH: 31.7 pg (ref 26.0–34.0)
MCHC: 31.6 g/dL (ref 30.0–36.0)
MCHC: 30.8 g/dL (ref 30.0–36.0)
MCHC: 30.5 g/dL (ref 30.0–36.0)
MCV: 102.1 fL — ABNORMAL HIGH (ref 80.0–100.0)
MCV: 103.3 fL — ABNORMAL HIGH (ref 80.0–100.0)
MCV: 103.8 fL — ABNORMAL HIGH (ref 80.0–100.0)
Platelets: 270 10*3/uL (ref 150–400)
Platelets: 285 10*3/uL (ref 150–400)
Platelets: 306 10*3/uL (ref 150–400)
RBC: 2.42 MIL/uL — ABNORMAL LOW (ref 3.87–5.11)
RBC: 2.39 MIL/uL — ABNORMAL LOW (ref 3.87–5.11)
RBC: 2.65 MIL/uL — ABNORMAL LOW (ref 3.87–5.11)
RDW: 15.4 % (ref 11.5–15.5)
RDW: 15.3 % (ref 11.5–15.5)
RDW: 15.5 % (ref 11.5–15.5)
WBC: 14.3 10*3/uL — ABNORMAL HIGH (ref 4.0–10.5)
WBC: 14.5 10*3/uL — ABNORMAL HIGH (ref 4.0–10.5)
WBC: 14.4 10*3/uL — ABNORMAL HIGH (ref 4.0–10.5)
nRBC: 0.3 % — ABNORMAL HIGH (ref 0.0–0.2)
nRBC: 0.3 % — ABNORMAL HIGH (ref 0.0–0.2)
nRBC: 0.5 % — ABNORMAL HIGH (ref 0.0–0.2)

## 2021-05-18 LAB — URINALYSIS, MICROSCOPIC (REFLEX): RBC / HPF: >50 RBC/hpf (ref 0–5)

## 2021-05-18 LAB — PROTIME-INR
INR: 2.6 — ABNORMAL HIGH (ref 0.8–1.2)
Prothrombin Time: 28 seconds — ABNORMAL HIGH (ref 11.4–15.2)

## 2021-05-18 LAB — HEPARIN LEVEL (UNFRACTIONATED): Heparin Unfractionated: 0.36 IU/mL (ref 0.30–0.70)

## 2021-05-18 LAB — GLUCOSE, CAPILLARY
Glucose-Capillary: 95 mg/dL (ref 70–99)
Glucose-Capillary: 80 mg/dL (ref 70–99)
Glucose-Capillary: 102 mg/dL — ABNORMAL HIGH (ref 70–99)
Glucose-Capillary: 92 mg/dL (ref 70–99)

## 2021-05-18 MED ORDER — IPRATROPIUM-ALBUTEROL 20-100 MCG/ACT IN AERS: 1.0000 | INHALATION_SPRAY | Freq: Four times a day (QID) | RESPIRATORY_TRACT | Status: DC | PRN

## 2021-05-18 MED ORDER — WARFARIN SODIUM 1 MG PO TABS
1.0000 mg | ORAL_TABLET | Freq: Once | ORAL | Status: AC
  Administered 2021-05-18: 1 mg via ORAL
  Filled 2021-05-18: qty 1

## 2021-05-18 MED ORDER — SODIUM CHLORIDE 0.9 % IV SOLN: INTRAVENOUS | Status: DC

## 2021-05-18 NOTE — Progress Notes (Signed)
ANTICOAGULATION CONSULT NOTE  Pharmacy Consult for Heparin + Warfarin Indication:  MVR  Allergies  Allergen Reactions   Strawberry Extract Rash    Rash & itching.    Patient Measurements: Height: 5\' 3"  (160 cm) Weight: 61.4 kg (135 lb 5.8 oz) IBW/kg (Calculated) : 52.4  Heparin Dosing Weight: 66 kg  Vital Signs: Temp: 98.1 F (36.7 C) (02/07 0411) Temp Source: Oral (02/07 0411) BP: 113/74 (02/07 0411) Pulse Rate: 84 (02/07 0411)  Labs: Recent Labs    05/16/21 0107 05/17/21 0416 05/17/21 2122 05/18/21 0314 05/18/21 0723  HGB 7.9* 8.0*  --  7.8* 7.6*  HCT 24.6* 25.7*  --  24.7* 24.7*  PLT 330 288  --  270 285  LABPROT 16.8* 20.8*  --  28.0*  --   INR 1.4* 1.8*  --  2.6*  --   HEPARINUNFRC 0.33 0.31 0.37 0.36  --   CREATININE 1.01* 1.35*  --  1.47*  --     Estimated Creatinine Clearance: 26.5 mL/min (A) (by C-G formula based on SCr of 1.47 mg/dL (H)).   Assessment: 76 yo female with a history of HFrEF, AICD, HTN, congential MR s/p [facility] [facility] MVR, and anemia presents with altered mental status and weakness. PTA the patient is on warfarin. INR upon admission was >10 and patient found to have SDH. Pharmacy is consulted to dose heparin and warfarin.    Warfarin Home Dose: 7.5 mg daily except 5 mg every Mon/Fri (per 03/25/21 visit)   -1/25: Warfarin held, [facility] 2115 units IV & vitamin K 10 mg IVPB given -1/27: Successful Onyx embolization of B/L middle meningeal arteries >> repeat CTH stable -1/31: CT shows increased density of SDH > stopped heparin & reversed with protamine > restarted gtt after 4h (clot risk with mechanical valve outweighed bleed risk per neuro) -2/01: Warfarin resumed (none given on 2/2) 2/05: CT shows evolution of bilateral subdural hemorrhages but no change in mass-effect or evidence of acute hemorrhage   INR now therapeutic with a large increase from 1.8 > 2.6. No changes recorded in diet, however per discussion with RN, has had decreased  appetite. Also reportedly having worse urine output, possibly brown/amber tinted, along with a worsening AKI from unknown cause. Scr increased from 1.01 > 1.35 > 1.47 in 2 days. No nephrotoxic medications given. CBC remains low but stable, Hgb 7.8, platelets 270. No overt signs/symptoms of bleeding noted and no reported issues with the heparin infusion.  Goal of Therapy:  INR 2.5-3.5 Monitor platelets by anticoagulation protocol: Yes   Plan:  Give warfarin 1 mg PO x 1 dose tonight Stop heparin infusion Check INR daily while on warfarin Continue to monitor H&H and platelets    Thank you for allowing pharmacy to be a part of this patients care.  4/05, PharmD Clinical Pharmacist

## 2021-05-18 NOTE — Progress Notes (Signed)
PROGRESS NOTE  Deborah [name] [id number] DOB: 1945/04/29 DOA: 05/05/2021 PCP: Merrilee Seashore, MD  HPI/Recap of past 37 hours: 76 year old female with prior history significant for HFrEF (4/22 EF 20-25%), AICD, HTN, congential MR s/p MVR [facility] [facility] on coumadin, HLD, DM, and anemia who presented to [facility] ER by EMS with altered mental status and weakness. Patient lives alone.  Her sister was unable to reach her, went to check on her and she was found on the bathroom floor.  EMS reports that she stated she had lost control of her bladder and sat on the floor to clean her self but was too weak to get up.  Denied falling, dizziness, syncope, etc.  Also reports not eating/ drinking for 16 hrs. Sister has noted a slow decline over the past several months.    Today, patient still very lethargic, poor oral intake.  Discussed extensively with son and daughter-in-law about hospice services.  Plan for family meeting on 05/19/2021 with palliative team for family to discuss more about hospice.  Had an assisted fall this evening, did not hit her head.    Assessment/Plan: Principal Problem:   Subdural hematoma without coma Active Problems:   Mitral valve disorder. (Status post mechanical valve replacement)   Chronic systolic CHF (congestive heart failure) (HCC)   AKI (acute kidney injury) (HCC)   Lactic acidosis   Warfarin-induced coagulopathy (HCC)   Failure to thrive in adult   Severe protein-calorie malnutrition (HCC)   Malnutrition of moderate degree   Subdural hematoma S/p MMA embolization on 1/28 Repeat CT head 1/30, 1/31 and 05/16/21 showed stable subdural hematomas, no evidence of acute hemorrhage Neurosurgery consulted Seizure precautions with keppra, completed on 05/18/2021 Continue PT/OT, currently recommendations for SNF, but patient is currently evaluated for hospice Delirium precautions  COVID-19 positive Incidental finding on admission On room air, resolved coughing Off  isolation precautions Supportive care with inhaler, cough suppressant prn, vitamins  Leukocytosis Remains afebrile Chest x-ray unremarkable, repeat UA unremarkable, UC no growth BC x2 NGTD, procalcitonin < 0.10 Abd xray showed average stool burden, otherwise negative Just completed 6 days of antibiotics on 05/16/2021, no current indication to restart antibiotics Daily CBC  Hypokalemia/hypomagnesemia Replace as needed   Combined systolic and diastolic HF Severe MR s/p mechanical MVR (mechanical heart valve) Appears euvolemic, on RA Echo on 05/07/2021 showed EF <20%, global hypokinesis Strict intake and output, daily weight INR goal 2.5-3.5 Telemetry  AKI on CKD3B Worsening (discussed with Dr. Haroldine Laws, okay to start gentle hydration for about 1 L maximum depending on patient's tolerance, he also agreed for palliative/hospice route) Baseline creatinine around 1.3-1.4 Start IV hydration at 50 cc/h Encourage oral hydration, appears dry Daily BMP  HTN/HLD BP stable  Continue carvedilol, Imdur hold hydralazine for now   Proteus mirabilis UTI Gross hematuria (ongoing microscopic for yrs due to warfarin) S/P IV ceftriaxone--> PO Keflex, completed 6 days on 05/16/21 Monitor   Warfarin-induced coagulopathy INR reversed with Kcentra S/P IV heparin bridging, INR at goal Continue Coumadin Daily INR (goal 2.5-3.5)  Macrocytic anemia Baseline hemoglobin around 8-10 Noted recurrent/persistent hematuria while on anticoagulation Anemia panel WNL Type and screen done Daily CBC   Protein calorie malnutrition Functional decline Continue diet supplementation Registered dietitian consulted   Hyperglycemia A1c 5.4 Continue SSI, accuchecks, hypoglycemic protocol   GOC / Dispo Plan for possible residential hospice placement Pt made DNR on 05/15/21 after discussion with son Palliative consult for further [place] discussion (overall very poor prognosis), meeting set up for  05/19/2021 to  discuss for possible residential hospice placement        Malnutrition Type:  Nutrition Problem: Moderate Malnutrition Etiology: chronic illness (CHF)   Malnutrition Characteristics:  Signs/Symptoms: mild fat depletion, mild muscle depletion, moderate muscle depletion   Nutrition Interventions:  Interventions: Ensure Enlive (each supplement provides 350kcal and 20 grams of protein), MVI, Liberalize Diet    Estimated body mass index is 23.98 kg/m as calculated from the following:   Height as of this encounter: 5\' 3"  (1.6 m).   Weight as of this encounter: 61.4 kg.     Code Status: DNR  Family Communication: Discussed with son on 05/18/21  Disposition Plan: Status is: Inpatient Remains inpatient appropriate because: Level of care     Consultants: PCCM Neurosurgery Heart failure/cardiology   Procedures: MMA embolization on 1/28  Antimicrobials: Completed  ceftriaxone/Keflex  DVT prophylaxis: Coumadin   Objective: Vitals:   05/17/21 2349 05/18/21 0411 05/18/21 1139 05/18/21 1458  BP: 105/80 113/74 118/84 117/75  Pulse: 72 84 93 64  Resp: 18 18 20 16   Temp: 99.4 F (37.4 C) 98.1 F (36.7 C) 98.3 F (36.8 C) 98.8 F (37.1 C)  TempSrc: Oral Oral Oral Axillary  SpO2: 96% 95% 95% 97%  Weight:      Height:        Intake/Output Summary (Last 24 hours) at 05/18/2021 1924 Last data filed at 05/17/2021 2000 Gross per 24 hour  Intake 47.26 ml  Output --  Net 47.26 ml    Filed Weights   05/05/21 1241 05/07/21 0600  Weight: 67.9 kg 61.4 kg    Exam: General: NAD, HOH Cardiovascular: S1, S2 present Respiratory: Diminished breath sounds bilaterally Abdomen: Soft, nontender, nondistended, bowel sounds present Musculoskeletal: No bilateral pedal edema noted Skin: Normal Psychiatry: Fair mood  Neurology: No obvious focal neurologic deficits noted, moving all extremities   Data Reviewed: CBC: Recent Labs  Lab 05/16/21 0107 05/17/21 0416  05/18/21 0314 05/18/21 0723 05/18/21 1144  WBC 13.6* 14.5* 14.3* 14.5* 14.4*  HGB 7.9* 8.0* 7.8* 7.6* 8.4*  HCT 24.6* 25.7* 24.7* 24.7* 27.5*  MCV 101.7* 103.2* 102.1* 103.3* 103.8*  PLT 330 288 270 285 [id number]   Basic Metabolic Panel: Recent Labs  Lab 05/14/21 0220 05/15/21 0323 05/16/21 0107 05/17/21 0416 05/18/21 0314  NA 138 138 139 135 137  K 3.7 3.5 3.4* 4.2 4.4  CL 106 104 104 101 104  CO2 25 24 25 24 25   GLUCOSE 119* 110* 103* 200* 103*  BUN 18 15 14 20  29*  CREATININE 1.10* 0.98 1.01* 1.35* 1.47*  CALCIUM 8.5* 8.6* 8.6* 8.6* 8.6*  MG  --   --  1.5* 2.0  --    GFR: Estimated Creatinine Clearance: 26.5 mL/min (A) (by C-G formula based on SCr of 1.47 mg/dL (H)). Liver Function Tests: No results for input(s): AST, ALT, ALKPHOS, BILITOT, PROT, ALBUMIN in the last 168 hours. No results for input(s): LIPASE, AMYLASE in the last 168 hours. No results for input(s): AMMONIA in the last 168 hours. Coagulation Profile: Recent Labs  Lab 05/14/21 0220 05/15/21 0323 05/16/21 0107 05/17/21 0416 05/18/21 0314  INR 1.3* 1.2 1.4* 1.8* 2.6*   Cardiac Enzymes: No results for input(s): CKTOTAL, CKMB, CKMBINDEX, TROPONINI in the last 168 hours. BNP (last 3 results) No results for input(s): PROBNP in the last 8760 hours. HbA1C: No results for input(s): HGBA1C in the last 72 hours. CBG: Recent Labs  Lab 05/17/21 1629 05/17/21 2148 05/18/21 0548 05/18/21 1137 05/18/21 1543  GLUCAP 174* 148* 95 80 102*   Lipid Profile: No results for input(s): CHOL, HDL, LDLCALC, TRIG, CHOLHDL, LDLDIRECT in the last 72 hours. Thyroid Function Tests: No results for input(s): TSH, T4TOTAL, FREET4, T3FREE, THYROIDAB in the last 72 hours. Anemia Panel: No results for input(s): VITAMINB12, FOLATE, FERRITIN, TIBC, IRON, RETICCTPCT in the last 72 hours.  Urine analysis:    Component Value Date/Time   COLORURINE YELLOW 05/18/2021 0715   APPEARANCEUR CLOUDY (A) 05/18/2021 0715   LABSPEC  >1.030 (H) 05/18/2021 0715   PHURINE 5.5 05/18/2021 0715   GLUCOSEU NEGATIVE 05/18/2021 0715   HGBUR LARGE (A) 05/18/2021 0715   BILIRUBINUR SMALL (A) 05/18/2021 0715   KETONESUR NEGATIVE 05/18/2021 0715   PROTEINUR >300 (A) 05/18/2021 0715   UROBILINOGEN 1.0 05/10/2011 1440   NITRITE NEGATIVE 05/18/2021 0715   LEUKOCYTESUR TRACE (A) 05/18/2021 0715   Sepsis Labs: @LABRCNTIP (procalcitonin:4,lacticidven:4)  ) Recent Results (from the past 240 hour(s))  Urine Culture     Status: Abnormal   Collection Time: 05/10/21  1:21 PM   Specimen: Urine, Catheterized  Result Value Ref Range Status   Specimen Description URINE, CATHETERIZED  Final   Special Requests   Final    NONE Performed at [facility], [place] [address], [place], [place] [address]    Culture >=100,000 COLONIES/mL PROTEUS MIRABILIS (A)  Final   Report Status 05/12/2021 FINAL  Final   Organism ID, Bacteria PROTEUS MIRABILIS (A)  Final      Susceptibility   Proteus mirabilis - MIC*    AMPICILLIN <=2 SENSITIVE Sensitive     CEFAZOLIN <=4 SENSITIVE Sensitive     CEFEPIME <=0.12 SENSITIVE Sensitive     CEFTRIAXONE <=0.25 SENSITIVE Sensitive     CIPROFLOXACIN <=0.25 SENSITIVE Sensitive     GENTAMICIN <=1 SENSITIVE Sensitive     IMIPENEM 1 SENSITIVE Sensitive     NITROFURANTOIN 128 RESISTANT Resistant     TRIMETH/SULFA <=20 SENSITIVE Sensitive     AMPICILLIN/SULBACTAM <=2 SENSITIVE Sensitive     PIP/TAZO <=4 SENSITIVE Sensitive     * >=100,000 COLONIES/mL PROTEUS MIRABILIS  Urine Culture     Status: None   Collection Time: 05/16/21  7:49 AM   Specimen: Urine, Clean Catch  Result Value Ref Range Status   Specimen Description URINE, CLEAN CATCH  Final   Special Requests NONE  Final   Culture   Final    NO GROWTH Performed at [facility], [place] [address], [place], [place] [address]    Report Status 05/17/2021 FINAL  Final  Culture, blood (routine x 2)     Status: None (Preliminary result)   Collection Time:  05/17/21  4:18 AM   Specimen: BLOOD  Result Value Ref Range Status   Specimen Description BLOOD LEFT ANTECUBITAL  Final   Special Requests   Final    BOTTLES DRAWN AEROBIC AND ANAEROBIC Blood Culture results may not be optimal due to an excessive volume of blood received in culture bottles   Culture   Final    NO GROWTH 1 DAY Performed at [facility], [place] [address], [place], [place] [address]    Report Status PENDING  Incomplete  Culture, blood (routine x 2)     Status: None (Preliminary result)   Collection Time: 05/17/21  4:26 AM   Specimen: BLOOD LEFT HAND  Result Value Ref Range Status   Specimen Description BLOOD LEFT HAND  Final   Special Requests   Final    BOTTLES DRAWN  AEROBIC AND ANAEROBIC Blood Culture adequate volume   Culture   Final    NO GROWTH 1 DAY Performed at [facility], [place] [address], [place], [place] [address]    Report Status PENDING  Incomplete      Studies: DG Abd Portable 1V  Result Date: 05/17/2021 CLINICAL DATA:  Constipation. EXAM: PORTABLE ABDOMEN - 1 VIEW COMPARISON:  Abdominal x-ray 12/14/2006. FINDINGS: The bowel gas pattern is normal. There is average stool burden. No radio-opaque calculi. Degenerative changes affect the hips and spine. IMPRESSION: 1. Nonobstructive bowel gas pattern. Electronically Signed   By: Ronney Asters M.D.   On: 05/17/2021 21:20    Scheduled Meds:  vitamin C  500 mg Oral Daily   atorvastatin  40 mg Oral Daily   carvedilol  12.5 mg Oral BID WC   Chlorhexidine Gluconate Cloth  6 each Topical Daily   feeding supplement  237 mL Oral TID BM   hydrALAZINE  25 mg Oral TID   insulin aspart  0-15 Units Subcutaneous TID WC   isosorbide mononitrate  30 mg Oral Daily   polyethylene glycol  17 g Oral Daily   sodium chloride flush  3 mL Intravenous Q12H   warfarin  1 mg Oral ONCE-1600   Warfarin - Pharmacist Dosing Inpatient   Does not apply q1600   zinc sulfate  220 mg Oral Daily    Continuous Infusions:   sodium chloride 50 mL/hr at 05/18/21 0939     LOS: 13 days     Alma Friendly, MD [facility]  If 7PM-7AM, please contact night-coverage [email or web address] 05/18/2021, 7:24 PM

## 2021-05-18 NOTE — TOC Progression Note (Addendum)
Transition of Care [facility]) - Progression Note    Patient Details  Name: TALAYLA DOYEL MRN: [id number] Date of Birth: [birth year]  Transition of Care [facility]) CM/SW Contact  Jayana Kotula, LCSW Phone Number: 05/18/2021, 9:31 AM  Clinical Narrative:    [facility] extended a bed offer for Ms. Ogborn. Insurance authorization approved [facility] ID#: [id number] for SNF from 05/15/2021-05/18/2021 status: Approved. Insurance authorization submitted last week and facility choice updated on Friday 05/14/21. HF CSW reached out to the attending MD to ask about medical readiness and disposition, awaiting a response. HF CSW spoke with the patient's son [name] and informed him that [facility] has extended a bed offer and he is pleased with this and stated that he doesn't believe she will discharge today after speaking with the doctor today due to her INR.  CSW will continue to follow throughout discharge.   Expected Discharge Plan: Skilled Nursing Facility Barriers to Discharge: Continued Medical Work up  Expected Discharge Plan and Services Expected Discharge Plan: Skilled Nursing Facility In-house Referral: Clinical Social Work Discharge Planning Services: CM Consult Post Acute Care Choice: Skilled Nursing Facility Living arrangements for the past 2 months: Apartment                                       Social Determinants of Health (SDOH) Interventions    Readmission Risk Interventions No flowsheet data found.  Benedicto Capozzi, MSW, LCSWA [phone] Heart Failure Social Worker

## 2021-05-19 DIAGNOSIS — Z7189 Other specified counseling: Secondary | ICD-10-CM

## 2021-05-19 LAB — BASIC METABOLIC PANEL
Anion gap: 11 (ref 5–15)
BUN: 32 mg/dL — ABNORMAL HIGH (ref 8–23)
CO2: 23 mmol/L (ref 22–32)
Calcium: 8.2 mg/dL — ABNORMAL LOW (ref 8.9–10.3)
Chloride: 104 mmol/L (ref 98–111)
Creatinine, Ser: 1.52 mg/dL — ABNORMAL HIGH (ref 0.44–1.00)
GFR, Estimated: 35 mL/min — ABNORMAL LOW (ref >60)
Glucose, Bld: 92 mg/dL (ref 70–99)
Potassium: 4.1 mmol/L (ref 3.5–5.1)
Sodium: 138 mmol/L (ref 135–145)

## 2021-05-19 LAB — GLUCOSE, CAPILLARY
Glucose-Capillary: 85 mg/dL (ref 70–99)
Glucose-Capillary: 139 mg/dL — ABNORMAL HIGH (ref 70–99)
Glucose-Capillary: 127 mg/dL — ABNORMAL HIGH (ref 70–99)

## 2021-05-19 LAB — CBC
HCT: 23.3 % — ABNORMAL LOW (ref 36.0–46.0)
Hemoglobin: 7.1 g/dL — ABNORMAL LOW (ref 12.0–15.0)
MCH: 31.6 pg (ref 26.0–34.0)
MCHC: 30.5 g/dL (ref 30.0–36.0)
MCV: 103.6 fL — ABNORMAL HIGH (ref 80.0–100.0)
Platelets: 266 10*3/uL (ref 150–400)
RBC: 2.25 MIL/uL — ABNORMAL LOW (ref 3.87–5.11)
RDW: 15.4 % (ref 11.5–15.5)
WBC: 12.2 10*3/uL — ABNORMAL HIGH (ref 4.0–10.5)
nRBC: 0.6 % — ABNORMAL HIGH (ref 0.0–0.2)

## 2021-05-19 LAB — PROTIME-INR
INR: 2.5 — ABNORMAL HIGH (ref 0.8–1.2)
Prothrombin Time: 26.8 seconds — ABNORMAL HIGH (ref 11.4–15.2)

## 2021-05-19 LAB — TYPE AND SCREEN
ABO/RH(D): O POS
Antibody Screen: NEGATIVE

## 2021-05-19 MED ORDER — GLYCOPYRROLATE 0.2 MG/ML IJ SOLN: 0.2000 mg | INTRAMUSCULAR | Status: DC | PRN

## 2021-05-19 MED ORDER — HALOPERIDOL LACTATE 2 MG/ML PO CONC
0.5000 mg | ORAL | Status: DC | PRN
  Filled 2021-05-19: qty 0.3

## 2021-05-19 MED ORDER — HALOPERIDOL 0.5 MG PO TABS: 0.5000 mg | ORAL_TABLET | ORAL | Status: DC | PRN

## 2021-05-19 MED ORDER — LORAZEPAM 1 MG PO TABS: 1.0000 mg | ORAL_TABLET | ORAL | Status: DC | PRN

## 2021-05-19 MED ORDER — HYDROMORPHONE HCL 1 MG/ML IJ SOLN
0.2500 mg | Freq: Four times a day (QID) | INTRAMUSCULAR | Status: DC
  Administered 2021-05-19 – 2021-05-21 (×6): 0.25 mg via INTRAVENOUS
  Filled 2021-05-19 (×6): qty 0.5

## 2021-05-19 MED ORDER — WARFARIN SODIUM 5 MG PO TABS: 5.0000 mg | ORAL_TABLET | Freq: Once | ORAL | Status: DC

## 2021-05-19 MED ORDER — LORAZEPAM 2 MG/ML IJ SOLN: 1.0000 mg | INTRAMUSCULAR | Status: DC | PRN

## 2021-05-19 MED ORDER — HALOPERIDOL LACTATE 5 MG/ML IJ SOLN: 0.5000 mg | INTRAMUSCULAR | Status: DC | PRN

## 2021-05-19 MED ORDER — GLYCOPYRROLATE 1 MG PO TABS
1.0000 mg | ORAL_TABLET | ORAL | Status: DC | PRN
  Filled 2021-05-19: qty 1

## 2021-05-19 MED ORDER — POLYVINYL ALCOHOL 1.4 % OP SOLN
1.0000 [drp] | Freq: Four times a day (QID) | OPHTHALMIC | Status: DC | PRN
  Filled 2021-05-19: qty 15

## 2021-05-19 MED ORDER — BIOTENE DRY MOUTH MT LIQD: 15.0000 mL | OROMUCOSAL | Status: DC | PRN

## 2021-05-19 MED ORDER — LORAZEPAM 2 MG/ML PO CONC: 1.0000 mg | ORAL | Status: DC | PRN

## 2021-05-19 NOTE — Progress Notes (Signed)
Patient more awake and alert today than I have seen her since admission. She is interactive. Family at bedside reported that hospice is involved now. They do not wish to pursue any aggressive treatment. Will sign off for now.

## 2021-05-19 NOTE — TOC Progression Note (Addendum)
Transition of Care [facility]) - Progression Note    Patient Details  Name: Deborah [name] MRN: [id number] Date of Birth: 1945/11/25  Transition of Care [facility]) CM/SW Contact  Amado Andal, LCSW Phone Number: 05/19/2021, 2:42 PM  Clinical Narrative:    HF CSW spoke with Deborah [name] and offered choice per the palliative consult for the plan to transition to full comfort measures only and family requesting [facility]. 2:39pm - HF CSW called [employer] and had to leave a voicemail for Clarene Critchley to return the call. CSW reached out to [phone] and left a voicemail for Fabio Pierce to return the call for the [facility] referral. CSW also sent a secure chat to [facility] referral.  CSW will continue to follow throughout discharge.   Expected Discharge Plan: [place] Barriers to Discharge: Continued Medical Work up  Expected Discharge Plan and Services Expected Discharge Plan: [place] In-house Referral: Clinical Social Work Discharge Planning Services: CM Consult Post Acute Care Choice: [place] Living arrangements for the past 2 months: Apartment                                       Social Determinants of Health (SDOH) Interventions    Readmission Risk Interventions No flowsheet data found.  Mercie Balsley, MSW, LCSW [phone] Heart Failure Social Worker

## 2021-05-19 NOTE — Progress Notes (Signed)
[facility]                                                                               Deborah [name], is a 76 y.o. female, DOB - 11-Feb-1946, [id number] Admit date - 05/05/2021    Outpatient Primary MD for the patient is Deborah Fick, MD  LOS - 14  days    Brief summary   76 year old female with prior history significant for HFrEF (4/22 EF 20-25%), AICD, HTN, congential MR s/p MVR [facility] [facility] on coumadin, HLD, DM, and anemia who presented to [facility] ER by EMS with altered mental status and weakness. Patient lives alone.  Her sister was unable to reach her, went to check on her and she was found on the bathroom floor.  EMS reports that she stated she had lost control of her bladder and sat on the floor to clean her self but was too weak to get up.  Denied falling, dizziness, syncope, etc.  Also reports not eating/ drinking for 16 hrs. Sister has noted a slow decline over the past several months.     Assessment & Plan    Assessment and Plan: * Subdural hematoma without coma- (present on admission) S/p MMA embolization on 1/28 Repeat CT head 1/30, 1/31 and 05/16/21 showed stable subdural hematomas, no evidence of acute hemorrhage Neurosurgery consulted Seizure precautions with keppra, completed on 05/18/2021. Pt contined to decline and was transitioned to comfort measures.   Severe protein-calorie malnutrition (HCC)- (present on admission) Decline in nutritional status and cognitive decline.   Failure to thrive in adult- (present on admission) Palliative care consulted and plan for comfort measures.   AKI (acute kidney injury) (HCC)- (present on admission) Baseline creatinine around 1.3-1.4. avoid nephrotoxins.    Chronic systolic CHF (congestive heart failure) (HCC)- (present on admission) Pt is on RA. Denies any sob .  Further decline, transition to comfort measures and residential hospice  HYPERTENSION, BENIGN- (present on admission) Optimal.         Malnutrition Type:  Nutrition Problem: Moderate Malnutrition Etiology: chronic illness (CHF)  Nutrition Interventions:  Interventions: Ensure Enlive (each supplement provides 350kcal and 20 grams of protein), MVI, Liberalize Diet  Estimated body mass index is 23.98 kg/m as calculated from the following:   Height as of this encounter: 5\' 3"  (1.6 m).   Weight as of this encounter: 61.4 kg.  Code Status: DNR DVT Prophylaxis:     Level of Care: Level of care: Palliative Care Family Communication: NONE AT Bedside  Disposition Plan:     Remains inpatient appropriate:  unsafe d/c plan.   Procedures:  None.   Consultants:   Palliative care.   Antimicrobials:   Anti-infectives (From admission, onward)    Start     Dose/Rate Route Frequency Ordered Stop   05/16/21 1200  cefTRIAXone (ROCEPHIN) 1 g in sodium chloride 0.9 % 100 mL IVPB  Status:  Discontinued        1 g 200 mL/hr over 30 Minutes Intravenous Every 24 hours 05/16/21 1045 05/16/21 1449   05/13/21 2200  cephALEXin (KEFLEX) capsule 500 mg        500 mg Oral Every 12 hours  05/13/21 1412 05/14/21 2124   05/13/21 0000  cephALEXin (KEFLEX) capsule 500 mg  Status:  Discontinued        500 mg Oral Every 6 hours 05/12/21 0959 05/13/21 1944   05/10/21 1115  cefTRIAXone (ROCEPHIN) 1 g in sodium chloride 0.9 % 100 mL IVPB  Status:  Discontinued        1 g 200 mL/hr over 30 Minutes Intravenous Every 24 hours 05/10/21 1028 05/12/21 0959        Medications  Scheduled Meds:  Chlorhexidine Gluconate Cloth  6 each Topical Daily    HYDROmorphone (DILAUDID) injection  0.25 mg Intravenous Q6H   polyethylene glycol  17 g Oral Daily   sodium chloride flush  3 mL Intravenous Q12H   Continuous Infusions: PRN Meds:.acetaminophen, antiseptic oral rinse, dextromethorphan-guaiFENesin, glycopyrrolate **OR** glycopyrrolate **OR** glycopyrrolate, haloperidol **OR** haloperidol **OR** haloperidol lactate, LORazepam **OR** LORazepam  **OR** LORazepam, ondansetron (ZOFRAN) IV, polyvinyl alcohol    Subjective:   Deborah [name] was seen and examined today.  Pt alert and appears comfortable.   Objective:   Vitals:   05/18/21 2348 05/19/21 0309 05/19/21 0859 05/19/21 1244  BP: 100/60 116/69 108/61 (!) 95/52  Pulse: 69 66 68 72  Resp: 18 16 20 16   Temp: 98.5 F (36.9 C) 98.2 F (36.8 C) 98 F (36.7 C) 98.4 F (36.9 C)  TempSrc: Axillary Oral Oral Oral  SpO2: 97% 93% 95% 100%  Weight:      Height:        Intake/Output Summary (Last 24 hours) at 05/19/2021 1500 Last data filed at 05/19/2021 1232 Gross per 24 hour  Intake 982.26 ml  Output 400 ml  Net 582.26 ml   Filed Weights   05/05/21 1241 05/07/21 0600  Weight: 67.9 kg 61.4 kg     Exam General: Alert and comfortable.  Cardiovascular: S1 S2 auscultated, no murmurs, RRR Respiratory: Clear to auscultation bilaterally, no wheezing, rales or rhonchi Gastrointestinal: Soft, nontender, nondistended, + bowel sounds Ext: no pedal edema bilaterally Neuro:alert and comfortable.  Skin: No rashes Psych: cannot be assessed   Data Reviewed:  I have personally reviewed following labs and imaging studies   CBC Lab Results  Component Value Date   WBC 12.2 (H) 05/19/2021   RBC 2.25 (L) 05/19/2021   HGB 7.1 (L) 05/19/2021   HCT 23.3 (L) 05/19/2021   MCV 103.6 (H) 05/19/2021   MCH 31.6 05/19/2021   PLT 266 05/19/2021   MCHC 30.5 05/19/2021   RDW 15.4 05/19/2021   LYMPHSABS 0.6 (L) 05/05/2021   MONOABS 1.0 05/05/2021   EOSABS 0.0 05/05/2021   BASOSABS 0.0 05/05/2021     Last metabolic panel Lab Results  Component Value Date   NA 138 05/19/2021   K 4.1 05/19/2021   CL 104 05/19/2021   CO2 23 05/19/2021   BUN 32 (H) 05/19/2021   CREATININE 1.52 (H) 05/19/2021   GLUCOSE 92 05/19/2021   GFRNONAA 35 (L) 05/19/2021   GFRAA 53 (L) 02/01/2019   CALCIUM 8.2 (L) 05/19/2021   PHOS 2.6 07/22/2020   PROT 6.3 (L) 05/05/2021   ALBUMIN 3.2 (L) 05/05/2021    BILITOT 1.2 05/05/2021   ALKPHOS 65 05/05/2021   AST 40 05/05/2021   ALT 24 05/05/2021   ANIONGAP 11 05/19/2021    CBG (last 3)  Recent Labs    05/18/21 2124 05/19/21 0645 05/19/21 1248  GLUCAP 92 85 139*      Coagulation Profile: Recent Labs  Lab 05/15/21 0323 05/16/21 0107 05/17/21 0416  05/18/21 0314 05/19/21 0104  INR 1.2 1.4* 1.8* 2.6* 2.5*     Radiology Studies: DG Abd Portable 1V  Result Date: 05/17/2021 CLINICAL DATA:  Constipation. EXAM: PORTABLE ABDOMEN - 1 VIEW COMPARISON:  Abdominal x-ray 12/14/2006. FINDINGS: The bowel gas pattern is normal. There is average stool burden. No radio-opaque calculi. Degenerative changes affect the hips and spine. IMPRESSION: 1. Nonobstructive bowel gas pattern. Electronically Signed   By: Darliss Cheney M.D.   On: 05/17/2021 21:20       Kathlen Mody M.D. [facility] 05/19/2021, 3:00 PM  Available via Epic secure chat 7am-7pm After 7 pm, please refer to night coverage provider listed on amion.

## 2021-05-19 NOTE — Assessment & Plan Note (Signed)
Decline in nutritional status and cognitive decline.

## 2021-05-19 NOTE — TOC Progression Note (Addendum)
Transition of Care [facility]) - Progression Note    Patient Details  Name: CHEALSEA NORTON MRN: [id number] Date of Birth: 09-Nov-1945  [facility]) CM/SW Contact  Adrea Sherpa, LCSW Phone Number: 05/19/2021, 10:11 AM  Clinical Narrative:    Insurance authorization expired for SNF. CSW to re-start insurance authorization upon medical readiness for discharge. CSW spoke with the attending MD regarding disposition.  CSW will continue to follow throughout discharge.   Expected Discharge Plan: [place] Barriers to Discharge: Continued Medical Work up  Expected Discharge Plan and Services Expected Discharge Plan: [place] In-house Referral: Clinical Social Work Discharge Planning Services: CM Consult Post Acute Care Choice: [place] Living arrangements for the past 2 months: Apartment                                       Social Determinants of Health (SDOH) Interventions    Readmission Risk Interventions No flowsheet data found.  Shaquana Buel, MSW, LCSW [phone] Heart Failure Social Worker

## 2021-05-19 NOTE — Assessment & Plan Note (Signed)
Baseline creatinine around 1.3-1.4. avoid nephrotoxins.

## 2021-05-19 NOTE — Assessment & Plan Note (Signed)
Optimal

## 2021-05-19 NOTE — Assessment & Plan Note (Signed)
Palliative care consulted and plan for comfort measures.

## 2021-05-19 NOTE — Assessment & Plan Note (Signed)
S/p MMA embolization on 1/28 Repeat CT head 1/30, 1/31 and 05/16/21 showed stable subdural hematomas, no evidence of acute hemorrhage Neurosurgery consulted Seizure precautions with keppra, completed on 05/18/2021. Pt contined to decline and was transitioned to comfort measures.

## 2021-05-19 NOTE — Progress Notes (Signed)
OT Cancellation Note  Patient Details Name: YASLENE LINDAMOOD MRN: [id number] DOB: 10-05-1945   Cancelled Treatment:    Reason Eval/Treat Not Completed:  (Pt family has decided to transition to comfort care. OT to sign off.)  Minahil Quinlivan A Yousof Alderman 05/19/2021, 1:58 PM

## 2021-05-19 NOTE — Assessment & Plan Note (Signed)
Pt is on RA. Denies any sob .  Further decline, transition to comfort measures and residential hospice

## 2021-05-19 NOTE — Progress Notes (Signed)
Physical Therapy Treatment Patient Details Name: Deborah [name] MRN: [id number] DOB: 1945/11/01 Today's Date: 05/19/2021   History of Present Illness 76 yo admitted 1/25 after found on bathroom floor by family. Pt with bifrontal and acute parafalcine SDH, pt denies fall. s/p 1/27 Onyx embolization of bilateral middle meningeal arteries PMHx: CHF, FTT, HOH, DM, HLD, HTN    PT Comments    Pt able to be aroused more easily today, having recently sat up EOB.  Emphasis on transitions with less physical assist, scooting, sit to stand technique and safety, progression of gait  with the RW.    Recommendations for follow up therapy are one component of a multi-disciplinary discharge planning process, led by the attending physician.  Recommendations may be updated based on patient status, additional functional criteria and insurance authorization.  Follow Up Recommendations  Skilled nursing-short term rehab (<3 hours/day)     Assistance Recommended at Discharge Frequent or constant Supervision/Assistance  Patient can return home with the following A little help with walking and/or transfers;Help with stairs or ramp for entrance;Assistance with cooking/housework;Direct supervision/assist for financial management;Direct supervision/assist for medications management;Assist for transportation   Equipment Recommendations  Rolling walker (2 wheels);BSC/3in1    Recommendations for Other Services       Precautions / Restrictions       Mobility  Bed Mobility Overal bed mobility: Needs Assistance Bed Mobility: Supine to Sit     Supine to sit: Mod assist     General bed mobility comments: cues for direction and to arouse pt,  up via R elbow.    Transfers Overall transfer level: Needs assistance Equipment used: Rolling walker (2 wheels) Transfers: Sit to/from Stand Sit to Stand: Mod assist           General transfer comment: cues for direction.  assist forward and to boost.     Ambulation/Gait Ambulation/Gait assistance: Min assist, +2 safety/equipment Gait Distance (Feet): 130 Feet Assistive device: Rolling walker (2 wheels) Gait Pattern/deviations: Step-through pattern, Decreased step length - right, Decreased step length - left, Decreased stride length Gait velocity: slower Gait velocity interpretation: <1.8 ft/sec, indicate of risk for recurrent falls   General Gait Details: pt more alert and participative, improved posture and proximity to the RW with VC.  gait slower and generally steady.   Stairs             Wheelchair Mobility    Modified Rankin (Stroke Patients Only) Modified Rankin (Stroke Patients Only) Modified Rankin: Moderately severe disability     Balance Overall balance assessment: Needs assistance Sitting-balance support: No upper extremity supported, Feet supported Sitting balance-Leahy Scale: Fair       Standing balance-Leahy Scale: Poor Standing balance comment: still needing AD and or external stability assist                            Cognition Arousal/Alertness: Lethargic, Awake/alert Behavior During Therapy: Flat affect Overall Cognitive Status: Impaired/Different from baseline (NT formally tested)                                          Exercises      General Comments        Pertinent Vitals/Pain Pain Assessment Pain Assessment: No/denies pain Faces Pain Scale: No hurt Pain Intervention(s): Monitored during session    Home Living  Prior Function            PT Goals (current goals can now be found in the care plan section) Acute Rehab PT Goals PT Goal Formulation: With patient/family Time For Goal Achievement: 05/20/21 Potential to Achieve Goals: Fair Progress towards PT goals: Progressing toward goals    Frequency    Min 3X/week      PT Plan Current plan remains appropriate    Co-evaluation               AM-PAC PT "6 Clicks" Mobility   Outcome Measure  Help needed turning from your back to your side while in a flat bed without using bedrails?: A Little Help needed moving from lying on your back to sitting on the side of a flat bed without using bedrails?: A Lot Help needed moving to and from a bed to a chair (including a wheelchair)?: A Lot Help needed standing up from a chair using your arms (e.g., wheelchair or bedside chair)?: A Lot Help needed to walk in hospital room?: A Little Help needed climbing 3-5 steps with a railing? : A Lot 6 Click Score: 14    End of Session   Activity Tolerance: Patient tolerated treatment well Patient left: in chair;with call bell/phone within reach;with chair alarm set Nurse Communication: Mobility status PT Visit Diagnosis: Other abnormalities of gait and mobility (R26.89);Difficulty in walking, not elsewhere classified (R26.2);Muscle weakness (generalized) (M62.81)     Time: 1062-6948 PT Time Calculation (min) (ACUTE ONLY): 18 min  Charges:  $Gait Training: 8-22 mins                     05/19/2021  Deborah [name]., PT Acute Rehabilitation Services [phone]  (pager) [phone]  (office)   Deborah [name] 05/19/2021, 12:50 PM

## 2021-05-19 NOTE — Progress Notes (Signed)
Nutrition Brief Note ° °Chart reviewed. °Pt now transitioning to comfort care.  °No further nutrition interventions planned at this time.  °Please re-consult as needed.  ° ° °Karver Fadden A., MS, RD, LDN (she/her/hers) °RD pager number and weekend/on-call pager number located in Amion. ° ° °

## 2021-05-19 NOTE — Progress Notes (Signed)
Civil engineer, contracting [facility]) Hospital Liaison Note   Referral received for interest in [facility]. Family confirmed interest. Chart under review by [facility] physican. [facility] liaison will evaluate patient tomorrow morning at bedside.   Please call with any questions or concerns. Thank you.    Lynder Parents [facility] Liaison  [phone]

## 2021-05-19 NOTE — Progress Notes (Signed)
Consultation Note Date: 05/19/2021   Patient Name: Deborah [name]  DOB: 05/16/1945  MRN: [id number]  Age / Sex: 76 y.o., female  PCP: Deborah Seashore, MD Referring Physician: Hosie Poisson, MD  Reason for Consultation: Goals of care  HPI/Patient Profile: 77 y.o. female  with past medical history of CHF EF 20%, HTN, MVR, HLD, DM, anemia admitted on 05/05/2021 after being found down in her home with altered mental status. Workup revealed subdural hematomas, Covid 19 +, AKI on CKD- UTI, she underwent MMA embolization on 1/28 and initially had some recovery in her mental and clinical status. However, over the last several days she has become increasingly lethargic. She is not eating or drinking. She is difficult to arouse. Her Cr is trending back up.  Palliative reconsulted for goals of care- family interested in Hospice.   Primary Decision Maker NEXT OF KIN- patient's son- Deborah [name]  Discussion: Met with Deborah [name] and his spouse Deborah [name].  They agree that [name] is declining and not improving. She is not arousing enough to participate independent with PT. She takes bites and sips with Deborah [name] makes her, but otherwise is not taking in nutrition on her own.  We discussed the differences in continued medical treatments with goal of prolonging her body vs transition to comfort focused care, dc labs, meds and interventions that are not affecting comfort and implementing symptom management for pain, SOB, anxiety or other end of life discomforts.  Deborah [name] and Deborah [name] both agree that Rion would choose a comfort focused path of care.  They had questions regarding assistance with completing a financial power of attorney- we discussed that [name]'s mental status likely precludes her being able to consent to completing that document, and also the hospital unfortunately can't provide that service.      SUMMARY OF RECOMMENDATIONS  -Pt  with subdural hematomas, initially with some improvement in mentals status, however, has declined significantly in function, cognition and nutrition in the last several days -Plan for transition to full comfort measures only -Family would like to request inpatient Hospice     Code Status/Advance Care Planning: DNR   Prognosis:   < 2 weeks  Discharge Planning: Hospice facility  Primary Diagnoses: Present on Admission:  Subdural hematoma without coma  Warfarin-induced coagulopathy (HCC)  Lactic acidosis  AKI (acute kidney injury) ([place])  Chronic systolic CHF (congestive heart failure) ([place])  Failure to thrive in adult  Severe protein-calorie malnutrition ([place])  Mitral valve disorder. (Status post mechanical valve replacement)   Review of Systems  Physical Exam  Vital Signs: BP 108/61 (BP Location: Left Arm)    Pulse 68    Temp 98 F (36.7 C) (Oral)    Resp 20    Ht _0  (1.6 m)    Wt 61.4 kg    SpO2 95%    BMI 23.98 kg/m  Pain Scale: 0-10   Pain Score: Asleep   SpO2: SpO2: 95 % O2 Device:SpO2: 95 % O2 Flow Rate: .O2 Flow Rate (L/min): 2 L/min  IO: Intake/output summary:  Intake/Output Summary (Last 24 hours) at 05/19/2021 1204 Last data filed at 05/19/2021 1031 Gross per 24 hour  Intake 982.26 ml  Output 300 ml  Net 682.26 ml    LBM: Last BM Date: 05/18/21 (BM on night shift per report) Baseline Weight: Weight: 67.9 kg Most recent weight: Weight: 61.4 kg     Palliative Assessment/Data: PPS: 20%       Thank you for this consult. Palliative medicine will continue to follow and assist as needed.   Time Total: 105 minutes Greater than 50%  of this time was spent counseling and coordinating care related to the above assessment and plan.  Signed by: Mariana Kaufman, AGNP-C Palliative Medicine    Please contact Palliative Medicine Team phone at [phone] for questions and concerns.  For individual provider: See Shea Evans

## 2021-05-20 DIAGNOSIS — I10 Essential (primary) hypertension: Secondary | ICD-10-CM

## 2021-05-20 DIAGNOSIS — S065X0D Traumatic subdural hemorrhage without loss of consciousness, subsequent encounter: Secondary | ICD-10-CM

## 2021-05-20 NOTE — Progress Notes (Addendum)
Daily Progress Note   Patient Name: Deborah [name]       Date: 05/20/2021 DOB: 09/15/1945  Age: 76 y.o. MRN#: [id number] Attending Physician: Kathlen Mody, MD Primary Care Physician: Georgianne Fick, MD Admit Date: 05/05/2021  Reason for Consultation/Follow-up: Establishing goals of care  Patient Profile/HPI:  76 y.o. female  with past medical history of CHF EF 20%, HTN, MVR, HLD, DM, anemia admitted on 05/05/2021 after being found down in her home with altered mental status. Workup revealed subdural hematomas, Covid 19 +, AKI on CKD- UTI, she underwent MMA embolization on 1/28 and initially had some recovery in her mental and clinical status. However, over the last several days she has become increasingly lethargic. She is not eating or drinking. She is difficult to arouse. Her Cr is trending back up.  Palliative reconsulted for goals of care- family interested in Hospice.   Subjective: [name] arouses with some effort. She is very lethargic. Per chart she was more awake and interactive last night.  Her breakfast tray is untouched. She accepted one bite of banana, no more after that. No po intake yesterday.  She is able to tell me she is in the hospital, and the year is 2023, otherwise not able to participate in complicated discussion. She drifts back to sleep in between questions.  She can squeeze my hand, but cannot lift her legs.  Spoke with patient's daughter in law Pam. She notes that while patient was awake yesterday   Review of Systems  Unable to perform ROS: Acuity of condition    Physical Exam Vitals and nursing note reviewed.  Constitutional:      Appearance: She is ill-appearing.     Comments: Frail, cachectic  Cardiovascular:     Rate and Rhythm: Normal rate.  Pulmonary:      Effort: Pulmonary effort is normal.  Abdominal:     General: Abdomen is flat.  Skin:    General: Skin is warm and dry.  Neurological:     Motor: Weakness present.     Comments: lethargic            Vital Signs: BP 101/67 (BP Location: Right Arm)    Pulse 74    Temp 98 F (36.7 C) (Oral)    Resp 20    Ht 5\' 3"  (1.6 m)  Wt 61.4 kg    SpO2 92%    BMI 23.98 kg/m  SpO2: SpO2: 92 % O2 Device: O2 Device: Room Air O2 Flow Rate: O2 Flow Rate (L/min): 2 L/min  Intake/output summary:  Intake/Output Summary (Last 24 hours) at 05/20/2021 1021 Last data filed at 05/19/2021 1232 Gross per 24 hour  Intake --  Output 250 ml  Net -250 ml   LBM: Last BM Date: 05/18/21 Baseline Weight: Weight: 67.9 kg Most recent weight: Weight: 61.4 kg       Palliative Assessment/Data: PPS: 20%      Patient Active Problem List   Diagnosis Date Noted   Malnutrition of moderate degree 05/06/2021   Subdural hematoma without coma 05/05/2021   Failure to thrive in adult 05/05/2021   Severe protein-calorie malnutrition (HCC) 05/05/2021   Recurrent falls 05/05/2021   Warfarin-induced coagulopathy (HCC)    Acute on chronic anemia 07/21/2020   AKI (acute kidney injury) (HCC) 07/21/2020   Lactic acidosis 07/21/2020   Diabetes type 2, controlled (HCC)    Chronic systolic CHF (congestive heart failure) (HCC)    Pulmonary hypertension (HCC)    HLD (hyperlipidemia)    ICD-St.[place]ude 08/02/2010   COPD, MILD 11/19/2009   Aortic valve disorder 07/30/2008   HYPERTENSION, BENIGN 01/24/2008   Mitral valve disorder. (Status post mechanical valve replacement) 01/24/2008    Palliative Care Assessment & Plan    Assessment/Recommendations/Plan  Bilateral subdural hematomas- initially improved after MME embolization, however, mental status has worsened- waxing and waning, not eating or drinking, unable to get out of bed AKI on CKD - Cr started to trend back up with patient's poor po intake- family elected comfort  measures only and request residential hospice evaluation Continue current plan of care- comfort measures only, no further labs   Code Status: DNR  Prognosis:  < 2 weeks  Discharge Planning: Hospice facility pending evaluation and bed availability  Care plan was discussed with patient's family and care team.  Thank you for allowing the Palliative Medicine Team to assist in the care of this patient.  Ocie Bob, AGNP-C Palliative Medicine   Please contact Palliative Medicine Team phone at [phone] for questions and concerns.

## 2021-05-20 NOTE — Progress Notes (Signed)
Civil engineer, contracting [facility]) Hospital Liaison note.    Received request from [facility] manager for family interest in [facility]. Chart and pt information under review by [facility] physician.    Hospice eligibility confirmed.   [facility] is unable to offer a room today. Hospital Liaison will follow up tomorrow or sooner if a room becomes available. Please do not hesitate to call with questions.    Lynder Parents [facility] Liaison [phone]

## 2021-05-20 NOTE — Progress Notes (Addendum)
AICD deactivated by Darrol Angel Jude Rep per order, at 1500.

## 2021-05-20 NOTE — Progress Notes (Signed)
[facility]                                                                               Deborah [name], is a 76 y.o. female, DOB - June 20, 1945, [id number] Admit date - 05/05/2021    Outpatient Primary MD for the patient is Merrilee Seashore, MD  LOS - 15  days    Brief summary   76 year old female with prior history significant for HFrEF (4/22 EF 20-25%), AICD, HTN, congential MR s/p MVR [place] on coumadin, HLD, DM, and anemia who presented to [facility] ER by EMS with altered mental status and weakness. Patient lives alone.  Her sister was unable to reach her, went to check on her and she was found on the bathroom floor.  EMS reports that she stated she had lost control of her bladder and sat on the floor to clean her self but was too weak to get up.  Denied falling, dizziness, syncope, etc.  Also reports not eating/ drinking for 16 hrs. Sister has noted a slow decline over the past several months.     Assessment & Plan    Assessment and Plan: * Subdural hematoma without coma- (present on admission) S/p MMA embolization on 1/28 Repeat CT head 1/30, 1/31 and 05/16/21 showed stable subdural hematomas, no evidence of acute hemorrhage Neurosurgery consulted Seizure precautions with keppra, completed on 05/18/2021. Pt contined to decline and was transitioned to comfort measures.   Severe protein-calorie malnutrition ([place])- (present on admission) Decline in nutritional status and cognitive decline.   Failure to thrive in adult- (present on admission) Palliative care consulted and plan for comfort measures.   AKI (acute kidney injury) ([place])- (present on admission) Baseline creatinine around 1.3-1.4. avoid nephrotoxins.    Chronic systolic CHF (congestive heart failure) ([place])- (present on admission) Pt is on RA. Denies any sob .  Further decline, transition to comfort measures and residential hospice  HYPERTENSION, BENIGN- (present on admission) Optimal.    Pt  continues to decline, and she is transitioned to comfort measures.     Malnutrition Type:  Nutrition Problem: Moderate Malnutrition Etiology: chronic illness (CHF)  Nutrition Interventions:  Interventions: Ensure Enlive (each supplement provides 350kcal and 20 grams of protein), MVI, Liberalize Diet  Estimated body mass index is 23.98 kg/m as calculated from the following:   Height as of this encounter: 5\' 3"  (1.6 m).   Weight as of this encounter: 61.4 kg.  Code Status: DNR DVT Prophylaxis:     Level of Care: Level of care: Palliative Care Family Communication: NONE AT Bedside  Disposition Plan:     Remains inpatient appropriate:  unsafe d/c plan.   Procedures:  None.   Consultants:   Palliative care.   Antimicrobials:   Anti-infectives (From admission, onward)    Start     Dose/Rate Route Frequency Ordered Stop   05/16/21 1200  cefTRIAXone (ROCEPHIN) 1 g in sodium chloride 0.9 % 100 mL IVPB  Status:  Discontinued        1 g 200 mL/hr over 30 Minutes Intravenous Every 24 hours 05/16/21 1045 05/16/21 1449   05/13/21 2200  cephALEXin (KEFLEX) capsule 500 mg  500 mg Oral Every 12 hours 05/13/21 1412 05/14/21 2124   05/13/21 0000  cephALEXin (KEFLEX) capsule 500 mg  Status:  Discontinued        500 mg Oral Every 6 hours 05/12/21 0959 05/13/21 1944   05/10/21 1115  cefTRIAXone (ROCEPHIN) 1 g in sodium chloride 0.9 % 100 mL IVPB  Status:  Discontinued        1 g 200 mL/hr over 30 Minutes Intravenous Every 24 hours 05/10/21 1028 05/12/21 0959        Medications  Scheduled Meds:   HYDROmorphone (DILAUDID) injection  0.25 mg Intravenous Q6H   polyethylene glycol  17 g Oral Daily   sodium chloride flush  3 mL Intravenous Q12H   Continuous Infusions: PRN Meds:.acetaminophen, antiseptic oral rinse, dextromethorphan-guaiFENesin, glycopyrrolate **OR** glycopyrrolate **OR** glycopyrrolate, haloperidol **OR** haloperidol **OR** haloperidol lactate, LORazepam **OR**  LORazepam **OR** LORazepam, ondansetron (ZOFRAN) IV, polyvinyl alcohol    Subjective:   Deborah [name] was seen and examined today.  No new complaints.   Objective:   Vitals:   05/19/21 1244 05/19/21 1651 05/19/21 1940 05/20/21 0725  BP: (!) 95/52 109/67 113/73 101/67  Pulse: 72 69 (!) 53 74  Resp: 16 (!) 22 20 20   Temp: 98.4 F (36.9 C) 97.7 F (36.5 C) 98.3 F (36.8 C) 98 F (36.7 C)  TempSrc: Oral Oral Oral Oral  SpO2: 100% 100% 92% 92%  Weight:      Height:       No intake or output data in the 24 hours ending 05/20/21 1443  Filed Weights   05/05/21 1241 05/07/21 0600  Weight: 67.9 kg 61.4 kg     Exam General exam: ill appearing , comfortable.  Respiratory system: Clear to auscultation. Respiratory effort normal. Cardiovascular system: S1 & S2 heard, RRR. No JVD,  Gastrointestinal system: Abdomen is nondistended, soft and nontender.  Central nervous system: not in distress.  Extremities: no pedal edema Skin: No rashes, lesions or ulcers Psychiatry: cannot be assessed.    Data Reviewed:  I have personally reviewed following labs and imaging studies   CBC Lab Results  Component Value Date   WBC 12.2 (H) 05/19/2021   RBC 2.25 (L) 05/19/2021   HGB 7.1 (L) 05/19/2021   HCT 23.3 (L) 05/19/2021   MCV 103.6 (H) 05/19/2021   MCH 31.6 05/19/2021   PLT 266 05/19/2021   MCHC 30.5 05/19/2021   RDW 15.4 05/19/2021   LYMPHSABS 0.6 (L) 05/05/2021   MONOABS 1.0 05/05/2021   EOSABS 0.0 05/05/2021   BASOSABS 0.0 123456     Last metabolic panel Lab Results  Component Value Date   NA 138 05/19/2021   K 4.1 05/19/2021   CL 104 05/19/2021   CO2 23 05/19/2021   BUN 32 (H) 05/19/2021   CREATININE 1.52 (H) 05/19/2021   GLUCOSE 92 05/19/2021   GFRNONAA 35 (L) 05/19/2021   GFRAA 53 (L) 02/01/2019   CALCIUM 8.2 (L) 05/19/2021   PHOS 2.6 07/22/2020   PROT 6.3 (L) 05/05/2021   ALBUMIN 3.2 (L) 05/05/2021   BILITOT 1.2 05/05/2021   ALKPHOS 65 05/05/2021   AST  40 05/05/2021   ALT 24 05/05/2021   ANIONGAP 11 05/19/2021    CBG (last 3)  Recent Labs    05/19/21 0645 05/19/21 1248 05/19/21 1656  GLUCAP 85 139* 127*       Coagulation Profile: Recent Labs  Lab 05/15/21 0323 05/16/21 0107 05/17/21 0416 05/18/21 0314 05/19/21 0104  INR 1.2 1.4* 1.8* 2.6* 2.5*  Radiology Studies: No results found.      Hosie Poisson M.D. [facility] 05/20/2021, 2:43 PM  Available via Epic secure chat 7am-7pm After 7 pm, please refer to night coverage provider listed on amion.

## 2021-05-21 DIAGNOSIS — Z66 Do not resuscitate: Secondary | ICD-10-CM

## 2021-05-21 DIAGNOSIS — Z515 Encounter for palliative care: Secondary | ICD-10-CM

## 2021-05-21 NOTE — Progress Notes (Signed)
[facility]                                                                               Deborah [name], is a 76 y.o. female, DOB - [birth year], ZYS:[id number] Admit date - 05/05/2021    Outpatient Primary MD for the patient is Georgianne Fick, MD  LOS - 16  days    Brief summary   76 year old female with prior history significant for HFrEF (4/22 EF 20-25%), AICD, HTN, congential MR s/p MVR [facility] on coumadin, HLD, DM, and anemia who presented to [facility] ER by EMS with altered mental status and weakness. Patient lives alone.  Her sister was unable to reach her, went to check on her and she was found on the bathroom floor.  EMS reports that she stated she had lost control of her bladder and sat on the floor to clean her self but was too weak to get up.  Denied falling, dizziness, syncope, etc.  Also reports not eating/ drinking for 16 hrs. Sister has noted a slow decline over the past several months.     Assessment & Plan    Assessment and Plan: * Subdural hematoma without coma- (present on admission) S/p MMA embolization on 1/28 Repeat CT head 1/30, 1/31 and 05/16/21 showed stable subdural hematomas, no evidence of acute hemorrhage Neurosurgery consulted Seizure precautions with keppra, completed on 05/18/2021. Pt contined to decline and was transitioned to comfort measures.   Severe protein-calorie malnutrition (HCC)- (present on admission) Decline in nutritional status and cognitive decline.   Failure to thrive in adult- (present on admission) Palliative care consulted and plan for comfort measures.   AKI (acute kidney injury) (HCC)- (present on admission) Baseline creatinine around 1.3-1.4. avoid nephrotoxins.    Chronic systolic CHF (congestive heart failure) (HCC)- (present on admission) Pt is on RA. Denies any sob .  Further decline, transition to comfort measures and residential hospice  HYPERTENSION, BENIGN- (present on admission) Optimal.    Pt  continues to decline, and she is transitioned to comfort measures.   No changes in mental status. She appears stable.   Malnutrition Type:  Nutrition Problem: Moderate Malnutrition Etiology: chronic illness (CHF)  Nutrition Interventions:  Interventions: Ensure Enlive (each supplement provides 350kcal and 20 grams of protein), MVI, Liberalize Diet  Estimated body mass index is 23.98 kg/m as calculated from the following:   Height as of this encounter: 5\' 3"  (1.6 m).   Weight as of this encounter: 61.4 kg.  Code Status: DNR DVT Prophylaxis:     Level of Care: Level of care: Palliative Care Family Communication: NONE AT Bedside  Disposition Plan:     Remains inpatient appropriate:  waiting or [facility].   Procedures:  None.   Consultants:   Palliative care.   Antimicrobials:   Anti-infectives (From admission, onward)    Start     Dose/Rate Route Frequency Ordered Stop   05/16/21 1200  cefTRIAXone (ROCEPHIN) 1 g in sodium chloride 0.9 % 100 mL IVPB  Status:  Discontinued        1 g 200 mL/hr over 30 Minutes Intravenous Every 24 hours 05/16/21 1045 05/16/21 1449   05/13/21  2200  cephALEXin (KEFLEX) capsule 500 mg        500 mg Oral Every 12 hours 05/13/21 1412 05/14/21 2124   05/13/21 0000  cephALEXin (KEFLEX) capsule 500 mg  Status:  Discontinued        500 mg Oral Every 6 hours 05/12/21 0959 05/13/21 1944   05/10/21 1115  cefTRIAXone (ROCEPHIN) 1 g in sodium chloride 0.9 % 100 mL IVPB  Status:  Discontinued        1 g 200 mL/hr over 30 Minutes Intravenous Every 24 hours 05/10/21 1028 05/12/21 0959        Medications  Scheduled Meds:   HYDROmorphone (DILAUDID) injection  0.25 mg Intravenous Q6H   polyethylene glycol  17 g Oral Daily   sodium chloride flush  3 mL Intravenous Q12H   Continuous Infusions: PRN Meds:.acetaminophen, antiseptic oral rinse, dextromethorphan-guaiFENesin, glycopyrrolate **OR** glycopyrrolate **OR** glycopyrrolate, haloperidol **OR**  haloperidol **OR** haloperidol lactate, LORazepam **OR** LORazepam **OR** LORazepam, ondansetron (ZOFRAN) IV, polyvinyl alcohol    Subjective:   Deborah [name] was seen and examined today.  No new complaints.   Objective:   Vitals:   05/20/21 0725 05/20/21 1946 05/20/21 1947 05/21/21 0736  BP: 101/67 129/72 129/72 (!) 130/92  Pulse: 74 87 87 74  Resp: 20  16 18   Temp: 98 F (36.7 C) 97.6 F (36.4 C) 97.6 F (36.4 C) (!) 97.5 F (36.4 C)  TempSrc: Oral Oral Oral Oral  SpO2: 92% 94% 94% 99%  Weight:      Height:        Intake/Output Summary (Last 24 hours) at 05/21/2021 1127 Last data filed at 05/21/2021 0300 Gross per 24 hour  Intake --  Output 600 ml  Net -600 ml    Filed Weights   05/05/21 1241 05/07/21 0600  Weight: 67.9 kg 61.4 kg     Exam General exam: Ill appearing, comfortable.  Respiratory system: Clear to auscultation. Respiratory effort normal. Cardiovascular system: S1 & S2 heard, RRR.  No pedal edema. Gastrointestinal system: Abdomen is nondistended, soft and nontender.  Normal bowel sounds heard. Central nervous system: alert, confused. Comfortable.  Extremities: Symmetric 5 x 5 power. Skin: No rashes, lesions or ulcers Psychiatry:  cannot be assessed.     Data Reviewed:  I have personally reviewed following labs and imaging studies   CBC Lab Results  Component Value Date   WBC 12.2 (H) 05/19/2021   RBC 2.25 (L) 05/19/2021   HGB 7.1 (L) 05/19/2021   HCT 23.3 (L) 05/19/2021   MCV 103.6 (H) 05/19/2021   MCH 31.6 05/19/2021   PLT 266 05/19/2021   MCHC 30.5 05/19/2021   RDW 15.4 05/19/2021   LYMPHSABS 0.6 (L) 05/05/2021   MONOABS 1.0 05/05/2021   EOSABS 0.0 05/05/2021   BASOSABS 0.0 05/05/2021     Last metabolic panel Lab Results  Component Value Date   NA 138 05/19/2021   K 4.1 05/19/2021   CL 104 05/19/2021   CO2 23 05/19/2021   BUN 32 (H) 05/19/2021   CREATININE 1.52 (H) 05/19/2021   GLUCOSE 92 05/19/2021   GFRNONAA 35 (L)  05/19/2021   GFRAA 53 (L) 02/01/2019   CALCIUM 8.2 (L) 05/19/2021   PHOS 2.6 07/22/2020   PROT 6.3 (L) 05/05/2021   ALBUMIN 3.2 (L) 05/05/2021   BILITOT 1.2 05/05/2021   ALKPHOS 65 05/05/2021   AST 40 05/05/2021   ALT 24 05/05/2021   ANIONGAP 11 05/19/2021    CBG (last 3)  Recent Labs  05/19/21 0645 05/19/21 1248 05/19/21 1656  GLUCAP 85 139* 127*       Coagulation Profile: Recent Labs  Lab 05/15/21 0323 05/16/21 0107 05/17/21 0416 05/18/21 0314 05/19/21 0104  INR 1.2 1.4* 1.8* 2.6* 2.5*      Radiology Studies: No results found.      Kathlen Mody M.D. [facility] 05/21/2021, 11:27 AM  Available via Epic secure chat 7am-7pm After 7 pm, please refer to night coverage provider listed on amion.

## 2021-05-21 NOTE — Progress Notes (Signed)
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 on coumadin, HLD, DM, and anemia who presented to [facility] ER on 1/25 by EMS with altered mental status and weakness.   Palliative care has been asked to get involved in the setting of declined functional state, medication non-compliance, and chronic disease burden to further discuss goals of care.  Today's Discussion (05/21/2021):  *Please note that this is a verbal dictation therefore any spelling or grammatical errors are due to the "[employer]" system interpretation.  Chart reviewed inclusive of vital signs, progress notes, laboratory results, and diagnostic images.   Remains on hydromorphone 0.25 mg every 6 hours around-the-clock.  Appears to be having good symptomatic control with this dose which will be continued.  I met with Deborah [name] at bedside this morning. She was somnolent though did open her eyes to name. She was not in distress.  Symptoms at this time appear to be well managed.  Per discussion with transitions of care team and [facility] liaison the plan will be for transfer later today as a bed is available.  No family present at bedside at the time of assessment.   Objective Assessment: Vital Signs Vitals:   05/20/21 1946 05/20/21 1947  BP: 129/72 129/72  Pulse: 87 87  Resp:  16  Temp: 97.6 F (36.4 C) 97.6 F (36.4 C)  SpO2: 94% 94%    Intake/Output Summary (Last 24 hours) at 05/21/2021 3785 Last data filed at 05/21/2021 0300 Gross per 24 hour  Intake --  Output 600 ml  Net -600 ml   Last Weight  Most recent update: 05/07/2021  6:48 AM    Weight  61.4 kg (135 lb 5.8 oz)            Gen:  Elderly frail AA F in NAD HEENT: moist mucous membranes CV: Regular rate and rhythm  PULM:  On RA breathing is even and nonlabored ABD: soft/nontender  EXT: No edema, (+)  LUB bruise Neuro: Alert and oriented x2  SUMMARY OF RECOMMENDATIONS   DNAR/DNI  Comfort focused care  Medications per [facility]  Appreciate transitions of care team helping with transfer to [facility]  Ongoing support  MDM - High -in the setting of review of opioids for end-of-life care management  Medical Decision Making:4 #/Complex Problems: 4                     Data Reviewed:    4             Management: 4 (1-Straightforward, 2-Low, 3-Moderate, 4-High) ______________________________________________________________________________________ [place] Palliative Medicine Team Team Cell Phone: [phone] Please utilize secure chat with additional questions, if there is no response within 30 minutes please call the above phone number  Palliative Medicine Team providers are available by phone from 7am to 7pm daily and can be reached through the team cell phone.  Should this patient require assistance outside of these hours, please call the patient's attending physician.

## 2021-05-21 NOTE — Care Management Important Message (Signed)
Important Message  Patient Details  Name: Deborah [name] MRN: [id number] Date of Birth: [birth year]   Medicare Important Message Given:  Yes     Sherilyn Banker 05/21/2021, 3:31 PM

## 2021-05-21 NOTE — Progress Notes (Signed)
Called report to [facility] place. Gave report to Robin. No questions at this time.

## 2021-05-21 NOTE — Discharge Summary (Signed)
Physician Discharge Summary   Patient: Deborah [name] MRN: [id number] DOB: 09/27/1945  Admit date:     05/05/2021  Discharge date: 05/21/21  Discharge Physician: Hosie Poisson   PCP: Merrilee Seashore, MD   Recommendations at discharge:  Follow up with hospice MD as needed.   Discharge Diagnoses: Principal Problem:   Subdural hematoma without coma Active Problems:   Chronic systolic CHF (congestive heart failure) (HCC)   AKI (acute kidney injury) (HCC)   Failure to thrive in adult   Severe protein-calorie malnutrition (HCC)   HYPERTENSION, BENIGN   Mitral valve disorder. (Status post mechanical valve replacement)   Lactic acidosis   Warfarin-induced coagulopathy (HCC)   Malnutrition of moderate degree     Hospital Course: 76 year old female with prior history significant for HFrEF (4/22 EF 20-25%), AICD, HTN, congential MR s/p MVR [place] on coumadin, HLD, DM, and anemia who presented to [facility] ER by EMS with altered mental status and weakness. Patient lives alone.  Her sister was unable to reach her, went to check on her and she was found on the bathroom floor.  EMS reports that she stated she had lost control of her bladder and sat on the floor to clean her self but was too weak to get up.  Denied falling, dizziness, syncope, etc.  Also reports not eating/ drinking for 16 hrs. Sister has noted a slow decline over the past several months.  Assessment and Plan: * Subdural hematoma without coma- (present on admission) S/p MMA embolization on 1/28 Repeat CT head 1/30, 1/31 and 05/16/21 showed stable subdural hematomas, no evidence of acute hemorrhage Neurosurgery consulted Seizure precautions with keppra, completed on 05/18/2021. Pt contined to decline and was transitioned to comfort measures.   Severe protein-calorie malnutrition ([place])- (present on admission) Decline in nutritional status and cognitive decline.   Failure to thrive in adult- (present on admission) Palliative care  consulted and plan for comfort measures.   AKI (acute kidney injury) ([place])- (present on admission) Baseline creatinine around 1.3-1.4. avoid nephrotoxins.    Chronic systolic CHF (congestive heart failure) ([place])- (present on admission) Pt is on RA. Denies any sob .  Further decline, transition to comfort measures and residential hospice  HYPERTENSION, BENIGN- (present on admission) Optimal.          Consultants: palliative care Procedures performed: none  Disposition: Hospice care Diet recommendation:  Discharge Diet Orders (From admission, onward)     Start     Ordered   05/21/21 0000  Diet - low sodium heart healthy        05/21/21 1156           Regular diet  DISCHARGE MEDICATION: Allergies as of 05/21/2021       Reactions   Strawberry Extract Rash   Rash & itching.        Medication List     STOP taking these medications    atorvastatin 40 MG tablet Commonly known as: LIPITOR   carvedilol 12.5 MG tablet Commonly known as: COREG   furosemide 20 MG tablet Commonly known as: Lasix   hydrALAZINE 25 MG tablet Commonly known as: APRESOLINE   HYDROcodone-acetaminophen 7.5-325 MG tablet Commonly known as: NORCO   isosorbide mononitrate 30 MG 24 hr tablet Commonly known as: IMDUR   ondansetron 4 MG disintegrating tablet Commonly known as: ZOFRAN-ODT   pantoprazole 40 MG tablet Commonly known as: PROTONIX   potassium chloride SA 20 MEQ tablet Commonly known as: K[place]-Con M20   temazepam 15  MG capsule Commonly known as: RESTORIL   tiZANidine 2 MG tablet Commonly known as: ZANAFLEX   warfarin 5 MG tablet Commonly known as: COUMADIN        Follow-up Information     Winter, Conception Oms, MD. Schedule an appointment as soon as possible for a visit in 6 week(s).   Specialty: Urology Contact information: [address] [place] [address] [phone]         Eustace Moore, MD. Schedule an appointment as soon as  possible for a visit in 1 week(s).   Specialty: Neurosurgery Contact information: [address] [place] [address] [phone]                 Discharge Exam: Danley Danker Weights   05/05/21 1241 05/07/21 0600  Weight: 67.9 kg 61.4 kg     Condition at discharge: good  The results of significant diagnostics from this hospitalization (including imaging, microbiology, ancillary and laboratory) are listed below for reference.   Imaging Studies: DG Chest 2 View  Result Date: 05/05/2021 CLINICAL DATA:  Altered mental status, weakness. EXAM: CHEST - 2 VIEW COMPARISON:  July 20, 2020 FINDINGS: Left chest AICD with lead projecting over the right ventricle. Similar cardiomegaly. Prior median sternotomy with unchanged fracture of the superior most wire. No focal airspace consolidation. No visible pleural effusion or pneumothorax. Thoracic spondylosis with degenerative changes shoulders. IMPRESSION: Unchanged cardiomegaly without overt pulmonary edema or focal airspace consolidation. Electronically Signed   By: Dahlia Bailiff M.D.   On: 05/05/2021 13:48   CT HEAD WO CONTRAST (5MM)  Result Date: 05/16/2021 CLINICAL DATA:  Follow-up subdural hematoma. EXAM: CT HEAD WITHOUT CONTRAST TECHNIQUE: Contiguous axial images were obtained from the base of the skull through the vertex without intravenous contrast. RADIATION DOSE REDUCTION: This exam was performed according to the departmental dose-optimization program which includes automated exposure control, adjustment of the mA and/or kV according to patient size and/or use of iterative reconstruction technique. COMPARISON:  05/11/2021. FINDINGS: Brain: There is redemonstration of a mixed attenuation subdural hematoma on the right measuring up to 1.5 cm, axial image 21. There is a subdural hematoma on the left measuring up to 1.2 cm, axial image 15. There is a parafalcine subdural hematoma on the left continuing along the tentorium  cerebella bilaterally common decreased in density from the prior exam. There is mass effect on the cerebral hemispheres bilaterally with midline shift to the left at the level of the septum pellucidum of approximally 3 mm. No hydrocephalus. Periventricular white matter hypodensities are noted bilaterally. Sequela of bilateral meningeal artery on X embolization is unchanged. Vascular: No hyperdense vessel or unexpected calcification. Skull: Normal. Negative for fracture or focal lesion. Sinuses/Orbits: Mucosal thickening is present in the ethmoid air cells and sphenoid sinuses bilaterally. The orbits are within normal limits. No acute orbital abnormality Other: None. IMPRESSION: 1. Mixed attenuation right subdural hematoma now measuring up to 1.5 mm, slightly increased in size from the prior exam. 2. Mixed attenuation left hematoma now measuring approximally 1.2 cm, not significantly changed from the prior exam. 3. Mixed attenuation parafalcine subdural hematoma, not significantly changed from the prior exam. 4. Mass effect on the cerebral hemispheres bilaterally with 3 mm midline shift to the left at the level of the septum pellucidum. Electronically Signed   By: Brett Fairy M.D.   On: 05/16/2021 01:04   CT HEAD WO CONTRAST (5MM)  Result Date: 05/11/2021 CLINICAL DATA:  76 year old female with bilateral subdural  hematoma status post onyx embolization of bilateral middle meningeal arteries on January 27th. EXAM: CT HEAD WITHOUT CONTRAST TECHNIQUE: Contiguous axial images were obtained from the base of the skull through the vertex without intravenous contrast. RADIATION DOSE REDUCTION: This exam was performed according to the departmental dose-optimization program which includes automated exposure control, adjustment of the mA and/or kV according to patient size and/or use of iterative reconstruction technique. COMPARISON:  Head CTs 05/10/2021 and earlier. FINDINGS: Brain: Mixed density para falcine subdural  blood, ranging from 7-10 mm thickness is stable since 05/08/2021. Increasingly isodense left side subdural hematoma, with anterior frontal convexity isodense blood such that the extra-axial collections there are difficult to measure but up to about 12 mm thickness on the right (series 2, image 19), and 7-8 mm thickness on the left. More hyperdense right subdural hematoma posteriorly and laterally, 9-10 mm thickness and not significantly changed from 05/08/2021. Similarly, stable slightly hyperdense left posterior convexity subdural blood up to 7 mm. Subsequent intracranial mass effect on the ventricles but largely without midline shift now, regression of the mild rightward midline shift since 05/08/2021. No ventriculomegaly. No IVH. Basilar cisterns are preserved and appear unchanged since 05/08/2021. Stable gray-white matter differentiation throughout the brain. No cortically based acute infarct identified. Vascular: Sequelae of bilateral middle meningeal onyx embolization with curvilinear hyperdense vessel casts along the bilateral skull base, left more so than right cerebral convexities. Skull: Stable hyperostosis frontalis. No acute osseous abnormality identified. Sinuses/Orbits: Stable paranasal sinus aeration, with mucosal thickening and bubbly opacity in the bilateral sphenoid sinuses. Tympanic cavities and mastoids remain clear. Other: No acute orbit or scalp soft tissue finding. IMPRESSION: 1. Increasing Iso-density of bilateral subdural hematomas, rendering hematoma measurement along the anterior convexities difficult. But overall: - mixed density right side subdural blood appears stable since 05/08/2021, 10-12 mm thickness. - slight regression of left side subdural, now 7-8 mm. - unchanged mixed density para falcine subdural blood up to 10 mm in thickness. 2. Continued mass effect on both hemispheres and the lateral ventricles but no significant midline shift now. Basilar cisterns remain normal. 3. No new  intracranial hemorrhage.  No ventriculomegaly. 4. Sequelae of bilateral middle meningeal artery onyx embolization. Electronically Signed   By: Genevie Ann M.D.   On: 05/11/2021 10:58   CT HEAD WO CONTRAST (5MM)  Result Date: 05/10/2021 CLINICAL DATA:  Stroke, follow up [place], s/p MMA embolization. follow up scan after starting heparin EXAM: CT HEAD WITHOUT CONTRAST TECHNIQUE: Contiguous axial images were obtained from the base of the skull through the vertex without intravenous contrast. RADIATION DOSE REDUCTION: This exam was performed according to the departmental dose-optimization program which includes automated exposure control, adjustment of the mA and/or kV according to patient size and/or use of iterative reconstruction technique. COMPARISON:  CT 05/08/2021. FINDINGS: Brain: Similar size of mixed density subdural hematomas along bilateral cerebral convexities, measuring up to approximately 1.5 cm on the left and 1.4 cm on the right. Subdural hemorrhage layers along the falx and tentorial leaflets, similar. There is similar mass effect with partial effacement of the lateral ventricles and cortical mass effect. No progressive mass effect or evidence of new/interval acute hemorrhage. No evidence of acute large vascular territory infarct, hydrocephalus, or mass lesion. Vascular: Redemonstrated sequela of middle meningeal artery embolization, including linear hyperdensity along bilateral middle meningeal grooves and extending anteriorly on the left toward the facial artery. Skull: No acute fracture. Sinuses/Orbits: Paranasal sinus mucosal thickening with air-fluid levels in the sphenoid sinuses. Other: No mastoid effusions.  IMPRESSION: Unchanged mixed density subdural hematomas along bilateral cerebral convexities with similar mass effect. Electronically Signed   By: Margaretha Sheffield M.D.   On: 05/10/2021 17:12   CT HEAD WO CONTRAST (5MM)  Result Date: 05/08/2021 CLINICAL DATA:  Stroke follow-up. Middle  meningeal artery embolization EXAM: CT HEAD WITHOUT CONTRAST TECHNIQUE: Contiguous axial images were obtained from the base of the skull through the vertex without intravenous contrast. RADIATION DOSE REDUCTION: This exam was performed according to the departmental dose-optimization program which includes automated exposure control, adjustment of the mA and/or kV according to patient size and/or use of iterative reconstruction technique. COMPARISON:  Head CT from yesterday FINDINGS: Brain: Bilateral mixed density subdural hematoma, confluent along the lateral cerebral convexities and on the left also present in the inter hemispheric fissure. No progressive bleeding. The collections continue to measure up to 14 mm in thickness on the right and 15 mm in thickness on the left. Bilateral cortical mass effect. No hydrocephalus or visible infarct Vascular: Linear high-density along the bilateral middle meningeal artery grooves. On the left there is extension towards the facial artery. Skull: Normal. Negative for fracture or focal lesion. Sinuses/Orbits: No acute finding. IMPRESSION: Unchanged mixed density subdural hematoma along the bilateral cerebral convexities with cortical mass effect. Electronically Signed   By: Jorje Guild M.D.   On: 05/08/2021 12:04   CT HEAD WO CONTRAST (5MM)  Result Date: 05/06/2021 CLINICAL DATA:  Follow-up subdural hematoma EXAM: CT HEAD WITHOUT CONTRAST TECHNIQUE: Contiguous axial images were obtained from the base of the skull through the vertex without intravenous contrast. RADIATION DOSE REDUCTION: This exam was performed according to the departmental dose-optimization program which includes automated exposure control, adjustment of the mA and/or kV according to patient size and/or use of iterative reconstruction technique. COMPARISON:  Head CT from yesterday FINDINGS: Brain: Bilateral mixed density subdural hematoma encompassing the cerebral convexities. Greater anterior  low-density component bilaterally which may be from increasing hygroma component or layering of the clot. Maximal thickness on the left is 14 mm and on the right is 13 mm. These low-density components appear somewhat increased with further bifrontal mass effect. No hydrocephalus or complicating infarct. Chronic small vessel ischemia. Vascular: Negative Skull: No acute finding Sinuses/Orbits: No acute finding IMPRESSION: Extensive bilateral subdural hematoma with mixed density. The low-density components have increased with further bifrontal mass effect as described. Electronically Signed   By: Jorje Guild M.D.   On: 05/06/2021 06:02   CT HEAD WO CONTRAST  Result Date: 05/05/2021 CLINICAL DATA:  Found down. Altered mental status. Generalized weakness for 2-3 months. EXAM: CT HEAD WITHOUT CONTRAST CT CERVICAL SPINE WITHOUT CONTRAST TECHNIQUE: Multidetector CT imaging of the head and cervical spine was performed following the standard protocol without intravenous contrast. Multiplanar CT image reconstructions of the cervical spine were also generated. RADIATION DOSE REDUCTION: This exam was performed according to the departmental dose-optimization program which includes automated exposure control, adjustment of the mA and/or kV according to patient size and/or use of iterative reconstruction technique. COMPARISON:  None. FINDINGS: CT HEAD FINDINGS Brain: There are acute hemispheric subdural hematomas bilaterally. On the right, the hematoma measures up to 14 mm in thickness over the frontal lobe. On the left, the hematoma measures up to 11 mm in thickness over the frontal lobe. There is a posterior interhemispheric component as well which measures up to 11 mm in thickness. There are high density components and fluid-fluid levels associated with the subdural hematomas. Mass effect is present on the right frontal  lobe, although no midline shift or hydrocephalus is seen. There is no evidence of intraparenchymal  hemorrhage or definite acute stroke. Vascular: Intracranial vascular calcifications. No hyperdense vessel identified. Skull: Calvarial hyperostosis without acute abnormality. Sinuses/Orbits: The visualized paranasal sinuses and mastoid air cells are clear. No orbital abnormalities are seen. Other: None. CT CERVICAL SPINE FINDINGS Alignment: Reversal of lordosis without focal angulation or listhesis. Skull base and vertebrae: No evidence of acute cervical spine fracture or traumatic subluxation. Soft tissues and spinal canal: No prevertebral fluid or swelling. No visible canal hematoma. Disc levels: Multilevel cervical spondylosis with uncinate spurring. Loss of disc height is greatest at C6-7. No large disc herniation identified. Upper chest: Clear lung apices. 1.9 cm low-density right thyroid nodule. Other: None. IMPRESSION: 1. Sizable acute hemispheric subdural hematomas bilaterally with posterior interhemispheric extension. There is mass effect on the right frontal lobe, but no midline shift or hydrocephalus. Neurosurgical consultation recommended. 2. No evidence of intraparenchymal hemorrhage or acute stroke. 3. No evidence of acute cervical spine fracture, traumatic subluxation or static signs of instability. 4. Cervical spondylosis as described. 5. 1.9 cm incidental right thyroid nodule. Recommend thyroid US unless contraindicated by patient morbidities. Reference: J Am Coll Radiol. 2015 Feb;12(2): 143-50 6. Critical Value/emergent results were called by telephone at the time of interpretation on 05/05/2021 at 2:12 pm to provider ABIGAIL HARRIS , who verbally acknowledged these results. Electronically Signed   By: Richardean Sale M.D.   On: 05/05/2021 14:17   CT CERVICAL SPINE WO CONTRAST  Result Date: 05/05/2021 CLINICAL DATA:  Found down. Altered mental status. Generalized weakness for 2-3 months. EXAM: CT HEAD WITHOUT CONTRAST CT CERVICAL SPINE WITHOUT CONTRAST TECHNIQUE: Multidetector CT imaging of the  head and cervical spine was performed following the standard protocol without intravenous contrast. Multiplanar CT image reconstructions of the cervical spine were also generated. RADIATION DOSE REDUCTION: This exam was performed according to the departmental dose-optimization program which includes automated exposure control, adjustment of the mA and/or kV according to patient size and/or use of iterative reconstruction technique. COMPARISON:  None. FINDINGS: CT HEAD FINDINGS Brain: There are acute hemispheric subdural hematomas bilaterally. On the right, the hematoma measures up to 14 mm in thickness over the frontal lobe. On the left, the hematoma measures up to 11 mm in thickness over the frontal lobe. There is a posterior interhemispheric component as well which measures up to 11 mm in thickness. There are high density components and fluid-fluid levels associated with the subdural hematomas. Mass effect is present on the right frontal lobe, although no midline shift or hydrocephalus is seen. There is no evidence of intraparenchymal hemorrhage or definite acute stroke. Vascular: Intracranial vascular calcifications. No hyperdense vessel identified. Skull: Calvarial hyperostosis without acute abnormality. Sinuses/Orbits: The visualized paranasal sinuses and mastoid air cells are clear. No orbital abnormalities are seen. Other: None. CT CERVICAL SPINE FINDINGS Alignment: Reversal of lordosis without focal angulation or listhesis. Skull base and vertebrae: No evidence of acute cervical spine fracture or traumatic subluxation. Soft tissues and spinal canal: No prevertebral fluid or swelling. No visible canal hematoma. Disc levels: Multilevel cervical spondylosis with uncinate spurring. Loss of disc height is greatest at C6-7. No large disc herniation identified. Upper chest: Clear lung apices. 1.9 cm low-density right thyroid nodule. Other: None. IMPRESSION: 1. Sizable acute hemispheric subdural hematomas  bilaterally with posterior interhemispheric extension. There is mass effect on the right frontal lobe, but no midline shift or hydrocephalus. Neurosurgical consultation recommended. 2. No evidence of intraparenchymal hemorrhage or  acute stroke. 3. No evidence of acute cervical spine fracture, traumatic subluxation or static signs of instability. 4. Cervical spondylosis as described. 5. 1.9 cm incidental right thyroid nodule. Recommend thyroid US unless contraindicated by patient morbidities. Reference: J Am Coll Radiol. 2015 Feb;12(2): 143-50 6. Critical Value/emergent results were called by telephone at the time of interpretation on 05/05/2021 at 2:12 pm to provider ABIGAIL HARRIS , who verbally acknowledged these results. Electronically Signed   By: Richardean Sale M.D.   On: 05/05/2021 14:17   CT ABDOMEN PELVIS W CONTRAST  Result Date: 05/10/2021 CLINICAL DATA:  Hematuria. EXAM: CT ABDOMEN AND PELVIS WITH CONTRAST TECHNIQUE: Multidetector CT imaging of the abdomen and pelvis was performed using the standard protocol following bolus administration of intravenous contrast. RADIATION DOSE REDUCTION: This exam was performed according to the departmental dose-optimization program which includes automated exposure control, adjustment of the mA and/or kV according to patient size and/or use of iterative reconstruction technique. CONTRAST:  91mL OMNIPAQUE IOHEXOL 300 MG/ML  SOLN COMPARISON:  Renal ultrasound 05/10/2021. CT abdomen and pelvis 09/03/2010. FINDINGS: Lower chest: The heart is enlarged. Pacemaker lead is partially visualized. Sternotomy wires are present. There is a trace right pleural effusion. Hepatobiliary: There are rounded hypodensities in the liver which are too small to characterize, likely small cysts. The liver otherwise appears within normal limits. The gallbladder is contracted. No definite biliary ductal dilatation. Pancreas: Unremarkable. No pancreatic ductal dilatation or surrounding  inflammatory changes. Spleen: Normal in size without focal abnormality. Adrenals/Urinary Tract: Adrenal glands are unremarkable. There is no hydronephrosis. There is normal renal enhancement and excretion bilaterally. There are hypodensities in both kidneys which are too small to characterize, most likely cysts. There is a 2 cm cyst in the posterior right kidney there is a single punctate nonobstructing calculus in the superior pole the left kidney. The bladder is decompressed by Foley catheter. Can not exclude diffuse bladder wall thickening. Stomach/Bowel: There is rectal wall thickening with surrounding inflammation and presacral edema. There is no evidence for bowel obstruction or free air. There is colonic diverticulosis without evidence for acute diverticulitis. Appendix is not seen. Stomach and small bowel are within normal limits. Vascular/Lymphatic: Aortic atherosclerosis. No enlarged abdominal or pelvic lymph nodes. Reproductive: Status post hysterectomy. No adnexal masses. Other: Mild body wall edema. No focal abdominal wall hernia. No ascites. Musculoskeletal: The bones are osteopenic. Multilevel degenerative changes affect the spine. IMPRESSION: 1. Rectal wall thickening with surrounding inflammation and presacral edema concerning for proctitis. Recommend clinical correlation and follow-up to exclude underlying lesion. 2. Foley catheter decompresses the urinary bladder. Can not exclude diffuse bladder wall thickening. 3. Bilateral renal cysts and hypodensities which are too small to characterize. 4. Left renal calculus. 5. Colonic diverticulosis. 6. Trace right pleural effusion. 7.  Aortic Atherosclerosis (ICD10-I70.0). Electronically Signed   By: Ronney Asters M.D.   On: 05/10/2021 17:13   IR Transcath/Emboliz  Result Date: 05/07/2021 PROCEDURE: DIAGNOSTIC CEREBRAL ANGIOGRAM ONYX EMBOLIZATION OF RIGHT MIDDLE MENINGEAL ARTERY ONYX EMBOLIZATION OF LEFT MIDDLE MENINGEAL ARTERY HISTORY: The patient is  a 76 year old woman presenting to the hospital with altered mental status with a 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 Her INR upon admission was greater than 10. 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 Patient does also have multiple underlying medical comorbidities including congestive heart failure. She was felt to be a poor surgical candidate with relatively high risk of perioperative morbidity for open surgical evacuation of her subdural  hematomas. In addition, this would require significant time off her anticoagulation increasing her risk with her prosthetic valve. Middle meningeal artery embolization on both sides was therefore requested in an attempt to avoid open surgical evacuation and minimize time off anticoagulation. ACCESS: The technical aspects of the procedure as well as its potential risks and benefits were reviewed with the patient's family in detail. These risks included but were not limited to stroke, intracranial hemorrhage, bleeding, infection, allergic reaction, damage to organs or vital structures, stroke, non-diagnostic procedure, and the catastrophic outcomes of heart attack, coma, and death. With an understanding of these risks, informed consent was obtained and witnessed. The patient was placed in the supine position on the angiography table and the skin of right groin prepped in the usual sterile fashion. The procedure was performed under general anesthesia. A 5-French sheath was introduced in the right common femoral artery using Seldinger technique. MEDICATIONS: HEPARIN: 3000 Units total. CONTRAST:  61mL OMNIPAQUE IOHEXOL 300 MG/ML  SOLNcc, Omnipaque 300 FLUOROSCOPY TIME:  FLUOROSCOPY TIME: See IR records TECHNIQUE: CATHETERS AND WIRES 5-French MPD guide catheter 180 cm 0.035" glidewire Apollo microcatheter 3 cm tip Apollo microcatheter 1.5 cm tip [employer] 10 microwire LIQUID EMBOLIC USED Onyx 34 VESSELS CATHETERIZED  Right internal carotid Right external carotid Right middle meningeal artery Left internal carotid Left external carotid Left middle meningeal artery Right common femoral VESSELS STUDIED Right internal carotid, head Right external carotid, head Right middle meningeal, microcatheter run Right external carotid, post embolization Right internal carotid, post embolization Left internal carotid, head Left external carotid, head Left middle meningeal, microcatheter run Left external carotid, post embolization Left internal carotid, post embolization Right common femoral PROCEDURAL NARRATIVE The 5-Fr MPD guide catheter was advanced over a 0.035 glidewire into the aortic arch. Initially, the right common carotid artery was catheterized. The right internal carotid artery was catheterized and cerebral angiogram taken. The right external carotid artery was also catheterized and angiogram taken. No abnormal anastomoses were noted and the decision was made to proceed with embolization of the middle meningeal artery. Under roadmap guidance, the Apollo microcatheter was advanced over the microwire into the right middle meningeal artery. The catheter was then flushed with DMSO, and under blank roadmap technique, the right middle meningeal artery was embolized with onyx 34. Once the middle meningeal artery embolization was complete and there was significant contrast reflux, the Apollo microcatheter was removed without incident. Post embolization angiogram was taken through the guide catheter in the right external carotid artery. The guide catheter was then taken into the right internal carotid artery and final angiogram was taken. The catheter was then withdrawn back down into the aortic arch and the left common carotid artery followed by the left internal carotid artery was catheterized. Angiogram was taken. The catheter was then advanced over the Glidewire into the left external carotid artery. Angiogram was taken. No abnormal  anastomoses were seen and the decision was made to proceed with embolization of the left middle meningeal artery. Under roadmap guidance, the Apollo microcatheter was advanced over the microwire into the left middle meningeal artery. The catheter was then flushed with DMSO and the left middle meningeal artery was embolized with onyx 34. Once the embolization was completed, the microcatheter was removed without incident. Final angiogram was taken through the guide catheter in the left external carotid artery. The guide catheter was then repositioned into the left internal carotid artery and final angiogram was taken. The guide catheter was then removed without incident. FINDINGS: Right internal  carotid, head: Injection reveals the presence of a widely patent ICA, M1, and A1 segments and their branches. Of note, there is a patent anterior communicating artery with perfusion of bilateral ACA territories through this right-sided injection. Of note, there is mass effect upon the right cerebral convexity away from the calvarium consistent with the known subdural hematoma. Also noted is mass effect upon the medial hemispheres away from the falx again consistent with the patient's known falcine subdural hematoma. The parenchymal and venous phases are normal. The venous sinuses are widely patent. Right external carotid, head: The visualized cranial branches of the right external carotid artery are unremarkable. Of note, there is both a middle meningeal and accessory middle meningeal artery. No clear opacification of a subdural membrane is identified. Right middle meningeal artery, head: Microcatheter injection from the right middle meningeal artery does not demonstrate any anastomoses to the underlying brain parenchyma. There may be some subtle contrast blush in the orbital frontal region possibly representing perfusion to a subdural membrane. Right external carotid, head (post embolization) The right middle meningeal artery  is occluded near its origin. Onyx cast is seen in the more proximal portions of the right middle meningeal artery. The accessory middle meningeal artery remains patent. The remainder of the cranial branches of the right external carotid artery are unremarkable. Again, no clear identification of a subdural membrane is seen. Right internal carotid, head (final control): Injection reveals the presence of a widely patent ICA that leads to a patent ACA and MCA. No thrombus is visualized. Capillary phase does not demonstrate any branch occlusions or perfusion deficits. Venous sinuses are patent. Left internal carotid, head : Injection reveals the presence of a widely patent ICA and M1 segments and their branches. The left A1 is hypoplastic. The parenchymal and venous phases are normal. The venous sinuses are widely patent. Again noted is mass effect upon the left cerebral convexity related to the patient's known subdural hematoma. Left external carotid, head: The visualized cranial branches of the left external carotid artery are unremarkable. Of note, there is a patent left middle meningeal and accessory middle meningeal arteries. There is no definitive identification of a subdural membrane. Left middle meningeal artery: Microcatheter run taken from the left middle meningeal artery does not reveal any anastomosis to the brain parenchyma. There is no clear identification of a subdural membrane. Left external carotid, head (post embolization): The left middle meningeal artery is occluded near its origin. The accessory middle meningeal artery does remain patent. On ax cast is seen in the more frontally directed branches of the left middle meningeal artery. The remainder of the cranial branches of the external carotid artery are unremarkable. Left internal carotid artery, head (final control): Injection reveals the presence of a widely patent ICA that leads to a patent MCA. No thrombus is visualized. Capillary phase does not  demonstrate any branch occlusions or perfusion deficits. Venous sinuses are patent. Right femoral: Normal vessel. No significant atherosclerotic disease. Arterial sheath in adequate position. DISPOSITION: Upon completion of the study, the femoral sheath was removed and hemostasis obtained using a 5-Fr ExoSeal closure device. Good proximal and distal lower extremity pulses were documented upon achievement of hemostasis. The procedure was well tolerated and no early complications were observed. The patient was transferred to the intensive care unit in stable hemodynamic condition. IMPRESSION: 1. Successful Onyx embolization of bilateral middle meningeal arteries for subdural hematoma The preliminary results of this procedure were shared with the patient's family. Electronically Signed   By: Nena Polio  Nundkumar   On: 05/07/2021 19:34   US RENAL  Result Date: 05/10/2021 CLINICAL DATA:  Dysuria EXAM: RENAL / URINARY TRACT ULTRASOUND COMPLETE COMPARISON:  None. FINDINGS: Right Kidney: Renal measurements: 9.1 x 4.5 x 4.8 cm = volume: 103 mL. Lobular cortical contour. Increased cortical echogenicity. No mass or hydronephrosis visualized. Simple cyst. Left Kidney: Renal measurements: 9.4 x 4.7 x 4.6 cm = volume: 105 mL. Lobular cortical contour. Increased cortical echogenicity. No mass or hydronephrosis visualized. Simple cysts. Bladder: Decompressed by Foley catheter. Other: None. IMPRESSION: 1. Increased cortical echogenicity bilaterally, as can be seen in medical renal disease. 2. No specific findings to explain hematuria. No calculi or hydronephrosis. Electronically Signed   By: Delanna Ahmadi M.D.   On: 05/10/2021 11:34   DG Chest Port 1 View  Result Date: 05/16/2021 CLINICAL DATA:  Fever EXAM: PORTABLE CHEST 1 VIEW COMPARISON:  05/05/2021 FINDINGS: Transverse diameter of heart is increased. Central pulmonary vessels are prominent. Interstitial markings in the parahilar regions are slightly prominent. There is no  focal pulmonary consolidation. There is blunting of both lateral CP angles. There is no pneumothorax. Pacemaker/defibrillator battery is seen in the left infraclavicular region. Degenerative changes are noted in both shoulders. IMPRESSION: Cardiomegaly. Central pulmonary vessels are prominent. Increased interstitial markings in the parahilar regions may suggest mild interstitial edema. Small bilateral pleural effusions are seen. Electronically Signed   By: Elmer Picker M.D.   On: 05/16/2021 12:04   DG Abd Portable 1V  Result Date: 05/17/2021 CLINICAL DATA:  Constipation. EXAM: PORTABLE ABDOMEN - 1 VIEW COMPARISON:  Abdominal x-ray 12/14/2006. FINDINGS: The bowel gas pattern is normal. There is average stool burden. No radio-opaque calculi. Degenerative changes affect the hips and spine. IMPRESSION: 1. Nonobstructive bowel gas pattern. Electronically Signed   By: Ronney Asters M.D.   On: 05/17/2021 21:20   ECHOCARDIOGRAM COMPLETE  Result Date: 05/07/2021    ECHOCARDIOGRAM REPORT   Patient Name:   VINIA MUNCK Date of Exam: 05/07/2021 Medical Rec #:  [id number]      Height:       63.0 in Accession #:    [id number]     Weight:       135.4 lb Date of Birth:  [birth year]       BSA:          1.638 m Patient Age:    76 years       BP:           104/56 mmHg Patient Gender: F              HR:           68 bpm. Exam Location:  Inpatient Procedure: 2D Echo Indications:    CHF  History:        Patient has prior history of Echocardiogram examinations, most                 recent 07/22/2020. CHF, COPD, Mitral Valve Disease; Risk                 Factors:Hypertension.  Sonographer:    Jefferey Pica Referring Phys: [phone] LINDSAY NICOLE FINCH IMPRESSIONS  1. Left ventricular ejection fraction, by estimation, is <20%. The left ventricle has severely decreased function. The left ventricle demonstrates global hypokinesis. The left ventricular internal cavity size was severely dilated. Left ventricular diastolic function  could not be evaluated.  2. Right ventricular systolic function is moderately reduced. The right ventricular size is normal. There is moderately elevated  pulmonary artery systolic pressure. The estimated right ventricular systolic pressure is [id number] mmHg.  3. Left atrial size was severely dilated.  4. Right atrial size was moderately dilated.  5. There is a trivial pericardial effusion \ posterior to the left ventricle.  6. The mitral valve has been repaired/replaced. Cannot assess for MR due to LA shadowing from the mechanical MVR mitral valve regurgitation. No evidence of mitral stenosis. The mean mitral valve gradient is 2.5 mmHg.  7. Tricuspid valve regurgitation is moderate.  8. The aortic valve is tricuspid. Aortic valve regurgitation is not visualized. Aortic valve sclerosis/calcification is present, without any evidence of aortic stenosis. Aortic valve Vmax measures 1.37 m/s.  9. The inferior vena cava is dilated in size with >50% respiratory variability, suggesting right atrial pressure of 8 mmHg. FINDINGS  Left Ventricle: Left ventricular ejection fraction, by estimation, is <20%. The left ventricle has severely decreased function. The left ventricle demonstrates global hypokinesis. The left ventricular internal cavity size was severely dilated. There is no left ventricular hypertrophy. Left ventricular diastolic function could not be evaluated due to mitral valve replacement. Left ventricular diastolic function could not be evaluated. Right Ventricle: The right ventricular size is normal. No increase in right ventricular wall thickness. Right ventricular systolic function is moderately reduced. There is moderately elevated pulmonary artery systolic pressure. The tricuspid regurgitant velocity is 3.54 m/s, and with an assumed right atrial pressure of 8 mmHg, the estimated right ventricular systolic pressure is Q000111Q mmHg. Left Atrium: Left atrial size was severely dilated. Right Atrium: Right atrial size was  moderately dilated. Pericardium: Trivial pericardial effusion is present. The pericardial effusion is posterior to the left ventricle. Mitral Valve: The mitral valve has been repaired/replaced. Cannot assess for MR due to LA shadowing from the mechanical MVR mitral valve regurgitation. There is a mechanical valve present in the mitral position. No evidence of mitral valve stenosis. MV peak gradient, 7.5 mmHg. The mean mitral valve gradient is 2.5 mmHg. Tricuspid Valve: The tricuspid valve is normal in structure. Tricuspid valve regurgitation is moderate . No evidence of tricuspid stenosis. Aortic Valve: The aortic valve is tricuspid. Aortic valve regurgitation is not visualized. Aortic valve sclerosis/calcification is present, without any evidence of aortic stenosis. Aortic valve peak gradient measures 7.5 mmHg. Pulmonic Valve: The pulmonic valve was normal in structure. Pulmonic valve regurgitation is not visualized. No evidence of pulmonic stenosis. Aorta: The aortic root is normal in size and structure. Venous: The inferior vena cava is dilated in size with greater than 50% respiratory variability, suggesting right atrial pressure of 8 mmHg. IAS/Shunts: There is right bowing of the interatrial septum, suggestive of elevated left atrial pressure. No atrial level shunt detected by color flow Doppler. Additional Comments: A device lead is visualized.  LEFT VENTRICLE PLAX 2D LVIDd:         6.00 cm LVIDs:         5.30 cm LV PW:         1.00 cm LV IVS:        0.80 cm  RIGHT VENTRICLE            IVC RV Basal diam:  2.60 cm    IVC diam: 2.70 cm RV S prime:     6.43 cm/s TAPSE (M-mode): 1.3 cm LEFT ATRIUM              Index         RIGHT ATRIUM           Index  LA diam:        3.90 cm  2.38 cm/m    RA Area:     19.60 cm LA Vol (A2C):   171.0 ml 104.39 ml/m  RA Volume:   54.30 ml  33.15 ml/m LA Vol (A4C):   101.0 ml 61.66 ml/m LA Biplane Vol: 131.0 ml 79.97 ml/m  AORTIC VALVE              PULMONIC VALVE AV Vmax:       137.00 cm/s PV Vmax:       0.59 m/s AV Peak Grad: 7.5 mmHg    PV Peak grad:  1.4 mmHg LVOT Vmax:    86.20 cm/s LVOT Vmean:   55.100 cm/s LVOT VTI:     0.166 m  AORTA Ao Asc diam: 3.00 cm MITRAL VALVE                TRICUSPID VALVE MV Area (PHT): 4.63 cm     TR Peak grad:   50.1 mmHg MV Peak grad:  7.5 mmHg     TR Vmax:        354.00 cm/s MV Mean grad:  2.5 mmHg MV Vmax:       1.37 m/s     SHUNTS MV Vmean:      67.0 cm/s    Systemic VTI: 0.17 m MV Decel Time: 164 msec MR Peak grad: 98.4 mmHg MR Vmax:      496.00 cm/s MV E velocity: 100.50 cm/s MV A velocity: 81.00 cm/s MV E/A ratio:  1.24 Fransico Him MD Electronically signed by Fransico Him MD Signature Date/Time: 05/07/2021/3:23:13 PM    Final    CUP PACEART REMOTE DEVICE CHECK  Result Date: 05/03/2021 Scheduled remote reviewed. Normal device function.  Next remote 91 days. 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 Her INR upon admission was greater than 10. 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 Patient does also have multiple underlying medical comorbidities including congestive heart failure. She was felt to be a poor surgical candidate with relatively high risk of perioperative morbidity for open surgical evacuation of her subdural hematomas. In addition, this would require significant time off her anticoagulation increasing her risk with her prosthetic valve. Middle meningeal artery embolization on both sides was therefore requested in an attempt to avoid open surgical evacuation and minimize time off anticoagulation. ACCESS: The technical aspects of the procedure as  well as its potential risks and benefits were reviewed with the patient's family in detail. These risks included but were not limited to stroke, intracranial hemorrhage, bleeding, infection, allergic reaction, damage to organs or vital structures, stroke, non-diagnostic procedure, and the catastrophic outcomes of heart attack, coma, and death. With an understanding of these risks, informed consent was obtained and witnessed. The patient was placed in the supine position on the angiography table and the skin of right groin prepped in the usual sterile fashion. The procedure was performed under general anesthesia. A 5-French sheath was introduced in the right common femoral artery using Seldinger technique. MEDICATIONS: HEPARIN: 3000 Units total. CONTRAST:  48mL OMNIPAQUE IOHEXOL 300 MG/ML  SOLNcc, Omnipaque 300 FLUOROSCOPY TIME:  FLUOROSCOPY TIME: See  IR records TECHNIQUE: CATHETERS AND WIRES 5-French MPD guide catheter 180 cm 0.035" glidewire Apollo microcatheter 3 cm tip Apollo microcatheter 1.5 cm tip [employer] 10 microwire LIQUID EMBOLIC USED Onyx 34 VESSELS CATHETERIZED Right internal carotid Right external carotid Right middle meningeal artery Left internal carotid Left external carotid Left middle meningeal artery Right common femoral VESSELS STUDIED Right internal carotid, head Right external carotid, head Right middle meningeal, microcatheter run Right external carotid, post embolization Right internal carotid, post embolization Left internal carotid, head Left external carotid, head Left middle meningeal, microcatheter run Left external carotid, post embolization Left internal carotid, post embolization Right common femoral PROCEDURAL NARRATIVE The 5-Fr MPD guide catheter was advanced over a 0.035 glidewire into the aortic arch. Initially, the right common carotid artery was catheterized. The right internal carotid artery was catheterized and cerebral angiogram taken. The right external carotid artery  was also catheterized and angiogram taken. No abnormal anastomoses were noted and the decision was made to proceed with embolization of the middle meningeal artery. Under roadmap guidance, the Apollo microcatheter was advanced over the microwire into the right middle meningeal artery. The catheter was then flushed with DMSO, and under blank roadmap technique, the right middle meningeal artery was embolized with onyx 34. Once the middle meningeal artery embolization was complete and there was significant contrast reflux, the Apollo microcatheter was removed without incident. Post embolization angiogram was taken through the guide catheter in the right external carotid artery. The guide catheter was then taken into the right internal carotid artery and final angiogram was taken. The catheter was then withdrawn back down into the aortic arch and the left common carotid artery followed by the left internal carotid artery was catheterized. Angiogram was taken. The catheter was then advanced over the Glidewire into the left external carotid artery. Angiogram was taken. No abnormal anastomoses were seen and the decision was made to proceed with embolization of the left middle meningeal artery. Under roadmap guidance, the Apollo microcatheter was advanced over the microwire into the left middle meningeal artery. The catheter was then flushed with DMSO and the left middle meningeal artery was embolized with onyx 34. Once the embolization was completed, the microcatheter was removed without incident. Final angiogram was taken through the guide catheter in the left external carotid artery. The guide catheter was then repositioned into the left internal carotid artery and final angiogram was taken. The guide catheter was then removed without incident. FINDINGS: Right internal carotid, head: Injection reveals the presence of a widely patent ICA, M1, and A1 segments and their branches. Of note, there is a patent anterior  communicating artery with perfusion of bilateral ACA territories through this right-sided injection. Of note, there is mass effect upon the right cerebral convexity away from the calvarium consistent with the known subdural hematoma. Also noted is mass effect upon the medial hemispheres away from the falx again consistent with the patient's known falcine subdural hematoma. The parenchymal and venous phases are normal. The venous sinuses are widely patent. Right external carotid, head: The visualized cranial branches of the right external carotid artery are unremarkable. Of note, there is both a middle meningeal and accessory middle meningeal artery. No clear opacification of a subdural membrane is identified. Right middle meningeal artery, head: Microcatheter injection from the right middle meningeal artery does not demonstrate any anastomoses to the underlying brain parenchyma. There may be some subtle contrast blush in the orbital frontal region possibly representing perfusion to a subdural membrane. Right external carotid, head (  post embolization) The right middle meningeal artery is occluded near its origin. Onyx cast is seen in the more proximal portions of the right middle meningeal artery. The accessory middle meningeal artery remains patent. The remainder of the cranial branches of the right external carotid artery are unremarkable. Again, no clear identification of a subdural membrane is seen. Right internal carotid, head (final control): Injection reveals the presence of a widely patent ICA that leads to a patent ACA and MCA. No thrombus is visualized. Capillary phase does not demonstrate any branch occlusions or perfusion deficits. Venous sinuses are patent. Left internal carotid, head : Injection reveals the presence of a widely patent ICA and M1 segments and their branches. The left A1 is hypoplastic. The parenchymal and venous phases are normal. The venous sinuses are widely patent. Again noted is mass  effect upon the left cerebral convexity related to the patient's known subdural hematoma. Left external carotid, head: The visualized cranial branches of the left external carotid artery are unremarkable. Of note, there is a patent left middle meningeal and accessory middle meningeal arteries. There is no definitive identification of a subdural membrane. Left middle meningeal artery: Microcatheter run taken from the left middle meningeal artery does not reveal any anastomosis to the brain parenchyma. There is no clear identification of a subdural membrane. Left external carotid, head (post embolization): The left middle meningeal artery is occluded near its origin. The accessory middle meningeal artery does remain patent. On ax cast is seen in the more frontally directed branches of the left middle meningeal artery. The remainder of the cranial branches of the external carotid artery are unremarkable. Left internal carotid artery, head (final control): Injection reveals the presence of a widely patent ICA that leads to a patent MCA. No thrombus is visualized. Capillary phase does not demonstrate any branch occlusions or perfusion deficits. Venous sinuses are patent. Right femoral: Normal vessel. No significant atherosclerotic disease. Arterial sheath in adequate position. DISPOSITION: Upon completion of the study, the femoral sheath was removed and hemostasis obtained using a 5-Fr ExoSeal closure device. Good proximal and distal lower extremity pulses were documented upon achievement of hemostasis. The procedure was well tolerated and no early complications were observed. The patient was transferred to the intensive care unit in stable hemodynamic condition. IMPRESSION: 1. Successful Onyx embolization of bilateral middle meningeal arteries for subdural hematoma The preliminary results of this procedure were shared with the patient's family. 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 Her INR upon admission was greater than 10. 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 Patient does also have multiple underlying medical comorbidities including congestive heart failure. She was felt to be a poor surgical candidate with relatively high risk of perioperative morbidity for open surgical evacuation of her subdural hematomas. In addition, this would require significant time off her anticoagulation increasing her risk with her prosthetic valve. Middle meningeal artery embolization on both sides was therefore requested in an attempt to avoid open surgical evacuation and minimize time off anticoagulation. ACCESS: The technical aspects of the procedure as well as its potential risks and benefits  were reviewed with the patient's family in detail. These risks included but were not limited to stroke, intracranial hemorrhage, bleeding, infection, allergic reaction, damage to organs or vital structures, stroke, non-diagnostic procedure, and the catastrophic outcomes of heart attack, coma, and death. With an understanding of these risks, informed consent was obtained and witnessed. The patient was placed in the supine position on the angiography table and the skin of right groin prepped in the usual sterile fashion. The procedure was performed under general anesthesia. A 5-French sheath was introduced in the right common femoral artery using Seldinger technique. MEDICATIONS: HEPARIN: 3000 Units total. CONTRAST:  21mL OMNIPAQUE IOHEXOL 300 MG/ML  SOLNcc, Omnipaque 300 FLUOROSCOPY TIME:  FLUOROSCOPY TIME:  See IR records TECHNIQUE: CATHETERS AND WIRES 5-French MPD guide catheter 180 cm 0.035" glidewire Apollo microcatheter 3 cm tip Apollo microcatheter 1.5 cm tip [employer] 10 microwire LIQUID EMBOLIC USED Onyx 34 VESSELS CATHETERIZED Right internal carotid Right external carotid Right middle meningeal artery Left internal carotid Left external carotid Left middle meningeal artery Right common femoral VESSELS STUDIED Right internal carotid, head Right external carotid, head Right middle meningeal, microcatheter run Right external carotid, post embolization Right internal carotid, post embolization Left internal carotid, head Left external carotid, head Left middle meningeal, microcatheter run Left external carotid, post embolization Left internal carotid, post embolization Right common femoral PROCEDURAL NARRATIVE The 5-Fr MPD guide catheter was advanced over a 0.035 glidewire into the aortic arch. Initially, the right common carotid artery was catheterized. The right internal carotid artery was catheterized and cerebral angiogram taken. The right external carotid artery was also catheterized and angiogram taken. No abnormal anastomoses were noted and the decision was made to proceed with embolization of the middle meningeal artery. Under roadmap guidance, the Apollo microcatheter was advanced over the microwire into the right middle meningeal artery. The catheter was then flushed with DMSO, and under blank roadmap technique, the right middle meningeal artery was embolized with onyx 34. Once the middle meningeal artery embolization was complete and there was significant contrast reflux, the Apollo microcatheter was removed without incident. Post embolization angiogram was taken through the guide catheter in the right external carotid artery. The guide catheter was then taken into the right internal carotid artery and final angiogram was taken. The catheter was then withdrawn back down into the aortic arch and the  left common carotid artery followed by the left internal carotid artery was catheterized. Angiogram was taken. The catheter was then advanced over the Glidewire into the left external carotid artery. Angiogram was taken. No abnormal anastomoses were seen and the decision was made to proceed with embolization of the left middle meningeal artery. Under roadmap guidance, the Apollo microcatheter was advanced over the microwire into the left middle meningeal artery. The catheter was then flushed with DMSO and the left middle meningeal artery was embolized with onyx 34. Once the embolization was completed, the microcatheter was removed without incident. Final angiogram was taken through the guide catheter in the left external carotid artery. The guide catheter was then repositioned into the left internal carotid artery and final angiogram was taken. The guide catheter was then removed without incident. FINDINGS: Right internal carotid, head: Injection reveals the presence of a widely patent ICA, M1, and A1 segments and their branches. Of note, there is a patent anterior communicating artery with perfusion of bilateral ACA territories through this right-sided injection. Of note, there is mass effect upon the right cerebral convexity away from the calvarium consistent with the known  subdural hematoma. Also noted is mass effect upon the medial hemispheres away from the falx again consistent with the patient's known falcine subdural hematoma. The parenchymal and venous phases are normal. The venous sinuses are widely patent. Right external carotid, head: The visualized cranial branches of the right external carotid artery are unremarkable. Of note, there is both a middle meningeal and accessory middle meningeal artery. No clear opacification of a subdural membrane is identified. Right middle meningeal artery, head: Microcatheter injection from the right middle meningeal artery does not demonstrate any anastomoses to the  underlying brain parenchyma. There may be some subtle contrast blush in the orbital frontal region possibly representing perfusion to a subdural membrane. Right external carotid, head (post embolization) The right middle meningeal artery is occluded near its origin. Onyx cast is seen in the more proximal portions of the right middle meningeal artery. The accessory middle meningeal artery remains patent. The remainder of the cranial branches of the right external carotid artery are unremarkable. Again, no clear identification of a subdural membrane is seen. Right internal carotid, head (final control): Injection reveals the presence of a widely patent ICA that leads to a patent ACA and MCA. No thrombus is visualized. Capillary phase does not demonstrate any branch occlusions or perfusion deficits. Venous sinuses are patent. Left internal carotid, head : Injection reveals the presence of a widely patent ICA and M1 segments and their branches. The left A1 is hypoplastic. The parenchymal and venous phases are normal. The venous sinuses are widely patent. Again noted is mass effect upon the left cerebral convexity related to the patient's known subdural hematoma. Left external carotid, head: The visualized cranial branches of the left external carotid artery are unremarkable. Of note, there is a patent left middle meningeal and accessory middle meningeal arteries. There is no definitive identification of a subdural membrane. Left middle meningeal artery: Microcatheter run taken from the left middle meningeal artery does not reveal any anastomosis to the brain parenchyma. There is no clear identification of a subdural membrane. Left external carotid, head (post embolization): The left middle meningeal artery is occluded near its origin. The accessory middle meningeal artery does remain patent. On ax cast is seen in the more frontally directed branches of the left middle meningeal artery. The remainder of the cranial  branches of the external carotid artery are unremarkable. Left internal carotid artery, head (final control): Injection reveals the presence of a widely patent ICA that leads to a patent MCA. No thrombus is visualized. Capillary phase does not demonstrate any branch occlusions or perfusion deficits. Venous sinuses are patent. Right femoral: Normal vessel. No significant atherosclerotic disease. Arterial sheath in adequate position. DISPOSITION: Upon completion of the study, the femoral sheath was removed and hemostasis obtained using a 5-Fr ExoSeal closure device. Good proximal and distal lower extremity pulses were documented upon achievement of hemostasis. The procedure was well tolerated and no early complications were observed. The patient was transferred to the intensive care unit in stable hemodynamic condition. IMPRESSION: 1. Successful Onyx embolization of bilateral middle meningeal arteries for subdural hematoma The preliminary results of this procedure were shared with the patient's family. 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 Her INR upon admission was greater than 10. 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 Patient does also have multiple underlying medical comorbidities including congestive heart failure. She was felt to be a poor surgical candidate with relatively high risk of perioperative morbidity for open surgical evacuation of her subdural hematomas. In addition, this would require significant time off her anticoagulation increasing her risk with  her prosthetic valve. Middle meningeal artery embolization on both sides was therefore requested in an attempt to avoid open surgical evacuation and minimize time off anticoagulation. ACCESS: The technical aspects of the procedure as well as its potential risks and benefits were reviewed with the patient's family in detail. These risks included but were not limited to stroke, intracranial hemorrhage, bleeding, infection, allergic reaction, damage to organs or vital structures, stroke, non-diagnostic procedure, and the catastrophic outcomes of heart attack, coma, and death. With an understanding of these risks, informed consent was obtained and witnessed. The patient was placed in the supine position on the angiography table and the skin of right groin prepped in the usual sterile fashion. The procedure was performed under general anesthesia. A 5-French sheath was introduced in the right common femoral artery using Seldinger technique. MEDICATIONS: HEPARIN: 3000 Units total. CONTRAST:  6mL OMNIPAQUE IOHEXOL 300 MG/ML  SOLNcc, Omnipaque 300 FLUOROSCOPY TIME:  FLUOROSCOPY TIME: See IR records TECHNIQUE: CATHETERS AND WIRES 5-French MPD guide catheter 180 cm 0.035" glidewire Apollo microcatheter 3 cm tip Apollo microcatheter 1.5 cm tip [employer] 10 microwire LIQUID EMBOLIC USED Onyx 34 VESSELS CATHETERIZED Right internal carotid Right external carotid Right middle meningeal artery Left internal carotid Left external carotid Left middle meningeal artery Right common femoral VESSELS STUDIED Right internal carotid, head Right external carotid, head Right middle meningeal, microcatheter run Right external carotid, post embolization Right internal carotid, post embolization Left internal carotid, head Left external carotid, head Left middle meningeal, microcatheter run Left external carotid, post embolization Left internal carotid, post embolization Right common femoral PROCEDURAL NARRATIVE The 5-Fr MPD guide catheter  was advanced over a 0.035 glidewire into the aortic arch. Initially, the right common carotid artery was catheterized. The right internal carotid artery was catheterized and cerebral angiogram taken. The right external carotid artery was also catheterized and angiogram taken. No abnormal anastomoses were noted and the decision was made to proceed with embolization of the middle meningeal artery. Under roadmap guidance, the Apollo microcatheter was advanced over the microwire into the right middle meningeal artery. The catheter was then flushed with DMSO, and under blank roadmap technique, the right middle meningeal artery was embolized with onyx 34. Once the middle meningeal artery embolization was complete and there was significant contrast reflux, the Apollo microcatheter was removed without incident. Post embolization angiogram was taken through the guide catheter in the right external carotid artery. The guide catheter was then taken into the right internal carotid artery and final angiogram was taken. The catheter was then withdrawn back down into the aortic arch and the left common carotid artery followed by the left internal carotid artery was catheterized. Angiogram was taken. The catheter was then advanced over the Glidewire into the left external carotid artery. Angiogram was taken. No abnormal anastomoses were seen and the decision was made to proceed with embolization of the left middle meningeal artery. Under roadmap guidance, the Apollo microcatheter was advanced over the microwire into the left middle meningeal artery. The catheter was then flushed with  DMSO and the left middle meningeal artery was embolized with onyx 34. Once the embolization was completed, the microcatheter was removed without incident. Final angiogram was taken through the guide catheter in the left external carotid artery. The guide catheter was then repositioned into the left internal carotid artery and final angiogram was taken.  The guide catheter was then removed without incident. FINDINGS: Right internal carotid, head: Injection reveals the presence of a widely patent ICA, M1, and A1 segments and their branches. Of note, there is a patent anterior communicating artery with perfusion of bilateral ACA territories through this right-sided injection. Of note, there is mass effect upon the right cerebral convexity away from the calvarium consistent with the known subdural hematoma. Also noted is mass effect upon the medial hemispheres away from the falx again consistent with the patient's known falcine subdural hematoma. The parenchymal and venous phases are normal. The venous sinuses are widely patent. Right external carotid, head: The visualized cranial branches of the right external carotid artery are unremarkable. Of note, there is both a middle meningeal and accessory middle meningeal artery. No clear opacification of a subdural membrane is identified. Right middle meningeal artery, head: Microcatheter injection from the right middle meningeal artery does not demonstrate any anastomoses to the underlying brain parenchyma. There may be some subtle contrast blush in the orbital frontal region possibly representing perfusion to a subdural membrane. Right external carotid, head (post embolization) The right middle meningeal artery is occluded near its origin. Onyx cast is seen in the more proximal portions of the right middle meningeal artery. The accessory middle meningeal artery remains patent. The remainder of the cranial branches of the right external carotid artery are unremarkable. Again, no clear identification of a subdural membrane is seen. Right internal carotid, head (final control): Injection reveals the presence of a widely patent ICA that leads to a patent ACA and MCA. No thrombus is visualized. Capillary phase does not demonstrate any branch occlusions or perfusion deficits. Venous sinuses are patent. Left internal carotid,  head : Injection reveals the presence of a widely patent ICA and M1 segments and their branches. The left A1 is hypoplastic. The parenchymal and venous phases are normal. The venous sinuses are widely patent. Again noted is mass effect upon the left cerebral convexity related to the patient's known subdural hematoma. Left external carotid, head: The visualized cranial branches of the left external carotid artery are unremarkable. Of note, there is a patent left middle meningeal and accessory middle meningeal arteries. There is no definitive identification of a subdural membrane. Left middle meningeal artery: Microcatheter run taken from the left middle meningeal artery does not reveal any anastomosis to the brain parenchyma. There is no clear identification of a subdural membrane. Left external carotid, head (post embolization): The left middle meningeal artery is occluded near its origin. The accessory middle meningeal artery does remain patent. On ax cast is seen in the more frontally directed branches of the left middle meningeal artery. The remainder of the cranial branches of the external carotid artery are unremarkable. Left internal carotid artery, head (final control): Injection reveals the presence of a widely patent ICA that leads to a patent MCA. No thrombus is visualized. Capillary phase does not demonstrate any branch occlusions or perfusion deficits. Venous sinuses are patent. Right femoral: Normal vessel. No significant atherosclerotic disease. Arterial sheath in adequate position. DISPOSITION: Upon completion of the study, the femoral sheath was removed and hemostasis obtained using a 5-Fr ExoSeal closure device. Good proximal and  distal lower extremity pulses were documented upon achievement of hemostasis. The procedure was well tolerated and no early complications were observed. The patient was transferred to the intensive care unit in stable hemodynamic condition. IMPRESSION: 1. Successful Onyx  embolization of bilateral middle meningeal arteries for subdural hematoma The preliminary results of this procedure were shared with the patient's family. Electronically Signed   By: Consuella Lose   On: 05/07/2021 19:34    Microbiology: Results for orders placed or performed during the hospital encounter of 05/05/21  Blood Cultures (routine x 2)     Status: None   Collection Time: 05/05/21  1:00 PM   Specimen: BLOOD  Result Value Ref Range Status   Specimen Description BLOOD RIGHT ANTECUBITAL  Final   Special Requests   Final    BOTTLES DRAWN AEROBIC AND ANAEROBIC Blood Culture adequate volume   Culture   Final    NO GROWTH 5 DAYS Performed at [facility], [address], [place], [place] [address]    Report Status 05/10/2021 FINAL  Final  Blood Cultures (routine x 2)     Status: None   Collection Time: 05/05/21  1:10 PM   Specimen: BLOOD RIGHT HAND  Result Value Ref Range Status   Specimen Description BLOOD RIGHT HAND  Final   Special Requests   Final    BOTTLES DRAWN AEROBIC AND ANAEROBIC Blood Culture results may not be optimal due to an inadequate volume of blood received in culture bottles   Culture   Final    NO GROWTH 5 DAYS Performed at [facility], [place] [address], [place], [place] [address]    Report Status 05/10/2021 FINAL  Final  Resp Panel by RT-PCR (Flu A&B, Covid) Nasopharyngeal Swab     Status: Abnormal   Collection Time: 05/05/21  5:06 PM   Specimen: Nasopharyngeal Swab; Nasopharyngeal(NP) swabs in vial transport medium  Result Value Ref Range Status   SARS Coronavirus 2 by RT PCR POSITIVE (A) NEGATIVE Final    Comment: (NOTE) SARS-CoV-2 target nucleic acids are DETECTED.  The SARS-CoV-2 RNA is generally detectable in upper respiratory specimens during the acute phase of infection. Positive results are indicative of the presence of the identified virus, but do not rule out bacterial infection or co-infection with other pathogens not detected  by the test. Clinical correlation with patient history and other diagnostic information is necessary to determine patient infection status. The expected result is Negative.  Fact Sheet for Patients: EntrepreneurPulse.com.au  Fact Sheet for Healthcare Providers: IncredibleEmployment.be  This test is not yet approved or cleared by the [place] FDA and  has been authorized for detection and/or diagnosis of SARS-CoV-2 by FDA under an Emergency Use Authorization (EUA).  This EUA will remain in effect (meaning this test can be used) for the duration of  the COVID-19 declaration under Section 564(b)(1) of the A ct, 21 U.S.C. section 360bbb-3(b)(1), unless the authorization is terminated or revoked sooner.     Influenza A by PCR NEGATIVE NEGATIVE Final   Influenza B by PCR NEGATIVE NEGATIVE Final    Comment: (NOTE) The Xpert Xpress SARS-CoV-2/FLU/RSV plus assay is intended as an aid in the diagnosis of influenza from Nasopharyngeal swab specimens and should not be used as a sole basis for treatment. Nasal washings and aspirates are unacceptable for Xpert Xpress SARS-CoV-2/FLU/RSV testing.  Fact Sheet for Patients: EntrepreneurPulse.com.au  Fact Sheet for Healthcare Providers: IncredibleEmployment.be  This test is not yet approved or cleared by the [place]  States FDA and has been authorized for detection and/or diagnosis of SARS-CoV-2 by FDA under an Emergency Use Authorization (EUA). This EUA will remain in effect (meaning this test can be used) for the duration of the COVID-19 declaration under Section 564(b)(1) of the Act, 21 U.S.C. section 360bbb-3(b)(1), unless the authorization is terminated or revoked.  Performed at [facility], [place] [address], [place], [place] [address]   MRSA Next Gen by PCR, Nasal     Status: None   Collection Time: 05/05/21  6:40 PM   Specimen: Nasal Mucosa; Nasal Swab   Result Value Ref Range Status   MRSA by PCR Next Gen NOT DETECTED NOT DETECTED Final    Comment: (NOTE) The GeneXpert MRSA Assay (FDA approved for NASAL specimens only), is one component of a comprehensive MRSA colonization surveillance program. It is not intended to diagnose MRSA infection nor to guide or monitor treatment for MRSA infections. Test performance is not FDA approved in patients less than 29 years old. Performed at [facility], [place] [address] [place], [place] [address]   Urine Culture     Status: Abnormal   Collection Time: 05/10/21  1:21 PM   Specimen: Urine, Catheterized  Result Value Ref Range Status   Specimen Description URINE, CATHETERIZED  Final   Special Requests   Final    NONE Performed at [facility], [place] [address], [place], [place] [address]    Culture >=100,000 COLONIES/mL PROTEUS MIRABILIS (A)  Final   Report Status 05/12/2021 FINAL  Final   Organism ID, Bacteria PROTEUS MIRABILIS (A)  Final      Susceptibility   Proteus mirabilis - MIC*    AMPICILLIN <=2 SENSITIVE Sensitive     CEFAZOLIN <=4 SENSITIVE Sensitive     CEFEPIME <=0.12 SENSITIVE Sensitive     CEFTRIAXONE <=0.25 SENSITIVE Sensitive     CIPROFLOXACIN <=0.25 SENSITIVE Sensitive     GENTAMICIN <=1 SENSITIVE Sensitive     IMIPENEM 1 SENSITIVE Sensitive     NITROFURANTOIN 128 RESISTANT Resistant     TRIMETH/SULFA <=20 SENSITIVE Sensitive     AMPICILLIN/SULBACTAM <=2 SENSITIVE Sensitive     PIP/TAZO <=4 SENSITIVE Sensitive     * >=100,000 COLONIES/mL PROTEUS MIRABILIS  Urine Culture     Status: None   Collection Time: 05/16/21  7:49 AM   Specimen: Urine, Clean Catch  Result Value Ref Range Status   Specimen Description URINE, CLEAN CATCH  Final   Special Requests NONE  Final   Culture   Final    NO GROWTH Performed at [facility], [place] [address] [place], [place] [address]    Report Status 05/17/2021 FINAL  Final  Culture, blood (routine x 2)     Status: None  (Preliminary result)   Collection Time: 05/17/21  4:18 AM   Specimen: BLOOD  Result Value Ref Range Status   Specimen Description BLOOD LEFT ANTECUBITAL  Final   Special Requests   Final    BOTTLES DRAWN AEROBIC AND ANAEROBIC Blood Culture results may not be optimal due to an excessive volume of blood received in culture bottles   Culture   Final    NO GROWTH 4 DAYS Performed at [facility], [place] [address], [place], [place] [address]    Report Status PENDING  Incomplete  Culture, blood (routine x 2)     Status: None (Preliminary result)   Collection Time: 05/17/21  4:26 AM   Specimen: BLOOD LEFT HAND  Result Value Ref Range Status   Specimen Description BLOOD LEFT HAND  Final   Special Requests   Final    BOTTLES DRAWN AEROBIC AND ANAEROBIC Blood Culture adequate volume   Culture   Final    NO GROWTH 4 DAYS Performed at [facility], [address] [place], [place] [address]    Report Status PENDING  Incomplete    Labs: CBC: Recent Labs  Lab 05/17/21 0416 05/18/21 0314 05/18/21 0723 05/18/21 1144 05/19/21 0104  WBC 14.5* 14.3* 14.5* 14.4* 12.2*  HGB 8.0* 7.8* 7.6* 8.4* 7.1*  HCT 25.7* 24.7* 24.7* 27.5* 23.3*  MCV 103.2* 102.1* 103.3* 103.8* 103.6*  PLT 288 270 285 306 [id number]   Basic Metabolic Panel: Recent Labs  Lab 05/15/21 0323 05/16/21 0107 05/17/21 0416 05/18/21 0314 05/19/21 0104  NA 138 139 135 137 138  K 3.5 3.4* 4.2 4.4 4.1  CL 104 104 101 104 104  CO2 24 25 24 25 23   GLUCOSE 110* 103* 200* 103* 92  BUN 15 14 20  29* 32*  CREATININE 0.98 1.01* 1.35* 1.47* 1.52*  CALCIUM 8.6* 8.6* 8.6* 8.6* 8.2*  MG  --  1.5* 2.0  --   --    Liver Function Tests: No results for input(s): AST, ALT, ALKPHOS, BILITOT, PROT, ALBUMIN in the last 168 hours. CBG: Recent Labs  Lab 05/18/21 1543 05/18/21 2124 05/19/21 0645 05/19/21 1248 05/19/21 1656  GLUCAP 102* 92 85 139* 127*    Discharge time spent: greater than 30 minutes.  Signed: Hosie Poisson,  MD [facility] 05/21/2021

## 2021-05-21 NOTE — TOC CM/SW Note (Signed)
HF TOC CM/CSW team has signed off and report given to Unit [facility] CM/CSW to continue to follow for placement at [facility]. [place], Heart Failure TOC CM [phone]

## 2021-05-21 NOTE — Plan of Care (Signed)
  Problem: Health Behavior/Discharge Planning: Goal: Ability to manage health-related needs will improve Outcome: Progressing   Problem: Clinical Measurements: Goal: Will remain free from infection Outcome: Progressing Goal: Diagnostic test results will improve Outcome: Progressing Goal: Respiratory complications will improve Outcome: Progressing   

## 2021-05-21 NOTE — Progress Notes (Signed)
Civil engineer, contracting [facility]) Hospital Liaison Note   Bed offered and accepted at [facility] today. Unit RN please call report to [phone] prior to patient leaving the unit.   Please send signed DNR and paperwork with patient.   Please call with any questions or concerns.   Lynder Parents [facility] Liaison [phone]

## 2021-05-22 LAB — CULTURE, BLOOD (ROUTINE X 2)
Culture: NO GROWTH
Culture: NO GROWTH
Special Requests: ADEQUATE

## 2021-06-03 ENCOUNTER — Encounter (HOSPITAL_COMMUNITY): Payer: Self-pay | Admitting: Radiology

## 2021-06-09 DEATH — deceased

## 2022-10-24 IMAGING — CT CT HEAD W/O CM
4 series · 15 of 47 positions shown, 17 images · non-contrast
Comparison: None.

CLINICAL DATA: Found down. Altered mental status. Generalized
weakness for 2-3 months.



[Series 3: head without · axial · non-contrast · 0.41mm/px · z∈[+1297,+1417]mm · 7 of 34 slices shown, 9 images]
[im 5/34  brain]
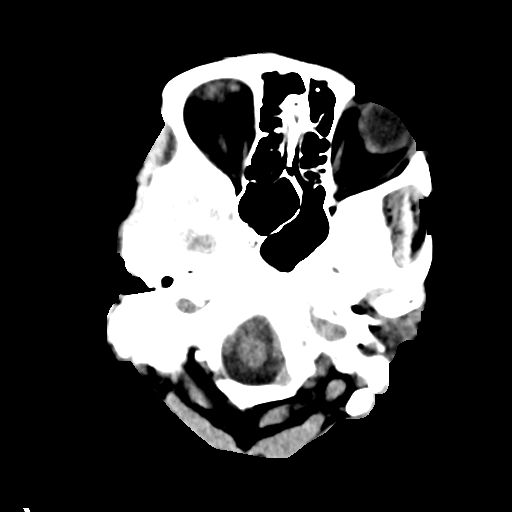
[im 5/34  bone]
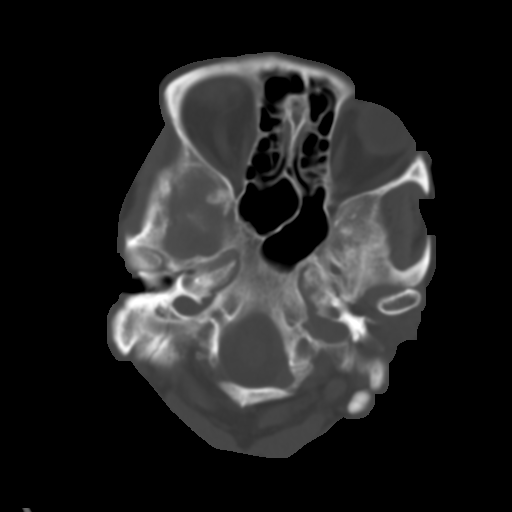
[im 9/34  brain]
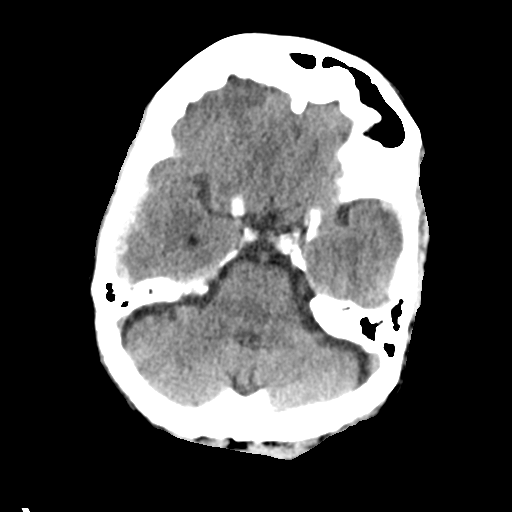
[im 13/34  brain]
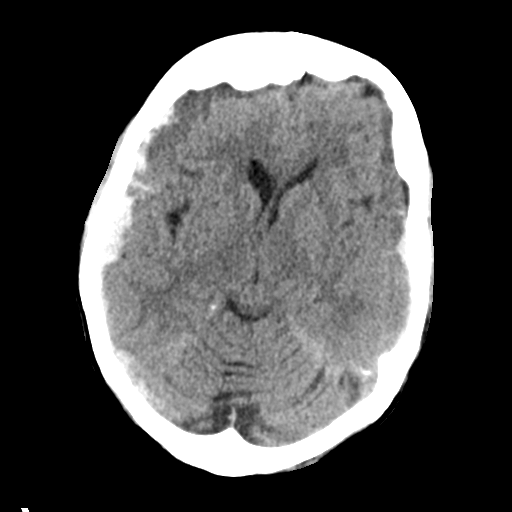
[im 17/34  brain]
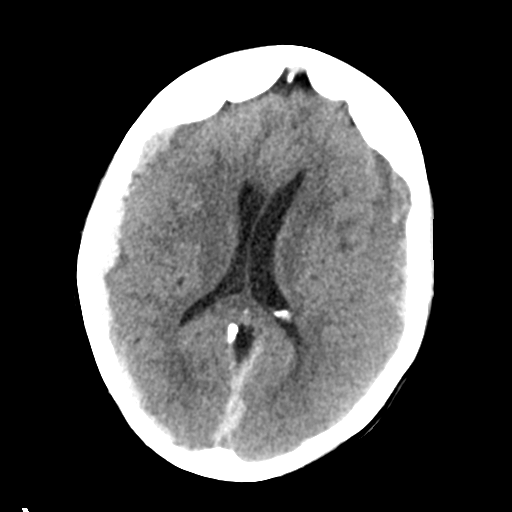
[im 21/34  brain]
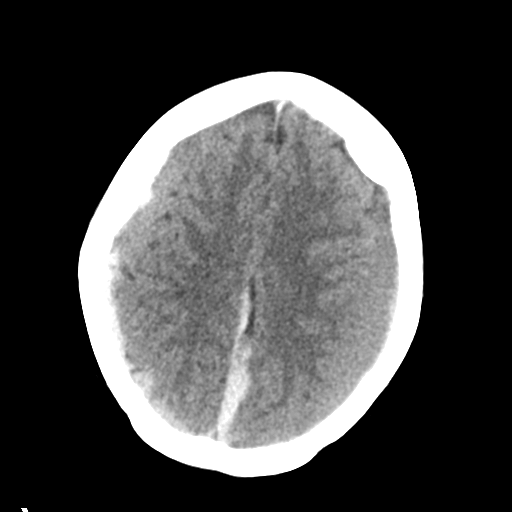
[im 21/34  bone]
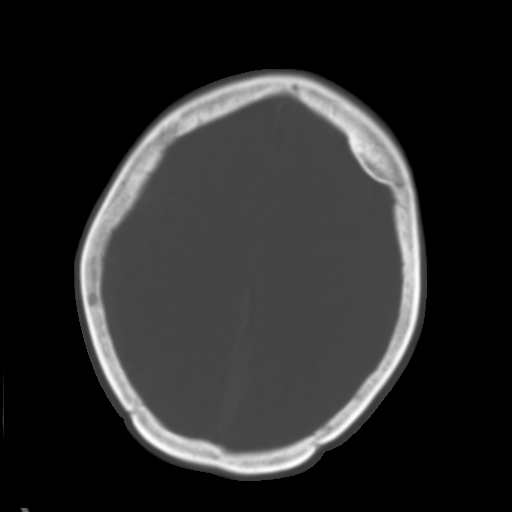
[im 25/34  brain]
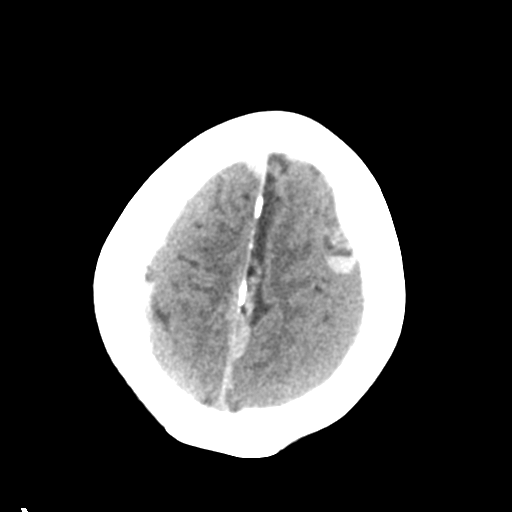
[im 29/34  brain]
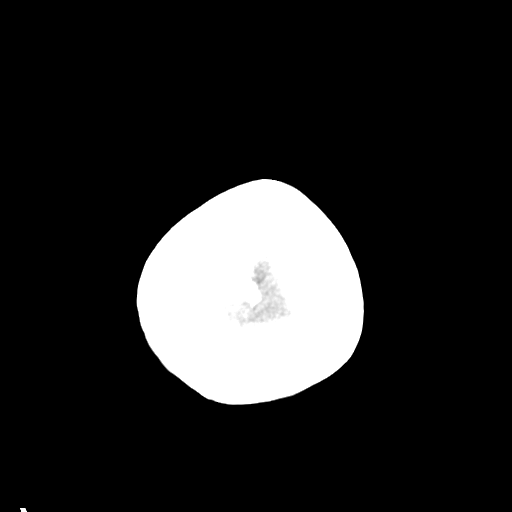

[Series 4: head bone · axial · 0.41mm/px · z∈[+1293,+1309]mm · 2 of 85 slices shown]
[im 9/85  bone]
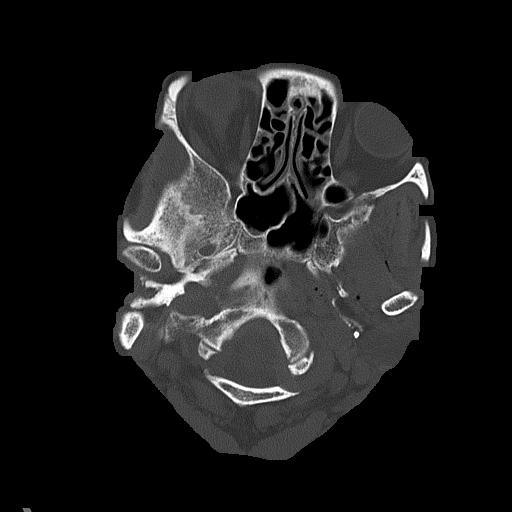
[im 17/85  bone]
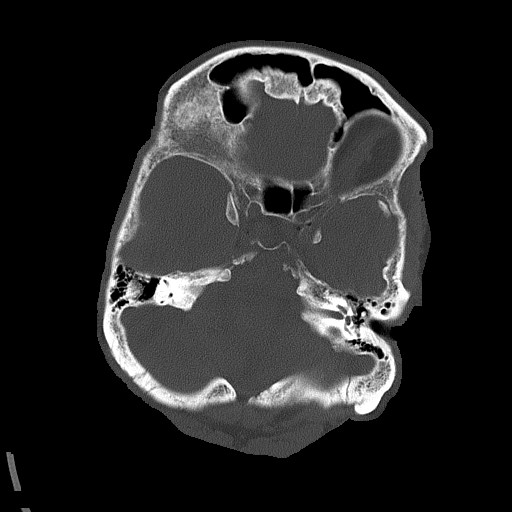

[Series 5: head without cor · coronal · non-contrast · 0.31mm/px · 3 of 69 slices shown]
[im 23/69  brain]
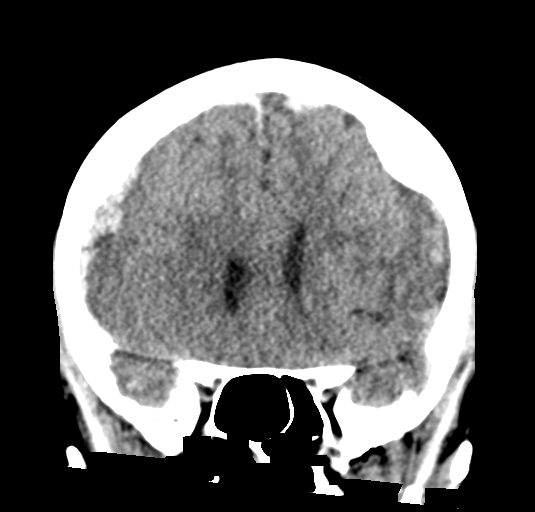
[im 31/69  brain]
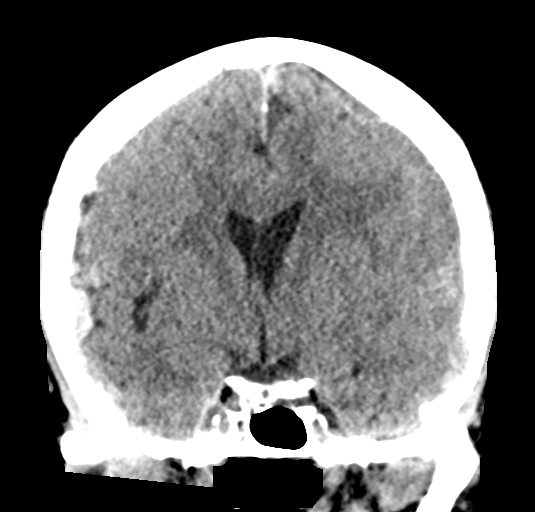
[im 38/69  brain]
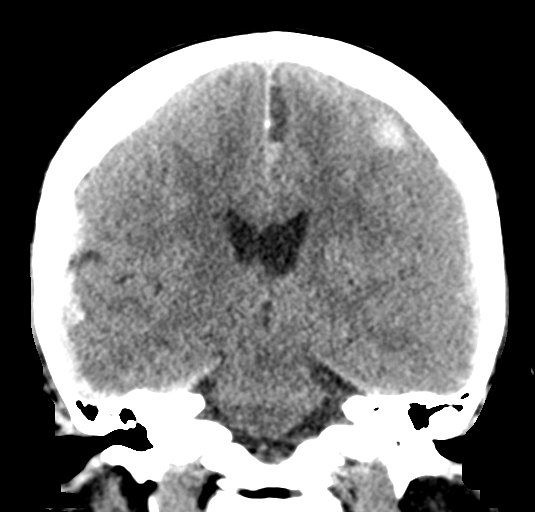

[Series 6: head without sag · sagittal · non-contrast · 0.31mm/px · 3 of 53 slices shown]
[im 18/53  brain]
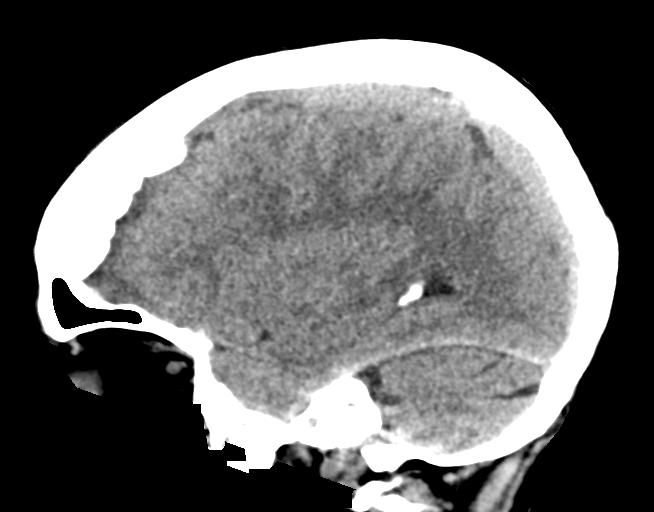
[im 27/53  brain]
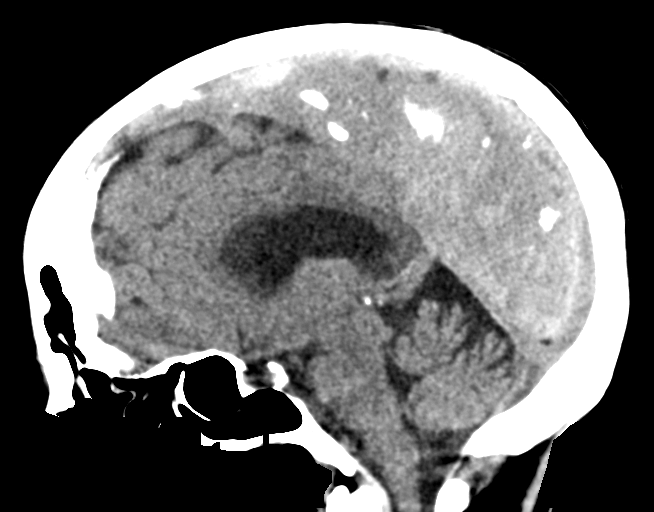
[im 35/53  brain]
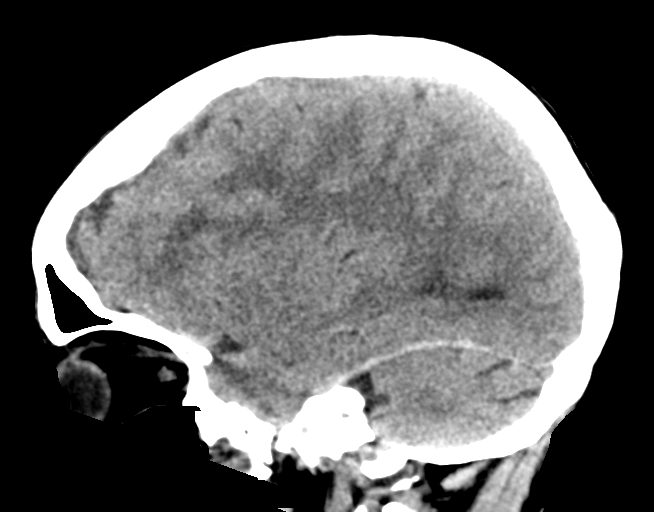

[15 of 47 positions shown; findings below may reference images not displayed]

FINDINGS: CT HEAD FINDINGS

Brain: There are acute hemispheric subdural hematomas bilaterally.
On the right, the hematoma measures up to 14 mm in thickness over
the frontal lobe. On the left, the hematoma measures up to 11 mm in
thickness over the frontal lobe. There is a posterior
interhemispheric component as well which measures up to 11 mm in
thickness. There are high density components and fluid-fluid levels
associated with the subdural hematomas. Mass effect is present on
the right frontal lobe, although no midline shift or hydrocephalus
is seen. There is no evidence of intraparenchymal hemorrhage or
definite acute stroke.

Vascular: Intracranial vascular calcifications. No hyperdense vessel
identified.

Skull: Calvarial hyperostosis without acute abnormality.

Sinuses/Orbits: The visualized paranasal sinuses and mastoid air
cells are clear. No orbital abnormalities are seen.

Other: None.

CT CERVICAL SPINE FINDINGS

Alignment: Reversal of lordosis without focal angulation or
listhesis.

Skull base and vertebrae: No evidence of acute cervical spine
fracture or traumatic subluxation.

Soft tissues and spinal canal: No prevertebral fluid or swelling. No
visible canal hematoma.

Disc levels: Multilevel cervical spondylosis with uncinate spurring.
Loss of disc height is greatest at C6-7. No large disc herniation
identified.

Upper chest: Clear lung apices. 1.9 cm low-density right thyroid
nodule.

Other: None.
IMPRESSION: 1. Sizable acute hemispheric subdural hematomas bilaterally with
posterior interhemispheric extension. There is mass effect on the
right frontal lobe, but no midline shift or hydrocephalus.
Neurosurgical consultation recommended.
2. No evidence of intraparenchymal hemorrhage or acute stroke.
3. No evidence of acute cervical spine fracture, traumatic
subluxation or static signs of instability.
4. Cervical spondylosis as described.
5. 1.9 cm incidental right thyroid nodule. Recommend thyroid US
unless contraindicated by patient morbidities.
Reference: [HOSPITAL]. [DATE]): 143-50
6. Critical Value/emergent results were called by telephone at the
time of interpretation on 05/05/2021 at [DATE] to provider GUAN
LEE OWEN , who verbally acknowledged these results.

## 2022-10-24 IMAGING — CR DG CHEST 2V
2 series · 2 of 2 positions shown · non-contrast
Comparison: July 20, 2020

CLINICAL DATA: Altered mental status, weakness.

EXAM:
CHEST - 2 VIEW

[chest lat]
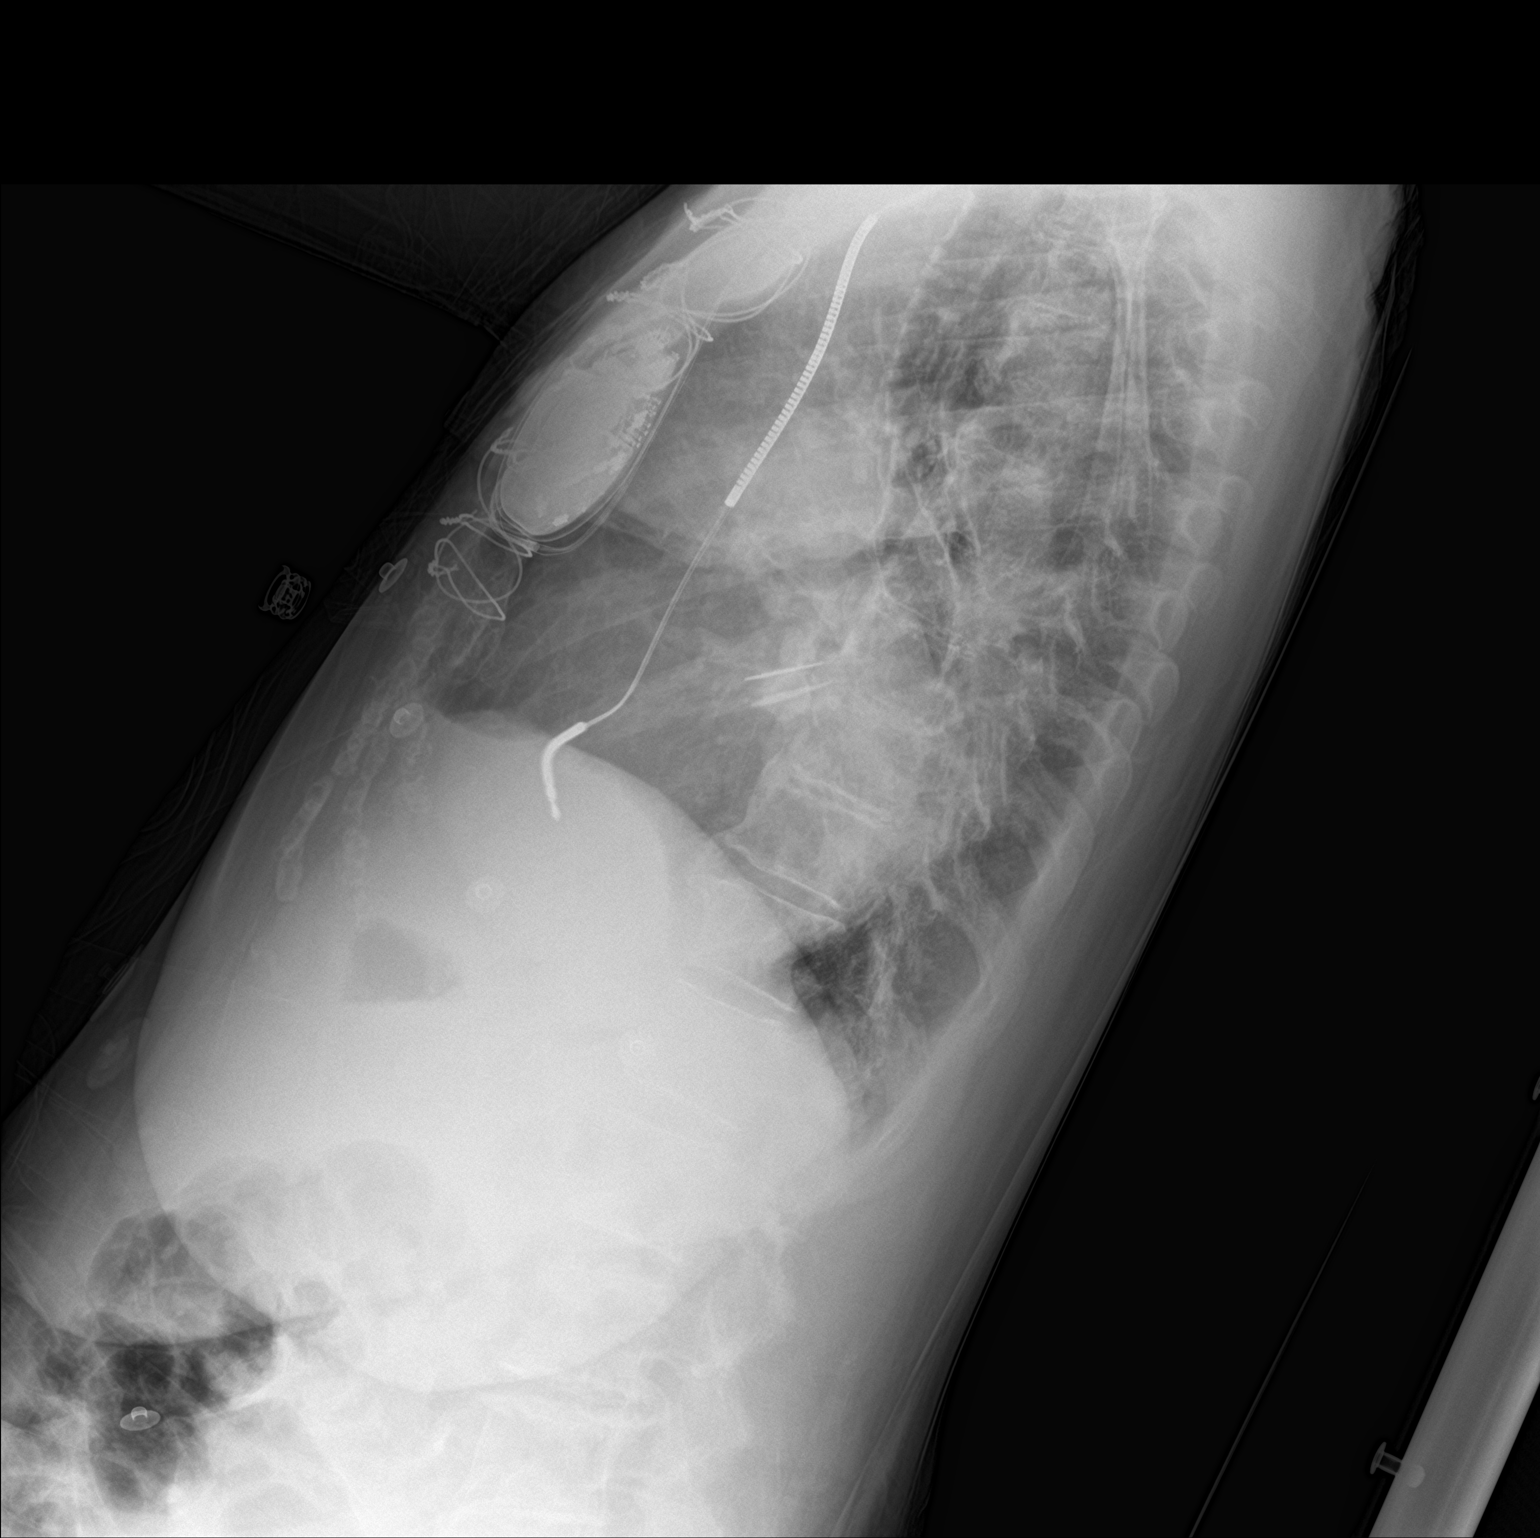

[chest ap]
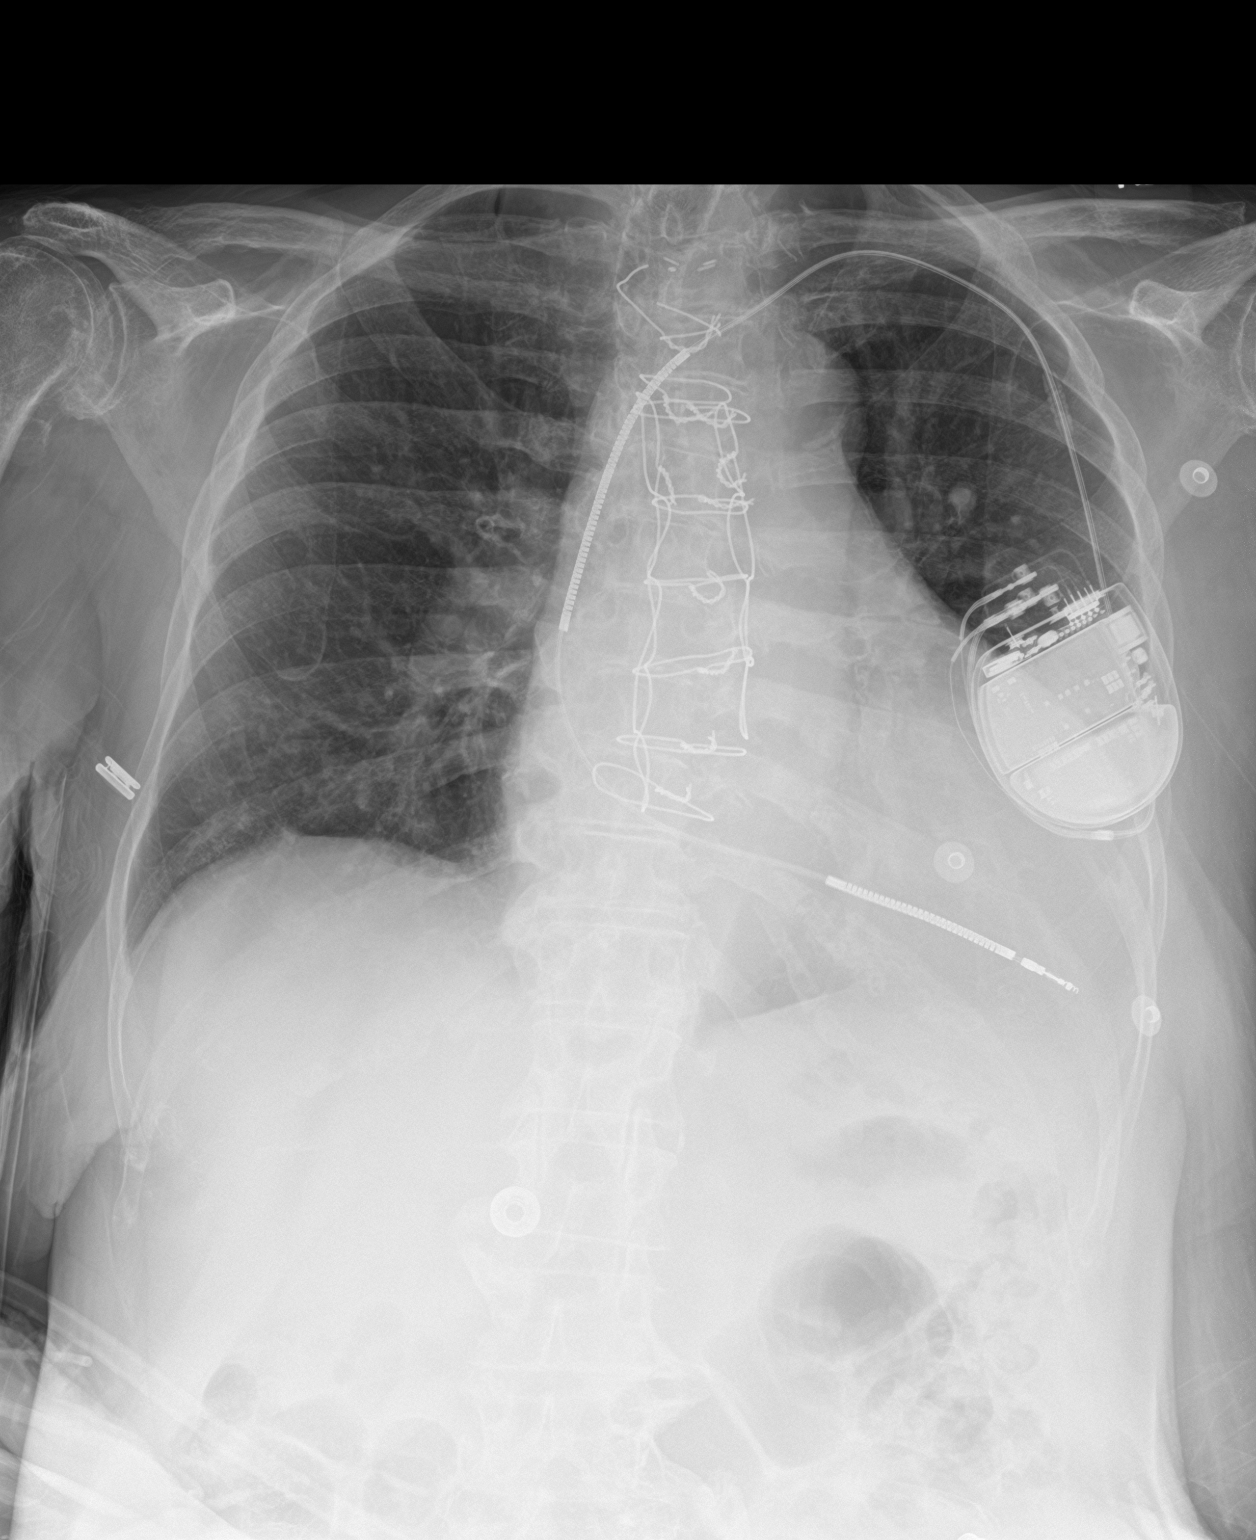

[2 of 2 positions shown; findings below may reference images not displayed]

FINDINGS: Left chest AICD with lead projecting over the right ventricle.
Similar cardiomegaly. Prior median sternotomy with unchanged
fracture of the superior most wire. No focal airspace consolidation.
No visible pleural effusion or pneumothorax. Thoracic spondylosis
with degenerative changes shoulders.
IMPRESSION: Unchanged cardiomegaly without overt pulmonary edema or focal
airspace consolidation.

## 2022-10-25 IMAGING — CT CT HEAD W/O CM
4 series · 16 of 47 positions shown, 18 images · non-contrast
Comparison: Head CT from yesterday

CLINICAL DATA: Follow-up subdural hematoma



[Series 3: head wo · axial · 0.46mm/px · z∈[-186,-66]mm · 7 of 33 slices shown, 9 images]
[im 5/33  brain]
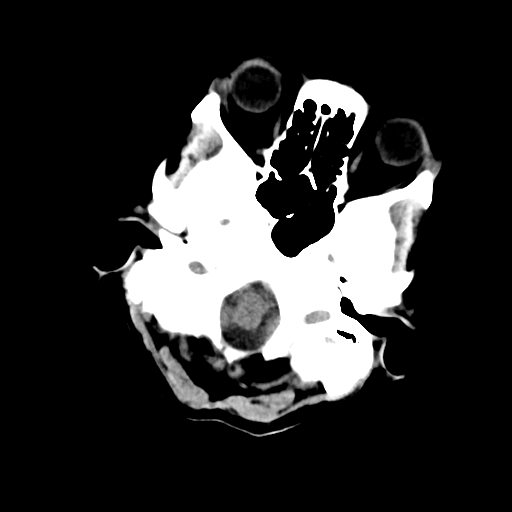
[im 5/33  bone]
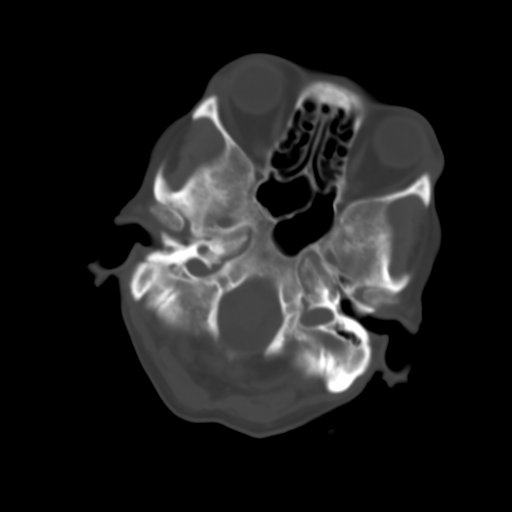
[im 9/33  brain]
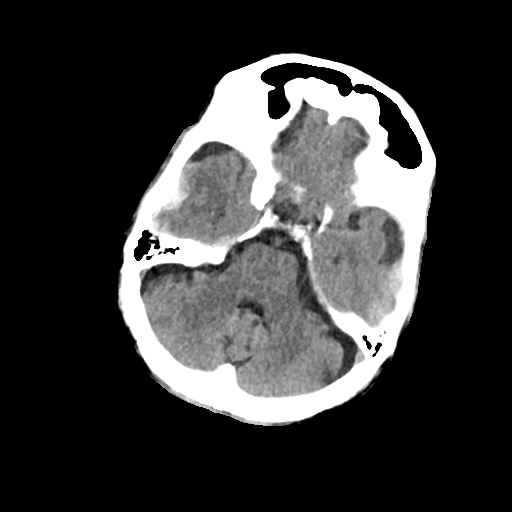
[im 13/33  brain]
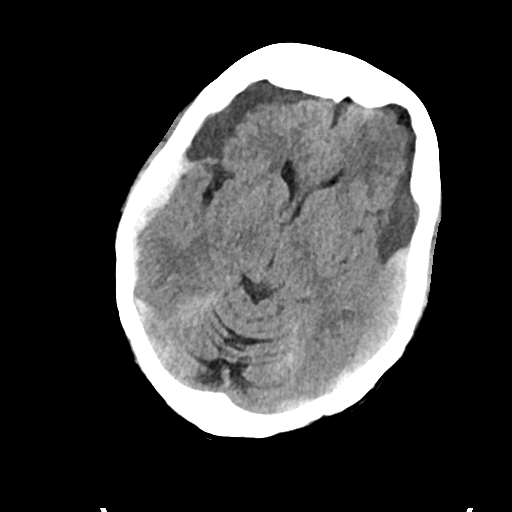
[im 17/33  brain]
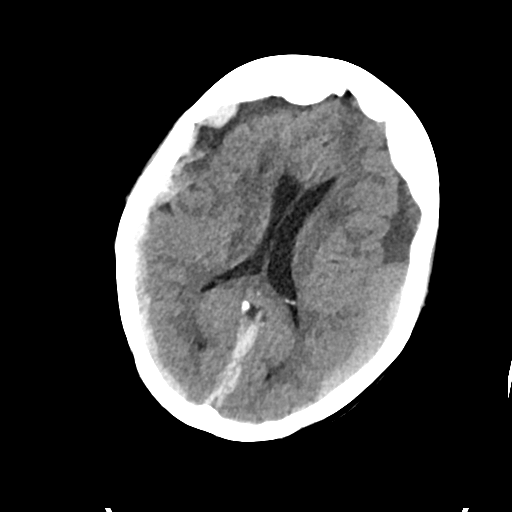
[im 21/33  brain]
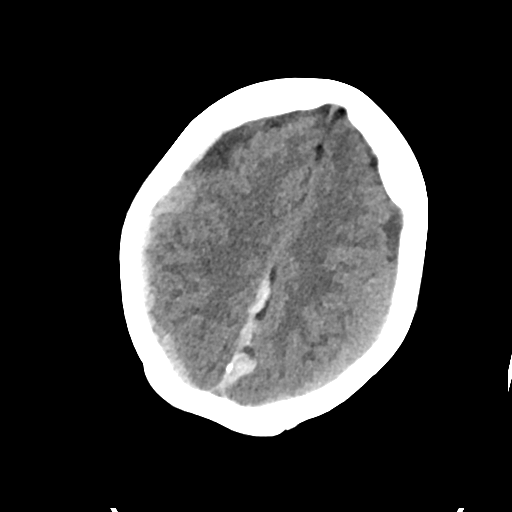
[im 21/33  bone]
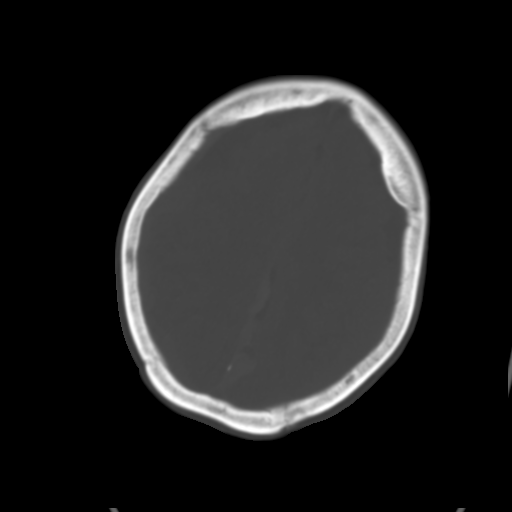
[im 25/33  brain]
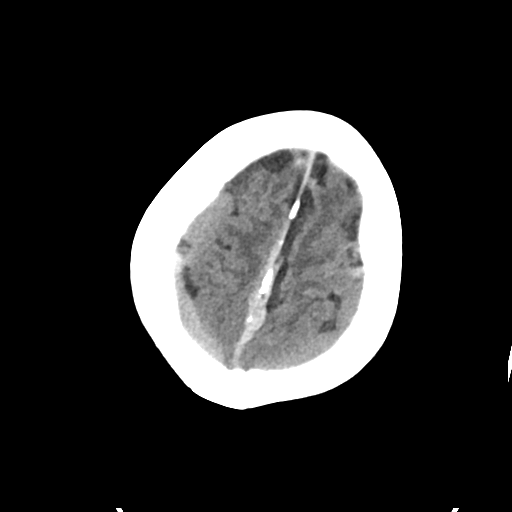
[im 29/33  brain]
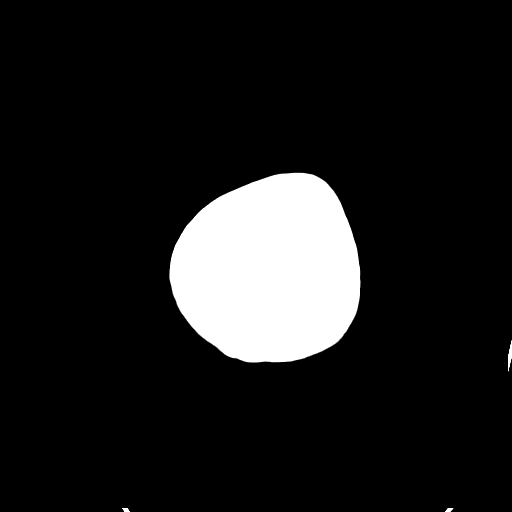

[Series 4: head bone · axial · 0.46mm/px · z∈[-190,-158]mm · 3 of 82 slices shown]
[im 9/82  bone]
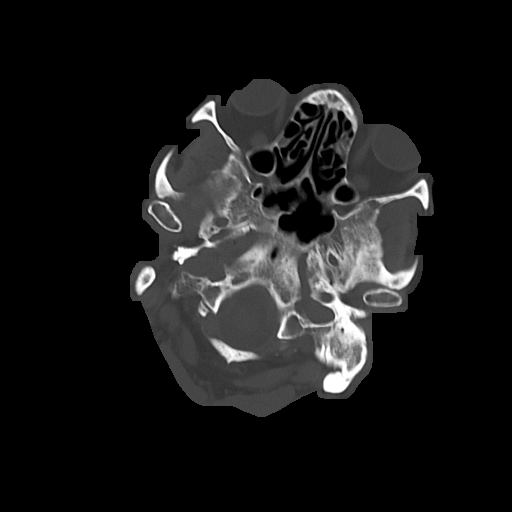
[im 17/82  bone]
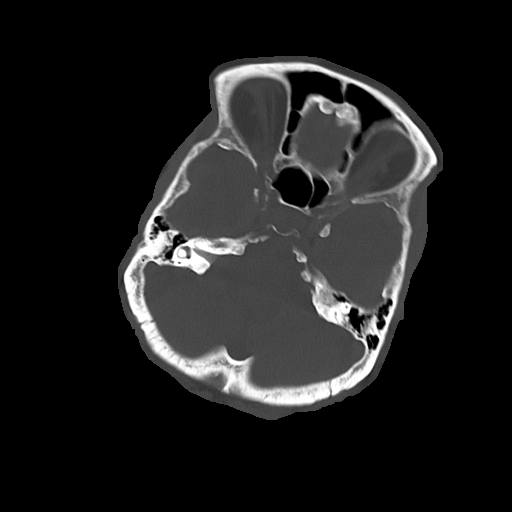
[im 25/82  bone]
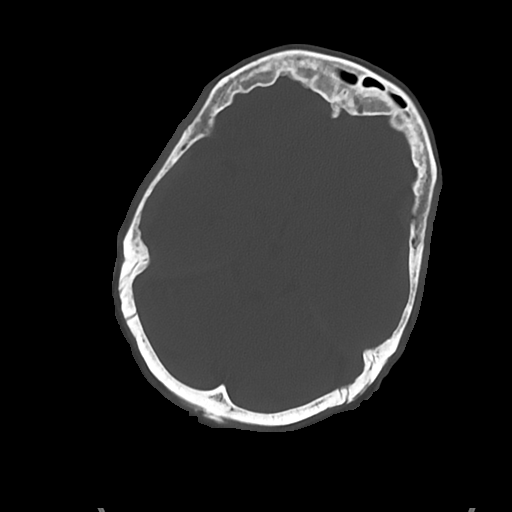

[Series 5: cor soft · coronal · 0.32mm/px · 3 of 69 slices shown]
[im 23/69  brain]
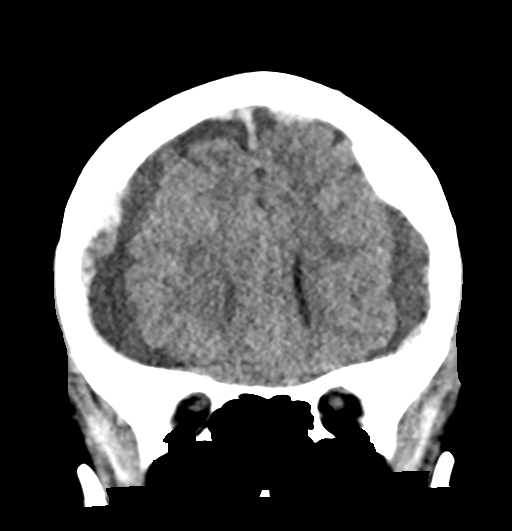
[im 31/69  brain]
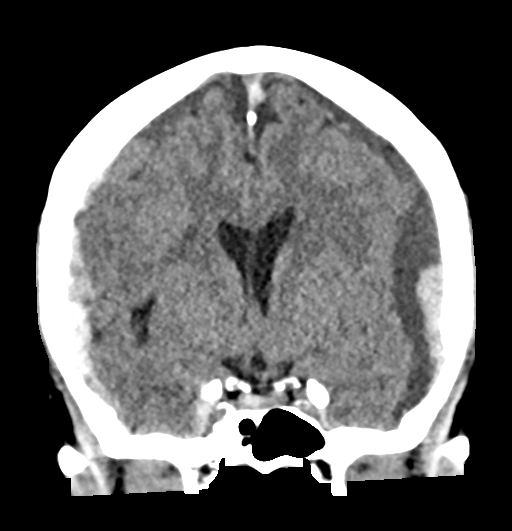
[im 38/69  brain]
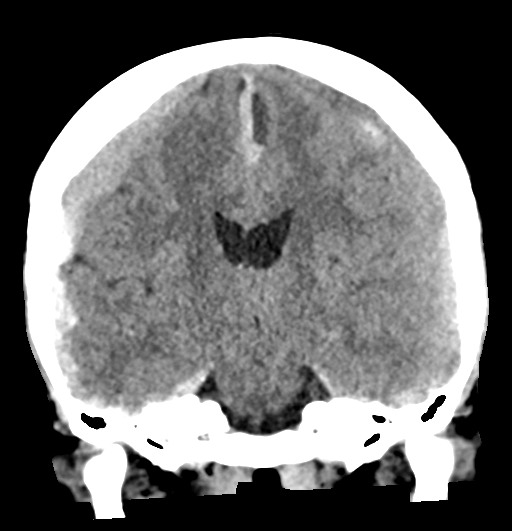

[Series 6: sag soft · sagittal · 0.33mm/px · 3 of 53 slices shown]
[im 18/53  brain]
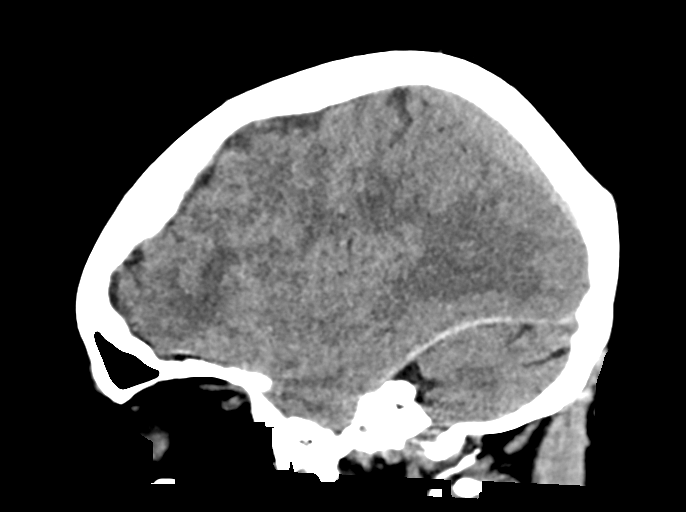
[im 27/53  brain]
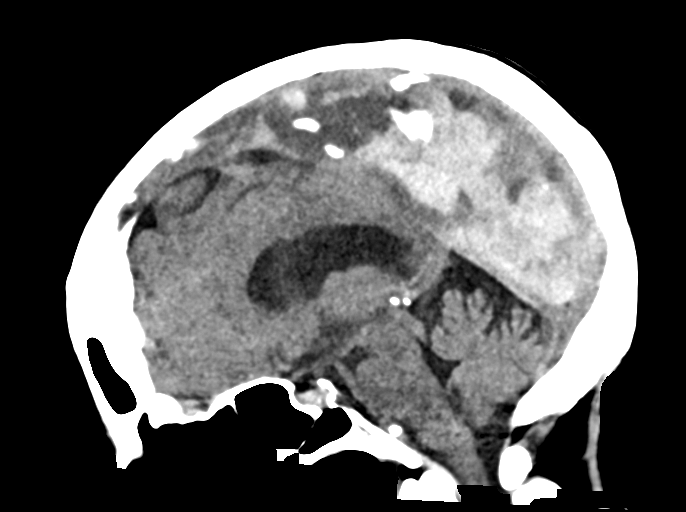
[im 35/53  brain]
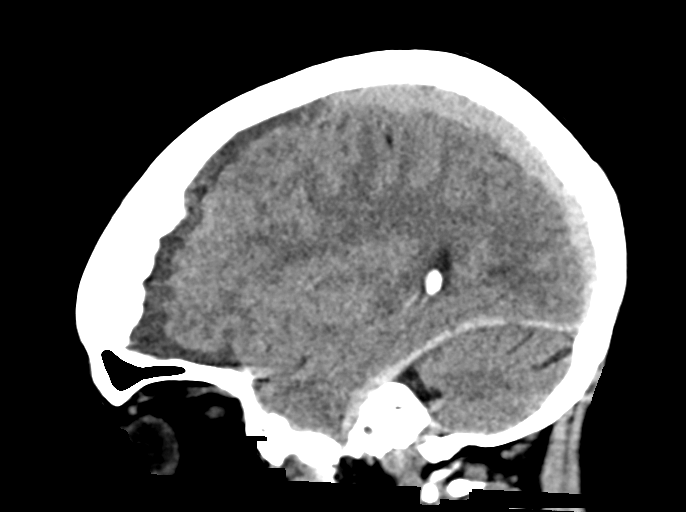

[16 of 47 positions shown; findings below may reference images not displayed]

FINDINGS: Brain: Bilateral mixed density subdural hematoma encompassing the
cerebral convexities. Greater anterior low-density component
bilaterally which may be from increasing hygroma component or
layering of the clot. Maximal thickness on the left is 14 mm and on
the right is 13 mm. These low-density components appear somewhat
increased with further bifrontal mass effect. No hydrocephalus or
complicating infarct. Chronic small vessel ischemia.

Vascular: Negative

Skull: No acute finding

Sinuses/Orbits: No acute finding
IMPRESSION: Extensive bilateral subdural hematoma with mixed density. The
low-density components have increased with further bifrontal mass
effect as described.
